# Patient Record
Sex: Female | Born: 1975 | Race: Black or African American | Hispanic: No | Marital: Married | Smoking: Former smoker
Health system: Southern US, Community
[De-identification: ages and names within clinical notes are randomized; demographics above are authoritative.]

## PROBLEM LIST (undated history)

## (undated) DIAGNOSIS — J386 Stenosis of larynx: Secondary | ICD-10-CM

## (undated) DIAGNOSIS — Z930 Tracheostomy status: Secondary | ICD-10-CM

## (undated) DIAGNOSIS — E079 Disorder of thyroid, unspecified: Secondary | ICD-10-CM

## (undated) DIAGNOSIS — D509 Iron deficiency anemia, unspecified: Secondary | ICD-10-CM

## (undated) DIAGNOSIS — D649 Anemia, unspecified: Secondary | ICD-10-CM

## (undated) DIAGNOSIS — E039 Hypothyroidism, unspecified: Secondary | ICD-10-CM

## (undated) DIAGNOSIS — J398 Other specified diseases of upper respiratory tract: Secondary | ICD-10-CM

## (undated) DIAGNOSIS — J45909 Unspecified asthma, uncomplicated: Secondary | ICD-10-CM

## (undated) DIAGNOSIS — E669 Obesity, unspecified: Secondary | ICD-10-CM

## (undated) DIAGNOSIS — C539 Malignant neoplasm of cervix uteri, unspecified: Secondary | ICD-10-CM

## (undated) HISTORY — PX: CARDIAC CATHETERIZATION: SHX172

## (undated) HISTORY — PX: LEG SURGERY: SHX1003

## (undated) HISTORY — PX: CHOLECYSTECTOMY: SHX55

## (undated) HISTORY — PX: CERVICAL BIOPSY  W/ LOOP ELECTRODE EXCISION: SUR135

## (undated) HISTORY — DX: Malignant neoplasm of cervix uteri, unspecified: C53.9

## (undated) HISTORY — PX: TUBAL LIGATION: SHX77

## (undated) NOTE — *Deleted (*Deleted)
PMR Admission Coordinator Pre-Admission Assessment  Patient: Kathy Vega is an 60 y.o., female MRN: [id number] DOB: 21-Sep-1976 Height: 5' 9.13" (175.6 cm) Weight: 110.5 kg  Insurance Information HMO:   PPO: Yes     PCP:       IPA:       80/20:       OTHER:  Group # [id number] PRIMARY: [place] medicaid/ BCBS Healthy Blue      Policy#: [id number]      Subscriber: patient CM Name: Raynelle Fanning      Phone#:      Fax#: [phone] Pre-Cert#: [id number] on 09/17/20 for 7 days with update due in 7 days      Employer: [employer] Benefits:  Phone #:       Name: Availity portal Eff. Date: 05/28/2020     Deduct: $0      Out of Pocket Max: $0      Life Max: N/A CIR: $0 co-pay      SNF: $0 do-pay Outpatient:       Co-Pay: $3 Home Health: medical necessity      Co-Pay: $0 co-pay DME: medical necessity     Co-Pay: $0 co-pay Providers: in network  SECONDARY:        Policy#:       Phone#:    Artist:        Phone#:    The Data processing manager" for patients in Inpatient Rehabilitation Facilities with attached "Privacy Act Statement-Health Care Records" was provided and verbally reviewed with: N/A  Emergency Contact Information Contact Information    Name Relation Home Work Mobile   Salmon R Spouse   [phone]   akya, fiorello Daughter   [phone]   Carron, Mcmurry Mother   [phone]      Current Medical History  Patient Admitting Diagnosis: Debility post Covid 19 PNA  History of Present Illness: A 53 yo female with a PMH of Hypothyroidism and Anemia.  She presented to [facility] ER on 09/16 with c/o shortness of breath, headache, and confusion.  Upon arrival to the ER pts O2 sats were in the 20's she was placed on 5L of O2 via nasal canula, however her O2 sats only increased to 29%.  She was diagnosed with COVID-19 6 days prior to ER presentation, and she is unvaccinated.  She was placed on NRB and heated HFNC with increase in O2 sats to 93-95%.  Pt also noted to be  confused and uncooperative likely secondary to taking a significant amount of cough syrup with codeine at home.  CT Head negative.  Lab results revealed K+ 3.4, glucose 139, creatinine 1.77, AST 333, ALT 108, troponin 40, crp 12.9, lactic acid 2.0, hgb 7.3, lactic acid 2.0, pct 0.65, d-dimer 1,365.88, abg 7.43/pCO2 47/pO2 54/acid-base excess 6.2, bicarb 31.2.  COVID-19 results pending, however CXR revealed bilateral infiltrates.  PCCM team contacted for ICU admission.  Was diagnosed with COVID-19 pneumonia and ARDS requiring intubation and mechanical ventilation. She is currently requiring trach and is medically stable for acute rehab. Currently with trach collar and unable to phonate. Physical Medicine & Rehabilitation was consulted to assess candidacy for CIR.   Patient's medical record from [facility] has been reviewed by the rehabilitation admission coordinator and physician.  Past Medical History  Past Medical History:  Diagnosis Date  . Anemia   . Thyroid disease     Family History   family history is not on file.  Prior Rehab/Hospitalizations Has the patient  had prior rehab or hospitalizations prior to admission? No  Has the patient had major surgery during 100 days prior to admission? Yes   Current Medications  Current Facility-Administered Medications:  .  0.9 %  sodium chloride infusion, 250 mL, Intravenous, Continuous, Erin Fulling, MD, Stopped at 09/07/20 0725 .  enoxaparin (LOVENOX) injection 55 mg, 0.5 mg/kg, Subcutaneous, Q24H, Albina Billet, RPH, 55 mg at 09/16/20 1545 .  feeding supplement (BOOST / RESOURCE BREEZE) liquid 1 Container, 1 Container, Oral, TID BM, Lynn Ito, MD, 1 Container at 09/15/20 1502 .  fluconazole (DIFLUCAN) tablet 100 mg, 100 mg, Oral, Q24H, Shanlever, Charmayne Sheer, RPH, 100 mg at 09/16/20 1654 .  insulin aspart (novoLOG) injection 0-20 Units, 0-20 Units, Subcutaneous, TID AC & HS, Amery, Sahar, MD .  MEDLINE mouth rinse,  15 mL, Mouth Rinse, q12n4p, Karna Christmas, Fuad, MD, 15 mL at 09/16/20 1740 .  nystatin (MYCOSTATIN) 100000 UNIT/ML suspension 500,000 Units, 5 mL, Oral, QID, Karna Christmas, Fuad, MD, 500,000 Units at 09/17/20 0849 .  ondansetron (ZOFRAN) tablet 4 mg, 4 mg, Oral, Q6H PRN **OR** ondansetron (ZOFRAN) injection 4 mg, 4 mg, Intravenous, Q6H PRN, Albina Billet, RPH .  pantoprazole (PROTONIX) EC tablet 40 mg, 40 mg, Oral, BID AC, Albina Billet, RPH, 40 mg at 09/17/20 0849 .  simethicone (MYLICON) 40 MG/0.6ML suspension 40 mg, 40 mg, Oral, Q6H PRN, Shanlever, Charmayne Sheer, RPH .  traZODone (DESYREL) tablet 100 mg, 100 mg, Oral, QHS, Shanlever, Charmayne Sheer, RPH, 100 mg at 09/16/20 2254  Patients Current Diet:  Diet Order            DIET DYS 3 Room service appropriate? Yes with Assist; Fluid consistency: Thin  Diet effective now                 Precautions / Restrictions Precautions Precautions: Fall Precaution Comments: Trach (trach collar); HOB >30 degrees; Dobhoff Restrictions Weight Bearing Restrictions: No   Has the patient had 2 or more falls or a fall with injury in the past year? No  Prior Activity Level Community (5-7x/wk): Went out daily as needed, was independent  Prior Functional Level Self Care: Did the patient need help bathing, dressing, using the toilet or eating? Independent  Indoor Mobility: Did the patient need assistance with walking from room to room (with or without device)? Independent  Stairs: Did the patient need assistance with internal or external stairs (with or without device)? Independent  Functional Cognition: Did the patient need help planning regular tasks such as shopping or remembering to take medications? Independent  Home Assistive Devices / Equipment Home Equipment: None  Prior Device Use: Indicate devices/aids used by the patient prior to current illness, exacerbation or injury? None of the above  Current Functional Level Cognition  Overall  Cognitive Status: Within Functional Limits for tasks assessed Orientation Level: Oriented X4 General Comments: Pt is able to communicate via white board and mouthing words. gives thumbs up/down appropriately    Extremity Assessment (includes Sensation/Coordination)  Upper Extremity Assessment: Generalized weakness  Lower Extremity Assessment: Defer to PT evaluation RLE Deficits / Details: hip flexion and knee flexion/extension 4/5; DF 4+/5; at least 3/5 PF LLE Deficits / Details: hip flexion and knee flexion/extension 4+/5; DF 4+/5; at least 3/5 PF    ADLs  Overall ADL's : Needs assistance/impaired Eating/Feeding: Supervision/ safety, Sitting Grooming: Wash/dry hands, Wash/dry face, Sitting, Set up, Supervision/safety Upper Body Bathing: Minimal assistance, Standing Upper Body Dressing : Moderate assistance, Bed level Lower Body Dressing: Maximal assistance,  Total assistance, Bed level General ADL Comments: Pt with gross strengh of 3-/5 throughout but fatigues quickly with self are tasks    Mobility  Overal bed mobility: Independent Bed Mobility: Supine to Sit, Sit to Supine Rolling: Mod assist Supine to sit: Supervision, HOB elevated Sit to supine: Supervision, HOB elevated General bed mobility comments: pt has just returned to bed from ZI'ly getting on/off BSc without assistances. discussed importance of calling for assistance for safety    Transfers  Overall transfer level: Needs assistance Equipment used: None Transfers: Sit to/from Stand Sit to Stand: Min guard Stand pivot transfers: Min assist General transfer comment: CGA for safety. vcs for improved technique and fwd wt shift. stood from bed at lowest height    Ambulation / Gait / Stairs / Wheelchair Mobility  Ambulation/Gait Ambulation/Gait assistance: Land (Feet): 200 Feet Assistive device: None Gait Pattern/deviations: Narrow base of support, Shuffle, Drifts right/left General Gait Details: pt  did have one occasuion of LOB with intervention to prevent fall. CGA throughout mostly with slow cadenace  Gait velocity: decreased    Posture / Balance Dynamic Sitting Balance Sitting balance - Comments: no LOB with close supervision Balance Overall balance assessment: Needs assistance Sitting-balance support: No upper extremity supported, Feet supported Sitting balance-Leahy Scale: Good Sitting balance - Comments: no LOB with close supervision Postural control: Posterior lean Standing balance support: No upper extremity supported, During functional activity Standing balance-Leahy Scale: Fair Standing balance comment: pt does have unsteadiness with dynamic activity     Special needs/care consideration Continuous Drip IV  0.9% NS KVO, Oxygen Trach collar, Trach size #6 extra long cuffed shiley trach, Skin Breakdown on bottom with dressing in place and Designated visitor Husband/daughters   Previous Home Environment (from acute therapy documentation) Living Arrangements: Spouse/significant other, Children (23,17,16 year olds) Available Help at Discharge: Family Type of Home: House Home Layout: One level Home Access: Level entry Bathroom Shower/Tub: Health visitor: Standard  Discharge Living Setting Plans for Discharge Living Setting: Patient's home, House, Lives with (comment) (LIves with spouse and children) Type of Home at Discharge: House Discharge Home Layout: One level Discharge Home Access: Level entry Discharge Bathroom Shower/Tub: Walk-in shower Discharge Bathroom Toilet: Standard Discharge Bathroom Accessibility: Yes How Accessible: Accessible via walker Does the patient have any problems obtaining your medications?: No  Social/Family/Support Systems Patient Roles: Spouse, Parent (Has husband and children) Contact Information: Lewanda Rife - spouse Anticipated Caregiver: husband Anticipated Caregiver's Contact Information: Lewanda Rife - spouse  - [phone] Ability/Limitations of Caregiver: Husband works.  He can get his 18yo or 21 yo daughters to stay with patient if needed. Caregiver Availability: Other (Comment) (Can get daughters to stay with patient at discharge.  ) Discharge Plan Discussed with Primary Caregiver: Yes Is Caregiver In Agreement with Plan?: Yes Does Caregiver/Family have Issues with Lodging/Transportation while Pt is in Rehab?: No  Goals Patient/Family Goal for Rehab: PT/OT/SLP mod I and supervision goals Expected length of stay: 7-10 days Cultural Considerations: None Pt/Family Agrees to Admission and willing to participate: Yes Program Orientation Provided & Reviewed with Pt/Caregiver Including Roles  & Responsibilities: Yes  Decrease burden of Care through IP rehab admission: N/A  Possible need for SNF placement upon discharge: Not anticipated  Patient Condition: This patient's medical and functional status has changed since the consult dated: 09/14/20 in which the Rehabilitation Physician determined and documented that the patient's condition is appropriate for intensive rehabilitative care in an inpatient rehabilitation facility. See "History of Present Illness" (  above) for medical update. Functional changes are: Curently min to minguard assist ambulating 160'. Patient's medical and functional status update has been discussed with the Rehabilitation physician and patient remains appropriate for inpatient rehabilitation. Will admit to inpatient rehab today.  Preadmission Screen Completed By:  Trish Mage, 09/17/2020 2:36 PM ______________________________________________________________________   Discussed status with Dr. Marland Kitchen on *** at *** and received approval for admission today.  Admission Coordinator:  Trish Mage, RN, time Marland KitchenDorna Bloom ***   Assessment/Plan: Diagnosis: 1. Does the need for close, 24 hr/day Medical supervision in concert with the patient's rehab needs make it unreasonable for  this patient to be served in a less intensive setting? {yes_no_potentially:3041433} 2. Co-Morbidities requiring supervision/potential complications: *** 3. Due to {due UJ:8119147}, does the patient require 24 hr/day rehab nursing? {yes_no_potentially:3041433} 4. Does the patient require coordinated care of a physician, rehab nurse, PT, OT, and SLP to address physical and functional deficits in the context of the above medical diagnosis(es)? {yes_no_potentially:3041433} Addressing deficits in the following areas: {deficits:3041436} 5. Can the patient actively participate in an intensive therapy program of at least 3 hrs of therapy 5 days a week? {yes_no_potentially:3041433} 6. The potential for patient to make measurable gains while on inpatient rehab is {potential:3041437} 7. Anticipated functional outcomes upon discharge from inpatient rehab: {functional outcomes:304600100} PT, {functional outcomes:304600100} OT, {functional outcomes:304600100} SLP 8. Estimated rehab length of stay to reach the above functional goals is: *** 9. Anticipated discharge destination: {anticipated dc setting:21604} 10. Overall Rehab/Functional Prognosis: {potential:3041437}   MD Signature: ***

---

## 2013-12-11 ENCOUNTER — Ambulatory Visit: Payer: Self-pay | Admitting: Physician Assistant

## 2014-03-03 ENCOUNTER — Ambulatory Visit: Payer: Self-pay | Admitting: Physician Assistant

## 2014-03-03 LAB — COMPREHENSIVE METABOLIC PANEL
Albumin: 4.2 g/dL (ref 3.4–5.0)
Alkaline Phosphatase: 50 U/L
Anion Gap: 5 — ABNORMAL LOW (ref 7–16)
BUN: 11 mg/dL (ref 7–18)
Bilirubin,Total: 0.3 mg/dL (ref 0.2–1.0)
Calcium, Total: 9.3 mg/dL (ref 8.5–10.1)
Chloride: 103 mmol/L (ref 98–107)
Co2: 32 mmol/L (ref 21–32)
Creatinine: 0.96 mg/dL (ref 0.60–1.30)
EGFR (African American): >60
EGFR (Non-African Amer.): >60
Glucose: 91 mg/dL (ref 65–99)
Osmolality: 278 (ref 275–301)
Potassium: 3.9 mmol/L (ref 3.5–5.1)
SGOT(AST): 26 U/L (ref 15–37)
SGPT (ALT): 24 U/L (ref 12–78)
Sodium: 140 mmol/L (ref 136–145)
Total Protein: 8 g/dL (ref 6.4–8.2)

## 2015-05-05 ENCOUNTER — Emergency Department
Admission: EM | Admit: 2015-05-05 | Discharge: 2015-05-05 | Disposition: A | Discharge status: Home / Self Care | Payer: Medicaid Other | Attending: Emergency Medicine | Admitting: Emergency Medicine

## 2015-05-05 ENCOUNTER — Encounter: Payer: Self-pay | Admitting: *Deleted

## 2015-05-05 ENCOUNTER — Emergency Department: Payer: Medicaid Other

## 2015-05-05 DIAGNOSIS — Y9389 Activity, other specified: Secondary | ICD-10-CM | POA: Insufficient documentation

## 2015-05-05 DIAGNOSIS — M7022 Olecranon bursitis, left elbow: Secondary | ICD-10-CM

## 2015-05-05 DIAGNOSIS — M25522 Pain in left elbow: Secondary | ICD-10-CM | POA: Diagnosis present

## 2015-05-05 MED ORDER — MELOXICAM 15 MG PO TABS: 15.0000 mg | ORAL_TABLET | Freq: Every day | ORAL | Status: DC | PRN

## 2015-05-05 MED ORDER — TRAMADOL HCL 50 MG PO TABS: 50.0000 mg | ORAL_TABLET | Freq: Three times a day (TID) | ORAL | Status: DC | PRN

## 2015-05-05 NOTE — ED Notes (Signed)
Pt reports left elbow pain for 2 months

## 2015-05-05 NOTE — ED Provider Notes (Signed)
[facility] Emergency Department Provider Note ____________________________________________  Time seen: Approximately 11:19 AM  I have reviewed the triage vital signs and the nursing notes.   HISTORY  Chief Complaint Elbow Pain   HPI Kathy Vega is a 39 y.o. female presents to the ER for complaints of left elbow pain. Patient states that she has had intermittent left, pain for approximately 2 months. Denies known fall or injury. However reports repetitive motions with upper arms throughout the day at work. 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  States intermittent swelling just at the elbow. Denies pain radiation. Denies numbness or tingling. States pain currently 5 out of 6 in aching. States pain is worse with palpation or movement.States feels better with rest and massage.   History reviewed. No pertinent past medical history.  There are no active problems to display for this patient.   No past surgical history on file.  No current outpatient prescriptions on file.  Allergies Review of patient's allergies indicates no known allergies.  No family history on file.  Social History History  Substance Use Topics  . Smoking status: Never Smoker   . Smokeless tobacco: Not on file  . Alcohol Use: No    Review of Systems Constitutional: No fever/chills Eyes: No visual changes. ENT: No sore throat. Cardiovascular: Denies chest pain. Respiratory: Denies shortness of breath. Gastrointestinal: No abdominal pain.  No nausea, no vomiting.  No diarrhea.  No constipation. Genitourinary: Negative for dysuria. Musculoskeletal: Negative for back pain. Positive for left elbow pain. Skin: Negative for rash. Neurological: Negative for headaches, focal weakness or numbness.  10-point ROS otherwise negative.  ____________________________________________   PHYSICAL EXAM:  VITAL SIGNS: ED Triage Vitals  Enc Vitals  Group     BP 05/05/15 1027 129/76 mmHg     Pulse Rate 05/05/15 1027 79     Resp 05/05/15 1027 18     Temp 05/05/15 1027 97.6 F (36.4 C)     Temp Source 05/05/15 1027 Oral     SpO2 05/05/15 1027 98 %     Weight 05/05/15 1027 220 lb (99.791 kg)     Height 05/05/15 1027 5\' 9"  (1.753 m)     Head Cir --      Peak Flow --      Pain Score 05/05/15 1038 8     Pain Loc --      Pain Edu? --      Excl. in [place]? --     Constitutional: Alert and oriented. Well appearing and in no acute distress. Eyes: Conjunctivae are normal. PERRL. EOMI. Head: Atraumatic. Nose: No congestion/rhinnorhea. Mouth/Throat: Mucous membranes are moist.  Oropharynx non-erythematous. Neck: No stridor.  No cervical spine tenderness to palpation. Hematological/Lymphatic/Immunilogical: No cervical lymphadenopathy. Cardiovascular: Normal rate, regular rhythm. Grossly normal heart sounds.  Good peripheral circulation. Respiratory: Normal respiratory effort.  No retractions. Lungs CTAB. Gastrointestinal: Soft and nontender. No distention.  Musculoskeletal: No lower extremity tenderness nor edema.  No joint effusions.  Left elbow mild to mod TTP at left olecranon, with mild swelling. NO erythema or fluctuance. No induration. Skin intact. Full ROM, pain increases with full flexion. Grips equal bilaterally.   Neurologic:  Normal speech and language. No gross focal neurologic deficits are appreciated. Speech is normal. No gait instability. Skin:  Skin is warm, dry and intact. No rash noted. Psychiatric: Mood and affect are normal. Speech and behavior are normal.   RADIOLOGY LEFT ELBOW -  COMPLETE 3+ VIEW  COMPARISON: None.  FINDINGS: There is no evidence of fracture, dislocation, or joint effusion. There is no evidence of arthropathy or other focal bone abnormality. Soft tissues are unremarkable.  IMPRESSION: No acute abnormality noted.   Electronically Signed By: Inez Catalina M.D. On: 05/05/2015  12:02 ______________________________________________________________________________________   INITIAL IMPRESSION / ASSESSMENT AND PLAN / ED COURSE  Pertinent labs & imaging results that were available during my care of the patient were reviewed by me and considered in my medical decision making (see chart for details).  Well appearing. No acute distress. Left elbow pain x 2 months. No known injury. Performs repetitive motions with forearms at work. Suspect olecranon bursitis. Treat with oral mobic and prn tramadol. Follow up with ortho PRN. Pt agreed to plan.  ____________________________________________   FINAL CLINICAL IMPRESSION(S) / ED DIAGNOSES  Final diagnoses:  Olecranon bursitis, left     Marylene Land, NP 05/05/15 1219  Orbie Pyo, MD 05/05/15 [phone]

## 2015-05-05 NOTE — Discharge Instructions (Signed)
Take medication as prescribed. Apply ice. Avoid strenuous activity and repetitive movements as able. Wear sling as needed for comfort. Stretch multiple times per day.   Follow up with orthopedic as needed for continued pain.   Return to ER for new or worsening concerns.   Olecranon Bursitis  with Rehab A bursa is a fluid-filled sac that is located between soft tissues (ligaments, tendons, skin) and bones. The purpose of a bursa is to allow the soft tissue to function smoothly, without friction. The olecranon bursa is located between the back of the elbow (olecranon) and the skin. Olecranon bursitis involves inflammation of this bursa, resulting in pain. SYMPTOMS   Pain, tenderness, swelling, warmth, or redness over the back of the elbow.  Reduced range of motion of the affected elbow.  Sometimes, severe pain with movement of the affected elbow.  Crackling sound (crepitation) when the bursa is moved or touched.  Often, painless swelling of the bursa.  Fever (when infected). CAUSES  Olecranon bursitis is often caused by direct hit (trauma) to the elbow. Less commonly, it is due to overuse and/or strenuous exercise that the elbow is not used to. RISK INCREASES WITH:  Sports that require bending or landing on the elbow (football, volleyball).  Vigorous or repetitive athletic training, or sudden increase or change in activity level (weekend warriors).  Failure to warm up properly before activity.  Poor exercise technique.  Playing on artificial turf. PREVENTION  Avoid injuries and the overuse of muscles whenever possible.  Warm up and stretch properly before activity.  Allow for adequate recovery between workouts.  Maintain physical fitness:  Strength, flexibility, and endurance.  Cardiovascular fitness.  Learn and use proper technique.  Wear properly fitted and padded protective equipment. PROGNOSIS  If treated properly, olecranon bursitis is usually curable within 2  weeks.  RELATED COMPLICATIONS   Longer healing time, if not properly treated or if not given enough time to heal.  Recurring symptoms that result in a chronic problem.  Joint stiffness with permanent limitation of the affected joint's movement.  Infection of the bursa.  Chronic inflammation or scarring of the bursa. TREATMENT Treatment first involves the use of ice and medicine to reduce pain and inflammation. The use of strengthening and stretching exercises may help reduce pain with activity. These exercises may be performed at home or with a therapist. Elbow pads may be advised to protect the bursa. If symptoms persist despite nonsurgical treatment, a procedure to withdraw fluid from the bursa may be advised. This procedure may be accompanied with an injection of corticosteroids to reduce inflammation. Sometimes, surgery is needed to remove the bursa. MEDICATION  If pain medicine is needed, nonsteroidal anti-inflammatory medicines (aspirin and ibuprofen), or other minor pain relievers (acetaminophen), are often advised.  Do not take pain medicine for 7 days before surgery.  Prescription pain relievers may be given, if your caregiver thinks they are needed. Use only as directed and only as much as you need.  Corticosteroid injections may be given by your caregiver. These injections should be reserved for the most serious cases, because they may only be given a certain number of times. HEAT AND COLD  Cold treatment (icing) should be applied for 10 to 15 minutes every 2 to 3 hours for inflammation and pain, and immediately after activity that aggravates your symptoms. Use ice packs or an ice massage.  Heat treatment may be used before performing stretching and strengthening activities prescribed by your caregiver, physical therapist, or athletic trainer. Use  a heat pack or a warm water soak. SEEK IMMEDIATE MEDICAL CARE IF:   Symptoms get worse or do not improve in 2 weeks, despite  treatment.  Signs of infection develop, including fever of 102 F (38.9 C), increased pain, redness, warmth, or pus draining from the bursa.  New, unexplained symptoms develop. (Drugs used in treatment may produce side effects.) EXERCISES  RANGE OF MOTION (ROM) AND STRETCHING EXERCISES - Olecranon Bursitis These exercises may help you when beginning to rehabilitate your injury. Your symptoms may resolve with or without further involvement from your physician, physical therapist or athletic trainer. While completing these exercises, remember:   Restoring tissue flexibility helps normal motion to return to the joints. This allows healthier, less painful movement and activity.  An effective stretch should be held for at least 30 seconds.  A stretch should never be painful. You should only feel a gentle lengthening or release in the stretched tissue. RANGE OF MOTION - Elbow Flexion, Supine  Lie on your back. Extend your right / left arm into the air, bracing it with your opposite hand. Allow your right / left arm to relax.  Let your elbow bend, allowing your hand to fall slowly toward your chest.  You should feel a gentle stretch along the back of your upper arm and elbow. Your physician, physical therapist or athletic trainer may ask you to hold a __________ hand weight to increase the intensity of this stretch.  Hold for __________ seconds. Slowly return your right / left arm to the upright position. Repeat __________ times. Complete this exercise __________ times per day. STRETCH - Elbow Flexors  Lie on a firm bed or countertop on your back. Be sure that you are in a comfortable position which will allow you to relax your arm muscles.  Place a folded towel under your right / left upper arm, so that your elbow and shoulder are at the same height. Extend your arm; your elbow should not rest on the bed or towel  Allow the weight of your hand to straighten your elbow. Keep your arm and  chest muscles relaxed. Your caregiver may ask you to increase the intensity of your stretch by adding a small wrist or hand weight.  Hold for __________ seconds. You should feel a stretch on the inside of your elbow. Slowly return to the starting position. Repeat __________ times. Complete this exercise __________ times per day. STRENGTHENING EXERCISES - Olecranon Bursitis These exercises will help you regain your strength. They may resolve your symptoms with or without further involvement from your physician, physical therapist or athletic trainer. While completing these exercises, remember:   Muscles can gain both the endurance and the strength needed for everyday activities through controlled exercises.  Complete these exercises as instructed by your physician, physical therapist or athletic trainer. Increase the resistance and repetitions only as guided by your caregiver.  You may experience muscle soreness or fatigue, but the pain or discomfort you are trying to eliminate should never worsen during these exercises. If this pain does worsen, stop and make certain you are following the directions exactly. If the pain is still present after adjustments, discontinue the exercise until you can discuss the trouble with your caregiver. STRENGTH - Elbow Extensors, Isometric  Stand or sit upright on a firm surface. Place your right / left arm so that your palm faces your stomach, and it is at the height of your waist.  Place your opposite hand on the underside of your forearm.  Gently push up as your right / left arm resists. Push as hard as you can with both arms without causing any pain or movement at your right / left elbow. Hold this stationary position for __________ seconds.  Gradually release the tension in both arms. Allow your muscles to relax completely before repeating. Repeat __________ times. Complete this exercise __________ times per day. STRENGTH - Elbow Flexors, Isometric  Stand or  sit upright on a firm surface. Place your right / left arm so that your hand is palm-up and at the height of your waist.  Place your opposite hand on top of your forearm. Gently push down as your right / left arm resists. Push as hard as you can with both arms without causing any pain or movement at your right / left elbow. Hold this stationary position for __________ seconds.  Gradually release the tension in both arms. Allow your muscles to relax completely before repeating. Repeat __________ times. Complete this exercise __________ times per day. STRENGTH - Elbow Flexors, Supinated  With good posture, stand or sit on a firm chair without armrests. Allow your right / left arm to rest at your side with your palm facing forward.  Holding a __________ weight, or gripping a rubber exercise band or tubing,  bring your hand toward your shoulder.  Allow your muscles to control the resistance as your hand returns to your side. Repeat __________ times. Complete this exercise __________ times per day.  STRENGTH - Elbow Flexors, Neutral  With good posture, stand or sit on a firm chair without armrests. Allow your right / left arm to rest at your side with your thumb facing forward.  Holding a __________weight, or gripping a rubber exercise band or tubing,  bring your hand toward your shoulder.  Allow your muscles to control the resistance as your hand returns to your side. Repeat __________ times. Complete this exercise __________ times per day.  STRENGTH - Elbow Extensors  Lie on your back. Extend your right / left elbow into the air, pointing it toward the ceiling. Brace your arm with your opposite hand.*  Holding a __________ weight in your hand, slowly straighten your right / left elbow.  Allow your muscles to control the weight as your hand returns to its starting position. Repeat __________ times. Complete this exercise __________ times per day. *You may also stand with your elbow  overhead and pointed toward the ceiling, supported by your opposite hand. STRENGTH - Elbow Extensors, Dynamic  With good posture, stand, or sit on a firm chair without armrests. Keeping your upper arms at your side, bring both hands up to your right / left shoulder while gripping a rubber exercise band or tubing. Your right / left hand should be just below the other hand.  Straighten your right / left elbow. Hold for __________ seconds.  Allow your muscles to control the rubber exercise band, as your hand returns to your shoulder. Repeat __________ times. Complete this exercise __________ times per day. Document Released: 11/14/2005 Document Revised: 03/31/2014 Document Reviewed: 02/26/2009 [facility] Patient Information 2015 [place], [place]. This information is not intended to replace advice given to you by your health care provider. Make sure you discuss any questions you have with your health care provider.

## 2015-11-10 ENCOUNTER — Encounter: Payer: Self-pay | Admitting: Emergency Medicine

## 2015-11-10 ENCOUNTER — Emergency Department
Admission: EM | Admit: 2015-11-10 | Discharge: 2015-11-10 | Disposition: A | Discharge status: Home / Self Care | Payer: Medicaid Other | Attending: Emergency Medicine | Admitting: Emergency Medicine

## 2015-11-10 ENCOUNTER — Emergency Department: Payer: Medicaid Other

## 2015-11-10 DIAGNOSIS — M1710 Unilateral primary osteoarthritis, unspecified knee: Secondary | ICD-10-CM

## 2015-11-10 DIAGNOSIS — E669 Obesity, unspecified: Secondary | ICD-10-CM | POA: Insufficient documentation

## 2015-11-10 DIAGNOSIS — M1711 Unilateral primary osteoarthritis, right knee: Secondary | ICD-10-CM | POA: Insufficient documentation

## 2015-11-10 MED ORDER — MELOXICAM 7.5 MG PO TABS: 7.5000 mg | ORAL_TABLET | Freq: Every day | ORAL | Status: DC

## 2015-11-10 NOTE — Discharge Instructions (Signed)

## 2015-11-10 NOTE — ED Notes (Signed)
Having right knee pain for about 2 weeks w/o injury ..states she has had surgery to left knee .ambulates well to treatment area

## 2015-11-10 NOTE — ED Provider Notes (Signed)
CSN: [id number]     Arrival date & time 11/10/15  [id number] History   First MD Initiated Contact with Patient 11/10/15 [phone]     Chief Complaint  Patient presents with  . Knee Pain     HPI Comments: 39 year old female presents today complaining of right knee pain for the past 2 weeks. She does not recall an acute injury. She has not been taking anything for the pain. She has never had a surgery on the knee or an old injury. Patient reports that in her job she is on her feet all day and it hurts the longer she is standing on it. At times it locks and it is difficult to extend the leg. It does not give way when she is walking. Hasn't taken anything for the pain.  The history is provided by the patient.    History reviewed. No pertinent past medical history. Past Surgical History  Procedure Laterality Date  . Leg surgery     No family history on file. Social History  Substance Use Topics  . Smoking status: Never Smoker   . Smokeless tobacco: None  . Alcohol Use: No   OB History    No data available     Review of Systems  Musculoskeletal: Positive for arthralgias.  All other systems reviewed and are negative.     Allergies  Review of patient's allergies indicates no known allergies.  Home Medications   Prior to Admission medications   Medication Sig Start Date End Date Taking? Authorizing Provider  meloxicam (MOBIC) 7.5 MG tablet Take 1 tablet (7.5 mg total) by mouth daily. 11/10/15 11/09/16  Shayne Alken V, PA-C   BP 127/77 mmHg  Pulse 97  Temp(Src) 97.6 F (36.4 C) (Oral)  Resp 18  Ht 5\' 9"  (1.753 m)  Wt 108.863 kg  BMI 35.43 kg/m2  SpO2 100%  LMP 11/10/2015 Physical Exam  Constitutional: She is oriented to person, place, and time. She appears well-developed and well-nourished.  Obese  HENT:  Head: Normocephalic and atraumatic.  Cardiovascular: Intact distal pulses.   Musculoskeletal: Normal range of motion. She exhibits tenderness.       Right hip: Normal.    Left hip: Normal.       Right knee: She exhibits bony tenderness. She exhibits normal range of motion, no swelling, no effusion, normal alignment, no LCL laxity, normal patellar mobility and normal meniscus. Tenderness found. Medial joint line tenderness noted. No patellar tendon tenderness noted.       Left knee: Normal.  Negative Lachman's, negative anterior and posterior drawer. Negative McMurray's test.  Neurological: She is alert and oriented to person, place, and time.  Skin: Skin is warm and dry.  Psychiatric: She has a normal mood and affect. Her behavior is normal. Judgment and thought content normal.  Nursing note and vitals reviewed.   ED Course  Procedures (including critical care time) Labs Review Labs Reviewed - No data to display  Imaging Review Dg Knee Complete 4 Views Right  11/10/2015  CLINICAL DATA:  Right knee pain for 2 weeks. EXAM: RIGHT KNEE - COMPLETE 4+ VIEW COMPARISON:  None. FINDINGS: The patella is high riding. No evidence for a patellar fracture. Mild spurring and degenerative changes at the patellofemoral compartment. No evidence for acute fracture, dislocation or large joint effusion. IMPRESSION: No acute bone abnormality. Patella [place]ta. Electronically Signed   By: Markus Daft M.D.   On: 11/10/2015 09:28   I have personally reviewed and evaluated these images and lab  results as part of my medical decision-making.   EKG Interpretation None      MDM  X-ray to evaluate pain Arthritis in patellofemoral compartment RX for mobic daily Follow up with PCP for further care Final diagnoses:  Arthritis of knee        Corliss Parish, PA-C 11/10/15 [place], MD 11/10/15 [phone]

## 2015-11-10 NOTE — ED Notes (Signed)
Pt here with right knee pain that began 2 weeks ago, denies injury to area.

## 2016-04-18 ENCOUNTER — Emergency Department
Admission: EM | Admit: 2016-04-18 | Discharge: 2016-04-18 | Disposition: A | Discharge status: Home / Self Care | Payer: Medicaid Other | Attending: Emergency Medicine | Admitting: Emergency Medicine

## 2016-04-18 DIAGNOSIS — Y939 Activity, unspecified: Secondary | ICD-10-CM | POA: Insufficient documentation

## 2016-04-18 DIAGNOSIS — X58XXXA Exposure to other specified factors, initial encounter: Secondary | ICD-10-CM | POA: Insufficient documentation

## 2016-04-18 DIAGNOSIS — Y929 Unspecified place or not applicable: Secondary | ICD-10-CM | POA: Insufficient documentation

## 2016-04-18 DIAGNOSIS — S39012A Strain of muscle, fascia and tendon of lower back, initial encounter: Secondary | ICD-10-CM | POA: Insufficient documentation

## 2016-04-18 DIAGNOSIS — Y999 Unspecified external cause status: Secondary | ICD-10-CM | POA: Insufficient documentation

## 2016-04-18 LAB — URINALYSIS COMPLETE WITH MICROSCOPIC (ARMC ONLY)
BACTERIA UA: NONE SEEN
Bilirubin Urine: NEGATIVE
GLUCOSE, UA: NEGATIVE mg/dL
Hgb urine dipstick: NEGATIVE
Ketones, ur: NEGATIVE mg/dL
Leukocytes, UA: NEGATIVE
Nitrite: NEGATIVE
PROTEIN: NEGATIVE mg/dL
RBC / HPF: NONE SEEN RBC/hpf (ref 0–5)
SPECIFIC GRAVITY, URINE: 1.017 (ref 1.005–1.030)
pH: 5 (ref 5.0–8.0)

## 2016-04-18 LAB — POCT PREGNANCY, URINE: PREG TEST UR: NEGATIVE

## 2016-04-18 MED ORDER — IBUPROFEN 600 MG PO TABS: 600.0000 mg | ORAL_TABLET | Freq: Four times a day (QID) | ORAL | Status: DC | PRN

## 2016-04-18 MED ORDER — CYCLOBENZAPRINE HCL 10 MG PO TABS: 10.0000 mg | ORAL_TABLET | Freq: Three times a day (TID) | ORAL | Status: DC | PRN

## 2016-04-18 NOTE — ED Provider Notes (Signed)
[facility] Emergency Department Provider Note ____________________________________________  Time seen: Approximately 9:00 AM  I have reviewed the triage vital signs and the nursing notes.   HISTORY  Chief Complaint No chief complaint on file.    HPI Kathy Vega is a 40 y.o. female who presents to the ER for left side back pain x 3 weeks. She has not taken anything at home for this pain. She denies urinary discomfort or history of back pain. She denies recent injury.  No past medical history on file.  There are no active problems to display for this patient.   Past Surgical History  Procedure Laterality Date  . Leg surgery      Current Outpatient Rx  Name  Route  Sig  Dispense  Refill  . cyclobenzaprine (FLEXERIL) 10 MG tablet   Oral   Take 1 tablet (10 mg total) by mouth 3 (three) times daily as needed for muscle spasms.   30 tablet   0   . ibuprofen (ADVIL,MOTRIN) 600 MG tablet   Oral   Take 1 tablet (600 mg total) by mouth every 6 (six) hours as needed.   30 tablet   0   . meloxicam (MOBIC) 7.5 MG tablet   Oral   Take 1 tablet (7.5 mg total) by mouth daily.   15 tablet   0     Allergies Review of patient's allergies indicates no known allergies.  No family history on file.  Social History Social History  Substance Use Topics  . Smoking status: Never Smoker   . Smokeless tobacco: Not on file  . Alcohol Use: No    Review of Systems Constitutional: No recent illness. Cardiovascular: Denies chest pain or palpitations. Respiratory: Denies shortness of breath. Musculoskeletal: Pain in left lower back. Skin: Negative for rash, wound, lesion. Neurological: Negative for focal weakness or numbness.  ____________________________________________   PHYSICAL EXAM:  VITAL SIGNS: ED Triage Vitals  Enc Vitals Group     BP 04/18/16 0857 137/78 mmHg     Pulse Rate 04/18/16 0857 76     Resp 04/18/16 0857 20     Temp 04/18/16  0857 98.8 F (37.1 C)     Temp Source 04/18/16 0857 Oral     SpO2 04/18/16 0857 100 %     Weight 04/18/16 0857 230 lb (104.327 kg)     Height 04/18/16 0857 5\' 9"  (1.753 m)     Head Cir --      Peak Flow --      Pain Score 04/18/16 1120 3     Pain Loc --      Pain Edu? --      Excl. in [place]? --     Constitutional: Alert and oriented. Well appearing and in no acute distress. Eyes: Conjunctivae are normal. EOMI. Head: Atraumatic. Neck: No stridor.  Respiratory: Normal respiratory effort.   Musculoskeletal: No motion restrictions of the back or extremities.  Neurologic:  Normal speech and language. No gross focal neurologic deficits are appreciated. Speech is normal. No gait instability. Skin:  Skin is warm, dry and intact. Atraumatic. Psychiatric: Mood and affect are normal. Speech and behavior are normal.  ____________________________________________   LABS (all labs ordered are listed, but only abnormal results are displayed)  Labs Reviewed  URINALYSIS COMPLETEWITH MICROSCOPIC ([facility] ONLY) - Abnormal; Notable for the following:    Color, Urine STRAW (*)    APPearance CLEAR (*)    Squamous Epithelial / LPF 0-5 (*)  All other components within normal limits  POCT PREGNANCY, URINE   ____________________________________________  RADIOLOGY  Not indicated. ____________________________________________   PROCEDURES  Procedure(s) performed: None   ____________________________________________   INITIAL IMPRESSION / ASSESSMENT AND PLAN / ED COURSE  Pertinent labs & imaging results that were available during my care of the patient were reviewed by me and considered in my medical decision making (see chart for details).  Patient advised to follow up with the PCP for symptoms that are not relieved by Flexeril and ibuprofen. She was advised to return to the ER for symptoms that change or worsen if unable to schedule an  appointment. ____________________________________________   FINAL CLINICAL IMPRESSION(S) / ED DIAGNOSES  Final diagnoses:  Lumbosacral strain, initial encounter       Victorino Dike, FNP 04/18/16 1550  Eula Listen, MD 04/18/16 1557

## 2016-08-30 ENCOUNTER — Emergency Department
Admission: EM | Admit: 2016-08-30 | Discharge: 2016-08-30 | Disposition: A | Discharge status: Home / Self Care | Payer: Medicaid Other | Attending: Emergency Medicine | Admitting: Emergency Medicine

## 2016-08-30 ENCOUNTER — Encounter: Payer: Self-pay | Admitting: Emergency Medicine

## 2016-08-30 DIAGNOSIS — R110 Nausea: Secondary | ICD-10-CM | POA: Insufficient documentation

## 2016-08-30 DIAGNOSIS — R1013 Epigastric pain: Secondary | ICD-10-CM | POA: Insufficient documentation

## 2016-08-30 DIAGNOSIS — R51 Headache: Secondary | ICD-10-CM | POA: Insufficient documentation

## 2016-08-30 LAB — COMPREHENSIVE METABOLIC PANEL
ALT: 9 U/L — ABNORMAL LOW (ref 14–54)
ANION GAP: 4 — AB (ref 5–15)
AST: 19 U/L (ref 15–41)
Albumin: 4.1 g/dL (ref 3.5–5.0)
Alkaline Phosphatase: 33 U/L — ABNORMAL LOW (ref 38–126)
BILIRUBIN TOTAL: 0.6 mg/dL (ref 0.3–1.2)
BUN: 6 mg/dL (ref 6–20)
CALCIUM: 8.8 mg/dL — AB (ref 8.9–10.3)
CO2: 28 mmol/L (ref 22–32)
Chloride: 107 mmol/L (ref 101–111)
Creatinine, Ser: 0.79 mg/dL (ref 0.44–1.00)
GFR calc Af Amer: >60 mL/min (ref >60)
Glucose, Bld: 92 mg/dL (ref 65–99)
POTASSIUM: 3.7 mmol/L (ref 3.5–5.1)
Sodium: 139 mmol/L (ref 135–145)
TOTAL PROTEIN: 7.3 g/dL (ref 6.5–8.1)

## 2016-08-30 LAB — POCT PREGNANCY, URINE: PREG TEST UR: NEGATIVE

## 2016-08-30 LAB — URINALYSIS COMPLETE WITH MICROSCOPIC (ARMC ONLY)
BACTERIA UA: NONE SEEN
Bilirubin Urine: NEGATIVE
Glucose, UA: NEGATIVE mg/dL
HGB URINE DIPSTICK: NEGATIVE
Ketones, ur: NEGATIVE mg/dL
LEUKOCYTES UA: NEGATIVE
NITRITE: NEGATIVE
PROTEIN: NEGATIVE mg/dL
SPECIFIC GRAVITY, URINE: 1.024 (ref 1.005–1.030)
pH: 5 (ref 5.0–8.0)

## 2016-08-30 LAB — CBC
HEMATOCRIT: 20.8 % — AB (ref 35.0–47.0)
HEMOGLOBIN: 6.5 g/dL — AB (ref 12.0–16.0)
MCH: 18.2 pg — ABNORMAL LOW (ref 26.0–34.0)
MCHC: 31.2 g/dL — ABNORMAL LOW (ref 32.0–36.0)
MCV: 58.3 fL — ABNORMAL LOW (ref 80.0–100.0)
Platelets: 395 10*3/uL (ref 150–440)
RBC: 3.57 MIL/uL — ABNORMAL LOW (ref 3.80–5.20)
RDW: 20.7 % — AB (ref 11.5–14.5)
WBC: 4.4 10*3/uL (ref 3.6–11.0)

## 2016-08-30 LAB — LIPASE, BLOOD: Lipase: 22 U/L (ref 11–51)

## 2016-08-30 MED ORDER — GI COCKTAIL ~~LOC~~
30.0000 mL | Freq: Once | ORAL | Status: AC
  Administered 2016-08-30: 30 mL via ORAL
  Filled 2016-08-30: qty 30

## 2016-08-30 NOTE — Discharge Instructions (Signed)
Take the Zantac one a day. Return for worse pain fever vomiting. Follow-up with your regular doctor in the next few weeks.

## 2016-08-30 NOTE — ED Triage Notes (Signed)
Mid epigastric pain.  Also headache

## 2016-08-30 NOTE — ED Notes (Signed)
Pt given apple juice to drink

## 2016-08-30 NOTE — ED Notes (Signed)
Discharge instructions reviewed with patient. Questions fielded by this RN. Patient verbalizes understanding of instructions. Patient discharged home in stable condition per [facility] MD . No acute distress noted at time of discharge.

## 2016-08-30 NOTE — ED Notes (Signed)
MD made aware.

## 2016-08-30 NOTE — ED Provider Notes (Signed)
[facility] Emergency Department Provider Note   ____________________________________________   First MD Initiated Contact with Patient 08/30/16 1134     (approximate)  I have reviewed the triage vital signs and the nursing notes.   HISTORY  Chief Complaint Abdominal Pain    HPI Kathy Vega is a 40 y.o. female reports 2-3 days of epigastric pain sharp comes last for a while radiates to the back and goes away.  It is moderate in intensity. Nothing she seems to do seems to make it any better or worse including eating. He can be associated with nausea. Never had this before. He has had anemia before.   History reviewed. No pertinent past medical history.  There are no active problems to display for this patient.   Past Surgical History:  Procedure Laterality Date  . LEG SURGERY      Prior to Admission medications   Not on File    Allergies Review of patient's allergies indicates no known allergies.  No family history on file.  Social History Social History  Substance Use Topics  . Smoking status: Never Smoker  . Smokeless tobacco: Never Used  . Alcohol use No    Review of Systems Constitutional: No fever/chills Eyes: No visual changes. ENT: No sore throat. Cardiovascular: Denies chest pain. Respiratory: Denies shortness of breath. Gastrointestinal:  abdominal pain. nausea, no vomiting.  No diarrhea.  No constipation. Genitourinary: Negative for dysuria. Musculoskeletal: Negative for back pain. Skin: Negative for rash. Neurological: Negative for headaches, focal weakness or numbness.  10-point ROS otherwise negative.  ____________________________________________   PHYSICAL EXAM:  VITAL SIGNS: ED Triage Vitals  Enc Vitals Group     BP 08/30/16 1026 115/76     Pulse Rate 08/30/16 1026 76     Resp 08/30/16 1026 16     Temp 08/30/16 1026 97.8 F (36.6 C)     Temp Source 08/30/16 1026 Oral     SpO2 08/30/16 1026 100 %   Weight 08/30/16 1027 210 lb (95.3 kg)     Height 08/30/16 1027 5\' 9"  (1.753 m)     Head Circumference --      Peak Flow --      Pain Score 08/30/16 1027 8     Pain Loc --      Pain Edu? --      Excl. in [place]? --     Constitutional: Alert and oriented. Well appearing and in no acute distress. Eyes: Conjunctivae are normal. PERRL. EOMI. Head: Atraumatic. Nose: No congestion/rhinnorhea. Mouth/Throat: Mucous membranes are moist.  Oropharynx non-erythematous. Neck: No stridor.  Cardiovascular: Normal rate, regular rhythm. Grossly normal heart sounds.  Good peripheral circulation. Respiratory: Normal respiratory effort.  No retractions. Lungs CTAB. Gastrointestinal: Soft and nontender Except for in the epigastrium where there is some tenderness to palpation. No distention. No abdominal bruits. No CVA tenderness. Musculoskeletal: No lower extremity tenderness nor edema.  No joint effusions. Neurologic:  Normal speech and language. No gross focal neurologic deficits are appreciated. No gait instability. Skin:  Skin is warm, dry and intact. No rash noted. Psychiatric: Mood and affect are normal. Speech and behavior are normal.  ____________________________________________   LABS (all labs ordered are listed, but only abnormal results are displayed)  Labs Reviewed  COMPREHENSIVE METABOLIC PANEL - Abnormal; Notable for the following:       Result Value   Calcium 8.8 (*)    ALT 9 (*)    Alkaline Phosphatase 33 (*)  Anion gap 4 (*)    All other components within normal limits  CBC - Abnormal; Notable for the following:    RBC 3.57 (*)    Hemoglobin 6.5 (*)    HCT 20.8 (*)    MCV 58.3 (*)    MCH 18.2 (*)    MCHC 31.2 (*)    RDW 20.7 (*)    All other components within normal limits  URINALYSIS COMPLETEWITH MICROSCOPIC ([facility] ONLY) - Abnormal; Notable for the following:    Color, Urine YELLOW (*)    APPearance CLEAR (*)    Squamous Epithelial / LPF 0-5 (*)    All other components  within normal limits  LIPASE, BLOOD  POC URINE PREG, ED  POCT PREGNANCY, URINE   ____________________________________________  EKG  KG read and interpreted by me shows normal sinus rhythm rate of 79 normal axis decreased amplitude nonspecific ST-T wave changes ____________________________________________  RADIOLOGY   ____________________________________________   PROCEDURES  Procedure(s) performed: Pain resolved with GI cocktail.  Procedures  Critical Care performed: ____________________________________________   INITIAL IMPRESSION / ASSESSMENT AND PLAN / ED COURSE  Pertinent labs & imaging results that were available during my care of the patient were reviewed by me and considered in my medical decision making (see chart for details).    Clinical Course     ____________________________________________   FINAL CLINICAL IMPRESSION(S) / ED DIAGNOSES  Final diagnoses:  Epigastric pain      NEW MEDICATIONS STARTED DURING THIS VISIT:  New Prescriptions   No medications on file     Note:  This document was prepared using Dragon voice recognition software and may include unintentional dictation errors.    [name], MD 08/30/16 [phone]

## 2017-04-03 ENCOUNTER — Emergency Department
Admission: EM | Admit: 2017-04-03 | Discharge: 2017-04-03 | Disposition: A | Discharge status: Home / Self Care | Payer: Medicaid Other | Attending: Emergency Medicine | Admitting: Emergency Medicine

## 2017-04-03 ENCOUNTER — Encounter: Payer: Self-pay | Admitting: Emergency Medicine

## 2017-04-03 ENCOUNTER — Emergency Department: Payer: Medicaid Other

## 2017-04-03 DIAGNOSIS — M7989 Other specified soft tissue disorders: Secondary | ICD-10-CM | POA: Insufficient documentation

## 2017-04-03 DIAGNOSIS — D649 Anemia, unspecified: Secondary | ICD-10-CM

## 2017-04-03 LAB — BASIC METABOLIC PANEL
Anion gap: 6 (ref 5–15)
BUN: 11 mg/dL (ref 6–20)
CALCIUM: 9.1 mg/dL (ref 8.9–10.3)
CO2: 26 mmol/L (ref 22–32)
CREATININE: 0.77 mg/dL (ref 0.44–1.00)
Chloride: 103 mmol/L (ref 101–111)
GFR calc non Af Amer: >60 mL/min (ref >60)
Glucose, Bld: 87 mg/dL (ref 65–99)
Potassium: 3.9 mmol/L (ref 3.5–5.1)
Sodium: 135 mmol/L (ref 135–145)

## 2017-04-03 LAB — URINALYSIS, COMPLETE (UACMP) WITH MICROSCOPIC
BILIRUBIN URINE: NEGATIVE
Bacteria, UA: NONE SEEN
Glucose, UA: NEGATIVE mg/dL
Hgb urine dipstick: NEGATIVE
Ketones, ur: NEGATIVE mg/dL
LEUKOCYTES UA: NEGATIVE
Nitrite: NEGATIVE
PH: 7 (ref 5.0–8.0)
Protein, ur: NEGATIVE mg/dL
RBC / HPF: NONE SEEN RBC/hpf (ref 0–5)
SPECIFIC GRAVITY, URINE: 1.011 (ref 1.005–1.030)
WBC, UA: NONE SEEN WBC/hpf (ref 0–5)

## 2017-04-03 LAB — CBC
HCT: 23.7 % — ABNORMAL LOW (ref 35.0–47.0)
Hemoglobin: 7.3 g/dL — ABNORMAL LOW (ref 12.0–16.0)
MCH: 19.2 pg — AB (ref 26.0–34.0)
MCHC: 30.9 g/dL — AB (ref 32.0–36.0)
MCV: 62.1 fL — AB (ref 80.0–100.0)
PLATELETS: 286 10*3/uL (ref 150–440)
RBC: 3.82 MIL/uL (ref 3.80–5.20)
RDW: 23.9 % — ABNORMAL HIGH (ref 11.5–14.5)
WBC: 4.7 10*3/uL (ref 3.6–11.0)

## 2017-04-03 LAB — TROPONIN I: Troponin I: <0.03 ng/mL (ref <0.03)

## 2017-04-03 LAB — POC URINE PREG, ED: PREG TEST UR: NEGATIVE

## 2017-04-03 LAB — BRAIN NATRIURETIC PEPTIDE: B NATRIURETIC PEPTIDE 5: 28 pg/mL (ref 0.0–100.0)

## 2017-04-03 MED ORDER — FUROSEMIDE 40 MG PO TABS
20.0000 mg | ORAL_TABLET | Freq: Once | ORAL | Status: AC
  Administered 2017-04-03: 20 mg via ORAL
  Filled 2017-04-03: qty 1

## 2017-04-03 MED ORDER — FERROUS SULFATE DRIED ER 160 (50 FE) MG PO TBCR: 1.0000 | EXTENDED_RELEASE_TABLET | Freq: Every day | ORAL | 1 refills | Status: DC

## 2017-04-03 MED ORDER — POTASSIUM CHLORIDE ER 10 MEQ PO TBCR: 10.0000 meq | EXTENDED_RELEASE_TABLET | Freq: Every day | ORAL | 0 refills | Status: DC

## 2017-04-03 MED ORDER — FUROSEMIDE 20 MG PO TABS: 20.0000 mg | ORAL_TABLET | Freq: Every day | ORAL | 0 refills | Status: DC

## 2017-04-03 NOTE — Discharge Instructions (Addendum)
We ask you follow closely with cardiology for your leg swelling, if you have any increased swelling, chest, shortness of breath, or you feel worse in any way return to the emergency department. We also feel that you  should follow closely with OB/GYN because of your anemia, which you have had for several years and appears. This is likely because you have significant menstrual periods and you should follow up with OB. However, as discussed, without doing a rectal exam, we cannot rule out bleeding from your bottom as you know. If you have any black or bloody stool or you feel worse in any way including lightheadedness etc. as discussed please return to the emergency department.

## 2017-04-03 NOTE — ED Triage Notes (Signed)
Pt with bilateral non-pitting pedal edema x3 days. Pt reports intermittent calf pain.

## 2017-04-03 NOTE — ED Notes (Signed)

## 2017-04-03 NOTE — ED Notes (Signed)
Pt refuse preg d/t tubal ligation. Pt to follow up with  OB. Verbalizes understanding.

## 2017-04-03 NOTE — ED Provider Notes (Signed)
[facility] Emergency Department Provider Note  ____________________________________________   I have reviewed the triage vital signs and the nursing notes.   HISTORY  Chief Complaint Foot Swelling    HPI Kathy Vega is a 41 y.o. female states that she has chronic lower extremity swelling which is worse when she stands up for long periods at her job, as she usually does. She also has chronic anemia. She states that she has not been feeling lightheaded she denies chest pain she denies shortness of breath. She does have edema and is worse over the last 3 days as she was on her feet more than normal. She denies any fever or chills. No pleuritic or any other kind of chest pain. She denies dyspnea. Patient states that she does have very heavy. Sometimes sometimes using 2 pads at a time, she is not currently on her menstrual. She denies pregnancy. She states thatshe has been on iron pills is no longer taking them. Last hemoglobin was just over 6, less than a year ago. Has not had it followed up. Does go to a clinic. Denies any rectal bleeding or bright red blood per rectum or melena.    History reviewed. No pertinent past medical history.  There are no active problems to display for this patient.   Past Surgical History:  Procedure Laterality Date  . LEG SURGERY      Prior to Admission medications   Not on File    Allergies Patient has no known allergies.  No family history on file.  Social History Social History  Substance Use Topics  . Smoking status: Never Smoker  . Smokeless tobacco: Never Used  . Alcohol use No    Review of Systems Constitutional: No fever/chills Eyes: No visual changes. ENT: No sore throat. No stiff neck no neck pain Cardiovascular: Denies chest pain. Respiratory: Denies shortness of breath. Gastrointestinal:   no vomiting.  No diarrhea.  No constipation. Genitourinary: Negative for dysuria. Musculoskeletal: Negative  lower extremity swelling Skin: Negative for rash. Neurological: Negative for severe headaches, focal weakness or numbness. 10-point ROS otherwise negative.  ____________________________________________   PHYSICAL EXAM:  VITAL SIGNS: ED Triage Vitals [04/03/17 1011]  Enc Vitals Group     BP 129/80     Pulse Rate 74     Resp 16     Temp 98.4 F (36.9 C)     Temp Source Oral     SpO2 100 %     Weight 190 lb (86.2 kg)     Height 5\' 9"  (1.753 m)     Head Circumference      Peak Flow      Pain Score 8     Pain Loc      Pain Edu?      Excl. in [place]?     Constitutional: Alert and oriented. Well appearing and in no acute distress. Eyes: Conjunctivae are normal. PERRL. EOMI. Head: Atraumatic. Nose: No congestion/rhinnorhea. Mouth/Throat: Mucous membranes are moist.  Oropharynx non-erythematous. Neck: No stridor.   Nontender with no meningismus Cardiovascular: Normal rate, regular rhythm. Grossly normal heart sounds.  Good peripheral circulation. Respiratory: Normal respiratory effort.  No retractions. Lungs CTAB. Abdominal: Soft and nontender. No distention. No guarding no rebound Back:  There is no focal tenderness or step off.  there is no midline tenderness there are no lesions noted. there is no CVA tenderness Musculoskeletal: No lower extremity tenderness, no upper extremity tenderness. No joint effusions, no DVT signs strong distal  pulses positive symmetric bilateral 1+ pitting edema noted Neurologic:  Normal speech and language. No gross focal neurologic deficits are appreciated.  Skin:  Skin is warm, dry and intact. No rash noted. Psychiatric: Mood and affect are normal. Speech and behavior are normal.  ____________________________________________   LABS (all labs ordered are listed, but only abnormal results are displayed)  Labs Reviewed  CBC - Abnormal; Notable for the following:       Result Value   Hemoglobin 7.3 (*)    HCT 23.7 (*)    MCV 62.1 (*)    MCH 19.2  (*)    MCHC 30.9 (*)    RDW 23.9 (*)    All other components within normal limits  URINALYSIS, COMPLETE (UACMP) WITH MICROSCOPIC - Abnormal; Notable for the following:    Color, Urine STRAW (*)    APPearance CLEAR (*)    Squamous Epithelial / LPF 0-5 (*)    All other components within normal limits  BASIC METABOLIC PANEL  BRAIN NATRIURETIC PEPTIDE  TROPONIN I   ____________________________________________  EKG  I personally interpreted any EKGs ordered by me or triage Sinus rhythm rate 60 bpm no acute ST elevation or acute ST depression normal axis, QTC is prolonged prolonged at 515,  no acute ischemic changes ____________________________________________  RADIOLOGY  I reviewed any imaging ordered by me or triage that were performed during my shift and, if possible, patient and/or family made aware of any abnormal findings. ____________________________________________   PROCEDURES  Procedure(s) performed: None  Procedures  Critical Care performed: None  ____________________________________________   INITIAL IMPRESSION / ASSESSMENT AND PLAN / ED COURSE  Pertinent labs & imaging results that were available during my care of the patient were reviewed by me and considered in my medical decision making (see chart for details).  I did offer the patient a rectal exam advised her without rectal exam I cannot rule out imperceptible GI losses a cause of her anemia. She declines to have that done. She states she notices her menstrual periods. She does have chronic lower extremity swelling which she was be worse over the last few days while standing. No evidence of decompensated CHF. BNP is normal EKG doesn't show any ischemic changes no evidence of dysrhythmia lungs are clear, vital signs are reassuring. We will give her Lasix to see if we can take off some of the fluid, QTC is long, we have advised her she must follow closely with cardiology for further evaluation of her leg swelling.  We will also have her follow up with OB/GYN for her anemia. I'll start her on iron pills. Return precautions and follow-up given and understood.    ____________________________________________   FINAL CLINICAL IMPRESSION(S) / ED DIAGNOSES  Final diagnoses:  None      This chart was dictated using voice recognition software.  Despite best efforts to proofread,  errors can occur which can change meaning.      Schuyler Amor, MD 04/03/17 1440

## 2017-04-03 NOTE — ED Notes (Signed)
Pt changed mind at d/c and provided urine for pregnancy.

## 2017-08-17 ENCOUNTER — Emergency Department
Admission: EM | Admit: 2017-08-17 | Discharge: 2017-08-17 | Disposition: A | Discharge status: Home / Self Care | Payer: Self-pay | Attending: Emergency Medicine | Admitting: Emergency Medicine

## 2017-08-17 ENCOUNTER — Encounter: Payer: Self-pay | Admitting: Emergency Medicine

## 2017-08-17 ENCOUNTER — Emergency Department: Payer: Self-pay

## 2017-08-17 DIAGNOSIS — R600 Localized edema: Secondary | ICD-10-CM

## 2017-08-17 DIAGNOSIS — R2243 Localized swelling, mass and lump, lower limb, bilateral: Secondary | ICD-10-CM | POA: Insufficient documentation

## 2017-08-17 DIAGNOSIS — Z79899 Other long term (current) drug therapy: Secondary | ICD-10-CM | POA: Insufficient documentation

## 2017-08-17 DIAGNOSIS — R0602 Shortness of breath: Secondary | ICD-10-CM | POA: Insufficient documentation

## 2017-08-17 HISTORY — DX: Anemia, unspecified: D64.9

## 2017-08-17 LAB — URINALYSIS, COMPLETE (UACMP) WITH MICROSCOPIC
BILIRUBIN URINE: NEGATIVE
Bacteria, UA: NONE SEEN
Glucose, UA: NEGATIVE mg/dL
HGB URINE DIPSTICK: NEGATIVE
Ketones, ur: NEGATIVE mg/dL
LEUKOCYTES UA: NEGATIVE
Nitrite: NEGATIVE
Protein, ur: NEGATIVE mg/dL
SPECIFIC GRAVITY, URINE: 1.024 (ref 1.005–1.030)
pH: 5 (ref 5.0–8.0)

## 2017-08-17 LAB — BASIC METABOLIC PANEL
Anion gap: 8 (ref 5–15)
BUN: 13 mg/dL (ref 6–20)
CALCIUM: 9.3 mg/dL (ref 8.9–10.3)
CO2: 25 mmol/L (ref 22–32)
Chloride: 105 mmol/L (ref 101–111)
Creatinine, Ser: 1.07 mg/dL — ABNORMAL HIGH (ref 0.44–1.00)
GFR calc Af Amer: >60 mL/min (ref >60)
GLUCOSE: 87 mg/dL (ref 65–99)
Potassium: 3.7 mmol/L (ref 3.5–5.1)
Sodium: 138 mmol/L (ref 135–145)

## 2017-08-17 LAB — CBC WITH DIFFERENTIAL/PLATELET
BASOS ABS: 0.1 10*3/uL (ref 0–0.1)
BASOS PCT: 1 %
EOS PCT: 0 %
Eosinophils Absolute: 0 10*3/uL (ref 0–0.7)
HCT: 22.4 % — ABNORMAL LOW (ref 35.0–47.0)
Hemoglobin: 6.9 g/dL — ABNORMAL LOW (ref 12.0–16.0)
Lymphocytes Relative: 15 %
Lymphs Abs: 0.9 10*3/uL — ABNORMAL LOW (ref 1.0–3.6)
MCH: 19.9 pg — ABNORMAL LOW (ref 26.0–34.0)
MCHC: 30.9 g/dL — ABNORMAL LOW (ref 32.0–36.0)
MCV: 64.3 fL — ABNORMAL LOW (ref 80.0–100.0)
MONO ABS: 0.2 10*3/uL (ref 0.2–0.9)
Monocytes Relative: 4 %
Neutro Abs: 4.6 10*3/uL (ref 1.4–6.5)
Neutrophils Relative %: 80 %
Platelets: 271 10*3/uL (ref 150–440)
RBC: 3.48 MIL/uL — ABNORMAL LOW (ref 3.80–5.20)
RDW: 25.9 % — AB (ref 11.5–14.5)
WBC: 5.8 10*3/uL (ref 3.6–11.0)

## 2017-08-17 LAB — BRAIN NATRIURETIC PEPTIDE: B NATRIURETIC PEPTIDE 5: 6 pg/mL (ref 0.0–100.0)

## 2017-08-17 MED ORDER — FUROSEMIDE 20 MG PO TABS: 20.0000 mg | ORAL_TABLET | Freq: Every day | ORAL | 0 refills | Status: DC

## 2017-08-17 MED ORDER — POTASSIUM CHLORIDE ER 10 MEQ PO TBCR: 10.0000 meq | EXTENDED_RELEASE_TABLET | Freq: Every day | ORAL | 0 refills | Status: DC

## 2017-08-17 NOTE — ED Provider Notes (Signed)
[facility] Emergency Department Provider Note   ____________________________________________   None    (approximate)  I have reviewed the triage vital signs and the nursing notes.   HISTORY  Chief Complaint Leg Swelling    HPI Kathy Vega is a 41 y.o. female  patient complaining of bilateral lower edema and pain and swelling left greater than right. Patient seen 4 months ago for same complaint and was given a prescription for Lasix and advised follow-up cardiologist. Patient does not follow up as directed. Patient states the pills are not helping. I did a pill count showed the patient has 10 pills left out of a prescription for 30. The patient had taken the pills every day as prescribed for months ago. Patient states has mild shortness of breath. Patient denies chest pain.Patient rates her lower extremity pain as a 9/10. Patient described a pain as "achy".   Past Medical History:  Diagnosis Date  . Anemia     There are no active problems to display for this patient.   Past Surgical History:  Procedure Laterality Date  . LEG SURGERY      Prior to Admission medications   Medication Sig Start Date End Date Taking? Authorizing Provider  ferrous sulfate (SLOW FE) 160 (50 Fe) MG TBCR SR tablet Take 1 tablet (160 mg total) by mouth daily. 04/03/17   Schuyler Amor, MD  furosemide (LASIX) 20 MG tablet Take 1 tablet (20 mg total) by mouth daily. 08/17/17 08/17/18  Sable Feil, PA-C  potassium chloride (K-DUR) 10 MEQ tablet Take 1 tablet (10 mEq total) by mouth daily. 08/17/17   Sable Feil, PA-C    Allergies Patient has no known allergies.  No family history on file.  Social History Social History  Substance Use Topics  . Smoking status: Never Smoker  . Smokeless tobacco: Never Used  . Alcohol use No    Review of Systems  Constitutional: No fever/chills Eyes: No visual changes. ENT: No sore throat. Cardiovascular: Denies chest  pain. Respiratory:  Mild intermitting shortness of breath. Gastrointestinal: No abdominal pain.  No nausea, no vomiting.  No diarrhea.  No constipation. Genitourinary: Negative for dysuria. Musculoskeletal:  Bilateral lower extremity pain and swelling Skin: Negative for rash. Neurological: Negative for headaches, focal weakness or numbness.   ____________________________________________   PHYSICAL EXAM:  VITAL SIGNS:   Enc Vitals Group     BP 122/77     Pulse Rate 62     Resp 16     Temp 99.1 F (37.3 C)     Temp Source Oral     SpO2 100 %     Weight 220 lb (99.8 kg)     Height 5\' 9"  (1.753 m)     Head Circumference      Peak Flow      Pain Score 9     Pain Loc      Pain Edu?      Excl. in [place]?     Constitutional: Alert and oriented. Well appearing and in no acute distress. Cardiovascular: Normal rate, regular rhythm. Grossly normal heart sounds.  Good peripheral circulation. Respiratory: Normal respiratory effort.  No retractions. Lungs CTAB. Musculoskeletal: Bilateral lower extremity edema.  No pitting edema. No calf pain. Neurologic:  Normal speech and language. No gross focal neurologic deficits are appreciated. No gait instability. Skin:  Skin is warm, dry and intact. No rash noted. Psychiatric: Mood and affect are normal. Speech and behavior are normal.  ____________________________________________   LABS (all labs ordered are listed, but only abnormal results are displayed)  Labs Reviewed  BASIC METABOLIC PANEL - Abnormal; Notable for the following:       Result Value   Creatinine, Ser 1.07 (*)    All other components within normal limits  CBC WITH DIFFERENTIAL/PLATELET - Abnormal; Notable for the following:    RBC 3.48 (*)    Hemoglobin 6.9 (*)    HCT 22.4 (*)    MCV 64.3 (*)    MCH 19.9 (*)    MCHC 30.9 (*)    RDW 25.9 (*)    Lymphs Abs 0.9 (*)    All other components within normal limits  URINALYSIS, COMPLETE (UACMP) WITH MICROSCOPIC - Abnormal;  Notable for the following:    Color, Urine YELLOW (*)    APPearance CLEAR (*)    Squamous Epithelial / LPF 6-30 (*)    All other components within normal limits  BRAIN NATRIURETIC PEPTIDE   ____________________________________________  EKG   ____________________________________________  RADIOLOGY  US Venous Img Lower Unilateral Left  Result Date: 08/17/2017 CLINICAL DATA:  Left leg pain, edema and redness. EXAM: Left LOWER EXTREMITY VENOUS DOPPLER ULTRASOUND TECHNIQUE: Gray-scale sonography with graded compression, as well as color Doppler and duplex ultrasound were performed to evaluate the lower extremity deep venous systems from the level of the common femoral vein and including the common femoral, femoral, profunda femoral, popliteal and calf veins including the posterior tibial, peroneal and gastrocnemius veins when visible. The superficial great saphenous vein was also interrogated. Spectral Doppler was utilized to evaluate flow at rest and with distal augmentation maneuvers in the common femoral, femoral and popliteal veins. COMPARISON:  04/03/2017 FINDINGS: Contralateral Common Femoral Vein: Respiratory phasicity is normal and symmetric with the symptomatic side. No evidence of thrombus. Normal compressibility. Common Femoral Vein: No evidence of thrombus. Normal compressibility, respiratory phasicity and response to augmentation. Saphenofemoral Junction: No evidence of thrombus. Normal compressibility and flow on color Doppler imaging. Profunda Femoral Vein: No evidence of thrombus. Normal compressibility and flow on color Doppler imaging. Femoral Vein: No evidence of thrombus. Normal compressibility, respiratory phasicity and response to augmentation. Popliteal Vein: No evidence of thrombus. Normal compressibility, respiratory phasicity and response to augmentation. Calf Veins: No evidence of thrombus. Normal compressibility and flow on color Doppler imaging. Superficial Great Saphenous  Vein: No evidence of thrombus. Normal compressibility and flow on color Doppler imaging. Venous Reflux:  None. Other Findings:  None. IMPRESSION: No evidence of DVT within the left lower extremity. Electronically Signed   By: Marin Olp M.D.   On: 08/17/2017 13:46    ____________________________________________   PROCEDURES  Procedure(s) performed: None  Procedures  Critical Care performed: No  ____________________________________________   INITIAL IMPRESSION / ASSESSMENT AND PLAN / ED COURSE  Pertinent labs & imaging results that were available during my care of the patient were reviewed by me and considered in my medical decision making (see chart for details).  Bilateral lower extremity edema. Noncompliance with treatment plan from last visit. We'll repeat labs for comparison from last visit.  Discussed negative ultrasound the left lower extremity with patient. Discussed lab results with patient. Advised to restart Lasix and potassium and encouraged her to follow-up with her PCP with reevaluation and continue care.      ____________________________________________   FINAL CLINICAL IMPRESSION(S) / ED DIAGNOSES  Final diagnoses:  Bilateral lower extremity edema      NEW MEDICATIONS STARTED DURING THIS VISIT:  Current Discharge Medication List  Note:  This document was prepared using Dragon voice recognition software and may include unintentional dictation errors.    Sable Feil, PA-C 08/17/17 1400    Lavonia Drafts, MD 08/17/17 [phone]

## 2017-08-17 NOTE — ED Notes (Signed)
See triage note   Presents with swelling to both feet  sxs' started several months ago  Currently taking lasix for same  But states this is different  Left lower leg is more swollen with slight redness noted   Ambulates well to treatment room

## 2017-08-17 NOTE — ED Triage Notes (Signed)
Patient presents to the ED with bilateral feet swelling and pain to feet.  Patient also reports tingling in her hands for several months.  Patient states she started taking lasix approx. 4 months ago due to bilateral feet swelling.  Patient states, "I've been taking my fluid pills so I don't think that's what this is."  Patient states she feels like the pigment in her feet is changing.  Patient is in no obvious distress at this time.  Patient states she has not recently contacted her PCP.  Feet are slightly swollen, no pitting noted.  Patient ambulatory to triage without difficulty.

## 2018-03-15 ENCOUNTER — Emergency Department
Admission: EM | Admit: 2018-03-15 | Discharge: 2018-03-16 | Disposition: A | Discharge status: Home / Self Care | Payer: Medicaid Other | Attending: Emergency Medicine | Admitting: Emergency Medicine

## 2018-03-15 ENCOUNTER — Encounter: Payer: Self-pay | Admitting: Emergency Medicine

## 2018-03-15 ENCOUNTER — Other Ambulatory Visit: Payer: Self-pay

## 2018-03-15 DIAGNOSIS — Z79899 Other long term (current) drug therapy: Secondary | ICD-10-CM | POA: Insufficient documentation

## 2018-03-15 DIAGNOSIS — D649 Anemia, unspecified: Secondary | ICD-10-CM

## 2018-03-15 DIAGNOSIS — N920 Excessive and frequent menstruation with regular cycle: Secondary | ICD-10-CM | POA: Insufficient documentation

## 2018-03-15 HISTORY — DX: Disorder of thyroid, unspecified: E07.9

## 2018-03-15 LAB — BASIC METABOLIC PANEL
Anion gap: 7 (ref 5–15)
BUN: 13 mg/dL (ref 6–20)
CHLORIDE: 104 mmol/L (ref 101–111)
CO2: 26 mmol/L (ref 22–32)
Calcium: 9.9 mg/dL (ref 8.9–10.3)
Creatinine, Ser: 1.17 mg/dL — ABNORMAL HIGH (ref 0.44–1.00)
GFR calc Af Amer: >60 mL/min (ref >60)
GFR calc non Af Amer: 57 mL/min — ABNORMAL LOW (ref >60)
GLUCOSE: 96 mg/dL (ref 65–99)
POTASSIUM: 3.5 mmol/L (ref 3.5–5.1)
Sodium: 137 mmol/L (ref 135–145)

## 2018-03-15 LAB — CBC
HEMATOCRIT: 22.7 % — AB (ref 35.0–47.0)
Hemoglobin: 7.1 g/dL — ABNORMAL LOW (ref 12.0–16.0)
MCH: 20.3 pg — AB (ref 26.0–34.0)
MCHC: 31.2 g/dL — AB (ref 32.0–36.0)
MCV: 65.1 fL — AB (ref 80.0–100.0)
Platelets: 354 10*3/uL (ref 150–440)
RBC: 3.48 MIL/uL — ABNORMAL LOW (ref 3.80–5.20)
RDW: 19.6 % — AB (ref 11.5–14.5)
WBC: 5 10*3/uL (ref 3.6–11.0)

## 2018-03-15 LAB — IRON AND TIBC
IRON: 28 ug/dL (ref 28–170)
Saturation Ratios: 5 % — ABNORMAL LOW (ref 10.4–31.8)
TIBC: 523 ug/dL — ABNORMAL HIGH (ref 250–450)
UIBC: 495 ug/dL

## 2018-03-15 LAB — ABO/RH: ABO/RH(D): O POS

## 2018-03-15 LAB — VITAMIN B12: Vitamin B-12: 223 pg/mL (ref 180–914)

## 2018-03-15 LAB — PREPARE RBC (CROSSMATCH)

## 2018-03-15 LAB — POCT PREGNANCY, URINE: Preg Test, Ur: NEGATIVE

## 2018-03-15 MED ORDER — FERROUS GLUCONATE 324 (38 FE) MG PO TABS: 324.0000 mg | ORAL_TABLET | Freq: Two times a day (BID) | ORAL | 3 refills | Status: DC

## 2018-03-15 MED ORDER — SODIUM CHLORIDE 0.9 % IV SOLN
10.0000 mL/h | Freq: Once | INTRAVENOUS | Status: DC
Start: 2018-03-15 — End: 2018-03-16

## 2018-03-15 MED ORDER — FERROUS GLUCONATE 324 (38 FE) MG PO TABS: 324.0000 mg | ORAL_TABLET | Freq: Two times a day (BID) | ORAL | Status: DC

## 2018-03-15 NOTE — ED Notes (Signed)
Blood product started 1928

## 2018-03-15 NOTE — ED Triage Notes (Signed)
Went to pcp yesterday and they did blood work.  Says was called back due to anemia, hgb 6

## 2018-03-15 NOTE — Discharge Instructions (Addendum)
Please follow-up with OB/GYN.  Call Monday morning to set up an appointment.  They can help decrease the amount of blood loss you are having from your menstrual periods.  Please return if you have any rectal bleeding or get very weak or tired again.  If you ran out of the iron pills you had then take the iron pills and giving you twice a day.  Make sure you are taking something to keep you from getting constipated.

## 2018-03-15 NOTE — ED Provider Notes (Signed)
[facility] Emergency Department Provider Note   ____________________________________________   First MD Initiated Contact with Patient 03/15/18 1528     (approximate)  I have reviewed the triage vital signs and the nursing notes.   HISTORY  Chief Complaint Abnormal Lab    HPI Kathy Vega is a 42 y.o. female patient sent here for symptomatic anemia hemoglobin was apparently 6.7 at the office yesterday.  Patient is taking ferrous sulfate 3 times a day.  She had this similar problem about a year ago and was treated and improved but then stopped the iron and now is worse again.  She says she had a rectal exam recently that was Hemoccult negative and she still having heavy periods which seem to be the etiology of her blood loss.  I discussed with her taking iron replacement and following up versus giving her a unit of blood.  Discussed the risks and benefits of infection reactions etc. she wishes to get the unit of blood.  We will do that and plan on discharging her home.  She also complains of tingling in the fingers I will get B12 and folate just to make sure.  Patient is not lightheaded or passing out he is not having chest pain she just feels very tired all the time.  She felt when she had anemia last time.  Past Medical History:  Diagnosis Date  . Anemia   . Thyroid disease     There are no active problems to display for this patient.   Past Surgical History:  Procedure Laterality Date  . CHOLECYSTECTOMY    . LEG SURGERY      Prior to Admission medications   Medication Sig Start Date End Date Taking? Authorizing Provider  ferrous sulfate 325 (65 FE) MG EC tablet Take 325 mg by mouth daily.   Yes [provider]  levothyroxine (SYNTHROID, LEVOTHROID) 125 MCG tablet Take 125 mcg by mouth daily before breakfast.   Yes [provider]  ferrous gluconate (FERGON) 324 MG tablet Take 1 tablet (324 mg total) by mouth 2 (two) times daily  with a meal. 03/15/18   [name], MD  ferrous sulfate (SLOW FE) 160 (50 Fe) MG TBCR SR tablet Take 1 tablet (160 mg total) by mouth daily. Patient not taking: Reported on 03/15/2018 04/03/17   Schuyler Amor, MD  furosemide (LASIX) 20 MG tablet Take 1 tablet (20 mg total) by mouth daily. Patient not taking: Reported on 03/15/2018 08/17/17 08/17/18  Sable Feil, PA-C  potassium chloride (K-DUR) 10 MEQ tablet Take 1 tablet (10 mEq total) by mouth daily. Patient not taking: Reported on 03/15/2018 08/17/17   Sable Feil, PA-C    Allergies Patient has no known allergies.  No family history on file.  Social History Social History   Tobacco Use  . Smoking status: Never Smoker  . Smokeless tobacco: Never Used  Substance Use Topics  . Alcohol use: No  . Drug use: Not on file    Review of Systems  Constitutional: No fever/chills Eyes: No visual changes. ENT: No sore throat. Cardiovascular: Denies chest pain. Respiratory: Denies shortness of breath. Gastrointestinal: No abdominal pain.  No nausea, no vomiting.  No diarrhea.  No constipation. Genitourinary: Negative for dysuria. Musculoskeletal: Negative for back pain. Skin: Negative for rash. Neurological: Negative for headaches, focal weakness   ____________________________________________   PHYSICAL EXAM:  VITAL SIGNS: ED Triage Vitals  Enc Vitals Group     BP 03/15/18  1353 123/75     Pulse Rate 03/15/18 1353 93     Resp 03/15/18 1353 16     Temp 03/15/18 1353 99 F (37.2 C)     Temp Source 03/15/18 1353 Oral     SpO2 03/15/18 1353 100 %     Weight 03/15/18 1354 237 lb (107.5 kg)     Height 03/15/18 1354 5\' 9"  (1.753 m)     Head Circumference --      Peak Flow --      Pain Score --      Pain Loc --      Pain Edu? --      Excl. in [place]? --     Constitutional: Alert and oriented. Well appearing and in no acute distress. Eyes: Conjunctivae are normal.  Head: Atraumatic. Nose: No  congestion/rhinnorhea. Mouth/Throat: Mucous membranes are moist.  Oropharynx non-erythematous. Neck: No stridor.   Cardiovascular: Normal rate, regular rhythm. Grossly normal heart sounds.  Good peripheral circulation. Respiratory: Normal respiratory effort.  No retractions. Lungs CTAB. Gastrointestinal: Soft and nontender. No distention. No abdominal bruits. No CVA tenderness. Musculoskeletal: No lower extremity tenderness nor edema.  No joint effusions. Neurologic:  Normal speech and language. No gross focal neurologic deficits are appreciated. No gait instability. Skin:  Skin is warm, dry and intact. No rash noted. Psychiatric: Mood and affect are normal. Speech and behavior are normal.  ____________________________________________   LABS (all labs ordered are listed, but only abnormal results are displayed)  Labs Reviewed  BASIC METABOLIC PANEL - Abnormal; Notable for the following components:      Result Value   Creatinine, Ser 1.17 (*)    GFR calc non Af Amer 57 (*)    All other components within normal limits  CBC - Abnormal; Notable for the following components:   RBC 3.48 (*)    Hemoglobin 7.1 (*)    HCT 22.7 (*)    MCV 65.1 (*)    MCH 20.3 (*)    MCHC 31.2 (*)    RDW 19.6 (*)    All other components within normal limits  IRON AND TIBC - Abnormal; Notable for the following components:   TIBC 523 (*)    Saturation Ratios 5 (*)    All other components within normal limits  FOLATE RBC  VITAMIN B12  POCT PREGNANCY, URINE  POC URINE PREG, ED  PREPARE RBC (CROSSMATCH)  TYPE AND SCREEN  ABO/RH   ____________________________________________  EKG EKG read interpreted by me shows normal sinus rhythm rate of 83 normal axis no acute ST-T wave changes there is low voltage throughout. ____________________________________________  RADIOLOGY  ED MD interpretation:    Official radiology report(s): No results  found.  ____________________________________________   PROCEDURES  Procedure(s) performed:   Procedures  Critical Care performed:   ____________________________________________   INITIAL IMPRESSION / ASSESSMENT AND PLAN / ED COURSE Discussed with Dr. Bobbye Charleston and Dr. Nechama Guard OB/GYN Dr. Bobbye Charleston is asking if we can transfuse her here in the ER since it is only 1 unit we will do that Dr. Nechama Guard will follow-up in the office for her heavy bleeding recommends no further treatment at this point.          ____________________________________________   FINAL CLINICAL IMPRESSION(S) / ED DIAGNOSES  Final diagnoses:  Symptomatic anemia  Menorrhagia with regular cycle     ED Discharge Orders        Ordered    ferrous gluconate (FERGON) 324 MG tablet  2 times daily with  meals     03/15/18 2048       Note:  This document was prepared using Dragon voice recognition software and may include unintentional dictation errors.    [name], MD 03/15/18 2126

## 2018-03-15 NOTE — ED Notes (Signed)
Rate increased to 125 ml/hr for blood administration

## 2018-03-15 NOTE — ED Notes (Signed)
Pt reports no pain but states she has numbness and tingling in both arms down into the hands for over a month. Pt states she has just been feeling tired lately.

## 2018-03-16 LAB — TYPE AND SCREEN
ABO/RH(D): O POS
Antibody Screen: NEGATIVE
Unit division: 0

## 2018-03-16 LAB — FOLATE RBC
Folate, Hemolysate: 274.1 ng/mL
Folate, RBC: 1246 ng/mL (ref >498)
Hematocrit: 22 % — ABNORMAL LOW (ref 34.0–46.6)

## 2018-03-16 LAB — BPAM RBC
Blood Product Expiration Date: 201905152359
ISSUE DATE / TIME: 201904181905
UNIT TYPE AND RH: 5100

## 2018-11-12 ENCOUNTER — Emergency Department
Admission: EM | Admit: 2018-11-12 | Discharge: 2018-11-12 | Disposition: A | Discharge status: Home / Self Care | Payer: Medicaid Other | Attending: Emergency Medicine | Admitting: Emergency Medicine

## 2018-11-12 ENCOUNTER — Other Ambulatory Visit: Payer: Self-pay

## 2018-11-12 ENCOUNTER — Encounter: Payer: Self-pay | Admitting: Emergency Medicine

## 2018-11-12 DIAGNOSIS — L0291 Cutaneous abscess, unspecified: Secondary | ICD-10-CM

## 2018-11-12 DIAGNOSIS — E079 Disorder of thyroid, unspecified: Secondary | ICD-10-CM | POA: Insufficient documentation

## 2018-11-12 DIAGNOSIS — N611 Abscess of the breast and nipple: Secondary | ICD-10-CM | POA: Insufficient documentation

## 2018-11-12 DIAGNOSIS — Z79899 Other long term (current) drug therapy: Secondary | ICD-10-CM | POA: Insufficient documentation

## 2018-11-12 MED ORDER — HYDROCODONE-ACETAMINOPHEN 5-325 MG PO TABS: 1.0000 | ORAL_TABLET | Freq: Four times a day (QID) | ORAL | 0 refills | Status: DC | PRN

## 2018-11-12 MED ORDER — SULFAMETHOXAZOLE-TRIMETHOPRIM 800-160 MG PO TABS: 1.0000 | ORAL_TABLET | Freq: Two times a day (BID) | ORAL | 0 refills | Status: DC

## 2018-11-12 NOTE — ED Triage Notes (Signed)
Pt presents to ED via POV with her son who is also a patient. Pt presents with abscesses that are draining to L breast, states yellow drainage at this time. Pt states has been ongoing x 3 days.

## 2018-11-12 NOTE — ED Provider Notes (Signed)
[facility] Emergency Department Provider Note  ____________________________________________   First MD Initiated Contact with Patient 11/12/18 1235     (approximate)  I have reviewed the triage vital signs and the nursing notes.   HISTORY  Chief Complaint Abscess    HPI Kathy Vega is a 42 y.o. female presents emergency department complaining of a rash and drainage on the left breast.  States symptoms started 3 days ago.  She denies fever chills.  States area is tender.    Past Medical History:  Diagnosis Date  . Anemia   . Thyroid disease     There are no active problems to display for this patient.   Past Surgical History:  Procedure Laterality Date  . CHOLECYSTECTOMY    . LEG SURGERY      Prior to Admission medications   Medication Sig Start Date End Date Taking? Authorizing Provider  ferrous gluconate (FERGON) 324 MG tablet Take 1 tablet (324 mg total) by mouth 2 (two) times daily with a meal. 03/15/18   [name], MD  ferrous sulfate 325 (65 FE) MG EC tablet Take 325 mg by mouth daily.    [provider]  HYDROcodone-acetaminophen (NORCO/VICODIN) 5-325 MG tablet Take 1 tablet by mouth every 6 (six) hours as needed for moderate pain. 11/12/18   Maximilien Hayashi, Linden Dolin, PA-C  levothyroxine (SYNTHROID, LEVOTHROID) 125 MCG tablet Take 125 mcg by mouth daily before breakfast.    [provider]  sulfamethoxazole-trimethoprim (BACTRIM DS,SEPTRA DS) 800-160 MG tablet Take 1 tablet by mouth 2 (two) times daily. 11/12/18   Versie Starks, PA-C    Allergies Patient has no known allergies.  No family history on file.  Social History Social History   Tobacco Use  . Smoking status: Never Smoker  . Smokeless tobacco: Never Used  Substance Use Topics  . Alcohol use: No  . Drug use: Not on file    Review of Systems  Constitutional: No fever/chills Eyes: No visual changes. ENT: No sore throat. Respiratory: Denies  cough Genitourinary: Negative for dysuria. Musculoskeletal: Negative for back pain. Skin: Negative for rash.  Positive for abscess and drainage to the left breast    ____________________________________________   PHYSICAL EXAM:  VITAL SIGNS: ED Triage Vitals  Enc Vitals Group     BP 11/12/18 1210 120/85     Pulse Rate 11/12/18 1210 67     Resp 11/12/18 1210 18     Temp 11/12/18 1209 98.2 F (36.8 C)     Temp Source 11/12/18 1209 Oral     SpO2 11/12/18 1210 99 %     Weight 11/12/18 1210 245 lb (111.1 kg)     Height 11/12/18 1210 5\' 9"  (1.753 m)     Head Circumference --      Peak Flow --      Pain Score 11/12/18 1210 8     Pain Loc --      Pain Edu? --      Excl. in [place]? --     Constitutional: Alert and oriented. Well appearing and in no acute distress. Eyes: Conjunctivae are normal.  Head: Atraumatic. Nose: No congestion/rhinnorhea. Mouth/Throat: Mucous membranes are moist.   Neck:  supple no lymphadenopathy noted Cardiovascular: Normal rate, regular rhythm.  Respiratory: Normal respiratory effort.  No retractions, GU: deferred Musculoskeletal: FROM all extremities, warm and well perfused Neurologic:  Normal speech and language.  Skin:  Skin is warm, dry and intact. No rash noted.  Left breast  has any abscess noted.  Slight drainage is noted.  Area is tender to palpation. Psychiatric: Mood and affect are normal. Speech and behavior are normal.  ____________________________________________   LABS (all labs ordered are listed, but only abnormal results are displayed)  Labs Reviewed - No data to display ____________________________________________   ____________________________________________  RADIOLOGY    ____________________________________________   PROCEDURES  Procedure(s) performed: No  Procedures    ____________________________________________   INITIAL IMPRESSION / ASSESSMENT AND PLAN / ED COURSE  Pertinent labs & imaging results that  were available during my care of the patient were reviewed by me and considered in my medical decision making (see chart for details).   Patient is a 42 year old female presents emergency department with concerns of a left breast abscess.  The area on the breast is red swollen and drainage  Explained findings to the patient.  She is given a prescription for Bactrim and hydrocodone.  She is to return emergency department worsening.  See her regular doctor if not better in 3 days.  She states she understands will comply.  She is discharged in stable condition.     As part of my medical decision making, I reviewed the following data within the [place] notes reviewed and incorporated, Old chart reviewed, Notes from prior ED visits and [place] Controlled Substance Database  ____________________________________________   FINAL CLINICAL IMPRESSION(S) / ED DIAGNOSES  Final diagnoses:  Abscess      NEW MEDICATIONS STARTED DURING THIS VISIT:  Discharge Medication List as of 11/12/2018  1:29 PM    START taking these medications   Details  HYDROcodone-acetaminophen (NORCO/VICODIN) 5-325 MG tablet Take 1 tablet by mouth every 6 (six) hours as needed for moderate pain., Starting Mon 11/12/2018, Normal    sulfamethoxazole-trimethoprim (BACTRIM DS,SEPTRA DS) 800-160 MG tablet Take 1 tablet by mouth 2 (two) times daily., Starting Mon 11/12/2018, Normal         Note:  This document was prepared using Dragon voice recognition software and may include unintentional dictation errors.    Versie Starks, PA-C 11/12/18 1551    Carrie Mew, MD 11/13/18 [phone]

## 2018-11-12 NOTE — ED Notes (Signed)
See triage note  Presents with some drainage to left breast  States she noticed the areas about 3 days ago

## 2020-03-26 ENCOUNTER — Emergency Department: Payer: Medicaid Other

## 2020-03-26 ENCOUNTER — Encounter: Payer: Self-pay | Admitting: Emergency Medicine

## 2020-03-26 ENCOUNTER — Other Ambulatory Visit: Payer: Self-pay

## 2020-03-26 ENCOUNTER — Emergency Department
Admission: EM | Admit: 2020-03-26 | Discharge: 2020-03-26 | Disposition: A | Discharge status: Home / Self Care | Payer: Medicaid Other | Attending: Student | Admitting: Student

## 2020-03-26 DIAGNOSIS — M545 Low back pain, unspecified: Secondary | ICD-10-CM

## 2020-03-26 DIAGNOSIS — D649 Anemia, unspecified: Secondary | ICD-10-CM | POA: Insufficient documentation

## 2020-03-26 DIAGNOSIS — Z79899 Other long term (current) drug therapy: Secondary | ICD-10-CM | POA: Diagnosis not present

## 2020-03-26 LAB — URINALYSIS, COMPLETE (UACMP) WITH MICROSCOPIC
Bacteria, UA: NONE SEEN
Bilirubin Urine: NEGATIVE
Glucose, UA: NEGATIVE mg/dL
Hgb urine dipstick: NEGATIVE
Ketones, ur: NEGATIVE mg/dL
Leukocytes,Ua: NEGATIVE
Nitrite: NEGATIVE
Protein, ur: NEGATIVE mg/dL
Specific Gravity, Urine: 1.021 (ref 1.005–1.030)
pH: 5 (ref 5.0–8.0)

## 2020-03-26 LAB — COMPREHENSIVE METABOLIC PANEL
ALT: 25 U/L (ref 0–44)
AST: 38 U/L (ref 15–41)
Albumin: 4.2 g/dL (ref 3.5–5.0)
Alkaline Phosphatase: 55 U/L (ref 38–126)
Anion gap: 9 (ref 5–15)
BUN: 10 mg/dL (ref 6–20)
CO2: 26 mmol/L (ref 22–32)
Calcium: 9.6 mg/dL (ref 8.9–10.3)
Chloride: 104 mmol/L (ref 98–111)
Creatinine, Ser: 0.92 mg/dL (ref 0.44–1.00)
GFR calc Af Amer: >60 mL/min (ref >60)
GFR calc non Af Amer: >60 mL/min (ref >60)
Glucose, Bld: 98 mg/dL (ref 70–99)
Potassium: 4 mmol/L (ref 3.5–5.1)
Sodium: 139 mmol/L (ref 135–145)
Total Bilirubin: 0.8 mg/dL (ref 0.3–1.2)
Total Protein: 7.5 g/dL (ref 6.5–8.1)

## 2020-03-26 LAB — CBC WITH DIFFERENTIAL/PLATELET
Abs Immature Granulocytes: 0.02 10*3/uL (ref 0.00–0.07)
Basophils Absolute: 0 10*3/uL (ref 0.0–0.1)
Basophils Relative: 1 %
Eosinophils Absolute: 0.1 10*3/uL (ref 0.0–0.5)
Eosinophils Relative: 1 %
HCT: 26.3 % — ABNORMAL LOW (ref 36.0–46.0)
Hemoglobin: 7.9 g/dL — ABNORMAL LOW (ref 12.0–15.0)
Immature Granulocytes: 0 %
Lymphocytes Relative: 23 %
Lymphs Abs: 1.4 10*3/uL (ref 0.7–4.0)
MCH: 21.8 pg — ABNORMAL LOW (ref 26.0–34.0)
MCHC: 30 g/dL (ref 30.0–36.0)
MCV: 72.5 fL — ABNORMAL LOW (ref 80.0–100.0)
Monocytes Absolute: 0.3 10*3/uL (ref 0.1–1.0)
Monocytes Relative: 5 %
Neutro Abs: 4.3 10*3/uL (ref 1.7–7.7)
Neutrophils Relative %: 70 %
Platelets: 420 10*3/uL — ABNORMAL HIGH (ref 150–400)
RBC: 3.63 MIL/uL — ABNORMAL LOW (ref 3.87–5.11)
RDW: 20.4 % — ABNORMAL HIGH (ref 11.5–15.5)
WBC: 6.2 10*3/uL (ref 4.0–10.5)
nRBC: 0.3 % — ABNORMAL HIGH (ref 0.0–0.2)

## 2020-03-26 LAB — RETICULOCYTES
Immature Retic Fract: 29 % — ABNORMAL HIGH (ref 2.3–15.9)
RBC.: 3.59 MIL/uL — ABNORMAL LOW (ref 3.87–5.11)
Retic Count, Absolute: 56.7 10*3/uL (ref 19.0–186.0)
Retic Ct Pct: 1.6 % (ref 0.4–3.1)

## 2020-03-26 LAB — CBC
HCT: 30.1 % — ABNORMAL LOW (ref 36.0–46.0)
Hemoglobin: 9.1 g/dL — ABNORMAL LOW (ref 12.0–15.0)
MCH: 22.9 pg — ABNORMAL LOW (ref 26.0–34.0)
MCHC: 30.2 g/dL (ref 30.0–36.0)
MCV: 75.8 fL — ABNORMAL LOW (ref 80.0–100.0)
Platelets: 422 10*3/uL — ABNORMAL HIGH (ref 150–400)
RBC: 3.97 MIL/uL (ref 3.87–5.11)
RDW: 22 % — ABNORMAL HIGH (ref 11.5–15.5)
WBC: 7 10*3/uL (ref 4.0–10.5)
nRBC: 0.3 % — ABNORMAL HIGH (ref 0.0–0.2)

## 2020-03-26 LAB — IRON AND TIBC
Iron: 44 ug/dL (ref 28–170)
Saturation Ratios: 9 % — ABNORMAL LOW (ref 10.4–31.8)
TIBC: 519 ug/dL — ABNORMAL HIGH (ref 250–450)
UIBC: 475 ug/dL

## 2020-03-26 LAB — POCT PREGNANCY, URINE: Preg Test, Ur: NEGATIVE

## 2020-03-26 LAB — PREPARE RBC (CROSSMATCH)

## 2020-03-26 LAB — VITAMIN B12: Vitamin B-12: 199 pg/mL (ref 180–914)

## 2020-03-26 LAB — FOLATE: Folate: 7.4 ng/mL (ref >5.9)

## 2020-03-26 MED ORDER — CYCLOBENZAPRINE HCL 10 MG PO TABS
10.0000 mg | ORAL_TABLET | Freq: Three times a day (TID) | ORAL | 0 refills | Status: DC | PRN
Start: 2020-03-26 — End: 2020-08-13

## 2020-03-26 MED ORDER — LIDOCAINE 5 % EX PTCH
1.00 | MEDICATED_PATCH | CUTANEOUS | Status: DC
Start: 2020-03-26 — End: 2020-03-26

## 2020-03-26 MED ORDER — SODIUM CHLORIDE 0.9% IV SOLUTION
Freq: Once | INTRAVENOUS | Status: DC
  Filled 2020-03-26: qty 250

## 2020-03-26 MED ORDER — ACETAMINOPHEN 500 MG PO TABS
1000.0000 mg | ORAL_TABLET | Freq: Once | ORAL | Status: AC
  Administered 2020-03-26: 19:00:00 1000 mg via ORAL
  Filled 2020-03-26: qty 2

## 2020-03-26 MED ORDER — ONDANSETRON HCL 4 MG/2ML IJ SOLN
4.0000 mg | Freq: Once | INTRAMUSCULAR | Status: AC
  Administered 2020-03-26: 4 mg via INTRAVENOUS
  Filled 2020-03-26: qty 2

## 2020-03-26 MED ORDER — TRAMADOL HCL 50 MG PO TABS: 50.0000 mg | ORAL_TABLET | Freq: Four times a day (QID) | ORAL | 0 refills | Status: DC | PRN

## 2020-03-26 MED ORDER — MORPHINE SULFATE (PF) 4 MG/ML IV SOLN
4.0000 mg | Freq: Once | INTRAVENOUS | Status: AC
  Administered 2020-03-26: 4 mg via INTRAVENOUS
  Filled 2020-03-26: qty 1

## 2020-03-26 NOTE — ED Notes (Signed)
Pt moved to room 11 for blood transfusion.  Report off to [employer].  Dr Joan Mayans aware of pt.

## 2020-03-26 NOTE — ED Triage Notes (Signed)
Presents with lower back pain which started which 4 days ago w/o injury ambulates well to treatment room  Denies any urinary sx's   Also having some pain to right knee  States she went to [facility] yesterday but was not seen

## 2020-03-26 NOTE — ED Notes (Signed)
Pt given pain meds per EDP order. Pt has No other needs at this time.

## 2020-03-26 NOTE — ED Notes (Signed)
Report received Curator. Patient care assumed. Patient/RN introduction complete. Will continue to monitor. Transfusion complete, pt tolerated well.

## 2020-03-26 NOTE — Discharge Instructions (Addendum)
Thank you for letting us take care of you in the emergency department today.   Please continue to take any regular, prescribed medications.   New medications we have prescribed:  Flexeril - muscle relaxer to help w/ your pain Tramadol - pain medication for symptoms you cannot first control with ibuprofen/Tylenol or the Flexeril. Do not take this with alcohol.   Please follow up with: Your primary care doctor to review your ER visit and follow up on your symptoms.  Hematology doctor (blood doctor) - information below   Please return to the ER for any new or worsening symptoms.

## 2020-03-26 NOTE — ED Notes (Addendum)
Pt c/o back pain (chronic) 8/10. NAD noted, pt comfortable other than back pain. EDP aware.

## 2020-03-26 NOTE — ED Notes (Signed)
Patient discharged to home per MD order. Patient in stable condition, and deemed medically cleared by ED provider for discharge. Discharge instructions reviewed with patient/family using "Teach Back"; verbalized understanding of medication education and administration, and information about follow-up care. Denies further concerns. ° °

## 2020-03-26 NOTE — ED Provider Notes (Signed)
[facility] Emergency Department Provider Note  ____________________________________________   First MD Initiated Contact with Patient 03/26/20 1318     (approximate)  I have reviewed the triage vital signs and the nursing notes.   HISTORY  Chief Complaint Back Pain    HPI Kathy Vega is a 44 y.o. female presents emergency department complaining of low back pain which started 4 days ago.  No known injury.  Also states she is having some anemia type symptoms for her fingers have been tingling her arms and felt numb.  History of anemia where she had a blood transfusion.  No fever or chills.    Past Medical History:  Diagnosis Date  . Anemia   . Thyroid disease     There are no problems to display for this patient.   Past Surgical History:  Procedure Laterality Date  . CHOLECYSTECTOMY    . LEG SURGERY      Prior to Admission medications   Medication Sig Start Date End Date Taking? Authorizing Provider  cyclobenzaprine (FLEXERIL) 10 MG tablet Take 1 tablet (10 mg total) by mouth 3 (three) times daily as needed. 03/26/20   Aries Kasa, Linden Dolin, PA-C  ferrous gluconate (FERGON) 324 MG tablet Take 1 tablet (324 mg total) by mouth 2 (two) times daily with a meal. 03/15/18   [name], MD  ferrous sulfate 325 (65 FE) MG EC tablet Take 325 mg by mouth daily.    [provider]  levothyroxine (SYNTHROID, LEVOTHROID) 125 MCG tablet Take 125 mcg by mouth daily before breakfast.    [provider]  traMADol (ULTRAM) 50 MG tablet Take 1 tablet (50 mg total) by mouth every 6 (six) hours as needed. 03/26/20   Versie Starks, PA-C    Allergies Patient has no known allergies.  No family history on file.  Social History Social History   Tobacco Use  . Smoking status: Never Smoker  . Smokeless tobacco: Never Used  Substance Use Topics  . Alcohol use: No  . Drug use: Not on file    Review of Systems  Constitutional: No  fever/chills Eyes: No visual changes. ENT: No sore throat. Respiratory: Denies cough Cardiovascular: Denies chest pain Gastrointestinal: Denies abdominal pain Genitourinary: Negative for dysuria. Musculoskeletal: Positive for back pain. Skin: Negative for rash. Psychiatric: no mood changes,     ____________________________________________   PHYSICAL EXAM:  VITAL SIGNS: ED Triage Vitals  Enc Vitals Group     BP 03/26/20 1301 130/80     Pulse Rate 03/26/20 1301 76     Resp 03/26/20 1301 18     Temp 03/26/20 1301 98 F (36.7 C)     Temp Source 03/26/20 1301 Oral     SpO2 03/26/20 1301 98 %     Weight 03/26/20 1256 250 lb (113.4 kg)     Height 03/26/20 1256 5\' 9"  (1.753 m)     Head Circumference --      Peak Flow --      Pain Score 03/26/20 1256 10     Pain Loc --      Pain Edu? --      Excl. in [place]? --     Constitutional: Alert and oriented. Well appearing and in no acute distress. Eyes: Conjunctivae are normal.  Head: Atraumatic. Nose: No congestion/rhinnorhea. Mouth/Throat: Mucous membranes are moist.   Neck:  supple no lymphadenopathy noted Cardiovascular: Normal rate, regular rhythm. Heart sounds are normal Respiratory: Normal respiratory effort.  No  retractions, lungs c t a  Abd: soft nontender bs normal all 4 quad GU: deferred Musculoskeletal: FROM all extremities, warm and well perfused, lumbar spines mildly tender to palpation Neurologic:  Normal speech and language.  Skin:  Skin is warm, dry and intact. No rash noted. Psychiatric: Mood and affect are normal. Speech and behavior are normal.  ____________________________________________   LABS (all labs ordered are listed, but only abnormal results are displayed)  Labs Reviewed  CBC WITH DIFFERENTIAL/PLATELET - Abnormal; Notable for the following components:      Result Value   RBC 3.63 (*)    Hemoglobin 7.9 (*)    HCT 26.3 (*)    MCV 72.5 (*)    MCH 21.8 (*)    RDW 20.4 (*)    Platelets 420 (*)     nRBC 0.3 (*)    All other components within normal limits  RETICULOCYTES - Abnormal; Notable for the following components:   RBC. 3.59 (*)    Immature Retic Fract 29.0 (*)    All other components within normal limits  IRON AND TIBC - Abnormal; Notable for the following components:   TIBC 519 (*)    Saturation Ratios 9 (*)    All other components within normal limits  COMPREHENSIVE METABOLIC PANEL  FOLATE  URINALYSIS, COMPLETE (UACMP) WITH MICROSCOPIC  VITAMIN B12  POC URINE PREG, ED  TYPE AND SCREEN  PREPARE RBC (CROSSMATCH)   ____________________________________________   ____________________________________________  RADIOLOGY  X-ray lumbar spine does not show any acute findings  ____________________________________________   PROCEDURES  Procedure(s) performed: No  Procedures    ____________________________________________   INITIAL IMPRESSION / ASSESSMENT AND PLAN / ED COURSE  Pertinent labs & imaging results that were available during my care of the patient were reviewed by me and considered in my medical decision making (see chart for details).   Patient's 44 year old female presents emergency department with concerns of anemia and low back pain.  See HPI patient was seen at [facility] yesterday but left due to the weight.  On review of these labs for hemoglobin was a 7.8   Physical exam patient appears well.  Lumbar spine swelling tender to palpation.  Remainder of the exam is unremarkable  DDx: Iron deficiency anemia, muscle strain, lumbar ago  CBC has hemoglobin 7.9, comprehensive metabolic panel is normal, iron TIBC is 519, saturation is 9, iron is 44, U IBC is 475, reticulocyte count is RBCs are decreased at 3.59, type and screen was performed, folate is normal  I feel that with the hemoglobin being at a 7.9 the patient is symptomatic it would be beneficial for her to have a transfusion of 1 unit of blood.  I did explain everything to the patient.  Also  explained to her that her x-ray is normal and does not show any acute abnormality.  She was given morphine 4 mg IV and Zofran while here in the ED.  We also decided to give her 1 unit of blood due to the low hemoglobin.  I did discuss this with Dr. Joan Mayans.  Patient will be transferred to the major side for the transfusion.  After that she will be discharged in stable condition.  She is to follow-up with hematology at the [place] for evaluation of the chronic anemia.    Kathy Vega was evaluated in Emergency Department on 03/26/2020 for the symptoms described in the history of present illness. She was evaluated in the context of the global COVID-19 pandemic,  which necessitated consideration that the patient might be at risk for infection with the SARS-CoV-2 virus that causes COVID-19. Institutional protocols and algorithms that pertain to the evaluation of patients at risk for COVID-19 are in a state of rapid change based on information released by regulatory bodies including the CDC and federal and state organizations. These policies and algorithms were followed during the patient's care in the ED.   As part of my medical decision making, I reviewed the following data within the [place] notes reviewed and incorporated, Labs reviewed , Radiograph reviewed , Notes from prior ED visits and [place] Controlled Substance Database  ____________________________________________   FINAL CLINICAL IMPRESSION(S) / ED DIAGNOSES  Final diagnoses:  Symptomatic anemia  Acute midline low back pain without sciatica      NEW MEDICATIONS STARTED DURING THIS VISIT:  New Prescriptions   CYCLOBENZAPRINE (FLEXERIL) 10 MG TABLET    Take 1 tablet (10 mg total) by mouth 3 (three) times daily as needed.   TRAMADOL (ULTRAM) 50 MG TABLET    Take 1 tablet (50 mg total) by mouth every 6 (six) hours as needed.     Note:  This document was prepared using Dragon voice  recognition software and may include unintentional dictation errors.    Versie Starks, PA-C 03/26/20 1728    Lilia Pro., MD 03/27/20 1201

## 2020-03-27 LAB — TYPE AND SCREEN
ABO/RH(D): O POS
Antibody Screen: NEGATIVE
Unit division: 0

## 2020-03-27 LAB — BPAM RBC
Blood Product Expiration Date: 202106022359
ISSUE DATE / TIME: 202104291731
Unit Type and Rh: 5100

## 2020-08-13 ENCOUNTER — Other Ambulatory Visit: Payer: Self-pay

## 2020-08-13 ENCOUNTER — Inpatient Hospital Stay
Admission: EM | Admit: 2020-08-13 | Discharge: 2020-10-02 | DRG: 004 | Disposition: A | Discharge status: Home Health | Payer: Medicaid Other | Attending: Internal Medicine | Admitting: Internal Medicine

## 2020-08-13 ENCOUNTER — Emergency Department: Payer: Medicaid Other

## 2020-08-13 DIAGNOSIS — N170 Acute kidney failure with tubular necrosis: Secondary | ICD-10-CM | POA: Diagnosis not present

## 2020-08-13 DIAGNOSIS — R748 Abnormal levels of other serum enzymes: Secondary | ICD-10-CM | POA: Diagnosis not present

## 2020-08-13 DIAGNOSIS — G928 Other toxic encephalopathy: Secondary | ICD-10-CM | POA: Diagnosis present

## 2020-08-13 DIAGNOSIS — J1282 Pneumonia due to coronavirus disease 2019: Secondary | ICD-10-CM

## 2020-08-13 DIAGNOSIS — Z79890 Hormone replacement therapy (postmenopausal): Secondary | ICD-10-CM

## 2020-08-13 DIAGNOSIS — J189 Pneumonia, unspecified organism: Secondary | ICD-10-CM | POA: Diagnosis present

## 2020-08-13 DIAGNOSIS — N171 Acute kidney failure with acute cortical necrosis: Secondary | ICD-10-CM | POA: Diagnosis not present

## 2020-08-13 DIAGNOSIS — T40605A Adverse effect of unspecified narcotics, initial encounter: Secondary | ICD-10-CM | POA: Diagnosis present

## 2020-08-13 DIAGNOSIS — E039 Hypothyroidism, unspecified: Secondary | ICD-10-CM | POA: Diagnosis present

## 2020-08-13 DIAGNOSIS — E870 Hyperosmolality and hypernatremia: Secondary | ICD-10-CM | POA: Diagnosis not present

## 2020-08-13 DIAGNOSIS — J9690 Respiratory failure, unspecified, unspecified whether with hypoxia or hypercapnia: Secondary | ICD-10-CM

## 2020-08-13 DIAGNOSIS — G9340 Encephalopathy, unspecified: Secondary | ICD-10-CM | POA: Diagnosis not present

## 2020-08-13 DIAGNOSIS — Z6841 Body Mass Index (BMI) 40.0 and over, adult: Secondary | ICD-10-CM | POA: Diagnosis not present

## 2020-08-13 DIAGNOSIS — U071 COVID-19: Secondary | ICD-10-CM | POA: Diagnosis present

## 2020-08-13 DIAGNOSIS — E876 Hypokalemia: Secondary | ICD-10-CM | POA: Diagnosis not present

## 2020-08-13 DIAGNOSIS — J384 Edema of larynx: Secondary | ICD-10-CM | POA: Diagnosis not present

## 2020-08-13 DIAGNOSIS — I248 Other forms of acute ischemic heart disease: Secondary | ICD-10-CM | POA: Diagnosis present

## 2020-08-13 DIAGNOSIS — N179 Acute kidney failure, unspecified: Secondary | ICD-10-CM

## 2020-08-13 DIAGNOSIS — B965 Pseudomonas (aeruginosa) (mallei) (pseudomallei) as the cause of diseases classified elsewhere: Secondary | ICD-10-CM | POA: Diagnosis not present

## 2020-08-13 DIAGNOSIS — R0602 Shortness of breath: Secondary | ICD-10-CM

## 2020-08-13 DIAGNOSIS — D638 Anemia in other chronic diseases classified elsewhere: Secondary | ICD-10-CM | POA: Diagnosis present

## 2020-08-13 DIAGNOSIS — J9601 Acute respiratory failure with hypoxia: Secondary | ICD-10-CM | POA: Diagnosis not present

## 2020-08-13 DIAGNOSIS — I422 Other hypertrophic cardiomyopathy: Secondary | ICD-10-CM | POA: Diagnosis present

## 2020-08-13 DIAGNOSIS — R6521 Severe sepsis with septic shock: Secondary | ICD-10-CM | POA: Diagnosis present

## 2020-08-13 DIAGNOSIS — Z9049 Acquired absence of other specified parts of digestive tract: Secondary | ICD-10-CM | POA: Diagnosis not present

## 2020-08-13 DIAGNOSIS — E079 Disorder of thyroid, unspecified: Secondary | ICD-10-CM | POA: Diagnosis present

## 2020-08-13 DIAGNOSIS — G9349 Other encephalopathy: Secondary | ICD-10-CM | POA: Diagnosis present

## 2020-08-13 DIAGNOSIS — J80 Acute respiratory distress syndrome: Secondary | ICD-10-CM | POA: Diagnosis present

## 2020-08-13 DIAGNOSIS — J9600 Acute respiratory failure, unspecified whether with hypoxia or hypercapnia: Secondary | ICD-10-CM | POA: Diagnosis present

## 2020-08-13 DIAGNOSIS — Z4659 Encounter for fitting and adjustment of other gastrointestinal appliance and device: Secondary | ICD-10-CM

## 2020-08-13 DIAGNOSIS — Z789 Other specified health status: Secondary | ICD-10-CM

## 2020-08-13 DIAGNOSIS — Z79899 Other long term (current) drug therapy: Secondary | ICD-10-CM | POA: Diagnosis not present

## 2020-08-13 DIAGNOSIS — A4189 Other specified sepsis: Secondary | ICD-10-CM | POA: Diagnosis present

## 2020-08-13 DIAGNOSIS — Z930 Tracheostomy status: Secondary | ICD-10-CM

## 2020-08-13 DIAGNOSIS — I5031 Acute diastolic (congestive) heart failure: Secondary | ICD-10-CM | POA: Diagnosis not present

## 2020-08-13 DIAGNOSIS — R52 Pain, unspecified: Secondary | ICD-10-CM

## 2020-08-13 DIAGNOSIS — R0902 Hypoxemia: Secondary | ICD-10-CM

## 2020-08-13 DIAGNOSIS — R5381 Other malaise: Secondary | ICD-10-CM | POA: Diagnosis present

## 2020-08-13 HISTORY — DX: Acute kidney failure, unspecified: N17.9

## 2020-08-13 HISTORY — DX: COVID-19: U07.1

## 2020-08-13 HISTORY — DX: Encephalopathy, unspecified: G93.40

## 2020-08-13 HISTORY — DX: Pneumonia due to coronavirus disease 2019: J12.82

## 2020-08-13 LAB — C-REACTIVE PROTEIN: CRP: 12.9 mg/dL — ABNORMAL HIGH (ref <1.0)

## 2020-08-13 LAB — COMPREHENSIVE METABOLIC PANEL
ALT: 108 U/L — ABNORMAL HIGH (ref 0–44)
AST: 333 U/L — ABNORMAL HIGH (ref 15–41)
Albumin: 4.4 g/dL (ref 3.5–5.0)
Alkaline Phosphatase: 77 U/L (ref 38–126)
Anion gap: 12 (ref 5–15)
BUN: 18 mg/dL (ref 6–20)
CO2: 27 mmol/L (ref 22–32)
Calcium: 8.7 mg/dL — ABNORMAL LOW (ref 8.9–10.3)
Chloride: 98 mmol/L (ref 98–111)
Creatinine, Ser: 1.77 mg/dL — ABNORMAL HIGH (ref 0.44–1.00)
GFR calc Af Amer: 40 mL/min — ABNORMAL LOW (ref >60)
GFR calc non Af Amer: 34 mL/min — ABNORMAL LOW (ref >60)
Glucose, Bld: 139 mg/dL — ABNORMAL HIGH (ref 70–99)
Potassium: 3.4 mmol/L — ABNORMAL LOW (ref 3.5–5.1)
Sodium: 137 mmol/L (ref 135–145)
Total Bilirubin: 0.6 mg/dL (ref 0.3–1.2)
Total Protein: 8.7 g/dL — ABNORMAL HIGH (ref 6.5–8.1)

## 2020-08-13 LAB — BLOOD GAS, ARTERIAL
Acid-Base Excess: 6.2 mmol/L — ABNORMAL HIGH (ref 0.0–2.0)
Bicarbonate: 31.2 mmol/L — ABNORMAL HIGH (ref 20.0–28.0)
FIO2: 100
O2 Saturation: 88.6 %
Patient temperature: 37
pCO2 arterial: 47 mmHg (ref 32.0–48.0)
pH, Arterial: 7.43 (ref 7.350–7.450)
pO2, Arterial: 54 mmHg — ABNORMAL LOW (ref 83.0–108.0)

## 2020-08-13 LAB — CBC WITH DIFFERENTIAL/PLATELET
Abs Immature Granulocytes: 0.09 10*3/uL — ABNORMAL HIGH (ref 0.00–0.07)
Basophils Absolute: 0 10*3/uL (ref 0.0–0.1)
Basophils Relative: 0 %
Eosinophils Absolute: 0 10*3/uL (ref 0.0–0.5)
Eosinophils Relative: 0 %
HCT: 25.1 % — ABNORMAL LOW (ref 36.0–46.0)
Hemoglobin: 7.3 g/dL — ABNORMAL LOW (ref 12.0–15.0)
Immature Granulocytes: 1 %
Lymphocytes Relative: 9 %
Lymphs Abs: 0.7 10*3/uL (ref 0.7–4.0)
MCH: 20.2 pg — ABNORMAL LOW (ref 26.0–34.0)
MCHC: 29.1 g/dL — ABNORMAL LOW (ref 30.0–36.0)
MCV: 69.3 fL — ABNORMAL LOW (ref 80.0–100.0)
Monocytes Absolute: 0.4 10*3/uL (ref 0.1–1.0)
Monocytes Relative: 6 %
Neutro Abs: 6.4 10*3/uL (ref 1.7–7.7)
Neutrophils Relative %: 84 %
Platelets: 255 10*3/uL (ref 150–400)
RBC: 3.62 MIL/uL — ABNORMAL LOW (ref 3.87–5.11)
RDW: 21 % — ABNORMAL HIGH (ref 11.5–15.5)
WBC: 7.6 10*3/uL (ref 4.0–10.5)
nRBC: 0.9 % — ABNORMAL HIGH (ref 0.0–0.2)

## 2020-08-13 LAB — GLUCOSE, CAPILLARY
Glucose-Capillary: 133 mg/dL — ABNORMAL HIGH (ref 70–99)
Glucose-Capillary: 168 mg/dL — ABNORMAL HIGH (ref 70–99)

## 2020-08-13 LAB — ACETAMINOPHEN LEVEL: Acetaminophen (Tylenol), Serum: 11 ug/mL (ref 10–30)

## 2020-08-13 LAB — FERRITIN: Ferritin: 84 ng/mL (ref 11–307)

## 2020-08-13 LAB — LACTIC ACID, PLASMA
Lactic Acid, Venous: 2 mmol/L (ref 0.5–1.9)
Lactic Acid, Venous: 1.1 mmol/L (ref 0.5–1.9)

## 2020-08-13 LAB — TROPONIN I (HIGH SENSITIVITY)
Troponin I (High Sensitivity): 40 ng/L — ABNORMAL HIGH (ref <18)
Troponin I (High Sensitivity): 40 ng/L — ABNORMAL HIGH (ref <18)

## 2020-08-13 LAB — BRAIN NATRIURETIC PEPTIDE: B Natriuretic Peptide: 79.6 pg/mL (ref 0.0–100.0)

## 2020-08-13 LAB — FIBRIN DERIVATIVES D-DIMER (ARMC ONLY): Fibrin derivatives D-dimer (ARMC): 1365.88 ng/mL (FEU) — ABNORMAL HIGH (ref 0.00–499.00)

## 2020-08-13 LAB — PROCALCITONIN: Procalcitonin: 0.65 ng/mL

## 2020-08-13 MED ORDER — HEPARIN SODIUM (PORCINE) 5000 UNIT/ML IJ SOLN
5000.0000 [IU] | Freq: Three times a day (TID) | INTRAMUSCULAR | Status: DC
  Administered 2020-08-13 – 2020-09-03 (×62): 5000 [IU] via SUBCUTANEOUS
  Filled 2020-08-13 (×61): qty 1

## 2020-08-13 MED ORDER — SODIUM CHLORIDE 0.9 % IV SOLN
500.0000 mg | INTRAVENOUS | Status: DC
  Administered 2020-08-14 – 2020-08-16 (×4): 500 mg via INTRAVENOUS
  Filled 2020-08-13 (×6): qty 500

## 2020-08-13 MED ORDER — CHLORHEXIDINE GLUCONATE CLOTH 2 % EX PADS
6.0000 | MEDICATED_PAD | Freq: Every day | CUTANEOUS | Status: DC
  Administered 2020-08-15 – 2020-09-04 (×16): 6 via TOPICAL
  Filled 2020-08-13: qty 6

## 2020-08-13 MED ORDER — POTASSIUM CHLORIDE 10 MEQ/100ML IV SOLN
10.0000 meq | INTRAVENOUS | Status: AC
  Administered 2020-08-13 – 2020-08-14 (×3): 10 meq via INTRAVENOUS
  Filled 2020-08-13 (×3): qty 100

## 2020-08-13 MED ORDER — SODIUM CHLORIDE 0.9 % IV SOLN
200.0000 mg | Freq: Once | INTRAVENOUS | Status: AC
  Administered 2020-08-14: 200 mg via INTRAVENOUS
  Filled 2020-08-13: qty 200

## 2020-08-13 MED ORDER — ZINC SULFATE 220 (50 ZN) MG PO CAPS
220.0000 mg | ORAL_CAPSULE | Freq: Every day | ORAL | Status: DC
  Administered 2020-08-13: 220 mg via ORAL
  Filled 2020-08-13: qty 1

## 2020-08-13 MED ORDER — GUAIFENESIN-DM 100-10 MG/5ML PO SYRP
5.0000 mL | ORAL_SOLUTION | ORAL | Status: DC | PRN
  Administered 2020-08-14: 5 mL via ORAL
  Filled 2020-08-13: qty 5

## 2020-08-13 MED ORDER — INSULIN ASPART 100 UNIT/ML ~~LOC~~ SOLN
0.0000 [IU] | SUBCUTANEOUS | Status: DC
  Administered 2020-08-13: 2 [IU] via SUBCUTANEOUS
  Administered 2020-08-14: 1 [IU] via SUBCUTANEOUS
  Administered 2020-08-14: 2 [IU] via SUBCUTANEOUS
  Administered 2020-08-15: 3 [IU] via SUBCUTANEOUS
  Administered 2020-08-15 (×2): 1 [IU] via SUBCUTANEOUS
  Administered 2020-08-15 – 2020-08-16 (×7): 2 [IU] via SUBCUTANEOUS
  Administered 2020-08-16: 1 [IU] via SUBCUTANEOUS
  Administered 2020-08-16: 2 [IU] via SUBCUTANEOUS
  Administered 2020-08-17 (×2): 1 [IU] via SUBCUTANEOUS
  Administered 2020-08-17: 2 [IU] via SUBCUTANEOUS
  Administered 2020-08-17 – 2020-08-18 (×2): 1 [IU] via SUBCUTANEOUS
  Administered 2020-08-18: 2 [IU] via SUBCUTANEOUS
  Administered 2020-08-18 (×2): 1 [IU] via SUBCUTANEOUS
  Administered 2020-08-19: 2 [IU] via SUBCUTANEOUS
  Administered 2020-08-19: 3 [IU] via SUBCUTANEOUS
  Filled 2020-08-13 (×25): qty 1

## 2020-08-13 MED ORDER — ACETAMINOPHEN 500 MG PO TABS
1000.0000 mg | ORAL_TABLET | Freq: Once | ORAL | Status: AC
  Administered 2020-08-13: 1000 mg via ORAL

## 2020-08-13 MED ORDER — ADULT MULTIVITAMIN W/MINERALS CH: 1.0000 | ORAL_TABLET | Freq: Every day | ORAL | Status: DC

## 2020-08-13 MED ORDER — ACETAMINOPHEN 325 MG PO TABS: 650.0000 mg | ORAL_TABLET | Freq: Four times a day (QID) | ORAL | Status: DC | PRN

## 2020-08-13 MED ORDER — ACETAMINOPHEN 650 MG RE SUPP
650.0000 mg | Freq: Once | RECTAL | Status: DC
  Filled 2020-08-13: qty 1

## 2020-08-13 MED ORDER — SODIUM CHLORIDE 0.9 % IV SOLN
100.0000 mg | Freq: Every day | INTRAVENOUS | Status: AC
  Administered 2020-08-14 – 2020-08-17 (×4): 100 mg via INTRAVENOUS
  Filled 2020-08-13: qty 100
  Filled 2020-08-13: qty 20
  Filled 2020-08-13 (×3): qty 100

## 2020-08-13 MED ORDER — ONDANSETRON HCL 4 MG/2ML IJ SOLN: 4.0000 mg | Freq: Four times a day (QID) | INTRAMUSCULAR | Status: DC | PRN

## 2020-08-13 MED ORDER — ASCORBIC ACID 500 MG PO TABS: 500.0000 mg | ORAL_TABLET | Freq: Every day | ORAL | Status: DC

## 2020-08-13 MED ORDER — PROPOFOL 1000 MG/100ML IV EMUL
INTRAVENOUS | Status: AC
  Filled 2020-08-13: qty 100

## 2020-08-13 MED ORDER — THIAMINE HCL 100 MG/ML IJ SOLN
100.0000 mg | Freq: Every day | INTRAMUSCULAR | Status: DC
  Administered 2020-08-13 – 2020-09-01 (×20): 100 mg via INTRAVENOUS
  Filled 2020-08-13 (×21): qty 2

## 2020-08-13 MED ORDER — ZINC SULFATE 220 (50 ZN) MG PO CAPS: 220.0000 mg | ORAL_CAPSULE | Freq: Every day | ORAL | Status: DC

## 2020-08-13 MED ORDER — ALBUTEROL SULFATE HFA 108 (90 BASE) MCG/ACT IN AERS
2.0000 | INHALATION_SPRAY | Freq: Four times a day (QID) | RESPIRATORY_TRACT | Status: DC | PRN
  Filled 2020-08-13: qty 6.7

## 2020-08-13 MED ORDER — FOLIC ACID 5 MG/ML IJ SOLN
1.0000 mg | Freq: Every day | INTRAMUSCULAR | Status: DC
  Administered 2020-08-14 – 2020-08-31 (×18): 1 mg via INTRAVENOUS
  Filled 2020-08-13 (×21): qty 0.2

## 2020-08-13 MED ORDER — BARICITINIB 2 MG PO TABS: 2.0000 mg | ORAL_TABLET | Freq: Every day | ORAL | Status: DC

## 2020-08-13 MED ORDER — ADULT MULTIVITAMIN W/MINERALS CH
1.0000 | ORAL_TABLET | Freq: Every day | ORAL | Status: DC
  Administered 2020-08-13: 1 via ORAL
  Filled 2020-08-13: qty 1

## 2020-08-13 MED ORDER — ASCORBIC ACID 500 MG PO TABS
500.0000 mg | ORAL_TABLET | Freq: Every day | ORAL | Status: DC
  Administered 2020-08-13: 500 mg via ORAL
  Filled 2020-08-13: qty 1

## 2020-08-13 MED ORDER — ACETAMINOPHEN 500 MG PO TABS: 1000.0000 mg | ORAL_TABLET | Freq: Once | ORAL | Status: DC

## 2020-08-13 MED ORDER — ONDANSETRON HCL 4 MG PO TABS: 4.0000 mg | ORAL_TABLET | Freq: Four times a day (QID) | ORAL | Status: DC | PRN

## 2020-08-13 MED ORDER — DEXAMETHASONE SODIUM PHOSPHATE 10 MG/ML IJ SOLN
6.0000 mg | INTRAMUSCULAR | Status: AC
  Administered 2020-08-14 – 2020-08-23 (×10): 6 mg via INTRAVENOUS
  Filled 2020-08-13 (×10): qty 0.6

## 2020-08-13 MED ORDER — FUROSEMIDE 10 MG/ML IJ SOLN
40.0000 mg | Freq: Once | INTRAMUSCULAR | Status: AC
  Administered 2020-08-13: 40 mg via INTRAVENOUS
  Filled 2020-08-13: qty 4

## 2020-08-13 MED ORDER — DEXAMETHASONE SODIUM PHOSPHATE 10 MG/ML IJ SOLN
10.0000 mg | Freq: Once | INTRAMUSCULAR | Status: AC
  Administered 2020-08-13: 10 mg via INTRAVENOUS
  Filled 2020-08-13: qty 1

## 2020-08-13 MED ORDER — SODIUM CHLORIDE 0.9 % IV SOLN
2.0000 g | INTRAVENOUS | Status: DC
  Administered 2020-08-14 – 2020-08-16 (×4): 2 g via INTRAVENOUS
  Filled 2020-08-13: qty 20
  Filled 2020-08-13: qty 2
  Filled 2020-08-13: qty 20
  Filled 2020-08-13 (×3): qty 2

## 2020-08-13 NOTE — ED Provider Notes (Signed)
[facility] Emergency Department Provider Note   ____________________________________________   First MD Initiated Contact with Patient 08/13/20 1748     (approximate)  I have reviewed the triage vital signs and the nursing notes.   HISTORY  Chief Complaint Shortness of Breath    HPI Kathy Vega is a 44 y.o. female with past medical history of anemia and hypothyroidism who presents to the ED complaining of shortness of breath.  History is limited due to patient's difficulty breathing and confusion.  Patient was brought to the ED by her significant other and had initially complained of some difficulty breathing with reported initial O2 sat in the 20s.  She was immediately brought back to a room and placed on a nonrebreather with improvement.  Patient stating only "help me, I cannot breathe."  She also complains of a headache, but denies any chest pain.  She reportedly tested positive for COVID-19 6 days ago and has not yet been vaccinated for Covid.        Past Medical History:  Diagnosis Date  . Anemia   . Thyroid disease     There are no problems to display for this patient.   Past Surgical History:  Procedure Laterality Date  . CHOLECYSTECTOMY    . LEG SURGERY      Prior to Admission medications   Medication Sig Start Date End Date Taking? Authorizing Provider  cyclobenzaprine (FLEXERIL) 10 MG tablet Take 1 tablet (10 mg total) by mouth 3 (three) times daily as needed. 03/26/20   Fisher, Linden Dolin, PA-C  ferrous gluconate (FERGON) 324 MG tablet Take 1 tablet (324 mg total) by mouth 2 (two) times daily with a meal. 03/15/18   [name], MD  ferrous sulfate 325 (65 FE) MG EC tablet Take 325 mg by mouth daily.    [provider]  levothyroxine (SYNTHROID, LEVOTHROID) 125 MCG tablet Take 125 mcg by mouth daily before breakfast.    [provider]  traMADol (ULTRAM) 50 MG tablet Take 1 tablet (50 mg total) by mouth every 6 (six)  hours as needed. 03/26/20   Versie Starks, PA-C    Allergies Patient has no known allergies.  No family history on file.  Social History Social History   Tobacco Use  . Smoking status: Never Smoker  . Smokeless tobacco: Never Used  Substance Use Topics  . Alcohol use: No  . Drug use: Not on file    Review of Systems  Constitutional: No fever/chills Eyes: No visual changes. ENT: No sore throat. Cardiovascular: Denies chest pain. Respiratory: Positive for shortness of breath. Gastrointestinal: No abdominal pain.  No nausea, no vomiting.  No diarrhea.  No constipation. Genitourinary: Negative for dysuria. Musculoskeletal: Negative for back pain. Skin: Negative for rash. Neurological: Positive for headaches, negative for focal weakness or numbness.  ____________________________________________   PHYSICAL EXAM:  VITAL SIGNS: ED Triage Vitals [08/13/20 1748]  Enc Vitals Group     BP (!) 142/81     Pulse Rate (!) 118     Resp (!) 26     Temp (!) 102.5 F (39.2 C)     Temp Source Oral     SpO2 92 %     Weight      Height      Head Circumference      Peak Flow      Pain Score      Pain Loc      Pain Edu?  Excl. in [place]?     Constitutional: Somnolent but arousable to voice. Eyes: Conjunctivae are normal. Head: Atraumatic. Nose: No congestion/rhinnorhea. Mouth/Throat: Mucous membranes are moist. Neck: Normal ROM Cardiovascular: Normal rate, regular rhythm. Grossly normal heart sounds. Respiratory: Tachypneic with increased respiratory effort.  No retractions. Lungs with diffuse crackles. Gastrointestinal: Soft and nontender. No distention. Genitourinary: deferred Musculoskeletal: No lower extremity tenderness nor edema. Neurologic:  Normal speech and language. No gross focal neurologic deficits are appreciated. Skin:  Skin is warm, dry and intact. No rash noted. Psychiatric: Mood and affect are normal. Speech and behavior are  normal.  ____________________________________________   LABS (all labs ordered are listed, but only abnormal results are displayed)  Labs Reviewed  CBC WITH DIFFERENTIAL/PLATELET - Abnormal; Notable for the following components:      Result Value   RBC 3.62 (*)    Hemoglobin 7.3 (*)    HCT 25.1 (*)    MCV 69.3 (*)    MCH 20.2 (*)    MCHC 29.1 (*)    RDW 21.0 (*)    nRBC 0.9 (*)    Abs Immature Granulocytes 0.09 (*)    All other components within normal limits  COMPREHENSIVE METABOLIC PANEL - Abnormal; Notable for the following components:   Potassium 3.4 (*)    Glucose, Bld 139 (*)    Creatinine, Ser 1.77 (*)    Calcium 8.7 (*)    Total Protein 8.7 (*)    AST 333 (*)    ALT 108 (*)    GFR calc non Af Amer 34 (*)    GFR calc Af Amer 40 (*)    All other components within normal limits  BLOOD GAS, VENOUS - Abnormal; Notable for the following components:   Bicarbonate 32.8 (*)    Acid-Base Excess 6.5 (*)    All other components within normal limits  FIBRIN DERIVATIVES D-DIMER ([facility] ONLY) - Abnormal; Notable for the following components:   Fibrin derivatives D-dimer ([facility]) 1,696.78 (*)    All other components within normal limits  LACTIC ACID, PLASMA - Abnormal; Notable for the following components:   Lactic Acid, Venous 2.0 (*)    All other components within normal limits  TROPONIN I (HIGH SENSITIVITY) - Abnormal; Notable for the following components:   Troponin I (High Sensitivity) 40 (*)    All other components within normal limits  TROPONIN I (HIGH SENSITIVITY) - Abnormal; Notable for the following components:   Troponin I (High Sensitivity) 40 (*)    All other components within normal limits  CULTURE, BLOOD (ROUTINE X 2)  CULTURE, BLOOD (ROUTINE X 2)  BRAIN NATRIURETIC PEPTIDE  FERRITIN  LACTIC ACID, PLASMA  URINALYSIS, COMPLETE (UACMP) WITH MICROSCOPIC  URINE DRUG SCREEN, QUALITATIVE ([facility] ONLY)  C-REACTIVE PROTEIN  PROCALCITONIN  BLOOD GAS, ARTERIAL    ____________________________________________  EKG  ED ECG REPORT I, Blake Divine, the attending physician, personally viewed and interpreted this ECG.   Date: 08/13/2020  EKG Time: 17:53  Rate: 117  Rhythm: sinus tachycardia  Axis: Normal  Intervals:none  ST&T Change: None   PROCEDURES  Procedure(s) performed (including Critical Care):  .Critical Care Performed by: Blake Divine, MD Authorized by: Blake Divine, MD   Critical care provider statement:    Critical care time (minutes):  45   Critical care time was exclusive of:  Separately billable procedures and treating other patients and teaching time   Critical care was necessary to treat or prevent imminent or life-threatening deterioration of the following conditions:  Respiratory  failure   Critical care was time spent personally by me on the following activities:  Discussions with consultants, evaluation of patient's response to treatment, examination of patient, ordering and performing treatments and interventions, ordering and review of laboratory studies, ordering and review of radiographic studies, pulse oximetry, re-evaluation of patient's condition, obtaining history from patient or surrogate and review of old charts   I assumed direction of critical care for this patient from another provider in my specialty: no       ____________________________________________   INITIAL IMPRESSION / ASSESSMENT AND PLAN / ED COURSE       44 year old female with past medical history of anemia and hypothyroidism who presents to the ED for increasing difficulty breathing as well as confusion.  History is limited due to her difficulty breathing and confusion, however patient noted to have significant drop in O2 sats on room air.  She was placed on high flow nasal cannula at 50 L/min but remained borderline hypoxic, nonrebreather subsequently added and O2 sats improved.  Her tachypnea has also improved with this.  She tested  positive for COVID-19 6 days ago and her chest x-ray today appears consistent with severe viral pneumonia.  VBG is reassuring and shows no respiratory acidosis or CO2 retention.  Patient remains confused but with no focal neurologic deficits despite improvement in oxygenation.  She also continues to complain of a headache and given this we will perform CT head to ensure no intracranial process.  CT head is negative for acute process and patient has now become gradually more alert.  Speaking with the patient, it seems she has been taking a significant amount of cough syrup containing codeine and this could potentially be contributing to her somnolence.  There was concerned that her O2 sats remained around 80% despite high flow nasal cannula and nonrebreather, however this improved with adjustment of her pulse oximeter.  Patient evaluated by the ICU provider at bedside, who excepts patient for admission.      ____________________________________________   FINAL CLINICAL IMPRESSION(S) / ED DIAGNOSES  Final diagnoses:  Acute respiratory failure with hypoxia ([place])  Pneumonia due to COVID-19 virus     ED Discharge Orders    None       Note:  This document was prepared using Dragon voice recognition software and may include unintentional dictation errors.   Blake Divine, MD 08/13/20 2026

## 2020-08-13 NOTE — ED Triage Notes (Signed)
Pt husband reports she was dx'd with COVID 5 days ago and became confused yesterday.

## 2020-08-13 NOTE — Progress Notes (Signed)
Remdesivir - Pharmacy Brief Note   O:  ALT: 108 CXR:  SpO2: 93 % on 50 ml/min    A/P:  Remdesivir 200 mg IVPB once followed by 100 mg IVPB daily x 4 days.   Narvel Kozub D 08/13/2020 8:55 PM

## 2020-08-13 NOTE — ED Triage Notes (Signed)
Pt via pov from home with covid symptoms. Pt is confused in triage, states, "help me. I can't breathe." Pt had O2 sat in 20's  And down to single digits. [place] at 5 liters raised it to 29%. Pt moved to room 17, placed on NRB at 15L, will be placed on high flow per Dr. Charna Archer. Pt still confused, sometimes uncooperative, but easily settled.

## 2020-08-13 NOTE — H&P (Signed)
Name: Kathy Vega MRN: [id number] DOB: [birth year]    ADMISSION DATE:  08/13/2020  REFERRING MD : Dr. Charna Archer   CHIEF COMPLAINT: Shortness of Breath   BRIEF PATIENT DESCRIPTION: 44 yo unvaccinated female admitted with COVID-19 pneumonia   SIGNIFICANT EVENTS/STUDIES:  09/16: Pt admitted to ICU requiring Heated HFNC and NRB, however remained in the ER pending ICU bed availability   HISTORY OF PRESENT ILLNESS:   This is a 44 yo female with a PMH of Hypothyroidism and Anemia.  She presented to [facility] ER on 09/16 with c/o shortness of breath, headache, and confusion.  Upon arrival to the ER pts O2 sats were in the 20's she was placed on 5L of O2 via nasal canula, however her O2 sats only increased to 29%.  She was diagnosed with COVID-19 6 days prior to ER presentation, and she is unvaccinated.  She was placed on NRB and heated HFNC with increase in O2 sats to 93-95%.  Pt also noted to be confused and uncooperative likely secondary to taking a significant amount of cough syrup with codeine at home.  CT Head negative.  Lab results revealed K+ 3.4, glucose 139, creatinine 1.77, AST 333, ALT 108, troponin 40, crp 12.9, lactic acid 2.0, hgb 7.3, lactic acid 2.0, pct 0.65, d-dimer 1,365.88, abg 7.43/pCO2 47/pO2 54/acid-base excess 6.2, bicarb 31.2.  COVID-19 results pending, however CXR revealed bilateral infiltrates.  PCCM team contacted for ICU admission.  PAST MEDICAL HISTORY :   has a past medical history of Anemia and Thyroid disease.  has a past surgical history that includes Leg Surgery and Cholecystectomy. Prior to Admission medications   Medication Sig Start Date End Date Taking? Authorizing Provider  cyclobenzaprine (FLEXERIL) 10 MG tablet Take 1 tablet (10 mg total) by mouth 3 (three) times daily as needed. 03/26/20   Fisher, Linden Dolin, PA-C  ferrous gluconate (FERGON) 324 MG tablet Take 1 tablet (324 mg total) by mouth 2 (two) times daily with a meal. 03/15/18   [name], MD  ferrous  sulfate 325 (65 FE) MG EC tablet Take 325 mg by mouth daily.    [provider]  levothyroxine (SYNTHROID, LEVOTHROID) 125 MCG tablet Take 125 mcg by mouth daily before breakfast.    [provider]  traMADol (ULTRAM) 50 MG tablet Take 1 tablet (50 mg total) by mouth every 6 (six) hours as needed. 03/26/20   Caryn Section Linden Dolin, PA-C   No Known Allergies  FAMILY HISTORY:  family history is not on file. SOCIAL HISTORY:  reports that she has never smoked. She has never used smokeless tobacco. She reports that she does not drink alcohol.  REVIEW OF SYSTEMS: Positives in BOLD  Constitutional: Negative for fever, chills, weight loss, malaise/fatigue and diaphoresis.  HENT: headache, hearing loss, ear pain, nosebleeds, congestion, sore throat, neck pain, tinnitus and ear discharge.   Eyes: Negative for blurred vision, double vision, photophobia, pain, discharge and redness.  Respiratory: cough, hemoptysis, sputum production, shortness of breath, wheezing and stridor.   Cardiovascular: Negative for chest pain, palpitations, orthopnea, claudication, leg swelling and PND.  Gastrointestinal: Negative for heartburn, nausea, vomiting, abdominal pain, diarrhea, constipation, blood in stool and melena.  Genitourinary: Negative for dysuria, urgency, frequency, hematuria and flank pain.  Musculoskeletal: Negative for myalgias, back pain, joint pain and falls.  Skin: Negative for itching and rash.  Neurological: Negative for dizziness, tingling, tremors, sensory change, speech change, focal weakness, seizures, loss of consciousness, weakness and headaches.  Endo/Heme/Allergies: Negative for environmental  allergies and polydipsia. Does not bruise/bleed easily.  SUBJECTIVE:  No complaints at this time.  VITAL SIGNS: Temp:  [102.5 F (39.2 C)] 102.5 F (39.2 C) (09/16 1748) Pulse Rate:  [107-125] 107 (09/16 2024) Resp:  [16-26] 23 (09/16 2024) BP: (121-144)/(81-86) 125/84 (09/16  2024) SpO2:  [84 %-95 %] 93 % (09/16 2024) FiO2 (%):  [100 %] 100 % (09/16 1758) Weight:  [124.2 kg] 124.2 kg (09/16 1749)  PHYSICAL EXAMINATION: General: acutely ill appearing female, NAD resting in bed  Neuro: intermittently confused to situation and time, alert, follows commands, PERRLA   HEENT: supple, no JVD  Cardiovascular: nsr, rrr, no R/G  Lungs: faint rhonchi throughout, even, non labored Abdomen: +BS x4, obese, soft, non tender, non distended  Musculoskeletal: 1+ bilateral lower extremity edema  Skin: intact no rashes or lesions present   Recent Labs  Lab 08/13/20 1806  NA 137  K 3.4*  CL 98  CO2 27  BUN 18  CREATININE 1.77*  GLUCOSE 139*   Recent Labs  Lab 08/13/20 1806  HGB 7.3*  HCT 25.1*  WBC 7.6  PLT 255   CT Head Wo Contrast  Result Date: 08/13/2020 CLINICAL DATA:  Mental status change EXAM: CT HEAD WITHOUT CONTRAST TECHNIQUE: Contiguous axial images were obtained from the base of the skull through the vertex without intravenous contrast. COMPARISON:  None. FINDINGS: Brain: No evidence of acute territorial infarction, hemorrhage, hydrocephalus,extra-axial collection or mass lesion/mass effect. Normal gray-white differentiation. Ventricles are normal in size and contour. Vascular: No hyperdense vessel or unexpected calcification. Skull: The skull is intact. No fracture or focal lesion identified. Sinuses/Orbits: The visualized paranasal sinuses and mastoid air cells are clear. The orbits and globes intact. Other: None IMPRESSION: No acute intracranial abnormality. Electronically Signed   By: Prudencio Pair M.D.   On: 08/13/2020 19:41   DG Chest Portable 1 View  Result Date: 08/13/2020 CLINICAL DATA:  Dyspnea, COVID pneumonia EXAM: PORTABLE CHEST 1 VIEW COMPARISON:  None. FINDINGS: Lung volumes are small. There are extensive bilateral asymmetric airspace infiltrates noted diffusely most in keeping with acute infection or inflammation. No pneumothorax or pleural  effusion. Cardiac size within normal limits. No acute bone abnormality. IMPRESSION: Extensive bilateral asymmetric airspace infiltrates most in keeping with acute infection or inflammation. Electronically Signed   By: Fidela Salisbury MD   On: 08/13/2020 18:40    ASSESSMENT / PLAN:  Acute hypoxic respiratory failure secondary to COVID-19 pneumonia  Continue High flow [place] maintain saturations between 88 to 92%  Will keep net negative  Continue IV steroids Continue remdisivir + BARIC Continue pulmonary hygiene Increase mobilization, PT/OT as tolerated  Maintain airborne and contact precautions  As needed bronchodilators (MDI) Vitamin C and zinc Antitussives Trend inflammatory markers Trend WBC and monitor fever curve Trend PCT  Follow cultures  Will start azithromycin and ceftriaxone for CAP coverage    Mildly elevated troponin likely demand ischemia in the setting of acute respiratory failure Continuous telemetry monitoring  Repeat troponin in the am   Mild acute renal failure  Trend BMP  Replace electrolytes as indicated  Monitor UOP Avoid nephrotoxic medications   Elevated liver enzymes  Trend hepatic panel if liver enzymes trend up will discontinue remdesivir  Acute hepatic panel, acetaminophen level, and alcohol level pending  [place] RUQ Limited pending   Anemia without obvious acute blood loss Trend CBC Monitor for s/sx of bleeding  Transfuse for hgb <7  Acute encephalopathy likely secondary to narcotic use  Frequent reorientation  Avoid  sedating medications   Best Practice: VTE px: subq heparin and SCD's; if pts hgb continues to decrease will discontinue subq heparin  Diet: keep NPO for now  Code status: Full code  Disposition: ICU Family: Attempted to update pts significant other [name] via telephone, however he did not answer therefore left voicemail message instructing Mr. Dub Mikes to return my phone call  Marda Stalker, [place] Pager [phone] (please enter 7 digits) PCCM Consult Pager [phone] (please enter 7 digits)

## 2020-08-13 NOTE — ED Notes (Signed)
Report given to icu rn

## 2020-08-13 NOTE — ED Notes (Signed)
MD Joni Fears informed of lactic result of 2.0

## 2020-08-14 ENCOUNTER — Inpatient Hospital Stay: Payer: Self-pay

## 2020-08-14 ENCOUNTER — Inpatient Hospital Stay: Payer: Medicaid Other

## 2020-08-14 DIAGNOSIS — J80 Acute respiratory distress syndrome: Secondary | ICD-10-CM

## 2020-08-14 LAB — BLOOD GAS, ARTERIAL
Acid-Base Excess: 4.5 mmol/L — ABNORMAL HIGH (ref 0.0–2.0)
Acid-Base Excess: 7.4 mmol/L — ABNORMAL HIGH (ref 0.0–2.0)
Bicarbonate: 30.2 mmol/L — ABNORMAL HIGH (ref 20.0–28.0)
Bicarbonate: 32 mmol/L — ABNORMAL HIGH (ref 20.0–28.0)
FIO2: 1
FIO2: 100
MECHVT: 460 mL
O2 Saturation: 83.4 %
O2 Saturation: 85.8 %
PEEP: 12 cmH2O
Patient temperature: 37
Patient temperature: 37
RATE: 24 resp/min
pCO2 arterial: 51 mmHg — ABNORMAL HIGH (ref 32.0–48.0)
pCO2 arterial: 45 mmHg (ref 32.0–48.0)
pH, Arterial: 7.38 (ref 7.350–7.450)
pH, Arterial: 7.46 — ABNORMAL HIGH (ref 7.350–7.450)
pO2, Arterial: 49 mmHg — ABNORMAL LOW (ref 83.0–108.0)
pO2, Arterial: 48 mmHg — ABNORMAL LOW (ref 83.0–108.0)

## 2020-08-14 LAB — COMPREHENSIVE METABOLIC PANEL
ALT: 91 U/L — ABNORMAL HIGH (ref 0–44)
AST: 271 U/L — ABNORMAL HIGH (ref 15–41)
Albumin: 4.1 g/dL (ref 3.5–5.0)
Alkaline Phosphatase: 72 U/L (ref 38–126)
Anion gap: 11 (ref 5–15)
BUN: 15 mg/dL (ref 6–20)
CO2: 30 mmol/L (ref 22–32)
Calcium: 8.5 mg/dL — ABNORMAL LOW (ref 8.9–10.3)
Chloride: 99 mmol/L (ref 98–111)
Creatinine, Ser: 1.45 mg/dL — ABNORMAL HIGH (ref 0.44–1.00)
GFR calc Af Amer: 51 mL/min — ABNORMAL LOW (ref >60)
GFR calc non Af Amer: 44 mL/min — ABNORMAL LOW (ref >60)
Glucose, Bld: 154 mg/dL — ABNORMAL HIGH (ref 70–99)
Potassium: 3.7 mmol/L (ref 3.5–5.1)
Sodium: 140 mmol/L (ref 135–145)
Total Bilirubin: 0.7 mg/dL (ref 0.3–1.2)
Total Protein: 8.5 g/dL — ABNORMAL HIGH (ref 6.5–8.1)

## 2020-08-14 LAB — CBC WITH DIFFERENTIAL/PLATELET
Abs Immature Granulocytes: 0.15 10*3/uL — ABNORMAL HIGH (ref 0.00–0.07)
Basophils Absolute: 0 10*3/uL (ref 0.0–0.1)
Basophils Relative: 0 %
Eosinophils Absolute: 0 10*3/uL (ref 0.0–0.5)
Eosinophils Relative: 0 %
HCT: 25.5 % — ABNORMAL LOW (ref 36.0–46.0)
Hemoglobin: 7.6 g/dL — ABNORMAL LOW (ref 12.0–15.0)
Immature Granulocytes: 1 %
Lymphocytes Relative: 6 %
Lymphs Abs: 0.7 10*3/uL (ref 0.7–4.0)
MCH: 20.3 pg — ABNORMAL LOW (ref 26.0–34.0)
MCHC: 29.8 g/dL — ABNORMAL LOW (ref 30.0–36.0)
MCV: 68 fL — ABNORMAL LOW (ref 80.0–100.0)
Monocytes Absolute: 0.3 10*3/uL (ref 0.1–1.0)
Monocytes Relative: 3 %
Neutro Abs: 10 10*3/uL — ABNORMAL HIGH (ref 1.7–7.7)
Neutrophils Relative %: 90 %
Platelets: 248 10*3/uL (ref 150–400)
RBC: 3.75 MIL/uL — ABNORMAL LOW (ref 3.87–5.11)
RDW: 21.2 % — ABNORMAL HIGH (ref 11.5–15.5)
WBC: 11.2 10*3/uL — ABNORMAL HIGH (ref 4.0–10.5)
nRBC: 0.7 % — ABNORMAL HIGH (ref 0.0–0.2)

## 2020-08-14 LAB — HIV ANTIBODY (ROUTINE TESTING W REFLEX): HIV Screen 4th Generation wRfx: NONREACTIVE

## 2020-08-14 LAB — GLUCOSE, CAPILLARY
Glucose-Capillary: 131 mg/dL — ABNORMAL HIGH (ref 70–99)
Glucose-Capillary: 145 mg/dL — ABNORMAL HIGH (ref 70–99)
Glucose-Capillary: 120 mg/dL — ABNORMAL HIGH (ref 70–99)
Glucose-Capillary: 104 mg/dL — ABNORMAL HIGH (ref 70–99)
Glucose-Capillary: 172 mg/dL — ABNORMAL HIGH (ref 70–99)

## 2020-08-14 LAB — PHOSPHORUS: Phosphorus: 3 mg/dL (ref 2.5–4.6)

## 2020-08-14 LAB — MRSA PCR SCREENING: MRSA by PCR: NEGATIVE

## 2020-08-14 LAB — HEPATITIS PANEL, ACUTE
HCV Ab: NONREACTIVE
Hep A IgM: NONREACTIVE
Hep B C IgM: NONREACTIVE
Hepatitis B Surface Ag: NONREACTIVE

## 2020-08-14 LAB — MAGNESIUM: Magnesium: 2.2 mg/dL (ref 1.7–2.4)

## 2020-08-14 LAB — C-REACTIVE PROTEIN: CRP: 14.4 mg/dL — ABNORMAL HIGH (ref <1.0)

## 2020-08-14 LAB — ETHANOL: Alcohol, Ethyl (B): <10 mg/dL (ref <10)

## 2020-08-14 LAB — FERRITIN: Ferritin: 71 ng/mL (ref 11–307)

## 2020-08-14 LAB — TRIGLYCERIDES: Triglycerides: 257 mg/dL — ABNORMAL HIGH (ref <150)

## 2020-08-14 LAB — FIBRIN DERIVATIVES D-DIMER (ARMC ONLY): Fibrin derivatives D-dimer (ARMC): 1590.35 ng/mL (FEU) — ABNORMAL HIGH (ref 0.00–499.00)

## 2020-08-14 MED ORDER — VITAL HIGH PROTEIN PO LIQD
1000.0000 mL | ORAL | Status: DC
  Administered 2020-08-14 – 2020-08-20 (×7): 1000 mL

## 2020-08-14 MED ORDER — GUAIFENESIN-DM 100-10 MG/5ML PO SYRP: 5.0000 mL | ORAL_SOLUTION | ORAL | Status: DC | PRN

## 2020-08-14 MED ORDER — PROPOFOL 1000 MG/100ML IV EMUL
INTRAVENOUS | Status: AC
  Filled 2020-08-14: qty 100

## 2020-08-14 MED ORDER — FENTANYL 2500MCG IN NS 250ML (10MCG/ML) PREMIX INFUSION
INTRAVENOUS | Status: AC
  Filled 2020-08-14: qty 250

## 2020-08-14 MED ORDER — ONDANSETRON HCL 4 MG PO TABS: 4.0000 mg | ORAL_TABLET | Freq: Four times a day (QID) | ORAL | Status: DC | PRN

## 2020-08-14 MED ORDER — FENTANYL CITRATE (PF) 100 MCG/2ML IJ SOLN
200.0000 ug | Freq: Once | INTRAMUSCULAR | Status: AC
  Administered 2020-08-14: 200 ug via INTRAVENOUS

## 2020-08-14 MED ORDER — MIDAZOLAM HCL 2 MG/2ML IJ SOLN
4.0000 mg | Freq: Once | INTRAMUSCULAR | Status: AC
  Administered 2020-08-14: 4 mg via INTRAVENOUS

## 2020-08-14 MED ORDER — ADULT MULTIVITAMIN W/MINERALS CH
1.0000 | ORAL_TABLET | Freq: Every day | ORAL | Status: DC
  Administered 2020-08-15 – 2020-09-03 (×20): 1
  Filled 2020-08-14 (×20): qty 1

## 2020-08-14 MED ORDER — FENTANYL CITRATE (PF) 100 MCG/2ML IJ SOLN
INTRAMUSCULAR | Status: AC
Start: 2020-08-14 — End: 2020-08-14
  Filled 2020-08-14: qty 4

## 2020-08-14 MED ORDER — VECURONIUM BROMIDE 10 MG IV SOLR
20.0000 mg | Freq: Once | INTRAVENOUS | Status: AC
  Administered 2020-08-14: 20 mg via INTRAVENOUS

## 2020-08-14 MED ORDER — VECURONIUM BROMIDE 10 MG IV SOLR
INTRAVENOUS | Status: AC
  Filled 2020-08-14: qty 20

## 2020-08-14 MED ORDER — BARICITINIB 2 MG PO TABS
2.0000 mg | ORAL_TABLET | Freq: Every day | ORAL | Status: DC
  Administered 2020-08-14 – 2020-08-15 (×2): 2 mg
  Filled 2020-08-14 (×2): qty 1

## 2020-08-14 MED ORDER — ORAL CARE MOUTH RINSE
15.0000 mL | OROMUCOSAL | Status: DC
  Administered 2020-08-14 – 2020-09-12 (×261): 15 mL via OROMUCOSAL

## 2020-08-14 MED ORDER — DOCUSATE SODIUM 50 MG/5ML PO LIQD
50.0000 mg | Freq: Two times a day (BID) | ORAL | Status: DC
  Administered 2020-08-15 – 2020-08-24 (×20): 50 mg
  Filled 2020-08-14 (×20): qty 10

## 2020-08-14 MED ORDER — ZINC SULFATE 220 (50 ZN) MG PO CAPS
220.0000 mg | ORAL_CAPSULE | Freq: Every day | ORAL | Status: DC
  Administered 2020-08-15 – 2020-09-03 (×20): 220 mg
  Filled 2020-08-14 (×20): qty 1

## 2020-08-14 MED ORDER — SODIUM CHLORIDE 0.9 % IV SOLN
250.0000 mL | INTRAVENOUS | Status: DC
  Administered 2020-08-14 – 2020-09-07 (×7): 250 mL via INTRAVENOUS

## 2020-08-14 MED ORDER — ONDANSETRON HCL 4 MG/2ML IJ SOLN
4.0000 mg | Freq: Four times a day (QID) | INTRAMUSCULAR | Status: DC | PRN
  Administered 2020-08-31 – 2020-09-07 (×3): 4 mg via INTRAVENOUS
  Filled 2020-08-14 (×3): qty 2

## 2020-08-14 MED ORDER — POLYETHYLENE GLYCOL 3350 17 G PO PACK
17.0000 g | PACK | Freq: Every day | ORAL | Status: DC | PRN
  Administered 2020-08-19 – 2020-08-24 (×3): 17 g
  Filled 2020-08-14 (×4): qty 1

## 2020-08-14 MED ORDER — MIDAZOLAM HCL 2 MG/2ML IJ SOLN
INTRAMUSCULAR | Status: AC
  Filled 2020-08-14: qty 4

## 2020-08-14 MED ORDER — PROPOFOL 1000 MG/100ML IV EMUL
5.0000 ug/kg/min | INTRAVENOUS | Status: DC
  Administered 2020-08-14: 50 ug/kg/min via INTRAVENOUS
  Administered 2020-08-14: 10 ug/kg/min via INTRAVENOUS
  Administered 2020-08-15: 60 ug/kg/min via INTRAVENOUS
  Administered 2020-08-15: 40 ug/kg/min via INTRAVENOUS
  Administered 2020-08-15: 60 ug/kg/min via INTRAVENOUS
  Administered 2020-08-15: 70 ug/kg/min via INTRAVENOUS
  Administered 2020-08-15: 20 ug/kg/min via INTRAVENOUS
  Administered 2020-08-16 (×5): 40 ug/kg/min via INTRAVENOUS
  Administered 2020-08-16: 45 ug/kg/min via INTRAVENOUS
  Administered 2020-08-16 (×2): 40 ug/kg/min via INTRAVENOUS
  Administered 2020-08-17: 55 ug/kg/min via INTRAVENOUS
  Filled 2020-08-14 (×23): qty 100

## 2020-08-14 MED ORDER — FENTANYL 2500MCG IN NS 250ML (10MCG/ML) PREMIX INFUSION
0.0000 ug/h | INTRAVENOUS | Status: DC
  Administered 2020-08-14: 300 ug/h via INTRAVENOUS
  Administered 2020-08-14: 100 ug/h via INTRAVENOUS
  Administered 2020-08-15 (×2): 400 ug/h via INTRAVENOUS
  Administered 2020-08-15: 300 ug/h via INTRAVENOUS
  Administered 2020-08-16 – 2020-08-24 (×28): 400 ug/h via INTRAVENOUS
  Filled 2020-08-14 (×39): qty 250

## 2020-08-14 MED ORDER — CHLORHEXIDINE GLUCONATE 0.12% ORAL RINSE (MEDLINE KIT)
15.0000 mL | Freq: Two times a day (BID) | OROMUCOSAL | Status: DC
  Administered 2020-08-14 – 2020-09-11 (×54): 15 mL via OROMUCOSAL

## 2020-08-14 MED ORDER — ASCORBIC ACID 500 MG PO TABS
500.0000 mg | ORAL_TABLET | Freq: Every day | ORAL | Status: DC
  Administered 2020-08-15 – 2020-09-03 (×20): 500 mg
  Filled 2020-08-14 (×20): qty 1

## 2020-08-14 MED ORDER — SODIUM CHLORIDE 0.9% FLUSH
10.0000 mL | Freq: Two times a day (BID) | INTRAVENOUS | Status: DC
  Administered 2020-08-14 – 2020-08-15 (×2): 10 mL
  Administered 2020-08-15: 20 mL
  Administered 2020-08-16: 10 mL
  Administered 2020-08-16: 20 mL
  Administered 2020-08-17: 30 mL
  Administered 2020-08-17 – 2020-08-23 (×13): 10 mL
  Administered 2020-08-24: 40 mL
  Administered 2020-08-24 – 2020-08-25 (×2): 10 mL
  Administered 2020-08-25 – 2020-08-26 (×2): 20 mL
  Administered 2020-08-26 – 2020-08-27 (×2): 10 mL
  Administered 2020-08-27: 30 mL
  Administered 2020-08-28 – 2020-09-04 (×15): 10 mL
  Administered 2020-09-05: 30 mL
  Administered 2020-09-05 – 2020-09-06 (×3): 10 mL

## 2020-08-14 MED ORDER — NOREPINEPHRINE 4 MG/250ML-% IV SOLN
2.0000 ug/min | INTRAVENOUS | Status: DC
  Administered 2020-08-14: 5 ug/min via INTRAVENOUS
  Administered 2020-08-14: 7 ug/min via INTRAVENOUS
  Administered 2020-08-17: 3 ug/min via INTRAVENOUS
  Administered 2020-08-18 (×2): 6 ug/min via INTRAVENOUS
  Filled 2020-08-14 (×6): qty 250

## 2020-08-14 MED ORDER — SODIUM CHLORIDE 0.9% FLUSH: 10.0000 mL | INTRAVENOUS | Status: DC | PRN

## 2020-08-14 NOTE — Progress Notes (Signed)
Assisted tele visit to patient with daughter.  Arrie Borrelli M Deny Chevez, RN   

## 2020-08-14 NOTE — Progress Notes (Signed)
Pt intubated at this time. 4 of versed, 100 of fentanyl, 20 of vec given for intubation.

## 2020-08-14 NOTE — Procedures (Signed)
Endotracheal Intubation: Patient required placement of an artificial airway secondary to Respiratory Failure  Consent: Emergent.   Hand washing performed prior to starting the procedure.   Medications administered for sedation prior to procedure:  Midazolam 4 mg IV,  Vecuronium 10 mg IV, Fentanyl 100 mcg IV.    A time out procedure was called and correct patient, name, & ID confirmed. Needed supplies and equipment were assembled and checked to include ETT, 10 ml syringe, Glidescope, Mac and Miller blades, suction, oxygen and bag mask valve, end tidal CO2 monitor.   Patient was positioned to align the mouth and pharynx to facilitate visualization of the glottis.   Heart rate, SpO2 and blood pressure was continuously monitored during the procedure. Pre-oxygenation was conducted prior to intubation and endotracheal tube was placed through the vocal cords into the trachea.     The artificial airway was placed under direct visualization via glidescope route using a 8.5 ETT on the first attempt.  ETT was secured at 25 cm mark.  Placement was confirmed by auscuitation of lungs with good breath sounds bilaterally and no stomach sounds.  Condensation was noted on endotracheal tube.   Pulse ox 78%.  CO2 detector in place with appropriate color change.   Complications: None .   Operator: Yaziel Brandon.   Chest radiograph ordered and pending.   Comments: OGT placed via glidescope.  Corrin Parker, M.D.  [facility]  Medical Director [place] Director [facility] Cardio-Pulmonary Department

## 2020-08-14 NOTE — Progress Notes (Signed)
Pt proned at this time 

## 2020-08-14 NOTE — Progress Notes (Signed)
Assisted tele visit to patient with daughter.  Margaret Pyle, RN

## 2020-08-14 NOTE — Progress Notes (Signed)
Assisted tele visit to patient with daughter.  Raedyn Wenke M Violetta Lavalle, RN   

## 2020-08-14 NOTE — Consult Note (Signed)
[place] for Electrolyte Monitoring and Replacement   Recent Labs: Potassium (mmol/L)  Date Value  08/14/2020 3.7  03/03/2014 3.9   Magnesium (mg/dL)  Date Value  08/14/2020 2.2   Calcium (mg/dL)  Date Value  08/14/2020 8.5 (L)   Calcium, Total (mg/dL)  Date Value  03/03/2014 9.3   Albumin (g/dL)  Date Value  08/14/2020 4.1  03/03/2014 4.2   Phosphorus (mg/dL)  Date Value  08/14/2020 3.0   Sodium (mmol/L)  Date Value  08/14/2020 140  03/03/2014 140     Assessment: Patient is a 44 year old morbidly obese unvaccinated female admitted for acute hypoxemic respiratory failure secondary to COVID-19 pneumonia.Scr elevated to 1.77 on admission, no chronic kidney impairment noted. Pharmacy has been consulted for electrolyte monitoring.   9/16 K 3.4, replaced with KCl 10 mEq x3 runs.  9/17 K 3.7, other electrolytes WNL. Pt intubated today not currently on tube feeds or maintenance fluids. Scr 1.77 >> 1.45 and 1200 mL UOP yesterday.   Goal of Therapy:  Electrolytes within normal limits  Plan:  -No further replacement indicated at this time -Will recheck electrolytes with AM labs  Jacobo Forest PharmD Candidate 2022 08/14/2020 10:29 AM

## 2020-08-14 NOTE — Progress Notes (Signed)
Attempted to contact pts daughter Mayli Covington via telephone to inform her of pts prognosis and to discuss code status/goals of treatment.  However, pts daughter did not answer the telephone therefore I left a voicemail instructing her to return my phone call.  Pt HIGH RISK for mechanical intubation and sudden death will continue to monitor and assess pt.  Marda Stalker, [place] Pager [phone] (please enter 7 digits) PCCM Consult Pager [phone] (please enter 7 digits)

## 2020-08-14 NOTE — Progress Notes (Signed)
Peripherally Inserted Central Catheter Placement  The IV Nurse has discussed with the patient and/or persons authorized to consent for the patient, the purpose of this procedure and the potential benefits and risks involved with this procedure.  The benefits include less needle sticks, lab draws from the catheter, and the patient may be discharged home with the catheter. Risks include, but not limited to, infection, bleeding, blood clot (thrombus formation), and puncture of an artery; nerve damage and irregular heartbeat and possibility to perform a PICC exchange if needed/ordered by physician.  Alternatives to this procedure were also discussed.  Bard Power PICC patient education guide, fact sheet on infection prevention and patient information card has been provided to patient /or left at bedside.    PICC Placement Documentation  PICC Double Lumen 08/14/20 PICC Left Brachial 46 cm 0 cm (Active)  Indication for Insertion or Continuance of Line Prolonged intravenous therapies 08/14/20 1351  Exposed Catheter (cm) 0 cm 08/14/20 1351  Site Assessment Clean;Dry;Intact 08/14/20 1351  Lumen #1 Status Flushed;Blood return noted;Saline locked 08/14/20 1351  Lumen #2 Status Flushed;Blood return noted;Saline locked 08/14/20 1351  Dressing Type Transparent 08/14/20 1351  Dressing Status Clean;Dry;Intact 08/14/20 1351       Valentina Shaggy Ramos 08/14/2020, 1:54 PM

## 2020-08-14 NOTE — Progress Notes (Signed)
eLink Physician-Brief Progress Note Patient Name: Kathy Vega DOB: 1976-11-14 MRN: [id number]   Date of Service  08/14/2020  HPI/Events of Note  Patient is a 44 year old morbidly obese, unvaccinated female who is admitted with acute hypoxemic respiratory failure secondary to Covid 19 pneumonia, she is self proning in bed on assessment via camera.  eICU Interventions  New Patient Evaluation completed.        Kathy Vega 08/14/2020, 12:02 AM

## 2020-08-14 NOTE — Progress Notes (Signed)
Initial Nutrition Assessment  DOCUMENTATION CODES:   Obesity unspecified  INTERVENTION:  Initiate Vital High Protein at 65 mL/hr (1560 mL goal daily volume) per tube. Provides 1560 kcal, 137 grams of protein, 1310 mL H2O daily. With current propofol rate provides 1747 kcal daily.  NUTRITION DIAGNOSIS:   Inadequate oral intake related to inability to eat as evidenced by NPO status.  GOAL:   Patient will meet greater than or equal to 90% of their needs  MONITOR:   Vent status, Labs, Weight trends, TF tolerance, I & O's  REASON FOR ASSESSMENT:   Ventilator, Consult Enteral/tube feeding initiation and management  ASSESSMENT:   44 year old female admitted with COVID-19 PNA.   9/17 intubated  Patient is currently intubated on ventilator support MV: 11 L/min Temp (24hrs), Avg:99.6 F (37.6 C), Min:98.3 F (36.8 C), Max:102.5 F (39.2 C)  Propofol: 7.1 ml/hr (187 kcal daily)  Medications reviewed and include: vitamin C 500 mg daily, Decadron 6 mg Q24hrs IV, Colace 50 mg BID per tube, Novolog 0-9 units Q4hrs, MVI daily, thiamine 100 mg daily, zinc sulfate 220 mg daily, azithromycin, ceftriaxone, fentanyl gtt, propofol gtt, remdesivir.  Labs reviewed: CBG 120, Creatinine 1.45.  I/O: 1200 mL UOP yesterday (0.8 mL/kg/hr)  Enteral Access: 18 Fr. OGT placed 9/17; terminates in stomach per abdominal x-ray 9/17  Discussed with RN and on rounds. Plan is to prone today. Starting tube feeds today.  NUTRITION - FOCUSED PHYSICAL EXAM:  Deferred.  Diet Order:   Diet Order            Diet NPO time specified  Diet effective now                EDUCATION NEEDS:   No education needs have been identified at this time  Skin:  Skin Assessment: Reviewed RN Assessment  Last BM:  Unknown/PTA  Height:   Ht Readings from Last 1 Encounters:  08/13/20 5\' 9"  (1.753 m)   Weight:   Wt Readings from Last 1 Encounters:  08/13/20 119.2 kg   Ideal Body Weight:  65.9 kg  BMI:   Body mass index is 38.81 kg/m.  Estimated Nutritional Needs:   Kcal:  0539  Protein:  135-145 grams  Fluid:  >/= 2 L/day  Jacklynn Barnacle, MS, RD, LDN Pager number available on Amion

## 2020-08-15 DIAGNOSIS — N171 Acute kidney failure with acute cortical necrosis: Secondary | ICD-10-CM

## 2020-08-15 LAB — CBC WITH DIFFERENTIAL/PLATELET
Abs Immature Granulocytes: 0.17 10*3/uL — ABNORMAL HIGH (ref 0.00–0.07)
Basophils Absolute: 0 10*3/uL (ref 0.0–0.1)
Basophils Relative: 0 %
Eosinophils Absolute: 0 10*3/uL (ref 0.0–0.5)
Eosinophils Relative: 0 %
HCT: 22.7 % — ABNORMAL LOW (ref 36.0–46.0)
Hemoglobin: 7 g/dL — ABNORMAL LOW (ref 12.0–15.0)
Immature Granulocytes: 2 %
Lymphocytes Relative: 4 %
Lymphs Abs: 0.4 10*3/uL — ABNORMAL LOW (ref 0.7–4.0)
MCH: 21.1 pg — ABNORMAL LOW (ref 26.0–34.0)
MCHC: 30.8 g/dL (ref 30.0–36.0)
MCV: 68.6 fL — ABNORMAL LOW (ref 80.0–100.0)
Monocytes Absolute: 0.2 10*3/uL (ref 0.1–1.0)
Monocytes Relative: 3 %
Neutro Abs: 8.7 10*3/uL — ABNORMAL HIGH (ref 1.7–7.7)
Neutrophils Relative %: 91 %
Platelets: 253 10*3/uL (ref 150–400)
RBC: 3.31 MIL/uL — ABNORMAL LOW (ref 3.87–5.11)
RDW: 21.1 % — ABNORMAL HIGH (ref 11.5–15.5)
WBC: 9.5 10*3/uL (ref 4.0–10.5)
nRBC: 1.5 % — ABNORMAL HIGH (ref 0.0–0.2)

## 2020-08-15 LAB — BLOOD GAS, ARTERIAL
Acid-Base Excess: 5.6 mmol/L — ABNORMAL HIGH (ref 0.0–2.0)
Bicarbonate: 30.6 mmol/L — ABNORMAL HIGH (ref 20.0–28.0)
FIO2: 1
MECHVT: 460 mL
O2 Saturation: 91.9 %
PEEP: 12 cmH2O
Patient temperature: 37
RATE: 24 resp/min
pCO2 arterial: 45 mmHg (ref 32.0–48.0)
pH, Arterial: 7.44 (ref 7.350–7.450)
pO2, Arterial: 61 mmHg — ABNORMAL LOW (ref 83.0–108.0)

## 2020-08-15 LAB — GLUCOSE, CAPILLARY
Glucose-Capillary: 137 mg/dL — ABNORMAL HIGH (ref 70–99)
Glucose-Capillary: 140 mg/dL — ABNORMAL HIGH (ref 70–99)
Glucose-Capillary: 155 mg/dL — ABNORMAL HIGH (ref 70–99)
Glucose-Capillary: 160 mg/dL — ABNORMAL HIGH (ref 70–99)
Glucose-Capillary: 179 mg/dL — ABNORMAL HIGH (ref 70–99)
Glucose-Capillary: 190 mg/dL — ABNORMAL HIGH (ref 70–99)
Glucose-Capillary: 215 mg/dL — ABNORMAL HIGH (ref 70–99)

## 2020-08-15 LAB — BLOOD GAS, VENOUS
Acid-Base Excess: 5.2 mmol/L — ABNORMAL HIGH (ref 0.0–2.0)
Bicarbonate: 29.9 mmol/L — ABNORMAL HIGH (ref 20.0–28.0)
FIO2: 1
MECHVT: 460 mL
Mechanical Rate: 24
O2 Saturation: 58.2 %
PEEP: 12 cmH2O
Patient temperature: 37
RATE: 24 resp/min
pCO2, Ven: 44 mmHg (ref 44.0–60.0)
pH, Ven: 7.44 — ABNORMAL HIGH (ref 7.250–7.430)
pO2, Ven: <31 mmHg — CL (ref 32.0–45.0)

## 2020-08-15 LAB — FERRITIN: Ferritin: 62 ng/mL (ref 11–307)

## 2020-08-15 LAB — COMPREHENSIVE METABOLIC PANEL
ALT: 69 U/L — ABNORMAL HIGH (ref 0–44)
AST: 141 U/L — ABNORMAL HIGH (ref 15–41)
Albumin: 3.3 g/dL — ABNORMAL LOW (ref 3.5–5.0)
Alkaline Phosphatase: 66 U/L (ref 38–126)
Anion gap: 12 (ref 5–15)
BUN: 22 mg/dL — ABNORMAL HIGH (ref 6–20)
CO2: 28 mmol/L (ref 22–32)
Calcium: 8.1 mg/dL — ABNORMAL LOW (ref 8.9–10.3)
Chloride: 104 mmol/L (ref 98–111)
Creatinine, Ser: 1.22 mg/dL — ABNORMAL HIGH (ref 0.44–1.00)
GFR calc Af Amer: >60 mL/min (ref >60)
GFR calc non Af Amer: 54 mL/min — ABNORMAL LOW (ref >60)
Glucose, Bld: 210 mg/dL — ABNORMAL HIGH (ref 70–99)
Potassium: 3.6 mmol/L (ref 3.5–5.1)
Sodium: 144 mmol/L (ref 135–145)
Total Bilirubin: 0.6 mg/dL (ref 0.3–1.2)
Total Protein: 7.1 g/dL (ref 6.5–8.1)

## 2020-08-15 LAB — FIBRIN DERIVATIVES D-DIMER (ARMC ONLY): Fibrin derivatives D-dimer (ARMC): 3358.48 ng/mL (FEU) — ABNORMAL HIGH (ref 0.00–499.00)

## 2020-08-15 LAB — PHOSPHORUS: Phosphorus: 2.3 mg/dL — ABNORMAL LOW (ref 2.5–4.6)

## 2020-08-15 LAB — LACTIC ACID, PLASMA
Lactic Acid, Venous: 2 mmol/L (ref 0.5–1.9)
Lactic Acid, Venous: 1.8 mmol/L (ref 0.5–1.9)

## 2020-08-15 LAB — MAGNESIUM: Magnesium: 2.3 mg/dL (ref 1.7–2.4)

## 2020-08-15 LAB — C-REACTIVE PROTEIN: CRP: 19.7 mg/dL — ABNORMAL HIGH (ref <1.0)

## 2020-08-15 MED ORDER — K PHOS MONO-SOD PHOS DI & MONO 155-852-130 MG PO TABS
500.0000 mg | ORAL_TABLET | ORAL | Status: AC
  Administered 2020-08-15: 500 mg
  Filled 2020-08-15: qty 2

## 2020-08-15 MED ORDER — MIDAZOLAM HCL 2 MG/2ML IJ SOLN
INTRAMUSCULAR | Status: AC
  Administered 2020-08-15: 2 mg via INTRAVENOUS
  Filled 2020-08-15: qty 2

## 2020-08-15 MED ORDER — MIDAZOLAM HCL 2 MG/2ML IJ SOLN: 2.0000 mg | Freq: Once | INTRAMUSCULAR | Status: AC

## 2020-08-15 MED ORDER — VECURONIUM BROMIDE 10 MG IV SOLR
INTRAVENOUS | Status: AC
  Administered 2020-08-15: 10 mg via INTRAVENOUS
  Filled 2020-08-15: qty 10

## 2020-08-15 MED ORDER — VECURONIUM BROMIDE 10 MG IV SOLR: 10.0000 mg | Freq: Once | INTRAVENOUS | Status: AC

## 2020-08-15 MED ORDER — K PHOS MONO-SOD PHOS DI & MONO 155-852-130 MG PO TABS
500.0000 mg | ORAL_TABLET | ORAL | Status: DC
  Administered 2020-08-15: 500 mg via ORAL
  Filled 2020-08-15 (×2): qty 2

## 2020-08-15 MED ORDER — BARICITINIB 2 MG PO TABS
4.0000 mg | ORAL_TABLET | Freq: Every day | ORAL | Status: AC
  Administered 2020-08-16 – 2020-08-25 (×10): 4 mg
  Filled 2020-08-15 (×10): qty 2

## 2020-08-15 NOTE — Progress Notes (Signed)
Assisted video call with daughter.

## 2020-08-15 NOTE — Progress Notes (Signed)
Pharmacy Note  Baricitinib adjusted from 2 mg to 4 mg for eGFR >60 ml/min Improvement in renal function

## 2020-08-15 NOTE — Progress Notes (Signed)
Assisted tele visit to patient with family member.  Fabricio Endsley Ann, RN  

## 2020-08-15 NOTE — Progress Notes (Signed)
Pharmacy Electrolyte Monitoring Consult:  Pharmacy consulted to assist in monitoring and replacing electrolytes in this 44 y.o. female admitted on 08/13/2020 with Shortness of Breath   Labs:  Sodium (mmol/L)  Date Value  08/15/2020 144  03/03/2014 140   Potassium (mmol/L)  Date Value  08/15/2020 3.6  03/03/2014 3.9   Magnesium (mg/dL)  Date Value  08/15/2020 2.3   Phosphorus (mg/dL)  Date Value  08/15/2020 2.3 (L)   Calcium (mg/dL)  Date Value  08/15/2020 8.1 (L)   Calcium, Total (mg/dL)  Date Value  03/03/2014 9.3   Albumin (g/dL)  Date Value  08/15/2020 3.3 (L)  03/03/2014 4.2    Assessment: 44 yo female here with COVID PNA  Goal: Electrolyte WNL  Plan: Phos 2.3 - will order NeutraPhos 2 tab q4h x2 doses  F/u labs in AM   Rayna Sexton L 08/15/2020 11:23 AM

## 2020-08-15 NOTE — Progress Notes (Signed)
Assisted tele visit to patient with family member.  Eleonor Ocon Ann, RN  

## 2020-08-15 NOTE — Progress Notes (Signed)
CRITICAL CARE NOTE    CC  follow up respiratory failure  SUBJECTIVE Patient remains critically ill Prognosis is guarded   BP 117/87   Pulse 88   Temp (!) 96 F (35.6 C) (Axillary)   Resp (!) 24   Ht 5\' 9"  (1.753 m)   Wt 119.2 kg   LMP  (LMP Unknown)   SpO2 96%   BMI 38.81 kg/m    I/O last 3 completed shifts: In: 1479.1 [I.V.:494.4; IV Piggyback:984.7] Out: 2550 [Urine:2550] Total I/O In: -  Out: 400 [Urine:400]  SpO2: 96 % O2 Flow Rate (L/min): 50 L/min FiO2 (%): 100 %  Estimated body mass index is 38.81 kg/m as calculated from the following:   Height as of this encounter: 5\' 9"  (1.753 m).   Weight as of this encounter: 119.2 kg.  SIGNIFICANT EVENTS   REVIEW OF SYSTEMS  PATIENT IS UNABLE TO PROVIDE COMPLETE REVIEW OF SYSTEMS DUE TO SEVERE CRITICAL ILLNESS      COVID-19 DISASTER DECLARATION:   FULL CONTACT PHYSICAL EXAMINATION WAS NOT POSSIBLE DUE TO TREATMENT OF COVID-19  AND CONSERVATION OF PERSONAL PROTECTIVE EQUIPMENT, LIMITED EXAM FINDINGS INCLUDE-   PHYSICAL EXAMINATION:  GENERAL:critically ill appearing, +resp distress NEUROLOGIC: obtunded, GCS<8   Patient assessed or the symptoms described in the history of present illness.  In the context of the Global COVID-19 pandemic, which necessitated consideration that the patient might be at risk for infection with the SARS-CoV-2 virus that causes COVID-19, Institutional protocols and algorithms that pertain to the evaluation of patients at risk for COVID-19 are in a state of rapid change based on information released by regulatory bodies including the CDC and federal and state organizations. These policies and algorithms were followed during the patient's care while in hospital.    MEDICATIONS: I have reviewed all medications and confirmed regimen as documented   CULTURE RESULTS   Recent Results (from the past 240 hour(s))  Culture, blood (routine x 2)     Status: None (Preliminary result)    Collection Time: 08/13/20  6:06 PM   Specimen: BLOOD  Result Value Ref Range Status   Specimen Description BLOOD RIGHT ANTECUBITAL  Final   Special Requests   Final    BOTTLES DRAWN AEROBIC AND ANAEROBIC Blood Culture adequate volume   Culture   Final    NO GROWTH 2 DAYS Performed at [facility], [address] [place], [place] [address]    Report Status PENDING  Incomplete  Culture, blood (routine x 2)     Status: None (Preliminary result)   Collection Time: 08/13/20  6:06 PM   Specimen: BLOOD  Result Value Ref Range Status   Specimen Description BLOOD LEFT ANTECUBITAL  Final   Special Requests   Final    BOTTLES DRAWN AEROBIC AND ANAEROBIC Blood Culture adequate volume   Culture   Final    NO GROWTH 2 DAYS Performed at [facility], [address], [place], [place] [address]    Report Status PENDING  Incomplete  MRSA PCR Screening     Status: None   Collection Time: 08/13/20 11:34 PM   Specimen: Nasal Mucosa; Nasopharyngeal  Result Value Ref Range Status   MRSA by PCR NEGATIVE NEGATIVE Final    Comment:        The GeneXpert MRSA Assay (FDA approved for NASAL specimens only), is one component of a comprehensive MRSA colonization surveillance program. It is not intended to diagnose MRSA infection nor to guide or monitor treatment for MRSA  infections. Performed at [facility], [address] [place], [place] [address]           IMAGING    [place] EKG SITE RITE  Result Date: 08/14/2020 If [facility] image not attached, placement could not be confirmed due to current cardiac rhythm.  US Abdomen Limited RUQ  Result Date: 08/14/2020 CLINICAL DATA:  Elevated liver enzymes EXAM: ULTRASOUND ABDOMEN LIMITED RIGHT UPPER QUADRANT COMPARISON:  Radiograph 08/14/2020 FINDINGS: Gallbladder: Surgically absent Common bile duct: Diameter: 3.8 mm, nondilated Liver: Mild hepatomegaly. Hepatic echogenicity within normal limits. No focal concerning liver  lesion. Portal vein is patent on color Doppler imaging with normal direction of blood flow towards the liver. Other: None. IMPRESSION: 1. Mild hepatomegaly.  No focal lesions. 2. Post cholecystectomy. No biliary ductal dilatation. Electronically Signed   By: Lovena Le M.D.   On: 08/14/2020 19:18     Nutrition Status: Nutrition Problem: Inadequate oral intake Etiology: inability to eat Signs/Symptoms: NPO status Interventions: Tube feeding     Indwelling Urinary Catheter continued, requirement due to   Reason to continue Indwelling Urinary Catheter strict Intake/Output monitoring for hemodynamic instability   Central Line/ continued, requirement due to  Reason to continue [place] of central venous pressure or other hemodynamic parameters and poor IV access   Ventilator continued, requirement due to severe respiratory failure   Ventilator Sedation RASS 0 to -2      ASSESSMENT AND PLAN SYNOPSIS  Acute hypoxemic respiratory failure due to COVID-19 pneumonia / ARDS Mechanical ventilation via ARDS protocol, target PRVC 6 cc/kg Wean PEEP and FiO2 as able Goal plateau pressure less than 30, driving pressure less than 15 Paralytics if necessary for vent synchrony, gas exchange Cycle prone positioning if necessary for oxygenation Deep sedation per PAD protocol, goal RASS -4, currently fentanyl, midazolam Diuresis as blood pressure and renal function can tolerate, goal CVP 5-8.   diuresis as tolerated based on Kidney function VAP prevention order set Remdesivir + BARIC IV STEROIDS  Follow inflammatory markers: Ferritin, D-dimer, CRP, IL-6, LDH Vitamin C, zinc Plan to repeat and check resp cultures    Severe ACUTE Hypoxic and Hypercapnic Respiratory Failure -continue Full MV support -continue Bronchodilator Therapy -Wean Fio2 and PEEP as tolerated -VAP/VENT bundle implementation  Morbid obesity, possible OSA.   Will certainly impact respiratory mechanics,  ventilator weaning Suspect will need to consider additional PEEP    NEUROLOGY Acute toxic metabolic encephalopathy, need for sedation Goal RASS -2 to -3  SHOCK-SEPSIS DUE TO COVID 19 pneumonia present upon admission -use vasopressors to keep MAP>65  CARDIAC ICU monitoring  ID -continue IV abx as prescibed -follow up cultures  GI GI PROPHYLAXIS as indicated   DIET-->TF's as tolerated Constipation protocol as indicated  ENDO - will use ICU hypoglycemic\Hyperglycemia protocol if indicated     ELECTROLYTES -follow labs as needed -replace as needed -pharmacy consultation and following   DVT/GI PRX ordered and assessed TRANSFUSIONS AS NEEDED MONITOR FSBS I Assessed the need for Labs I Assessed the need for Foley I Assessed the need for Central Venous Line Family Discussion when available I Assessed the need for Mobilization I made an Assessment of medications to be adjusted accordingly Safety Risk assessment completed   CASE DISCUSSED IN MULTIDISCIPLINARY ROUNDS WITH ICU TEAM  Critical Care Time devoted to patient care services described in this note is 45 minutes.   Overall, patient is critically ill, prognosis is guarded.  Patient with Multiorgan failure and at high risk for cardiac arrest  and death.   [facility] REFUSED TO ACCEPT TOR ECMO, BUT PATIENT MAY BE A CANDIDATE AFTER RE_ASSESSMENT  Corrin Parker, M.D.  [facility]  Medical Director [place] Director [facility] Cardio-Pulmonary Department

## 2020-08-16 ENCOUNTER — Inpatient Hospital Stay: Payer: Medicaid Other

## 2020-08-16 LAB — BLOOD GAS, ARTERIAL
Acid-Base Excess: 2.3 mmol/L — ABNORMAL HIGH (ref 0.0–2.0)
Bicarbonate: 27.3 mmol/L (ref 20.0–28.0)
FIO2: 100
MECHVT: 460 mL
O2 Saturation: 98.8 %
PEEP: 12 cmH2O
Patient temperature: 37
RATE: 24 resp/min
pCO2 arterial: 44 mmHg (ref 32.0–48.0)
pH, Arterial: 7.4 (ref 7.350–7.450)
pO2, Arterial: 125 mmHg — ABNORMAL HIGH (ref 83.0–108.0)

## 2020-08-16 LAB — CBC WITH DIFFERENTIAL/PLATELET
Abs Immature Granulocytes: 0.27 10*3/uL — ABNORMAL HIGH (ref 0.00–0.07)
Basophils Absolute: 0 10*3/uL (ref 0.0–0.1)
Basophils Relative: 0 %
Eosinophils Absolute: 0 10*3/uL (ref 0.0–0.5)
Eosinophils Relative: 0 %
HCT: 20.1 % — ABNORMAL LOW (ref 36.0–46.0)
Hemoglobin: 6.8 g/dL — ABNORMAL LOW (ref 12.0–15.0)
Immature Granulocytes: 2 %
Lymphocytes Relative: 4 %
Lymphs Abs: 0.4 10*3/uL — ABNORMAL LOW (ref 0.7–4.0)
MCH: 23.7 pg — ABNORMAL LOW (ref 26.0–34.0)
MCHC: 33.8 g/dL (ref 30.0–36.0)
MCV: 70 fL — ABNORMAL LOW (ref 80.0–100.0)
Monocytes Absolute: 0.2 10*3/uL (ref 0.1–1.0)
Monocytes Relative: 2 %
Neutro Abs: 10.7 10*3/uL — ABNORMAL HIGH (ref 1.7–7.7)
Neutrophils Relative %: 92 %
Platelets: 200 10*3/uL (ref 150–400)
RBC: 2.87 MIL/uL — ABNORMAL LOW (ref 3.87–5.11)
RDW: 21.5 % — ABNORMAL HIGH (ref 11.5–15.5)
WBC: 11.6 10*3/uL — ABNORMAL HIGH (ref 4.0–10.5)
nRBC: 1.6 % — ABNORMAL HIGH (ref 0.0–0.2)

## 2020-08-16 LAB — COMPREHENSIVE METABOLIC PANEL
ALT: 55 U/L — ABNORMAL HIGH (ref 0–44)
AST: 81 U/L — ABNORMAL HIGH (ref 15–41)
Albumin: 2.8 g/dL — ABNORMAL LOW (ref 3.5–5.0)
Alkaline Phosphatase: 57 U/L (ref 38–126)
Anion gap: 16 — ABNORMAL HIGH (ref 5–15)
BUN: 21 mg/dL — ABNORMAL HIGH (ref 6–20)
CO2: 22 mmol/L (ref 22–32)
Calcium: 7 mg/dL — ABNORMAL LOW (ref 8.9–10.3)
Chloride: 104 mmol/L (ref 98–111)
Creatinine, Ser: 0.95 mg/dL (ref 0.44–1.00)
GFR calc Af Amer: >60 mL/min (ref >60)
GFR calc non Af Amer: >60 mL/min (ref >60)
Glucose, Bld: 180 mg/dL — ABNORMAL HIGH (ref 70–99)
Potassium: 3.2 mmol/L — ABNORMAL LOW (ref 3.5–5.1)
Sodium: 142 mmol/L (ref 135–145)
Total Bilirubin: 1.6 mg/dL — ABNORMAL HIGH (ref 0.3–1.2)
Total Protein: 5.9 g/dL — ABNORMAL LOW (ref 6.5–8.1)

## 2020-08-16 LAB — GLUCOSE, CAPILLARY
Glucose-Capillary: 118 mg/dL — ABNORMAL HIGH (ref 70–99)
Glucose-Capillary: 130 mg/dL — ABNORMAL HIGH (ref 70–99)
Glucose-Capillary: 155 mg/dL — ABNORMAL HIGH (ref 70–99)
Glucose-Capillary: 166 mg/dL — ABNORMAL HIGH (ref 70–99)
Glucose-Capillary: 179 mg/dL — ABNORMAL HIGH (ref 70–99)
Glucose-Capillary: 181 mg/dL — ABNORMAL HIGH (ref 70–99)

## 2020-08-16 LAB — MAGNESIUM: Magnesium: 2.3 mg/dL (ref 1.7–2.4)

## 2020-08-16 LAB — FIBRIN DERIVATIVES D-DIMER (ARMC ONLY): Fibrin derivatives D-dimer (ARMC): >7500 ng/mL (FEU) — ABNORMAL HIGH (ref 0.00–499.00)

## 2020-08-16 LAB — FERRITIN: Ferritin: 38 ng/mL (ref 11–307)

## 2020-08-16 LAB — HEMOGLOBIN AND HEMATOCRIT, BLOOD
HCT: 22 % — ABNORMAL LOW (ref 36.0–46.0)
Hemoglobin: 7 g/dL — ABNORMAL LOW (ref 12.0–15.0)

## 2020-08-16 LAB — C-REACTIVE PROTEIN: CRP: 13.9 mg/dL — ABNORMAL HIGH (ref <1.0)

## 2020-08-16 LAB — PHOSPHORUS: Phosphorus: 2.2 mg/dL — ABNORMAL LOW (ref 2.5–4.6)

## 2020-08-16 LAB — PREPARE RBC (CROSSMATCH)

## 2020-08-16 MED ORDER — POTASSIUM & SODIUM PHOSPHATES 280-160-250 MG PO PACK
2.0000 | PACK | Freq: Three times a day (TID) | ORAL | Status: AC
  Administered 2020-08-16 (×3): 2
  Filled 2020-08-16 (×3): qty 2

## 2020-08-16 MED ORDER — VECURONIUM BROMIDE 10 MG IV SOLR
10.0000 mg | INTRAVENOUS | Status: DC | PRN
  Administered 2020-08-17 (×3): 10 mg via INTRAVENOUS
  Filled 2020-08-16 (×3): qty 10

## 2020-08-16 MED ORDER — SODIUM CHLORIDE 0.9% IV SOLUTION: Freq: Once | INTRAVENOUS | Status: DC

## 2020-08-16 MED ORDER — VECURONIUM BROMIDE 10 MG IV SOLR
INTRAVENOUS | Status: AC
  Administered 2020-08-16: 10 mg via INTRAVENOUS
  Filled 2020-08-16: qty 10

## 2020-08-16 NOTE — Progress Notes (Signed)
Multiple attempts made to get husband connected for video chat were unsuccessful. Link sent to daughter and she was able to connect.

## 2020-08-16 NOTE — Progress Notes (Signed)
CRITICAL CARE NOTE  9/16 COVID 19 pnuemonia 9/17 PRONED, ARDS 9/18 severe hypoxia, ARDS 9/19 ARDS, severe hypoxia  CC  follow up respiratory failure  SUBJECTIVE Patient remains critically ill Prognosis is guarded  Vent Mode: PRVC FiO2 (%):  [100 %] 100 % Set Rate:  [24 bmp] 24 bmp Vt Set:  [460 mL] 460 mL PEEP:  [12 cmH20] 12 cmH20 Plateau Pressure:  [26 cmH20-36 cmH20] 26 cmH20   BP (!) 104/59   Pulse 74   Temp 98.7 F (37.1 C) (Axillary)   Resp (!) 24   Ht 5' 9.13" (1.756 m)   Wt 119.2 kg   LMP  (LMP Unknown)   SpO2 100%   BMI 38.66 kg/m    I/O last 3 completed shifts: In: 5165.6 [I.V.:2439.3; NG/GT:1601.2; IV Piggyback:1125.1] Out: 2450 [Urine:2450] No intake/output data recorded.  SpO2: 100 % O2 Flow Rate (L/min): 50 L/min FiO2 (%): 100 %  Estimated body mass index is 38.66 kg/m as calculated from the following:   Height as of this encounter: 5' 9.13" (1.756 m).   Weight as of this encounter: 119.2 kg.  SIGNIFICANT EVENTS   REVIEW OF SYSTEMS  PATIENT IS UNABLE TO PROVIDE COMPLETE REVIEW OF SYSTEMS DUE TO SEVERE CRITICAL ILLNESS      COVID-19 DISASTER DECLARATION:   FULL CONTACT PHYSICAL EXAMINATION WAS NOT POSSIBLE DUE TO TREATMENT OF COVID-19  AND CONSERVATION OF PERSONAL PROTECTIVE EQUIPMENT, LIMITED EXAM FINDINGS INCLUDE-   PHYSICAL EXAMINATION:  GENERAL:critically ill appearing, +resp distress NEUROLOGIC: obtunded, GCS<8   Patient assessed or the symptoms described in the history of present illness.  In the context of the Global COVID-19 pandemic, which necessitated consideration that the patient might be at risk for infection with the SARS-CoV-2 virus that causes COVID-19, Institutional protocols and algorithms that pertain to the evaluation of patients at risk for COVID-19 are in a state of rapid change based on information released by regulatory bodies including the CDC and federal and state organizations. These policies and algorithms  were followed during the patient's care while in hospital.    MEDICATIONS: I have reviewed all medications and confirmed regimen as documented   CULTURE RESULTS   Recent Results (from the past 240 hour(s))  Culture, blood (routine x 2)     Status: None (Preliminary result)   Collection Time: 08/13/20  6:06 PM   Specimen: BLOOD  Result Value Ref Range Status   Specimen Description BLOOD RIGHT ANTECUBITAL  Final   Special Requests   Final    BOTTLES DRAWN AEROBIC AND ANAEROBIC Blood Culture adequate volume   Culture   Final    NO GROWTH 3 DAYS Performed at [facility], [address], [place], [place] [address]    Report Status PENDING  Incomplete  Culture, blood (routine x 2)     Status: None (Preliminary result)   Collection Time: 08/13/20  6:06 PM   Specimen: BLOOD  Result Value Ref Range Status   Specimen Description BLOOD LEFT ANTECUBITAL  Final   Special Requests   Final    BOTTLES DRAWN AEROBIC AND ANAEROBIC Blood Culture adequate volume   Culture   Final    NO GROWTH 3 DAYS Performed at [facility], [address], [place], [place] [address]    Report Status PENDING  Incomplete  MRSA PCR Screening     Status: None   Collection Time: 08/13/20 11:34 PM   Specimen: Nasal Mucosa; Nasopharyngeal  Result Value Ref Range Status   MRSA by PCR  NEGATIVE NEGATIVE Final    Comment:        The GeneXpert MRSA Assay (FDA approved for NASAL specimens only), is one component of a comprehensive MRSA colonization surveillance program. It is not intended to diagnose MRSA infection nor to guide or monitor treatment for MRSA infections. Performed at [facility], [address], [place], [place] [address]           IMAGING    No results found.   Nutrition Status: Nutrition Problem: Inadequate oral intake Etiology: inability to eat Signs/Symptoms: NPO status Interventions: Tube feeding  BMP Latest Ref Rng & Units 08/15/2020 08/14/2020  08/13/2020  Glucose 70 - 99 mg/dL 210(H) 154(H) 139(H)  BUN 6 - 20 mg/dL 22(H) 15 18  Creatinine 0.44 - 1.00 mg/dL 1.22(H) 1.45(H) 1.77(H)  Sodium 135 - 145 mmol/L 144 140 137  Potassium 3.5 - 5.1 mmol/L 3.6 3.7 3.4(L)  Chloride 98 - 111 mmol/L 104 99 98  CO2 22 - 32 mmol/L 28 30 27   Calcium 8.9 - 10.3 mg/dL 8.1(L) 8.5(L) 8.7(L)      Indwelling Urinary Catheter continued, requirement due to   Reason to continue Indwelling Urinary Catheter strict Intake/Output monitoring for hemodynamic instability   Central Line/ continued, requirement due to  Reason to continue [place] of central venous pressure or other hemodynamic parameters and poor IV access   Ventilator continued, requirement due to severe respiratory failure   Ventilator Sedation RASS 0 to -2      ASSESSMENT AND PLAN SYNOPSIS  Acute hypoxemic respiratory failure due to COVID-19 pneumonia / ARDS Mechanical ventilation via ARDS protocol, target PRVC 6 cc/kg Wean PEEP and FiO2 as able Goal plateau pressure less than 30, driving pressure less than 15 Paralytics if necessary for vent synchrony, gas exchange Cycle prone positioning if necessary for oxygenation Deep sedation per PAD protocol, goal RASS -4, currently fentanyl, midazolam Diuresis as blood pressure and renal function can tolerate, goal CVP 5-8.   diuresis as tolerated based on Kidney function VAP prevention order set Remdesivir + BARIC IV STEROIDS  Follow inflammatory markers: Ferritin, D-dimer, CRP, IL-6, LDH Vitamin C, zinc Plan to repeat and check resp cultures    Severe ACUTE Hypoxic and Hypercapnic Respiratory Failure -continue Full MV support -continue Bronchodilator Therapy -Wean Fio2 and PEEP as tolerated -VAP/VENT bundle implementation Plan for PRONING  Morbid obesity, possible OSA.   Will certainly impact respiratory mechanics, ventilator weaning Suspect will need to consider additional PEEP  ACUTE KIDNEY INJURY/Renal  Failure -continue Foley Catheter-assess need -Avoid nephrotoxic agents -Follow urine output, BMP -Ensure adequate renal perfusion, optimize oxygenation -Renal dose medications     NEUROLOGY Acute toxic metabolic encephalopathy, need for sedation Goal RASS -2 to -3  SHOCK-SEPSIS -use vasopressors to keep MAP>65 if needed  CARDIAC ICU monitoring  ID -continue IV abx as prescibed -follow up cultures  GI GI PROPHYLAXIS as indicated   DIET-->TF's as tolerated Constipation protocol as indicated  ENDO - will use ICU hypoglycemic\Hyperglycemia protocol if indicated     ELECTROLYTES -follow labs as needed -replace as needed -pharmacy consultation and following   DVT/GI PRX ordered and assessed TRANSFUSIONS AS NEEDED MONITOR FSBS I Assessed the need for Labs I Assessed the need for Foley I Assessed the need for Central Venous Line Family Discussion when available I Assessed the need for Mobilization I made an Assessment of medications to be adjusted accordingly Safety Risk assessment completed   CASE DISCUSSED IN [place]  Time devoted to patient care services described in this note is 35 minutes.   Overall, patient is critically ill, prognosis is guarded.  Patient with Multiorgan failure and at high risk for cardiac arrest and death.    Corrin Parker, M.D.  [facility]  Medical Director [place] Director [facility] Cardio-Pulmonary Department

## 2020-08-16 NOTE — Progress Notes (Signed)
Attempted tele visit to patient with family member twice and unable to connect.  Kathy Vega, Janace Hoard, RN

## 2020-08-16 NOTE — Progress Notes (Signed)
Connected video chat with daughter

## 2020-08-16 NOTE — Progress Notes (Signed)
Pharmacy Electrolyte Monitoring Consult:  Pharmacy consulted to assist in monitoring and replacing electrolytes in this 44 y.o. female admitted on 08/13/2020 with Shortness of Breath   Labs:  Sodium (mmol/L)  Date Value  08/16/2020 142  03/03/2014 140   Potassium (mmol/L)  Date Value  08/16/2020 3.2 (L)  03/03/2014 3.9   Magnesium (mg/dL)  Date Value  08/16/2020 2.3   Phosphorus (mg/dL)  Date Value  08/16/2020 2.2 (L)   Calcium (mg/dL)  Date Value  08/16/2020 7.0 (L)   Calcium, Total (mg/dL)  Date Value  03/03/2014 9.3   Albumin (g/dL)  Date Value  08/16/2020 2.8 (L)  03/03/2014 4.2    Assessment: 44 yo female here with COVID PNA  Goal: Electrolyte WNL  Plan: K 3.2, Phos 2.2 - will order NaKPhos 2 packets x3 doses (~42 mEq of K) F/u labs in AM   Rayna Sexton L 08/16/2020 10:27 AM

## 2020-08-17 ENCOUNTER — Inpatient Hospital Stay: Payer: Medicaid Other

## 2020-08-17 DIAGNOSIS — N170 Acute kidney failure with tubular necrosis: Secondary | ICD-10-CM

## 2020-08-17 LAB — CBC WITH DIFFERENTIAL/PLATELET
Abs Immature Granulocytes: 0.8 10*3/uL — ABNORMAL HIGH (ref 0.00–0.07)
Basophils Absolute: 0 10*3/uL (ref 0.0–0.1)
Basophils Relative: 0 %
Eosinophils Absolute: 0 10*3/uL (ref 0.0–0.5)
Eosinophils Relative: 0 %
HCT: 25 % — ABNORMAL LOW (ref 36.0–46.0)
Hemoglobin: 7.6 g/dL — ABNORMAL LOW (ref 12.0–15.0)
Immature Granulocytes: 6 %
Lymphocytes Relative: 6 %
Lymphs Abs: 0.8 10*3/uL (ref 0.7–4.0)
MCH: 21.3 pg — ABNORMAL LOW (ref 26.0–34.0)
MCHC: 30.4 g/dL (ref 30.0–36.0)
MCV: 70.2 fL — ABNORMAL LOW (ref 80.0–100.0)
Monocytes Absolute: 0.2 10*3/uL (ref 0.1–1.0)
Monocytes Relative: 1 %
Neutro Abs: 11.9 10*3/uL — ABNORMAL HIGH (ref 1.7–7.7)
Neutrophils Relative %: 87 %
Platelets: 237 10*3/uL (ref 150–400)
RBC: 3.56 MIL/uL — ABNORMAL LOW (ref 3.87–5.11)
RDW: 22.2 % — ABNORMAL HIGH (ref 11.5–15.5)
WBC Morphology: INCREASED
WBC: 13.7 10*3/uL — ABNORMAL HIGH (ref 4.0–10.5)
nRBC: 2.1 % — ABNORMAL HIGH (ref 0.0–0.2)

## 2020-08-17 LAB — COMPREHENSIVE METABOLIC PANEL
ALT: 67 U/L — ABNORMAL HIGH (ref 0–44)
AST: 93 U/L — ABNORMAL HIGH (ref 15–41)
Albumin: 2.9 g/dL — ABNORMAL LOW (ref 3.5–5.0)
Alkaline Phosphatase: 85 U/L (ref 38–126)
Anion gap: 11 (ref 5–15)
BUN: 19 mg/dL (ref 6–20)
CO2: 29 mmol/L (ref 22–32)
Calcium: 7.7 mg/dL — ABNORMAL LOW (ref 8.9–10.3)
Chloride: 108 mmol/L (ref 98–111)
Creatinine, Ser: 0.96 mg/dL (ref 0.44–1.00)
GFR calc Af Amer: >60 mL/min (ref >60)
GFR calc non Af Amer: >60 mL/min (ref >60)
Glucose, Bld: 151 mg/dL — ABNORMAL HIGH (ref 70–99)
Potassium: 3.4 mmol/L — ABNORMAL LOW (ref 3.5–5.1)
Sodium: 148 mmol/L — ABNORMAL HIGH (ref 135–145)
Total Bilirubin: 0.5 mg/dL (ref 0.3–1.2)
Total Protein: 6.3 g/dL — ABNORMAL LOW (ref 6.5–8.1)

## 2020-08-17 LAB — GLUCOSE, CAPILLARY
Glucose-Capillary: 120 mg/dL — ABNORMAL HIGH (ref 70–99)
Glucose-Capillary: 122 mg/dL — ABNORMAL HIGH (ref 70–99)
Glucose-Capillary: 131 mg/dL — ABNORMAL HIGH (ref 70–99)
Glucose-Capillary: 139 mg/dL — ABNORMAL HIGH (ref 70–99)
Glucose-Capillary: 145 mg/dL — ABNORMAL HIGH (ref 70–99)
Glucose-Capillary: 159 mg/dL — ABNORMAL HIGH (ref 70–99)
Glucose-Capillary: 174 mg/dL — ABNORMAL HIGH (ref 70–99)

## 2020-08-17 LAB — TRIGLYCERIDES: Triglycerides: 1906 mg/dL — ABNORMAL HIGH (ref <150)

## 2020-08-17 LAB — PHOSPHORUS: Phosphorus: 3.4 mg/dL (ref 2.5–4.6)

## 2020-08-17 LAB — FIBRIN DERIVATIVES D-DIMER (ARMC ONLY): Fibrin derivatives D-dimer (ARMC): >7500 ng/mL (FEU) — ABNORMAL HIGH (ref 0.00–499.00)

## 2020-08-17 LAB — MAGNESIUM: Magnesium: 2.2 mg/dL (ref 1.7–2.4)

## 2020-08-17 LAB — FERRITIN: Ferritin: 30 ng/mL (ref 11–307)

## 2020-08-17 MED ORDER — VECURONIUM BROMIDE 10 MG IV SOLR
10.0000 mg | INTRAVENOUS | Status: DC | PRN
  Administered 2020-08-17 – 2020-08-18 (×2): 10 mg via INTRAVENOUS
  Administered 2020-08-18: 20 mg via INTRAVENOUS
  Administered 2020-08-18 (×2): 10 mg via INTRAVENOUS
  Administered 2020-08-19 (×3): 20 mg via INTRAVENOUS
  Administered 2020-08-19: 10 mg via INTRAVENOUS
  Administered 2020-08-20: 20 mg via INTRAVENOUS
  Administered 2020-08-20 – 2020-08-21 (×5): 10 mg via INTRAVENOUS
  Administered 2020-08-22: 20 mg via INTRAVENOUS
  Administered 2020-08-22 (×2): 10 mg via INTRAVENOUS
  Administered 2020-08-25: 20 mg via INTRAVENOUS
  Administered 2020-08-25 – 2020-08-27 (×4): 10 mg via INTRAVENOUS
  Administered 2020-08-27: 20 mg via INTRAVENOUS
  Administered 2020-08-28: 10 mg via INTRAVENOUS
  Filled 2020-08-17 (×4): qty 10
  Filled 2020-08-17: qty 20
  Filled 2020-08-17: qty 10
  Filled 2020-08-17: qty 20
  Filled 2020-08-17 (×4): qty 10
  Filled 2020-08-17 (×2): qty 20
  Filled 2020-08-17 (×7): qty 10
  Filled 2020-08-17: qty 20
  Filled 2020-08-17: qty 10
  Filled 2020-08-17: qty 20
  Filled 2020-08-17 (×2): qty 10
  Filled 2020-08-17: qty 20
  Filled 2020-08-17: qty 10

## 2020-08-17 MED ORDER — POTASSIUM CHLORIDE 10 MEQ/50ML IV SOLN
10.0000 meq | INTRAVENOUS | Status: AC
  Administered 2020-08-17 (×3): 10 meq via INTRAVENOUS
  Filled 2020-08-17 (×4): qty 50

## 2020-08-17 MED ORDER — MIDAZOLAM HCL 2 MG/2ML IJ SOLN
INTRAMUSCULAR | Status: AC
  Administered 2020-08-17: 4 mg via INTRAVENOUS
  Filled 2020-08-17: qty 4

## 2020-08-17 MED ORDER — MIDAZOLAM 50MG/50ML (1MG/ML) PREMIX INFUSION
0.5000 mg/h | INTRAVENOUS | Status: DC
  Administered 2020-08-17: 8 mg/h via INTRAVENOUS
  Administered 2020-08-17: 10 mg/h via INTRAVENOUS
  Administered 2020-08-17: 2 mg/h via INTRAVENOUS
  Administered 2020-08-18 (×4): 10 mg/h via INTRAVENOUS
  Filled 2020-08-17 (×7): qty 50

## 2020-08-17 MED ORDER — MIDAZOLAM HCL 2 MG/2ML IJ SOLN: 4.0000 mg | Freq: Once | INTRAMUSCULAR | Status: AC

## 2020-08-17 NOTE — Progress Notes (Signed)
CRITICAL CARE NOTE 9/16 COVID 19 pnuemonia 9/17 PRONED, ARDS 9/18 severe hypoxia, ARDS 9/19 ARDS, severe hypoxia, proned last night   CC  follow up respiratory failure  SUBJECTIVE Patient remains critically ill Prognosis is guarded Severe ARDS  Vent Mode: PRVC FiO2 (%):  [100 %] 100 % Set Rate:  [24 bmp] 24 bmp Vt Set:  [460 mL] 460 mL PEEP:  [12 cmH20] 12 cmH20 Plateau Pressure:  [32 cmH20] 32 cmH20    BP 112/76   Pulse (!) 109   Temp 98 F (36.7 C) (Axillary)   Resp (!) 24   Ht 5' 9.13" (1.756 m)   Wt 120.1 kg   LMP  (LMP Unknown)   SpO2 94%   BMI 38.95 kg/m    I/O last 3 completed shifts: In: 5597.5 [I.V.:2647; Blood:492; NG/GT:1758.3; IV Piggyback:700.1] Out: 2160 [Urine:2160] No intake/output data recorded.  SpO2: 94 % O2 Flow Rate (L/min): 50 L/min FiO2 (%): 100 %  Estimated body mass index is 38.95 kg/m as calculated from the following:   Height as of this encounter: 5' 9.13" (1.756 m).   Weight as of this encounter: 120.1 kg.  SIGNIFICANT EVENTS   REVIEW OF SYSTEMS  PATIENT IS UNABLE TO PROVIDE COMPLETE REVIEW OF SYSTEMS DUE TO SEVERE CRITICAL ILLNESS      COVID-19 DISASTER DECLARATION:   FULL CONTACT PHYSICAL EXAMINATION WAS NOT POSSIBLE DUE TO TREATMENT OF COVID-19  AND CONSERVATION OF PERSONAL PROTECTIVE EQUIPMENT, LIMITED EXAM FINDINGS INCLUDE-   PHYSICAL EXAMINATION:  GENERAL:critically ill appearing, +resp distress NEUROLOGIC: obtunded, GCS<8   Patient assessed or the symptoms described in the history of present illness.  In the context of the Global COVID-19 pandemic, which necessitated consideration that the patient might be at risk for infection with the SARS-CoV-2 virus that causes COVID-19, Institutional protocols and algorithms that pertain to the evaluation of patients at risk for COVID-19 are in a state of rapid change based on information released by regulatory bodies including the CDC and federal and state organizations.  These policies and algorithms were followed during the patient's care while in hospital.    MEDICATIONS: I have reviewed all medications and confirmed regimen as documented   CULTURE RESULTS   Recent Results (from the past 240 hour(s))  Culture, blood (routine x 2)     Status: None (Preliminary result)   Collection Time: 08/13/20  6:06 PM   Specimen: BLOOD  Result Value Ref Range Status   Specimen Description BLOOD RIGHT ANTECUBITAL  Final   Special Requests   Final    BOTTLES DRAWN AEROBIC AND ANAEROBIC Blood Culture adequate volume   Culture   Final    NO GROWTH 3 DAYS Performed at [facility], [address], [place], [place] [address]    Report Status PENDING  Incomplete  Culture, blood (routine x 2)     Status: None (Preliminary result)   Collection Time: 08/13/20  6:06 PM   Specimen: BLOOD  Result Value Ref Range Status   Specimen Description BLOOD LEFT ANTECUBITAL  Final   Special Requests   Final    BOTTLES DRAWN AEROBIC AND ANAEROBIC Blood Culture adequate volume   Culture   Final    NO GROWTH 3 DAYS Performed at [facility], [address], [place], [place] [address]    Report Status PENDING  Incomplete  MRSA PCR Screening     Status: None   Collection Time: 08/13/20 11:34 PM   Specimen: Nasal Mucosa; Nasopharyngeal  Result Value Ref Range  Status   MRSA by PCR NEGATIVE NEGATIVE Final    Comment:        The GeneXpert MRSA Assay (FDA approved for NASAL specimens only), is one component of a comprehensive MRSA colonization surveillance program. It is not intended to diagnose MRSA infection nor to guide or monitor treatment for MRSA infections. Performed at [facility], [place], [place] [address]         BMP Latest Ref Rng & Units 08/17/2020 08/16/2020 08/15/2020  Glucose 70 - 99 mg/dL 151(H) 180(H) 210(H)  BUN 6 - 20 mg/dL 19 21(H) 22(H)  Creatinine 0.44 - 1.00 mg/dL 0.96 0.95 1.22(H)  Sodium 135 - 145  mmol/L 148(H) 142 144  Potassium 3.5 - 5.1 mmol/L 3.4(L) 3.2(L) 3.6  Chloride 98 - 111 mmol/L 108 104 104  CO2 22 - 32 mmol/L 29 22 28   Calcium 8.9 - 10.3 mg/dL 7.7(L) 7.0(L) 8.1(L)       Indwelling Urinary Catheter continued, requirement due to   Reason to continue Indwelling Urinary Catheter strict Intake/Output monitoring for hemodynamic instability   Central Line/ continued, requirement due to  Reason to continue [place] of central venous pressure or other hemodynamic parameters and poor IV access   Ventilator continued, requirement due to severe respiratory failure   Ventilator Sedation RASS 0 to -2      ASSESSMENT AND PLAN SYNOPSIS  Acute hypoxemic respiratory failure due to COVID-19 pneumonia / ARDS Mechanical ventilation via ARDS protocol, target PRVC 6 cc/kg Wean PEEP and FiO2 as able Goal plateau pressure less than 30, driving pressure less than 15 Paralytics if necessary for vent synchrony, gas exchange Cycle prone positioning if necessary for oxygenation Deep sedation per PAD protocol, goal RASS -4, currently fentanyl, midazolam Diuresis as blood pressure and renal function can tolerate, goal CVP 5-8.   diuresis as tolerated based on Kidney function VAP prevention order set Remdesivir + BARIC IV STEROIDS   Severe ACUTE Hypoxic and Hypercapnic Respiratory Failure -continue Full MV support -continue Bronchodilator Therapy -Wean Fio2 and PEEP as tolerated -VAP/VENT bundle implementation PRONING as tolerated  Morbid obesity, possible OSA.   Will certainly impact respiratory mechanics, ventilator weaning Suspect will need to consider additional PEEP  ACUTE KIDNEY INJURY/Renal Failure -continue Foley Catheter-assess need -Avoid nephrotoxic agents -Follow urine output, BMP -Ensure adequate renal perfusion, optimize oxygenation -Renal dose medications     NEUROLOGY Acute toxic metabolic encephalopathy, need for sedation Goal RASS -2 to  -3   CARDIAC ICU monitoring  ID -continue IV abx as prescibed -follow up cultures  GI GI PROPHYLAXIS as indicated   DIET-->TF's as tolerated Constipation protocol as indicated  ENDO - will use ICU hypoglycemic\Hyperglycemia protocol if indicated    ELECTROLYTES -follow labs as needed -replace as needed -pharmacy consultation and following   DVT/GI PRX ordered and assessed TRANSFUSIONS AS NEEDED MONITOR FSBS I Assessed the need for Labs I Assessed the need for Foley I Assessed the need for Central Venous Line Family Discussion when available I Assessed the need for Mobilization I made an Assessment of medications to be adjusted accordingly Safety Risk assessment completed   CASE DISCUSSED IN MULTIDISCIPLINARY ROUNDS WITH ICU TEAM  Critical Care Time devoted to patient care services described in this note is 35 minutes.   Overall, patient is critically ill, prognosis is guarded.  Patient with Multiorgan failure and at high risk for cardiac arrest and death.     Corrin Parker, M.D.  [facility]  Medical Director [place] Director [facility] Cardio-Pulmonary Department

## 2020-08-17 NOTE — Progress Notes (Signed)
Assisted tele visit to patient with husband and sister-in-law.  Elizabet Schweppe, Janace Hoard, RN

## 2020-08-17 NOTE — Progress Notes (Signed)
Pharmacy Electrolyte Monitoring Consult:  Pharmacy consulted to assist in monitoring and replacing electrolytes in this 44 y.o. female admitted on 08/13/2020 with Shortness of Breath   Labs:  Sodium (mmol/L)  Date Value  08/17/2020 148 (H)  03/03/2014 140   Potassium (mmol/L)  Date Value  08/17/2020 3.4 (L)  03/03/2014 3.9   Magnesium (mg/dL)  Date Value  08/17/2020 2.2   Phosphorus (mg/dL)  Date Value  08/17/2020 3.4   Calcium (mg/dL)  Date Value  08/17/2020 7.7 (L)   Calcium, Total (mg/dL)  Date Value  03/03/2014 9.3   Albumin (g/dL)  Date Value  08/17/2020 2.9 (L)  03/03/2014 4.2    Assessment: 44 yo female here with COVID PNA  Goal: Electrolyte WNL  Plan: Potassium 10 mEq IV x 4. Sodium slightly elevated, will continue to monitor and adjust free water flushes if necessary.   Tawnya Crook, PharmD 08/17/2020 2:08 PM

## 2020-08-18 ENCOUNTER — Inpatient Hospital Stay: Payer: Medicaid Other

## 2020-08-18 DIAGNOSIS — J9601 Acute respiratory failure with hypoxia: Secondary | ICD-10-CM

## 2020-08-18 LAB — CBC WITH DIFFERENTIAL/PLATELET
Abs Immature Granulocytes: 0.79 10*3/uL — ABNORMAL HIGH (ref 0.00–0.07)
Basophils Absolute: 0 10*3/uL (ref 0.0–0.1)
Basophils Relative: 0 %
Eosinophils Absolute: 0 10*3/uL (ref 0.0–0.5)
Eosinophils Relative: 0 %
HCT: 25 % — ABNORMAL LOW (ref 36.0–46.0)
Hemoglobin: 7.7 g/dL — ABNORMAL LOW (ref 12.0–15.0)
Immature Granulocytes: 6 %
Lymphocytes Relative: 5 %
Lymphs Abs: 0.7 10*3/uL (ref 0.7–4.0)
MCH: 21.5 pg — ABNORMAL LOW (ref 26.0–34.0)
MCHC: 30.8 g/dL (ref 30.0–36.0)
MCV: 69.8 fL — ABNORMAL LOW (ref 80.0–100.0)
Monocytes Absolute: 0.2 10*3/uL (ref 0.1–1.0)
Monocytes Relative: 1 %
Neutro Abs: 11.8 10*3/uL — ABNORMAL HIGH (ref 1.7–7.7)
Neutrophils Relative %: 88 %
Platelets: 227 10*3/uL (ref 150–400)
RBC: 3.58 MIL/uL — ABNORMAL LOW (ref 3.87–5.11)
RDW: 22.9 % — ABNORMAL HIGH (ref 11.5–15.5)
Smear Review: NORMAL
WBC: 13.6 10*3/uL — ABNORMAL HIGH (ref 4.0–10.5)
nRBC: 0.8 % — ABNORMAL HIGH (ref 0.0–0.2)

## 2020-08-18 LAB — COMPREHENSIVE METABOLIC PANEL
ALT: 63 U/L — ABNORMAL HIGH (ref 0–44)
AST: 68 U/L — ABNORMAL HIGH (ref 15–41)
Albumin: 2.8 g/dL — ABNORMAL LOW (ref 3.5–5.0)
Alkaline Phosphatase: 87 U/L (ref 38–126)
Anion gap: 9 (ref 5–15)
BUN: 17 mg/dL (ref 6–20)
CO2: 30 mmol/L (ref 22–32)
Calcium: 7.6 mg/dL — ABNORMAL LOW (ref 8.9–10.3)
Chloride: 109 mmol/L (ref 98–111)
Creatinine, Ser: 0.89 mg/dL (ref 0.44–1.00)
GFR calc Af Amer: >60 mL/min (ref >60)
GFR calc non Af Amer: >60 mL/min (ref >60)
Glucose, Bld: 117 mg/dL — ABNORMAL HIGH (ref 70–99)
Potassium: 3.3 mmol/L — ABNORMAL LOW (ref 3.5–5.1)
Sodium: 148 mmol/L — ABNORMAL HIGH (ref 135–145)
Total Bilirubin: 0.6 mg/dL (ref 0.3–1.2)
Total Protein: 6.3 g/dL — ABNORMAL LOW (ref 6.5–8.1)

## 2020-08-18 LAB — BLOOD GAS, ARTERIAL
Acid-Base Excess: 5.8 mmol/L — ABNORMAL HIGH (ref 0.0–2.0)
Bicarbonate: 31.1 mmol/L — ABNORMAL HIGH (ref 20.0–28.0)
FIO2: 100
MECHVT: 460 mL
O2 Saturation: 92.3 %
PEEP: 12 cmH2O
Patient temperature: 37
RATE: 24 resp/min
pCO2 arterial: 49 mmHg — ABNORMAL HIGH (ref 32.0–48.0)
pH, Arterial: 7.41 (ref 7.350–7.450)
pO2, Arterial: 64 mmHg — ABNORMAL LOW (ref 83.0–108.0)

## 2020-08-18 LAB — CULTURE, BLOOD (ROUTINE X 2)
Culture: NO GROWTH
Culture: NO GROWTH
Special Requests: ADEQUATE
Special Requests: ADEQUATE

## 2020-08-18 LAB — MAGNESIUM: Magnesium: 2.1 mg/dL (ref 1.7–2.4)

## 2020-08-18 LAB — FIBRIN DERIVATIVES D-DIMER (ARMC ONLY): Fibrin derivatives D-dimer (ARMC): >7500 ng/mL (FEU) — ABNORMAL HIGH (ref 0.00–499.00)

## 2020-08-18 LAB — C-REACTIVE PROTEIN
CRP: 13.8 mg/dL — ABNORMAL HIGH (ref <1.0)
CRP: 22.3 mg/dL — ABNORMAL HIGH (ref <1.0)

## 2020-08-18 LAB — FERRITIN: Ferritin: 40 ng/mL (ref 11–307)

## 2020-08-18 LAB — GLUCOSE, CAPILLARY
Glucose-Capillary: 100 mg/dL — ABNORMAL HIGH (ref 70–99)
Glucose-Capillary: 118 mg/dL — ABNORMAL HIGH (ref 70–99)
Glucose-Capillary: 133 mg/dL — ABNORMAL HIGH (ref 70–99)
Glucose-Capillary: 140 mg/dL — ABNORMAL HIGH (ref 70–99)
Glucose-Capillary: 158 mg/dL — ABNORMAL HIGH (ref 70–99)

## 2020-08-18 LAB — PHOSPHORUS: Phosphorus: 2.5 mg/dL (ref 2.5–4.6)

## 2020-08-18 MED ORDER — MIDAZOLAM HCL 50 MG/10ML IJ SOLN
0.5000 mg/h | INTRAVENOUS | Status: DC
  Administered 2020-08-18 – 2020-08-23 (×21): 10 mg/h via INTRAVENOUS
  Administered 2020-08-23: 8 mg/h via INTRAVENOUS
  Administered 2020-08-23: 7 mg/h via INTRAVENOUS
  Administered 2020-08-23: 10 mg/h via INTRAVENOUS
  Administered 2020-08-24 – 2020-08-25 (×5): 8 mg/h via INTRAVENOUS
  Administered 2020-08-25 – 2020-08-29 (×16): 10 mg/h via INTRAVENOUS
  Administered 2020-08-29: 5 mg/h via INTRAVENOUS
  Administered 2020-08-29: 10 mg/h via INTRAVENOUS
  Administered 2020-08-30: 5 mg/h via INTRAVENOUS
  Filled 2020-08-18 (×52): qty 10

## 2020-08-18 MED ORDER — MIDAZOLAM HCL 50 MG/10ML IJ SOLN
0.5000 mg/h | INTRAVENOUS | Status: DC
  Filled 2020-08-18: qty 10

## 2020-08-18 MED ORDER — POTASSIUM CHLORIDE 20 MEQ PO PACK
40.0000 meq | PACK | Freq: Once | ORAL | Status: AC
  Administered 2020-08-18: 40 meq
  Filled 2020-08-18: qty 2

## 2020-08-18 NOTE — Progress Notes (Addendum)
I have called [facility] for possible assessment and transfer for ECMO, both [facility] and [facility] state  That they have no bed available and are at capacity and she may or may NOT be a candidate at this time.

## 2020-08-18 NOTE — Progress Notes (Signed)
CRITICAL CARE NOTE CRITICAL CARE NOTE 9/16 COVID 19 pnuemonia 9/17 PRONED, ARDS 9/18 severe hypoxia, ARDS 9/19 ARDS, severe hypoxia, proned last night 9/20 severe ARDS, pneumonia, resp failure   CC  follow up respiratory failure  SUBJECTIVE Patient remains critically ill Prognosis is guarded Severe Hypoxia  BP 99/70   Pulse (!) 112   Temp 98 F (36.7 C) (Oral)   Resp 15   Ht 5' 9.13" (1.756 m)   Wt 120.7 kg   LMP  (LMP Unknown)   SpO2 90%   BMI 39.14 kg/m    I/O last 3 completed shifts: In: 4400.7 [I.V.:3419.8; NG/GT:282; IV Piggyback:698.9] Out: 2020 [Urine:2020] No intake/output data recorded.  SpO2: 90 % O2 Flow Rate (L/min): 50 L/min FiO2 (%): 100 %  Estimated body mass index is 39.14 kg/m as calculated from the following:   Height as of this encounter: 5' 9.13" (1.756 m).   Weight as of this encounter: 120.7 kg.  SIGNIFICANT EVENTS   REVIEW OF SYSTEMS  PATIENT IS UNABLE TO PROVIDE COMPLETE REVIEW OF SYSTEMS DUE TO SEVERE CRITICAL ILLNESS      COVID-19 DISASTER DECLARATION:   FULL CONTACT PHYSICAL EXAMINATION WAS NOT POSSIBLE DUE TO TREATMENT OF COVID-19  AND CONSERVATION OF PERSONAL PROTECTIVE EQUIPMENT, LIMITED EXAM FINDINGS INCLUDE-   PHYSICAL EXAMINATION:  GENERAL:critically ill appearing, +resp distress NEUROLOGIC: obtunded, GCS<8   Patient assessed or the symptoms described in the history of present illness.  In the context of the Global COVID-19 pandemic, which necessitated consideration that the patient might be at risk for infection with the SARS-CoV-2 virus that causes COVID-19, Institutional protocols and algorithms that pertain to the evaluation of patients at risk for COVID-19 are in a state of rapid change based on information released by regulatory bodies including the CDC and federal and state organizations. These policies and algorithms were followed during the patient's care while in hospital.    MEDICATIONS: I have reviewed  all medications and confirmed regimen as documented   CULTURE RESULTS   Recent Results (from the past 240 hour(s))  Culture, blood (routine x 2)     Status: None (Preliminary result)   Collection Time: 08/13/20  6:06 PM   Specimen: BLOOD  Result Value Ref Range Status   Specimen Description BLOOD RIGHT ANTECUBITAL  Final   Special Requests   Final    BOTTLES DRAWN AEROBIC AND ANAEROBIC Blood Culture adequate volume   Culture   Final    NO GROWTH 4 DAYS Performed at [facility], [address] [place], [place] [address]    Report Status PENDING  Incomplete  Culture, blood (routine x 2)     Status: None (Preliminary result)   Collection Time: 08/13/20  6:06 PM   Specimen: BLOOD  Result Value Ref Range Status   Specimen Description BLOOD LEFT ANTECUBITAL  Final   Special Requests   Final    BOTTLES DRAWN AEROBIC AND ANAEROBIC Blood Culture adequate volume   Culture   Final    NO GROWTH 4 DAYS Performed at [facility], [address] [place], [place] [address]    Report Status PENDING  Incomplete  MRSA PCR Screening     Status: None   Collection Time: 08/13/20 11:34 PM   Specimen: Nasal Mucosa; Nasopharyngeal  Result Value Ref Range Status   MRSA by PCR NEGATIVE NEGATIVE Final    Comment:        The GeneXpert MRSA Assay (FDA approved for NASAL specimens only), is one component  of a comprehensive MRSA colonization surveillance program. It is not intended to diagnose MRSA infection nor to guide or monitor treatment for MRSA infections. Performed at [facility], [address] [place], [place] [address]           IMAGING    DG Chest Port 1 View  Result Date: 08/17/2020 CLINICAL DATA:  Shortness of breath. EXAM: PORTABLE CHEST 1 VIEW COMPARISON:  August 16, 2020. FINDINGS: Stable cardiomediastinal silhouette. Endotracheal and nasogastric tubes are unchanged in position. No pneumothorax or pleural effusion is noted. Bilateral lung  opacities are noted consistent with multifocal pneumonia. Bony thorax is unremarkable. IMPRESSION: Stable bilateral lung opacities consistent with multifocal pneumonia. Electronically Signed   By: Marijo Conception M.D.   On: 08/17/2020 15:53     Nutrition Status: Nutrition Problem: Inadequate oral intake Etiology: inability to eat Signs/Symptoms: NPO status Interventions: Tube feeding  CBC    Component Value Date/Time   WBC 13.6 (H) 08/18/2020 0509   RBC 3.58 (L) 08/18/2020 0509   HGB 7.7 (L) 08/18/2020 0509   HCT 25.0 (L) 08/18/2020 0509   HCT 22.0 (L) 03/15/2018 1622   PLT 227 08/18/2020 0509   MCV 69.8 (L) 08/18/2020 0509   MCH 21.5 (L) 08/18/2020 0509   MCHC 30.8 08/18/2020 0509   RDW 22.9 (H) 08/18/2020 0509   LYMPHSABS 0.7 08/18/2020 0509   MONOABS 0.2 08/18/2020 0509   EOSABS 0.0 08/18/2020 0509   BASOSABS 0.0 08/18/2020 0509   BMP Latest Ref Rng & Units 08/18/2020 08/17/2020 08/16/2020  Glucose 70 - 99 mg/dL 117(H) 151(H) 180(H)  BUN 6 - 20 mg/dL 17 19 21(H)  Creatinine 0.44 - 1.00 mg/dL 0.89 0.96 0.95  Sodium 135 - 145 mmol/L 148(H) 148(H) 142  Potassium 3.5 - 5.1 mmol/L 3.3(L) 3.4(L) 3.2(L)  Chloride 98 - 111 mmol/L 109 108 104  CO2 22 - 32 mmol/L 30 29 22   Calcium 8.9 - 10.3 mg/dL 7.6(L) 7.7(L) 7.0(L)      Indwelling Urinary Catheter continued, requirement due to   Reason to continue Indwelling Urinary Catheter strict Intake/Output monitoring for hemodynamic instability   Central Line/ continued, requirement due to  Reason to continue [place] of central venous pressure or other hemodynamic parameters and poor IV access   Ventilator continued, requirement due to severe respiratory failure   Ventilator Sedation RASS 0 to -2      ASSESSMENT AND PLAN SYNOPSIS  Acute hypoxemic respiratory failure due to COVID-19 pneumonia / ARDS Mechanical ventilation via ARDS protocol, target PRVC 6 cc/kg Wean PEEP and FiO2 as able Goal plateau pressure  less than 30, driving pressure less than 15 Paralytics if necessary for vent synchrony, gas exchange Cycle prone positioning if necessary for oxygenation Deep sedation per PAD protocol, goal RASS -4, currently fentanyl, midazolam Diuresis as blood pressure and renal function can tolerate, goal CVP 5-8.   diuresis as tolerated based on Kidney function VAP prevention order set Remdesivir + BARIC IV STEROIDS    Severe ACUTE Hypoxic and Hypercapnic Respiratory Failure -continue Full MV support -continue Bronchodilator Therapy -Wean Fio2 and PEEP as tolerated -VAP/VENT bundle implementation  ACUTE DIASTOLIC CARDIAC FAILURE-  -oxygen as needed -Lasix as tolerated  Morbid obesity, possible OSA.   Will certainly impact respiratory mechanics, ventilator weaning Suspect will need to consider additional PEEP   NEUROLOGY Acute toxic metabolic encephalopathy, need for sedation Goal RASS -2 to -3   CARDIAC ICU monitoring  ID -continue IV abx as prescibed -follow up cultures  GI GI PROPHYLAXIS as indicated   DIET-->TF's as tolerated Constipation protocol as indicated  ENDO - will use ICU hypoglycemic\Hyperglycemia protocol if indicated     ELECTROLYTES -follow labs as needed -replace as needed -pharmacy consultation and following   DVT/GI PRX ordered and assessed TRANSFUSIONS AS NEEDED MONITOR FSBS I Assessed the need for Labs I Assessed the need for Foley I Assessed the need for Central Venous Line Family Discussion when available I Assessed the need for Mobilization I made an Assessment of medications to be adjusted accordingly Safety Risk assessment completed   CASE DISCUSSED IN MULTIDISCIPLINARY ROUNDS WITH ICU TEAM  Critical Care Time devoted to patient care services described in this note is 45 minutes.   Overall, patient is critically ill, prognosis is guarded.  Patient with Multiorgan failure and at high risk for cardiac arrest and death.    Corrin Parker, M.D.  [facility]  Medical Director [place] Director [facility] Cardio-Pulmonary Department

## 2020-08-18 NOTE — Progress Notes (Signed)
Pt responds to pain on initial assessment. PRN paralytic for ventilator dyssynchrony. Pt de-sating into 80s when supine. Proned at approximately 1200. Unresponsive at this time. WUA deferred when prone. Echocardiogram pending, prioritizing prone positioning for oxygen needs. Tolerating tube feeds at this time. Foley intact. ETT and OG intact. Will continue to monitor.

## 2020-08-18 NOTE — Progress Notes (Signed)
Pharmacy Electrolyte Monitoring Consult:  Pharmacy consulted to assist in monitoring and replacing electrolytes in this 44 y.o. female admitted on 08/13/2020 with Shortness of Breath   Labs:  Sodium (mmol/L)  Date Value  08/18/2020 148 (H)  03/03/2014 140   Potassium (mmol/L)  Date Value  08/18/2020 3.3 (L)  03/03/2014 3.9   Magnesium (mg/dL)  Date Value  08/18/2020 2.1   Phosphorus (mg/dL)  Date Value  08/18/2020 2.5   Calcium (mg/dL)  Date Value  08/18/2020 7.6 (L)   Calcium, Total (mg/dL)  Date Value  03/03/2014 9.3   Albumin (g/dL)  Date Value  08/18/2020 2.8 (L)  03/03/2014 4.2    Assessment: 44 yo female here with COVID PNA  Goal: Electrolyte WNL  Plan: Potassium 40 mEq per tube once. Sodium slightly elevated but stable, will continue to monitor and adjust free water flushes if necessary.   Tawnya Crook, PharmD 08/18/2020 3:07 PM

## 2020-08-18 NOTE — Progress Notes (Signed)
Patient placed back in supine at 0330 with no issue.

## 2020-08-19 ENCOUNTER — Inpatient Hospital Stay: Admit: 2020-08-19 | Payer: Medicaid Other

## 2020-08-19 LAB — BASIC METABOLIC PANEL
Anion gap: 8 (ref 5–15)
BUN: 16 mg/dL (ref 6–20)
CO2: 31 mmol/L (ref 22–32)
Calcium: 7.8 mg/dL — ABNORMAL LOW (ref 8.9–10.3)
Chloride: 112 mmol/L — ABNORMAL HIGH (ref 98–111)
Creatinine, Ser: 0.83 mg/dL (ref 0.44–1.00)
GFR calc Af Amer: >60 mL/min (ref >60)
GFR calc non Af Amer: >60 mL/min (ref >60)
Glucose, Bld: 196 mg/dL — ABNORMAL HIGH (ref 70–99)
Potassium: 4.1 mmol/L (ref 3.5–5.1)
Sodium: 151 mmol/L — ABNORMAL HIGH (ref 135–145)

## 2020-08-19 LAB — CBC WITH DIFFERENTIAL/PLATELET
Abs Immature Granulocytes: 0.65 10*3/uL — ABNORMAL HIGH (ref 0.00–0.07)
Basophils Absolute: 0 10*3/uL (ref 0.0–0.1)
Basophils Relative: 0 %
Eosinophils Absolute: 0 10*3/uL (ref 0.0–0.5)
Eosinophils Relative: 0 %
HCT: 20.9 % — ABNORMAL LOW (ref 36.0–46.0)
Hemoglobin: 6.4 g/dL — ABNORMAL LOW (ref 12.0–15.0)
Immature Granulocytes: 6 %
Lymphocytes Relative: 2 %
Lymphs Abs: 0.3 10*3/uL — ABNORMAL LOW (ref 0.7–4.0)
MCH: 21.5 pg — ABNORMAL LOW (ref 26.0–34.0)
MCHC: 30.6 g/dL (ref 30.0–36.0)
MCV: 70.4 fL — ABNORMAL LOW (ref 80.0–100.0)
Monocytes Absolute: 0.1 10*3/uL (ref 0.1–1.0)
Monocytes Relative: 1 %
Neutro Abs: 10.4 10*3/uL — ABNORMAL HIGH (ref 1.7–7.7)
Neutrophils Relative %: 91 %
Platelets: 204 10*3/uL (ref 150–400)
RBC: 2.97 MIL/uL — ABNORMAL LOW (ref 3.87–5.11)
RDW: 23.4 % — ABNORMAL HIGH (ref 11.5–15.5)
Smear Review: NORMAL
WBC: 11.4 10*3/uL — ABNORMAL HIGH (ref 4.0–10.5)
nRBC: 0.3 % — ABNORMAL HIGH (ref 0.0–0.2)

## 2020-08-19 LAB — HEMOGLOBIN AND HEMATOCRIT, BLOOD
HCT: 23.6 % — ABNORMAL LOW (ref 36.0–46.0)
HCT: 24.5 % — ABNORMAL LOW (ref 36.0–46.0)
Hemoglobin: 7 g/dL — ABNORMAL LOW (ref 12.0–15.0)
Hemoglobin: 7.3 g/dL — ABNORMAL LOW (ref 12.0–15.0)

## 2020-08-19 LAB — GLUCOSE, CAPILLARY
Glucose-Capillary: 150 mg/dL — ABNORMAL HIGH (ref 70–99)
Glucose-Capillary: 154 mg/dL — ABNORMAL HIGH (ref 70–99)
Glucose-Capillary: 175 mg/dL — ABNORMAL HIGH (ref 70–99)
Glucose-Capillary: 189 mg/dL — ABNORMAL HIGH (ref 70–99)
Glucose-Capillary: 196 mg/dL — ABNORMAL HIGH (ref 70–99)
Glucose-Capillary: 227 mg/dL — ABNORMAL HIGH (ref 70–99)

## 2020-08-19 LAB — PREPARE RBC (CROSSMATCH)

## 2020-08-19 LAB — MAGNESIUM: Magnesium: 2.2 mg/dL (ref 1.7–2.4)

## 2020-08-19 LAB — PROCALCITONIN: Procalcitonin: 0.17 ng/mL

## 2020-08-19 LAB — PHOSPHORUS: Phosphorus: 2.7 mg/dL (ref 2.5–4.6)

## 2020-08-19 MED ORDER — INSULIN ASPART 100 UNIT/ML ~~LOC~~ SOLN
0.0000 [IU] | SUBCUTANEOUS | Status: DC
  Administered 2020-08-19: 3 [IU] via SUBCUTANEOUS
  Administered 2020-08-19: 4 [IU] via SUBCUTANEOUS
  Administered 2020-08-19: 7 [IU] via SUBCUTANEOUS
  Administered 2020-08-19 (×2): 4 [IU] via SUBCUTANEOUS
  Administered 2020-08-20: 7 [IU] via SUBCUTANEOUS
  Administered 2020-08-20 (×3): 4 [IU] via SUBCUTANEOUS
  Administered 2020-08-20: 7 [IU] via SUBCUTANEOUS
  Administered 2020-08-20: 11 [IU] via SUBCUTANEOUS
  Administered 2020-08-21 (×2): 4 [IU] via SUBCUTANEOUS
  Administered 2020-08-21: 7 [IU] via SUBCUTANEOUS
  Administered 2020-08-21: 11 [IU] via SUBCUTANEOUS
  Administered 2020-08-21: 4 [IU] via SUBCUTANEOUS
  Administered 2020-08-22: 3 [IU] via SUBCUTANEOUS
  Administered 2020-08-22: 4 [IU] via SUBCUTANEOUS
  Administered 2020-08-22: 7 [IU] via SUBCUTANEOUS
  Administered 2020-08-22: 4 [IU] via SUBCUTANEOUS
  Administered 2020-08-22: 7 [IU] via SUBCUTANEOUS
  Administered 2020-08-22: 4 [IU] via SUBCUTANEOUS
  Administered 2020-08-22: 7 [IU] via SUBCUTANEOUS
  Administered 2020-08-23 (×3): 4 [IU] via SUBCUTANEOUS
  Administered 2020-08-23: 7 [IU] via SUBCUTANEOUS
  Administered 2020-08-23: 4 [IU] via SUBCUTANEOUS
  Administered 2020-08-24: 3 [IU] via SUBCUTANEOUS
  Administered 2020-08-24: 7 [IU] via SUBCUTANEOUS
  Administered 2020-08-24: 3 [IU] via SUBCUTANEOUS
  Administered 2020-08-24 – 2020-08-25 (×4): 4 [IU] via SUBCUTANEOUS
  Administered 2020-08-25 – 2020-08-27 (×6): 3 [IU] via SUBCUTANEOUS
  Administered 2020-08-27: 2 [IU] via SUBCUTANEOUS
  Administered 2020-08-27 – 2020-08-28 (×2): 3 [IU] via SUBCUTANEOUS
  Administered 2020-08-28 (×2): 4 [IU] via SUBCUTANEOUS
  Administered 2020-08-28 (×2): 3 [IU] via SUBCUTANEOUS
  Administered 2020-08-28: 4 [IU] via SUBCUTANEOUS
  Administered 2020-08-29 (×3): 3 [IU] via SUBCUTANEOUS
  Administered 2020-08-30: 4 [IU] via SUBCUTANEOUS
  Administered 2020-08-30 – 2020-08-31 (×5): 3 [IU] via SUBCUTANEOUS
  Administered 2020-08-31 (×2): 4 [IU] via SUBCUTANEOUS
  Administered 2020-08-31 – 2020-09-01 (×3): 3 [IU] via SUBCUTANEOUS
  Administered 2020-09-01: 4 [IU] via SUBCUTANEOUS
  Administered 2020-09-01: 3 [IU] via SUBCUTANEOUS
  Administered 2020-09-01: 7 [IU] via SUBCUTANEOUS
  Administered 2020-09-01: 4 [IU] via SUBCUTANEOUS
  Administered 2020-09-02 (×3): 3 [IU] via SUBCUTANEOUS
  Administered 2020-09-02: 4 [IU] via SUBCUTANEOUS
  Administered 2020-09-02: 3 [IU] via SUBCUTANEOUS
  Administered 2020-09-02: 4 [IU] via SUBCUTANEOUS
  Administered 2020-09-02 – 2020-09-09 (×16): 3 [IU] via SUBCUTANEOUS
  Administered 2020-09-10: 4 [IU] via SUBCUTANEOUS
  Administered 2020-09-10 – 2020-09-15 (×16): 3 [IU] via SUBCUTANEOUS
  Filled 2020-08-19 (×104): qty 1

## 2020-08-19 MED ORDER — SODIUM CHLORIDE 0.9% IV SOLUTION: Freq: Once | INTRAVENOUS | Status: AC

## 2020-08-19 MED ORDER — FUROSEMIDE 10 MG/ML IJ SOLN
80.0000 mg | Freq: Once | INTRAMUSCULAR | Status: AC
  Administered 2020-08-19: 80 mg via INTRAVENOUS
  Filled 2020-08-19: qty 8

## 2020-08-19 MED ORDER — FREE WATER
200.0000 mL | Freq: Four times a day (QID) | Status: DC
  Administered 2020-08-19 – 2020-09-04 (×63): 200 mL

## 2020-08-19 NOTE — Progress Notes (Signed)
Pharmacy Electrolyte Monitoring Consult:  Pharmacy consulted to assist in monitoring and replacing electrolytes in this 44 y.o. female admitted on 08/13/2020 with Shortness of Breath   Labs:  Sodium (mmol/L)  Date Value  08/19/2020 151 (H)  03/03/2014 140   Potassium (mmol/L)  Date Value  08/19/2020 4.1  03/03/2014 3.9   Magnesium (mg/dL)  Date Value  08/19/2020 2.2   Phosphorus (mg/dL)  Date Value  08/19/2020 2.7   Calcium (mg/dL)  Date Value  08/19/2020 7.8 (L)   Calcium, Total (mg/dL)  Date Value  03/03/2014 9.3   Albumin (g/dL)  Date Value  08/18/2020 2.8 (L)  03/03/2014 4.2    Assessment: 44 yo female here with COVID PNA  Goal: Electrolyte WNL  Plan: Sodium increasing, now at 151. Free water flushes started this morning. Continue to follow along.   Tawnya Crook, PharmD 08/19/2020 1:55 PM

## 2020-08-19 NOTE — Progress Notes (Signed)
Assisted tele visit to patient with daughter.  Estefano Victory Anderson, RN   

## 2020-08-19 NOTE — Progress Notes (Signed)
Pt unresponsive most of shift. Coughing when suctioned on evening assessment. Pt remains proned. Too unstable to reposition to supine this shift. Head and arms repositioned q2h. Husband allowed to see pt outside of room to discuss pt and POC this shift. Tolerating tube feeds. ETT and OG intact. Bear hugger applied for hypothermia.

## 2020-08-19 NOTE — Progress Notes (Signed)
CRITICAL CARE NOTE  CRITICAL CARE NOTE 9/16 COVID 19 pnuemonia 9/17 PRONED, ARDS 9/18 severe hypoxia, ARDS 9/19 ARDS, severe hypoxia, proned last night 9/20 severe ARDS, pneumonia, resp failure 9/21 severe ARDS, severe hypoxia 9/22 remains proned  CC  follow up respiratory failure  SUBJECTIVE Patient remains critically ill Prognosis is guarded   BP 105/72   Pulse 95   Temp (!) 96.8 F (36 C)   Resp (!) 24   Ht 5' 9.13" (1.756 m)   Wt 120.4 kg   LMP  (LMP Unknown)   SpO2 98%   BMI 39.05 kg/m    I/O last 3 completed shifts: In: 2047.3 [I.V.:1717.3; Blood:330] Out: 1696 [Urine:1025] No intake/output data recorded.  SpO2: 98 % O2 Flow Rate (L/min): 50 L/min FiO2 (%): 100 %  Estimated body mass index is 39.05 kg/m as calculated from the following:   Height as of this encounter: 5' 9.13" (1.756 m).   Weight as of this encounter: 120.4 kg.  SIGNIFICANT EVENTS   REVIEW OF SYSTEMS  PATIENT IS UNABLE TO PROVIDE COMPLETE REVIEW OF SYSTEMS DUE TO SEVERE CRITICAL ILLNESS      COVID-19 DISASTER DECLARATION:   FULL CONTACT PHYSICAL EXAMINATION WAS NOT POSSIBLE DUE TO TREATMENT OF COVID-19  AND CONSERVATION OF PERSONAL PROTECTIVE EQUIPMENT, LIMITED EXAM FINDINGS INCLUDE-   PHYSICAL EXAMINATION:  GENERAL:critically ill appearing, +resp distress NEUROLOGIC: obtunded, GCS<8   Patient assessed or the symptoms described in the history of present illness.  In the context of the Global COVID-19 pandemic, which necessitated consideration that the patient might be at risk for infection with the SARS-CoV-2 virus that causes COVID-19, Institutional protocols and algorithms that pertain to the evaluation of patients at risk for COVID-19 are in a state of rapid change based on information released by regulatory bodies including the CDC and federal and state organizations. These policies and algorithms were followed during the patient's care while in  hospital.    MEDICATIONS: I have reviewed all medications and confirmed regimen as documented   CULTURE RESULTS   Recent Results (from the past 240 hour(s))  Culture, blood (routine x 2)     Status: None   Collection Time: 08/13/20  6:06 PM   Specimen: BLOOD  Result Value Ref Range Status   Specimen Description BLOOD RIGHT ANTECUBITAL  Final   Special Requests   Final    BOTTLES DRAWN AEROBIC AND ANAEROBIC Blood Culture adequate volume   Culture   Final    NO GROWTH 5 DAYS Performed at [facility], [address], [place], [place] [address]    Report Status 08/18/2020 FINAL  Final  Culture, blood (routine x 2)     Status: None   Collection Time: 08/13/20  6:06 PM   Specimen: BLOOD  Result Value Ref Range Status   Specimen Description BLOOD LEFT ANTECUBITAL  Final   Special Requests   Final    BOTTLES DRAWN AEROBIC AND ANAEROBIC Blood Culture adequate volume   Culture   Final    NO GROWTH 5 DAYS Performed at [facility], [address], [place], [place] [address]    Report Status 08/18/2020 FINAL  Final  MRSA PCR Screening     Status: None   Collection Time: 08/13/20 11:34 PM   Specimen: Nasal Mucosa; Nasopharyngeal  Result Value Ref Range Status   MRSA by PCR NEGATIVE NEGATIVE Final    Comment:        The GeneXpert MRSA Assay (FDA approved for NASAL specimens only),  is one component of a comprehensive MRSA colonization surveillance program. It is not intended to diagnose MRSA infection nor to guide or monitor treatment for MRSA infections. Performed at [facility], [address] [place], [place] [address]           IMAGING    DG Chest Port 1 View  Result Date: 08/18/2020 CLINICAL DATA:  Hypoxia. EXAM: PORTABLE CHEST 1 VIEW COMPARISON:  August 18, 2020 (10:48 a.m.) FINDINGS: There is stable endotracheal tube, nasogastric tube and left-sided PICC line positioning. Marked severity diffuse bilateral infiltrates are seen  right slightly greater than left. This is increased in severity when compared to the prior study. A small right pleural effusion is suspected. No pneumothorax is identified. The heart size and mediastinal contours are limited in visualization. The visualized skeletal structures are unremarkable. IMPRESSION: Marked severity diffuse bilateral infiltrates, right slightly greater than left, increased in severity when compared to the prior study. Electronically Signed   By: Virgina Norfolk M.D.   On: 08/18/2020 20:43   DG Chest Port 1 View  Result Date: 08/18/2020 CLINICAL DATA:  COVID, shortness of breath EXAM: PORTABLE CHEST 1 VIEW COMPARISON:  08/17/2020 FINDINGS: Endotracheal tube and NG tube remain in place, unchanged. Patchy bilateral airspace disease, worsening since prior study, most pronounced in the right lung base. Suspect small bilateral effusions. No pneumothorax. No acute bony abnormality. IMPRESSION: Worsening bilateral airspace disease.  Suspect small effusions. Electronically Signed   By: Rolm Baptise M.D.   On: 08/18/2020 11:09     Nutrition Status: Nutrition Problem: Inadequate oral intake Etiology: inability to eat Signs/Symptoms: NPO status Interventions: Tube feeding     Indwelling Urinary Catheter continued, requirement due to   Reason to continue Indwelling Urinary Catheter strict Intake/Output monitoring for hemodynamic instability   Central Line/ continued, requirement due to  Reason to continue [place] of central venous pressure or other hemodynamic parameters and poor IV access   Ventilator continued, requirement due to severe respiratory failure   Ventilator Sedation RASS 0 to -2      ASSESSMENT AND PLAN SYNOPSIS  Acute hypoxemic respiratory failure due to COVID-19 pneumonia / ARDS Mechanical ventilation via ARDS protocol, target PRVC 6 cc/kg Wean PEEP and FiO2 as able Goal plateau pressure less than 30, driving pressure less than  15 Paralytics if necessary for vent synchrony, gas exchange Cycle prone positioning if necessary for oxygenation Deep sedation per PAD protocol, goal RASS -4, currently fentanyl, midazolam Diuresis as blood pressure and renal function can tolerate, goal CVP 5-8.   diuresis as tolerated based on Kidney function VAP prevention order set Remdesivir + BARIC IV STEROIDS  Check PROCALCITONIN   Severe ACUTE Hypoxic and Hypercapnic Respiratory Failure -continue Full MV support -continue Bronchodilator Therapy -Wean Fio2 and PEEP as tolerated -VAP/VENT bundle implementation  Morbid obesity, possible OSA.   Will certainly impact respiratory mechanics, ventilator weaning Suspect will need to consider additional PEEP    NEUROLOGY Acute toxic metabolic encephalopathy, need for sedation Goal RASS -2 to -3  SHOCK-SEPSIS due to COVID present on admission -use vasopressors to keep MAP>65  CARDIAC ICU monitoring  ID -continue IV abx as prescibed -follow up cultures  GI GI PROPHYLAXIS as indicated   DIET-->TF's as tolerated Constipation protocol as indicated  ENDO - will use ICU hypoglycemic\Hyperglycemia protocol if indicated     ELECTROLYTES -follow labs as needed -replace as needed -pharmacy consultation and following   DVT/GI PRX ordered and assessed TRANSFUSIONS AS NEEDED MONITOR FSBS I  Assessed the need for Labs I Assessed the need for Foley I Assessed the need for Central Venous Line Family Discussion when available I Assessed the need for Mobilization I made an Assessment of medications to be adjusted accordingly Safety Risk assessment completed   CASE DISCUSSED IN MULTIDISCIPLINARY ROUNDS WITH ICU TEAM  Critical Care Time devoted to patient care services described in this note is 45 minutes.   Overall, patient is critically ill, prognosis is guarded.  Patient with Multiorgan failure and at high risk for cardiac arrest and death.    Corrin Parker,  M.D.  [facility]  Medical Director [place] Director [facility] Cardio-Pulmonary Department

## 2020-08-19 NOTE — Progress Notes (Addendum)
2030 Patients head reposition while proned. Patient oxygen saturations dropped down as low as 65. Provider aware instructed to give PRN paralytic.  Took patient over an hour to regain  Oxygen sats 88-90.  Provider is aware.

## 2020-08-20 ENCOUNTER — Inpatient Hospital Stay: Payer: Medicaid Other

## 2020-08-20 ENCOUNTER — Inpatient Hospital Stay
Admit: 2020-08-20 | Discharge: 2020-08-20 | Disposition: A | Discharge status: Home / Self Care | Payer: Medicaid Other | Attending: Internal Medicine | Admitting: Internal Medicine

## 2020-08-20 LAB — BASIC METABOLIC PANEL
Anion gap: 11 (ref 5–15)
BUN: 25 mg/dL — ABNORMAL HIGH (ref 6–20)
CO2: 27 mmol/L (ref 22–32)
Calcium: 7.8 mg/dL — ABNORMAL LOW (ref 8.9–10.3)
Chloride: 108 mmol/L (ref 98–111)
Creatinine, Ser: 0.93 mg/dL (ref 0.44–1.00)
GFR calc Af Amer: >60 mL/min (ref >60)
GFR calc non Af Amer: >60 mL/min (ref >60)
Glucose, Bld: 251 mg/dL — ABNORMAL HIGH (ref 70–99)
Potassium: 3.9 mmol/L (ref 3.5–5.1)
Sodium: 146 mmol/L — ABNORMAL HIGH (ref 135–145)

## 2020-08-20 LAB — BPAM RBC
Blood Product Expiration Date: 202110152359
Blood Product Expiration Date: 202110232359
ISSUE DATE / TIME: 202109191140
ISSUE DATE / TIME: 202109220610
Unit Type and Rh: 5100
Unit Type and Rh: 5100

## 2020-08-20 LAB — CBC WITH DIFFERENTIAL/PLATELET
Abs Immature Granulocytes: 0.69 10*3/uL — ABNORMAL HIGH (ref 0.00–0.07)
Basophils Absolute: 0 10*3/uL (ref 0.0–0.1)
Basophils Relative: 0 %
Eosinophils Absolute: 0 10*3/uL (ref 0.0–0.5)
Eosinophils Relative: 0 %
HCT: 21.8 % — ABNORMAL LOW (ref 36.0–46.0)
Hemoglobin: 6.6 g/dL — ABNORMAL LOW (ref 12.0–15.0)
Immature Granulocytes: 7 %
Lymphocytes Relative: 3 %
Lymphs Abs: 0.3 10*3/uL — ABNORMAL LOW (ref 0.7–4.0)
MCH: 22.2 pg — ABNORMAL LOW (ref 26.0–34.0)
MCHC: 30.3 g/dL (ref 30.0–36.0)
MCV: 73.4 fL — ABNORMAL LOW (ref 80.0–100.0)
Monocytes Absolute: 0.2 10*3/uL (ref 0.1–1.0)
Monocytes Relative: 2 %
Neutro Abs: 8.3 10*3/uL — ABNORMAL HIGH (ref 1.7–7.7)
Neutrophils Relative %: 88 %
Platelets: 241 10*3/uL (ref 150–400)
RBC: 2.97 MIL/uL — ABNORMAL LOW (ref 3.87–5.11)
RDW: 24 % — ABNORMAL HIGH (ref 11.5–15.5)
Smear Review: NORMAL
WBC: 9.4 10*3/uL (ref 4.0–10.5)
nRBC: 0.5 % — ABNORMAL HIGH (ref 0.0–0.2)

## 2020-08-20 LAB — VITAMIN B12: Vitamin B-12: 407 pg/mL (ref 180–914)

## 2020-08-20 LAB — GLUCOSE, CAPILLARY
Glucose-Capillary: 184 mg/dL — ABNORMAL HIGH (ref 70–99)
Glucose-Capillary: 189 mg/dL — ABNORMAL HIGH (ref 70–99)
Glucose-Capillary: 199 mg/dL — ABNORMAL HIGH (ref 70–99)
Glucose-Capillary: 201 mg/dL — ABNORMAL HIGH (ref 70–99)
Glucose-Capillary: 202 mg/dL — ABNORMAL HIGH (ref 70–99)
Glucose-Capillary: 203 mg/dL — ABNORMAL HIGH (ref 70–99)
Glucose-Capillary: 219 mg/dL — ABNORMAL HIGH (ref 70–99)

## 2020-08-20 LAB — TYPE AND SCREEN
ABO/RH(D): O POS
Antibody Screen: NEGATIVE
Unit division: 0
Unit division: 0

## 2020-08-20 LAB — PHOSPHORUS: Phosphorus: 2.3 mg/dL — ABNORMAL LOW (ref 2.5–4.6)

## 2020-08-20 LAB — BLOOD GAS, ARTERIAL
Acid-Base Excess: 6 mmol/L — ABNORMAL HIGH (ref 0.0–2.0)
Acid-Base Excess: 6.9 mmol/L — ABNORMAL HIGH (ref 0.0–2.0)
Bicarbonate: 31.1 mmol/L — ABNORMAL HIGH (ref 20.0–28.0)
Bicarbonate: 33.1 mmol/L — ABNORMAL HIGH (ref 20.0–28.0)
FIO2: 1
FIO2: 1
MECHVT: 460 mL
MECHVT: 400 mL
Mechanical Rate: 24
O2 Saturation: 93.1 %
O2 Saturation: 94.6 %
PEEP: 15 cmH2O
PEEP: 15 cmH2O
Patient temperature: 37
Patient temperature: 37
RATE: 24 resp/min
RATE: 24 resp/min
pCO2 arterial: 48 mmHg (ref 32.0–48.0)
pCO2 arterial: 56 mmHg — ABNORMAL HIGH (ref 32.0–48.0)
pH, Arterial: 7.42 (ref 7.350–7.450)
pH, Arterial: 7.38 (ref 7.350–7.450)
pO2, Arterial: 66 mmHg — ABNORMAL LOW (ref 83.0–108.0)
pO2, Arterial: 75 mmHg — ABNORMAL LOW (ref 83.0–108.0)

## 2020-08-20 LAB — FERRITIN: Ferritin: 44 ng/mL (ref 11–307)

## 2020-08-20 LAB — PREPARE RBC (CROSSMATCH)

## 2020-08-20 LAB — ECHOCARDIOGRAM COMPLETE
Height: 69.134 in
S' Lateral: 3.23 cm
Weight: 4141.12 oz

## 2020-08-20 LAB — RETICULOCYTES
Immature Retic Fract: 33.6 % — ABNORMAL HIGH (ref 2.3–15.9)
RBC.: 3.26 MIL/uL — ABNORMAL LOW (ref 3.87–5.11)
Retic Count, Absolute: 36.8 10*3/uL (ref 19.0–186.0)
Retic Ct Pct: 1.1 % (ref 0.4–3.1)

## 2020-08-20 LAB — MAGNESIUM: Magnesium: 2.2 mg/dL (ref 1.7–2.4)

## 2020-08-20 LAB — IRON AND TIBC
Iron: 32 ug/dL (ref 28–170)
Saturation Ratios: 10 % — ABNORMAL LOW (ref 10.4–31.8)
TIBC: 311 ug/dL (ref 250–450)
UIBC: 279 ug/dL

## 2020-08-20 LAB — PROCALCITONIN: Procalcitonin: 0.12 ng/mL

## 2020-08-20 LAB — FOLATE: Folate: 33 ng/mL (ref >5.9)

## 2020-08-20 MED ORDER — PROSOURCE TF PO LIQD
90.0000 mL | Freq: Two times a day (BID) | ORAL | Status: DC
  Administered 2020-08-20 – 2020-08-27 (×14): 90 mL
  Filled 2020-08-20 (×15): qty 90

## 2020-08-20 MED ORDER — VITAL 1.5 CAL PO LIQD
1000.0000 mL | ORAL | Status: DC
  Administered 2020-08-20 – 2020-08-27 (×8): 1000 mL

## 2020-08-20 MED ORDER — SODIUM CHLORIDE 0.9% IV SOLUTION: Freq: Once | INTRAVENOUS | Status: AC

## 2020-08-20 NOTE — Progress Notes (Signed)
Pt placed in supine position. Pt with desaturations, placed pt back on 100%. ETT secured 26cm at the lip. Edema noted in pt face.

## 2020-08-20 NOTE — Progress Notes (Signed)
Patient proned at 13:30.  VSS.  Head turned to right.  Right arm up and left arm down.

## 2020-08-20 NOTE — Progress Notes (Signed)
*  PRELIMINARY RESULTS* Echocardiogram 2D Echocardiogram has been performed.  Sherrie Sport 08/20/2020, 8:43 AM

## 2020-08-20 NOTE — Progress Notes (Signed)
Assisted tele visit to patient with family member.  Sho Salguero M Malayah Demuro, RN  

## 2020-08-20 NOTE — Progress Notes (Signed)
Nutrition Follow-up  DOCUMENTATION CODES:   Obesity unspecified  INTERVENTION:  Initiate new goal TF regimen of Vital 1.5 Cal at 60 mL/hr (1440 mL goal daily volume) per tube + PROSource 90 mL BID per tube. Provides 2320 kcal, 141 grams of protein, 1094 mL H2O daily.  NUTRITION DIAGNOSIS:   Inadequate oral intake related to inability to eat as evidenced by NPO status.  Ongoing.  GOAL:   Patient will meet greater than or equal to 90% of their needs  Met with TF regimen.  MONITOR:   Vent status, Labs, Weight trends, TF tolerance, I & O's  REASON FOR ASSESSMENT:   Ventilator, Consult Enteral/tube feeding initiation and management  ASSESSMENT:   44 year old female admitted with COVID-19 PNA.  9/17 intubated 9/17 proning protocol initiated  Patient is currently intubated on ventilator support MV: 11.2 L/min Temp (24hrs), Avg:98.2 F (36.8 C), Min:96.6 F (35.9 C), Max:100 F (37.8 C)  Medications reviewed and include: vitamin C 500 mg daily per tube, Decadron 6 mg Q24hrs IV, Colace 50 mg BID per tube, folic acid 1 mg daily IV, free water flush 200 mL Q6hrs, Novolog 0-20 units Q4hrs, MVI daily per tube, thiamine 100 mg daily IV, zinc sulfate 220 mg daily per tube, fentanyl gtt, Versed gtt.  Labs reviewed: CBG 199-202, Sodium 146, BUN 25, Phosphorus 2.3.  I/O: 2675 mL UOP Yesterday (0.9 mL/kg/hr)  Weight trend: 117.4 kg on 9/23; -1.8 kg from 9/16  Enteral Access: 18 Fr. OGT placed 9/17; terminates in stomach per abdominal x-ray 9/17  TF regimen: Vital High Protein at 65 mL/hr  Discussed with RN and on rounds. Patient tolerating tube feeds. Patient now off propofol gtt.  Diet Order:   Diet Order            Diet NPO time specified  Diet effective now                EDUCATION NEEDS:   No education needs have been identified at this time  Skin:  Skin Assessment: Reviewed RN Assessment  Last BM:  Unknown/PTA  Height:   Ht Readings from Last 1  Encounters:  08/19/20 5' 9.13" (1.756 m)   Weight:   Wt Readings from Last 1 Encounters:  08/20/20 117.4 kg   Ideal Body Weight:  65.9 kg  BMI:  Body mass index is 38.07 kg/m.  Estimated Nutritional Needs:   Kcal:  2275  Protein:  135-145 grams  Fluid:  >/= 2 L/day  Jacklynn Barnacle, MS, RD, LDN Pager number available on Amion

## 2020-08-20 NOTE — Progress Notes (Signed)
Patient continues in prone position.  Head positioned to left, left arm up and right arm down. VSS.

## 2020-08-20 NOTE — Progress Notes (Signed)
Patient proned on 85% at beginning of shift. At 0415, patient turned to back, ventilator increased to 100% after turn. Continuous fentanyl and versed running, paralytic given when needed for repositioning.

## 2020-08-20 NOTE — Progress Notes (Signed)
Pharmacy Electrolyte Monitoring Consult:  Pharmacy consulted to assist in monitoring and replacing electrolytes in this 44 y.o. female admitted on 08/13/2020 with Shortness of Breath   Labs:  Sodium (mmol/L)  Date Value  08/20/2020 146 (H)  03/03/2014 140   Potassium (mmol/L)  Date Value  08/20/2020 3.9  03/03/2014 3.9   Magnesium (mg/dL)  Date Value  08/20/2020 2.2   Phosphorus (mg/dL)  Date Value  08/20/2020 2.3 (L)   Calcium (mg/dL)  Date Value  08/20/2020 7.8 (L)   Calcium, Total (mg/dL)  Date Value  03/03/2014 9.3   Albumin (g/dL)  Date Value  08/18/2020 2.8 (L)  03/03/2014 4.2    Assessment: 44 yo female here with COVID PNA  Goal: Electrolyte WNL  Plan: Sodium improving 151>146 on free water flushes started 9/22. Phos slightly low but will follow on tube feeds. Continue to follow along.   Tawnya Crook, PharmD 08/20/2020 12:01 PM

## 2020-08-20 NOTE — Progress Notes (Signed)
NP gave permission to do arterial stick on right arm.

## 2020-08-20 NOTE — Progress Notes (Signed)
Name: Kathy Vega MRN: [id number] DOB: 04-21-1976    ADMISSION DATE:  08/13/2020  BRIEF PATIENT DESCRIPTION: 44 yo unvaccinated female admitted with COVID-19 pneumonia and ARDS   SIGNIFICANT EVENTS/STUDIES:  09/16: Pt admitted to ICU requiring Heated HFNC and NRB, however remained in the ER pending ICU bed availability  09/16: CT Head revealed no acute intracranial abnormality 09/17: Pt with worsening hypoxia requiring mechanical intubation 09/18: [facility] refused to accept pt for ECMO 09/21: Attempted to transfer pt to [facility] and [facility] for possible assessment for ECMO, however both hospitals DO NOT have bed availability and are at capacity and pt may or may NOT be an ECMO candidate at this time  09/23: Pt unproned    PAST MEDICAL HISTORY :   has a past medical history of Anemia and Thyroid disease.  has a past surgical history that includes Leg Surgery and Cholecystectomy. Prior to Admission medications   Medication Sig Start Date End Date Taking? Authorizing Provider  cyclobenzaprine (FLEXERIL) 10 MG tablet Take 1 tablet (10 mg total) by mouth 3 (three) times daily as needed. 03/26/20   Fisher, Linden Dolin, PA-C  ferrous gluconate (FERGON) 324 MG tablet Take 1 tablet (324 mg total) by mouth 2 (two) times daily with a meal. 03/15/18   [name], MD  ferrous sulfate 325 (65 FE) MG EC tablet Take 325 mg by mouth daily.    [provider]  levothyroxine (SYNTHROID, LEVOTHROID) 125 MCG tablet Take 125 mcg by mouth daily before breakfast.    [provider]  traMADol (ULTRAM) 50 MG tablet Take 1 tablet (50 mg total) by mouth every 6 (six) hours as needed. 03/26/20   Caryn Section Linden Dolin, PA-C   No Known Allergies  FAMILY HISTORY:  family history is not on file. SOCIAL HISTORY:  reports that she has never smoked. She has never used smokeless tobacco. She reports that she does not drink alcohol.  REVIEW OF SYSTEMS:  Unable to assess pt mechanically  intubated  SUBJECTIVE:  Unable to assess pt mechanically intubated   VITAL SIGNS: Temp:  [96.4 F (35.8 C)-100 F (37.8 C)] 97.2 F (36.2 C) (09/23 0400) Pulse Rate:  [80-110] 81 (09/23 0400) Resp:  [21-24] 24 (09/23 0400) BP: (99-120)/(62-85) 117/80 (09/23 0400) SpO2:  [90 %-100 %] 100 % (09/23 0400) FiO2 (%):  [75 %-90 %] 75 % (09/23 0400) Weight:  [117.4 kg] 117.4 kg (09/23 0410)  PHYSICAL EXAMINATION: General: acutely ill appearing female, NAD mechanically intubated  Neuro: sedated, not following commands, PERRL HEENT: supple, unable to assess for JVD due to large neck  Cardiovascular: nsr, rrr, no R/G  Lungs: faint rhonchi throughout, even, non labored Abdomen: hypoactive BS x4, obese, soft, non distended  Musculoskeletal: 1+ bilateral lower extremity edema  Skin: intact no rashes or lesions present   Recent Labs  Lab 08/18/20 0509 08/19/20 0349 08/20/20 0400  NA 148* 151* 146*  K 3.3* 4.1 3.9  CL 109 112* 108  CO2 30 31 27   BUN 17 16 25*  CREATININE 0.89 0.83 0.93  GLUCOSE 117* 196* 251*   Recent Labs  Lab 08/17/20 0423 08/17/20 0423 08/18/20 0509 08/18/20 0509 08/19/20 0349 08/19/20 1206 08/19/20 1715  HGB 7.6*   < > 7.7*   < > 6.4* 7.0* 7.3*  HCT 25.0*   < > 25.0*   < > 20.9* 23.6* 24.5*  WBC 13.7*  --  13.6*  --  11.4*  --   --   PLT 237  --  227  --  204  --   --    < > = values in this interval not displayed.   DG Chest Port 1 View  Result Date: 08/18/2020 CLINICAL DATA:  Hypoxia. EXAM: PORTABLE CHEST 1 VIEW COMPARISON:  August 18, 2020 (10:48 a.m.) FINDINGS: There is stable endotracheal tube, nasogastric tube and left-sided PICC line positioning. Marked severity diffuse bilateral infiltrates are seen right slightly greater than left. This is increased in severity when compared to the prior study. A small right pleural effusion is suspected. No pneumothorax is identified. The heart size and mediastinal contours are limited in visualization. The  visualized skeletal structures are unremarkable. IMPRESSION: Marked severity diffuse bilateral infiltrates, right slightly greater than left, increased in severity when compared to the prior study. Electronically Signed   By: Virgina Norfolk M.D.   On: 08/18/2020 20:43   DG Chest Port 1 View  Result Date: 08/18/2020 CLINICAL DATA:  COVID, shortness of breath EXAM: PORTABLE CHEST 1 VIEW COMPARISON:  08/17/2020 FINDINGS: Endotracheal tube and NG tube remain in place, unchanged. Patchy bilateral airspace disease, worsening since prior study, most pronounced in the right lung base. Suspect small bilateral effusions. No pneumothorax. No acute bony abnormality. IMPRESSION: Worsening bilateral airspace disease.  Suspect small effusions. Electronically Signed   By: Rolm Baptise M.D.   On: 08/18/2020 11:09    ASSESSMENT / PLAN:  Acute hypoxic respiratory failure secondary to COVID-19 pneumonia and ARDS Morbid Obesity, possible OSA  Full vent support for now-vent settings reviewed and established VAP bundle implemented SBT once all parameters met  Will keep net negative  Continue IV steroids Continue BARIC Completed course of remdesivir, azithromycin, and ceftriaxone  Maintain airborne and contact precautions  As needed bronchodilators (MDI) Vitamin C and zinc Antitussives Trend inflammatory markers Trend WBC and monitor fever curve Trend PCT   Mildly elevated troponin likely demand ischemia in the setting of acute respiratory failure Hypotension secondary to sedating medications  Continuous telemetry monitoring  Echo pending  Prn levophed to maintain map >65  Mild acute renal failure  Hypokalemia  Hypernatremia  Trend BMP  Replace electrolytes as indicated  Monitor UOP Avoid nephrotoxic medications   Elevated liver enzymes  Trend hepatic panel   Anemia without obvious acute blood loss Trend CBC Monitor for s/sx of bleeding  Transfuse for hgb <7  Hyperglycemia  CBG's q4hrs   SSI   Acute encephalopathy secondary to COVID-19  Mechanical ventilation pain/discomfort Maintain RASS goal -1 to -2 Continue versed and fentanyl gtts to maintain RASS goal Prn vecuronium for vent dyssynchrony   WUA daily   Best Practice: VTE px: subq heparin and SCD's; if pts hgb continues to decrease will discontinue subq heparin  Diet: continue TF's  Code status: Full code  Disposition: ICU Family: No family at bedside to update at this time   Marda Stalker, [place] Pager [phone] (please enter 7 digits) PCCM Consult Pager [phone] (please enter 7 digits)

## 2020-08-20 NOTE — Progress Notes (Signed)
Head turned to the right, left arm down and right arm up

## 2020-08-21 LAB — COMPREHENSIVE METABOLIC PANEL
ALT: 39 U/L (ref 0–44)
AST: 32 U/L (ref 15–41)
Albumin: 2.6 g/dL — ABNORMAL LOW (ref 3.5–5.0)
Alkaline Phosphatase: 70 U/L (ref 38–126)
Anion gap: 11 (ref 5–15)
BUN: 24 mg/dL — ABNORMAL HIGH (ref 6–20)
CO2: 30 mmol/L (ref 22–32)
Calcium: 8.5 mg/dL — ABNORMAL LOW (ref 8.9–10.3)
Chloride: 106 mmol/L (ref 98–111)
Creatinine, Ser: 0.96 mg/dL (ref 0.44–1.00)
GFR calc Af Amer: >60 mL/min (ref >60)
GFR calc non Af Amer: >60 mL/min (ref >60)
Glucose, Bld: 317 mg/dL — ABNORMAL HIGH (ref 70–99)
Potassium: 4.4 mmol/L (ref 3.5–5.1)
Sodium: 147 mmol/L — ABNORMAL HIGH (ref 135–145)
Total Bilirubin: 0.4 mg/dL (ref 0.3–1.2)
Total Protein: 6 g/dL — ABNORMAL LOW (ref 6.5–8.1)

## 2020-08-21 LAB — GLUCOSE, CAPILLARY
Glucose-Capillary: 267 mg/dL — ABNORMAL HIGH (ref 70–99)
Glucose-Capillary: 229 mg/dL — ABNORMAL HIGH (ref 70–99)
Glucose-Capillary: 173 mg/dL — ABNORMAL HIGH (ref 70–99)
Glucose-Capillary: 157 mg/dL — ABNORMAL HIGH (ref 70–99)
Glucose-Capillary: 193 mg/dL — ABNORMAL HIGH (ref 70–99)

## 2020-08-21 LAB — CBC WITH DIFFERENTIAL/PLATELET
Abs Immature Granulocytes: 0.63 10*3/uL — ABNORMAL HIGH (ref 0.00–0.07)
Basophils Absolute: 0 10*3/uL (ref 0.0–0.1)
Basophils Relative: 0 %
Eosinophils Absolute: 0 10*3/uL (ref 0.0–0.5)
Eosinophils Relative: 0 %
HCT: 26 % — ABNORMAL LOW (ref 36.0–46.0)
Hemoglobin: 7.6 g/dL — ABNORMAL LOW (ref 12.0–15.0)
Immature Granulocytes: 7 %
Lymphocytes Relative: 4 %
Lymphs Abs: 0.3 10*3/uL — ABNORMAL LOW (ref 0.7–4.0)
MCH: 22.6 pg — ABNORMAL LOW (ref 26.0–34.0)
MCHC: 29.2 g/dL — ABNORMAL LOW (ref 30.0–36.0)
MCV: 77.2 fL — ABNORMAL LOW (ref 80.0–100.0)
Monocytes Absolute: 0.3 10*3/uL (ref 0.1–1.0)
Monocytes Relative: 3 %
Neutro Abs: 7.5 10*3/uL (ref 1.7–7.7)
Neutrophils Relative %: 86 %
Platelets: 296 10*3/uL (ref 150–400)
RBC: 3.37 MIL/uL — ABNORMAL LOW (ref 3.87–5.11)
RDW: 24.8 % — ABNORMAL HIGH (ref 11.5–15.5)
Smear Review: NORMAL
WBC: 8.7 10*3/uL (ref 4.0–10.5)
nRBC: 0.5 % — ABNORMAL HIGH (ref 0.0–0.2)

## 2020-08-21 LAB — PROCALCITONIN: Procalcitonin: <0.1 ng/mL

## 2020-08-21 LAB — BPAM RBC
Blood Product Expiration Date: 202110252359
ISSUE DATE / TIME: 202109231117
Unit Type and Rh: 5100

## 2020-08-21 LAB — TYPE AND SCREEN
ABO/RH(D): O POS
Antibody Screen: NEGATIVE
Unit division: 0

## 2020-08-21 LAB — MAGNESIUM: Magnesium: 2.5 mg/dL — ABNORMAL HIGH (ref 1.7–2.4)

## 2020-08-21 LAB — PHOSPHORUS: Phosphorus: 2.6 mg/dL (ref 2.5–4.6)

## 2020-08-21 MED ORDER — METRONIDAZOLE IN NACL 5-0.79 MG/ML-% IV SOLN
500.0000 mg | Freq: Three times a day (TID) | INTRAVENOUS | Status: AC
  Administered 2020-08-21 – 2020-08-26 (×15): 500 mg via INTRAVENOUS
  Filled 2020-08-21 (×16): qty 100

## 2020-08-21 MED ORDER — INSULIN ASPART 100 UNIT/ML ~~LOC~~ SOLN
5.0000 [IU] | SUBCUTANEOUS | Status: DC
  Administered 2020-08-21 – 2020-09-04 (×80): 5 [IU] via SUBCUTANEOUS
  Filled 2020-08-21 (×71): qty 1

## 2020-08-21 NOTE — Progress Notes (Signed)
Name: Kathy Vega MRN: [id number] DOB: 19-Sep-1976    ADMISSION DATE:  08/13/2020  BRIEF PATIENT DESCRIPTION: 44 yo unvaccinated female admitted with COVID-19 pneumonia and ARDS   SIGNIFICANT EVENTS/STUDIES:  09/16: Pt admitted to ICU requiring Heated HFNC and NRB, however remained in the ER pending ICU bed availability  09/16: CT Head revealed no acute intracranial abnormality 09/17: Pt with worsening hypoxia requiring mechanical intubation 09/18: [facility] refused to accept pt for ECMO 09/21: Attempted to transfer pt to [facility] for possible assessment for ECMO, however both hospitals DO NOT have bed availability and are at capacity and pt may or may NOT be an ECMO candidate at this time  09/23: Pt unproned  09/24:  Resumed proning protocol    PAST MEDICAL HISTORY :   has a past medical history of Anemia and Thyroid disease.  has a past surgical history that includes Leg Surgery and Cholecystectomy. Prior to Admission medications   Medication Sig Start Date End Date Taking? Authorizing Provider  cyclobenzaprine (FLEXERIL) 10 MG tablet Take 1 tablet (10 mg total) by mouth 3 (three) times daily as needed. 03/26/20   Fisher, Linden Dolin, PA-C  ferrous gluconate (FERGON) 324 MG tablet Take 1 tablet (324 mg total) by mouth 2 (two) times daily with a meal. 03/15/18   [name], MD  ferrous sulfate 325 (65 FE) MG EC tablet Take 325 mg by mouth daily.    [provider]  levothyroxine (SYNTHROID, LEVOTHROID) 125 MCG tablet Take 125 mcg by mouth daily before breakfast.    [provider]  traMADol (ULTRAM) 50 MG tablet Take 1 tablet (50 mg total) by mouth every 6 (six) hours as needed. 03/26/20   Caryn Section Linden Dolin, PA-C   No Known Allergies  FAMILY HISTORY:  family history is not on file. SOCIAL HISTORY:  reports that she has never smoked. She has never used smokeless tobacco. She reports that she does not drink alcohol.  REVIEW OF SYSTEMS:  Unable to assess pt  mechanically intubated  SUBJECTIVE:  Unable to assess pt mechanically intubated   VITAL SIGNS: Temp:  [97.7 F (36.5 C)-99.7 F (37.6 C)] 98.8 F (37.1 C) (09/24 1800) Pulse Rate:  [96-122] 122 (09/24 1945) Resp:  [17-27] 24 (09/24 1945) BP: (112-140)/(66-89) 127/75 (09/24 1800) SpO2:  [82 %-100 %] 94 % (09/24 1945) FiO2 (%):  [80 %-100 %] 80 % (09/24 1945) Weight:  [117.6 kg] 117.6 kg (09/24 0415)  PHYSICAL EXAMINATION: Physical examination is limited due to need for PPE/CAPR General: acutely ill appearing female, NAD mechanically ventilated, patient prone Neuro: sedated, PERRL HEENT: supple  Cardiovascular: Monitor shows sinus rhythm  Lungs: Coarse breath sounds throughout.  No wheezes.  Abdomen: Patient prone unable to assess Musculoskeletal: 1+ bilateral lower extremity edema  Skin: intact no rashes or lesions present   Recent Labs  Lab 08/19/20 0349 08/20/20 0400 08/21/20 0400  NA 151* 146* 147*  K 4.1 3.9 4.4  CL 112* 108 106  CO2 _0 BUN 16 25* 24*  CREATININE 0.83 0.93 0.96  GLUCOSE 196* 251* 317*   Recent Labs  Lab 08/19/20 0349 08/19/20 1206 08/19/20 1715 08/20/20 0400 08/21/20 0400  HGB 6.4*   < > 7.3* 6.6* 7.6*  HCT 20.9*   < > 24.5* 21.8* 26.0*  WBC 11.4*  --   --  9.4 8.7  PLT 204  --   --  241 296   < > = values in this interval  not displayed.   DG Chest Port 1 View  Result Date: 08/20/2020 CLINICAL DATA:  Hypoxia.  COVID-19 positive EXAM: PORTABLE CHEST 1 VIEW COMPARISON:  August 18, 2020 FINDINGS: Endotracheal tube tip is 2.3 cm above the carina. Central catheter tip is in the superior vena cava. Nasogastric tube tip and side port are below the diaphragm. No pneumothorax. Extensive airspace opacity throughout the right lung persists. There is slightly less opacity in the left upper lobe compared to recent prior study. No new opacity evident. Heart is mildly enlarged with pulmonary vascularity normal. No adenopathy evident. No bone  lesions. IMPRESSION: Tube and catheter positions as described without pneumothorax. Widespread airspace opacity persists throughout the right lung. Slightly less opacity left upper lobe. No new opacity evident. Stable cardiac prominence. Electronically Signed   By: Lowella Grip III M.D.   On: 08/20/2020 08:16   ECHOCARDIOGRAM COMPLETE  Result Date: 08/20/2020    ECHOCARDIOGRAM REPORT   Patient Name:   Kathy Vega Date of Exam: 08/20/2020 Medical Rec #:  [id number]    Height:       69.1 in Accession #:    [id number]   Weight:       258.8 lb Date of Birth:  March 26, 1976   BSA:          2.308 m Patient Age:    81 years     BP:           105/72 mmHg Patient Gender: F            HR:           93 bpm. Exam Location:  [facility] Procedure: 2D Echo, Color Doppler and Cardiac Doppler Indications:     Acute respiratory insufficiency 518.82  History:         Patient has no prior history of Echocardiogram examinations. No                  medical history on file.  Sonographer:     Sherrie Sport RDCS (AE) Referring Phys:  [id number] Flora Lipps Diagnosing Phys: Neoma Laming MD  Sonographer Comments: Echo performed with patient supine and on artificial respirator. IMPRESSIONS  1. Left ventricular ejection fraction, by estimation, is 45 to 50%. The left ventricle has mildly decreased function. The left ventricle demonstrates global hypokinesis. There is severe concentric left ventricular hypertrophy. Left ventricular diastolic  parameters are consistent with Grade I diastolic dysfunction (impaired relaxation).  2. Right ventricular systolic function is normal. The right ventricular size is normal.  3. Left atrial size was moderately dilated.  4. Right atrial size was mild to moderately dilated.  5. A small pericardial effusion is present. The pericardial effusion is circumferential.  6. The mitral valve is normal in structure. Trivial mitral valve regurgitation. No evidence of mitral stenosis.  7. The aortic valve is normal in  structure. Aortic valve regurgitation is not visualized. Mild aortic valve sclerosis is present, with no evidence of aortic valve stenosis.  8. The inferior vena cava is normal in size with greater than 50% respiratory variability, suggesting right atrial pressure of 3 mmHg. FINDINGS  Left Ventricle: Left ventricular ejection fraction, by estimation, is 45 to 50%. The left ventricle has mildly decreased function. The left ventricle demonstrates global hypokinesis. The left ventricular internal cavity size was normal in size. There is  severe concentric left ventricular hypertrophy. Left ventricular diastolic parameters are consistent with Grade I diastolic dysfunction (impaired relaxation). Right Ventricle: The right ventricular size is normal.  No increase in right ventricular wall thickness. Right ventricular systolic function is normal. Left Atrium: Left atrial size was moderately dilated. Right Atrium: Right atrial size was mild to moderately dilated. Pericardium: A small pericardial effusion is present. The pericardial effusion is circumferential. Mitral Valve: The mitral valve is normal in structure. Trivial mitral valve regurgitation. No evidence of mitral valve stenosis. Tricuspid Valve: The tricuspid valve is normal in structure. Tricuspid valve regurgitation is mild . No evidence of tricuspid stenosis. Aortic Valve: The aortic valve is normal in structure. Aortic valve regurgitation is not visualized. Mild aortic valve sclerosis is present, with no evidence of aortic valve stenosis. Pulmonic Valve: The pulmonic valve was normal in structure. Pulmonic valve regurgitation is trivial. No evidence of pulmonic stenosis. Aorta: The aortic root is normal in size and structure. Venous: The inferior vena cava is normal in size with greater than 50% respiratory variability, suggesting right atrial pressure of 3 mmHg. IAS/Shunts: No atrial level shunt detected by color flow Doppler. Additional Comments: There is a  small pleural effusion.  LEFT VENTRICLE PLAX 2D LVIDd:         3.74 cm LVIDs:         3.23 cm LV PW:         1.88 cm LV IVS:        1.38 cm LVOT diam:     2.00 cm LVOT Area:     3.14 cm  LEFT ATRIUM         Index LA diam:    3.00 cm 1.30 cm/m                        PULMONIC VALVE AORTA                 PV Vmax:        0.67 m/s Ao Root diam: 2.30 cm PV Peak grad:   1.8 mmHg                       RVOT Peak grad: 1 mmHg   SHUNTS Systemic Diam: 2.00 cm Neoma Laming MD Electronically signed by Neoma Laming MD Signature Date/Time: 08/20/2020/12:55:55 PM    Final     ASSESSMENT / PLAN:  Acute hypoxic respiratory failure secondary to COVID-19 pneumonia and ARDS Morbid Obesity, possible OSA  Full vent support for now-vent settings reviewed and established VAP bundle implemented SBT once all parameters met  Will keep net negative  Continue IV steroids Continue BARIC Completed course of remdesivir, azithromycin, and ceftriaxone  Maintain airborne and contact precautions  As needed bronchodilators (MDI) Vitamin C and zinc Antitussives Trend inflammatory markers Trend WBC and monitor fever curve Trend PCT   Troponin elevation due to demand ischemia Hypertrophic cardiomyopathy Continuous telemetry monitoring  Echo LVEF 45 to 50% Severe concentric LVH Diastolic dysfunction No pressor requirement Initiate low-dose calcium channel blocker Diuresis as needed  Mild acute renal failure  Hypokalemia  Hypernatremia  Trend BMP  Replace electrolytes as indicated  Monitor UOP Avoid nephrotoxic medications   Elevated liver enzymes  Trend hepatic panel   Anemia without obvious acute blood loss Trend CBC Monitor for s/sx of bleeding  Transfuse for hgb <7 Received transfusions 9/23  Hyperglycemia  CBG's q4hrs  SSI   Acute encephalopathy secondary to COVID-19  Mechanical ventilation pain/discomfort Maintain RASS goal -1 to -2 Continue versed and fentanyl gtts to maintain RASS goal PRN  vecuronium for vent dyssynchrony   WUA  daily   Best Practice: VTE px: subq heparin and SCD's Diet: continue TF's  Code status: Full code  Disposition: ICU Family: No family at bedside to update at this time  Multidisciplinary rounds were performed with the ICU team.  Total critical care time 40 minutes  C. Derrill Kay, MD [facility]   *This note was dictated using voice recognition software/Dragon.  Despite best efforts to proofread, errors can occur which can change the meaning.  Any change was purely unintentional.

## 2020-08-21 NOTE — Progress Notes (Signed)
Pt was placed in the supine position without incident. Pt ETT remains secure at 26 at the lip. SPO2 94%. Pt has edema throughout her facial area.

## 2020-08-21 NOTE — Progress Notes (Addendum)
Pharmacy Electrolyte Monitoring Consult:  Pharmacy consulted to assist in monitoring and replacing electrolytes in this 44 y.o. female admitted on 08/13/2020 with Shortness of Breath   Labs:  Sodium (mmol/L)  Date Value  08/21/2020 147 (H)  03/03/2014 140   Potassium (mmol/L)  Date Value  08/21/2020 4.4  03/03/2014 3.9   Magnesium (mg/dL)  Date Value  08/21/2020 2.5 (H)   Phosphorus (mg/dL)  Date Value  08/21/2020 2.6   Calcium (mg/dL)  Date Value  08/21/2020 8.5 (L)   Calcium, Total (mg/dL)  Date Value  03/03/2014 9.3   Albumin (g/dL)  Date Value  08/21/2020 2.6 (L)  03/03/2014 4.2    Assessment: 44 yo female here with COVID PNA  Goal: Electrolyte WNL  Plan: Sodium improving 151>146>147 on free water flushes started 9/22. If Na consistently trending back up, may need to increase free water flushes. All other electrolytes stable. Continue to follow along.   Tawnya Crook, PharmD 08/21/2020 1:17 PM

## 2020-08-21 NOTE — Progress Notes (Signed)
Inpatient Diabetes Program Recommendations  AACE/ADA: New Consensus Statement on Inpatient Glycemic Control (2015)  Target Ranges:  Prepandial:   less than 140 mg/dL      Peak postprandial:   less than 180 mg/dL (1-2 hours)      Critically ill patients:  140 - 180 mg/dL   Results for CEASIA, ELWELL (MRN [id number]) as of 08/21/2020 10:51  Ref. Range 08/20/2020 23:26 08/21/2020 04:11 08/21/2020 07:46  Glucose-Capillary Latest Ref Range: 70 - 99 mg/dL 219 (H)  7 units NOVOLOG  267 (H)  11 units NOVOLOG  229 (H)  4 units NOVOLOG     Admit with: 44 yo unvaccinated female admitted with COVID-19 pneumonia   NO History of Diabetes  Current Insulin Orders: Novolog Resistant Correction Scale/ SSI (0-20 units) Q4 hours     MD- Note patient getting Decadron 6 mg Daily.  Vital tube feedings started yesterday around 1:30pm at 60cc/hr.  CBGs have risen >200 since initiation of the tube feeds.  Please consider the following:  Start Novolog 5 units Q4 hours (HOLD if tube feeds HELD for any reason)     --Will follow patient during hospitalization--  Wyn Quaker RN, MSN, CDE Diabetes Coordinator Inpatient Glycemic Control Team Team Pager: [phone] (8a-5p)

## 2020-08-21 NOTE — Progress Notes (Signed)
Patient proned at 1400.

## 2020-08-22 ENCOUNTER — Inpatient Hospital Stay: Payer: Medicaid Other

## 2020-08-22 LAB — COMPREHENSIVE METABOLIC PANEL
ALT: 38 U/L (ref 0–44)
AST: 31 U/L (ref 15–41)
Albumin: 2.9 g/dL — ABNORMAL LOW (ref 3.5–5.0)
Alkaline Phosphatase: 84 U/L (ref 38–126)
Anion gap: 8 (ref 5–15)
BUN: 18 mg/dL (ref 6–20)
CO2: 31 mmol/L (ref 22–32)
Calcium: 9 mg/dL (ref 8.9–10.3)
Chloride: 104 mmol/L (ref 98–111)
Creatinine, Ser: 0.67 mg/dL (ref 0.44–1.00)
GFR calc Af Amer: >60 mL/min (ref >60)
GFR calc non Af Amer: >60 mL/min (ref >60)
Glucose, Bld: 224 mg/dL — ABNORMAL HIGH (ref 70–99)
Potassium: 4.5 mmol/L (ref 3.5–5.1)
Sodium: 143 mmol/L (ref 135–145)
Total Bilirubin: 0.4 mg/dL (ref 0.3–1.2)
Total Protein: 6.6 g/dL (ref 6.5–8.1)

## 2020-08-22 LAB — MAGNESIUM: Magnesium: 2.3 mg/dL (ref 1.7–2.4)

## 2020-08-22 LAB — PHOSPHORUS: Phosphorus: 2.8 mg/dL (ref 2.5–4.6)

## 2020-08-22 LAB — GLUCOSE, CAPILLARY
Glucose-Capillary: 129 mg/dL — ABNORMAL HIGH (ref 70–99)
Glucose-Capillary: 162 mg/dL — ABNORMAL HIGH (ref 70–99)
Glucose-Capillary: 167 mg/dL — ABNORMAL HIGH (ref 70–99)
Glucose-Capillary: 198 mg/dL — ABNORMAL HIGH (ref 70–99)
Glucose-Capillary: 213 mg/dL — ABNORMAL HIGH (ref 70–99)
Glucose-Capillary: 241 mg/dL — ABNORMAL HIGH (ref 70–99)
Glucose-Capillary: 249 mg/dL — ABNORMAL HIGH (ref 70–99)

## 2020-08-22 LAB — CBC
HCT: 27.6 % — ABNORMAL LOW (ref 36.0–46.0)
Hemoglobin: 8.5 g/dL — ABNORMAL LOW (ref 12.0–15.0)
MCH: 23.2 pg — ABNORMAL LOW (ref 26.0–34.0)
MCHC: 30.8 g/dL (ref 30.0–36.0)
MCV: 75.2 fL — ABNORMAL LOW (ref 80.0–100.0)
Platelets: 285 10*3/uL (ref 150–400)
RBC: 3.67 MIL/uL — ABNORMAL LOW (ref 3.87–5.11)
RDW: 24.8 % — ABNORMAL HIGH (ref 11.5–15.5)
WBC: 9.1 10*3/uL (ref 4.0–10.5)
nRBC: 0 % (ref 0.0–0.2)

## 2020-08-22 MED ORDER — DEXMEDETOMIDINE HCL IN NACL 400 MCG/100ML IV SOLN
0.4000 ug/kg/h | INTRAVENOUS | Status: DC
  Administered 2020-08-22 (×2): 1.2 ug/kg/h via INTRAVENOUS
  Administered 2020-08-23: 0.8 ug/kg/h via INTRAVENOUS
  Administered 2020-08-23 (×7): 1.2 ug/kg/h via INTRAVENOUS
  Administered 2020-08-23: 1 ug/kg/h via INTRAVENOUS
  Administered 2020-08-24 (×6): 1.2 ug/kg/h via INTRAVENOUS
  Administered 2020-08-24: 0.8 ug/kg/h via INTRAVENOUS
  Administered 2020-08-25 (×9): 1.2 ug/kg/h via INTRAVENOUS
  Administered 2020-08-25: 1 ug/kg/h via INTRAVENOUS
  Administered 2020-08-26 – 2020-08-28 (×22): 1.2 ug/kg/h via INTRAVENOUS
  Administered 2020-08-29: 0.629 ug/kg/h via INTRAVENOUS
  Administered 2020-08-29 (×6): 1.2 ug/kg/h via INTRAVENOUS
  Administered 2020-08-30: 0.629 ug/kg/h via INTRAVENOUS
  Administered 2020-08-30: 0.8 ug/kg/h via INTRAVENOUS
  Administered 2020-08-30 (×3): 1.2 ug/kg/h via INTRAVENOUS
  Administered 2020-08-31 (×5): 0.8 ug/kg/h via INTRAVENOUS
  Administered 2020-09-01: 0.7 ug/kg/h via INTRAVENOUS
  Administered 2020-09-01 (×4): 0.8 ug/kg/h via INTRAVENOUS
  Administered 2020-09-01: 0.7 ug/kg/h via INTRAVENOUS
  Administered 2020-09-02 – 2020-09-03 (×11): 0.8 ug/kg/h via INTRAVENOUS
  Administered 2020-09-04: 1 ug/kg/h via INTRAVENOUS
  Administered 2020-09-04: 0.7 ug/kg/h via INTRAVENOUS
  Administered 2020-09-04: 0.8 ug/kg/h via INTRAVENOUS
  Administered 2020-09-04 (×3): 1 ug/kg/h via INTRAVENOUS
  Administered 2020-09-05: 0.4 ug/kg/h via INTRAVENOUS
  Administered 2020-09-05 (×2): 0.7 ug/kg/h via INTRAVENOUS
  Administered 2020-09-06 – 2020-09-07 (×4): 0.4 ug/kg/h via INTRAVENOUS
  Filled 2020-08-22 (×70): qty 100
  Filled 2020-08-22: qty 200
  Filled 2020-08-22 (×34): qty 100

## 2020-08-22 MED ORDER — STERILE WATER FOR INJECTION IJ SOLN
INTRAMUSCULAR | Status: AC
  Administered 2020-08-22: 10 mL
  Filled 2020-08-22: qty 20

## 2020-08-22 MED ORDER — FUROSEMIDE 10 MG/ML IJ SOLN
40.0000 mg | Freq: Once | INTRAMUSCULAR | Status: AC
  Administered 2020-08-22: 40 mg via INTRAVENOUS
  Filled 2020-08-22: qty 4

## 2020-08-22 NOTE — Progress Notes (Addendum)
Precedex started due to patient staking breaths on vent and patient not sedated adequately on max doses of fentanyl and versed drips. Tried to wean patient down to 65% Fio2 twice today- but patient would drop her saturation to mid 80's.

## 2020-08-22 NOTE — Progress Notes (Signed)
Patient proned at approximately 1445.

## 2020-08-22 NOTE — Progress Notes (Addendum)
Pt was placed in the supine position without incident. Pt ETT remains secure at 26 at the lip. SPO2 97%.  Pt has edema throughout her facial area.

## 2020-08-22 NOTE — Progress Notes (Signed)
Pharmacy Electrolyte Monitoring Consult:  Pharmacy consulted to assist in monitoring and replacing electrolytes in this 44 y.o. female admitted on 08/13/2020 with Shortness of Breath   Labs:  Sodium (mmol/L)  Date Value  08/22/2020 143  03/03/2014 140   Potassium (mmol/L)  Date Value  08/22/2020 4.5  03/03/2014 3.9   Magnesium (mg/dL)  Date Value  08/22/2020 2.3   Phosphorus (mg/dL)  Date Value  08/22/2020 2.8   Calcium (mg/dL)  Date Value  08/22/2020 9.0   Calcium, Total (mg/dL)  Date Value  03/03/2014 9.3   Albumin (g/dL)  Date Value  08/22/2020 2.9 (L)  03/03/2014 4.2    Assessment: 44 yo female here with COVID PNA  Goal: Electrolyte WNL  Plan: Sodium improving 151>146>147> 143 on free water flushes started 9/22. If Na consistently trending back up, may need to increase free water flushes. All other electrolytes stable. Continue to follow along.   Oswald Hillock, PharmD, BCPS 08/22/2020 8:44 AM

## 2020-08-22 NOTE — Progress Notes (Signed)
CRITICAL CARE NOTE 44 yo unvaccinated female admitted with COVID-19 pneumonia and ARDS   SIGNIFICANT EVENTS/STUDIES:  09/16: Pt admitted to ICU requiring Heated HFNC and NRB, however remained in the ER pending ICU bed availability  09/16: CT Head revealed no acute intracranial abnormality 09/17: Pt with worsening hypoxia requiring mechanical intubation 09/18: [facility] refused to accept pt for ECMO 09/21: Attempted to transfer pt to [facility] for possible assessment for ECMO, however both hospitals DO NOT have bed availability and are at capacity and pt may or may NOT be an ECMO candidate at this time  09/23: Pt unproned  9/24 patient proned 9/25 patient unproned   CC  follow up respiratory failure  SUBJECTIVE Patient remains critically ill Prognosis is guarded Severe ARDS  Vent Mode: PRVC FiO2 (%):  [70 %-100 %] 70 % Set Rate:  [24 bmp] 24 bmp Vt Set:  [400 mL] 400 mL PEEP:  [15 cmH20] 15 cmH20 Plateau Pressure:  [33 cmH20] 33 cmH20  CBC    Component Value Date/Time   WBC 8.7 08/21/2020 0400   RBC 3.37 (L) 08/21/2020 0400   HGB 7.6 (L) 08/21/2020 0400   HCT 26.0 (L) 08/21/2020 0400   HCT 22.0 (L) 03/15/2018 1622   PLT 296 08/21/2020 0400   MCV 77.2 (L) 08/21/2020 0400   MCH 22.6 (L) 08/21/2020 0400   MCHC 29.2 (L) 08/21/2020 0400   RDW 24.8 (H) 08/21/2020 0400   LYMPHSABS 0.3 (L) 08/21/2020 0400   MONOABS 0.3 08/21/2020 0400   EOSABS 0.0 08/21/2020 0400   BASOSABS 0.0 08/21/2020 0400   BMP Latest Ref Rng & Units 08/21/2020 08/20/2020 08/19/2020  Glucose 70 - 99 mg/dL 317(H) 251(H) 196(H)  BUN 6 - 20 mg/dL 24(H) 25(H) 16  Creatinine 0.44 - 1.00 mg/dL 0.96 0.93 0.83  Sodium 135 - 145 mmol/L 147(H) 146(H) 151(H)  Potassium 3.5 - 5.1 mmol/L 4.4 3.9 4.1  Chloride 98 - 111 mmol/L 106 108 112(H)  CO2 22 - 32 mmol/L 30 27 31   Calcium 8.9 - 10.3 mg/dL 8.5(L) 7.8(L) 7.8(L)      BP (!) 161/98   Pulse 96   Temp 98.4 F (36.9 C) (Rectal)   Resp 10   Ht 5'  9.13" (1.756 m)   Wt 127.2 kg   LMP  (LMP Unknown)   SpO2 100%   BMI 41.25 kg/m    I/O last 3 completed shifts: In: 6527.1 [I.V.:2013.8; Blood:330; NG/GT:4183.3] Out: 2150 [Urine:2150] Total I/O In: 492.3 [I.V.:397.4; IV Piggyback:94.9] Out: 350 [Urine:350]  SpO2: 100 % O2 Flow Rate (L/min): 100 L/min FiO2 (%): 70 %  Estimated body mass index is 41.25 kg/m as calculated from the following:   Height as of this encounter: 5' 9.13" (1.756 m).   Weight as of this encounter: 127.2 kg.  SIGNIFICANT EVENTS   REVIEW OF SYSTEMS  PATIENT IS UNABLE TO PROVIDE COMPLETE REVIEW OF SYSTEMS DUE TO SEVERE CRITICAL ILLNESS      COVID-19 DISASTER DECLARATION:   FULL CONTACT PHYSICAL EXAMINATION WAS NOT POSSIBLE DUE TO TREATMENT OF COVID-19  AND CONSERVATION OF PERSONAL PROTECTIVE EQUIPMENT, LIMITED EXAM FINDINGS INCLUDE-   PHYSICAL EXAMINATION:  GENERAL:critically ill appearing, +resp distress NEUROLOGIC: obtunded, GCS<8   Patient assessed or the symptoms described in the history of present illness.  In the context of the Global COVID-19 pandemic, which necessitated consideration that the patient might be at risk for infection with the SARS-CoV-2 virus that causes COVID-19, Institutional protocols and algorithms that pertain to the evaluation  of patients at risk for COVID-19 are in a state of rapid change based on information released by regulatory bodies including the CDC and federal and state organizations. These policies and algorithms were followed during the patient's care while in hospital.    MEDICATIONS: I have reviewed all medications and confirmed regimen as documented   CULTURE RESULTS   Recent Results (from the past 240 hour(s))  Culture, blood (routine x 2)     Status: None   Collection Time: 08/13/20  6:06 PM   Specimen: BLOOD  Result Value Ref Range Status   Specimen Description BLOOD RIGHT ANTECUBITAL  Final   Special Requests   Final    BOTTLES DRAWN  AEROBIC AND ANAEROBIC Blood Culture adequate volume   Culture   Final    NO GROWTH 5 DAYS Performed at [facility], [address], [place], [place] [address]    Report Status 08/18/2020 FINAL  Final  Culture, blood (routine x 2)     Status: None   Collection Time: 08/13/20  6:06 PM   Specimen: BLOOD  Result Value Ref Range Status   Specimen Description BLOOD LEFT ANTECUBITAL  Final   Special Requests   Final    BOTTLES DRAWN AEROBIC AND ANAEROBIC Blood Culture adequate volume   Culture   Final    NO GROWTH 5 DAYS Performed at [facility], [address] [place], [place] [address]    Report Status 08/18/2020 FINAL  Final  MRSA PCR Screening     Status: None   Collection Time: 08/13/20 11:34 PM   Specimen: Nasal Mucosa; Nasopharyngeal  Result Value Ref Range Status   MRSA by PCR NEGATIVE NEGATIVE Final    Comment:        The GeneXpert MRSA Assay (FDA approved for NASAL specimens only), is one component of a comprehensive MRSA colonization surveillance program. It is not intended to diagnose MRSA infection nor to guide or monitor treatment for MRSA infections. Performed at [facility], [address] [place], [place] [address]         Anti-infectives (From admission, onward)   Start     Dose/Rate Route Frequency Ordered Stop   08/21/20 2200  metroNIDAZOLE (FLAGYL) IVPB 500 mg        500 mg 100 mL/hr over 60 Minutes Intravenous Every 8 hours 08/21/20 2102 08/26/20 2159   08/14/20 1000  remdesivir 100 mg in sodium chloride 0.9 % 100 mL IVPB       "Followed by" Linked Group Details   100 mg 200 mL/hr over 30 Minutes Intravenous Daily 08/13/20 2040 08/17/20 1034   08/13/20 2230  azithromycin (ZITHROMAX) 500 mg in sodium chloride 0.9 % 250 mL IVPB  Status:  Discontinued        500 mg 250 mL/hr over 60 Minutes Intravenous Every 24 hours 08/13/20 2102 08/17/20 1021   08/13/20 2200  cefTRIAXone (ROCEPHIN) 2 g in sodium chloride 0.9 % 100 mL  IVPB  Status:  Discontinued        2 g 200 mL/hr over 30 Minutes Intravenous Every 24 hours 08/13/20 2102 08/17/20 1021   08/13/20 2045  remdesivir 200 mg in sodium chloride 0.9% 250 mL IVPB       "Followed by" Linked Group Details   200 mg 580 mL/hr over 30 Minutes Intravenous Once 08/13/20 2040 08/14/20 0216        IMAGING    No results found.   Nutrition Status: Nutrition Problem: Inadequate oral intake Etiology:  inability to eat Signs/Symptoms: NPO status Interventions: Tube feeding     Indwelling Urinary Catheter continued, requirement due to   Reason to continue Indwelling Urinary Catheter strict Intake/Output monitoring for hemodynamic instability   Central Line/ continued, requirement due to  Reason to continue Piru of central venous pressure or other hemodynamic parameters and poor IV access   Ventilator continued, requirement due to severe respiratory failure   Ventilator Sedation RASS 0 to -2      ASSESSMENT AND PLAN SYNOPSIS  Acute hypoxemic respiratory failure due to COVID-19 pneumonia / ARDS Mechanical ventilation via ARDS protocol, target PRVC 6 cc/kg Wean PEEP and FiO2 as able Goal plateau pressure less than 30, driving pressure less than 15 Paralytics if necessary for vent synchrony, gas exchange Cycle prone positioning if necessary for oxygenation Deep sedation per PAD protocol, goal RASS -4, currently fentanyl, midazolam Diuresis as blood pressure and renal function can tolerate, goal CVP 5-8.   diuresis as tolerated based on Kidney function VAP prevention order set IV STEROIDS    Severe ACUTE Hypoxic and Hypercapnic Respiratory Failure -continue Full MV support -continue Bronchodilator Therapy -Wean Fio2 and PEEP as tolerated -will perform SAT/SBT when respiratory parameters are met -VAP/VENT bundle implementation   Morbid obesity, possible OSA.   Will certainly impact respiratory mechanics, ventilator  weaning Suspect will need to consider additional PEEP    NEUROLOGY Acute toxic metabolic encephalopathy, need for sedation Goal RASS -2 to -3  CARDIAC ICU monitoring  ID see ABX list above -continue IV abx as prescibed -follow up cultures  GI GI PROPHYLAXIS as indicated   DIET-->TF's as tolerated Constipation protocol as indicated  ENDO - will use ICU hypoglycemic\Hyperglycemia protocol if indicated     ELECTROLYTES -follow labs as needed -replace as needed -pharmacy consultation and following   DVT/GI PRX ordered and assessed TRANSFUSIONS AS NEEDED MONITOR FSBS I Assessed the need for Labs I Assessed the need for Foley I Assessed the need for Central Venous Line Family Discussion when available I Assessed the need for Mobilization I made an Assessment of medications to be adjusted accordingly Safety Risk assessment completed   Critical Care Time devoted to patient care services described in this note is 69mnutes.   Overall, patient is critically ill, prognosis is guarded.  P high risk for cardiac arrest and death.    KCorrin Parker M.D.  LVelora HecklerPulmonary & Critical Care Medicine  Medical Director IAnchorageDirector AClearwater Ambulatory Surgical Centers IncCardio-Pulmonary Department

## 2020-08-23 LAB — CBC WITH DIFFERENTIAL/PLATELET
Abs Immature Granulocytes: 0.25 10*3/uL — ABNORMAL HIGH (ref 0.00–0.07)
Basophils Absolute: 0 10*3/uL (ref 0.0–0.1)
Basophils Relative: 0 %
Eosinophils Absolute: 0 10*3/uL (ref 0.0–0.5)
Eosinophils Relative: 0 %
HCT: 26.6 % — ABNORMAL LOW (ref 36.0–46.0)
Hemoglobin: 7.9 g/dL — ABNORMAL LOW (ref 12.0–15.0)
Immature Granulocytes: 2 %
Lymphocytes Relative: 3 %
Lymphs Abs: 0.4 10*3/uL — ABNORMAL LOW (ref 0.7–4.0)
MCH: 22.7 pg — ABNORMAL LOW (ref 26.0–34.0)
MCHC: 29.7 g/dL — ABNORMAL LOW (ref 30.0–36.0)
MCV: 76.4 fL — ABNORMAL LOW (ref 80.0–100.0)
Monocytes Absolute: 0.1 10*3/uL (ref 0.1–1.0)
Monocytes Relative: 1 %
Neutro Abs: 10.3 10*3/uL — ABNORMAL HIGH (ref 1.7–7.7)
Neutrophils Relative %: 94 %
Platelets: 290 10*3/uL (ref 150–400)
RBC: 3.48 MIL/uL — ABNORMAL LOW (ref 3.87–5.11)
RDW: 25.1 % — ABNORMAL HIGH (ref 11.5–15.5)
WBC: 11.1 10*3/uL — ABNORMAL HIGH (ref 4.0–10.5)
nRBC: 0.2 % (ref 0.0–0.2)

## 2020-08-23 LAB — PHOSPHORUS: Phosphorus: 3.9 mg/dL (ref 2.5–4.6)

## 2020-08-23 LAB — GLUCOSE, CAPILLARY
Glucose-Capillary: 213 mg/dL — ABNORMAL HIGH (ref 70–99)
Glucose-Capillary: 172 mg/dL — ABNORMAL HIGH (ref 70–99)
Glucose-Capillary: 153 mg/dL — ABNORMAL HIGH (ref 70–99)
Glucose-Capillary: 174 mg/dL — ABNORMAL HIGH (ref 70–99)
Glucose-Capillary: 195 mg/dL — ABNORMAL HIGH (ref 70–99)

## 2020-08-23 LAB — COMPREHENSIVE METABOLIC PANEL
ALT: 33 U/L (ref 0–44)
AST: 28 U/L (ref 15–41)
Albumin: 2.8 g/dL — ABNORMAL LOW (ref 3.5–5.0)
Alkaline Phosphatase: 78 U/L (ref 38–126)
Anion gap: 11 (ref 5–15)
BUN: 22 mg/dL — ABNORMAL HIGH (ref 6–20)
CO2: 30 mmol/L (ref 22–32)
Calcium: 9.1 mg/dL (ref 8.9–10.3)
Chloride: 100 mmol/L (ref 98–111)
Creatinine, Ser: 0.83 mg/dL (ref 0.44–1.00)
GFR calc Af Amer: >60 mL/min (ref >60)
GFR calc non Af Amer: >60 mL/min (ref >60)
Glucose, Bld: 214 mg/dL — ABNORMAL HIGH (ref 70–99)
Potassium: 4.2 mmol/L (ref 3.5–5.1)
Sodium: 141 mmol/L (ref 135–145)
Total Bilirubin: 0.5 mg/dL (ref 0.3–1.2)
Total Protein: 6.6 g/dL (ref 6.5–8.1)

## 2020-08-23 LAB — MAGNESIUM: Magnesium: 2.1 mg/dL (ref 1.7–2.4)

## 2020-08-23 MED ORDER — FUROSEMIDE 10 MG/ML IJ SOLN
20.0000 mg | Freq: Once | INTRAMUSCULAR | Status: AC
  Administered 2020-08-23: 20 mg via INTRAVENOUS
  Filled 2020-08-23: qty 2

## 2020-08-23 MED ORDER — FENTANYL BOLUS VIA INFUSION
50.0000 ug | Freq: Once | INTRAVENOUS | Status: AC
  Administered 2020-08-23: 50 ug via INTRAVENOUS
  Filled 2020-08-23: qty 50

## 2020-08-23 NOTE — Progress Notes (Signed)
Patient tolerating 55% Fi02 on vent- no proning per Dr. Patsey Berthold.

## 2020-08-23 NOTE — Progress Notes (Signed)
Name: Kathy Vega MRN: [id number] DOB: 06-Mar-1976    ADMISSION DATE:  08/13/2020  BRIEF PATIENT DESCRIPTION: 44 yo unvaccinated female admitted with COVID-19 pneumonia and ARDS   SIGNIFICANT EVENTS/STUDIES:  09/16: Pt admitted to ICU requiring Heated HFNC and NRB, however remained in the ER pending ICU bed availability  09/16: CT Head revealed no acute intracranial abnormality 09/17: Pt with worsening hypoxia requiring mechanical intubation 09/18: [facility] refused to accept pt for ECMO 09/21: Attempted to transfer pt to [facility] and [place] [facility] for possible assessment for ECMO, however both hospitals DO NOT have bed availability and are at capacity and pt may or may NOT be an ECMO candidate at this time  09/23: Pt unproned  09/24:  Resumed proning protocol 09/25: Resumed proning protocol  09/26: Pt unproned   PAST MEDICAL HISTORY :   has a past medical history of Anemia and Thyroid disease.  has a past surgical history that includes Leg Surgery and Cholecystectomy. Prior to Admission medications   Medication Sig Start Date End Date Taking? Authorizing Provider  cyclobenzaprine (FLEXERIL) 10 MG tablet Take 1 tablet (10 mg total) by mouth 3 (three) times daily as needed. 03/26/20   Fisher, Linden Dolin, PA-C  ferrous gluconate (FERGON) 324 MG tablet Take 1 tablet (324 mg total) by mouth 2 (two) times daily with a meal. 03/15/18   [name], MD  ferrous sulfate 325 (65 FE) MG EC tablet Take 325 mg by mouth daily.    [provider]  levothyroxine (SYNTHROID, LEVOTHROID) 125 MCG tablet Take 125 mcg by mouth daily before breakfast.    [provider]  traMADol (ULTRAM) 50 MG tablet Take 1 tablet (50 mg total) by mouth every 6 (six) hours as needed. 03/26/20   Caryn Section Linden Dolin, PA-C   No Known Allergies  FAMILY HISTORY:  family history is not on file. SOCIAL HISTORY:  reports that she has never smoked. She has never used smokeless tobacco. She reports that she does not drink  alcohol.  REVIEW OF SYSTEMS:  Unable to assess pt mechanically intubated  SUBJECTIVE:  Unable to assess pt mechanically intubated   VITAL SIGNS: Temp:  [95.5 F (35.3 C)-99.1 F (37.3 C)] 95.5 F (35.3 C) (09/26 0000) Pulse Rate:  [69-109] 71 (09/26 0000) Resp:  [0-30] 22 (09/26 0000) BP: (110-165)/(62-99) 138/94 (09/26 0000) SpO2:  [87 %-100 %] 94 % (09/26 0205) FiO2 (%):  [65 %-70 %] 70 % (09/26 0205) Weight:  [124.1 kg] 124.1 kg (09/26 0418)  PHYSICAL EXAMINATION: Physical examination is limited due to need for PPE/CAPR General: acutely ill appearing female, NAD mechanically ventilated, patient supine Neuro: sedated, PERRL HEENT: supple  Cardiovascular: Monitor shows sinus rhythm  Lungs: diminished breath sounds throughout.  No wheezes.  Abdomen: hypoactive BS x4, obese, soft, non distended  Musculoskeletal: 1+ generalized edema  Skin: intact no rashes or lesions present   Recent Labs  Lab 08/20/20 0400 08/21/20 0400 08/22/20 0617  NA 146* 147* 143  K 3.9 4.4 4.5  CL 108 106 104  CO2 _0 BUN 25* 24* 18  CREATININE 0.93 0.96 0.67  GLUCOSE 251* 317* 224*   Recent Labs  Lab 08/20/20 0400 08/21/20 0400 08/22/20 0617  HGB 6.6* 7.6* 8.5*  HCT 21.8* 26.0* 27.6*  WBC 9.4 8.7 9.1  PLT 241 296 285   DG Chest Port 1 View  Result Date: 08/22/2020 CLINICAL DATA:  Acute respiratory failure EXAM: PORTABLE CHEST 1 VIEW COMPARISON:  08/20/2020 prior chest  radiographs FINDINGS: An endotracheal tube and NG tube are again noted. Bilateral airspace opacities are again noted with increasing airspace disease/consolidation within the RIGHT UPPER lung. There is no evidence of pneumothorax. No other significant changes are present. IMPRESSION: Increasing RIGHT UPPER lung airspace disease/consolidation. No other significant change. Electronically Signed   By: Margarette Canada M.D.   On: 08/22/2020 08:01    ASSESSMENT / PLAN:  Acute hypoxic respiratory failure secondary to  COVID-19 pneumonia and ARDS Morbid Obesity, possible OSA  Full vent support for now-vent settings reviewed and established VAP bundle implemented SBT once all parameters met  Will keep net negative  Continue IV steroids Continue BARIC Completed course of remdesivir, azithromycin, and ceftriaxone  Maintain airborne and contact precautions  As needed bronchodilators (MDI) Vitamin C and zinc Antitussives Trend inflammatory markers Trend WBC and monitor fever curve Trend PCT   Troponin elevation due to demand ischemia Hypertrophic cardiomyopathy Continuous telemetry monitoring  Echo LVEF 45 to 50% Severe concentric LVH Diastolic dysfunction No pressor requirement Diuresis as needed  Mild acute renal failure  Hypokalemia  Hypernatremia  Trend BMP  Replace electrolytes as indicated  Monitor UOP Avoid nephrotoxic medications   Elevated liver enzymes  Trend hepatic panel   Anemia without obvious acute blood loss Trend CBC Monitor for s/sx of bleeding  Transfuse for hgb <7 Received transfusions 9/23  Hyperglycemia  CBG's q4hrs  SSI   Acute encephalopathy secondary to COVID-19  Mechanical ventilation pain/discomfort Maintain RASS goal -1 to -2 Continue versed, precedex, and fentanyl gtts to maintain RASS goal PRN vecuronium for vent dyssynchrony   WUA daily as tolerated   Best Practice: VTE px: subq heparin and SCD's Diet: continue TF's  Code status: Full code  Disposition: ICU Family: No family at bedside to update at this time  Marda Stalker, [place] Pager [phone] (please enter 7 digits) PCCM Consult Pager [phone] (please enter 7 digits)

## 2020-08-23 NOTE — Progress Notes (Signed)
Pharmacy Electrolyte Monitoring Consult:  Pharmacy consulted to assist in monitoring and replacing electrolytes in this 44 y.o. female admitted on 08/13/2020 with Shortness of Breath   Labs:  Sodium (mmol/L)  Date Value  08/23/2020 141  03/03/2014 140   Potassium (mmol/L)  Date Value  08/23/2020 4.2  03/03/2014 3.9   Magnesium (mg/dL)  Date Value  08/23/2020 2.1   Phosphorus (mg/dL)  Date Value  08/23/2020 3.9   Calcium (mg/dL)  Date Value  08/23/2020 9.1   Calcium, Total (mg/dL)  Date Value  03/03/2014 9.3   Albumin (g/dL)  Date Value  08/23/2020 2.8 (L)  03/03/2014 4.2    Assessment: 44 yo female here with COVID PNA  Goal: Electrolyte WNL  Plan: Sodium improving 147> 143 > 141 on free water flushes started 9/22. If Na consistently trending back up, may need to increase free water flushes. All other electrolytes stable. Continue to follow along.   Oswald Hillock, PharmD, BCPS 08/23/2020 8:50 AM

## 2020-08-24 LAB — CBC WITH DIFFERENTIAL/PLATELET
Abs Immature Granulocytes: 0.27 10*3/uL — ABNORMAL HIGH (ref 0.00–0.07)
Basophils Absolute: 0 10*3/uL (ref 0.0–0.1)
Basophils Relative: 0 %
Eosinophils Absolute: 0 10*3/uL (ref 0.0–0.5)
Eosinophils Relative: 0 %
HCT: 19.9 % — ABNORMAL LOW (ref 36.0–46.0)
Hemoglobin: 6 g/dL — ABNORMAL LOW (ref 12.0–15.0)
Immature Granulocytes: 4 %
Lymphocytes Relative: 11 %
Lymphs Abs: 0.8 10*3/uL (ref 0.7–4.0)
MCH: 22.8 pg — ABNORMAL LOW (ref 26.0–34.0)
MCHC: 30.2 g/dL (ref 30.0–36.0)
MCV: 75.7 fL — ABNORMAL LOW (ref 80.0–100.0)
Monocytes Absolute: 0.2 10*3/uL (ref 0.1–1.0)
Monocytes Relative: 2 %
Neutro Abs: 6.1 10*3/uL (ref 1.7–7.7)
Neutrophils Relative %: 83 %
Platelets: 225 10*3/uL (ref 150–400)
RBC: 2.63 MIL/uL — ABNORMAL LOW (ref 3.87–5.11)
RDW: 25.1 % — ABNORMAL HIGH (ref 11.5–15.5)
Smear Review: NORMAL
WBC: 7.4 10*3/uL (ref 4.0–10.5)
nRBC: 0.3 % — ABNORMAL HIGH (ref 0.0–0.2)

## 2020-08-24 LAB — COMPREHENSIVE METABOLIC PANEL
ALT: 26 U/L (ref 0–44)
AST: 22 U/L (ref 15–41)
Albumin: 2.3 g/dL — ABNORMAL LOW (ref 3.5–5.0)
Alkaline Phosphatase: 65 U/L (ref 38–126)
Anion gap: 9 (ref 5–15)
BUN: 25 mg/dL — ABNORMAL HIGH (ref 6–20)
CO2: 25 mmol/L (ref 22–32)
Calcium: 7.9 mg/dL — ABNORMAL LOW (ref 8.9–10.3)
Chloride: 104 mmol/L (ref 98–111)
Creatinine, Ser: 0.7 mg/dL (ref 0.44–1.00)
GFR calc Af Amer: >60 mL/min (ref >60)
GFR calc non Af Amer: >60 mL/min (ref >60)
Glucose, Bld: 162 mg/dL — ABNORMAL HIGH (ref 70–99)
Potassium: 3.3 mmol/L — ABNORMAL LOW (ref 3.5–5.1)
Sodium: 138 mmol/L (ref 135–145)
Total Bilirubin: 0.4 mg/dL (ref 0.3–1.2)
Total Protein: 5.5 g/dL — ABNORMAL LOW (ref 6.5–8.1)

## 2020-08-24 LAB — GLUCOSE, CAPILLARY
Glucose-Capillary: 116 mg/dL — ABNORMAL HIGH (ref 70–99)
Glucose-Capillary: 133 mg/dL — ABNORMAL HIGH (ref 70–99)
Glucose-Capillary: 136 mg/dL — ABNORMAL HIGH (ref 70–99)
Glucose-Capillary: 151 mg/dL — ABNORMAL HIGH (ref 70–99)
Glucose-Capillary: 159 mg/dL — ABNORMAL HIGH (ref 70–99)
Glucose-Capillary: 189 mg/dL — ABNORMAL HIGH (ref 70–99)
Glucose-Capillary: 207 mg/dL — ABNORMAL HIGH (ref 70–99)

## 2020-08-24 LAB — PHOSPHORUS: Phosphorus: 3.7 mg/dL (ref 2.5–4.6)

## 2020-08-24 LAB — CBC
HCT: 22.6 % — ABNORMAL LOW (ref 36.0–46.0)
Hemoglobin: 6.8 g/dL — ABNORMAL LOW (ref 12.0–15.0)
MCH: 22.7 pg — ABNORMAL LOW (ref 26.0–34.0)
MCHC: 30.1 g/dL (ref 30.0–36.0)
MCV: 75.3 fL — ABNORMAL LOW (ref 80.0–100.0)
Platelets: 262 10*3/uL (ref 150–400)
RBC: 3 MIL/uL — ABNORMAL LOW (ref 3.87–5.11)
RDW: 24.9 % — ABNORMAL HIGH (ref 11.5–15.5)
WBC: 7.7 10*3/uL (ref 4.0–10.5)
nRBC: 0.5 % — ABNORMAL HIGH (ref 0.0–0.2)

## 2020-08-24 LAB — MAGNESIUM: Magnesium: 1.8 mg/dL (ref 1.7–2.4)

## 2020-08-24 MED ORDER — SENNOSIDES-DOCUSATE SODIUM 8.6-50 MG PO TABS
1.0000 | ORAL_TABLET | Freq: Two times a day (BID) | ORAL | Status: DC
  Administered 2020-08-24 – 2020-09-03 (×16): 1 via ORAL
  Filled 2020-08-24 (×17): qty 1

## 2020-08-24 MED ORDER — SODIUM CHLORIDE 0.9 % IV SOLN
0.0000 ug/h | INTRAVENOUS | Status: DC
  Administered 2020-08-24: 400 ug/h via INTRAVENOUS
  Filled 2020-08-24: qty 50

## 2020-08-24 MED ORDER — FENTANYL 2500MCG IN NS 250ML (10MCG/ML) PREMIX INFUSION
0.0000 ug/h | INTRAVENOUS | Status: DC
  Administered 2020-08-24 – 2020-08-28 (×17): 400 ug/h via INTRAVENOUS
  Administered 2020-08-29: 200 ug/h via INTRAVENOUS
  Administered 2020-08-29 – 2020-08-30 (×4): 400 ug/h via INTRAVENOUS
  Administered 2020-08-30: 350 ug/h via INTRAVENOUS
  Administered 2020-08-30 – 2020-08-31 (×2): 400 ug/h via INTRAVENOUS
  Filled 2020-08-24 (×24): qty 250

## 2020-08-24 NOTE — Progress Notes (Signed)
Name: Kathy Vega MRN: [id number] DOB: [birth year]    ADMISSION DATE:  08/13/2020  BRIEF PATIENT DESCRIPTION: 44 yo unvaccinated female admitted with COVID-19 pneumonia and ARDS   SIGNIFICANT EVENTS/STUDIES:  09/16: Pt admitted to ICU requiring Heated HFNC and NRB, however remained in the ER pending ICU bed availability  09/16: CT Head revealed no acute intracranial abnormality 09/17: Pt with worsening hypoxia requiring mechanical intubation 09/18: [facility] refused to accept pt for ECMO 09/21: Attempted to transfer pt to [facility] and [place] hospital for possible assessment for ECMO, however both hospitals DO NOT have bed availability and are at capacity and pt may or may NOT be an ECMO candidate at this time  09/23: Pt unproned  09/24:  Resumed proning protocol 09/25: Resumed proning protocol  09/26: Pt unproned 9/27-  No onvernight events, patient remains in ARDS on elevated settings.   PAST MEDICAL HISTORY :   has a past medical history of Anemia and Thyroid disease.  has a past surgical history that includes Leg Surgery and Cholecystectomy. Prior to Admission medications   Medication Sig Start Date End Date Taking? Authorizing Provider  cyclobenzaprine (FLEXERIL) 10 MG tablet Take 1 tablet (10 mg total) by mouth 3 (three) times daily as needed. 03/26/20   Fisher, Linden Dolin, PA-C  ferrous gluconate (FERGON) 324 MG tablet Take 1 tablet (324 mg total) by mouth 2 (two) times daily with a meal. 03/15/18   [name], MD  ferrous sulfate 325 (65 FE) MG EC tablet Take 325 mg by mouth daily.    [provider]  levothyroxine (SYNTHROID, LEVOTHROID) 125 MCG tablet Take 125 mcg by mouth daily before breakfast.    [provider]  traMADol (ULTRAM) 50 MG tablet Take 1 tablet (50 mg total) by mouth every 6 (six) hours as needed. 03/26/20   Caryn Section Linden Dolin, PA-C   No Known Allergies  FAMILY HISTORY:  family history is not on file. SOCIAL HISTORY:  reports that she has never  smoked. She has never used smokeless tobacco. She reports that she does not drink alcohol.  REVIEW OF SYSTEMS:  Unable to assess pt mechanically intubated  SUBJECTIVE:  Unable to assess pt mechanically intubated   VITAL SIGNS: Temp:  [95.9 F (35.5 C)-99.9 F (37.7 C)] 95.9 F (35.5 C) (09/27 1100) Pulse Rate:  [63-84] 63 (09/27 1100) Resp:  [27-30] 30 (09/27 1100) BP: (113-154)/(69-90) 124/79 (09/27 1100) SpO2:  [90 %-96 %] 91 % (09/27 1100) FiO2 (%):  [40 %-55 %] 40 % (09/27 0900) Weight:  [127.3 kg] 127.3 kg (09/27 0500)  PHYSICAL EXAMINATION: Physical examination is limited due to need for PPE/CAPR General: acutely ill appearing female, NAD mechanically ventilated, patient supine Neuro: sedated, PERRL HEENT: supple  Cardiovascular: Monitor shows sinus rhythm  Lungs: diminished breath sounds throughout.  No wheezes.  Abdomen: hypoactive BS x4, obese, soft, non distended  Musculoskeletal: 1+ generalized edema  Skin: intact no rashes or lesions present   Recent Labs  Lab 08/21/20 0400 08/22/20 0617 08/23/20 0440  NA 147* 143 141  K 4.4 4.5 4.2  CL 106 104 100  CO2 30 31 30   BUN 24* 18 22*  CREATININE 0.96 0.67 0.83  GLUCOSE 317* 224* 214*   Recent Labs  Lab 08/21/20 0400 08/22/20 0617 08/23/20 0440  HGB 7.6* 8.5* 7.9*  HCT 26.0* 27.6* 26.6*  WBC 8.7 9.1 11.1*  PLT 296 285 290   No results found.  ASSESSMENT / PLAN:  Acute hypoxic respiratory failure secondary  to COVID-19 pneumonia and ARDS Morbid Obesity, possible OSA  Full vent support for now-vent settings reviewed and established VAP bundle implemented SBT once all parameters met  Will keep net negative  Continue IV steroids Continue BARIC Completed course of remdesivir, azithromycin, and ceftriaxone  Maintain airborne and contact precautions  As needed bronchodilators (MDI) Vitamin C and zinc Antitussives Trend inflammatory markers Trend WBC and monitor fever curve Trend PCT   Troponin  elevation due to demand ischemia Hypertrophic cardiomyopathy Continuous telemetry monitoring  Echo LVEF 45 to 50% Severe concentric LVH Diastolic dysfunction No pressor requirement Diuresis as needed  Mild acute renal failure  Hypokalemia  Hypernatremia  Trend BMP  Replace electrolytes as indicated  Monitor UOP Avoid nephrotoxic medications   Elevated liver enzymes  Trend hepatic panel   Anemia without obvious acute blood loss Trend CBC Monitor for s/sx of bleeding  Transfuse for hgb <7 Received transfusions 9/23  Hyperglycemia  CBG's q4hrs  SSI   Acute encephalopathy secondary to COVID-19  Mechanical ventilation pain/discomfort Maintain RASS goal -1 to -2 Continue versed, precedex, and fentanyl gtts to maintain RASS goal PRN vecuronium for vent dyssynchrony   WUA daily as tolerated   Best Practice: VTE px: subq heparin and SCD's Diet: continue TF's  Code status: Full code  Disposition: ICU Family: No family at bedside to update at this time    Critical care provider statement:    Critical care time (minutes):  33   Critical care time was exclusive of:  Separately billable procedures and  treating other patients   Critical care was necessary to treat or prevent imminent or  life-threatening deterioration of the following conditions:  acute hypoxemic respiratory failure, multiple comorbid conditions   Critical care was time spent personally by me on the following  activities:  Development of treatment plan with patient or surrogate,  discussions with consultants, evaluation of patient's response to  treatment, examination of patient, obtaining history from patient or  surrogate, ordering and performing treatments and interventions, ordering  and review of laboratory studies and re-evaluation of patient's condition   I assumed direction of critical care for this patient from another  provider in my specialty: no      Ottie Glazier, M.D.  Pulmonary &  [place]

## 2020-08-24 NOTE — Progress Notes (Signed)
Pharmacy Electrolyte Monitoring Consult:  Labs:  Sodium (mmol/L)  Date Value  08/23/2020 141  03/03/2014 140   Potassium (mmol/L)  Date Value  08/23/2020 4.2  03/03/2014 3.9   Magnesium (mg/dL)  Date Value  08/23/2020 2.1   Phosphorus (mg/dL)  Date Value  08/23/2020 3.9   Calcium (mg/dL)  Date Value  08/23/2020 9.1   Calcium, Total (mg/dL)  Date Value  03/03/2014 9.3   Albumin (g/dL)  Date Value  08/23/2020 2.8 (L)  03/03/2014 4.2    Assessment: Pharmacy has been consulted to assist in monitoring and replacement of electrolytes in this 44 yo female presenting with SOB on on 08/13/2020 found to have COVID PNA. Pt intubated on 9/17 and tube feeds started on 9/23.  9/26 Sodium improving 147> 143 > 141 on free water flushes started 9/22. If Na consistently trending back up, may need to increase free water flushes  9/27 Sodium continues to improve on free water flushes 200 mL q6h and diuresis as needed (last lasix dose 20 mg x1 on 9/26). Scr stable <1 with good UOP and unknown last bowel movement.   Goal: Electrolytes WNL  Plan: -No additional replacement needed at this time -Recheck electrolytes with AM labs.   Jacobo Forest PharmD Candidate 2022 08/24/2020 1:10 PM

## 2020-08-25 ENCOUNTER — Inpatient Hospital Stay: Payer: Medicaid Other

## 2020-08-25 LAB — CBC WITH DIFFERENTIAL/PLATELET
Abs Immature Granulocytes: 0.28 10*3/uL — ABNORMAL HIGH (ref 0.00–0.07)
Basophils Absolute: 0 10*3/uL (ref 0.0–0.1)
Basophils Relative: 0 %
Eosinophils Absolute: 0 10*3/uL (ref 0.0–0.5)
Eosinophils Relative: 0 %
HCT: 24.7 % — ABNORMAL LOW (ref 36.0–46.0)
Hemoglobin: 7.7 g/dL — ABNORMAL LOW (ref 12.0–15.0)
Immature Granulocytes: 3 %
Lymphocytes Relative: 9 %
Lymphs Abs: 1 10*3/uL (ref 0.7–4.0)
MCH: 23 pg — ABNORMAL LOW (ref 26.0–34.0)
MCHC: 31.2 g/dL (ref 30.0–36.0)
MCV: 73.7 fL — ABNORMAL LOW (ref 80.0–100.0)
Monocytes Absolute: 0.3 10*3/uL (ref 0.1–1.0)
Monocytes Relative: 2 %
Neutro Abs: 9.7 10*3/uL — ABNORMAL HIGH (ref 1.7–7.7)
Neutrophils Relative %: 86 %
Platelets: 302 10*3/uL (ref 150–400)
RBC: 3.35 MIL/uL — ABNORMAL LOW (ref 3.87–5.11)
RDW: 25.2 % — ABNORMAL HIGH (ref 11.5–15.5)
Smear Review: NORMAL
WBC: 11.2 10*3/uL — ABNORMAL HIGH (ref 4.0–10.5)
nRBC: 0.4 % — ABNORMAL HIGH (ref 0.0–0.2)

## 2020-08-25 LAB — GLUCOSE, CAPILLARY
Glucose-Capillary: 118 mg/dL — ABNORMAL HIGH (ref 70–99)
Glucose-Capillary: 121 mg/dL — ABNORMAL HIGH (ref 70–99)
Glucose-Capillary: 126 mg/dL — ABNORMAL HIGH (ref 70–99)
Glucose-Capillary: 140 mg/dL — ABNORMAL HIGH (ref 70–99)
Glucose-Capillary: 148 mg/dL — ABNORMAL HIGH (ref 70–99)
Glucose-Capillary: 151 mg/dL — ABNORMAL HIGH (ref 70–99)
Glucose-Capillary: 153 mg/dL — ABNORMAL HIGH (ref 70–99)

## 2020-08-25 LAB — COMPREHENSIVE METABOLIC PANEL
ALT: 23 U/L (ref 0–44)
AST: 20 U/L (ref 15–41)
Albumin: 2.3 g/dL — ABNORMAL LOW (ref 3.5–5.0)
Alkaline Phosphatase: 65 U/L (ref 38–126)
Anion gap: 7 (ref 5–15)
BUN: 26 mg/dL — ABNORMAL HIGH (ref 6–20)
CO2: 25 mmol/L (ref 22–32)
Calcium: 7.6 mg/dL — ABNORMAL LOW (ref 8.9–10.3)
Chloride: 104 mmol/L (ref 98–111)
Creatinine, Ser: 0.81 mg/dL (ref 0.44–1.00)
GFR calc Af Amer: >60 mL/min (ref >60)
GFR calc non Af Amer: >60 mL/min (ref >60)
Glucose, Bld: 140 mg/dL — ABNORMAL HIGH (ref 70–99)
Potassium: 3.3 mmol/L — ABNORMAL LOW (ref 3.5–5.1)
Sodium: 136 mmol/L (ref 135–145)
Total Bilirubin: 0.5 mg/dL (ref 0.3–1.2)
Total Protein: 5.2 g/dL — ABNORMAL LOW (ref 6.5–8.1)

## 2020-08-25 LAB — BLOOD GAS, VENOUS
Acid-Base Excess: 6.5 mmol/L — ABNORMAL HIGH (ref 0.0–2.0)
Bicarbonate: 32.8 mmol/L — ABNORMAL HIGH (ref 20.0–28.0)
FIO2: 1
O2 Saturation: 37.1 %
Patient temperature: 39
pCO2, Ven: 58 mmHg (ref 44.0–60.0)
pH, Ven: 7.33 (ref 7.250–7.430)

## 2020-08-25 LAB — MAGNESIUM: Magnesium: 2 mg/dL (ref 1.7–2.4)

## 2020-08-25 LAB — PHOSPHORUS: Phosphorus: 3.7 mg/dL (ref 2.5–4.6)

## 2020-08-25 MED ORDER — LORAZEPAM 2 MG/ML IJ SOLN
INTRAMUSCULAR | Status: AC
  Filled 2020-08-25: qty 2

## 2020-08-25 MED ORDER — LORAZEPAM 2 MG/ML IJ SOLN
4.0000 mg | Freq: Once | INTRAMUSCULAR | Status: AC
  Administered 2020-08-25: 4 mg via INTRAVENOUS

## 2020-08-25 MED ORDER — POTASSIUM CHLORIDE 20 MEQ PO PACK
40.0000 meq | PACK | Freq: Once | ORAL | Status: AC
  Administered 2020-08-25: 40 meq
  Filled 2020-08-25: qty 2

## 2020-08-25 MED ORDER — LORAZEPAM 2 MG/ML IJ SOLN
4.0000 mg | INTRAMUSCULAR | Status: DC | PRN
  Administered 2020-08-27 – 2020-08-28 (×2): 4 mg via INTRAVENOUS
  Filled 2020-08-25 (×2): qty 2

## 2020-08-25 MED ORDER — FUROSEMIDE 10 MG/ML IJ SOLN
20.0000 mg | Freq: Two times a day (BID) | INTRAMUSCULAR | Status: DC
  Administered 2020-08-25 – 2020-09-10 (×30): 20 mg via INTRAVENOUS
  Filled 2020-08-25 (×31): qty 2

## 2020-08-25 NOTE — Progress Notes (Signed)
Name: Kathy Vega MRN: [id number] DOB: 1976-02-01    ADMISSION DATE:  08/13/2020  BRIEF PATIENT DESCRIPTION: 44 yo unvaccinated female admitted with COVID-19 pneumonia and ARDS   SIGNIFICANT EVENTS/STUDIES:  09/16: Pt admitted to ICU requiring Heated HFNC and NRB, however remained in the ER pending ICU bed availability  09/16: CT Head revealed no acute intracranial abnormality 09/17: Pt with worsening hypoxia requiring mechanical intubation 09/18: [facility] refused to accept pt for ECMO 09/21: Attempted to transfer pt to [facility] and [place] [facility] for possible assessment for ECMO, however both hospitals DO NOT have bed availability and are at capacity and pt may or may NOT be an ECMO candidate at this time  09/23: Pt unproned  09/24:  Resumed proning protocol 09/25: Resumed proning protocol  09/26: Pt unproned 9/27-  No onvernight events, patient remains in ARDS on elevated settings. 08/25/20- patient is on maximal settings with sedation. She is on day 11 of her illness with little improvement.  Ive attempted to reach husband via cell no answer, ive successfully found her daughter and spoke with her regarding care plan and she is agreeable but requested for me to speak with [name] and has provided me with secondary phone number [phone] but again only voice mail was reachable.  Have left voicemail and will discuss trache with family when they are available.    PAST MEDICAL HISTORY :   has a past medical history of Anemia and Thyroid disease.  has a past surgical history that includes Leg Surgery and Cholecystectomy. Prior to Admission medications   Medication Sig Start Date End Date Taking? Authorizing Provider  cyclobenzaprine (FLEXERIL) 10 MG tablet Take 1 tablet (10 mg total) by mouth 3 (three) times daily as needed. 03/26/20   Fisher, Linden Dolin, PA-C  ferrous gluconate (FERGON) 324 MG tablet Take 1 tablet (324 mg total) by mouth 2 (two) times daily with a meal. 03/15/18    [name], MD  ferrous sulfate 325 (65 FE) MG EC tablet Take 325 mg by mouth daily.    [provider]  levothyroxine (SYNTHROID, LEVOTHROID) 125 MCG tablet Take 125 mcg by mouth daily before breakfast.    [provider]  traMADol (ULTRAM) 50 MG tablet Take 1 tablet (50 mg total) by mouth every 6 (six) hours as needed. 03/26/20   Caryn Section Linden Dolin, PA-C   No Known Allergies  FAMILY HISTORY:  family history is not on file. SOCIAL HISTORY:  reports that she has never smoked. She has never used smokeless tobacco. She reports that she does not drink alcohol.  REVIEW OF SYSTEMS:  Unable to assess pt mechanically intubated  SUBJECTIVE:  Unable to assess pt mechanically intubated   VITAL SIGNS: Temp:  [95.2 F (35.1 C)-99.5 F (37.5 C)] 99.5 F (37.5 C) (09/28 0600) Pulse Rate:  [65-93] 79 (09/28 0900) Resp:  [24-30] 30 (09/28 0900) BP: (91-128)/(60-80) 91/60 (09/28 0900) SpO2:  [88 %-94 %] 92 % (09/28 0900) FiO2 (%):  [40 %-45 %] 45 % (09/28 0820) Weight:  [131.6 kg] 131.6 kg (09/28 0500)  PHYSICAL EXAMINATION: Physical examination is limited due to need for PPE/CAPR General: acutely ill appearing female, NAD mechanically ventilated, patient supine Neuro: sedated, PERRL HEENT: supple  Cardiovascular: Monitor shows sinus rhythm  Lungs: diminished breath sounds throughout.  No wheezes.  Abdomen: hypoactive BS x4, obese, soft, non distended  Musculoskeletal: 1+ generalized edema  Skin: intact no rashes or lesions present   Recent Labs  Lab 08/22/20  0617 08/23/20 0440 08/24/20 1511  NA 143 141 138  K 4.5 4.2 3.3*  CL 104 100 104  CO2 _0 BUN 18 22* 25*  CREATININE 0.67 0.83 0.70  GLUCOSE 224* 214* 162*   Recent Labs  Lab 08/24/20 1343 08/24/20 1706 08/25/20 0924  HGB 6.0* 6.8* 7.7*  HCT 19.9* 22.6* 24.7*  WBC 7.4 7.7 11.2*  PLT 225 262 302   DG Chest Port 1 View  Result Date: 08/25/2020 CLINICAL DATA:  Acute respiratory failure with  hypoxia EXAM: PORTABLE CHEST 1 VIEW COMPARISON:  08/22/2020 FINDINGS: Enteric and endotracheal tubes are unchanged in position. Shallow inspiration. Mild cardiac enlargement. Bilateral pulmonary infiltrates. Mild improvement since previous study. No pleural effusions. No pneumothorax. Mediastinal contours appear intact. IMPRESSION: Bilateral pulmonary infiltrates with improvement since previous study. Electronically Signed   By: Lucienne Capers M.D.   On: 08/25/2020 04:54    ASSESSMENT / PLAN:  Acute hypoxic respiratory failure secondary to COVID-19 pneumonia and ARDS Morbid Obesity, possible OSA  Full vent support for now-vent settings reviewed and established VAP bundle implemented SBT once all parameters met  Will keep net negative  Continue IV steroids Completed course of remdesivir, azithromycin, and ceftriaxone  Maintain airborne and contact precautions  As needed bronchodilators (MDI) Vitamin C and zinc Antitussives Trend inflammatory markers Trend WBC and monitor fever curve Trend PCT   Troponin elevation due to demand ischemia Hypertrophic cardiomyopathy Continuous telemetry monitoring  Echo LVEF 45 to 50% Severe concentric LVH Diastolic dysfunction No pressor requirement Diuresis as needed  Mild acute renal failure  Hypokalemia  Hypernatremia  Trend BMP  Replace electrolytes as indicated  Monitor UOP Avoid nephrotoxic medications   Elevated liver enzymes  Trend hepatic panel   Anemia without obvious acute blood loss Trend CBC Monitor for s/sx of bleeding  Transfuse for hgb <7 Received transfusions 9/23  Hyperglycemia  CBG's q4hrs  SSI   Acute encephalopathy secondary to COVID-19  Mechanical ventilation pain/discomfort Maintain RASS goal -1 to -2 Continue versed, precedex, and fentanyl gtts to maintain RASS goal PRN vecuronium for vent dyssynchrony   WUA daily as tolerated   Best Practice: VTE px: subq heparin and SCD's Diet: continue TF's  Code  status: Full code  Disposition: ICU Family: No family at bedside to update at this time    Critical care provider statement:    Critical care time (minutes):  33   Critical care time was exclusive of:  Separately billable procedures and  treating other patients   Critical care was necessary to treat or prevent imminent or  life-threatening deterioration of the following conditions:  acute hypoxemic respiratory failure, multiple comorbid conditions   Critical care was time spent personally by me on the following  activities:  Development of treatment plan with patient or surrogate,  discussions with consultants, evaluation of patient's response to  treatment, examination of patient, obtaining history from patient or  surrogate, ordering and performing treatments and interventions, ordering  and review of laboratory studies and re-evaluation of patient's condition   I assumed direction of critical care for this patient from another  provider in my specialty: no      Ottie Glazier, M.D.  Pulmonary & [place]

## 2020-08-25 NOTE — Progress Notes (Signed)
Pt dyssynchronous with the ventilator and RR in the 40's. Pt given one time dose of ativan and vec. Pt now comfortable and tolerating 45% fio2 on the vent.

## 2020-08-25 NOTE — Progress Notes (Signed)
Pharmacy Electrolyte Monitoring Consult:  Labs:  Sodium (mmol/L)  Date Value  08/25/2020 136  03/03/2014 140   Potassium (mmol/L)  Date Value  08/25/2020 3.3 (L)  03/03/2014 3.9   Magnesium (mg/dL)  Date Value  08/25/2020 2.0   Phosphorus (mg/dL)  Date Value  08/25/2020 3.7   Calcium (mg/dL)  Date Value  08/25/2020 7.6 (L)   Calcium, Total (mg/dL)  Date Value  03/03/2014 9.3   Albumin (g/dL)  Date Value  08/25/2020 2.3 (L)  03/03/2014 4.2    Assessment: Pharmacy has been consulted to assist in monitoring and replacement of electrolytes in this 44 yo female presenting with SOB on on 08/13/2020 found to have COVID PNA. Pt intubated on 9/17 and tube feeds started on 9/23.  9/26 Sodium improving 147> 143 > 141 on free water flushes started 9/22. If Na consistently trending back up, may need to increase free water flushes  9/27 Sodium continues to improve on free water flushes 200 mL q6h and diuresis as needed (last lasix dose 20 mg x1 on 9/26). Scr stable <1 with good UOP and unknown last bowel movement.   9/28 K 3.3, other electrolytes WNL. Renal function stable. Pt on tube feeds and free water flushes.   Goal: Electrolytes WNL  Plan: -Will order Klor-con packet 40 mEq per tube x1 dose. -No additional replacement needed at this time. -Recheck electrolytes with AM labs.   Jacobo Forest PharmD Candidate 2022 08/25/2020 1:44 PM

## 2020-08-26 LAB — CBC WITH DIFFERENTIAL/PLATELET
Abs Immature Granulocytes: 0.17 10*3/uL — ABNORMAL HIGH (ref 0.00–0.07)
Basophils Absolute: 0 10*3/uL (ref 0.0–0.1)
Basophils Relative: 0 %
Eosinophils Absolute: 0 10*3/uL (ref 0.0–0.5)
Eosinophils Relative: 0 %
HCT: 24.2 % — ABNORMAL LOW (ref 36.0–46.0)
Hemoglobin: 7.3 g/dL — ABNORMAL LOW (ref 12.0–15.0)
Immature Granulocytes: 2 %
Lymphocytes Relative: 8 %
Lymphs Abs: 0.7 10*3/uL (ref 0.7–4.0)
MCH: 23 pg — ABNORMAL LOW (ref 26.0–34.0)
MCHC: 30.2 g/dL (ref 30.0–36.0)
MCV: 76.3 fL — ABNORMAL LOW (ref 80.0–100.0)
Monocytes Absolute: 0.2 10*3/uL (ref 0.1–1.0)
Monocytes Relative: 3 %
Neutro Abs: 7.3 10*3/uL (ref 1.7–7.7)
Neutrophils Relative %: 87 %
Platelets: 274 10*3/uL (ref 150–400)
RBC: 3.17 MIL/uL — ABNORMAL LOW (ref 3.87–5.11)
RDW: 24.5 % — ABNORMAL HIGH (ref 11.5–15.5)
Smear Review: NORMAL
WBC: 8.5 10*3/uL (ref 4.0–10.5)
nRBC: 0.4 % — ABNORMAL HIGH (ref 0.0–0.2)

## 2020-08-26 LAB — BASIC METABOLIC PANEL
Anion gap: 7 (ref 5–15)
BUN: 27 mg/dL — ABNORMAL HIGH (ref 6–20)
CO2: 28 mmol/L (ref 22–32)
Calcium: 8.7 mg/dL — ABNORMAL LOW (ref 8.9–10.3)
Chloride: 101 mmol/L (ref 98–111)
Creatinine, Ser: 0.98 mg/dL (ref 0.44–1.00)
GFR calc Af Amer: >60 mL/min (ref >60)
GFR calc non Af Amer: >60 mL/min (ref >60)
Glucose, Bld: 150 mg/dL — ABNORMAL HIGH (ref 70–99)
Potassium: 4 mmol/L (ref 3.5–5.1)
Sodium: 136 mmol/L (ref 135–145)

## 2020-08-26 LAB — GLUCOSE, CAPILLARY
Glucose-Capillary: 105 mg/dL — ABNORMAL HIGH (ref 70–99)
Glucose-Capillary: 106 mg/dL — ABNORMAL HIGH (ref 70–99)
Glucose-Capillary: 106 mg/dL — ABNORMAL HIGH (ref 70–99)
Glucose-Capillary: 107 mg/dL — ABNORMAL HIGH (ref 70–99)
Glucose-Capillary: 119 mg/dL — ABNORMAL HIGH (ref 70–99)
Glucose-Capillary: 131 mg/dL — ABNORMAL HIGH (ref 70–99)

## 2020-08-26 LAB — MAGNESIUM: Magnesium: 2.2 mg/dL (ref 1.7–2.4)

## 2020-08-26 LAB — PHOSPHORUS: Phosphorus: 3.8 mg/dL (ref 2.5–4.6)

## 2020-08-26 NOTE — Progress Notes (Signed)
Pharmacy Electrolyte Monitoring Consult:  Labs:  Sodium (mmol/L)  Date Value  08/26/2020 136  03/03/2014 140   Potassium (mmol/L)  Date Value  08/26/2020 4.0  03/03/2014 3.9   Magnesium (mg/dL)  Date Value  08/26/2020 2.2   Phosphorus (mg/dL)  Date Value  08/26/2020 3.8   Calcium (mg/dL)  Date Value  08/26/2020 8.7 (L)   Calcium, Total (mg/dL)  Date Value  03/03/2014 9.3   Albumin (g/dL)  Date Value  08/25/2020 2.3 (L)  03/03/2014 4.2    Assessment: Pharmacy has been consulted to assist in monitoring and replacement of electrolytes in this 44 yo female presenting with SOB on on 08/13/2020 found to have COVID PNA. Pt intubated on 9/17 and tube feeds started on 9/23.  9/26 Sodium improving 147> 143 > 141 on free water flushes started 9/22. If Na consistently trending back up, may need to increase free water flushes  9/27 Sodium continues to improve on free water flushes 200 mL q6h and diuresis as needed (last lasix dose 20 mg x1 on 9/26). Scr stable <1 with good UOP and unknown last bowel movement.   9/28 K 3.3, other electrolytes WNL. Renal function stable. Pt on tube feeds and free water flushes.   Goal: Electrolytes WNL  Plan: No replacement indicated. Continue to follow along.  Tawnya Crook, PharmD Clinical Pharmacist 08/26/2020 1:47 PM

## 2020-08-26 NOTE — Progress Notes (Signed)
Name: CRISTELLA STIVER MRN: [id number] DOB: [birth year]    ADMISSION DATE:  08/13/2020  BRIEF PATIENT DESCRIPTION: 44 yo unvaccinated female admitted with COVID-19 pneumonia and ARDS   SIGNIFICANT EVENTS/STUDIES:  09/16: Pt admitted to ICU requiring Heated HFNC and NRB, however remained in the ER pending ICU bed availability  09/16: CT Head revealed no acute intracranial abnormality 09/17: Pt with worsening hypoxia requiring mechanical intubation 09/18: [facility] refused to accept pt for ECMO 09/21: Attempted to transfer pt to [facility] and [facility] for possible assessment for ECMO, however both hospitals DO NOT have bed availability and are at capacity and pt may or may NOT be an ECMO candidate at this time  09/23: Pt unproned  09/24:  Resumed proning protocol 09/25: Resumed proning protocol  09/26: Pt unproned 9/27-  No onvernight events, patient remains in ARDS on elevated settings. 08/25/20- patient is on maximal settings with sedation. She is on day 11 of her illness with little improvement.  Ive attempted to reach husband via cell no answer, ive successfully found her daughter and spoke with her regarding care plan and she is agreeable but requested for me to speak with [name] and has provided me with secondary phone number [phone] but again only voice mail was reachable.  Have left voicemail and will discuss trache with family when they are available.  08/26/20- patient without overnight events, ive called POA husband again today but was unable to reach him and left voice mail.    PAST MEDICAL HISTORY :   has a past medical history of Anemia and Thyroid disease.  has a past surgical history that includes Leg Surgery and Cholecystectomy. Prior to Admission medications   Medication Sig Start Date End Date Taking? Authorizing Provider  cyclobenzaprine (FLEXERIL) 10 MG tablet Take 1 tablet (10 mg total) by mouth 3 (three) times daily as needed. 03/26/20   Fisher, Linden Dolin,  PA-C  ferrous gluconate (FERGON) 324 MG tablet Take 1 tablet (324 mg total) by mouth 2 (two) times daily with a meal. 03/15/18   [name], MD  ferrous sulfate 325 (65 FE) MG EC tablet Take 325 mg by mouth daily.    [provider]  levothyroxine (SYNTHROID, LEVOTHROID) 125 MCG tablet Take 125 mcg by mouth daily before breakfast.    [provider]  traMADol (ULTRAM) 50 MG tablet Take 1 tablet (50 mg total) by mouth every 6 (six) hours as needed. 03/26/20   Caryn Section Linden Dolin, PA-C   No Known Allergies  FAMILY HISTORY:  family history is not on file. SOCIAL HISTORY:  reports that she has never smoked. She has never used smokeless tobacco. She reports that she does not drink alcohol.  REVIEW OF SYSTEMS:  Unable to assess pt mechanically intubated  SUBJECTIVE:  Unable to assess pt mechanically intubated   VITAL SIGNS: Temp:  [97.5 F (36.4 C)-99 F (37.2 C)] 97.5 F (36.4 C) (09/29 1500) Pulse Rate:  [70-79] 71 (09/29 1500) Resp:  [27-30] 30 (09/29 1500) BP: (88-109)/(55-67) 109/64 (09/29 1500) SpO2:  [89 %-96 %] 93 % (09/29 1500) FiO2 (%):  [45 %] 45 % (09/29 1500) Weight:  [133.2 kg] 133.2 kg (09/29 0500)  PHYSICAL EXAMINATION: Physical examination is limited due to need for PPE/CAPR General: acutely ill appearing female, NAD mechanically ventilated, patient supine Neuro: sedated, PERRL HEENT: supple  Cardiovascular: Monitor shows sinus rhythm  Lungs: diminished breath sounds throughout.  No wheezes.  Abdomen: hypoactive BS x4, obese, soft, non  distended  Musculoskeletal: 1+ generalized edema  Skin: intact no rashes or lesions present   Recent Labs  Lab 08/24/20 1511 08/25/20 0924 08/26/20 0428  NA 138 136 136  K 3.3* 3.3* 4.0  CL 104 104 101  CO2 25 25 28   BUN 25* 26* 27*  CREATININE 0.70 0.81 0.98  GLUCOSE 162* 140* 150*   Recent Labs  Lab 08/24/20 1706 08/25/20 0924 08/26/20 0428  HGB 6.8* 7.7* 7.3*  HCT 22.6* 24.7* 24.2*  WBC 7.7  11.2* 8.5  PLT 262 302 274   DG Chest Port 1 View  Result Date: 08/25/2020 CLINICAL DATA:  Acute respiratory failure with hypoxia EXAM: PORTABLE CHEST 1 VIEW COMPARISON:  08/22/2020 FINDINGS: Enteric and endotracheal tubes are unchanged in position. Shallow inspiration. Mild cardiac enlargement. Bilateral pulmonary infiltrates. Mild improvement since previous study. No pleural effusions. No pneumothorax. Mediastinal contours appear intact. IMPRESSION: Bilateral pulmonary infiltrates with improvement since previous study. Electronically Signed   By: Lucienne Capers M.D.   On: 08/25/2020 04:54    ASSESSMENT / PLAN:  Acute hypoxic respiratory failure secondary to COVID-19 pneumonia and ARDS Morbid Obesity, possible OSA  Full vent support for now-vent settings reviewed and established VAP bundle implemented SBT once all parameters met  Will keep net negative  Continue IV steroids Completed course of remdesivir, azithromycin, and ceftriaxone  Maintain airborne and contact precautions  As needed bronchodilators (MDI) Vitamin C and zinc Antitussives Trend inflammatory markers Trend WBC and monitor fever curve Trend PCT   Troponin elevation due to demand ischemia Hypertrophic cardiomyopathy Continuous telemetry monitoring  Echo LVEF 45 to 50% Severe concentric LVH Diastolic dysfunction No pressor requirement Diuresis as needed  Mild acute renal failure  Hypokalemia  Hypernatremia  Trend BMP  Replace electrolytes as indicated  Monitor UOP Avoid nephrotoxic medications   Elevated liver enzymes  Trend hepatic panel   Anemia without obvious acute blood loss Trend CBC Monitor for s/sx of bleeding  Transfuse for hgb <7 Received transfusions 9/23  Hyperglycemia  CBG's q4hrs  SSI   Acute encephalopathy secondary to COVID-19  Mechanical ventilation pain/discomfort Maintain RASS goal -1 to -2 Continue versed, precedex, and fentanyl gtts to maintain RASS goal PRN vecuronium  for vent dyssynchrony   WUA daily as tolerated   Best Practice: VTE px: subq heparin and SCD's Diet: continue TF's  Code status: Full code  Disposition: ICU Family: No family at bedside to update at this time    Critical care provider statement:    Critical care time (minutes):  33   Critical care time was exclusive of:  Separately billable procedures and  treating other patients   Critical care was necessary to treat or prevent imminent or  life-threatening deterioration of the following conditions:  acute hypoxemic respiratory failure, multiple comorbid conditions   Critical care was time spent personally by me on the following  activities:  Development of treatment plan with patient or surrogate,  discussions with consultants, evaluation of patient's response to  treatment, examination of patient, obtaining history from patient or  surrogate, ordering and performing treatments and interventions, ordering  and review of laboratory studies and re-evaluation of patient's condition   I assumed direction of critical care for this patient from another  provider in my specialty: no      Ottie Glazier, M.D.  Pulmonary & [place]

## 2020-08-27 LAB — CBC WITH DIFFERENTIAL/PLATELET
Abs Immature Granulocytes: 0.16 10*3/uL — ABNORMAL HIGH (ref 0.00–0.07)
Basophils Absolute: 0 10*3/uL (ref 0.0–0.1)
Basophils Relative: 0 %
Eosinophils Absolute: 0 10*3/uL (ref 0.0–0.5)
Eosinophils Relative: 0 %
HCT: 25.1 % — ABNORMAL LOW (ref 36.0–46.0)
Hemoglobin: 7.5 g/dL — ABNORMAL LOW (ref 12.0–15.0)
Immature Granulocytes: 2 %
Lymphocytes Relative: 5 %
Lymphs Abs: 0.5 10*3/uL — ABNORMAL LOW (ref 0.7–4.0)
MCH: 22.8 pg — ABNORMAL LOW (ref 26.0–34.0)
MCHC: 29.9 g/dL — ABNORMAL LOW (ref 30.0–36.0)
MCV: 76.3 fL — ABNORMAL LOW (ref 80.0–100.0)
Monocytes Absolute: 0.4 10*3/uL (ref 0.1–1.0)
Monocytes Relative: 4 %
Neutro Abs: 8.3 10*3/uL — ABNORMAL HIGH (ref 1.7–7.7)
Neutrophils Relative %: 89 %
Platelets: 278 10*3/uL (ref 150–400)
RBC: 3.29 MIL/uL — ABNORMAL LOW (ref 3.87–5.11)
RDW: 24.3 % — ABNORMAL HIGH (ref 11.5–15.5)
Smear Review: NORMAL
WBC: 9.3 10*3/uL (ref 4.0–10.5)
nRBC: 0 % (ref 0.0–0.2)

## 2020-08-27 LAB — MAGNESIUM: Magnesium: 2.2 mg/dL (ref 1.7–2.4)

## 2020-08-27 LAB — GLUCOSE, CAPILLARY
Glucose-Capillary: 111 mg/dL — ABNORMAL HIGH (ref 70–99)
Glucose-Capillary: 112 mg/dL — ABNORMAL HIGH (ref 70–99)
Glucose-Capillary: 124 mg/dL — ABNORMAL HIGH (ref 70–99)
Glucose-Capillary: 136 mg/dL — ABNORMAL HIGH (ref 70–99)
Glucose-Capillary: 137 mg/dL — ABNORMAL HIGH (ref 70–99)
Glucose-Capillary: 146 mg/dL — ABNORMAL HIGH (ref 70–99)

## 2020-08-27 LAB — PHOSPHORUS: Phosphorus: 4 mg/dL (ref 2.5–4.6)

## 2020-08-27 MED ORDER — VITAL 1.5 CAL PO LIQD
1000.0000 mL | ORAL | Status: DC
  Administered 2020-08-27 – 2020-09-03 (×9): 1000 mL

## 2020-08-27 MED ORDER — PROSOURCE TF PO LIQD
45.0000 mL | Freq: Three times a day (TID) | ORAL | Status: DC
  Administered 2020-08-27 – 2020-09-03 (×22): 45 mL
  Filled 2020-08-27 (×3): qty 45

## 2020-08-27 NOTE — Progress Notes (Signed)
Nutrition Follow-up  DOCUMENTATION CODES:   Obesity unspecified  INTERVENTION:  Initiate new goal TF regimen of Vital 1.5 Cal at 70 mL/hr (1680 mL goal daily volume) + PROSource TF 45 mL TID per tube. Provides 2640 kcal, 146 grams of protein, 1277 mL H2O daily.  NUTRITION DIAGNOSIS:   Inadequate oral intake related to inability to eat as evidenced by NPO status.  Ongoing.  GOAL:   Patient will meet greater than or equal to 90% of their needs  Met with TF regimen.  MONITOR:   Vent status, Labs, Weight trends, TF tolerance, I & O's  REASON FOR ASSESSMENT:   Ventilator, Consult Enteral/tube feeding initiation and management  ASSESSMENT:   44 year old female admitted with COVID-19 PNA.  9/17 intubated 9/17 proning protocol initiated  Patient is currently intubated on ventilator support MV: 14.3 L/min Temp (24hrs), Avg:97.9 F (36.6 C), Min:97.5 F (36.4 C), Max:98.6 F (37 C)  Medications reviewed and include: vitamin C 500 mg daily per tube, folic acid 1 mg daily IV, free water 200 mL Q6hrs, Lasix 20 mg BID, Novolog 0-20 units Q4hrs, Novolog 5 units Q4hrs, MVI daily, senna-docusate 1 tablet BID, thiamine 100 mg daily, zinc sulfate 220 mg daily per tube, Precedex gtt, fentanyl gtt, Versed gtt.  Labs reviewed: CBG 107-146.  I/O: 5250 mL UOP yesterday (1.6 mL/kg/hr)  Weight trend: 133 kg on 9/30; +13.8 kg from 9/16  Enteral Access: 18 Fr. OGT placed 9/17; terminates in stomach per abdominal x-ray 9/17; per chest x-ray 9/28 tube is in unchanged position  TF regimen: Vital 1.5 Cal at 60 mL/hr + PROSource 90 mL Bid per tube  Discussed with RN and on rounds.  Diet Order:   Diet Order            Diet NPO time specified  Diet effective now                EDUCATION NEEDS:   No education needs have been identified at this time  Skin:  Skin Assessment: Reviewed RN Assessment  Last BM:  Unknown  Height:   Ht Readings from Last 1 Encounters:  08/25/20 5'  9.13" (1.756 m)   Weight:   Wt Readings from Last 1 Encounters:  08/27/20 133 kg   Ideal Body Weight:  65.9 kg  BMI:  Body mass index is 43.13 kg/m.  Estimated Nutritional Needs:   Kcal:  2600-2800  Protein:  145-155 grams  Fluid:  >/= 2 L/day  Jacklynn Barnacle, MS, RD, LDN Pager number available on Amion

## 2020-08-27 NOTE — Progress Notes (Signed)
Pharmacy Electrolyte Monitoring Consult:  Labs:  Sodium (mmol/L)  Date Value  08/26/2020 136  03/03/2014 140   Potassium (mmol/L)  Date Value  08/26/2020 4.0  03/03/2014 3.9   Magnesium (mg/dL)  Date Value  08/27/2020 2.2   Phosphorus (mg/dL)  Date Value  08/27/2020 4.0   Calcium (mg/dL)  Date Value  08/26/2020 8.7 (L)   Calcium, Total (mg/dL)  Date Value  03/03/2014 9.3   Albumin (g/dL)  Date Value  08/25/2020 2.3 (L)  03/03/2014 4.2    Assessment: Pharmacy has been consulted to assist in monitoring and replacement of electrolytes in this 44 yo female presenting with SOB on on 08/13/2020 found to have COVID PNA. Pt intubated on 9/17 and tube feeds started on 9/23.  9/26 Sodium improving 147> 143 > 141 on free water flushes started 9/22. If Na consistently trending back up, may need to increase free water flushes  9/27 Sodium continues to improve on free water flushes 200 mL q6h and diuresis as needed (last lasix dose 20 mg x1 on 9/26). Scr stable <1 with good UOP and unknown last bowel movement.   9/28 K 3.3, other electrolytes WNL. Renal function stable. Pt on tube feeds and free water flushes.   9/30 Electrolyes WNL. Renal function stable with good UOP. Pt on tube feeds and free water flushes 200 mL every 6 hours.   Goal: Electrolytes WNL  Plan: No replacement indicated. Continue to follow along.  Jacobo Forest PharmD Candidate 2022 08/27/2020 9:17 AM

## 2020-08-27 NOTE — Progress Notes (Signed)
Name: Kathy Vega MRN: [id number] DOB: 12/14/1975    ADMISSION DATE:  08/13/2020  BRIEF PATIENT DESCRIPTION: 44 yo unvaccinated female admitted with COVID-19 pneumonia and ARDS   SIGNIFICANT EVENTS/STUDIES:  09/16: Pt admitted to ICU requiring Heated HFNC and NRB, however remained in the ER pending ICU bed availability  09/16: CT Head revealed no acute intracranial abnormality 09/17: Pt with worsening hypoxia requiring mechanical intubation 09/18: [facility] refused to accept pt for ECMO 09/21: [facility]ed to [facility] [place] [facility] for possible assessment for ECMO, however both hospitals DO NOT have bed availability and are at capacity and pt may or may NOT be an ECMO candidate at this time  09/23: Pt unproned  09/24:  Resumed proning protocol 09/25: Resumed proning protocol  09/26: Pt unproned 9/27-  No onvernight events, patient remains in ARDS on elevated settings. 08/25/20- patient is on maximal settings with sedation. She is on day 11 of her illness with little improvement.  Ive attempted to reach husband via cell no answer, ive successfully found her daughter and spoke with her regarding care plan and she is agreeable but requested for me to speak with [name] and has provided me with secondary phone number [phone] but again only voice mail was reachable.  Have left voicemail and will discuss trache with family when they are available.  08/26/20- patient without overnight events, ive called POA husband again today but was unable to reach him and left voice mail.  08/27/20- patient critically ill with overall poor prognosis, she had desaturation event today and was difficult to improve requiring multiple changes on ventilator by RT.    PAST MEDICAL HISTORY :   has a past medical history of Anemia and Thyroid disease.  has a past surgical history that includes Leg Surgery and Cholecystectomy. Prior to Admission medications   Medication Sig Start Date End  Date Taking? Authorizing Provider  cyclobenzaprine (FLEXERIL) 10 MG tablet Take 1 tablet (10 mg total) by mouth 3 (three) times daily as needed. 03/26/20   Fisher, Linden Dolin, PA-C  ferrous gluconate (FERGON) 324 MG tablet Take 1 tablet (324 mg total) by mouth 2 (two) times daily with a meal. 03/15/18   [name], MD  ferrous sulfate 325 (65 FE) MG EC tablet Take 325 mg by mouth daily.    [provider]  levothyroxine (SYNTHROID, LEVOTHROID) 125 MCG tablet Take 125 mcg by mouth daily before breakfast.    [provider]  traMADol (ULTRAM) 50 MG tablet Take 1 tablet (50 mg total) by mouth every 6 (six) hours as needed. 03/26/20   Caryn Section Linden Dolin, PA-C   No Known Allergies  FAMILY HISTORY:  family history is not on file. SOCIAL HISTORY:  reports that she has never smoked. She has never used smokeless tobacco. She reports that she does not drink alcohol.  REVIEW OF SYSTEMS:  Unable to assess pt mechanically intubated  SUBJECTIVE:  Unable to assess pt mechanically intubated   VITAL SIGNS: Temp:  [97.5 F (36.4 C)-98.8 F (37.1 C)] 98.2 F (36.8 C) (09/30 1800) Pulse Rate:  [72-99] 99 (09/30 1800) Resp:  [15-31] 30 (09/30 1800) BP: (93-167)/(59-91) 125/77 (09/30 1800) SpO2:  [84 %-100 %] 100 % (09/30 1800) FiO2 (%):  [45 %-100 %] 75 % (09/30 1800) Weight:  [650 kg] 133 kg (09/30 0449)  PHYSICAL EXAMINATION: Physical examination is limited due to need for PPE/CAPR General: acutely ill appearing female, NAD mechanically ventilated, patient supine Neuro:  sedated, PERRL HEENT: supple  Cardiovascular: Monitor shows sinus rhythm  Lungs: diminished breath sounds throughout.  No wheezes.  Abdomen: hypoactive BS x4, obese, soft, non distended  Musculoskeletal: 1+ generalized edema  Skin: intact no rashes or lesions present   Recent Labs  Lab 08/24/20 1511 08/25/20 0924 08/26/20 0428  NA 138 136 136  K 3.3* 3.3* 4.0  CL 104 104 101  CO2 _0 BUN 25* 26* 27*   CREATININE 0.70 0.81 0.98  GLUCOSE 162* 140* 150*   Recent Labs  Lab 08/25/20 0924 08/26/20 0428 08/27/20 0446  HGB 7.7* 7.3* 7.5*  HCT 24.7* 24.2* 25.1*  WBC 11.2* 8.5 9.3  PLT 302 274 278   No results found.  ASSESSMENT / PLAN:  Acute hypoxic respiratory failure secondary to COVID-19 pneumonia and ARDS Morbid Obesity, possible OSA  Full vent support for now-vent settings reviewed and established VAP bundle implemented SBT once all parameters met  Will keep net negative  Continue IV steroids Completed course of remdesivir, azithromycin, and ceftriaxone  Maintain airborne and contact precautions  As needed bronchodilators (MDI) Vitamin C and zinc Antitussives Trend inflammatory markers Trend WBC and monitor fever curve Trend PCT   Troponin elevation due to demand ischemia Hypertrophic cardiomyopathy Continuous telemetry monitoring  Echo LVEF 45 to 50% Severe concentric LVH Diastolic dysfunction No pressor requirement Diuresis as needed  Mild acute renal failure  Hypokalemia  Hypernatremia  Trend BMP  Replace electrolytes as indicated  Monitor UOP Avoid nephrotoxic medications   Elevated liver enzymes  Trend hepatic panel   Anemia without obvious acute blood loss Trend CBC Monitor for s/sx of bleeding  Transfuse for hgb <7 Received transfusions 9/23  Hyperglycemia  CBG's q4hrs  SSI   Acute encephalopathy secondary to COVID-19  Mechanical ventilation pain/discomfort Maintain RASS goal -1 to -2 Continue versed, precedex, and fentanyl gtts to maintain RASS goal PRN vecuronium for vent dyssynchrony   WUA daily as tolerated   Best Practice: VTE px: subq heparin and SCD's Diet: continue TF's  Code status: Full code  Disposition: ICU Family: No family at bedside to update at this time    Critical care provider statement:    Critical care time (minutes):  33   Critical care time was exclusive of:  Separately billable procedures and  treating  other patients   Critical care was necessary to treat or prevent imminent or  life-threatening deterioration of the following conditions:  acute hypoxemic respiratory failure, multiple comorbid conditions   Critical care was time spent personally by me on the following  activities:  Development of treatment plan with patient or surrogate,  discussions with consultants, evaluation of patient's response to  treatment, examination of patient, obtaining history from patient or  surrogate, ordering and performing treatments and interventions, ordering  and review of laboratory studies and re-evaluation of patient's condition   I assumed direction of critical care for this patient from another  provider in my specialty: no      Ottie Glazier, M.D.  Pulmonary & [place]

## 2020-08-28 LAB — CBC WITH DIFFERENTIAL/PLATELET
Abs Immature Granulocytes: 0.16 10*3/uL — ABNORMAL HIGH (ref 0.00–0.07)
Basophils Absolute: 0 10*3/uL (ref 0.0–0.1)
Basophils Relative: 0 %
Eosinophils Absolute: 0 10*3/uL (ref 0.0–0.5)
Eosinophils Relative: 0 %
HCT: 23.1 % — ABNORMAL LOW (ref 36.0–46.0)
Hemoglobin: 7.4 g/dL — ABNORMAL LOW (ref 12.0–15.0)
Immature Granulocytes: 2 %
Lymphocytes Relative: 5 %
Lymphs Abs: 0.4 10*3/uL — ABNORMAL LOW (ref 0.7–4.0)
MCH: 23.5 pg — ABNORMAL LOW (ref 26.0–34.0)
MCHC: 32 g/dL (ref 30.0–36.0)
MCV: 73.3 fL — ABNORMAL LOW (ref 80.0–100.0)
Monocytes Absolute: 0.4 10*3/uL (ref 0.1–1.0)
Monocytes Relative: 4 %
Neutro Abs: 7.6 10*3/uL (ref 1.7–7.7)
Neutrophils Relative %: 89 %
Platelets: 300 10*3/uL (ref 150–400)
RBC: 3.15 MIL/uL — ABNORMAL LOW (ref 3.87–5.11)
RDW: 24.3 % — ABNORMAL HIGH (ref 11.5–15.5)
Smear Review: NORMAL
WBC: 8.6 10*3/uL (ref 4.0–10.5)
nRBC: 0 % (ref 0.0–0.2)

## 2020-08-28 LAB — GLUCOSE, CAPILLARY
Glucose-Capillary: 157 mg/dL — ABNORMAL HIGH (ref 70–99)
Glucose-Capillary: 133 mg/dL — ABNORMAL HIGH (ref 70–99)
Glucose-Capillary: 174 mg/dL — ABNORMAL HIGH (ref 70–99)
Glucose-Capillary: 152 mg/dL — ABNORMAL HIGH (ref 70–99)
Glucose-Capillary: 132 mg/dL — ABNORMAL HIGH (ref 70–99)

## 2020-08-28 LAB — PHOSPHORUS: Phosphorus: 4.1 mg/dL (ref 2.5–4.6)

## 2020-08-28 LAB — MAGNESIUM: Magnesium: 2.1 mg/dL (ref 1.7–2.4)

## 2020-08-28 MED ORDER — LACTULOSE 10 GM/15ML PO SOLN
30.0000 g | Freq: Once | ORAL | Status: AC
  Administered 2020-08-28: 30 g
  Filled 2020-08-28: qty 60

## 2020-08-28 NOTE — Progress Notes (Signed)
Pharmacy Electrolyte Monitoring Consult:  Labs:  Sodium (mmol/L)  Date Value  08/26/2020 136  03/03/2014 140   Potassium (mmol/L)  Date Value  08/26/2020 4.0  03/03/2014 3.9   Magnesium (mg/dL)  Date Value  08/28/2020 2.1   Phosphorus (mg/dL)  Date Value  08/28/2020 4.1   Calcium (mg/dL)  Date Value  08/26/2020 8.7 (L)   Calcium, Total (mg/dL)  Date Value  03/03/2014 9.3   Albumin (g/dL)  Date Value  08/25/2020 2.3 (L)  03/03/2014 4.2    Assessment: Pharmacy has been consulted to assist in monitoring and replacement of electrolytes in this 44 yo female presenting with SOB on on 08/13/2020 found to have COVID PNA. Pt intubated on 9/17 and tube feeds started on 9/23.  9/26 Sodium improving 147> 143 > 141 on free water flushes started 9/22. If Na consistently trending back up, may need to increase free water flushes  9/27 Sodium continues to improve on free water flushes 200 mL q6h and diuresis as needed (last lasix dose 20 mg x1 on 9/26). Scr stable <1 with good UOP and unknown last bowel movement.   9/28 K 3.3, other electrolytes WNL. Renal function stable. Pt on tube feeds and free water flushes.   9/30 Electrolyes WNL. Renal function stable with good UOP. Pt on tube feeds and free water flushes 200 mL every 6 hours.   Goal: Electrolytes WNL  Plan: Electrolytes stable. Will defer lab monitoring to MD. Consult discontinued for now.  Tawnya Crook, PharmD 08/28/2020 12:02 PM

## 2020-08-28 NOTE — Progress Notes (Signed)
Assisted tele visit to patient with husband, Mliss Fritz.  Maryelizabeth Rowan, RN

## 2020-08-28 NOTE — Progress Notes (Signed)
Name: Kathy Vega MRN: [id number] DOB: 1976-08-25    ADMISSION DATE:  08/13/2020  BRIEF PATIENT DESCRIPTION: 44 yo unvaccinated female admitted with COVID-19 pneumonia and ARDS   SIGNIFICANT EVENTS/STUDIES:  09/16: Pt admitted to ICU requiring Heated HFNC and NRB, however remained in the ER pending ICU bed availability  09/16: CT Head revealed no acute intracranial abnormality 09/17: Pt with worsening hypoxia requiring mechanical intubation 09/18: [facility] refused to accept pt for ECMO 09/21: Attempted to transfer pt to [facility] and [facility] for possible assessment for ECMO, however both hospitals DO NOT have bed availability and are at capacity and pt may or may NOT be an ECMO candidate at this time  09/23: Pt unproned  09/24:  Resumed proning protocol 09/25: Resumed proning protocol  09/26: Pt unproned 9/27-  No onvernight events, patient remains in ARDS on elevated settings. 08/25/20- patient is on maximal settings with sedation. She is on day 11 of her illness with little improvement.  Ive attempted to reach husband via cell no answer, ive successfully found her daughter and spoke with her regarding care plan and she is agreeable but requested for me to speak with [name] and has provided me with secondary phone number [phone] but again only voice mail was reachable.  Have left voicemail and will discuss trache with family when they are available.  08/26/20- patient without overnight events, ive called POA husband again today but was unable to reach him and left voice mail.  08/27/20- patient critically ill with overall poor prognosis, she had desaturation event today and was difficult to improve requiring multiple changes on ventilator by RT.  08/28/20- Patient is weaned to 60% FiO2 on MV.  Have called Garlan Fillers today and updated him. We discussed trache and he is agreeable.    PAST MEDICAL HISTORY :   has a past medical history of Anemia and Thyroid  disease.  has a past surgical history that includes Leg Surgery and Cholecystectomy. Prior to Admission medications   Medication Sig Start Date End Date Taking? Authorizing Provider  cyclobenzaprine (FLEXERIL) 10 MG tablet Take 1 tablet (10 mg total) by mouth 3 (three) times daily as needed. 03/26/20   Fisher, Linden Dolin, PA-C  ferrous gluconate (FERGON) 324 MG tablet Take 1 tablet (324 mg total) by mouth 2 (two) times daily with a meal. 03/15/18   [name], MD  ferrous sulfate 325 (65 FE) MG EC tablet Take 325 mg by mouth daily.    [provider]  levothyroxine (SYNTHROID, LEVOTHROID) 125 MCG tablet Take 125 mcg by mouth daily before breakfast.    [provider]  traMADol (ULTRAM) 50 MG tablet Take 1 tablet (50 mg total) by mouth every 6 (six) hours as needed. 03/26/20   Caryn Section Linden Dolin, PA-C   No Known Allergies  FAMILY HISTORY:  family history is not on file. SOCIAL HISTORY:  reports that she has never smoked. She has never used smokeless tobacco. She reports that she does not drink alcohol.  REVIEW OF SYSTEMS:  Unable to assess pt mechanically intubated  SUBJECTIVE:  Unable to assess pt mechanically intubated   VITAL SIGNS: Temp:  [97.5 F (36.4 C)-98.4 F (36.9 C)] 97.5 F (36.4 C) (10/01 1100) Pulse Rate:  [75-99] 75 (10/01 1100) Resp:  [6-32] 30 (10/01 1100) BP: (101-129)/(63-77) 115/69 (10/01 1100) SpO2:  [94 %-100 %] 95 % (10/01 1315) FiO2 (%):  [60 %-75 %] 60 % (10/01 1315) Weight:  [131.5 kg] 131.5  kg (10/01 0453)  PHYSICAL EXAMINATION: Physical examination is limited due to need for PPE/CAPR General: acutely ill appearing female, NAD mechanically ventilated, patient supine Neuro: sedated, PERRL GCS3T HEENT: supple  Cardiovascular: Monitor shows sinus rhythm  Lungs: diminished breath sounds throughout.  No wheezes.  Abdomen: hypoactive BS x4, obese, soft, non distended  Musculoskeletal: 1+ generalized edema  Skin: intact no rashes or lesions  present   Recent Labs  Lab 08/24/20 1511 08/25/20 0924 08/26/20 0428  NA 138 136 136  K 3.3* 3.3* 4.0  CL 104 104 101  CO2 _0 BUN 25* 26* 27*  CREATININE 0.70 0.81 0.98  GLUCOSE 162* 140* 150*   Recent Labs  Lab 08/26/20 0428 08/27/20 0446 08/28/20 0644  HGB 7.3* 7.5* 7.4*  HCT 24.2* 25.1* 23.1*  WBC 8.5 9.3 8.6  PLT 274 278 300   No results found.  ASSESSMENT / PLAN:  Acute hypoxic respiratory failure secondary to COVID-19 pneumonia and ARDS Morbid Obesity, possible OSA  Full vent support for now-vent settings reviewed and established VAP bundle implemented SBT once all parameters met  Will keep net negative  Continue IV steroids Completed course of remdesivir, azithromycin, and ceftriaxone  Maintain airborne and contact precautions  As needed bronchodilators (MDI) Vitamin C and zinc Antitussives Trend inflammatory markers Trend WBC and monitor fever curve Trend PCT   Troponin elevation due to demand ischemia Hypertrophic cardiomyopathy Continuous telemetry monitoring  Echo LVEF 45 to 50% Severe concentric LVH Diastolic dysfunction No pressor requirement Diuresis as needed  Mild acute renal failure  Hypokalemia  Hypernatremia  Trend BMP  Replace electrolytes as indicated  Monitor UOP Avoid nephrotoxic medications   Elevated liver enzymes  Trend hepatic panel   Anemia without obvious acute blood loss Trend CBC Monitor for s/sx of bleeding  Transfuse for hgb <7 Received transfusions 9/23  Hyperglycemia  CBG's q4hrs  SSI   Acute encephalopathy secondary to COVID-19  Mechanical ventilation pain/discomfort Maintain RASS goal -1 to -2 Continue versed, precedex, and fentanyl gtts to maintain RASS goal PRN vecuronium for vent dyssynchrony   WUA daily as tolerated   Best Practice: VTE px: subq heparin and SCD's Diet: continue TF's  Code status: Full code  Disposition: ICU Family: No family at bedside to update at this  time    Critical care provider statement:    Critical care time (minutes):  33   Critical care time was exclusive of:  Separately billable procedures and  treating other patients   Critical care was necessary to treat or prevent imminent or  life-threatening deterioration of the following conditions:  acute hypoxemic respiratory failure, multiple comorbid conditions   Critical care was time spent personally by me on the following  activities:  Development of treatment plan with patient or surrogate,  discussions with consultants, evaluation of patient's response to  treatment, examination of patient, obtaining history from patient or  surrogate, ordering and performing treatments and interventions, ordering  and review of laboratory studies and re-evaluation of patient's condition   I assumed direction of critical care for this patient from another  provider in my specialty: no      Ottie Glazier, M.D.  Pulmonary & [place]

## 2020-08-28 NOTE — Progress Notes (Signed)
Per Dr A, orders to discontinued isolation for covid. Positive date 08/07/20.

## 2020-08-29 LAB — CBC WITH DIFFERENTIAL/PLATELET
Abs Immature Granulocytes: 0.11 10*3/uL — ABNORMAL HIGH (ref 0.00–0.07)
Basophils Absolute: 0 10*3/uL (ref 0.0–0.1)
Basophils Relative: 0 %
Eosinophils Absolute: 0 10*3/uL (ref 0.0–0.5)
Eosinophils Relative: 0 %
HCT: 22.1 % — ABNORMAL LOW (ref 36.0–46.0)
Hemoglobin: 6.6 g/dL — ABNORMAL LOW (ref 12.0–15.0)
Immature Granulocytes: 2 %
Lymphocytes Relative: 7 %
Lymphs Abs: 0.5 10*3/uL — ABNORMAL LOW (ref 0.7–4.0)
MCH: 23 pg — ABNORMAL LOW (ref 26.0–34.0)
MCHC: 29.9 g/dL — ABNORMAL LOW (ref 30.0–36.0)
MCV: 77 fL — ABNORMAL LOW (ref 80.0–100.0)
Monocytes Absolute: 0.3 10*3/uL (ref 0.1–1.0)
Monocytes Relative: 4 %
Neutro Abs: 6.6 10*3/uL (ref 1.7–7.7)
Neutrophils Relative %: 87 %
Platelets: 285 10*3/uL (ref 150–400)
RBC: 2.87 MIL/uL — ABNORMAL LOW (ref 3.87–5.11)
RDW: 24.4 % — ABNORMAL HIGH (ref 11.5–15.5)
WBC: 7.5 10*3/uL (ref 4.0–10.5)
nRBC: 0 % (ref 0.0–0.2)

## 2020-08-29 LAB — GLUCOSE, CAPILLARY
Glucose-Capillary: 110 mg/dL — ABNORMAL HIGH (ref 70–99)
Glucose-Capillary: 115 mg/dL — ABNORMAL HIGH (ref 70–99)
Glucose-Capillary: 117 mg/dL — ABNORMAL HIGH (ref 70–99)
Glucose-Capillary: 118 mg/dL — ABNORMAL HIGH (ref 70–99)
Glucose-Capillary: 128 mg/dL — ABNORMAL HIGH (ref 70–99)
Glucose-Capillary: 146 mg/dL — ABNORMAL HIGH (ref 70–99)
Glucose-Capillary: 148 mg/dL — ABNORMAL HIGH (ref 70–99)
Glucose-Capillary: 96 mg/dL (ref 70–99)

## 2020-08-29 LAB — PHOSPHORUS: Phosphorus: 3 mg/dL (ref 2.5–4.6)

## 2020-08-29 LAB — HEMOGLOBIN AND HEMATOCRIT, BLOOD
HCT: 27.9 % — ABNORMAL LOW (ref 36.0–46.0)
Hemoglobin: 8.8 g/dL — ABNORMAL LOW (ref 12.0–15.0)

## 2020-08-29 LAB — PREPARE RBC (CROSSMATCH)

## 2020-08-29 LAB — MAGNESIUM: Magnesium: 1.7 mg/dL (ref 1.7–2.4)

## 2020-08-29 MED ORDER — SODIUM CHLORIDE 0.9% IV SOLUTION: Freq: Once | INTRAVENOUS | Status: DC

## 2020-08-29 NOTE — Progress Notes (Signed)
Name: Kathy Vega MRN: [id number] DOB: Dec 11, 1975    ADMISSION DATE:  08/13/2020  BRIEF PATIENT DESCRIPTION: 44 yo unvaccinated female admitted with COVID-19 pneumonia and ARDS   SIGNIFICANT EVENTS/STUDIES:  09/16: Pt admitted to ICU requiring Heated HFNC and NRB, however remained in the ER pending ICU bed availability  09/16: CT Head revealed no acute intracranial abnormality 09/17: Pt with worsening hypoxia requiring mechanical intubation 09/18: [facility] refused to accept pt for ECMO 09/21: Attempted to transfer pt to [facility] and [place] [facility] for possible assessment for ECMO, however both hospitals DO NOT have bed availability and are at capacity and pt may or may NOT be an ECMO candidate at this time  09/23: Pt unproned  09/24:  Resumed proning protocol 09/25: Resumed proning protocol  09/26: Pt unproned 9/27-  No onvernight events, patient remains in ARDS on elevated settings. 08/25/20- patient is on maximal settings with sedation. She is on day 11 of her illness with little improvement.  Ive attempted to reach husband via cell no answer, ive successfully found her daughter and spoke with her regarding care plan and she is agreeable but requested for me to speak with [name] and has provided me with secondary phone number [phone] but again only voice mail was reachable.  Have left voicemail and will discuss trache with family when they are available.  08/26/20- patient without overnight events, ive called POA husband again today but was unable to reach him and left voice mail.  08/27/20- patient critically ill with overall poor prognosis, she had desaturation event today and was difficult to improve requiring multiple changes on ventilator by RT.  08/28/20- Patient is weaned to 60% FiO2 on MV.  Have called Garlan Fillers today and updated him. We discussed trache and he is agreeable.  08/29/20- Spoke with husband today, answered questions ,updated on care plan,  he is  ready to speak with ENT regarding trache palcement   PAST MEDICAL HISTORY :   has a past medical history of Anemia and Thyroid disease.  has a past surgical history that includes Leg Surgery and Cholecystectomy. Prior to Admission medications   Medication Sig Start Date End Date Taking? Authorizing Provider  cyclobenzaprine (FLEXERIL) 10 MG tablet Take 1 tablet (10 mg total) by mouth 3 (three) times daily as needed. 03/26/20   Fisher, Linden Dolin, PA-C  ferrous gluconate (FERGON) 324 MG tablet Take 1 tablet (324 mg total) by mouth 2 (two) times daily with a meal. 03/15/18   [name], MD  ferrous sulfate 325 (65 FE) MG EC tablet Take 325 mg by mouth daily.    [provider]  levothyroxine (SYNTHROID, LEVOTHROID) 125 MCG tablet Take 125 mcg by mouth daily before breakfast.    [provider]  traMADol (ULTRAM) 50 MG tablet Take 1 tablet (50 mg total) by mouth every 6 (six) hours as needed. 03/26/20   Caryn Section Linden Dolin, PA-C   No Known Allergies  FAMILY HISTORY:  family history is not on file. SOCIAL HISTORY:  reports that she has never smoked. She has never used smokeless tobacco. She reports that she does not drink alcohol.  REVIEW OF SYSTEMS:  Unable to assess pt mechanically intubated  SUBJECTIVE:  Unable to assess pt mechanically intubated   VITAL SIGNS: Temp:  [96.3 F (35.7 C)-99.9 F (37.7 C)] 97.9 F (36.6 C) (10/02 1718) Pulse Rate:  [70-104] 83 (10/02 1718) Resp:  [10-32] 31 (10/02 1718) BP: (99-128)/(55-75) 108/62 (10/02 1718) SpO2:  [  91 %-99 %] 95 % (10/02 1718) FiO2 (%):  [40 %-60 %] 45 % (10/02 1718) Weight:  [130.1 kg] 130.1 kg (10/02 0435)  PHYSICAL EXAMINATION: Physical examination is limited due to need for PPE/CAPR General: acutely ill appearing female, NAD mechanically ventilated, patient supine Neuro: sedated, PERRL GCS3T HEENT: supple  Cardiovascular: Monitor shows sinus rhythm  Lungs: diminished breath sounds throughout.  No wheezes.   Abdomen: hypoactive BS x4, obese, soft, non distended  Musculoskeletal: 1+ generalized edema  Skin: intact no rashes or lesions present   Recent Labs  Lab 08/24/20 1511 08/25/20 0924 08/26/20 0428  NA 138 136 136  K 3.3* 3.3* 4.0  CL 104 104 101  CO2 _0 BUN 25* 26* 27*  CREATININE 0.70 0.81 0.98  GLUCOSE 162* 140* 150*   Recent Labs  Lab 08/27/20 0446 08/28/20 0644 08/29/20 0414  HGB 7.5* 7.4* 6.6*  HCT 25.1* 23.1* 22.1*  WBC 9.3 8.6 7.5  PLT 278 300 285   No results found.  ASSESSMENT / PLAN:  Acute hypoxic respiratory failure secondary to COVID-19 pneumonia and ARDS Morbid Obesity, possible OSA  Full vent support for now-vent settings reviewed and established VAP bundle implemented SBT once all parameters met  Will keep net negative  Continue IV steroids Completed course of remdesivir, azithromycin, and ceftriaxone  Maintain airborne and contact precautions  As needed bronchodilators (MDI) Vitamin C and zinc Antitussives Trend inflammatory markers Trend WBC and monitor fever curve Trend PCT   Troponin elevation due to demand ischemia Hypertrophic cardiomyopathy Continuous telemetry monitoring  Echo LVEF 45 to 50% Severe concentric LVH Diastolic dysfunction No pressor requirement Diuresis as needed  Mild acute renal failure  Hypokalemia  Hypernatremia  Trend BMP  Replace electrolytes as indicated  Monitor UOP Avoid nephrotoxic medications   Elevated liver enzymes  Trend hepatic panel   Anemia without obvious acute blood loss Trend CBC Monitor for s/sx of bleeding  Transfuse for hgb <7 Received transfusions 9/23  Hyperglycemia  CBG's q4hrs  SSI   Acute encephalopathy secondary to COVID-19  Mechanical ventilation pain/discomfort Maintain RASS goal -1 to -2 Continue versed, precedex, and fentanyl gtts to maintain RASS goal PRN vecuronium for vent dyssynchrony   WUA daily as tolerated   Best Practice: VTE px: subq heparin and  SCD's Diet: continue TF's  Code status: Full code  Disposition: ICU Family: No family at bedside to update at this time    Critical care provider statement:    Critical care time (minutes):  33   Critical care time was exclusive of:  Separately billable procedures and  treating other patients   Critical care was necessary to treat or prevent imminent or  life-threatening deterioration of the following conditions:  acute hypoxemic respiratory failure, multiple comorbid conditions   Critical care was time spent personally by me on the following  activities:  Development of treatment plan with patient or surrogate,  discussions with consultants, evaluation of patient's response to  treatment, examination of patient, obtaining history from patient or  surrogate, ordering and performing treatments and interventions, ordering  and review of laboratory studies and re-evaluation of patient's condition   I assumed direction of critical care for this patient from another  provider in my specialty: no      Ottie Glazier, M.D.  Pulmonary & [place]

## 2020-08-30 LAB — BPAM RBC
Blood Product Expiration Date: 202110052359
Blood Product Expiration Date: 202110292359
ISSUE DATE / TIME: 202110021454
ISSUE DATE / TIME: 202110021650
Unit Type and Rh: 5100
Unit Type and Rh: 9500

## 2020-08-30 LAB — TYPE AND SCREEN
ABO/RH(D): O POS
Antibody Screen: NEGATIVE
Unit division: 0
Unit division: 0

## 2020-08-30 LAB — CBC WITH DIFFERENTIAL/PLATELET
Abs Immature Granulocytes: 0.26 10*3/uL — ABNORMAL HIGH (ref 0.00–0.07)
Basophils Absolute: 0 10*3/uL (ref 0.0–0.1)
Basophils Relative: 0 %
Eosinophils Absolute: 0 10*3/uL (ref 0.0–0.5)
Eosinophils Relative: 0 %
HCT: 29.9 % — ABNORMAL LOW (ref 36.0–46.0)
Hemoglobin: 9.5 g/dL — ABNORMAL LOW (ref 12.0–15.0)
Immature Granulocytes: 3 %
Lymphocytes Relative: 12 %
Lymphs Abs: 0.9 10*3/uL (ref 0.7–4.0)
MCH: 24.5 pg — ABNORMAL LOW (ref 26.0–34.0)
MCHC: 31.8 g/dL (ref 30.0–36.0)
MCV: 77.1 fL — ABNORMAL LOW (ref 80.0–100.0)
Monocytes Absolute: 0.5 10*3/uL (ref 0.1–1.0)
Monocytes Relative: 7 %
Neutro Abs: 5.9 10*3/uL (ref 1.7–7.7)
Neutrophils Relative %: 78 %
Platelets: 311 10*3/uL (ref 150–400)
RBC: 3.88 MIL/uL (ref 3.87–5.11)
RDW: 23.5 % — ABNORMAL HIGH (ref 11.5–15.5)
WBC: 7.6 10*3/uL (ref 4.0–10.5)
nRBC: 0.4 % — ABNORMAL HIGH (ref 0.0–0.2)

## 2020-08-30 LAB — GLUCOSE, CAPILLARY
Glucose-Capillary: 125 mg/dL — ABNORMAL HIGH (ref 70–99)
Glucose-Capillary: 137 mg/dL — ABNORMAL HIGH (ref 70–99)
Glucose-Capillary: 114 mg/dL — ABNORMAL HIGH (ref 70–99)
Glucose-Capillary: 100 mg/dL — ABNORMAL HIGH (ref 70–99)
Glucose-Capillary: 168 mg/dL — ABNORMAL HIGH (ref 70–99)

## 2020-08-30 LAB — MAGNESIUM: Magnesium: 2.1 mg/dL (ref 1.7–2.4)

## 2020-08-30 LAB — PHOSPHORUS: Phosphorus: 4.1 mg/dL (ref 2.5–4.6)

## 2020-08-30 MED ORDER — SCOPOLAMINE 1 MG/3DAYS TD PT72
1.0000 | MEDICATED_PATCH | TRANSDERMAL | Status: DC
  Administered 2020-08-30 – 2020-09-05 (×3): 1.5 mg via TRANSDERMAL
  Filled 2020-08-30 (×5): qty 1

## 2020-08-30 NOTE — Progress Notes (Signed)
Name: KEAISHA SUBLETTE MRN: [id number] DOB: February 09, 1976    ADMISSION DATE:  08/13/2020  BRIEF PATIENT DESCRIPTION: 44 yo unvaccinated female admitted with COVID-19 pneumonia and ARDS   SIGNIFICANT EVENTS/STUDIES:  09/16: Pt admitted to ICU requiring Heated HFNC and NRB, however remained in the ER pending ICU bed availability  09/16: CT Head revealed no acute intracranial abnormality 09/17: Pt with worsening hypoxia requiring mechanical intubation 09/18: [facility] refused to accept pt for ECMO 09/21: Attempted to transfer pt to [facility] and [place] [facility] for possible assessment for ECMO, however both hospitals DO NOT have bed availability and are at capacity and pt may or may NOT be an ECMO candidate at this time  09/23: Pt unproned  09/24:  Resumed proning protocol 09/25: Resumed proning protocol  09/26: Pt unproned 9/27-  No onvernight events, patient remains in ARDS on elevated settings. 08/25/20- patient is on maximal settings with sedation. She is on day 11 of her illness with little improvement.  Ive attempted to reach husband via cell no answer, ive successfully found her daughter and spoke with her regarding care plan and she is agreeable but requested for me to speak with [name] and has provided me with secondary phone number [phone] but again only voice mail was reachable.  Have left voicemail and will discuss trache with family when they are available.  08/26/20- patient without overnight events, ive called POA husband again today but was unable to reach him and left voice mail.  08/27/20- patient critically ill with overall poor prognosis, she had desaturation event today and was difficult to improve requiring multiple changes on ventilator by RT.  08/28/20- Patient is weaned to 60% FiO2 on MV.  Have called Garlan Fillers today and updated him. We discussed trache and he is agreeable.  08/29/20- Spoke with husband today, answered questions ,updated on care plan,  he is  ready to speak with ENT regarding trache palcement 08/30/20- patient had SBT today she was awake and followed verbal communication but after 2 hours had severly labored breathing and thick high volume secretions.  I have met with husband Garlan Fillers and he was able to see patient at bedside we discussed trache. I have updated Dr Tami Ribas regarding trache plan.    PAST MEDICAL HISTORY :   has a past medical history of Anemia and Thyroid disease.  has a past surgical history that includes Leg Surgery and Cholecystectomy. Prior to Admission medications   Medication Sig Start Date End Date Taking? Authorizing Provider  cyclobenzaprine (FLEXERIL) 10 MG tablet Take 1 tablet (10 mg total) by mouth 3 (three) times daily as needed. 03/26/20   Fisher, Linden Dolin, PA-C  ferrous gluconate (FERGON) 324 MG tablet Take 1 tablet (324 mg total) by mouth 2 (two) times daily with a meal. 03/15/18   [name], MD  ferrous sulfate 325 (65 FE) MG EC tablet Take 325 mg by mouth daily.    [provider]  levothyroxine (SYNTHROID, LEVOTHROID) 125 MCG tablet Take 125 mcg by mouth daily before breakfast.    [provider]  traMADol (ULTRAM) 50 MG tablet Take 1 tablet (50 mg total) by mouth every 6 (six) hours as needed. 03/26/20   Caryn Section Linden Dolin, PA-C   No Known Allergies  FAMILY HISTORY:  family history is not on file. SOCIAL HISTORY:  reports that she has never smoked. She has never used smokeless tobacco. She reports that she does not drink alcohol.  REVIEW OF SYSTEMS:  Unable to assess pt mechanically intubated  SUBJECTIVE:  Unable to assess pt mechanically intubated   VITAL SIGNS: Temp:  [97.5 F (36.4 C)-99.7 F (37.6 C)] 98.8 F (37.1 C) (10/03 1600) Pulse Rate:  [76-120] 118 (10/03 1700) Resp:  [15-36] 28 (10/03 1700) BP: (99-157)/(64-100) 121/79 (10/03 1700) SpO2:  [84 %-100 %] 91 % (10/03 1700) FiO2 (%):  [40 %-45 %] 40 % (10/03 1615)  PHYSICAL EXAMINATION: Physical  examination is limited due to need for PPE/CAPR General: acutely ill appearing female, NAD mechanically ventilated, patient supine Neuro: sedated, PERRL GCS3T HEENT: supple  Cardiovascular: Monitor shows sinus rhythm  Lungs: diminished breath sounds throughout.  No wheezes.  Abdomen: hypoactive BS x4, obese, soft, non distended  Musculoskeletal: 1+ generalized edema  Skin: intact no rashes or lesions present   Recent Labs  Lab 08/24/20 1511 08/25/20 0924 08/26/20 0428  NA 138 136 136  K 3.3* 3.3* 4.0  CL 104 104 101  CO2 25 25 28   BUN 25* 26* 27*  CREATININE 0.70 0.81 0.98  GLUCOSE 162* 140* 150*   Recent Labs  Lab 08/28/20 0644 08/28/20 0644 08/29/20 0414 08/29/20 2052 08/30/20 0429  HGB 7.4*   < > 6.6* 8.8* 9.5*  HCT 23.1*   < > 22.1* 27.9* 29.9*  WBC 8.6  --  7.5  --  7.6  PLT 300  --  285  --  311   < > = values in this interval not displayed.   No results found.  ASSESSMENT / PLAN:  Acute hypoxic respiratory failure secondary to COVID-19 pneumonia and ARDS Morbid Obesity, possible OSA  Full vent support for now-vent settings reviewed and established VAP bundle implemented SBT once all parameters met  Will keep net negative  Continue IV steroids Completed course of remdesivir, azithromycin, and ceftriaxone  Maintain airborne and contact precautions  As needed bronchodilators (MDI) Vitamin C and zinc Antitussives Trend inflammatory markers Trend WBC and monitor fever curve Trend PCT  -trache planned - ENT consulted  Troponin elevation due to demand ischemia Hypertrophic cardiomyopathy Continuous telemetry monitoring  Echo LVEF 45 to 50% Severe concentric LVH Diastolic dysfunction No pressor requirement Diuresis as needed  Mild acute renal failure  Hypokalemia  Hypernatremia  Trend BMP  Replace electrolytes as indicated  Monitor UOP Avoid nephrotoxic medications   Elevated liver enzymes  Trend hepatic panel   Anemia without obvious acute  blood loss Trend CBC Monitor for s/sx of bleeding  Transfuse for hgb <7 Received transfusions 9/23  Hyperglycemia  CBG's q4hrs  SSI   Acute encephalopathy secondary to COVID-19  Mechanical ventilation pain/discomfort Maintain RASS goal -1 to -2 Continue versed, precedex, and fentanyl gtts to maintain RASS goal PRN vecuronium for vent dyssynchrony   WUA daily as tolerated   Best Practice: VTE px: subq heparin and SCD's Diet: continue TF's  Code status: Full code  Disposition: ICU Family: No family at bedside to update at this time    Critical care provider statement:    Critical care time (minutes):  33   Critical care time was exclusive of:  Separately billable procedures and  treating other patients   Critical care was necessary to treat or prevent imminent or  life-threatening deterioration of the following conditions:  acute hypoxemic respiratory failure, multiple comorbid conditions   Critical care was time spent personally by me on the following  activities:  Development of treatment plan with patient or surrogate,  discussions with consultants, evaluation of patient's response to  treatment,  examination of patient, obtaining history from patient or  surrogate, ordering and performing treatments and interventions, ordering  and review of laboratory studies and re-evaluation of patient's condition   I assumed direction of critical care for this patient from another  provider in my specialty: no      Ottie Glazier, M.D.  Pulmonary & [place]

## 2020-08-30 NOTE — Consult Note (Signed)
Kathy Vega, Kathy Vega [id number] 12-10-1975 Ottie Glazier, MD  Reason for Consult: Failure to extubate  HPI: Chart reviewed.  Patient with covid pneumonia and ARDS, asked to evaluate for possible trach.    Allergies: No Known Allergies  ROS: Review of systems normal other than 12 systems except per HPI.  PMH:  Past Medical History:  Diagnosis Date  . Anemia   . Thyroid disease     FH: No family history on file.  SH:  Social History   Socioeconomic History  . Marital status: Married    Spouse name: Not on file  . Number of children: Not on file  . Years of education: Not on file  . Highest education level: Not on file  Occupational History  . Not on file  Tobacco Use  . Smoking status: Never Smoker  . Smokeless tobacco: Never Used  Substance and Sexual Activity  . Alcohol use: No  . Drug use: Not on file  . Sexual activity: Not on file  Other Topics Concern  . Not on file  Social History Narrative  . Not on file   Social Determinants of Health   Financial Resource Strain:   . Difficulty of Paying Living Expenses: Not on file  Food Insecurity:   . Worried About Charity fundraiser in the Last Year: Not on file  . Ran Out of Food in the Last Year: Not on file  Transportation Needs:   . Lack of Transportation (Medical): Not on file  . Lack of Transportation (Non-Medical): Not on file  Physical Activity:   . Days of Exercise per Week: Not on file  . Minutes of Exercise per Session: Not on file  Stress:   . Feeling of Stress : Not on file  Social Connections:   . Frequency of Communication with Friends and Family: Not on file  . Frequency of Social Gatherings with Friends and Family: Not on file  . Attends Religious Services: Not on file  . Active Member of Clubs or Organizations: Not on file  . Attends Archivist Meetings: Not on file  . Marital Status: Not on file  Intimate Partner Violence:   . Fear of Current or Ex-Partner: Not on file  .  Emotionally Abused: Not on file  . Physically Abused: Not on file  . Sexually Abused: Not on file    PSH:  Past Surgical History:  Procedure Laterality Date  . CHOLECYSTECTOMY    . LEG SURGERY      Physical  Exam: Intubated but awake, unable to answer questions.  External ears ok.  Morbidly obese, no anterior neck scars noted.    A/P: Spoke with Dr. Loni Muse of ICU team.  She has improved since yesterday, he thinks they may try to extubate her today or tomorrow.  I will wait to hear back from him before posting for the surgery.    ICU time approx 45 mins   Roena Malady 08/30/2020 12:53 PM

## 2020-08-30 NOTE — Progress Notes (Signed)
Assisted tele visit to patient with daughter.  Sigmund Hazel, RN

## 2020-08-30 NOTE — Progress Notes (Signed)
No acute events overnight, vital signs stable.

## 2020-08-31 ENCOUNTER — Inpatient Hospital Stay: Payer: Medicaid Other

## 2020-08-31 LAB — CBC WITH DIFFERENTIAL/PLATELET
Abs Immature Granulocytes: 0.12 10*3/uL — ABNORMAL HIGH (ref 0.00–0.07)
Basophils Absolute: 0 10*3/uL (ref 0.0–0.1)
Basophils Relative: 0 %
Eosinophils Absolute: 0 10*3/uL (ref 0.0–0.5)
Eosinophils Relative: 0 %
HCT: 30.6 % — ABNORMAL LOW (ref 36.0–46.0)
Hemoglobin: 9.7 g/dL — ABNORMAL LOW (ref 12.0–15.0)
Immature Granulocytes: 1 %
Lymphocytes Relative: 7 %
Lymphs Abs: 0.8 10*3/uL (ref 0.7–4.0)
MCH: 24.1 pg — ABNORMAL LOW (ref 26.0–34.0)
MCHC: 31.7 g/dL (ref 30.0–36.0)
MCV: 75.9 fL — ABNORMAL LOW (ref 80.0–100.0)
Monocytes Absolute: 0.5 10*3/uL (ref 0.1–1.0)
Monocytes Relative: 5 %
Neutro Abs: 9.1 10*3/uL — ABNORMAL HIGH (ref 1.7–7.7)
Neutrophils Relative %: 87 %
Platelets: 338 10*3/uL (ref 150–400)
RBC: 4.03 MIL/uL (ref 3.87–5.11)
RDW: 24.9 % — ABNORMAL HIGH (ref 11.5–15.5)
WBC: 10.5 10*3/uL (ref 4.0–10.5)
nRBC: 0.3 % — ABNORMAL HIGH (ref 0.0–0.2)

## 2020-08-31 LAB — BLOOD GAS, ARTERIAL
Acid-Base Excess: 13.1 mmol/L — ABNORMAL HIGH (ref 0.0–2.0)
Bicarbonate: 38.6 mmol/L — ABNORMAL HIGH (ref 20.0–28.0)
FIO2: 0.45
O2 Saturation: 92.5 %
PEEP: 5 cmH2O
Patient temperature: 37
Pressure support: 5 cmH2O
pCO2 arterial: 53 mmHg — ABNORMAL HIGH (ref 32.0–48.0)
pH, Arterial: 7.47 — ABNORMAL HIGH (ref 7.350–7.450)
pO2, Arterial: 61 mmHg — ABNORMAL LOW (ref 83.0–108.0)

## 2020-08-31 LAB — BASIC METABOLIC PANEL
Anion gap: 10 (ref 5–15)
BUN: 18 mg/dL (ref 6–20)
CO2: 34 mmol/L — ABNORMAL HIGH (ref 22–32)
Calcium: 9.2 mg/dL (ref 8.9–10.3)
Chloride: 93 mmol/L — ABNORMAL LOW (ref 98–111)
Creatinine, Ser: 0.81 mg/dL (ref 0.44–1.00)
GFR calc Af Amer: >60 mL/min (ref >60)
GFR calc non Af Amer: >60 mL/min (ref >60)
Glucose, Bld: 159 mg/dL — ABNORMAL HIGH (ref 70–99)
Potassium: 4.5 mmol/L (ref 3.5–5.1)
Sodium: 137 mmol/L (ref 135–145)

## 2020-08-31 LAB — GLUCOSE, CAPILLARY
Glucose-Capillary: 123 mg/dL — ABNORMAL HIGH (ref 70–99)
Glucose-Capillary: 135 mg/dL — ABNORMAL HIGH (ref 70–99)
Glucose-Capillary: 136 mg/dL — ABNORMAL HIGH (ref 70–99)
Glucose-Capillary: 144 mg/dL — ABNORMAL HIGH (ref 70–99)
Glucose-Capillary: 144 mg/dL — ABNORMAL HIGH (ref 70–99)
Glucose-Capillary: 160 mg/dL — ABNORMAL HIGH (ref 70–99)
Glucose-Capillary: 164 mg/dL — ABNORMAL HIGH (ref 70–99)

## 2020-08-31 LAB — PHOSPHORUS: Phosphorus: 4 mg/dL (ref 2.5–4.6)

## 2020-08-31 LAB — MAGNESIUM: Magnesium: 2.2 mg/dL (ref 1.7–2.4)

## 2020-08-31 MED ORDER — LEVOTHYROXINE SODIUM 50 MCG PO TABS
125.0000 ug | ORAL_TABLET | Freq: Every day | ORAL | Status: DC
  Administered 2020-09-01 – 2020-09-03 (×3): 125 ug
  Filled 2020-08-31 (×4): qty 1

## 2020-08-31 NOTE — Progress Notes (Signed)
Name: Kathy Vega MRN: [id number] DOB: 1976-03-26    ADMISSION DATE:  08/13/2020  BRIEF PATIENT DESCRIPTION: 44 yo unvaccinated female admitted with COVID-19 pneumonia and ARDS   SIGNIFICANT EVENTS/STUDIES:  09/16: Pt admitted to ICU requiring Heated HFNC and NRB, however remained in the ER pending ICU bed availability  09/16: CT Head revealed no acute intracranial abnormality 09/17: Pt with worsening hypoxia requiring mechanical intubation 09/18: [facility] refused to accept pt for ECMO 09/21: Attempted to transfer pt to [facility] and [place] [facility] for possible assessment for ECMO, however both hospitals DO NOT have bed availability and are at capacity and pt may or may NOT be an ECMO candidate at this time  09/23: Pt unproned  09/24:  Resumed proning protocol 09/25: Resumed proning protocol  09/26: Pt unproned 9/27-  No onvernight events, patient remains in ARDS on elevated settings. 08/25/20- patient is on maximal settings with sedation. She is on day 11 of her illness with little improvement.  Ive attempted to reach husband via cell no answer, ive successfully found her daughter and spoke with her regarding care plan and she is agreeable but requested for me to speak with [name] and has provided me with secondary phone number [phone] but again only voice mail was reachable.  Have left voicemail and will discuss trache with family when they are available.  08/26/20- patient without overnight events, ive called POA husband again today but was unable to reach him and left voice mail.  08/27/20- patient critically ill with overall poor prognosis, she had desaturation event today and was difficult to improve requiring multiple changes on ventilator by RT.  08/28/20- Patient is weaned to 60% FiO2 on MV.  Have called Garlan Fillers today and updated him. We discussed trache and he is agreeable.  08/29/20- Spoke with husband today, answered questions ,updated on care plan,  he is  ready to speak with ENT regarding trache palcement 08/30/20- patient had SBT today she was awake and followed verbal communication but after 2 hours had severly labored breathing and thick high volume secretions.  I have met with husband Garlan Fillers and he was able to see patient at bedside we discussed trache. I have updated Dr Tami Ribas regarding trache plan.  08/31/20 - no airleak will need trach, discussed with Dr.Vaught.   No Known Allergies   Scheduled Meds: . sodium chloride   Intravenous Once  . vitamin C  500 mg Per Tube Daily  . chlorhexidine gluconate (MEDLINE KIT)  15 mL Mouth Rinse BID  . Chlorhexidine Gluconate Cloth  6 each Topical Daily  . feeding supplement (PROSource TF)  45 mL Per Tube TID  . folic acid  1 mg Intravenous Daily  . free water  200 mL Per Tube Q6H  . furosemide  20 mg Intravenous BID  . heparin  5,000 Units Subcutaneous Q8H  . insulin aspart  0-20 Units Subcutaneous Q4H  . insulin aspart  5 Units Subcutaneous Q4H  . levothyroxine  125 mcg Per Tube Q0600  . mouth rinse  15 mL Mouth Rinse 10 times per day  . multivitamin with minerals  1 tablet Per Tube Daily  . scopolamine  1 patch Transdermal Q72H  . senna-docusate  1 tablet Oral BID  . sodium chloride flush  10-40 mL Intracatheter Q12H  . thiamine injection  100 mg Intravenous Daily  . zinc sulfate  220 mg Per Tube Daily   Continuous Infusions: . sodium chloride Stopped (08/20/20 1018)  .  dexmedetomidine (PRECEDEX) IV infusion 0.8 mcg/kg/hr (08/31/20 2300)  . feeding supplement (VITAL 1.5 CAL) 70 mL/hr at 08/31/20 2300  . fentaNYL infusion INTRAVENOUS 50 mcg/hr (08/31/20 2300)   PRN Meds:.guaiFENesin-dextromethorphan, LORazepam, ondansetron **OR** ondansetron (ZOFRAN) IV, polyethylene glycol, sodium chloride flush, vecuronium   REVIEW OF SYSTEMS:  Unable to assess pt intubated and mechanically ventilated  SUBJECTIVE:  Unable to assess intubated and mechanically ventilated  VITAL  SIGNS: Temp:  [98.5 F (36.9 C)-98.8 F (37.1 C)] 98.5 F (36.9 C) (10/04 1200) Pulse Rate:  [85-114] 106 (10/04 1300) Resp:  [22-29] 22 (10/04 1300) BP: (88-145)/(53-115) 109/64 (10/04 1300) SpO2:  [91 %-95 %] 91 % (10/04 1300) FiO2 (%):  [45 %-50 %] 50 % (10/04 1300)  PHYSICAL EXAMINATION:  General: Obese female, intubated, tolerating SBT, mechanically ventilated, patient supine Neuro: sedated, PERRL, opens eyes to name, tracks, follows simple commands HEENT: Orotracheally intubated, OG in place, no air leak when ETT tube cuff deflated Cardiovascular: Monitor shows sinus rhythm, RRR, no rubs, murmurs or gallops heard. Lungs: diminished breath sounds throughout.  No wheezes.  Abdomen: hypoactive BS x4, obese, soft, non distended  Musculoskeletal: 1+ generalized edema  Skin: intact no rashes or lesions present   Recent Labs  Lab 08/25/20 0924 08/26/20 0428 08/31/20 0455  NA 136 136 137  K 3.3* 4.0 4.5  CL 104 101 93*  CO2 25 28 34*  BUN 26* 27* 18  CREATININE 0.81 0.98 0.81  GLUCOSE 140* 150* 159*   Recent Labs  Lab 08/29/20 0414 08/29/20 0414 08/29/20 2052 08/30/20 0429 08/31/20 0455  HGB 6.6*   < > 8.8* 9.5* 9.7*  HCT 22.1*   < > 27.9* 29.9* 30.6*  WBC 7.5  --   --  7.6 10.5  PLT 285  --   --  311 338   < > = values in this interval not displayed.   DG Chest Port 1 View  Result Date: 08/31/2020 CLINICAL DATA:  Acute respiratory failure. EXAM: PORTABLE CHEST 1 VIEW COMPARISON:  08/25/2020 FINDINGS: 0513 hours. Endotracheal tube tip is 1.6 cm above the base of the carina. NG tube tip is in the mid stomach. Left PICC line tip overlies the mid to distal SVC. Lung volumes are low with patchy bilateral airspace disease, right greater than left and similar to prior. The cardio pericardial silhouette is enlarged. Telemetry leads overlie the chest. IMPRESSION: Low volume film with cardiomegaly and persistent diffuse bilateral airspace disease, right greater than left. No  substantial interval change. Electronically Signed   By: Misty Stanley M.D.   On: 08/31/2020 07:49    ASSESSMENT / PLAN:  Acute hypoxic respiratory failure secondary to COVID-19 pneumonia and ARDS Morbid Obesity, possible OSA  Full vent support for now-vent settings reviewed and established VAP bundle, continue  SBT, tolerating Will keep net negative  Continue IV steroids Completed course of remdesivir, azithromycin, and ceftriaxone  Off of airborne and contact precautions As needed bronchodilators (MDI) Vitamin C and zinc Will need trach at least short-term due to body habitus, no air leak Reassess for air leak daily Discussed with ENT Discussed with RT  Troponin elevation due to demand ischemia Hypertrophic cardiomyopathy Continuous telemetry monitoring  Echo LVEF 45 to 50% Severe concentric LVH Diastolic dysfunction No pressor requirement Diuresis as needed and tolerated  Mild acute renal failure -resolved Hypokalemia -resolved Hypernatremia -resolved Trend BMP  Replace electrolytes as indicated  Monitor UOP Avoid nephrotoxic medications   Elevated liver enzymes  Trend hepatic panel   Anemia  without obvious acute blood loss Trend CBC Monitor for s/sx of bleeding  Transfuse for hgb <7 Received transfusions 9/23  Hyperglycemia  CBG's q4hrs  SSI   Acute encephalopathy secondary to COVID-19  Mechanical ventilation pain/discomfort Maintain RASS goal 0 to -1 Continue precedex, and fentanyl gtts to maintain RASS goal WUA daily as tolerated   Best Practice: VTE px: subq heparin and SCD's Diet: continue TF's  Code status: Full code  Disposition: ICU    Total critical care time 35 minutes    C. Derrill Kay, MD [facility]   *This note was dictated using voice recognition software/Dragon.  Despite best efforts to proofread, errors can occur which can change the meaning.  Any change was purely unintentional.

## 2020-08-31 NOTE — Plan of Care (Signed)
Pt progressing toward goal, tolerated weaning today for 5hrs but has no cuff leak when checked by RT, MD was notified , stated to restart low sedation, Pt was waned off fentanyl but continued on precedex  at 0.24mcg. tolerating it well. Pt awake alert and nodding appropriately  to questions and command. Family were updated today BY DR G. Daughter visited pt .

## 2020-09-01 DIAGNOSIS — U071 COVID-19: Secondary | ICD-10-CM | POA: Diagnosis not present

## 2020-09-01 DIAGNOSIS — J80 Acute respiratory distress syndrome: Secondary | ICD-10-CM | POA: Diagnosis not present

## 2020-09-01 LAB — CBC WITH DIFFERENTIAL/PLATELET
Abs Immature Granulocytes: 0.08 10*3/uL — ABNORMAL HIGH (ref 0.00–0.07)
Basophils Absolute: 0 10*3/uL (ref 0.0–0.1)
Basophils Relative: 0 %
Eosinophils Absolute: 0 10*3/uL (ref 0.0–0.5)
Eosinophils Relative: 0 %
HCT: 31.8 % — ABNORMAL LOW (ref 36.0–46.0)
Hemoglobin: 9.7 g/dL — ABNORMAL LOW (ref 12.0–15.0)
Immature Granulocytes: 1 %
Lymphocytes Relative: 7 %
Lymphs Abs: 0.8 10*3/uL (ref 0.7–4.0)
MCH: 23.7 pg — ABNORMAL LOW (ref 26.0–34.0)
MCHC: 30.5 g/dL (ref 30.0–36.0)
MCV: 77.6 fL — ABNORMAL LOW (ref 80.0–100.0)
Monocytes Absolute: 0.6 10*3/uL (ref 0.1–1.0)
Monocytes Relative: 6 %
Neutro Abs: 8.7 10*3/uL — ABNORMAL HIGH (ref 1.7–7.7)
Neutrophils Relative %: 86 %
Platelets: 358 10*3/uL (ref 150–400)
RBC: 4.1 MIL/uL (ref 3.87–5.11)
RDW: 25.2 % — ABNORMAL HIGH (ref 11.5–15.5)
Smear Review: NORMAL
WBC: 10.2 10*3/uL (ref 4.0–10.5)
nRBC: 0 % (ref 0.0–0.2)

## 2020-09-01 LAB — GLUCOSE, CAPILLARY
Glucose-Capillary: 136 mg/dL — ABNORMAL HIGH (ref 70–99)
Glucose-Capillary: 166 mg/dL — ABNORMAL HIGH (ref 70–99)
Glucose-Capillary: 135 mg/dL — ABNORMAL HIGH (ref 70–99)
Glucose-Capillary: 172 mg/dL — ABNORMAL HIGH (ref 70–99)
Glucose-Capillary: 216 mg/dL — ABNORMAL HIGH (ref 70–99)

## 2020-09-01 LAB — BASIC METABOLIC PANEL
Anion gap: 12 (ref 5–15)
BUN: 19 mg/dL (ref 6–20)
CO2: 33 mmol/L — ABNORMAL HIGH (ref 22–32)
Calcium: 9.5 mg/dL (ref 8.9–10.3)
Chloride: 91 mmol/L — ABNORMAL LOW (ref 98–111)
Creatinine, Ser: 0.84 mg/dL (ref 0.44–1.00)
GFR calc Af Amer: >60 mL/min (ref >60)
GFR calc non Af Amer: >60 mL/min (ref >60)
Glucose, Bld: 131 mg/dL — ABNORMAL HIGH (ref 70–99)
Potassium: 4.3 mmol/L (ref 3.5–5.1)
Sodium: 136 mmol/L (ref 135–145)

## 2020-09-01 LAB — PHOSPHORUS: Phosphorus: 4.4 mg/dL (ref 2.5–4.6)

## 2020-09-01 LAB — MAGNESIUM: Magnesium: 2.2 mg/dL (ref 1.7–2.4)

## 2020-09-01 MED ORDER — THIAMINE HCL 100 MG PO TABS
100.0000 mg | ORAL_TABLET | Freq: Every day | ORAL | Status: DC
  Administered 2020-09-02 – 2020-09-03 (×2): 100 mg via ORAL
  Filled 2020-09-01 (×2): qty 1

## 2020-09-01 MED ORDER — MIDAZOLAM HCL 2 MG/2ML IJ SOLN
2.0000 mg | INTRAMUSCULAR | Status: DC | PRN
  Administered 2020-09-01 – 2020-09-02 (×4): 2 mg via INTRAVENOUS
  Filled 2020-09-01 (×3): qty 2

## 2020-09-01 MED ORDER — FAMOTIDINE 20 MG PO TABS
20.0000 mg | ORAL_TABLET | Freq: Two times a day (BID) | ORAL | Status: DC
  Administered 2020-09-01 – 2020-09-12 (×16): 20 mg via ORAL
  Filled 2020-09-01 (×18): qty 1

## 2020-09-01 MED ORDER — FOLIC ACID 1 MG PO TABS
1.0000 mg | ORAL_TABLET | Freq: Every day | ORAL | Status: DC
  Administered 2020-09-02 – 2020-09-03 (×2): 1 mg via ORAL
  Filled 2020-09-01 (×2): qty 1

## 2020-09-01 MED ORDER — FENTANYL CITRATE (PF) 100 MCG/2ML IJ SOLN
50.0000 ug | INTRAMUSCULAR | Status: DC | PRN
  Administered 2020-09-01: 50 ug via INTRAVENOUS

## 2020-09-01 MED ORDER — MORPHINE SULFATE (PF) 2 MG/ML IV SOLN
2.0000 mg | INTRAVENOUS | Status: DC | PRN
  Administered 2020-09-01 – 2020-09-04 (×8): 2 mg via INTRAVENOUS
  Filled 2020-09-01 (×8): qty 1

## 2020-09-01 MED ORDER — FENTANYL CITRATE (PF) 100 MCG/2ML IJ SOLN
50.0000 ug | INTRAMUSCULAR | Status: DC | PRN
  Administered 2020-09-01: 100 ug via INTRAVENOUS
  Filled 2020-09-01 (×2): qty 2

## 2020-09-01 MED ORDER — DEXAMETHASONE SODIUM PHOSPHATE 10 MG/ML IJ SOLN
6.0000 mg | Freq: Three times a day (TID) | INTRAMUSCULAR | Status: DC
  Administered 2020-09-01: 6 mg via INTRAVENOUS
  Filled 2020-09-01 (×3): qty 0.6

## 2020-09-01 MED ORDER — MIDAZOLAM HCL 2 MG/2ML IJ SOLN
2.0000 mg | INTRAMUSCULAR | Status: DC | PRN
  Filled 2020-09-01: qty 2

## 2020-09-01 NOTE — Progress Notes (Signed)
Name: Kathy Vega MRN: [id number] DOB: 1976-10-23    ADMISSION DATE:  08/13/2020  BRIEF PATIENT DESCRIPTION: 44 yo unvaccinated female admitted with COVID-19 pneumonia and ARDS   SIGNIFICANT EVENTS/STUDIES:  09/16: Pt admitted to ICU requiring Heated HFNC and NRB, however remained in the ER pending ICU bed availability  09/16: CT Head revealed no acute intracranial abnormality 09/17: Pt with worsening hypoxia requiring mechanical intubation 09/18: [facility] refused to accept pt for ECMO 09/21: Attempted to transfer pt to [facility] [place] [facility] for possible assessment for ECMO, however both hospitals DO NOT have bed availability and are at capacity and pt may or may NOT be an ECMO candidate at this time  09/23: Pt unproned  09/24:  Resumed proning protocol 09/25: Resumed proning protocol  09/26: Pt unproned 9/27-  No onvernight events, patient remains in ARDS on elevated settings. 08/25/20- patient is on maximal settings with sedation. She is on day 11 of her illness with little improvement.  Ive attempted to reach husband via cell no answer, ive successfully found her daughter and spoke with her regarding care plan and she is agreeable but requested for me to speak with [name] and has provided me with secondary phone number [phone] but again only voice mail was reachable.  Have left voicemail and will discuss trache with family when they are available.  08/26/20- patient without overnight events, ive called POA husband again today but was unable to reach him and left voice mail.  08/27/20- patient critically ill with overall poor prognosis, she had desaturation event today and was difficult to improve requiring multiple changes on ventilator by RT.  08/28/20- Patient is weaned to 60% FiO2 on MV.  Have called Garlan Fillers today and updated him. We discussed trache and he is agreeable.  08/29/20- Spoke with husband today, answered questions ,updated on care plan,  he is  ready to speak with ENT regarding trache palcement 08/30/20- patient had SBT today she was awake and followed verbal communication but after 2 hours had severly labored breathing and thick high volume secretions.  I have met with husband Garlan Fillers and he was able to see patient at bedside we discussed trache. I have updated Dr Tami Ribas regarding trache plan.  08/31/20 - no airleak will need trach, discussed with Dr.Vaught. 09/01/20-Still no air leak. Patient has trach scheduled for Friday of this week and thus no need for steroids at this time   No Known Allergies   Scheduled Meds: . sodium chloride   Intravenous Once  . vitamin C  500 mg Per Tube Daily  . chlorhexidine gluconate (MEDLINE KIT)  15 mL Mouth Rinse BID  . Chlorhexidine Gluconate Cloth  6 each Topical Daily  . dexamethasone (DECADRON) injection  6 mg Intravenous Q8H  . famotidine  20 mg Oral BID  . feeding supplement (PROSource TF)  45 mL Per Tube TID  . [START ON 16/01/8465] folic acid  1 mg Oral Daily  . free water  200 mL Per Tube Q6H  . furosemide  20 mg Intravenous BID  . heparin  5,000 Units Subcutaneous Q8H  . insulin aspart  0-20 Units Subcutaneous Q4H  . insulin aspart  5 Units Subcutaneous Q4H  . levothyroxine  125 mcg Per Tube Q0600  . mouth rinse  15 mL Mouth Rinse 10 times per day  . multivitamin with minerals  1 tablet Per Tube Daily  . scopolamine  1 patch Transdermal Q72H  . senna-docusate  1 tablet  Oral BID  . sodium chloride flush  10-40 mL Intracatheter Q12H  . [START ON 09/02/2020] thiamine  100 mg Oral Daily  . zinc sulfate  220 mg Per Tube Daily   Continuous Infusions: . sodium chloride 250 mL (09/01/20 1001)  . dexmedetomidine (PRECEDEX) IV infusion 0.7 mcg/kg/hr (09/01/20 1003)  . feeding supplement (VITAL 1.5 CAL) 70 mL/hr at 09/01/20 0600  . fentaNYL infusion INTRAVENOUS Stopped (09/01/20 0800)   PRN Meds:.fentaNYL (SUBLIMAZE) injection, fentaNYL (SUBLIMAZE) injection,  guaiFENesin-dextromethorphan, midazolam, midazolam, ondansetron **OR** ondansetron (ZOFRAN) IV, polyethylene glycol, sodium chloride flush, vecuronium   REVIEW OF SYSTEMS:  Unable to assess pt intubated and mechanically ventilated  SUBJECTIVE:  Unable to assess intubated and mechanically ventilated  VITAL SIGNS: Temp:  [98.5 F (36.9 C)-99.6 F (37.6 C)] 99.2 F (37.3 C) (10/05 0800) Pulse Rate:  [86-118] 93 (10/05 1000) Resp:  [21-30] 23 (10/05 1000) BP: (94-145)/(54-115) 99/56 (10/05 1000) SpO2:  [90 %-100 %] 94 % (10/05 1000) FiO2 (%):  [45 %-50 %] 45 % (10/05 0838) Weight:  [123.1 kg] 123.1 kg (10/05 0500)  PHYSICAL EXAMINATION:  General: Obese female, intubated, tolerating SBT, mechanically ventilated, patient supine Neuro: sedated, PERRL, opens eyes to name, tracks, follows simple commands HEENT: Orotracheally intubated, OG in place, no air leak when ETT tube cuff deflated at bedisde today Cardiovascular: Monitor shows sinus rhythm, RRR, no rubs, murmurs or gallops heard. Lungs: diminished breath sounds throughout.  No wheezes.  Abdomen: hypoactive BS x4, obese, soft, non distended  Musculoskeletal: 1+ generalized edema  Skin: intact no rashes or lesions present   Recent Labs  Lab 08/26/20 0428 08/31/20 0455 09/01/20 0448  NA 136 137 136  K 4.0 4.5 4.3  CL 101 93* 91*  CO2 28 34* 33*  BUN 27* 18 19  CREATININE 0.98 0.81 0.84  GLUCOSE 150* 159* 131*   Recent Labs  Lab 08/30/20 0429 08/31/20 0455 09/01/20 0448  HGB 9.5* 9.7* 9.7*  HCT 29.9* 30.6* 31.8*  WBC 7.6 10.5 10.2  PLT 311 338 358   DG Chest Port 1 View  Result Date: 08/31/2020 CLINICAL DATA:  Acute respiratory failure. EXAM: PORTABLE CHEST 1 VIEW COMPARISON:  08/25/2020 FINDINGS: 0513 hours. Endotracheal tube tip is 1.6 cm above the base of the carina. NG tube tip is in the mid stomach. Left PICC line tip overlies the mid to distal SVC. Lung volumes are low with patchy bilateral airspace disease,  right greater than left and similar to prior. The cardio pericardial silhouette is enlarged. Telemetry leads overlie the chest. IMPRESSION: Low volume film with cardiomegaly and persistent diffuse bilateral airspace disease, right greater than left. No substantial interval change. Electronically Signed   By: Misty Stanley M.D.   On: 08/31/2020 07:49    ASSESSMENT / PLAN:  Acute hypoxic respiratory failure secondary to COVID-19 pneumonia and ARDS Morbid Obesity, possible OSA  Full vent support for now-vent settings reviewed and established VAP bundle, continue  SBT, tolerating Will keep net negative  Continue IV steroids Completed course of remdesivir, azithromycin, and ceftriaxone  Off of airborne and contact precautions As needed bronchodilators (MDI) Vitamin C and zinc Trach schedule for this Friday with ENT Reassess for air leak daily Discussed with ENT Discussed with RT  Troponin elevation due to demand ischemia Hypertrophic cardiomyopathy Continuous telemetry monitoring  Echo LVEF 45 to 50% Severe concentric LVH Diastolic dysfunction No pressor requirement Diuresis as needed and tolerated  Mild acute renal failure -resolved Hypokalemia -resolved Hypernatremia -resolved Trend BMP  Replace electrolytes  as indicated  Monitor UOP Avoid nephrotoxic medications   Elevated liver enzymes  Trend hepatic panel   Anemia without obvious acute blood loss Trend CBC Monitor for s/sx of bleeding  Transfuse for hgb <7 Received transfusions 9/23  Hyperglycemia  CBG's q4hrs  SSI   Acute encephalopathy secondary to COVID-19  Mechanical ventilation pain/discomfort Maintain RASS goal 0 to -1 Continue precedex. Wean as tolerated WUA daily as tolerated   Best Practice: VTE px: subq heparin and SCD's Diet: continue TF's  Code status: Full code  Disposition: ICU    Total critical care time 35 minutes

## 2020-09-01 NOTE — Progress Notes (Signed)
Assisted tele visit to patient with family member.  Markisha Meding D Elissia Spiewak, RN   

## 2020-09-02 LAB — BASIC METABOLIC PANEL
Anion gap: 11 (ref 5–15)
BUN: 21 mg/dL — ABNORMAL HIGH (ref 6–20)
CO2: 33 mmol/L — ABNORMAL HIGH (ref 22–32)
Calcium: 9.5 mg/dL (ref 8.9–10.3)
Chloride: 94 mmol/L — ABNORMAL LOW (ref 98–111)
Creatinine, Ser: 0.82 mg/dL (ref 0.44–1.00)
GFR calc non Af Amer: >60 mL/min (ref >60)
Glucose, Bld: 156 mg/dL — ABNORMAL HIGH (ref 70–99)
Potassium: 4.1 mmol/L (ref 3.5–5.1)
Sodium: 138 mmol/L (ref 135–145)

## 2020-09-02 LAB — CBC WITH DIFFERENTIAL/PLATELET
Abs Immature Granulocytes: 0.08 10*3/uL — ABNORMAL HIGH (ref 0.00–0.07)
Basophils Absolute: 0 10*3/uL (ref 0.0–0.1)
Basophils Relative: 0 %
Eosinophils Absolute: 0 10*3/uL (ref 0.0–0.5)
Eosinophils Relative: 0 %
HCT: 30.8 % — ABNORMAL LOW (ref 36.0–46.0)
Hemoglobin: 9.7 g/dL — ABNORMAL LOW (ref 12.0–15.0)
Immature Granulocytes: 1 %
Lymphocytes Relative: 12 %
Lymphs Abs: 1.2 10*3/uL (ref 0.7–4.0)
MCH: 24.3 pg — ABNORMAL LOW (ref 26.0–34.0)
MCHC: 31.5 g/dL (ref 30.0–36.0)
MCV: 77 fL — ABNORMAL LOW (ref 80.0–100.0)
Monocytes Absolute: 0.7 10*3/uL (ref 0.1–1.0)
Monocytes Relative: 7 %
Neutro Abs: 8 10*3/uL — ABNORMAL HIGH (ref 1.7–7.7)
Neutrophils Relative %: 80 %
Platelets: 329 10*3/uL (ref 150–400)
RBC: 4 MIL/uL (ref 3.87–5.11)
RDW: 25.8 % — ABNORMAL HIGH (ref 11.5–15.5)
Smear Review: NORMAL
WBC: 9.9 10*3/uL (ref 4.0–10.5)
nRBC: 0 % (ref 0.0–0.2)

## 2020-09-02 LAB — PHOSPHORUS: Phosphorus: 3.9 mg/dL (ref 2.5–4.6)

## 2020-09-02 LAB — GLUCOSE, CAPILLARY
Glucose-Capillary: 135 mg/dL — ABNORMAL HIGH (ref 70–99)
Glucose-Capillary: 137 mg/dL — ABNORMAL HIGH (ref 70–99)
Glucose-Capillary: 139 mg/dL — ABNORMAL HIGH (ref 70–99)
Glucose-Capillary: 141 mg/dL — ABNORMAL HIGH (ref 70–99)
Glucose-Capillary: 144 mg/dL — ABNORMAL HIGH (ref 70–99)
Glucose-Capillary: 151 mg/dL — ABNORMAL HIGH (ref 70–99)
Glucose-Capillary: 170 mg/dL — ABNORMAL HIGH (ref 70–99)

## 2020-09-02 LAB — MAGNESIUM: Magnesium: 2.3 mg/dL (ref 1.7–2.4)

## 2020-09-02 NOTE — Progress Notes (Signed)
NAME:  Kathy Vega, MRN:  [id number], DOB:  08/24/1976, LOS: 45 ADMISSION DATE:  08/13/2020, CONSULTATION DATE:  08/13/2020  Brief History   44 yo unvaccinated female admitted with COVID-19 pneumonia & ARDS requiring intubation and mechanical ventilation.  Past Medical History  Thyroid disease Anemia  Significant Hospital Events   09/16: Pt admitted to ICU requiring Heated HFNC and NRB, however remained in the ER pending ICU bed availability  09/16: CT Head revealed no acute intracranial abnormality 09/17: Pt with worsening hypoxia requiring mechanical intubation 09/18: [facility] refused to accept pt for ECMO 09/21: Attempted to transfer pt to [facility] [place] [facility] for possible assessment for ECMO, however both hospitals DO NOT have bed availability and are at capacity and pt may or may NOT be an ECMO candidate at this time  09/23: Pt unproned  09/24:  Resumed proning protocol 09/25: Resumed proning protocol  09/26: Pt unproned 9/27-  No onvernight events, patient remains in ARDS on elevated settings. 08/25/20- patient is on maximal settings with sedation. She is on day 11 of her illness with little improvement.  Ive attempted to reach husband via cell no answer, ive successfully found her daughter and spoke with her regarding care plan and she is agreeable but requested for me to speak with [name] and has provided me with secondary phone number [phone] but again only voice mail was reachable.  Have left voicemail and will discuss trache with family when they are available.  08/26/20- patient without overnight events, ive called POA husband again today but was unable to reach him and left voice mail.  08/27/20- patient critically ill with overall poor prognosis, she had desaturation event today and was difficult to improve requiring multiple changes on ventilator by RT.  08/28/20- Patient is weaned to 60% FiO2 on MV.  Have called Garlan Fillers today and updated him. We  discussed trache and he is agreeable.  08/29/20- Spoke with husband today, answered questions ,updated on care plan,  he is ready to speak with ENT regarding trache palcement 08/30/20- patient had SBT today she was awake and followed verbal communication but after 2 hours had severly labored breathing and thick high volume secretions.  I have met with husband Garlan Fillers and he was able to see patient at bedside we discussed trache. I have updated Dr Tami Ribas regarding trache plan.  08/31/20 - no airleak will need trach, discussed with Dr.Vaught. 09/01/20-Still no air leak. Patient has trach scheduled for Friday of this week and thus no need for steroids at this time 09/02/20- no air leak, continuing plan of care, no other concerns Consults:  Surgery  Procedures:  ETT 9/17 >> PICC 9/17 >>  Significant Diagnostic Tests:    Micro Data:  MRSA PCR 9/16 >> negative BC 9/16 >> negative  Antimicrobials:  Metronidazole 9/24 >> 9/29   Interim history/subjective:  Patient resting, appearing comfortable on mechanical ventilation with precedex drip.  Overnight- no concerns per care RN Labs/Imaging personally reviewed  Net - 1.6 L (+ 4.7 L since admit) TMAX/ WBC: 38.1/ 9.9 Serum Co2: 33 BUN/ Cr.: 21/ 0.82 Hgb: 9.7  Objective   Blood pressure 122/73, pulse 79, temperature 99.9 F (37.7 C), temperature source Axillary, resp. rate (!) 22, height 5' 9.13" (1.756 m), weight 123 kg, SpO2 94 %.    Vent Mode: PRVC;SIMV FiO2 (%):  [45 %-50 %] 45 % Set Rate:  [12 bmp] 12 bmp Vt Set:  [420 mL] 420 mL PEEP:  [5  cmH20] 5 cmH20 Pressure Support:  [10 cmH20] 10 cmH20   Intake/Output Summary (Last 24 hours) at 09/02/2020 0916 Last data filed at 09/02/2020 9242 Gross per 24 hour  Intake 2811.97 ml  Output 4550 ml  Net -1738.03 ml   Filed Weights   08/29/20 0435 09/01/20 0500 09/02/20 0419  Weight: 130.1 kg 123.1 kg 123 kg    Examination: General: Adult female, lying in bed, mechanically  ventilated with precedex drip HEENT: MM pink/moist, anicteric Neuro: Alert, responsive/ able to follow commands, MAE- generalized weakness, PERRL CV: s1s2, RRR, NSR on monitor, pulses + 2 R/P, no murmurs/rubs/gallops Pulm: regular, non labored on SIMV (PRVC + PS), coarse bilateral breath sounds GI: soft, rounded, bs x 4 Skin: known blisters on buttocks, no rashes/lesions noted Extremities: warm/dry, +1 trace generalized edema noted  Resolved Hospital Problem list   - Off of airborne/ contact precautions - Elevated Liver Enzymes  Assessment & Plan:   Acute hypoxic respiratory failure secondary to COVID-19 pneumonia and ARDS Morbid Obesity, possible OSA  S/p course of remdesevir, azithromycin & ceftriaxone. Off airborne precautions.  - Ventilator settings established: SIMV, 23m/kg - SBT: pt has tolerated 5/5 but due to no air leak plan for Trach 10/8 - RASS goal: 0, -1 - wean precedex as tolerated - continue vitamin C & zinc - assess cuff leak daily  Troponin elevation due to demand ischemia Hypertrophic cardiomyopathy Echo: LVEF 45-50% with severe concentric LVH, diastolic dysfunction  - no pressor requirement  - continuous cardiac monitoring - continue Lasix BID as tolerated  Mild acute renal failure -resolved Hypokalemia -resolved Hypernatremia -resolved  - Daily BMET, replace electrolytes PRN - strict I/O - avoid nephrotoxic agents, ensure adequate renal perfusion  Anemia without obvious acute blood loss  - daily CBC - monitor for s/s of bleeding - transfuse for Hgb < 7 - last transfusions 9/23  Hyperglycemia   - Q 4 CBG - SSI   Best practice:   Diet: NPO with TF Pain/Anxiety/Delirium protocol (if indicated): Precedex & Fentanyl VAP protocol (if indicated): established DVT prophylaxis: Heparin SQ & SCDs GI prophylaxis: Pepcid Glucose control: Q 4 with SSI, target range 140-180 Mobility: Bedrest Code Status: FULL Family Communication: Husband  updated bedside- 10/6 Disposition: ICU  Labs   CBC: Recent Labs  Lab 08/29/20 0414 08/29/20 0414 08/29/20 2052 08/30/20 0429 08/31/20 0455 09/01/20 0448 09/02/20 0528  WBC 7.5  --   --  7.6 10.5 10.2 9.9  NEUTROABS 6.6  --   --  5.9 9.1* 8.7* 8.0*  HGB 6.6*   < > 8.8* 9.5* 9.7* 9.7* 9.7*  HCT 22.1*   < > 27.9* 29.9* 30.6* 31.8* 30.8*  MCV 77.0*  --   --  77.1* 75.9* 77.6* 77.0*  PLT 285  --   --  311 338 358 329   < > = values in this interval not displayed.    Basic Metabolic Panel: Recent Labs  Lab 08/29/20 0414 08/30/20 0429 08/31/20 0455 09/01/20 0448 09/02/20 0528  NA  --   --  137 136 138  K  --   --  4.5 4.3 4.1  CL  --   --  93* 91* 94*  CO2  --   --  34* 33* 33*  GLUCOSE  --   --  159* 131* 156*  BUN  --   --  18 19 21*  CREATININE  --   --  0.81 0.84 0.82  CALCIUM  --   --  9.2  9.5 9.5  MG 1.7 2.1 2.2 2.2 2.3  PHOS 3.0 4.1 4.0 4.4 3.9   GFR: Estimated Creatinine Clearance: 123.1 mL/min (by C-G formula based on SCr of 0.82 mg/dL). Recent Labs  Lab 08/30/20 0429 08/31/20 0455 09/01/20 0448 09/02/20 0528  WBC 7.6 10.5 10.2 9.9    Liver Function Tests: No results for input(s): AST, ALT, ALKPHOS, BILITOT, PROT, ALBUMIN in the last 168 hours. No results for input(s): LIPASE, AMYLASE in the last 168 hours. No results for input(s): AMMONIA in the last 168 hours.  ABG    Component Value Date/Time   PHART 7.47 (H) 08/31/2020 0800   PCO2ART 53 (H) 08/31/2020 0800   PO2ART 61 (L) 08/31/2020 0800   HCO3 38.6 (H) 08/31/2020 0800   O2SAT 92.5 08/31/2020 0800     Coagulation Profile: No results for input(s): INR, PROTIME in the last 168 hours.  Cardiac Enzymes: No results for input(s): CKTOTAL, CKMB, CKMBINDEX, TROPONINI in the last 168 hours.  HbA1C: No results found for: HGBA1C  CBG: Recent Labs  Lab 09/01/20 1639 09/01/20 1952 09/02/20 0000 09/02/20 0413 09/02/20 0809  GLUCAP 172* 216* 144* 170* 139*     Critical care time: 45  minutes     Domingo Pulse [place], AGACNP-BC [facility]    Please see Amion for pager details.

## 2020-09-02 NOTE — Progress Notes (Signed)
Pt alert but drowsy. Follows commands and is able to point out some of her physical needs.

## 2020-09-02 NOTE — Progress Notes (Signed)
Nutrition Follow-up  DOCUMENTATION CODES:   Obesity unspecified  INTERVENTION:  Continue Vital 1.5 Cal at 70 mL/hr (1680 mL goal daily volume) + PROSource TF 45 mL TID. Provides 2640 kcal, 146 grams of protein, 1277 mL H2O daily.  NUTRITION DIAGNOSIS:   Inadequate oral intake related to inability to eat as evidenced by NPO status.  Ongoing.  GOAL:   Patient will meet greater than or equal to 90% of their needs  Met with TF regimen.  MONITOR:   Vent status, Labs, Weight trends, TF tolerance, I & O's  REASON FOR ASSESSMENT:   Ventilator, Consult Enteral/tube feeding initiation and management  ASSESSMENT:   44 year old female admitted with COVID-19 PNA.  9/17 intubated 9/17 proning protocol initiated  Patient is currently intubated on ventilator support MV: 9.9 L/min Temp (24hrs), Avg:99.7 F (37.6 C), Min:98.7 F (37.1 C), Max:100.5 F (38.1 C)  Medications reviewed and include: vitamin C 500 mg daily per tube, famotidine, folic acid 1 mg daily, free water 200 mL Q6hrs, Lasix 20 mg BID IV, Novolog 0-20 units Q4hrs, Novolog 5 units Q4hrs, levothyroxine, MVI, senna-docusate 1 tablet BID, thiamine 100 mg daily, zinc sulfate 220 mg daily, Precedex gtt.  Labs reviewed: CBG 139-216, Chloride 94, CO2 33, BUN 21.  I/O: 4550 mL UOP yesterday (1.5 mL/kg/hr)  Weight trend: 123 kg on 10/6; +3.8 kg from 9/16; wt got up to 133.2 kg on 9/29 but has now decreased with diuresis and is fairly stable from admission wt  Enteral Access: 18 Fr. OGT placed 9/17; terminates in mid stomach per chest x-ray 10/4  TF regimen: Vital 1.5 Cal at 70 mL/hr + PROSource TF 45 mL TID  Discussed with RN. Plan is for tracheostomy tube placement on 10/8. Patient is now off isolation.  NUTRITION - FOCUSED PHYSICAL EXAM:    Most Recent Value  Orbital Region No depletion  Upper Arm Region No depletion  Thoracic and Lumbar Region No depletion  Buccal Region Unable to assess  Temple Region No  depletion  Clavicle Bone Region No depletion  Clavicle and Acromion Bone Region No depletion  Scapular Bone Region Unable to assess  Dorsal Hand No depletion  Patellar Region No depletion  Anterior Thigh Region No depletion  Posterior Calf Region No depletion  Edema (RD Assessment) Mild  Hair Reviewed  Eyes Unable to assess  Mouth Unable to assess  Skin Reviewed  Nails Reviewed     Diet Order:   Diet Order            Diet NPO time specified  Diet effective now                EDUCATION NEEDS:   No education needs have been identified at this time  Skin:  Skin Assessment: Skin Integrity Issues: Skin Integrity Issues:: DTI, Other (Comment) DTI: buttocks Other: blister to buttocks  Last BM:  09/02/2020 per chart  Height:   Ht Readings from Last 1 Encounters:  08/25/20 5' 9.13" (1.756 m)   Weight:   Wt Readings from Last 1 Encounters:  09/02/20 123 kg   Ideal Body Weight:  65.9 kg  BMI:  Body mass index is 39.89 kg/m.  Estimated Nutritional Needs:   Kcal:  2600-2800  Protein:  145-155 grams  Fluid:  >/= 2 L/day  Jacklynn Barnacle, MS, RD, LDN Pager number available on Amion

## 2020-09-02 NOTE — Progress Notes (Signed)
Cuff leak assessed per new daily order. Cuff deflated, no audible leak was noted and exhaled tidal volumes remained 400-515 ml. Cuff re-inflated to 30 cm h20.

## 2020-09-03 ENCOUNTER — Inpatient Hospital Stay: Payer: Medicaid Other

## 2020-09-03 DIAGNOSIS — U071 COVID-19: Secondary | ICD-10-CM | POA: Diagnosis not present

## 2020-09-03 DIAGNOSIS — J9601 Acute respiratory failure with hypoxia: Secondary | ICD-10-CM | POA: Diagnosis not present

## 2020-09-03 DIAGNOSIS — J9600 Acute respiratory failure, unspecified whether with hypoxia or hypercapnia: Secondary | ICD-10-CM | POA: Diagnosis present

## 2020-09-03 LAB — BASIC METABOLIC PANEL
Anion gap: 12 (ref 5–15)
BUN: 23 mg/dL — ABNORMAL HIGH (ref 6–20)
CO2: 32 mmol/L (ref 22–32)
Calcium: 9.6 mg/dL (ref 8.9–10.3)
Chloride: 97 mmol/L — ABNORMAL LOW (ref 98–111)
Creatinine, Ser: 0.91 mg/dL (ref 0.44–1.00)
GFR calc non Af Amer: >60 mL/min (ref >60)
Glucose, Bld: 143 mg/dL — ABNORMAL HIGH (ref 70–99)
Potassium: 3.9 mmol/L (ref 3.5–5.1)
Sodium: 141 mmol/L (ref 135–145)

## 2020-09-03 LAB — GLUCOSE, CAPILLARY
Glucose-Capillary: 134 mg/dL — ABNORMAL HIGH (ref 70–99)
Glucose-Capillary: 136 mg/dL — ABNORMAL HIGH (ref 70–99)
Glucose-Capillary: 138 mg/dL — ABNORMAL HIGH (ref 70–99)
Glucose-Capillary: 116 mg/dL — ABNORMAL HIGH (ref 70–99)
Glucose-Capillary: 137 mg/dL — ABNORMAL HIGH (ref 70–99)

## 2020-09-03 LAB — CBC WITH DIFFERENTIAL/PLATELET
Abs Immature Granulocytes: 0.09 10*3/uL — ABNORMAL HIGH (ref 0.00–0.07)
Basophils Absolute: 0 10*3/uL (ref 0.0–0.1)
Basophils Relative: 0 %
Eosinophils Absolute: 0.2 10*3/uL (ref 0.0–0.5)
Eosinophils Relative: 2 %
HCT: 32.4 % — ABNORMAL LOW (ref 36.0–46.0)
Hemoglobin: 9.9 g/dL — ABNORMAL LOW (ref 12.0–15.0)
Immature Granulocytes: 1 %
Lymphocytes Relative: 11 %
Lymphs Abs: 1.1 10*3/uL (ref 0.7–4.0)
MCH: 24.2 pg — ABNORMAL LOW (ref 26.0–34.0)
MCHC: 30.6 g/dL (ref 30.0–36.0)
MCV: 79.2 fL — ABNORMAL LOW (ref 80.0–100.0)
Monocytes Absolute: 0.7 10*3/uL (ref 0.1–1.0)
Monocytes Relative: 7 %
Neutro Abs: 8 10*3/uL — ABNORMAL HIGH (ref 1.7–7.7)
Neutrophils Relative %: 79 %
Platelets: 322 10*3/uL (ref 150–400)
RBC: 4.09 MIL/uL (ref 3.87–5.11)
RDW: 25.9 % — ABNORMAL HIGH (ref 11.5–15.5)
Smear Review: NORMAL
WBC: 10 10*3/uL (ref 4.0–10.5)
nRBC: 0 % (ref 0.0–0.2)

## 2020-09-03 LAB — PHOSPHORUS: Phosphorus: 5.4 mg/dL — ABNORMAL HIGH (ref 2.5–4.6)

## 2020-09-03 LAB — MAGNESIUM: Magnesium: 2.4 mg/dL (ref 1.7–2.4)

## 2020-09-03 NOTE — Progress Notes (Signed)
NAME:  Kathy Vega, MRN:  [id number], DOB:  [birth year], LOS: 21 ADMISSION DATE:  08/13/2020, CONSULTATION DATE:  08/13/2020  Brief History   44 yo unvaccinated female admitted with COVID-19 pneumonia & ARDS requiring intubation and mechanical ventilation.  Past Medical History  Thyroid disease Anemia  Significant Hospital Events   09/16: Pt admitted to ICU requiring Heated HFNC and NRB, however remained in the ER pending ICU bed availability  09/16: CT Head revealed no acute intracranial abnormality 09/17: Pt with worsening hypoxia requiring mechanical intubation 09/18: [facility] refused to accept pt for ECMO 09/21: Attempted to transfer pt to [facility] for possible assessment for ECMO, however both hospitals DO NOT have bed availability and are at capacity and pt may or may NOT be an ECMO candidate at this time  09/23: Pt unproned  09/24:  Resumed proning protocol 09/25: Resumed proning protocol  09/26: Pt unproned 9/27-  No onvernight events, patient remains in ARDS on elevated settings. 08/25/20- patient is on maximal settings with sedation. She is on day 11 of her illness with little improvement.  Ive attempted to reach husband via cell no answer, ive successfully found her daughter and spoke with her regarding care plan and she is agreeable but requested for me to speak with [name] and has provided me with secondary phone number [phone] but again only voice mail was reachable.  Have left voicemail and will discuss trache with family when they are available.  08/26/20- patient without overnight events, ive called POA husband again today but was unable to reach him and left voice mail.  08/27/20- patient critically ill with overall poor prognosis, she had desaturation event today and was difficult to improve requiring multiple changes on ventilator by RT.  08/28/20- Patient is weaned to 60% FiO2 on MV.  Have called Garlan Fillers today and updated him. We  discussed trache and he is agreeable.  08/29/20- Spoke with husband today, answered questions ,updated on care plan,  he is ready to speak with ENT regarding trache palcement 08/30/20- patient had SBT today she was awake and followed verbal communication but after 2 hours had severly labored breathing and thick high volume secretions.  I have met with husband Garlan Fillers and he was able to see patient at bedside we discussed trache. I have updated Dr Tami Ribas regarding trache plan.  08/31/20 - no airleak will need trach, discussed with Dr.Vaught. 09/01/20-Still no air leak. Patient has trach scheduled for Friday of this week and thus no need for steroids at this time 09/02/20- no air leak, continuing plan of care, no other concerns Consults:  Surgery  Procedures:  ETT 9/17 >> PICC 9/17 >>  Significant Diagnostic Tests:    Micro Data:  MRSA PCR 9/16 >> negative BC 9/16 >> negative  Antimicrobials:  Metronidazole 9/24 >> 9/29  Interim history/subjective:  Patient alert/drowsy responsive to commands, denies pain.  Overnight- no care concerns per RN staff Labs/Imaging personally reviewed  BUN/ Cr.: 23/ 0.91 Hgb: 9.9  CXR 10/7: Stable bilateral infiltrates Objective   Blood pressure 97/64, pulse 80, temperature (!) 100.7 F (38.2 C), temperature source Oral, resp. rate 16, height 5' 9.13" (1.756 m), weight 122.2 kg, SpO2 97 %.    Vent Mode: SIMV/PC/PS FiO2 (%):  [45 %] 45 % Set Rate:  [12 bmp] 12 bmp Vt Set:  [420 mL] 420 mL PEEP:  [5 cmH20] 5 cmH20 Pressure Support:  [10 cmH20] 10 cmH20   Intake/Output Summary (Last  24 hours) at 09/03/2020 1058 Last data filed at 09/03/2020 0700 Gross per 24 hour  Intake 2486.86 ml  Output 4375 ml  Net -1888.14 ml   Filed Weights   09/01/20 0500 09/02/20 0419 09/03/20 0418  Weight: 123.1 kg 123 kg 122.2 kg    Examination: General: Adult female, lying in bed intubated & sedated on mechanical ventilation HEENT: MM pink/moist,  anicteric Neuro: Alert/drowsy, follows commands, MAE- generalized weakness, PERRL +3 CV: s1s2, RRR, NSR on monitor, no m/r/g, pulses +2 R/P Pulm: regular, non labored on SIMV at 45% - very minimal cuff leak, breath sounds coarse throughout GI: soft, rounded, bs x 4 Skin: known blisters on buttocks, no rashes/ lesions noted Extremities: warm/dry, +1 generalized edema  Resolved Hospital Problem list   - Off of airborne/ contact precautions - Elevated Liver Enzymes  Assessment & Plan:   Acute hypoxic respiratory failure secondary to COVID-19 pneumonia and ARDS Morbid Obesity, possible OSA  S/p course of remdesevir, azithromycin & ceftriaxone. Off airborne precautions.  - Ventilator settings established: SIMV 7 mL/kg - VERY minimal cuff leak assessed - plan for Trach 10/8 - RASS goal 0, -1 - continue precedex, wean as tolerated - continue vitamin C & zinc  Troponin elevation due to demand ischemia Hypertrophic cardiomyopathy Echo: LVEF 45-50% with severe concentric LVH, diastolic dysfunction  - no pressor requirement - continuous cardiac monitoring - continue Lasix BD as tolerated  Mild acute renal failure -resolved Hypokalemia -resolved Hypernatremia -resolved  - daily BMET, replace electrolytes PRN - strict I/O - avoid nephrotoxic agents, ensure adequate renal perfusion  Anemia without obvious acute blood loss Last transfusion 9/23  - daily CBC - monitor for s/s of bleeding - transfuse for Hgb < 7  Hyperglycemia   - Q 4 CBG, SSI    Best practice:   Diet: NPO with TF Pain/Anxiety/Delirium protocol (if indicated): Precedex & Fentanyl VAP protocol (if indicated): established DVT prophylaxis: Heparin SQ & SCDs GI prophylaxis: Pepcid Glucose control: Q 4 with SSI, target range 140-180 Mobility: Bedrest Code Status: FULL Family Communication: Updated husband bedside 10/6, all questions answered Disposition: ICU  Labs   CBC: Recent Labs  Lab  08/30/20 0429 08/31/20 0455 09/01/20 0448 09/02/20 0528 09/03/20 0426  WBC 7.6 10.5 10.2 9.9 10.0  NEUTROABS 5.9 9.1* 8.7* 8.0* 8.0*  HGB 9.5* 9.7* 9.7* 9.7* 9.9*  HCT 29.9* 30.6* 31.8* 30.8* 32.4*  MCV 77.1* 75.9* 77.6* 77.0* 79.2*  PLT 311 338 358 329 943    Basic Metabolic Panel: Recent Labs  Lab 08/30/20 0429 08/31/20 0455 09/01/20 0448 09/02/20 0528 09/03/20 0426  NA  --  137 136 138 141  K  --  4.5 4.3 4.1 3.9  CL  --  93* 91* 94* 97*  CO2  --  34* 33* 33* 32  GLUCOSE  --  159* 131* 156* 143*  BUN  --  18 19 21* 23*  CREATININE  --  0.81 0.84 0.82 0.91  CALCIUM  --  9.2 9.5 9.5 9.6  MG 2.1 2.2 2.2 2.3 2.4  PHOS 4.1 4.0 4.4 3.9 5.4*   GFR: Estimated Creatinine Clearance: 110.6 mL/min (by C-G formula based on SCr of 0.91 mg/dL). Recent Labs  Lab 08/31/20 0455 09/01/20 0448 09/02/20 0528 09/03/20 0426  WBC 10.5 10.2 9.9 10.0    Liver Function Tests: No results for input(s): AST, ALT, ALKPHOS, BILITOT, PROT, ALBUMIN in the last 168 hours. No results for input(s): LIPASE, AMYLASE in the last 168 hours. No results for  input(s): AMMONIA in the last 168 hours.  ABG    Component Value Date/Time   PHART 7.47 (H) 08/31/2020 0800   PCO2ART 53 (H) 08/31/2020 0800   PO2ART 61 (L) 08/31/2020 0800   HCO3 38.6 (H) 08/31/2020 0800   O2SAT 92.5 08/31/2020 0800     Coagulation Profile: No results for input(s): INR, PROTIME in the last 168 hours.  Cardiac Enzymes: No results for input(s): CKTOTAL, CKMB, CKMBINDEX, TROPONINI in the last 168 hours.  HbA1C: No results found for: HGBA1C  CBG: Recent Labs  Lab 09/02/20 1611 09/02/20 2021 09/02/20 2332 09/03/20 0338 09/03/20 0858  GLUCAP 151* 137* 135* 134* 136*     Critical care time: 35 minutes     Domingo Pulse [place], AGACNP-BC [facility]    Please see Amion for pager details.

## 2020-09-03 NOTE — Progress Notes (Signed)
09/03/2020 10:28 AM  Kathy Vega [id number]  Preop for tracheostomy tomorrow    Temp:  [99.1 F (37.3 C)-100.7 F (38.2 C)] 100.7 F (38.2 C) (10/07 0327) Pulse Rate:  [73-110] 80 (10/07 0700) Resp:  [0-25] 16 (10/07 0700) BP: (97-135)/(64-86) 97/64 (10/07 0700) SpO2:  [91 %-99 %] 97 % (10/07 0827) FiO2 (%):  [45 %] 45 % (10/07 0827) Weight:  [122.2 kg] 122.2 kg (10/07 0418),     Intake/Output Summary (Last 24 hours) at 09/03/2020 1028 Last data filed at 09/03/2020 0700 Gross per 24 hour  Intake 2486.86 ml  Output 4375 ml  Net -1888.14 ml    Results for orders placed or performed during the hospital encounter of 08/13/20 (from the past 24 hour(s))  Glucose, capillary     Status: Abnormal   Collection Time: 09/02/20 11:42 AM  Result Value Ref Range   Glucose-Capillary 141 (H) 70 - 99 mg/dL   Comment 1 Notify RN   Glucose, capillary     Status: Abnormal   Collection Time: 09/02/20  4:11 PM  Result Value Ref Range   Glucose-Capillary 151 (H) 70 - 99 mg/dL   Comment 1 Notify RN   Glucose, capillary     Status: Abnormal   Collection Time: 09/02/20  8:21 PM  Result Value Ref Range   Glucose-Capillary 137 (H) 70 - 99 mg/dL  Glucose, capillary     Status: Abnormal   Collection Time: 09/02/20 11:32 PM  Result Value Ref Range   Glucose-Capillary 135 (H) 70 - 99 mg/dL  Glucose, capillary     Status: Abnormal   Collection Time: 09/03/20  3:38 AM  Result Value Ref Range   Glucose-Capillary 134 (H) 70 - 99 mg/dL  Magnesium     Status: None   Collection Time: 09/03/20  4:26 AM  Result Value Ref Range   Magnesium 2.4 1.7 - 2.4 mg/dL  CBC with Differential/Platelet     Status: Abnormal   Collection Time: 09/03/20  4:26 AM  Result Value Ref Range   WBC 10.0 4.0 - 10.5 K/uL   RBC 4.09 3.87 - 5.11 MIL/uL   Hemoglobin 9.9 (L) 12.0 - 15.0 g/dL   HCT 32.4 (L) 36 - 46 %   MCV 79.2 (L) 80.0 - 100.0 fL   MCH 24.2 (L) 26.0 - 34.0 pg   MCHC 30.6 30.0 - 36.0 g/dL   RDW 25.9 (H) 11.5  - 15.5 %   Platelets 322 150 - 400 K/uL   nRBC 0.0 0.0 - 0.2 %   Neutrophils Relative % 79 %   Neutro Abs 8.0 (H) 1.7 - 7.7 K/uL   Lymphocytes Relative 11 %   Lymphs Abs 1.1 0.7 - 4.0 K/uL   Monocytes Relative 7 %   Monocytes Absolute 0.7 0.1 - 1.0 K/uL   Eosinophils Relative 2 %   Eosinophils Absolute 0.2 0 - 0 K/uL   Basophils Relative 0 %   Basophils Absolute 0.0 0 - 0 K/uL   WBC Morphology MORPHOLOGY UNREMARKABLE    Smear Review Normal platelet morphology    Immature Granulocytes 1 %   Abs Immature Granulocytes 0.09 (H) 0.00 - 0.07 K/uL   Polychromasia PRESENT    Spherocytes PRESENT   Phosphorus     Status: Abnormal   Collection Time: 09/03/20  4:26 AM  Result Value Ref Range   Phosphorus 5.4 (H) 2.5 - 4.6 mg/dL  Basic metabolic panel     Status: Abnormal   Collection Time: 09/03/20  4:26 AM  Result Value Ref Range   Sodium 141 135 - 145 mmol/L   Potassium 3.9 3.5 - 5.1 mmol/L   Chloride 97 (L) 98 - 111 mmol/L   CO2 32 22 - 32 mmol/L   Glucose, Bld 143 (H) 70 - 99 mg/dL   BUN 23 (H) 6 - 20 mg/dL   Creatinine, Ser 0.91 0.44 - 1.00 mg/dL   Calcium 9.6 8.9 - 10.3 mg/dL   GFR calc non Af Amer >60 >60 mL/min   Anion gap 12 5 - 15  Glucose, capillary     Status: Abnormal   Collection Time: 09/03/20  8:58 AM  Result Value Ref Range   Glucose-Capillary 136 (H) 70 - 99 mg/dL    SUBJECTIVE:  Failure to extubate  OBJECTIVE:  No changes  IMPRESSION:  Failure to extubate  PLAN:  Scheduled for trache tomorrow at noon.  Spoke with Mr. Kathy Vega via phone, discussed risks and benefits including bleeding, infection and death.  He wishes to proceed.    Roena Malady 09/03/2020, 10:28 AM

## 2020-09-03 NOTE — Progress Notes (Signed)
Assisted tele visit to patient with family member.  Mervyn Pflaum D Ashonte Angelucci, RN   

## 2020-09-04 ENCOUNTER — Inpatient Hospital Stay: Payer: Medicaid Other | Admitting: Anesthesiology

## 2020-09-04 ENCOUNTER — Encounter: Payer: Self-pay | Admitting: Internal Medicine

## 2020-09-04 ENCOUNTER — Encounter
Admission: EM | Disposition: A | Discharge status: Home Health | Payer: Self-pay | Source: Home / Self Care | Attending: Internal Medicine

## 2020-09-04 DIAGNOSIS — U071 COVID-19: Secondary | ICD-10-CM | POA: Diagnosis not present

## 2020-09-04 DIAGNOSIS — J9601 Acute respiratory failure with hypoxia: Secondary | ICD-10-CM | POA: Diagnosis not present

## 2020-09-04 DIAGNOSIS — G9340 Encephalopathy, unspecified: Secondary | ICD-10-CM | POA: Diagnosis not present

## 2020-09-04 HISTORY — PX: TRACHEOSTOMY TUBE PLACEMENT: SHX814

## 2020-09-04 LAB — CBC WITH DIFFERENTIAL/PLATELET
Abs Immature Granulocytes: 0.06 10*3/uL (ref 0.00–0.07)
Basophils Absolute: 0 10*3/uL (ref 0.0–0.1)
Basophils Relative: 0 %
Eosinophils Absolute: 0.2 10*3/uL (ref 0.0–0.5)
Eosinophils Relative: 3 %
HCT: 33.2 % — ABNORMAL LOW (ref 36.0–46.0)
Hemoglobin: 10.1 g/dL — ABNORMAL LOW (ref 12.0–15.0)
Immature Granulocytes: 1 %
Lymphocytes Relative: 11 %
Lymphs Abs: 1 10*3/uL (ref 0.7–4.0)
MCH: 24.3 pg — ABNORMAL LOW (ref 26.0–34.0)
MCHC: 30.4 g/dL (ref 30.0–36.0)
MCV: 80 fL (ref 80.0–100.0)
Monocytes Absolute: 0.7 10*3/uL (ref 0.1–1.0)
Monocytes Relative: 8 %
Neutro Abs: 6.7 10*3/uL (ref 1.7–7.7)
Neutrophils Relative %: 77 %
Platelets: 288 10*3/uL (ref 150–400)
RBC: 4.15 MIL/uL (ref 3.87–5.11)
RDW: 26.2 % — ABNORMAL HIGH (ref 11.5–15.5)
Smear Review: NORMAL
WBC: 8.7 10*3/uL (ref 4.0–10.5)
nRBC: 0 % (ref 0.0–0.2)

## 2020-09-04 LAB — BASIC METABOLIC PANEL
Anion gap: 11 (ref 5–15)
BUN: 24 mg/dL — ABNORMAL HIGH (ref 6–20)
CO2: 30 mmol/L (ref 22–32)
Calcium: 9 mg/dL (ref 8.9–10.3)
Chloride: 100 mmol/L (ref 98–111)
Creatinine, Ser: 0.9 mg/dL (ref 0.44–1.00)
GFR calc non Af Amer: >60 mL/min (ref >60)
Glucose, Bld: 127 mg/dL — ABNORMAL HIGH (ref 70–99)
Potassium: 4 mmol/L (ref 3.5–5.1)
Sodium: 141 mmol/L (ref 135–145)

## 2020-09-04 LAB — GLUCOSE, CAPILLARY
Glucose-Capillary: 100 mg/dL — ABNORMAL HIGH (ref 70–99)
Glucose-Capillary: 106 mg/dL — ABNORMAL HIGH (ref 70–99)
Glucose-Capillary: 110 mg/dL — ABNORMAL HIGH (ref 70–99)
Glucose-Capillary: 111 mg/dL — ABNORMAL HIGH (ref 70–99)
Glucose-Capillary: 116 mg/dL — ABNORMAL HIGH (ref 70–99)
Glucose-Capillary: 128 mg/dL — ABNORMAL HIGH (ref 70–99)
Glucose-Capillary: 128 mg/dL — ABNORMAL HIGH (ref 70–99)
Glucose-Capillary: 138 mg/dL — ABNORMAL HIGH (ref 70–99)

## 2020-09-04 LAB — MAGNESIUM: Magnesium: 2.2 mg/dL (ref 1.7–2.4)

## 2020-09-04 LAB — PHOSPHORUS: Phosphorus: 5.2 mg/dL — ABNORMAL HIGH (ref 2.5–4.6)

## 2020-09-04 SURGERY — CREATION, TRACHEOSTOMY
Anesthesia: General

## 2020-09-04 MED ORDER — ROCURONIUM BROMIDE 100 MG/10ML IV SOLN
INTRAVENOUS | Status: DC | PRN
  Administered 2020-09-04: 20 mg via INTRAVENOUS

## 2020-09-04 MED ORDER — PROPOFOL 10 MG/ML IV BOLUS
INTRAVENOUS | Status: AC
  Filled 2020-09-04: qty 20

## 2020-09-04 MED ORDER — PROPOFOL 10 MG/ML IV BOLUS
INTRAVENOUS | Status: DC | PRN
  Administered 2020-09-04: 50 mg via INTRAVENOUS

## 2020-09-04 MED ORDER — PHENYLEPHRINE HCL (PRESSORS) 10 MG/ML IV SOLN
INTRAVENOUS | Status: DC | PRN
  Administered 2020-09-04 (×4): 200 ug via INTRAVENOUS

## 2020-09-04 MED ORDER — LIDOCAINE-EPINEPHRINE 1 %-1:100000 IJ SOLN
INTRAMUSCULAR | Status: DC | PRN
  Administered 2020-09-04: 5 mL

## 2020-09-04 MED ORDER — FENTANYL CITRATE (PF) 100 MCG/2ML IJ SOLN
INTRAMUSCULAR | Status: DC | PRN
  Administered 2020-09-04: 100 ug via INTRAVENOUS

## 2020-09-04 MED ORDER — FENTANYL CITRATE (PF) 100 MCG/2ML IJ SOLN
INTRAMUSCULAR | Status: AC
  Filled 2020-09-04: qty 2

## 2020-09-04 MED ORDER — SUGAMMADEX SODIUM 200 MG/2ML IV SOLN
INTRAVENOUS | Status: DC | PRN
  Administered 2020-09-04: 200 mg via INTRAVENOUS

## 2020-09-04 MED ORDER — CHLORHEXIDINE GLUCONATE CLOTH 2 % EX PADS
6.0000 | MEDICATED_PAD | Freq: Every day | CUTANEOUS | Status: DC
  Administered 2020-09-04 – 2020-09-13 (×9): 6 via TOPICAL

## 2020-09-04 MED ORDER — MIDAZOLAM HCL 2 MG/2ML IJ SOLN
INTRAMUSCULAR | Status: AC
  Filled 2020-09-04: qty 2

## 2020-09-04 MED ORDER — MORPHINE SULFATE (PF) 2 MG/ML IV SOLN
2.0000 mg | INTRAVENOUS | Status: DC | PRN
  Administered 2020-09-04 – 2020-09-06 (×5): 2 mg via INTRAVENOUS
  Filled 2020-09-04 (×5): qty 1

## 2020-09-04 MED ORDER — LACTATED RINGERS IV SOLN: INTRAVENOUS | Status: DC | PRN

## 2020-09-04 SURGICAL SUPPLY — 35 items
BLADE SURG 15 STRL LF DISP TIS (BLADE) ×1 IMPLANT
BLADE SURG 15 STRL SS (BLADE) ×3
BLADE SURG SZ11 CARB STEEL (BLADE) ×3 IMPLANT
CANISTER SUCT 1200ML W/VALVE (MISCELLANEOUS) ×3 IMPLANT
COVER WAND RF STERILE (DRAPES) ×3 IMPLANT
ELECT CAUTERY BLADE TIP 2.5 (TIP) ×3
ELECT REM PT RETURN 9FT ADLT (ELECTROSURGICAL) ×3
ELECTRODE CAUTERY BLDE TIP 2.5 (TIP) ×1 IMPLANT
ELECTRODE REM PT RTRN 9FT ADLT (ELECTROSURGICAL) ×1 IMPLANT
GAUZE PACKING IODOFORM 1/2 (PACKING) IMPLANT
GLOVE BIO SURGEON STRL SZ7.5 (GLOVE) ×3 IMPLANT
GOWN STRL REUS W/ TWL LRG LVL3 (GOWN DISPOSABLE) ×3 IMPLANT
GOWN STRL REUS W/TWL LRG LVL3 (GOWN DISPOSABLE) ×9
HLDR TRACH TUBE NECKBAND 18 (MISCELLANEOUS) ×1 IMPLANT
HOLDER TRACH TUBE NECKBAND 18 (MISCELLANEOUS) ×2
LABEL OR SOLS (LABEL) ×3 IMPLANT
NS IRRIG 500ML POUR BTL (IV SOLUTION) ×3 IMPLANT
PACK HEAD/NECK (MISCELLANEOUS) ×3 IMPLANT
SHEARS HARMONIC 9CM CVD (BLADE) ×3 IMPLANT
SPONGE DRAIN TRACH 4X4 STRL 2S (GAUZE/BANDAGES/DRESSINGS) ×3 IMPLANT
SPONGE KITTNER 5P (MISCELLANEOUS) ×3 IMPLANT
SPONGE VERSALON 4X4 4PLY (MISCELLANEOUS) IMPLANT
SUCTION FRAZIER HANDLE 10FR (MISCELLANEOUS) ×2
SUCTION TUBE FRAZIER 10FR DISP (MISCELLANEOUS) ×1 IMPLANT
SUT ETHILON 2 0 FS 18 (SUTURE) ×3 IMPLANT
SUT SILK 2 0 (SUTURE)
SUT SILK 2 0 SH (SUTURE) ×3 IMPLANT
SUT SILK 2-0 18XBRD TIE 12 (SUTURE) IMPLANT
SUT VIC AB 3-0 PS2 18 (SUTURE) ×3 IMPLANT
SUT VIC AB 4-0 RB1 18 (SUTURE) ×3 IMPLANT
SYR 3ML LL SCALE MARK (SYRINGE) ×3 IMPLANT
TUBE TRACH 6 EXL DIST CUF (TUBING) IMPLANT
TUBE TRACH 8.0 EXL DIST CUF (TUBING) ×3 IMPLANT
TUBE TRACH SHILEY  6 DIST  CUF (TUBING) IMPLANT
TUBE TRACH SHILEY 8 DIST CUF (TUBING) IMPLANT

## 2020-09-04 NOTE — Anesthesia Preprocedure Evaluation (Addendum)
Anesthesia Evaluation  Patient identified by MRN, date of birth, ID band Patient awake    Reviewed: Allergy & Precautions, H&P , NPO status , Patient's Chart, lab work & pertinent test results  History of Anesthesia Complications Negative for: history of anesthetic complications  Airway Mallampati: Intubated  TM Distance: >3 FB Neck ROM: full    Dental  (+) Chipped   Pulmonary pneumonia, unresolved,  ARDS 2/2 COVID, unvaccinated   Pulmonary exam normal        Cardiovascular (-) angina(-) Past MI and (-) DOE Normal cardiovascular exam  TTE 07/2020 1. Left ventricular ejection fraction, by estimation, is 45 to 50%. The  left ventricle has mildly decreased function. The left ventricle  demonstrates global hypokinesis. There is severe concentric left  ventricular hypertrophy. Left ventricular diastolic  parameters are consistent with Grade I diastolic dysfunction (impaired  relaxation).  2. Right ventricular systolic function is normal. The right ventricular  size is normal.  3. Left atrial size was moderately dilated.  4. Right atrial size was mild to moderately dilated.  5. A small pericardial effusion is present. The pericardial effusion is  circumferential.  6. The mitral valve is normal in structure. Trivial mitral valve  regurgitation. No evidence of mitral stenosis.  7. The aortic valve is normal in structure. Aortic valve regurgitation is  not visualized. Mild aortic valve sclerosis is present, with no evidence  of aortic valve stenosis.  8. The inferior vena cava is normal in size with greater than 50%  respiratory variability, suggesting right atrial pressure of 3 mmHg.    Neuro/Psych negative neurological ROS  negative psych ROS   GI/Hepatic negative GI ROS, Neg liver ROS, neg GERD  ,  Endo/Other  negative endocrine ROS  Renal/GU Renal disease     Musculoskeletal   Abdominal   Peds   Hematology negative hematology ROS (+)   Anesthesia Other Findings COVID 19  Past Medical History: No date: Anemia No date: Thyroid disease  Past Surgical History: No date: CHOLECYSTECTOMY No date: LEG SURGERY  BMI    Body Mass Index: 39.63 kg/m      Reproductive/Obstetrics negative OB ROS                            Anesthesia Physical Anesthesia Plan  ASA: III  Anesthesia Plan: General ETT   Post-op Pain Management:    Induction: Intravenous  PONV Risk Score and Plan: Ondansetron, Dexamethasone, Midazolam and Treatment may vary due to age or medical condition  Airway Management Planned: Oral ETT and Tracheostomy  Additional Equipment:   Intra-op Plan:   Post-operative Plan: Post-operative intubation/ventilation  Informed Consent: I have reviewed the patients History and Physical, chart, labs and discussed the procedure including the risks, benefits and alternatives for the proposed anesthesia with the patient or authorized representative who has indicated his/her understanding and acceptance.     Dental Advisory Given  Plan Discussed with: Anesthesiologist, CRNA and Surgeon  Anesthesia Plan Comments: (History and phone consent from the patients husband [name] at [phone]   He was consented for risks of anesthesia including but not limited to:  - adverse reactions to medications - damage to eyes, teeth, lips or other oral mucosa - nerve damage due to positioning  - sore throat or hoarseness - Damage to heart, brain, nerves, lungs, other parts of body or loss of life  He voiced understanding.)        Anesthesia Quick Evaluation

## 2020-09-04 NOTE — Op Note (Signed)
09/04/2020 12:48 PM  Kathy Vega [id number]  Pre-Op Dx: Prolonged intubation Post-Op Dx:  Same  Proc:  Tracheostomy  Surg:  Roena Malady   Asst: Bennett  Anes:  GOT  EBL: Less than 20 cc  Comp: None  Findings: Significant tracheal swelling  Procedure:  The patient was brought from the intensive care unit to the operating room and transferred to an operating table.  Anesthesia was administered per indwelling orotracheal tube.   Neck extension was achieved as possible anda shoulder rolke was placed.  The lower neck was palpated with the findings as described above.  1% Xylocaine with 1:100,000 epinephrine, 6 cc's, was infiltrated into the surgical field for intraoperative hemostasis.  Several minutes were allowed for this to take effect. The patient was prepped in a sterile fashion with a surgical prep from the chin down to the upper chest.  Sterile draping was accomplished in the standard fashion.  A 4 cm horizontal incision was made sharply a finger's breadth above the sternal notch, and extended through skin and subcutaneous fat.  Using cautery, the superficial layer of the deep cervical fascia was lysed.  Additional dissection revealed the strap muscles.  The midline raphe was divided in two layers and the muscles retracted laterally.  The pretracheal plane was visualized.  This was entered bluntly.  The thyroid isthmus was isolated and divided with the Harmonic scalpel.  The thyroid gland was retracted to either side.  The anterior face of the trachea was cleared.  In the third interspace, a transverse incision was made between cartilage rings into the tracheal lumen.  A 6 mm wide inferiorly based flap was generated and secured to the lower wound with a 4-0 Vicryl suture.   A previously tested  #8 XLT distal Shiley cuffed tracheostomy tube was brought into the field.  With the endotracheal tube under direct visualization through the tracheostomy, it was gently backed up.  The  tracheostomy tube was inserted into the tracheal lumen.  Hemostasis was observed. The cuff was inflated and observed to be intact and containing pressure. The inner cannula was placed and ventilation assumed per tracheostomy tube.  Good chest wall motion was observed, and CO2 was documented per anesthesia.  The trach tube was secured in the standard fashion with trach ties. A 2-0 silk suture was used to secure the trach tube to the skin on both sides.  Hemostasis was observed again.  When satisfactory ventilation was assured, the orotracheal tube was removed.  At this point the procedure was completed.  The patient was returned to anesthesia, awakened as possible, and transferred back to the intensive care unit in stable condition.  Comment: 44 y.o. female with prolonged ventilation was the indication for today's procedure.  Anticipate a routine postoperative recovery including standard tracheal hygiene.  The sutures should be removed in 5 days.  When the patient no longer requires ventilator or pressure support, the cuff should be deflated.  Changing to an uncuffed tube and downsizing will be according to the clinical condition of the patient.   Roena Malady  12:48 PM 09/04/2020

## 2020-09-04 NOTE — Transfer of Care (Signed)
Immediate Anesthesia Transfer of Care Note  Patient: Kathy Vega  Procedure(s) Performed: TRACHEOSTOMY (N/A )  Patient Location: ICU  Anesthesia Type:General  Level of Consciousness: unresponsive  Airway & Oxygen Therapy: Patient Spontanous Breathing  Post-op Assessment: Report given to RN and Post -op Vital signs reviewed and stable  Post vital signs: Reviewed and stable  Last Vitals:  Vitals Value Taken Time  BP    Temp    Pulse    Resp    SpO2      Last Pain:  Vitals:   09/04/20 0838  TempSrc: Axillary  PainSc:       Patients Stated Pain Goal: 4 (44/51/46 0479)  Complications: No complications documented.

## 2020-09-04 NOTE — Progress Notes (Signed)
Patient resting comfortably. Precedex turned down to 0.7, BP 82/61

## 2020-09-04 NOTE — Progress Notes (Signed)
Daughter updated, states that she will visit tomorrow.

## 2020-09-04 NOTE — Anesthesia Postprocedure Evaluation (Signed)
Anesthesia Post Note  Patient: Kathy Vega  Procedure(s) Performed: TRACHEOSTOMY (N/A )  Patient location during evaluation: ICU Anesthesia Type: General Level of consciousness: sedated Pain management: pain level controlled Vital Signs Assessment: post-procedure vital signs reviewed and stable Respiratory status: spontaneous breathing, nonlabored ventilation, respiratory function stable and patient on ventilator - see flowsheet for VS (via trach) Cardiovascular status: blood pressure returned to baseline and stable Postop Assessment: no apparent nausea or vomiting Anesthetic complications: no   No complications documented.   Last Vitals:  Vitals:   09/04/20 1100 09/04/20 1215  BP: 105/60 137/67  Pulse: 83 87  Resp: (!) 23 17  Temp:  37.2 C  SpO2: 95% 97%    Last Pain:  Vitals:   09/04/20 0838  TempSrc: Axillary  PainSc:                  Arita Miss

## 2020-09-04 NOTE — Progress Notes (Signed)
To the OR via bed with CRNA bagging her with 100% oxygen.

## 2020-09-04 NOTE — Progress Notes (Signed)
CRITICAL CARE NOTE 44 yo unvaccinated female admitted with COVID-19 pneumonia & ARDS requiring intubation and mechanical ventilation.  Past Medical History  Thyroid disease Anemia  Significant Hospital Events   09/16: Pt admitted to ICU requiring Heated HFNC and NRB, however remained in the ER pending ICU bed availability  09/16: CT Head revealed no acute intracranial abnormality 09/17: Pt with worsening hypoxia requiring mechanical intubation 09/18: [facility] refused to accept pt for ECMO 09/21: Attempted to transfer pt to [facility] and [place] for possible assessment for ECMO, however both hospitals DO NOT have bed availability and are at capacity and pt may or may NOT be an ECMO candidate at this time  09/23: Pt unproned  09/24: Resumed proning protocol 09/25: Resumed proning protocol  09/26: Pt unproned 9/27-No onvernight events, patient remains in ARDS on elevated settings. 08/25/20- patient is on maximal settings with sedation. She is on day 11 of her illness with little improvement. Ive attempted to reach husband via cell no answer, ive successfully found her daughter and spoke with her regarding care plan and she is agreeable but requested for me to speak with [name] and has provided me with secondary phone number [phone] but again only voice mail was reachable. Have left voicemail and will discuss trache with family when they are available.  08/26/20-patient without overnight events, ive called POA husband again today but was unable to reach him and left voice mail.  08/27/20-patient critically ill with overall poor prognosis, she had desaturation event today and was difficult to improve requiring multiple changes on ventilator by RT.  08/28/20- Patient is weaned to 60% FiO2 on MV. Have called Garlan Fillers today and updated him. We discussed trache and he is agreeable.  08/29/20- Spoke with husband today, answered questions ,updated on care plan, he is  ready to speak with ENT regarding trache palcement 08/30/20-patient had SBT today she was awake and followed verbal communication but after 2 hours had severly labored breathing and thick high volume secretions. I have met with husband Garlan Fillers and he was able to see patient at bedside we discussed trache. I have updated Dr Tami Ribas regarding trache plan.  08/31/20- no airleak will need trach, discussed with Dr.Vaught. 09/01/20-Still no air leak. Patient has trach scheduled for Friday of this week and thus no need for steroids at this time 09/02/20- no air leak, continuing plan of care, no other concerns 10/8 plan for Trach today Consults:  Surgery  Procedures:  ETT 9/17 >> PICC 9/17 >>  Significant Diagnostic Tests:    Micro Data:  MRSA PCR 9/16 >> negative BC 9/16 >> negative  Antimicrobials:  Metronidazole 9/24 >> 9/29  CC  follow up respiratory failure  HPI Patient remains critically ill Prognosis is guarded   BP 106/75   Pulse 93   Temp 98.2 F (36.8 C) (Oral)   Resp (!) 23   Ht 5' 9.13" (1.756 m)   Wt 122.2 kg   LMP  (LMP Unknown)   SpO2 96%   BMI 39.63 kg/m    I/O last 3 completed shifts: In: 6283.1 [P.O.:350; I.V.:1221.5; NG/GT:4711.7] Out: 5825 [Urine:5825] No intake/output data recorded.  SpO2: 96 % O2 Flow Rate (L/min): 40 L/min FiO2 (%): 45 %  Estimated body mass index is 39.63 kg/m as calculated from the following:   Height as of this encounter: 5' 9.13" (1.756 m).   Weight as of this encounter: 122.2 kg.  SIGNIFICANT EVENTS   REVIEW OF SYSTEMS  PATIENT IS  UNABLE TO PROVIDE COMPLETE REVIEW OF SYSTEMS DUE TO SEVERE CRITICAL ILLNESS   Pressure Injury 09/01/20 Buttocks Right;Left Deep Tissue Pressure Injury - Purple or maroon localized area of discolored intact skin or blood-filled blister due to damage of underlying soft tissue from pressure and/or shear. Lower part of the bottocks. I (Active)  09/01/20 0800  Location: Buttocks   Location Orientation: Right;Left  Staging: Deep Tissue Pressure Injury - Purple or maroon localized area of discolored intact skin or blood-filled blister due to damage of underlying soft tissue from pressure and/or shear.  Wound Description (Comments): Lower part of the bottocks. It seems like from medical device. Maroon/purple streaks  Present on Admission:       PHYSICAL EXAMINATION:  GENERAL:critically ill appearing, +resp distress HEAD: Normocephalic, atraumatic.  EYES: Pupils equal, round, reactive to light.  No scleral icterus.  MOUTH: Moist mucosal membrane. NECK: Supple.  PULMONARY: +rhonchi, +wheezing CARDIOVASCULAR: S1 and S2. Regular rate and rhythm. No murmurs, rubs, or gallops.  GASTROINTESTINAL: Soft, nontender, -distended.  Positive bowel sounds.   MUSCULOSKELETAL: No swelling, clubbing, or edema.  NEUROLOGIC: obtunded, GCS<8 SKIN:intact,warm,dry  MEDICATIONS: I have reviewed all medications and confirmed regimen as documented     Nutrition Status: Nutrition Problem: Inadequate oral intake Etiology: inability to eat Signs/Symptoms: NPO status Interventions: Tube feeding     Indwelling Urinary Catheter continued, requirement due to   Reason to continue Indwelling Urinary Catheter strict Intake/Output monitoring for hemodynamic instability   Central Line/ continued, requirement due to  Reason to continue [place] of central venous pressure or other hemodynamic parameters and poor IV access   Ventilator continued, requirement due to severe respiratory failure   Ventilator Sedation RASS 0 to -2      ASSESSMENT AND PLAN SYNOPSIS Acute hypoxic respiratory failure secondary to COVID-19 pneumonia and ARDS Morbid Obesity, possible OSA with upper airway swelling  Severe ACUTE Hypoxic and Hypercapnic Respiratory Failure -continue Full MV support -continue Bronchodilator Therapy -Wean Fio2 and PEEP as tolerated -VAP/VENT bundle  implementation Plan for trach today   Morbid obesity, possible OSA.   Will certainly impact respiratory mechanics, ventilator weaning Will need trach    NEUROLOGY - intubated and sedated - minimal sedation to achieve a RASS goal: -1  CARDIAC ICU monitoring  GI GI PROPHYLAXIS as indicated  NUTRITIONAL STATUS Nutrition Status: Nutrition Problem: Inadequate oral intake Etiology: inability to eat Signs/Symptoms: NPO status Interventions: Tube feeding    ENDO - will use ICU hypoglycemic\Hyperglycemia protocol if indicated     ELECTROLYTES -follow labs as needed -replace as needed -pharmacy consultation and following   DVT/GI PRX ordered and assessed TRANSFUSIONS AS NEEDED MONITOR FSBS I Assessed the need for Labs I Assessed the need for Foley I Assessed the need for Central Venous Line Family Discussion when available I Assessed the need for Mobilization I made an Assessment of medications to be adjusted accordingly Safety Risk assessment completed   CASE DISCUSSED IN MULTIDISCIPLINARY ROUNDS WITH ICU TEAM  Critical Care Time devoted to patient care services described in this note is 32 minutes.   Overall, patient is critically ill, prognosis is guarded.    Corrin Parker, M.D.  [facility]  Medical Director [place] Director [facility] Cardio-Pulmonary Department

## 2020-09-05 DIAGNOSIS — J9601 Acute respiratory failure with hypoxia: Secondary | ICD-10-CM | POA: Diagnosis not present

## 2020-09-05 DIAGNOSIS — U071 COVID-19: Secondary | ICD-10-CM | POA: Diagnosis not present

## 2020-09-05 LAB — BASIC METABOLIC PANEL
Anion gap: 9 (ref 5–15)
BUN: 31 mg/dL — ABNORMAL HIGH (ref 6–20)
CO2: 30 mmol/L (ref 22–32)
Calcium: 9.5 mg/dL (ref 8.9–10.3)
Chloride: 98 mmol/L (ref 98–111)
Creatinine, Ser: 1.03 mg/dL — ABNORMAL HIGH (ref 0.44–1.00)
GFR, Estimated: >60 mL/min (ref >60)
Glucose, Bld: 109 mg/dL — ABNORMAL HIGH (ref 70–99)
Potassium: 4.1 mmol/L (ref 3.5–5.1)
Sodium: 137 mmol/L (ref 135–145)

## 2020-09-05 LAB — CBC WITH DIFFERENTIAL/PLATELET
Abs Immature Granulocytes: 0.06 10*3/uL (ref 0.00–0.07)
Basophils Absolute: 0 10*3/uL (ref 0.0–0.1)
Basophils Relative: 0 %
Eosinophils Absolute: 0.2 10*3/uL (ref 0.0–0.5)
Eosinophils Relative: 2 %
HCT: 32.8 % — ABNORMAL LOW (ref 36.0–46.0)
Hemoglobin: 10.2 g/dL — ABNORMAL LOW (ref 12.0–15.0)
Immature Granulocytes: 1 %
Lymphocytes Relative: 8 %
Lymphs Abs: 0.9 10*3/uL (ref 0.7–4.0)
MCH: 24.7 pg — ABNORMAL LOW (ref 26.0–34.0)
MCHC: 31.1 g/dL (ref 30.0–36.0)
MCV: 79.4 fL — ABNORMAL LOW (ref 80.0–100.0)
Monocytes Absolute: 0.7 10*3/uL (ref 0.1–1.0)
Monocytes Relative: 7 %
Neutro Abs: 8.9 10*3/uL — ABNORMAL HIGH (ref 1.7–7.7)
Neutrophils Relative %: 82 %
Platelets: 302 10*3/uL (ref 150–400)
RBC: 4.13 MIL/uL (ref 3.87–5.11)
RDW: 26.1 % — ABNORMAL HIGH (ref 11.5–15.5)
Smear Review: NORMAL
WBC: 10.8 10*3/uL — ABNORMAL HIGH (ref 4.0–10.5)
nRBC: 0 % (ref 0.0–0.2)

## 2020-09-05 LAB — PHOSPHORUS: Phosphorus: 5.9 mg/dL — ABNORMAL HIGH (ref 2.5–4.6)

## 2020-09-05 LAB — GLUCOSE, CAPILLARY
Glucose-Capillary: 124 mg/dL — ABNORMAL HIGH (ref 70–99)
Glucose-Capillary: 103 mg/dL — ABNORMAL HIGH (ref 70–99)
Glucose-Capillary: 106 mg/dL — ABNORMAL HIGH (ref 70–99)
Glucose-Capillary: 115 mg/dL — ABNORMAL HIGH (ref 70–99)
Glucose-Capillary: 122 mg/dL — ABNORMAL HIGH (ref 70–99)

## 2020-09-05 LAB — MAGNESIUM: Magnesium: 2.5 mg/dL — ABNORMAL HIGH (ref 1.7–2.4)

## 2020-09-05 NOTE — Progress Notes (Signed)
Assisted tele visit to patient with family member.  Achaia Garlock R, RN  

## 2020-09-05 NOTE — Progress Notes (Signed)
CRITICAL CARE NOTE 44 yo unvaccinated female admitted with COVID-19 pneumonia & ARDS requiring intubation and mechanical ventilation.  Past Medical History  Thyroid disease Anemia  Significant Hospital Events   09/16: Pt admitted to ICU requiring Heated HFNC and NRB, however remained in the ER pending ICU bed availability  09/16: CT Head revealed no acute intracranial abnormality 09/17: Pt with worsening hypoxia requiring mechanical intubation 09/18: [facility] refused to accept pt for ECMO 09/21: Attempted to transfer pt to [facility] and [place] [facility] for possible assessment for ECMO, however both hospitals DO NOT have bed availability and are at capacity and pt may or may NOT be an ECMO candidate at this time  09/23: Pt unproned  09/24: Resumed proning protocol 09/25: Resumed proning protocol  09/26: Pt unproned 9/27-No onvernight events, patient remains in ARDS on elevated settings. 08/25/20- patient is on maximal settings with sedation. She is on day 11 of her illness with little improvement. Ive attempted to reach husband via cell no answer, ive successfully found her daughter and spoke with her regarding care plan and she is agreeable but requested for me to speak with [name] and has provided me with secondary phone number [phone] but again only voice mail was reachable. Have left voicemail and will discuss trache with family when they are available.  08/26/20-patient without overnight events, ive called POA husband again today but was unable to reach him and left voice mail.  08/27/20-patient critically ill with overall poor prognosis, she had desaturation event today and was difficult to improve requiring multiple changes on ventilator by RT.  08/28/20- Patient is weaned to 60% FiO2 on MV. Have called Garlan Fillers today and updated him. We discussed trache and he is agreeable.  08/29/20- Spoke with husband today, answered questions ,updated on care plan, he is  ready to speak with ENT regarding trache palcement 08/30/20-patient had SBT today she was awake and followed verbal communication but after 2 hours had severly labored breathing and thick high volume secretions. I have met with husband Garlan Fillers and he was able to see patient at bedside we discussed trache. I have updated Dr Tami Ribas regarding trache plan.  08/31/20- no airleak will need trach, discussed with Dr.Vaught. 09/01/20-Still no air leak. Patient has trach scheduled for Friday of this week and thus no need for steroids at this time 09/02/20- no air leak, continuing plan of care, no other concerns 10/8 plan for Trach today, s/p trach Consults:  Surgery  Procedures:  ETT 9/17 >> PICC 9/17 >>  Significant Diagnostic Tests:    Micro Data:  MRSA PCR 9/16 >> negative [facility] 9/16 >> negative  Antimicrobials:  Metronidazole 9/24 >> 9/29   CC  follow up respiratory failure  SUBJECTIVE Patient remains critically ill S/p trach On sedation   BP 97/74   Pulse 81   Temp 99.8 F (37.7 C) (Oral)   Resp 20   Ht 5' 9.13" (1.756 m)   Wt 113.2 kg   LMP  (LMP Unknown)   SpO2 99%   BMI 36.71 kg/m    I/O last 3 completed shifts: In: 2148.4 [I.V.:1486.7; NG/GT:661.7] Out: 3350 [Urine:3350] No intake/output data recorded.  SpO2: 99 % O2 Flow Rate (L/min): 40 L/min FiO2 (%): 45 %  Estimated body mass index is 36.71 kg/m as calculated from the following:   Height as of this encounter: 5' 9.13" (1.756 m).   Weight as of this encounter: 113.2 kg.  SIGNIFICANT EVENTS   REVIEW OF SYSTEMS  PATIENT IS UNABLE TO PROVIDE COMPLETE REVIEW OF SYSTEMS DUE TO SEVERE CRITICAL ILLNESS   Pressure Injury 09/01/20 Buttocks Right;Left Deep Tissue Pressure Injury - Purple or maroon localized area of discolored intact skin or blood-filled blister due to damage of underlying soft tissue from pressure and/or shear. Lower part of the bottocks. I (Active)  09/01/20 0800  Location:  Buttocks  Location Orientation: Right;Left  Staging: Deep Tissue Pressure Injury - Purple or maroon localized area of discolored intact skin or blood-filled blister due to damage of underlying soft tissue from pressure and/or shear.  Wound Description (Comments): Lower part of the bottocks. It seems like from medical device. Maroon/purple streaks  Present on Admission:       PHYSICAL EXAMINATION:  GENERAL:critically ill appearing, +resp distress HEAD: Normocephalic, atraumatic.  EYES: Pupils equal, round, reactive to light.  No scleral icterus.  NECK: Supple. S/p trach PULMONARY: +rhonchi, +wheezing CARDIOVASCULAR: S1 and S2. Regular rate and rhythm. No murmurs, rubs, or gallops.  GASTROINTESTINAL: Soft, nontender, -distended.  Positive bowel sounds.   MUSCULOSKELETAL: No swelling, clubbing, or edema.  NEUROLOGIC: obtunded, GCS<8 SKIN:intact,warm,dry  MEDICATIONS: I have reviewed all medications and confirmed regimen as documented     Nutrition Status: Nutrition Problem: Inadequate oral intake Etiology: inability to eat Signs/Symptoms: NPO status Interventions: Tube feeding     Indwelling Urinary Catheter continued, requirement due to   Reason to continue Indwelling Urinary Catheter strict Intake/Output monitoring for hemodynamic instability   Central Line/ continued, requirement due to  Reason to continue [place] of central venous pressure or other hemodynamic parameters and poor IV access   Ventilator continued, requirement due to severe respiratory failure   Ventilator Sedation RASS 0 to -2      ASSESSMENT AND PLAN SYNOPSIS Acute hypoxic respiratory failure secondary to COVID-19 pneumonia and ARDS Morbid Obesity, possible OSA with upper airway swelling s/p trach   Severe ACUTE Hypoxic and Hypercapnic Respiratory Failure S/p trach -continue Full MV support -continue Bronchodilator Therapy -Wean Fio2 and PEEP as tolerated -will perform  SAT/SBT when respiratory parameters are met -VAP/VENT bundle implementation   Morbid obesity, possible OSA.   Will certainly impact respiratory mechanics, ventilator weaning    NEUROLOGY - intubated and sedated - minimal sedation to achieve a RASS goal: -1 Wake up assessment pending   CARDIAC ICU monitoring  ID -continue IV abx as prescibed -follow up cultures  GI GI PROPHYLAXIS as indicated  DIET-->TF's as tolerated Constipation protocol as indicated  ENDO - will use ICU hypoglycemic\Hyperglycemia protocol if indicated     ELECTROLYTES -follow labs as needed -replace as needed -pharmacy consultation and following   DVT/GI PRX ordered and assessed TRANSFUSIONS AS NEEDED MONITOR FSBS I Assessed the need for Labs I Assessed the need for Foley I Assessed the need for Central Venous Line Family Discussion when available I Assessed the need for Mobilization I made an Assessment of medications to be adjusted accordingly Safety Risk assessment completed    Critical Care Time devoted to patient care services described in this note is 34 minutes.   Overall, patient is critically ill, prognosis is guarded.    Corrin Parker, M.D.  [facility]  Medical Director [place] Director [facility] Cardio-Pulmonary Department

## 2020-09-06 DIAGNOSIS — J9601 Acute respiratory failure with hypoxia: Secondary | ICD-10-CM | POA: Diagnosis not present

## 2020-09-06 DIAGNOSIS — U071 COVID-19: Secondary | ICD-10-CM | POA: Diagnosis not present

## 2020-09-06 DIAGNOSIS — G9340 Encephalopathy, unspecified: Secondary | ICD-10-CM | POA: Diagnosis not present

## 2020-09-06 LAB — BASIC METABOLIC PANEL
Anion gap: 11 (ref 5–15)
BUN: 31 mg/dL — ABNORMAL HIGH (ref 6–20)
CO2: 29 mmol/L (ref 22–32)
Calcium: 9.3 mg/dL (ref 8.9–10.3)
Chloride: 98 mmol/L (ref 98–111)
Creatinine, Ser: 1.06 mg/dL — ABNORMAL HIGH (ref 0.44–1.00)
GFR, Estimated: >60 mL/min (ref >60)
Glucose, Bld: 125 mg/dL — ABNORMAL HIGH (ref 70–99)
Potassium: 3.8 mmol/L (ref 3.5–5.1)
Sodium: 138 mmol/L (ref 135–145)

## 2020-09-06 LAB — CBC WITH DIFFERENTIAL/PLATELET
Abs Immature Granulocytes: 0.08 10*3/uL — ABNORMAL HIGH (ref 0.00–0.07)
Basophils Absolute: 0 10*3/uL (ref 0.0–0.1)
Basophils Relative: 0 %
Eosinophils Absolute: 0.1 10*3/uL (ref 0.0–0.5)
Eosinophils Relative: 1 %
HCT: 32.5 % — ABNORMAL LOW (ref 36.0–46.0)
Hemoglobin: 10 g/dL — ABNORMAL LOW (ref 12.0–15.0)
Immature Granulocytes: 1 %
Lymphocytes Relative: 6 %
Lymphs Abs: 0.9 10*3/uL (ref 0.7–4.0)
MCH: 24.6 pg — ABNORMAL LOW (ref 26.0–34.0)
MCHC: 30.8 g/dL (ref 30.0–36.0)
MCV: 79.9 fL — ABNORMAL LOW (ref 80.0–100.0)
Monocytes Absolute: 1.1 10*3/uL — ABNORMAL HIGH (ref 0.1–1.0)
Monocytes Relative: 7 %
Neutro Abs: 13.1 10*3/uL — ABNORMAL HIGH (ref 1.7–7.7)
Neutrophils Relative %: 85 %
Platelets: 280 10*3/uL (ref 150–400)
RBC: 4.07 MIL/uL (ref 3.87–5.11)
RDW: 25.6 % — ABNORMAL HIGH (ref 11.5–15.5)
WBC: 15.2 10*3/uL — ABNORMAL HIGH (ref 4.0–10.5)
nRBC: 0 % (ref 0.0–0.2)

## 2020-09-06 LAB — PHOSPHORUS: Phosphorus: 4.7 mg/dL — ABNORMAL HIGH (ref 2.5–4.6)

## 2020-09-06 LAB — GLUCOSE, CAPILLARY
Glucose-Capillary: 102 mg/dL — ABNORMAL HIGH (ref 70–99)
Glucose-Capillary: 109 mg/dL — ABNORMAL HIGH (ref 70–99)
Glucose-Capillary: 112 mg/dL — ABNORMAL HIGH (ref 70–99)
Glucose-Capillary: 113 mg/dL — ABNORMAL HIGH (ref 70–99)
Glucose-Capillary: 113 mg/dL — ABNORMAL HIGH (ref 70–99)
Glucose-Capillary: 119 mg/dL — ABNORMAL HIGH (ref 70–99)

## 2020-09-06 LAB — MAGNESIUM: Magnesium: 2.4 mg/dL (ref 1.7–2.4)

## 2020-09-06 NOTE — Progress Notes (Signed)
Patient alert and oriented. Tolerating trach/vent.  Mouthing words and needs.  PRN pain med given x 1 for surgical pain.  No acute distress noted.  Adequate urine output.  Will continue to monitor.

## 2020-09-06 NOTE — Progress Notes (Signed)
CRITICAL CARE NOTE 44 yo unvaccinated female admitted with COVID-19 pneumonia & ARDS requiring intubation and mechanical ventilation.  Past Medical History  Thyroid disease Anemia  Significant Hospital Events   09/16: Pt admitted to ICU requiring Heated HFNC and NRB, however remained in the ER pending ICU bed availability  09/16: CT Head revealed no acute intracranial abnormality 09/17: Pt with worsening hypoxia requiring mechanical intubation 09/18: [facility] refused to accept pt for ECMO 09/21: Attempted to transfer pt to [facility] [place] [facility] for possible assessment for ECMO, however both hospitals DO NOT have bed availability and are at capacity and pt may or may NOT be an ECMO candidate at this time  09/23: Pt unproned  09/24: Resumed proning protocol 09/25: Resumed proning protocol  09/26: Pt unproned 9/27-No onvernight events, patient remains in ARDS on elevated settings. 08/25/20- patient is on maximal settings with sedation. She is on day 11 of her illness with little improvement. Ive attempted to reach husband via cell no answer, ive successfully found her daughter and spoke with her regarding care plan and she is agreeable but requested for me to speak with [name] and has provided me with secondary phone number [phone] but again only voice mail was reachable. Have left voicemail and will discuss trache with family when they are available.  08/26/20-patient without overnight events, ive called POA husband again today but was unable to reach him and left voice mail.  08/27/20-patient critically ill with overall poor prognosis, she had desaturation event today and was difficult to improve requiring multiple changes on ventilator by RT.  08/28/20- Patient is weaned to 60% FiO2 on MV. Have called Garlan Fillers today and updated him. We discussed trache and he is agreeable.  08/29/20- Spoke with husband today, answered questions ,updated on care plan, he is  ready to speak with ENT regarding trache palcement 08/30/20-patient had SBT today she was awake and followed verbal communication but after 2 hours had severly labored breathing and thick high volume secretions. I have met with husband Garlan Fillers and he was able to see patient at bedside we discussed trache. I have updated Dr Tami Ribas regarding trache plan.  08/31/20- no airleak will need trach, discussed with Dr.Vaught. 09/01/20-Still no air leak. Patient has trach scheduled for Friday of this week and thus no need for steroids at this time 09/02/20- no air leak, continuing plan of care, no other concerns 10/8 plan for Trach today, s/p trach Consults:  Surgery  Procedures:  ETT 9/17 >> PICC 9/17 >>  Significant Diagnostic Tests:    Micro Data:  MRSA PCR 9/16 >> negative BC 9/16 >> negative  Antimicrobials:  Metronidazole 9/24 >> 9/29   CC  Follow up resp failure  SUBJECTIVE S/p trach on vent Try PS mode today Wean off sedation    BP 105/70 (BP Location: Right Wrist)    Pulse 100    Temp (!) 100.9 F (38.3 C) (Oral)    Resp (!) 23    Ht 5' 9.13" (1.756 m)    Wt 113.2 kg    LMP  (LMP Unknown)    SpO2 100%    BMI 36.71 kg/m    I/O last 3 completed shifts: In: 758.7 [I.V.:758.7] Out: 3700 [Urine:3700] No intake/output data recorded.  SpO2: 100 % O2 Flow Rate (L/min): 40 L/min FiO2 (%): 45 %  Estimated body mass index is 36.71 kg/m as calculated from the following:   Height as of this encounter: 5' 9.13" (1.756 m).  Weight as of this encounter: 113.2 kg.   REVIEW OF SYSTEMS  PATIENT IS UNABLE TO PROVIDE COMPLETE REVIEW OF SYSTEM S DUE TO SEVERE CRITICAL ILLNESS AND ENCEPHALOPATHY    Pressure Injury 09/01/20 Buttocks Right;Left Deep Tissue Pressure Injury - Purple or maroon localized area of discolored intact skin or blood-filled blister due to damage of underlying soft tissue from pressure and/or shear. Lower part of the bottocks. I (Active)   09/01/20 0800  Location: Buttocks  Location Orientation: Right;Left  Staging: Deep Tissue Pressure Injury - Purple or maroon localized area of discolored intact skin or blood-filled blister due to damage of underlying soft tissue from pressure and/or shear.  Wound Description (Comments): Lower part of the bottocks. It seems like from medical device. Maroon/purple streaks  Present on Admission:     PHYSICAL EXAMINATION:  GENERAL:critically ill appearing, +resp distress NECK: Supple. S/p trach PULMONARY: +rhonchi, +wheezing CARDIOVASCULAR: S1 and S2. Regular rate and rhythm. No murmurs, rubs, or gallops.  GASTROINTESTINAL: Soft, nontender, -distended. Positive bowel sounds.  MUSCULOSKELETAL: No swelling, clubbing, or edema.  NEUROLOGIC: sedated SKIN:intact,warm,dry     Nutrition Status: Nutrition Problem: Inadequate oral intake Etiology: inability to eat Signs/Symptoms: NPO status Interventions: Tube feeding    Ventilator continued, requirement due to severe respiratory failure   Ventilator Sedation RASS 0 to -2      ASSESSMENT AND PLAN SYNOPSIS Acute hypoxic respiratory failure secondary to COVID-19 pneumonia and ARDS Morbid Obesity, possible OSA with upper airway swelling s/p trach   Severe ACUTE Hypoxic and Hypercapnic Respiratory Failure S/p trach Wean vent as tolerated   Morbid obesity, possible OSA.   Will certainly impact respiratory mechanics, ventilator weaning    ELECTROLYTES -follow labs as needed -replace as needed -pharmacy consultation and following   DVT/GI PRX ordered and assessed TRANSFUSIONS AS NEEDED MONITOR FSBS I Assessed the need for Labs I Assessed the need for Foley I Assessed the need for Central Venous Line Family Discussion when available I Assessed the need for Mobilization I made an Assessment of medications to be adjusted accordingly Safety Risk assessment completed  CASE DISCUSSED IN MULTIDISCIPLINARY ROUNDS WITH  ICU TEAM     Critical Care Time devoted to patient care services described in this note is 32 minutes.   Overall, patient is critically ill, prognosis is guarded.    Corrin Parker, M.D.  [facility]  Medical Director [place] Director [facility] Cardio-Pulmonary Department

## 2020-09-06 NOTE — Progress Notes (Signed)
Assisted tele visit to patient with family member.  Princesa Willig Harold, RN  

## 2020-09-07 ENCOUNTER — Inpatient Hospital Stay: Payer: Medicaid Other

## 2020-09-07 ENCOUNTER — Encounter: Payer: Self-pay | Admitting: Unknown Physician Specialty

## 2020-09-07 LAB — BASIC METABOLIC PANEL
Anion gap: 10 (ref 5–15)
BUN: 26 mg/dL — ABNORMAL HIGH (ref 6–20)
CO2: 29 mmol/L (ref 22–32)
Calcium: 9.3 mg/dL (ref 8.9–10.3)
Chloride: 102 mmol/L (ref 98–111)
Creatinine, Ser: 0.94 mg/dL (ref 0.44–1.00)
GFR, Estimated: >60 mL/min (ref >60)
Glucose, Bld: 115 mg/dL — ABNORMAL HIGH (ref 70–99)
Potassium: 3.7 mmol/L (ref 3.5–5.1)
Sodium: 141 mmol/L (ref 135–145)

## 2020-09-07 LAB — CBC WITH DIFFERENTIAL/PLATELET
Abs Immature Granulocytes: 0.08 10*3/uL — ABNORMAL HIGH (ref 0.00–0.07)
Basophils Absolute: 0 10*3/uL (ref 0.0–0.1)
Basophils Relative: 0 %
Eosinophils Absolute: 0.1 10*3/uL (ref 0.0–0.5)
Eosinophils Relative: 1 %
HCT: 30.7 % — ABNORMAL LOW (ref 36.0–46.0)
Hemoglobin: 9.3 g/dL — ABNORMAL LOW (ref 12.0–15.0)
Immature Granulocytes: 1 %
Lymphocytes Relative: 6 %
Lymphs Abs: 0.9 10*3/uL (ref 0.7–4.0)
MCH: 24.6 pg — ABNORMAL LOW (ref 26.0–34.0)
MCHC: 30.3 g/dL (ref 30.0–36.0)
MCV: 81.2 fL (ref 80.0–100.0)
Monocytes Absolute: 1.1 10*3/uL — ABNORMAL HIGH (ref 0.1–1.0)
Monocytes Relative: 8 %
Neutro Abs: 11.6 10*3/uL — ABNORMAL HIGH (ref 1.7–7.7)
Neutrophils Relative %: 84 %
Platelets: 248 10*3/uL (ref 150–400)
RBC: 3.78 MIL/uL — ABNORMAL LOW (ref 3.87–5.11)
RDW: 25.5 % — ABNORMAL HIGH (ref 11.5–15.5)
WBC: 13.7 10*3/uL — ABNORMAL HIGH (ref 4.0–10.5)
nRBC: 0 % (ref 0.0–0.2)

## 2020-09-07 LAB — GLUCOSE, CAPILLARY
Glucose-Capillary: 102 mg/dL — ABNORMAL HIGH (ref 70–99)
Glucose-Capillary: 103 mg/dL — ABNORMAL HIGH (ref 70–99)
Glucose-Capillary: 111 mg/dL — ABNORMAL HIGH (ref 70–99)
Glucose-Capillary: 115 mg/dL — ABNORMAL HIGH (ref 70–99)
Glucose-Capillary: 121 mg/dL — ABNORMAL HIGH (ref 70–99)
Glucose-Capillary: 125 mg/dL — ABNORMAL HIGH (ref 70–99)
Glucose-Capillary: 128 mg/dL — ABNORMAL HIGH (ref 70–99)

## 2020-09-07 LAB — PROCALCITONIN: Procalcitonin: 0.46 ng/mL

## 2020-09-07 LAB — MAGNESIUM: Magnesium: 2.5 mg/dL — ABNORMAL HIGH (ref 1.7–2.4)

## 2020-09-07 LAB — PHOSPHORUS: Phosphorus: 3.9 mg/dL (ref 2.5–4.6)

## 2020-09-07 MED ORDER — FREE WATER
200.0000 mL | Freq: Four times a day (QID) | Status: DC
  Administered 2020-09-08 – 2020-09-11 (×9): 200 mL

## 2020-09-07 MED ORDER — SIMETHICONE 40 MG/0.6ML PO SUSP
40.0000 mg | Freq: Four times a day (QID) | ORAL | Status: DC | PRN
  Filled 2020-09-07: qty 0.6

## 2020-09-07 MED ORDER — VITAL 1.5 CAL PO LIQD
1000.0000 mL | ORAL | Status: DC
  Administered 2020-09-07 – 2020-09-09 (×3): 1000 mL

## 2020-09-07 MED ORDER — PROSOURCE TF PO LIQD
45.0000 mL | Freq: Three times a day (TID) | ORAL | Status: DC
  Administered 2020-09-07 – 2020-09-10 (×10): 45 mL
  Filled 2020-09-07 (×14): qty 45

## 2020-09-07 MED ORDER — MORPHINE SULFATE (PF) 2 MG/ML IV SOLN
2.0000 mg | INTRAVENOUS | Status: DC | PRN
  Administered 2020-09-11: 2 mg via INTRAVENOUS
  Filled 2020-09-07: qty 1

## 2020-09-07 MED ORDER — ALPRAZOLAM 0.25 MG PO TABS
0.2500 mg | ORAL_TABLET | Freq: Two times a day (BID) | ORAL | Status: DC | PRN
  Administered 2020-09-07 – 2020-09-10 (×4): 0.25 mg via ORAL
  Filled 2020-09-07 (×4): qty 1

## 2020-09-07 MED ORDER — SODIUM CHLORIDE 0.9 % IV SOLN
2.0000 g | Freq: Three times a day (TID) | INTRAVENOUS | Status: AC
  Administered 2020-09-07 – 2020-09-14 (×21): 2 g via INTRAVENOUS
  Filled 2020-09-07 (×23): qty 2

## 2020-09-07 MED ORDER — ENOXAPARIN SODIUM 40 MG/0.4ML ~~LOC~~ SOLN
40.0000 mg | SUBCUTANEOUS | Status: DC
  Administered 2020-09-07 – 2020-09-14 (×8): 40 mg via SUBCUTANEOUS
  Filled 2020-09-07 (×8): qty 0.4

## 2020-09-07 NOTE — Progress Notes (Addendum)
Nutrition Follow-up  DOCUMENTATION CODES:   Obesity unspecified  INTERVENTION:   Vital 1.5 Cal at 70 mL/hr- Initiate at 28m/hr and increase by 142mhr q 8 hours until goal rate is reached.   Pro-Source 4545mID via tube, provides 40kcal and 11g of protein per serving   Free water flushes 200m27m hours   Provides 2640 kcal, 146 grams of protein, 2077 mL H2O daily.  NUTRITION DIAGNOSIS:   Inadequate oral intake related to inability to eat as evidenced by NPO status. Ongoing.  GOAL:   Patient will meet greater than or equal to 90% of their needs Met with TF regimen.  MONITOR:   Vent status, Labs, Weight trends, TF tolerance, I & O's  ASSESSMENT:   43 y15r old female admitted with COVID-19 PNA.   Pt s/p trach 10/8. NGT placed 10/11. Will plan to continue tube feed regimen at goal rate. Refeed labs stable. Per chart, it is difficult to determine if pt has had any weight changes as her documented weights have varied.   Addendum 1400- RN called and reports pt developed nausea after receiving ~70ml82mtube feeds; zofran given and tube feeds decreased to 20ml/58mWill plan to advance tube feeds slowly as tolerated.   Medications reviewed and include: lovenox, pepcid, lasix, insulin  Labs reviewed: K 3.7 wnl, BUN 26(H), P 3.9 wnl, Mg 2.5(H) Wbc- 13.7(H), Hgb 9.3(L), Hct 30.7(L) cbgs- 121, 128, 11, 102 x 24hrs  Patient is currently intubated on ventilator support MV: 11.5 L/min Temp (24hrs), Avg:100 F (37.8 C), Min:99 F (37.2 C), Max:100.9 F (38.3 C)  Propofol: none  MAP- >65mmHg57mP- 1350ml  D73mOrder:   Diet Order    None     EDUCATION NEEDS:   No education needs have been identified at this time  Skin:  Skin Assessment: Skin Integrity Issues: Skin Integrity Issues:: DTI, Other (Comment) DTI: buttocks Other: blister to buttocks  Last BM:  10/11- type 7  Height:   Ht Readings from Last 1 Encounters:  09/04/20 5' 9.13" (1.756 m)   Weight:    Wt Readings from Last 1 Encounters:  09/05/20 113.2 kg   Ideal Body Weight:  65.9 kg  BMI:  Body mass index is 36.71 kg/m.  Estimated Nutritional Needs:   Kcal:  2600-2800  Protein:  145-155 grams  Fluid:  >/= 2 L/day  [place] Ca[place]oleen Distance LDN Please refer to [facility]/or RD on-call/weekend/after hours pager

## 2020-09-07 NOTE — Progress Notes (Signed)
Patient complained of anxiety.  Dr. Mortimer Fries gave order for xanax.

## 2020-09-07 NOTE — Progress Notes (Signed)
Pharmacy Antibiotic Note  Kathy Vega is a 44 y.o. female admitted on 08/13/2020 with COVID-19 PNA.  Pharmacy has been consulted for cefepime dosing. Pt s/p trach 10/8. NGT placed 10/11. CXR shows worsened left lower lobe aeration with an elevated procalcitonin and WBC along with low-grade fevers  Plan:   Start cefepime 2 grams IV every 8 hours  Height: 5' 9.13" (175.6 cm) Weight: 113.2 kg (249 lb 9 oz) IBW/kg (Calculated) : 66.51  Temp (24hrs), Avg:100 F (37.8 C), Min:99 F (37.2 C), Max:100.9 F (38.3 C)  Recent Labs  Lab 09/03/20 0426 09/04/20 0448 09/05/20 0500 09/06/20 0425 09/07/20 0526  WBC 10.0 8.7 10.8* 15.2* 13.7*  CREATININE 0.91 0.90 1.03* 1.06* 0.94    Estimated Creatinine Clearance: 102.7 mL/min (by C-G formula based on SCr of 0.94 mg/dL).    No Known Allergies  Antimicrobials this admission: cefepime 10/11 >>   Microbiology results: 9/16 BCx: NG final 10/11 Sputum: pending  9/16 MRSA PCR: negative  Thank you for allowing pharmacy to be a part of this patient's care.  Dallie Piles 09/07/2020 2:55 PM

## 2020-09-07 NOTE — Evaluation (Signed)
Occupational Therapy Evaluation Patient Details Name: Kathy Vega MRN: [id number] DOB: 23-May-1976 Today's Date: 09/07/2020    History of Present Illness Pt is a 44 y.o. female presenting to hospital 9/16 with SOB and confusion; initial O2 sats in 20's; pt COVID (+) 6 days prior.  Mechanically intubated 9/17.  S/p trach 10/8.  Pt admitted with acute hypoxic respiratory failure secondary COVID-19 PNA and ARDS, possible OSA with upper airway swelling s/p trach, and severe acute hypoxic and hypercapnic respiratory failure.  PMH includes anemia, thyroid disease, h/o leg surgery.   Clinical Impression   Patient presenting with decreased I in self care, balance, functional mobility/transfers, endurance, safety awareness, and strength.  Patient completely independent PTA and living with family. Patient greets therapist with a smile on her face and is very motivated for therapeutic intervention.Patient was able to follow 1 step commands for supine <>sit with max A for trunk support and to get B LEs fully off the bed. Pt initially needing mod A for static sitting balance and then able to hold self up for 8 minutes with min guard and maintaining midline orientation. Pt does fatigue quickly but is very Scientist, research (physical sciences). HR increased to 140's with activity and 120's at rest.  Patient will benefit from acute OT to increase overall independence in the areas of ADLs, functional mobility, and safety awareness in order to safely discharge to next venue of care. OT recommending CIR based on pt's motivation and effort to recover and expect with time fatigue will improve.    Follow Up Recommendations  CIR;Supervision/Assistance - 24 hour    Equipment Recommendations  Other (comment) (defer to next venue of care)    Recommendations for Other Services Rehab consult     Precautions / Restrictions Precautions Precautions: Fall Precaution Comments: Trach (mech vent); HOB >30 degrees; Dobhoff Restrictions Weight  Bearing Restrictions: No      Mobility Bed Mobility Overal bed mobility: Needs Assistance Bed Mobility: Rolling;Supine to Sit;Sit to Supine Rolling: Mod assist   Supine to sit: Max assist Sit to supine: Max assist   General bed mobility comments: Pt following command for 1 step directions and putting forth good effort this session  Transfers      General transfer comment: deferred for safety    Balance Overall balance assessment: Needs assistance Sitting-balance support: Bilateral upper extremity supported;Feet supported Sitting balance-Leahy Scale: Fair Sitting balance - Comments: pt initially requiring B hands holding onto recliner armrests to maintain sitting balance and then able to progress B forearms resting on armrests and then B hands in lap (for static sitting); then able to progress pt with focusing on more upright sitting posture (pt requiring initial cueing to shift weight forward d/t posterior lean) and then dynamic sitting balance (shifting weight foward/back and then side to side) Postural control: Posterior lean            ADL either performed or assessed with clinical judgement   ADL Overall ADL's : Needs assistance/impaired     Grooming: Wash/dry hands;Wash/dry face;Oral care;Minimal assistance;Sitting   Upper Body Bathing: Minimal assistance;Standing       Upper Body Dressing : Moderate assistance;Bed level   Lower Body Dressing: Maximal assistance;Total assistance;Bed level      General ADL Comments: Pt with gross strengh of 3-/5 throughout but fatigues quickly with self are tasks     Vision Patient Visual Report: No change from baseline              Pertinent Vitals/Pain Pain Assessment:  Faces Faces Pain Scale: Hurts a little bit Pain Location: trach site and NG tube (back of throat) discomfort Pain Descriptors / Indicators: Discomfort Pain Intervention(s): Limited activity within patient's tolerance;Monitored during  session;Repositioned     Hand Dominance Right   Extremity/Trunk Assessment Upper Extremity Assessment Upper Extremity Assessment: Generalized weakness   Lower Extremity Assessment Lower Extremity Assessment: Defer to PT evaluation RLE Deficits / Details: hip flexion and knee flexion/extension 4/5; DF 4+/5; at least 3/5 PF LLE Deficits / Details: hip flexion and knee flexion/extension 4+/5; DF 4+/5; at least 3/5 PF   Cervical / Trunk Assessment Cervical / Trunk Assessment: Normal   Communication Communication Communication: Tracheostomy   Cognition Arousal/Alertness: Awake/alert Behavior During Therapy: WFL for tasks assessed/performed Overall Cognitive Status: Within Functional Limits for tasks assessed                         Home Living Family/patient expects to be discharged to:: Private residence Living Arrangements: Spouse/significant other;Children (87,69,81 year olds) Available Help at Discharge: Family Type of Home: House Home Access: Level entry     Home Layout: One level     Bathroom Shower/Tub: Occupational psychologist: Standard     Home Equipment: None          Prior Functioning/Environment Level of Independence: Independent        Comments: No falls in past 6 months.        OT Problem List: Decreased strength;Decreased coordination;Cardiopulmonary status limiting activity;Decreased safety awareness;Decreased activity tolerance;Decreased knowledge of use of DME or AE;Impaired balance (sitting and/or standing);Decreased knowledge of precautions      OT Treatment/Interventions: Self-care/ADL training;Therapeutic exercise;Therapeutic activities;Energy conservation;Patient/family education;DME and/or AE instruction;Balance training;Manual therapy    OT Goals(Current goals can be found in the care plan section) Acute Rehab OT Goals Patient Stated Goal: to get better OT Goal Formulation: With patient Time For Goal Achievement:  09/21/20 Potential to Achieve Goals: Good ADL Goals Pt Will Perform Grooming: with set-up Pt Will Perform Upper Body Bathing: with min assist;sitting Pt Will Perform Lower Body Dressing: with mod assist;sit to/from stand Pt Will Transfer to Toilet: with mod assist Pt Will Perform Toileting - Clothing Manipulation and hygiene: with mod assist  OT Frequency: Min 2X/week           AM-PAC OT "6 Clicks" Daily Activity     Outcome Measure Help from another person eating meals?: None Help from another person taking care of personal grooming?: A Little Help from another person toileting, which includes using toliet, bedpan, or urinal?: Total Help from another person bathing (including washing, rinsing, drying)?: A Lot Help from another person to put on and taking off regular upper body clothing?: A Lot Help from another person to put on and taking off regular lower body clothing?: Total 6 Click Score: 13   End of Session Nurse Communication: Mobility status;Precautions  Activity Tolerance: Patient limited by fatigue Patient left: in bed;with call bell/phone within reach;with bed alarm set  OT Visit Diagnosis: Muscle weakness (generalized) (M62.81);Unsteadiness on feet (R26.81)                Time: 1610-9604 OT Time Calculation (min): 30 min Charges:  OT General Charges $OT Visit: 1 Visit OT Evaluation $OT Eval High Complexity: 1 High OT Treatments $Therapeutic Activity: 8-22 mins   Darleen Crocker, MS, OTR/L , CBIS ascom [phone]  09/07/20, 3:25 PM   09/07/2020, 3:25 PM

## 2020-09-07 NOTE — Progress Notes (Signed)
Patient out of bed to chair with lift.  Assisted by Carron Brazen, CNA and [place].  Patient alert and oriented and very excited to get out of bed.

## 2020-09-07 NOTE — Evaluation (Signed)
Physical Therapy Evaluation Patient Details Name: Kathy Vega MRN: [id number] DOB: [birth year] Today's Date: 09/07/2020   History of Present Illness  Pt is a 44 y.o. female presenting to hospital 9/16 with SOB and confusion; initial O2 sats in 20's; pt COVID (+) 6 days prior.  Mechanically intubated 9/17.  S/p trach 10/8.  Pt admitted with acute hypoxic respiratory failure secondary COVID-19 PNA and ARDS, possible OSA with upper airway swelling s/p trach, and severe acute hypoxic and hypercapnic respiratory failure.  PMH includes anemia, thyroid disease, h/o leg surgery.  Clinical Impression  Prior to hospital admission, pt was independent with functional mobility; lives with her husband and 3 children (80, 60, and 28 y.o.) in 1 level home with level entry.  Pt reporting discomfort in back of throat (d/t dobhoff tube) and trach site during session.  R LE mildly weaker than L LE.  Pt had already been hoyered to recliner upon PT arrival (pt's first time out of bed) and therapy focused on upright sitting balance and posture (good improvement noted with cueing and practice).  Pt's HR 122 bpm at rest and increased to 134 bpm with activity.  Will plan to progress pt with functional mobility next session. Pt appearing motivated to participate in therapy and pt was smiling when she was able to do various tasks during session.  Pt would benefit from skilled PT to address noted impairments and functional limitations (see below for any additional details).  Upon hospital discharge, pt would benefit from CIR (anticipate pt will be able to progress to being able to tolerate 3 hours of therapy a day).    Follow Up Recommendations CIR    Equipment Recommendations   (TBD at next facility)    Recommendations for Other Services OT consult     Precautions / Restrictions Precautions Precautions: Fall Precaution Comments: Trach (mech vent); HOB >30 degrees; Dobhoff Restrictions Weight Bearing Restrictions: No       Mobility  Bed Mobility               General bed mobility comments: Deferred (pt sitting in recliner beginning/end of session; nursing had hoyered pt to recliner)  Transfers                 General transfer comment: Deferred  Ambulation/Gait             General Gait Details: Deferred  Stairs            Wheelchair Mobility    Modified Rankin (Stroke Patients Only)       Balance Overall balance assessment: Needs assistance Sitting-balance support: Bilateral upper extremity supported;Feet supported Sitting balance-Leahy Scale: Fair Sitting balance - Comments: pt initially requiring B hands holding onto recliner armrests to maintain sitting balance and then able to progress B forearms resting on armrests and then B hands in lap (for static sitting); then able to progress pt with focusing on more upright sitting posture (pt requiring initial cueing to shift weight forward d/t posterior lean) and then dynamic sitting balance (shifting weight foward/back and then side to side) Postural control: Posterior lean                                   Pertinent Vitals/Pain Pain Assessment: Faces Faces Pain Scale: Hurts a little bit Pain Location: trach site and NG tube (back of throat) discomfort Pain Descriptors / Indicators: Discomfort Pain Intervention(s): Limited activity within patient's  tolerance;Monitored during session;Repositioned  BP and O2 sats stable and WFL throughout treatment session.    Home Living Family/patient expects to be discharged to:: Private residence Living Arrangements: Spouse/significant other;Children (63 y.o., 13 y.o., and 72 y.o. children (2 sons and 1 daughter)) Available Help at Discharge: Family Type of Home: House Home Access: Level entry     Home Layout: One level [place]: None      Prior Function Level of Independence: Independent         Comments: No falls in past 6 months.     Hand  Dominance        Extremity/Trunk Assessment   Upper Extremity Assessment Upper Extremity Assessment: Defer to OT evaluation (Good B hand grip strength; at least 3/5 AROM B elbow flexion/extension; AAROM to at least 90 degrees B shoulder flexion)    Lower Extremity Assessment Lower Extremity Assessment: RLE deficits/detail;LLE deficits/detail RLE Deficits / Details: hip flexion and knee flexion/extension 4/5; DF 4+/5; at least 3/5 PF LLE Deficits / Details: hip flexion and knee flexion/extension 4+/5; DF 4+/5; at least 3/5 PF    Cervical / Trunk Assessment Cervical / Trunk Assessment: Normal  Communication   Communication: Tracheostomy  Cognition Arousal/Alertness: Awake/alert Behavior During Therapy: WFL for tasks assessed/performed Overall Cognitive Status: Within Functional Limits for tasks assessed                                        General Comments   Nursing cleared pt for participation in physical therapy.  Pt agreeable to PT session.    Exercises  Sitting balance   Assessment/Plan    PT Assessment Patient needs continued PT services  PT Problem List Decreased strength;Decreased activity tolerance;Decreased balance;Decreased mobility;Decreased knowledge of use of DME;Decreased knowledge of precautions;Cardiopulmonary status limiting activity;Pain;Decreased skin integrity       PT Treatment Interventions DME instruction;Gait training;Stair training;Functional mobility training;Therapeutic activities;Therapeutic exercise;Balance training;Patient/family education;Neuromuscular re-education    PT Goals (Current goals can be found in the Care Plan section)  Acute Rehab PT Goals Patient Stated Goal: to improve mobility PT Goal Formulation: With patient Time For Goal Achievement: 09/21/20 Potential to Achieve Goals: Good    Frequency 7X/week   Barriers to discharge        Co-evaluation               AM-PAC PT "6 Clicks" Mobility   Outcome Measure Help needed turning from your back to your side while in a flat bed without using bedrails?: A Little Help needed moving from lying on your back to sitting on the side of a flat bed without using bedrails?: A Lot Help needed moving to and from a bed to a chair (including a wheelchair)?: Total Help needed standing up from a chair using your arms (e.g., wheelchair or bedside chair)?: Total Help needed to walk in hospital room?: Total Help needed climbing 3-5 steps with a railing? : Total 6 Click Score: 9    End of Session Equipment Utilized During Treatment: Oxygen (Trach (mech vent)) Activity Tolerance: Patient tolerated treatment well Patient left: in chair (nursing present preparing to hoyer pt back to bed) Nurse Communication: Mobility status;Precautions PT Visit Diagnosis: Other abnormalities of gait and mobility (R26.89);Muscle weakness (generalized) (M62.81);Difficulty in walking, not elsewhere classified (R26.2)    Time: 7062-3762 PT Time Calculation (min) (ACUTE ONLY): 30 min   Charges:   PT Evaluation $PT Eval Low  Complexity: 1 Low PT Treatments $Therapeutic Activity: 8-22 mins       Leitha Bleak, PT 09/07/20, 12:35 PM

## 2020-09-07 NOTE — Progress Notes (Signed)
Inpatient Rehab Admissions Coordinator Note:   Per therapy recommendations, pt was screened for CIR candidacy by Clemens Catholic, [place] CCC-SLP. At this time, Pt. Is tolerating only bed level activities with therapies. Pt. Does not appear able to tolerate CIR level therapies at this time, but I will follow to monitor for progress and place consult if pt. Appears to be appropriate. Please contact me with questions.   Clemens Catholic, [place], [place] Admissions Coordinator  [phone] ([place]) [phone] (office)

## 2020-09-07 NOTE — Progress Notes (Signed)
NAME:  Kathy Vega, MRN:  [id number], DOB:  1976/05/28, LOS: 31 ADMISSION DATE:  08/13/2020, CONSULTATION DATE:  08/13/2020  Brief History   44 yo unvaccinated female admitted with COVID-19 pneumonia & ARDS requiring intubation and mechanical ventilation.  Past Medical History  Thyroid disease Anemia  Significant Hospital Events   09/16: Pt admitted to ICU requiring Heated HFNC and NRB, however remained in the ER pending ICU bed availability  09/16: CT Head revealed no acute intracranial abnormality 09/17: Pt with worsening hypoxia requiring mechanical intubation 09/18: [facility] refused to accept pt for ECMO 09/21: Attempted to transfer pt to [facility] for possible assessment for ECMO, however both hospitals DO NOT have bed availability and are at capacity and pt may or may NOT be an ECMO candidate at this time  09/23: Pt unproned  09/24:  Resumed proning protocol 09/25: Resumed proning protocol  09/26: Pt unproned 9/27-  No onvernight events, patient remains in ARDS on elevated settings. 08/25/20- patient is on maximal settings with sedation. She is on day 11 of her illness with little improvement.  Ive attempted to reach husband via cell no answer, ive successfully found her daughter and spoke with her regarding care plan and she is agreeable but requested for me to speak with Kathy Vega (husband ) and has provided me with secondary phone number [phone] but again only voice mail was reachable.  Have left voicemail and will discuss trache with family when they are available.  08/26/20- patient without overnight events, ive called POA husband again today but was unable to reach him and left voice mail.  08/27/20- patient critically ill with overall poor prognosis, she had desaturation event today and was difficult to improve requiring multiple changes on ventilator by RT.  08/28/20- Patient is weaned to 60% FiO2 on MV.  Have called Garlan Fillers today and updated him. We  discussed trache and he is agreeable.  08/29/20- Spoke with husband today, answered questions ,updated on care plan,  he is ready to speak with ENT regarding trache palcement 08/30/20- patient had SBT today she was awake and followed verbal communication but after 2 hours had severly labored breathing and thick high volume secretions.  I have met with husband Garlan Fillers and he was able to see patient at bedside we discussed trache. I have updated Dr Tami Ribas regarding trache plan.  08/31/20 - no airleak will need trach, discussed with Dr.Vaught. 09/01/20-Still no air leak. Patient has trach scheduled for Friday of this week and thus no need for steroids at this time 09/02/20- no air leak, continuing plan of care, no other concerns 09/03/20- very minimal air leak, continue with trach 10/8 09/04/20- Trach placed  Consults:  Surgery  Procedures:  ETT 9/17 >> 10/8 PICC 9/17 >> 10/11 Tracheostomy 10/8 >>  Significant Diagnostic Tests:    Micro Data:  MRSA PCR 9/16 >> negative BC 9/16 >> negative  Antimicrobials:  Metronidazole 9/24 >> 9/29  Interim history/subjective:  Patient, lying in bed- alert mouthing that she wants juice. Explained that we need to wean her oxygen more today.  Care RN this AM requested dubhoff to begin TF post trach, PIV placed to d/c PICC, PT/OT to begin working with patient 10/11  Labs/Imaging personally reviewed TMAX/ WBC: 38.1/ 13.7  PCT: 0.46 Net - 550 mL  BUN/ Cr.: 26/ 0.94 Hgb: 9.3  Objective   Blood pressure 111/78, pulse (!) 101, temperature 99.9 F (37.7 C), temperature source Oral, resp. rate (!) 24, height  5' 9.13" (1.756 m), weight 113.2 kg, SpO2 92 %.    Vent Mode: SIMV;PRVC FiO2 (%):  [35 %-40 %] 35 % Set Rate:  [12 bmp] 12 bmp Vt Set:  [420 mL] 420 mL PEEP:  [5 cmH20] 5 cmH20 Pressure Support:  [10 cmH20] 10 cmH20 Plateau Pressure:  [17 cmH20-18 cmH20] 17 cmH20   Intake/Output Summary (Last 24 hours) at 09/07/2020 1504 Last data filed  at 09/07/2020 0600 Gross per 24 hour  Intake 749.2 ml  Output 1350 ml  Net -600.8 ml   Filed Weights   09/03/20 0418 09/04/20 0441 09/05/20 0500  Weight: 122.2 kg 122.2 kg 113.2 kg    Examination: General: Adult female, lying in bed, NAD trach to vent HEENT: MM pink/moist, anicteric Neuro: Alert, nods/mouths appropriately, follows commands, PERRL CV: s1s2, RRR, NSR on monitor, pulses +2 R/P, no m/r/g Pulm: regular, non labored on SIMV (trach to vent), breath sounds rhonchi- BUL/ diminished- BLL GI: soft, rounded, bs x 4 Skin: no rashes/lesions noted, 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 Off airborne precautions. Tracheostomy placed 10/8  - Ventilator settings: SIMV 35%, 7 ml/kg - daily SBT, wean FiO2 & PEEP as tolerated - Ventilator settings established: SIMV 7 mL/kg - RASS goal: 0, precedex discontinued - continue morphine Q 4 for analgesia PRN - continue vitamin C & zinc - PT/OT consulted, mobilize as able  Leukocytosis WBC 15.2 - 10/10, low grade fevers, PCT 0.46.   - trend PCT next 2 days - pCXR ordered - monitor WBC/ fever curve - daily CBC - PICC & foley removed  Troponin elevation due to demand ischemia Hypertrophic cardiomyopathy Echo: LVEF 45-50% with severe concentric LVH, diastolic dysfunction  - no pressor requirement  - continuous cardiac monitoring - continue lasix BID as tolerated  Mild acute renal failure -resolved Hypokalemia -resolved Hypernatremia -resolved  - daily BMP, replace electrolytes PRN - strict I/O - avoid nephrotoxic agents, ensure adequate renal perfusion  Anemia without obvious acute blood loss Last transfusion  9/23, Hgb stable: 9.3  - daily CBC - monitor for s/s of bleeding - transfuse for Hgb < 7  Hyperglycemia   - Q 4 CBG, SSI - restarted TF 10/11, dub hoff placed   Best practice:   Diet: NPO with TF Pain/Anxiety/Delirium protocol (if indicated): morphine Q 4 PRN VAP protocol (if indicated): established DVT prophylaxis: Heparin SQ & SCDs GI prophylaxis: Pepcid Glucose control: Q 4 with SSI, target range 140-180 Mobility: Bedrest Code Status: FULL Family Communication: Patient updated bedside, will update family PRN- 10/11 Disposition: ICU  Labs   CBC: Recent Labs  Lab 09/03/20 0426 09/04/20 0448 09/05/20 0500 09/06/20 0425 09/07/20 0526  WBC 10.0 8.7 10.8* 15.2* 13.7*  NEUTROABS 8.0* 6.7 8.9* 13.1* 11.6*  HGB 9.9* 10.1* 10.2* 10.0* 9.3*  HCT 32.4* 33.2* 32.8* 32.5* 30.7*  MCV 79.2* 80.0 79.4* 79.9* 81.2  PLT 322 288 302 280 136    Basic Metabolic Panel: Recent Labs  Lab 09/03/20 0426 09/04/20 0448 09/05/20 0500 09/06/20 0425 09/07/20 0526  NA 141 141 137 138 141  K 3.9 4.0 4.1 3.8 3.7  CL 97* 100 98 98 102  CO2 32 _0 GLUCOSE 143* 127* 109* 125*  115*  BUN 23* 24* 31* 31* 26*  CREATININE 0.91 0.90 1.03* 1.06* 0.94  CALCIUM 9.6 9.0 9.5 9.3 9.3  MG 2.4 2.2 2.5* 2.4 2.5*  PHOS 5.4* 5.2* 5.9* 4.7* 3.9   GFR: Estimated Creatinine Clearance: 102.7 mL/min (by C-G formula based on SCr of 0.94 mg/dL). Recent Labs  Lab 09/04/20 0448 09/05/20 0500 09/06/20 0425 09/07/20 0526  WBC 8.7 10.8* 15.2* 13.7*    Liver Function Tests: No results for input(s): AST, ALT, ALKPHOS, BILITOT, PROT, ALBUMIN in the last 168 hours. No results for input(s): LIPASE, AMYLASE in the last 168 hours. No results for input(s): AMMONIA in the last 168 hours.  ABG    Component Value Date/Time   PHART 7.47 (H) 08/31/2020 0800   PCO2ART 53 (H) 08/31/2020 0800   PO2ART 61 (L) 08/31/2020 0800   HCO3 38.6 (H) 08/31/2020 0800   O2SAT 92.5 08/31/2020 0800     Coagulation  Profile: No results for input(s): INR, PROTIME in the last 168 hours.  Cardiac Enzymes: No results for input(s): CKTOTAL, CKMB, CKMBINDEX, TROPONINI in the last 168 hours.  HbA1C: No results found for: HGBA1C  CBG: Recent Labs  Lab 09/06/20 1142 09/06/20 1547 09/06/20 1926 09/07/20 0014 09/07/20 0427  GLUCAP 102* 109* 119* 121* 128*     Critical care time: 35 minutes     Domingo Pulse Rust-[place], AGACNP-BC [facility]    Please see Amion for pager details.

## 2020-09-07 NOTE — Progress Notes (Signed)
Patient complained of nausea.  Tube feeds decreased to 8ml/hr and zofran given.  Tube feeds to be advanced slowly per Olga Millers, NP.

## 2020-09-07 NOTE — Progress Notes (Signed)
Patient placed back to bed with lift by nurse, Jinny Blossom, CNA, and [facility].  Patient tolerated being out of bed without complication.  VSS.  Patient denies pain.

## 2020-09-07 NOTE — Progress Notes (Signed)
Patient continues to complain of nausea.  Issue discussed with Dr. Mortimer Fries and Toribio Harbour rust-chester. Tube feeds to be stopped at this time.

## 2020-09-08 LAB — CBC WITH DIFFERENTIAL/PLATELET
Abs Immature Granulocytes: 0.1 10*3/uL — ABNORMAL HIGH (ref 0.00–0.07)
Basophils Absolute: 0 10*3/uL (ref 0.0–0.1)
Basophils Relative: 0 %
Eosinophils Absolute: 0.2 10*3/uL (ref 0.0–0.5)
Eosinophils Relative: 2 %
HCT: 31.4 % — ABNORMAL LOW (ref 36.0–46.0)
Hemoglobin: 9.5 g/dL — ABNORMAL LOW (ref 12.0–15.0)
Immature Granulocytes: 1 %
Lymphocytes Relative: 8 %
Lymphs Abs: 0.8 10*3/uL (ref 0.7–4.0)
MCH: 24.7 pg — ABNORMAL LOW (ref 26.0–34.0)
MCHC: 30.3 g/dL (ref 30.0–36.0)
MCV: 81.6 fL (ref 80.0–100.0)
Monocytes Absolute: 0.9 10*3/uL (ref 0.1–1.0)
Monocytes Relative: 8 %
Neutro Abs: 8.3 10*3/uL — ABNORMAL HIGH (ref 1.7–7.7)
Neutrophils Relative %: 81 %
Platelets: 269 10*3/uL (ref 150–400)
RBC: 3.85 MIL/uL — ABNORMAL LOW (ref 3.87–5.11)
RDW: 25.5 % — ABNORMAL HIGH (ref 11.5–15.5)
WBC: 10.3 10*3/uL (ref 4.0–10.5)
nRBC: 0.2 % (ref 0.0–0.2)

## 2020-09-08 LAB — GLUCOSE, CAPILLARY
Glucose-Capillary: 112 mg/dL — ABNORMAL HIGH (ref 70–99)
Glucose-Capillary: 105 mg/dL — ABNORMAL HIGH (ref 70–99)
Glucose-Capillary: 112 mg/dL — ABNORMAL HIGH (ref 70–99)
Glucose-Capillary: 106 mg/dL — ABNORMAL HIGH (ref 70–99)
Glucose-Capillary: 123 mg/dL — ABNORMAL HIGH (ref 70–99)

## 2020-09-08 LAB — BASIC METABOLIC PANEL
Anion gap: 14 (ref 5–15)
BUN: 26 mg/dL — ABNORMAL HIGH (ref 6–20)
CO2: 26 mmol/L (ref 22–32)
Calcium: 9.5 mg/dL (ref 8.9–10.3)
Chloride: 103 mmol/L (ref 98–111)
Creatinine, Ser: 0.94 mg/dL (ref 0.44–1.00)
GFR, Estimated: >60 mL/min (ref >60)
Glucose, Bld: 121 mg/dL — ABNORMAL HIGH (ref 70–99)
Potassium: 3.1 mmol/L — ABNORMAL LOW (ref 3.5–5.1)
Sodium: 143 mmol/L (ref 135–145)

## 2020-09-08 LAB — PHOSPHORUS: Phosphorus: 4.1 mg/dL (ref 2.5–4.6)

## 2020-09-08 LAB — PROCALCITONIN: Procalcitonin: 0.42 ng/mL

## 2020-09-08 LAB — MAGNESIUM: Magnesium: 2.4 mg/dL (ref 1.7–2.4)

## 2020-09-08 MED ORDER — POTASSIUM CHLORIDE 20 MEQ/15ML (10%) PO SOLN
40.0000 meq | Freq: Once | ORAL | Status: AC
  Administered 2020-09-08: 40 meq
  Filled 2020-09-08 (×4): qty 30

## 2020-09-08 NOTE — Progress Notes (Signed)
NAME:  Kathy Vega, MRN:  [id number], DOB:  1976/11/17, LOS: 45 ADMISSION DATE:  08/13/2020, CONSULTATION DATE:  08/13/2020  Brief History   44 yo unvaccinated female admitted with COVID-19 pneumonia & ARDS requiring intubation and mechanical ventilation.  Past Medical History  Thyroid disease Anemia  Significant Hospital Events   09/16: Pt admitted to ICU requiring Heated HFNC and NRB, however remained in the ER pending ICU bed availability  09/16: CT Head revealed no acute intracranial abnormality 09/17: Pt with worsening hypoxia requiring mechanical intubation 09/18: [facility] refused to accept pt for ECMO 09/21: Attempted to transfer pt to [facility] for possible assessment for ECMO, however both hospitals DO NOT have bed availability and are at capacity and pt may or may NOT be an ECMO candidate at this time  09/23: Pt unproned  09/24:  Resumed proning protocol 09/25: Resumed proning protocol  09/26: Pt unproned 9/27-  No onvernight events, patient remains in ARDS on elevated settings. 08/25/20- patient is on maximal settings with sedation. She is on day 11 of her illness with little improvement.  Ive attempted to reach husband via cell no answer, ive successfully found her daughter and spoke with her regarding care plan and she is agreeable but requested for me to speak with [name] and has provided me with secondary phone number [phone] but again only voice mail was reachable.  Have left voicemail and will discuss trache with family when they are available.  08/26/20- patient without overnight events, ive called POA husband again today but was unable to reach him and left voice mail.  08/27/20- patient critically ill with overall poor prognosis, she had desaturation event today and was difficult to improve requiring multiple changes on ventilator by RT.  08/28/20- Patient is weaned to 60% FiO2 on MV.  Have called Garlan Fillers today and updated him. We  discussed trache and he is agreeable.  08/29/20- Spoke with husband today, answered questions ,updated on care plan,  he is ready to speak with ENT regarding trache palcement 08/30/20- patient had SBT today she was awake and followed verbal communication but after 2 hours had severly labored breathing and thick high volume secretions.  I have met with husband Garlan Fillers and he was able to see patient at bedside we discussed trache. I have updated Dr Tami Ribas regarding trache plan.  08/31/20 - no airleak will need trach, discussed with Dr.Vaught. 09/01/20-Still no air leak. Patient has trach scheduled for Friday of this week and thus no need for steroids at this time 09/02/20- no air leak, continuing plan of care, no other concerns 09/03/20- very minimal air leak, continue with trach 10/8 09/04/20- Trach placed 09/07/20- Patient OOB with lift, weaned to 5/5, PICC & foley removed Consults:  Surgery  Procedures:  ETT 9/17 >> 10/8 PICC 9/17 >> 10/11 Tracheostomy 10/8 >>  Significant Diagnostic Tests:    Micro Data:  MRSA PCR 9/16 >> negative BC 9/16 >> negative Tracheal aspirate 10/11 >>  Antimicrobials:  Metronidazole 9/24 >> 9/29 Cefepime 10/11 >>  Interim history/subjective:  Patient transferred OOB to chair without lift, pivoted on her own strength with 2 person assist.  Patient denies pain, nausea, gas.  Labs/Imaging personally reviewed Net - 1 L  K+: 3.1- replacement scheduled BUN/ Cr.: 26/ 0.94 Hgb: 9.5 TMAX/ WBC: 37.5/10.3  Objective   Blood pressure 115/85, pulse (!) 104, temperature 99.8 F (37.7 C), temperature source Oral, resp. rate (!) 23, height 5' 9.13" (1.756 m),  weight 109.4 kg, SpO2 94 %.    Vent Mode: SIMV;PRVC FiO2 (%):  [35 %] 35 % Set Rate:  [12 bmp] 12 bmp Vt Set:  [420 mL] 420 mL PEEP:  [5 cmH20] 5 cmH20 Pressure Support:  [10 cmH20] 10 cmH20 Plateau Pressure:  [18 cmH20-20 cmH20] 20 cmH20   Intake/Output Summary (Last 24 hours) at 09/08/2020  0850 Last data filed at 09/08/2020 0430 Gross per 24 hour  Intake 242.43 ml  Output 850 ml  Net -607.57 ml   Filed Weights   09/04/20 0441 09/05/20 0500 09/08/20 0447  Weight: 122.2 kg 113.2 kg 109.4 kg    Examination: General: Adult female, sitting up in chair, NAD with tracheostomy requiring mechanical ventilation HEENT: MM pink/moist, anicteric Neuro: Alert & oriented, nodding/mouthing words appropriately, able to follow commands, PERRL CV: s1s2, ST on monitor, no m/r/g, pulses +2 R/P Pulm: regular, non labored on PSV 5/5 at 35% GI: soft, rounded, bs x4 GU: purewick in place, clear yellow urine Skin: known DTI on buttocks, no rashes/lesions noted Extremities: warm/dry, +1 edema in hands  Resolved Hospital Problem list   - Off of airborne/ contact precautions - Elevated Liver Enzymes  Assessment & Plan:   Acute hypoxic respiratory failure secondary to COVID-19 pneumonia and ARDS Morbid Obesity, possible OSA  S/p course of remdesevir, azithromycin & ceftriaxone. Off airborne precautions. Tracheostomy placed 10/8. Cefepime started 10/11 for suspected HCAP  - Ventilator settings: SIMV 35 %, 7 mL/kg with daily PSV trials 5/5 at 35% - daily SBT - RASS goal 0 - continue morphine Q 4 PRN for analgesia - continue vitamin C & zinc - PT/OT consulted, OOB to chair daily as tolerated - intermittent CXR/ ABG PRN  Leukocytosis- due to possible LLL pneumonia WBC 15.2 - 10/10, low grade fevers, PCT 0.46~ 0.42 CXR 10/11 shows worsening aeration in LLL. PICC & foley removed- 10/11  - trend PCT - monitor WBC/ fever curve - daily CBC  Troponin elevation due to demand ischemia Hypertrophic cardiomyopathy Echo: LVEF 45-50% with severe concentric LVH, diastolic dysfunction  - no pressor requirement - continuous cardiac monitoring - continue lasix BID as tolerated  Mild acute renal failure -resolved Hypokalemia -resolved Hypernatremia -resolved  - daily BMP, replace  electrolytes PRN - strict I/O - avoid nephrotoxic agents, ensure adequate renal perfusion  Anemia without obvious acute blood loss Last transfusion 9/23, Hgb stable: 9.3  - daily CBC - monitor for s/s of bleeding - transfuse for Hgb < 7  Hyperglycemia   - Q 4 CBG, SSI - Dub hoff in place, will attempt to advance and restart TF 10/12    Best practice:   Diet: NPO with TF Pain/Anxiety/Delirium protocol (if indicated): morphine Q 4 PRN VAP protocol (if indicated): established DVT prophylaxis: Heparin SQ & SCDs GI prophylaxis: Pepcid Glucose control: Q 4 with SSI, target range 140-180 Mobility: Bedrest Code Status: FULL Family Communication: patient updated bedside- 10/12, will update family PRN Disposition: ICU  Labs   CBC: Recent Labs  Lab 09/04/20 0448 09/05/20 0500 09/06/20 0425 09/07/20 0526 09/08/20 0429  WBC 8.7 10.8* 15.2* 13.7* 10.3  NEUTROABS 6.7 8.9* 13.1* 11.6* 8.3*  HGB 10.1* 10.2* 10.0* 9.3* 9.5*  HCT 33.2* 32.8* 32.5* 30.7* 31.4*  MCV 80.0 79.4* 79.9* 81.2 81.6  PLT 288 302 280 248 384    Basic Metabolic Panel: Recent Labs  Lab 09/04/20 0448 09/05/20 0500 09/06/20 0425 09/07/20 0526 09/08/20 0429  NA 141 137 138 141 143  K 4.0 4.1 3.8 3.7  3.1*  CL 100 98 98 102 103  CO2 30 30 29 29 26   GLUCOSE 127* 109* 125* 115* 121*  BUN 24* 31* 31* 26* 26*  CREATININE 0.90 1.03* 1.06* 0.94 0.94  CALCIUM 9.0 9.5 9.3 9.3 9.5  MG 2.2 2.5* 2.4 2.5* 2.4  PHOS 5.2* 5.9* 4.7* 3.9 4.1   GFR: Estimated Creatinine Clearance: 100.9 mL/min (by C-G formula based on SCr of 0.94 mg/dL). Recent Labs  Lab 09/05/20 0500 09/06/20 0425 09/07/20 0526 09/08/20 0429  PROCALCITON  --   --  0.46 0.42  WBC 10.8* 15.2* 13.7* 10.3    Liver Function Tests: No results for input(s): AST, ALT, ALKPHOS, BILITOT, PROT, ALBUMIN in the last 168 hours. No results for input(s): LIPASE, AMYLASE in the last 168 hours. No results for input(s): AMMONIA in the last 168  hours.  ABG    Component Value Date/Time   PHART 7.47 (H) 08/31/2020 0800   PCO2ART 53 (H) 08/31/2020 0800   PO2ART 61 (L) 08/31/2020 0800   HCO3 38.6 (H) 08/31/2020 0800   O2SAT 92.5 08/31/2020 0800     Coagulation Profile: No results for input(s): INR, PROTIME in the last 168 hours.  Cardiac Enzymes: No results for input(s): CKTOTAL, CKMB, CKMBINDEX, TROPONINI in the last 168 hours.  HbA1C: No results found for: HGBA1C  CBG: Recent Labs  Lab 09/07/20 1608 09/07/20 1955 09/07/20 2332 09/08/20 0345 09/08/20 0721  GLUCAP 125* 103* 115* 112* 105*     Critical care time: 35 minutes     Domingo Pulse [place], AGACNP-BC [facility]    Please see Amion for pager details.

## 2020-09-08 NOTE — Progress Notes (Signed)
Physical Therapy Treatment Patient Details Name: Kathy Vega MRN: [id number] DOB: 1976-10-16 Today's Date: 09/08/2020    History of Present Illness Pt is a 44 y.o. female presenting to hospital 9/16 with SOB and confusion; initial O2 sats in 20's; pt COVID (+) 6 days prior.  Mechanically intubated 9/17.  S/p trach 10/8.  Pt admitted with acute hypoxic respiratory failure secondary COVID-19 PNA and ARDS, possible OSA with upper airway swelling s/p trach, and severe acute hypoxic and hypercapnic respiratory failure.  PMH includes anemia, thyroid disease, h/o leg surgery.    PT Comments    Pt resting in recliner upon PT arrival (pt able to get from bed to recliner with 2 assist earlier today).  Able to stand up to RW (with 2 attempts and cueing for positioning/technique) with min to mod assist x2.  Pt's HR increased from 116 bpm at rest to 150-151 bpm in standing; pt's HR staying up at 149 bpm in standing so pt assisted back to sitting in recliner.  Pt's HR gradually decreased to 127-134 bpm at rest but d/t HR staying elevated deferred further activity (nurse notified and pt's HR eventually decreased to 120 bpm after session was completed).  Will continue to focus on strengthening and progressive functional mobility and progress towards tolerance to inpatient rehab.    Follow Up Recommendations  CIR     Equipment Recommendations  Rolling walker with 5" wheels    Recommendations for Other Services OT consult     Precautions / Restrictions Precautions Precautions: Fall Precaution Comments: Trach (mech vent); HOB >30 degrees; Dobhoff Restrictions Weight Bearing Restrictions: No    Mobility  Bed Mobility               General bed mobility comments: Deferred (pt in recliner upon PT arrival)  Transfers Overall transfer level: Needs assistance Equipment used: Rolling walker (2 wheeled) Transfers: Sit to/from Stand Sit to Stand: Min assist;Mod assist;+2 physical assistance          General transfer comment: 2 attempts to stand up to RW with 2 assist; vc's for UE placement and scooting to edge of sitting surface prior to standing; assist to initiate and come to full stand  Ambulation/Gait             General Gait Details: Deferred d/t elevated HR with standing   Stairs             Wheelchair Mobility    Modified Rankin (Stroke Patients Only)       Balance Overall balance assessment: Needs assistance Sitting-balance support: No upper extremity supported;Feet supported Sitting balance-Leahy Scale: Good Sitting balance - Comments: steady sitting reaching within BOS   Standing balance support: Bilateral upper extremity supported Standing balance-Leahy Scale: Fair Standing balance comment: steady static standing with B UE support on RW                            Cognition Arousal/Alertness: Awake/alert Behavior During Therapy: WFL for tasks assessed/performed Overall Cognitive Status: Within Functional Limits for tasks assessed                                        Exercises      General Comments   Nursing cleared pt for participation in physical therapy.  Pt agreeable to PT session.      Pertinent Vitals/Pain Pain Assessment:  No/denies pain Pain Intervention(s): Limited activity within patient's tolerance;Monitored during session;Repositioned  O2 sats 95% or greater via trach mechanical ventilation during session.    Home Living                      Prior Function            PT Goals (current goals can now be found in the care plan section) Acute Rehab PT Goals Patient Stated Goal: to get better PT Goal Formulation: With patient Time For Goal Achievement: 09/21/20 Potential to Achieve Goals: Good Progress towards PT goals: Progressing toward goals    Frequency    7X/week      PT Plan Current plan remains appropriate    Co-evaluation              AM-PAC PT "6 Clicks"  Mobility   Outcome Measure  Help needed turning from your back to your side while in a flat bed without using bedrails?: A Little Help needed moving from lying on your back to sitting on the side of a flat bed without using bedrails?: A Lot Help needed moving to and from a bed to a chair (including a wheelchair)?: Total Help needed standing up from a chair using your arms (e.g., wheelchair or bedside chair)?: Total Help needed to walk in hospital room?: Total Help needed climbing 3-5 steps with a railing? : Total 6 Click Score: 9    End of Session Equipment Utilized During Treatment: Oxygen (trach (mech vent)) Activity Tolerance: Other (comment) (Limited d/t elevated HR with activity) Patient left: in chair;with call bell/phone within reach Nurse Communication: Mobility status;Precautions;Other (comment) (pt's HR with activity) PT Visit Diagnosis: Other abnormalities of gait and mobility (R26.89);Muscle weakness (generalized) (M62.81);Difficulty in walking, not elsewhere classified (R26.2)     Time: 8527-7824 PT Time Calculation (min) (ACUTE ONLY): 25 min  Charges:  $Therapeutic Activity: 23-37 mins                    Leitha Bleak, PT 09/08/20, 12:23 PM

## 2020-09-08 NOTE — Progress Notes (Signed)
Great day. Sat up in chair for 3 hours. Patient stood with help and walked to the  chair and walked back to bed using a walker. Tube feedings were restarted at 2 pm at rate of 20 ml / hour with no complaints of nausea. Upset with significant other because she could not call or face time him today. Worked with PT.

## 2020-09-09 ENCOUNTER — Inpatient Hospital Stay: Payer: Medicaid Other

## 2020-09-09 LAB — BASIC METABOLIC PANEL
Anion gap: 11 (ref 5–15)
BUN: 27 mg/dL — ABNORMAL HIGH (ref 6–20)
CO2: 28 mmol/L (ref 22–32)
Calcium: 9.4 mg/dL (ref 8.9–10.3)
Chloride: 104 mmol/L (ref 98–111)
Creatinine, Ser: 0.83 mg/dL (ref 0.44–1.00)
GFR, Estimated: >60 mL/min (ref >60)
Glucose, Bld: 138 mg/dL — ABNORMAL HIGH (ref 70–99)
Potassium: 3 mmol/L — ABNORMAL LOW (ref 3.5–5.1)
Sodium: 143 mmol/L (ref 135–145)

## 2020-09-09 LAB — GLUCOSE, CAPILLARY
Glucose-Capillary: 103 mg/dL — ABNORMAL HIGH (ref 70–99)
Glucose-Capillary: 116 mg/dL — ABNORMAL HIGH (ref 70–99)
Glucose-Capillary: 117 mg/dL — ABNORMAL HIGH (ref 70–99)
Glucose-Capillary: 118 mg/dL — ABNORMAL HIGH (ref 70–99)
Glucose-Capillary: 122 mg/dL — ABNORMAL HIGH (ref 70–99)
Glucose-Capillary: 141 mg/dL — ABNORMAL HIGH (ref 70–99)
Glucose-Capillary: 144 mg/dL — ABNORMAL HIGH (ref 70–99)

## 2020-09-09 LAB — CBC WITH DIFFERENTIAL/PLATELET
Abs Immature Granulocytes: 0.07 10*3/uL (ref 0.00–0.07)
Basophils Absolute: 0 10*3/uL (ref 0.0–0.1)
Basophils Relative: 1 %
Eosinophils Absolute: 0.2 10*3/uL (ref 0.0–0.5)
Eosinophils Relative: 3 %
HCT: 32.4 % — ABNORMAL LOW (ref 36.0–46.0)
Hemoglobin: 9.6 g/dL — ABNORMAL LOW (ref 12.0–15.0)
Immature Granulocytes: 1 %
Lymphocytes Relative: 9 %
Lymphs Abs: 0.8 10*3/uL (ref 0.7–4.0)
MCH: 24.6 pg — ABNORMAL LOW (ref 26.0–34.0)
MCHC: 29.6 g/dL — ABNORMAL LOW (ref 30.0–36.0)
MCV: 82.9 fL (ref 80.0–100.0)
Monocytes Absolute: 0.7 10*3/uL (ref 0.1–1.0)
Monocytes Relative: 8 %
Neutro Abs: 6.4 10*3/uL (ref 1.7–7.7)
Neutrophils Relative %: 78 %
Platelets: 303 10*3/uL (ref 150–400)
RBC: 3.91 MIL/uL (ref 3.87–5.11)
RDW: 25.5 % — ABNORMAL HIGH (ref 11.5–15.5)
Smear Review: NORMAL
WBC: 8.2 10*3/uL (ref 4.0–10.5)
nRBC: 0 % (ref 0.0–0.2)

## 2020-09-09 LAB — PHOSPHORUS: Phosphorus: 3.3 mg/dL (ref 2.5–4.6)

## 2020-09-09 LAB — PROCALCITONIN: Procalcitonin: 0.2 ng/mL

## 2020-09-09 LAB — CULTURE, RESPIRATORY W GRAM STAIN

## 2020-09-09 LAB — MAGNESIUM: Magnesium: 2.4 mg/dL (ref 1.7–2.4)

## 2020-09-09 MED ORDER — QUETIAPINE FUMARATE 25 MG PO TABS
25.0000 mg | ORAL_TABLET | Freq: Every evening | ORAL | Status: DC | PRN
  Administered 2020-09-09: 25 mg via ORAL
  Filled 2020-09-09: qty 1

## 2020-09-09 MED ORDER — POTASSIUM CHLORIDE 20 MEQ PO PACK
40.0000 meq | PACK | Freq: Once | ORAL | Status: AC
  Administered 2020-09-09: 40 meq
  Filled 2020-09-09: qty 2

## 2020-09-09 MED ORDER — POTASSIUM CHLORIDE 20 MEQ PO PACK
40.0000 meq | PACK | ORAL | Status: AC
  Administered 2020-09-09 (×2): 40 meq
  Filled 2020-09-09 (×2): qty 2

## 2020-09-09 NOTE — Progress Notes (Signed)
PHARMACY CONSULT NOTE - FOLLOW UP  Pharmacy Consult for Electrolyte Monitoring and Replacement   Recent Labs: Potassium (mmol/L)  Date Value  09/09/2020 3.0 (L)  03/03/2014 3.9   Magnesium (mg/dL)  Date Value  09/09/2020 2.4   Calcium (mg/dL)  Date Value  09/09/2020 9.4   Calcium, Total (mg/dL)  Date Value  03/03/2014 9.3   Albumin (g/dL)  Date Value  08/25/2020 2.3 (L)  03/03/2014 4.2   Phosphorus (mg/dL)  Date Value  09/09/2020 3.3   Sodium (mmol/L)  Date Value  09/09/2020 143  03/03/2014 140   Assessment: K+ 3.0, other lytes WNL  Goal of Therapy:  K+ >/= 4 while in ICU  Plan:  KCL 63meq per tube q4h x 2 doses and recheck K+ after 2nd dose  Ena Dawley ,PharmD Clinical Pharmacist 09/09/2020 6:50 AM

## 2020-09-09 NOTE — Progress Notes (Signed)
NAME:  Kathy Vega, MRN:  [id number], DOB:  [birth year], LOS: 109 ADMISSION DATE:  08/13/2020, CONSULTATION DATE:  08/13/2020  Brief History   44 yo unvaccinated female admitted with COVID-19 pneumonia & ARDS requiring intubation and mechanical ventilation.  Past Medical History  Thyroid disease Anemia  Significant Hospital Events   09/16: Pt admitted to ICU requiring Heated HFNC and NRB, however remained in the ER pending ICU bed availability  09/16: CT Head revealed no acute intracranial abnormality 09/17: Pt with worsening hypoxia requiring mechanical intubation 09/18: [facility] refused to accept pt for ECMO 09/21: Attempted to transfer pt to [facility] and [facility] for possible assessment for ECMO, however both hospitals DO NOT have bed availability and are at capacity and pt may or may NOT be an ECMO candidate at this time  09/23: Pt unproned  09/24:  Resumed proning protocol 09/25: Resumed proning protocol  09/26: Pt unproned 9/27-  No onvernight events, patient remains in ARDS on elevated settings. 08/25/20- patient is on maximal settings with sedation. She is on day 11 of her illness with little improvement.  Ive attempted to reach husband via cell no answer, ive successfully found her daughter and spoke with her regarding care plan and she is agreeable but requested for me to speak with [name] and has provided me with secondary phone number [phone] but again only voice mail was reachable.  Have left voicemail and will discuss trache with family when they are available.  08/26/20- patient without overnight events, ive called POA husband again today but was unable to reach him and left voice mail.  08/27/20- patient critically ill with overall poor prognosis, she had desaturation event today and was difficult to improve requiring multiple changes on ventilator by RT.  08/28/20- Patient is weaned to 60% FiO2 on MV.  Have called Garlan Fillers today and updated him. We  discussed trache and he is agreeable.  08/29/20- Spoke with husband today, answered questions ,updated on care plan,  he is ready to speak with ENT regarding trache palcement 08/30/20- patient had SBT today she was awake and followed verbal communication but after 2 hours had severly labored breathing and thick high volume secretions.  I have met with husband Garlan Fillers and he was able to see patient at bedside we discussed trache. I have updated Dr Tami Ribas regarding trache plan.  08/31/20 - no airleak will need trach, discussed with Dr.Vaught. 09/01/20-Still no air leak. Patient has trach scheduled for Friday of this week and thus no need for steroids at this time 09/02/20- no air leak, continuing plan of care, no other concerns 09/03/20- very minimal air leak, continue with trach 10/8 09/04/20- Trach placed 09/07/20- Patient OOB with lift, weaned to 5/5, PICC & foley removed 09/08/20- tolerated 1 hr TC, OOB to chair without lift, pivoted on her own strength Consults:  Surgery  Procedures:  ETT 9/17 >> 10/8 PICC 9/17 >> 10/11 Tracheostomy 10/8 >>  Significant Diagnostic Tests:    Micro Data:  MRSA PCR 9/16 >> negative [facility] 9/16 >> negative Tracheal aspirate 10/11 >> Pseudomonas aeruginosa  Antimicrobials:  Metronidazole 9/24 >> 9/29 Cefepime 10/11 >>  Interim history/subjective:  Patient alert, sitting in bed, writing that she didn't sleep well overnight.  She complained of bad dreams that kept her awake.  She denied taking anything at home to help her sleep.  We discussed giving her something tonight to help.  Labs/ Imaging personally reviewed Net: + 1.4 L (however, patient has  had large urine occurrences that were not captured by the pure wick) Na+/ K+: 143/ 3.0 (replaced) BUN/Cr.: 27/ 0.83 Hgb: 9.6 WBC/ TMAX: 8.2/ 37.2 PCT: 0.20  CXR 10/13: persistent diffuse patchy bilateral airspace disease, LLL hazy opacity/ pleural effusion can not be excluded. Objective   Blood  pressure (!) 124/92, pulse 100, temperature 98.7 F (37.1 C), temperature source Axillary, resp. rate (!) 23, height 5' 9.13" (1.756 m), weight 109.1 kg, SpO2 99 %.    Vent Mode: PSV FiO2 (%):  [35 %] 35 % Set Rate:  [12 bmp] 12 bmp Vt Set:  [420 mL] 420 mL PEEP:  [5 cmH20] 5 cmH20 Pressure Support:  [5 cmH20-10 cmH20] 5 cmH20   Intake/Output Summary (Last 24 hours) at 09/09/2020 1244 Last data filed at 09/09/2020 1242 Gross per 24 hour  Intake 1431 ml  Output 500 ml  Net 931 ml   Filed Weights   09/05/20 0500 09/08/20 0447 09/09/20 0500  Weight: 113.2 kg 109.4 kg 109.1 kg    Examination: General: Adult female, lying in bed with tracheostomy requiring mechanical ventilation, NAD HEENT: MM pink/moist, anicteric  Neuro: alert/oriented, following commands, able to write legible responses, PERRL +3 , MAE CV: s1s2 RRR, ST on monitor, no r/m/g Pulm: Regular, non labored on SIMV 35% , breath sounds rhonchorous-BUL & diminished-BLL GI: soft, rounded, non tender, bs x 4 GU: pure wick in place with clear yellow urine Skin: known DTI on buttocks, no rashes/lesions noted Extremities: warm/dry, pulses + 2 R/P, no edema noted  Resolved Hospital Problem list   - Off of airborne/ contact precautions - Elevated Liver Enzymes  Assessment & Plan:   Acute hypoxic respiratory failure secondary to COVID-19 pneumonia and ARDS Morbid Obesity, possible OSA  S/p course of remdesevir, azithromycin & ceftriaxone. Off airborne precautions. Tracheostomy placed 10/8. Cefepime started 10/11 for suspected HCAP  - Ventilator settings: SIMV 35%, 7 mL/kg with daily TC trials & PSV 5/5, plan for 2 hour TC trial 10/13 - RASS goal 0 - continue morphine Q 4 PRN for analgesia - seroquel 25 mg QHS started due to difficulty sleeping overnight - continue vitamin C & zinc - PT/OT following, mobilize as tolerated- OOB to chair daily as tolerated - Intermittent CXR, ABG PRN  Leukocytosis- due to possible LLL  pneumonia WBC 15.2 - 10/10, low grade fevers, PCT 0.46~ 0.42 CXR 10/11 shows worsening aeration in LLL. PICC & foley removed- 10/11  - Continue Cefepime - trend PCT - monitor WBC/ fever curve - daily CBC  Troponin elevation due to demand ischemia Hypertrophic cardiomyopathy Echo: LVEF 45-50% with severe concentric LVH, diastolic dysfunction  - no pressor requirement - continuous cardiac monitoring - continue lasix BID as tolerated  Mild acute renal failure -resolved Hypokalemia -resolved Hypernatremia -resolved  - Strict I/O's: alert provider if UOP < 0.5 mL/kg/hr - Daily BMP, replace electrolytes PRN - Avoid nephrotoxic agents as able, ensure adequate renal perfusion  Anemia without obvious acute blood loss Last transfusion 9/23, Hgb stable  - Monitor for s/s of bleeding - Daily CBC - Transfuse for Hgb <7  Hyperglycemia   - Q 4 CBG, SSI  Best practice:   Diet: NPO with TF Pain/Anxiety/Delirium protocol (if indicated): morphine Q 4 PRN, seroquel QHS VAP protocol (if indicated): established DVT prophylaxis: Heparin SQ & SCDs GI prophylaxis: Pepcid Glucose control: Q 4 with SSI, target range 140-180 Mobility: Bedrest Code Status: FULL Family Communication: patient updated bedside-10/13 will update family PRN Disposition: ICU  Labs   CBC:  Recent Labs  Lab 09/05/20 0500 09/06/20 0425 09/07/20 0526 09/08/20 0429 09/09/20 0347  WBC 10.8* 15.2* 13.7* 10.3 8.2  NEUTROABS 8.9* 13.1* 11.6* 8.3* 6.4  HGB 10.2* 10.0* 9.3* 9.5* 9.6*  HCT 32.8* 32.5* 30.7* 31.4* 32.4*  MCV 79.4* 79.9* 81.2 81.6 82.9  PLT 302 280 248 269 935    Basic Metabolic Panel: Recent Labs  Lab 09/05/20 0500 09/06/20 0425 09/07/20 0526 09/08/20 0429 09/09/20 0347  NA 137 138 141 143 143  K 4.1 3.8 3.7 3.1* 3.0*  CL 98 98 102 103 104  CO2 _0 GLUCOSE 109* 125* 115* 121* 138*  BUN 31* 31* 26* 26* 27*  CREATININE 1.03* 1.06* 0.94 0.94 0.83  CALCIUM 9.5 9.3 9.3 9.5 9.4   MG 2.5* 2.4 2.5* 2.4 2.4  PHOS 5.9* 4.7* 3.9 4.1 3.3   GFR: Estimated Creatinine Clearance: 114 mL/min (by C-G formula based on SCr of 0.83 mg/dL). Recent Labs  Lab 09/06/20 0425 09/07/20 0526 09/08/20 0429 09/09/20 0347  PROCALCITON  --  0.46 0.42 0.20  WBC 15.2* 13.7* 10.3 8.2    Liver Function Tests: No results for input(s): AST, ALT, ALKPHOS, BILITOT, PROT, ALBUMIN in the last 168 hours. No results for input(s): LIPASE, AMYLASE in the last 168 hours. No results for input(s): AMMONIA in the last 168 hours.  ABG    Component Value Date/Time   PHART 7.47 (H) 08/31/2020 0800   PCO2ART 53 (H) 08/31/2020 0800   PO2ART 61 (L) 08/31/2020 0800   HCO3 38.6 (H) 08/31/2020 0800   O2SAT 92.5 08/31/2020 0800     Coagulation Profile: No results for input(s): INR, PROTIME in the last 168 hours.  Cardiac Enzymes: No results for input(s): CKTOTAL, CKMB, CKMBINDEX, TROPONINI in the last 168 hours.  HbA1C: No results found for: HGBA1C  CBG: Recent Labs  Lab 09/08/20 2008 09/09/20 0029 09/09/20 0353 09/09/20 0727 09/09/20 1136  GLUCAP 123* 116* 141* 118* 117*     Critical care time: 35 minutes     Domingo Pulse Rust[place], AGACNP-BC [facility]    Please see Amion for pager details.

## 2020-09-09 NOTE — Progress Notes (Addendum)
SLP Cancellation Note  Patient Details Name: Kathy Vega MRN: [id number] DOB: [birth year]   Cancelled treatment:       Reason Eval/Treat Not Completed: Patient not medically ready;Medical issues which prohibited therapy (chart reviewed; discussed w/ NP/MD). Reviewed chart notes. Pt is s/p trach 10/8; NGT placed 10/11. She is in the beginning of her vent wean protocol on trach collar for ~2 hours at a time. She has been eating ice chips w/ trach cuff inflated on vent during the day for her pleasure prior to this note per NSG. NP/MD stated concern about lung status changes and possible pneumonia. Discussed pt's situation and concern for micro, silent aspiration when swallowing on vent w/ cuff inflated as well as w/ having and NG in place. Recommend holding off on ice chips IF increased concern for pneumonia until pt can move further through her wean to be able to be on trach collar w/ a PMV placed for safer swallowing.  ST services can be reconsulted for PMV assessment then BSE when pt has achieved ~4-6 hours on trach collar w/ stable Pulmonary status, O2 needs. Recommend frequent oral care for hygiene and stimulation of swallowing vs ice chips. NP/MD agreed.     Orinda Kenner, MS, CCC-SLP Speech Language Pathologist Rehab Services [phone] [facility] 09/09/2020, 10:35 AM

## 2020-09-09 NOTE — Progress Notes (Signed)
PHARMACY CONSULT NOTE - FOLLOW UP  Pharmacy Consult for Electrolyte Monitoring and Replacement   Recent Labs: Potassium (mmol/L)  Date Value  09/09/2020 3.0 (L)  03/03/2014 3.9   Magnesium (mg/dL)  Date Value  09/09/2020 2.4   Calcium (mg/dL)  Date Value  09/09/2020 9.4   Calcium, Total (mg/dL)  Date Value  03/03/2014 9.3   Albumin (g/dL)  Date Value  08/25/2020 2.3 (L)  03/03/2014 4.2   Phosphorus (mg/dL)  Date Value  09/09/2020 3.3   Sodium (mmol/L)  Date Value  09/09/2020 143  03/03/2014 140     Assessment: 44 year old female admitted with COVID, intubated and sedated in the ICU. Patient is now s/p trach placement. Remains on tube feeds at this time. Pharmacy consult for electrolyte replacement.  Lasix 20 mg IV q12h  Goal of Therapy:  Electrolytes WNL  Plan:  Expect hypokalemia s/t diuresis. Order for potassium 40 mEq per tube for 2 doses. Will add an additional 40 mEq per tube for one dose this evening with Lasix. Follow up electrolytes with morning labs.  Tawnya Crook ,PharmD Clinical Pharmacist 09/09/2020 11:32 AM

## 2020-09-09 NOTE — Progress Notes (Signed)
Occupational Therapy Treatment Patient Details Name: Kathy Vega MRN: [id number] DOB: 03-May-1976 Today's Date: 09/09/2020    History of present illness Pt is a 44 y.o. female presenting to hospital 9/16 with SOB and confusion; initial O2 sats in 20's; pt COVID (+) 6 days prior.  Mechanically intubated 9/17.  S/p trach 10/8.  Pt admitted with acute hypoxic respiratory failure secondary COVID-19 PNA and ARDS, possible OSA with upper airway swelling s/p trach, and severe acute hypoxic and hypercapnic respiratory failure.  PMH includes anemia, thyroid disease, h/o leg surgery.   OT comments  Pt in bed upon entering the room and agreeable to OT intervention. Pt with no c/o pain this session. Supine >sit with mod A to EOB. Pt standing with mod lifting assistance from EOB. BP sitting EOB was 125/95 and with HR of 97-101bp,m.  Pt ambulating ~10 feet with RW and min A of 2 to manage equipment. Pt taking two steps backwards towards chair and reaching back for controlled descent into recliner chair. Pt seated with BP of 119/95 and HR increased to 125 bpm. Pt remained seated in recliner chair and OT demonstrated use of level 1 resistive theraband for B UE strengthening. Pt performing 2 sets of 10 chest pulls, alternating puches, bicep curls, and lateral pull downs with min cuing for technique. Rest break between sets as needed. Pt remains in recliner chair with all needs within reach and a smile on her face. Pt continues to be excellent candidate for intensive inpatient rehab program to address functional deficits.    Follow Up Recommendations  CIR;Supervision/Assistance - 24 hour    Equipment Recommendations  Other (comment) (defer to next venue of care)       Precautions / Restrictions Precautions Precautions: Fall Precaution Comments: Trach (mech vent); HOB >30 degrees; Dobhoff       Mobility Bed Mobility Overal bed mobility: Needs Assistance Bed Mobility: Rolling;Supine to Sit;Sit to  Supine Rolling: Mod assist   Supine to sit: Mod assist;HOB elevated     General bed mobility comments: Mod A for trunk support and to assist L LE fully off bed  Transfers Overall transfer level: Needs assistance Equipment used: Rolling walker (2 wheeled) Transfers: Sit to/from Stand Sit to Stand: Mod assist         General transfer comment: mod A of 1 person to stand from bed level    Balance Overall balance assessment: Needs assistance Sitting-balance support: No upper extremity supported;Feet supported Sitting balance-Leahy Scale: Good Sitting balance - Comments: steady sitting reaching within BOS   Standing balance support: Bilateral upper extremity supported Standing balance-Leahy Scale: Fair Standing balance comment: reliance on RW       ADL either performed or assessed with clinical judgement        Vision Patient Visual Report: No change from baseline            Cognition Arousal/Alertness: Awake/alert Behavior During Therapy: WFL for tasks assessed/performed Overall Cognitive Status: Within Functional Limits for tasks assessed                       Pertinent Vitals/ Pain       Pain Assessment: Faces Faces Pain Scale: No hurt         Frequency  Min 2X/week        Progress Toward Goals  OT Goals(current goals can now be found in the care plan section)  Progress towards OT goals: Progressing toward goals  Acute Rehab OT Goals  Patient Stated Goal: to get better OT Goal Formulation: With patient Time For Goal Achievement: 09/21/20 Potential to Achieve Goals: Good  Plan Discharge plan remains appropriate       AM-PAC OT "6 Clicks" Daily Activity     Outcome Measure   Help from another person eating meals?: None Help from another person taking care of personal grooming?: A Little Help from another person toileting, which includes using toliet, bedpan, or urinal?: A Lot Help from another person bathing (including washing, rinsing,  drying)?: A Lot Help from another person to put on and taking off regular upper body clothing?: A Lot Help from another person to put on and taking off regular lower body clothing?: A Lot 6 Click Score: 15    End of Session    OT Visit Diagnosis: Muscle weakness (generalized) (M62.81);Unsteadiness on feet (R26.81)   Activity Tolerance Patient limited by fatigue   Patient Left in chair;with call bell/phone within reach   Nurse Communication Mobility status;Precautions (purewick)        Time: 7106-2694 OT Time Calculation (min): 31 min  Charges: OT General Charges $OT Visit: 1 Visit OT Treatments $Therapeutic Activity: 8-22 mins $Therapeutic Exercise: 8-22 mins  Darleen Crocker, MS, OTR/L , CBIS ascom [phone]  09/09/20, 3:29 PM

## 2020-09-09 NOTE — Progress Notes (Signed)
Inpatient Rehab Admissions Coordinator Note:   Per therapy recommendations, pt was screened for CIR candidacy by Emigdio Wildeman, MS CCC-SLP. At this time, Pt. Appears to have functional decline and is a good candidate for CIR. Will request order for rehab consult per protocol.  Please contact me with questions.   Frannie Shedrick, MS, CCC-SLP Rehab Admissions Coordinator  [phone] (celll) [phone] (office)  

## 2020-09-09 NOTE — Progress Notes (Signed)
PT Cancellation Note  Patient Details Name: Kathy Vega MRN: [id number] DOB: December 21, 1975   Cancelled Treatment:    Reason Eval/Treat Not Completed: Other (comment).  Attempted to see pt this afternoon but pt was participating in OT session.  Therapist returned later but pt then had started on trach collar (per discussion with respiratory and nurse, will hold therapy at this time and re-attempt PT session tomorrow morning).  Leitha Bleak, PT 09/09/20, 4:30 PM

## 2020-09-09 NOTE — Progress Notes (Addendum)
Readmission Risk Score increased so CSW called patient's spouse to attempt to complete Readmission Screening and discuss PT recommending CIR when patient is able to transfer. Left a voicemail requesting a return call.  Oleh Genin, [place]

## 2020-09-09 NOTE — Progress Notes (Addendum)
Patient placed on ATC 35% and cuff deflated at this time.  Patient tolerating well.Patient on ATC 35% for 1.5 hours and then placed back on vent PSV 5/5 @ 35%.

## 2020-09-10 LAB — BASIC METABOLIC PANEL
Anion gap: 13 (ref 5–15)
BUN: 27 mg/dL — ABNORMAL HIGH (ref 6–20)
CO2: 25 mmol/L (ref 22–32)
Calcium: 9.3 mg/dL (ref 8.9–10.3)
Chloride: 107 mmol/L (ref 98–111)
Creatinine, Ser: 0.75 mg/dL (ref 0.44–1.00)
GFR, Estimated: >60 mL/min (ref >60)
Glucose, Bld: 144 mg/dL — ABNORMAL HIGH (ref 70–99)
Potassium: 3.5 mmol/L (ref 3.5–5.1)
Sodium: 145 mmol/L (ref 135–145)

## 2020-09-10 LAB — CBC WITH DIFFERENTIAL/PLATELET
Abs Immature Granulocytes: 0.11 10*3/uL — ABNORMAL HIGH (ref 0.00–0.07)
Basophils Absolute: 0 10*3/uL (ref 0.0–0.1)
Basophils Relative: 1 %
Eosinophils Absolute: 0.2 10*3/uL (ref 0.0–0.5)
Eosinophils Relative: 2 %
HCT: 33.1 % — ABNORMAL LOW (ref 36.0–46.0)
Hemoglobin: 10.1 g/dL — ABNORMAL LOW (ref 12.0–15.0)
Immature Granulocytes: 2 %
Lymphocytes Relative: 13 %
Lymphs Abs: 0.9 10*3/uL (ref 0.7–4.0)
MCH: 25.1 pg — ABNORMAL LOW (ref 26.0–34.0)
MCHC: 30.5 g/dL (ref 30.0–36.0)
MCV: 82.1 fL (ref 80.0–100.0)
Monocytes Absolute: 0.5 10*3/uL (ref 0.1–1.0)
Monocytes Relative: 7 %
Neutro Abs: 5.4 10*3/uL (ref 1.7–7.7)
Neutrophils Relative %: 75 %
Platelets: 325 10*3/uL (ref 150–400)
RBC: 4.03 MIL/uL (ref 3.87–5.11)
RDW: 25.5 % — ABNORMAL HIGH (ref 11.5–15.5)
Smear Review: NORMAL
WBC: 7.1 10*3/uL (ref 4.0–10.5)
nRBC: 0 % (ref 0.0–0.2)

## 2020-09-10 LAB — GLUCOSE, CAPILLARY
Glucose-Capillary: 155 mg/dL — ABNORMAL HIGH (ref 70–99)
Glucose-Capillary: 135 mg/dL — ABNORMAL HIGH (ref 70–99)
Glucose-Capillary: 115 mg/dL — ABNORMAL HIGH (ref 70–99)
Glucose-Capillary: 118 mg/dL — ABNORMAL HIGH (ref 70–99)
Glucose-Capillary: 115 mg/dL — ABNORMAL HIGH (ref 70–99)

## 2020-09-10 LAB — MAGNESIUM: Magnesium: 2.4 mg/dL (ref 1.7–2.4)

## 2020-09-10 LAB — PHOSPHORUS: Phosphorus: 2.8 mg/dL (ref 2.5–4.6)

## 2020-09-10 MED ORDER — FUROSEMIDE 10 MG/ML IJ SOLN
20.0000 mg | Freq: Every day | INTRAMUSCULAR | Status: DC
  Administered 2020-09-11: 20 mg via INTRAVENOUS
  Filled 2020-09-10: qty 2

## 2020-09-10 MED ORDER — TRAZODONE HCL 100 MG PO TABS
100.0000 mg | ORAL_TABLET | Freq: Every day | ORAL | Status: DC
  Administered 2020-09-10 – 2020-09-11 (×2): 100 mg via ORAL
  Filled 2020-09-10 (×2): qty 1

## 2020-09-10 MED ORDER — POTASSIUM CHLORIDE 20 MEQ PO PACK
40.0000 meq | PACK | Freq: Once | ORAL | Status: AC
  Administered 2020-09-10: 40 meq

## 2020-09-10 MED ORDER — SERTRALINE HCL 50 MG PO TABS
50.0000 mg | ORAL_TABLET | Freq: Every day | ORAL | Status: DC
  Filled 2020-09-10 (×3): qty 1

## 2020-09-10 NOTE — Progress Notes (Signed)
Physical Therapy Treatment Patient Details Name: Kathy Vega MRN: [id number] DOB: [birth year] Today's Date: 09/10/2020    History of Present Illness Pt is a 44 y.o. female presenting to hospital 9/16 with SOB and confusion; initial O2 sats in 20's; pt COVID (+) 6 days prior.  Mechanically intubated 9/17.  S/p trach 10/8.  Pt admitted with acute hypoxic respiratory failure secondary COVID-19 PNA and ARDS, possible OSA with upper airway swelling s/p trach, and severe acute hypoxic and hypercapnic respiratory failure.  PMH includes anemia, thyroid disease, h/o leg surgery.    PT Comments    Pt resting in bed upon PT arrival and requesting to get OOB.  Nursing called respiratory to come assist with pt's mobility and respiratory prepared portable ventilator for ambulation.  Pt able to ambulate 20 feet x2 with RW CGA to min assist x2 plus respiratory assist, chair follow, and nursing monitoring pt's vitals.  Pt's HR fluctuating between 124 bpm to up to 152 bpm (pt given standing rest breaks when HR elevated to around 150 bpm and then pt's HR would gradually decrease back down).  HR fluctuating 116-127 bpm at rest in recliner (facing window per pt request) end of session.  O2 sats WFL during sessions activities.  Pt did well tolerating sessions activities.  Will continue to focus on strengthening and progressive functional mobility per pt tolerance.   Follow Up Recommendations  CIR     Equipment Recommendations  Rolling walker with 5" wheels;3in1 (PT)    Recommendations for Other Services OT consult     Precautions / Restrictions Precautions Precautions: Fall Precaution Comments: Trach (mech vent); HOB >30 degrees; Dobhoff Restrictions Weight Bearing Restrictions: No    Mobility  Bed Mobility Overal bed mobility: Needs Assistance Bed Mobility: Rolling;Supine to Sit Rolling: Mod assist   Supine to sit: Mod assist;HOB elevated     General bed mobility comments: assist for trunk; vc's  for technique; increased effort to perform  Transfers Overall transfer level: Needs assistance Equipment used: Rolling walker (2 wheeled) Transfers: Sit to/from Stand Sit to Stand: Min assist;Mod assist;+2 safety/equipment         General transfer comment: x1 trial standing from bed and x1 trial standing from recliner; vc's for UE positioning and walker use  Ambulation/Gait Ambulation/Gait assistance: Min guard;Min assist;+2 safety/equipment (chair follow; respiratory assist) Gait Distance (Feet):  (20 feet x2) Assistive device: Rolling walker (2 wheeled)   Gait velocity: decreased   General Gait Details: decreased B LE step length/foot clearance/heelstrike; more narrow BOS; slow but steady walking   Stairs             Wheelchair Mobility    Modified Rankin (Stroke Patients Only)       Balance Overall balance assessment: Needs assistance Sitting-balance support: No upper extremity supported;Feet supported Sitting balance-Leahy Scale: Good Sitting balance - Comments: steady sitting reaching within BOS   Standing balance support: Single extremity supported Standing balance-Leahy Scale: Fair Standing balance comment: pt requiring at least single UE support for static standing balance                            Cognition Arousal/Alertness: Awake/alert Behavior During Therapy: WFL for tasks assessed/performed Overall Cognitive Status: Within Functional Limits for tasks assessed  Exercises Total Joint Exercises Ankle Circles/Pumps: AROM;Strengthening;Both;10 reps;Supine Short Arc Quad: AAROM;Right;AROM;Left;Strengthening;10 reps;Supine Heel Slides: AAROM;Right;AROM;Left;Strengthening;10 reps;Supine Hip ABduction/ADduction: AAROM;Right;AROM;Left;Strengthening;10 reps;Supine    General Comments   Nursing cleared pt for participation in physical therapy.  Pt agreeable to PT session.       Pertinent Vitals/Pain Pain Assessment: No/denies pain Pain Intervention(s): Limited activity within patient's tolerance;Monitored during session;Repositioned    Home Living                      Prior Function            PT Goals (current goals can now be found in the care plan section) Acute Rehab PT Goals Patient Stated Goal: to get better PT Goal Formulation: With patient Time For Goal Achievement: 09/21/20 Potential to Achieve Goals: Good Progress towards PT goals: Progressing toward goals    Frequency    7X/week      PT Plan Current plan remains appropriate    Co-evaluation              AM-PAC PT "6 Clicks" Mobility   Outcome Measure  Help needed turning from your back to your side while in a flat bed without using bedrails?: A Little Help needed moving from lying on your back to sitting on the side of a flat bed without using bedrails?: A Lot Help needed moving to and from a bed to a chair (including a wheelchair)?: A Lot Help needed standing up from a chair using your arms (e.g., wheelchair or bedside chair)?: A Lot Help needed to walk in hospital room?: Total Help needed climbing 3-5 steps with a railing? : Total 6 Click Score: 11    End of Session Equipment Utilized During Treatment: Oxygen (trach (mech vent)) Activity Tolerance: Patient tolerated treatment well Patient left: in chair;with call bell/phone within reach;with nursing/sitter in room;Other (comment) (respiratory in room) Nurse Communication: Mobility status;Precautions;Other (comment) (pt's vitals during session) PT Visit Diagnosis: Other abnormalities of gait and mobility (R26.89);Muscle weakness (generalized) (M62.81);Difficulty in walking, not elsewhere classified (R26.2)     Time: 3354-5625 PT Time Calculation (min) (ACUTE ONLY): 40 min  Charges:  $Gait Training: 8-22 mins $Therapeutic Exercise: 8-22 mins $Therapeutic Activity: 8-22 mins                    Leitha Bleak, PT 09/10/20, 10:44 AM

## 2020-09-10 NOTE — TOC Initial Note (Signed)
Transition of Care [facility]) - Initial/Assessment Note    Patient Details  Name: Kathy Vega MRN: [id number] Date of Birth: [birth year]  Transition of Care [facility]) CM/SW Contact:    Magnus Ivan, LCSW Phone Number: 09/10/2020, 11:49 AM  Clinical Narrative:           CSW attempted call to spouse again for Readmission Screen. No answer. Called patient's daughter and completed screening. Per daughter, patient lives with her spouse and 2 minor children. PCP and Pharmacy is [facility]. Patient also uses [facility] on [address] at times. Spouse provides transportation to appointments. Patient had [facility] in the past when she lived in [place], no SNF or DME history.   Informed daughter of PT recommending CIR when patient is medically ready. She reported she thinks patient would be agreeable to CIR. She also thinks patient would be agreeable to SNF if CIR does not have availability at that time. She reported they do not have a preference on SNF. Would need to try to find SNF that could take patient with new trach if CIR is not available at time of discharge.  CSW will continue to follow.     Expected Discharge Plan: IP Rehab Facility Barriers to Discharge: Continued Medical Work up   Patient Goals and CMS Choice   CMS Medicare.gov Compare Post Acute Care list provided to:: Patient Represenative (must comment) Choice offered to / list presented to : Adult Children  Expected Discharge Plan and Services Expected Discharge Plan: [place] arrangements for the past 2 months: Single Family Home                                      Prior Living Arrangements/Services Living arrangements for the past 2 months: Single Family Home Lives with:: Spouse, Minor Children Patient language and need for interpreter reviewed:: Yes        Need for Family Participation in Patient Care: Yes (Comment) Care giver support system in place?: Yes (comment)   Criminal  Activity/Legal Involvement Pertinent to Current Situation/Hospitalization: No - Comment as needed  Activities of Daily Living      Permission Sought/Granted Permission sought to share information with : Chartered certified accountant granted to share information with : Yes, Verbal Permission Granted (by daughter)     Permission granted to share info w AGENCY: CIR, SNFs        Emotional Assessment         Alcohol / Substance Use: Not Applicable Psych Involvement: No (comment)  Admission diagnosis:  Elevated liver enzymes [R74.8] Acute respiratory failure with hypoxia ([place]) [J96.01] Pneumonia due to COVID-19 virus [U07.1, J12.82] Patient Active Problem List   Diagnosis Date Noted  . Acute respiratory failure ([place])   . COVID-19 08/13/2020  . Acute renal failure (ARF) ([place]) 08/13/2020  . Elevated liver enzymes 08/13/2020  . CAP (community acquired pneumonia) 08/13/2020  . Acute encephalopathy 08/13/2020  . Hypokalemia 08/13/2020   PCP:  [facility] [place]:   [place], [place] - [place] AT [facility] [address] [place] [address] Phone: [phone] Fax: [phone]     Social Determinants of Health (SDOH) Interventions    Readmission Risk Interventions Readmission Risk Prevention Plan 09/10/2020  Transportation Screening Complete  PCP or Specialist Appt within 3-5 Days Complete  HRI or  Home Care Consult Complete  Social Work Consult for [place] Planning/Counseling Complete  Palliative Care Screening Complete  Medication Review Press photographer) Complete  Some recent data might be hidden

## 2020-09-10 NOTE — Progress Notes (Signed)
   09/10/20 1100  Clinical Encounter Type  Visited With Patient and family together  Visit Type Follow-up  Referral From Chaplain  Consult/Referral To Chaplain  while rounding the unit, chaplain stopped in to see Pt, but didn't stay long because she has visitors. Pt's husband and son are in with her. Pt told chaplain by writing it down, "Testimony to God." Chaplain agreed and told her to tell it wherever she goes. Chaplain complimented her on walking. Chaplain will check on her tomorrow.

## 2020-09-10 NOTE — Progress Notes (Signed)
PHARMACY CONSULT NOTE - FOLLOW UP  Pharmacy Consult for Electrolyte Monitoring and Replacement   Recent Labs: Potassium (mmol/L)  Date Value  09/10/2020 3.5  03/03/2014 3.9   Magnesium (mg/dL)  Date Value  09/10/2020 2.4   Calcium (mg/dL)  Date Value  09/10/2020 9.3   Calcium, Total (mg/dL)  Date Value  03/03/2014 9.3   Albumin (g/dL)  Date Value  08/25/2020 2.3 (L)  03/03/2014 4.2   Phosphorus (mg/dL)  Date Value  09/10/2020 2.8   Sodium (mmol/L)  Date Value  09/10/2020 145  03/03/2014 140     Assessment: 44 year old female admitted with COVID, intubated and sedated in the ICU. Patient is now s/p trach placement. Remains on tube feeds at this time. Pharmacy consult for electrolyte replacement.  Lasix 20 mg IV q12h > q24h 10/14  Goal of Therapy:  Electrolytes WNL  Plan:  Hypokalemia likely s/t diuresis. Has improved. Lasix decreased to once daily today. Will give potassium 40 mEq per tube for one dose today to maintain WNL. Follow up electrolytes with morning labs.  Tawnya Crook ,PharmD Clinical Pharmacist 09/10/2020 11:41 AM

## 2020-09-10 NOTE — Progress Notes (Addendum)
NAME:  Kathy Vega, MRN:  [id number], DOB:  02-Sep-1976, LOS: 11 ADMISSION DATE:  08/13/2020, CONSULTATION DATE:  08/13/2020  Brief History   44 yo unvaccinated female admitted with COVID-19 pneumonia & ARDS requiring intubation and mechanical ventilation.  Past Medical History  Thyroid disease Anemia  Significant Hospital Events   09/16: Pt admitted to ICU requiring Heated HFNC and NRB, however remained in the ER pending ICU bed availability  09/16: CT Head revealed no acute intracranial abnormality 09/17: Pt with worsening hypoxia requiring mechanical intubation 09/18: [facility] refused to accept pt for ECMO 09/21: Attempted to transfer pt to [facility] and [facility] hospital for possible assessment for ECMO, however both hospitals DO NOT have bed availability and are at capacity and pt may or may NOT be an ECMO candidate at this time  09/23: Pt unproned  09/24:  Resumed proning protocol 09/25: Resumed proning protocol  09/26: Pt unproned 9/27-  No onvernight events, patient remains in ARDS on elevated settings. 08/25/20- patient is on maximal settings with sedation. She is on day 11 of her illness with little improvement.  Ive attempted to reach husband via cell no answer, ive successfully found her daughter and spoke with her regarding care plan and she is agreeable but requested for me to speak with Kathy Vega (husband ) and has provided me with secondary phone number [phone] but again only voice mail was reachable.  Have left voicemail and will discuss trache with family when they are available.  08/26/20- patient without overnight events, ive called POA husband again today but was unable to reach him and left voice mail.  08/27/20- patient critically ill with overall poor prognosis, she had desaturation event today and was difficult to improve requiring multiple changes on ventilator by RT.  08/28/20- Patient is weaned to 60% FiO2 on MV.  Have called Kathy Vega today and updated him. We  discussed trache and he is agreeable.  08/29/20- Spoke with husband today, answered questions ,updated on care plan,  he is ready to speak with ENT regarding trache palcement 08/30/20- patient had SBT today she was awake and followed verbal communication but after 2 hours had severly labored breathing and thick high volume secretions.  I have met with husband Kathy Vega and he was able to see patient at bedside we discussed trache. I have updated Dr Tami Ribas regarding trache plan.  08/31/20 - no airleak will need trach, discussed with Dr.Vaught. 09/01/20-Still no air leak. Patient has trach scheduled for Friday of this week and thus no need for steroids at this time 09/02/20- no air leak, continuing plan of care, no other concerns 09/03/20- very minimal air leak, continue with trach 10/8 09/04/20- Trach placed 09/07/20- Patient OOB with lift, weaned to 5/5, PICC & foley removed 09/08/20- tolerated 1 hr TC, OOB to chair without lift, pivoted on her own strength 09/09/20- patient not sleeping well O/N, bad dreams and overall feeling "down", tolerated TC 2 hours  Consults:  Surgery  Procedures:  ETT 9/17 >> 10/8 PICC 9/17 >> 10/11 Tracheostomy 10/8 >>  Significant Diagnostic Tests:    Micro Data:  MRSA PCR 9/16 >> negative BC 9/16 >> negative Tracheal aspirate 10/11 >> Pseudomonas aeruginosa  Antimicrobials:  Metronidazole 9/24 >> 9/29 Cefepime 10/11 >>  Interim history/subjective:  Patient sitting up in bed, asking for water to drink and wanting to sit up so she can look out the window all day today.  Discussed with RN team. When asked if patient was feeling anxious/restless  or down/depressed, she gestured a thumbs down motion. Patient walked out of the room with walker and staff assist  Overnight- care RN stated only slept 3 hours Labs/ Imaging personally reviewed Net: + 795 mL (- 5 L since admit) Na+/ K+: 145/ 3.5 BUN/Cr.: 27/ 0.75 Hgb: 10.1 TMAX/ WBC: 37.3/ 7.1  Objective    Blood pressure 127/82, pulse (!) 115, temperature 99.1 F (37.3 C), temperature source Oral, resp. rate (!) 23, height 5' 9.13" (1.756 m), weight 109.7 kg, SpO2 99 %.    Vent Mode: SIMV/PC/PS FiO2 (%):  [35 %] 35 % Set Rate:  [12 bmp] 12 bmp Vt Set:  [420 mL] 420 mL PEEP:  [5 cmH20] 5 cmH20 Pressure Support:  [5 cmH20-10 cmH20] 10 cmH20   Intake/Output Summary (Last 24 hours) at 09/10/2020 0809 Last data filed at 09/10/2020 0600 Gross per 24 hour  Intake 2395.25 ml  Output 1600 ml  Net 795.25 ml   Filed Weights   09/08/20 0447 09/09/20 0500 09/10/20 0500  Weight: 109.4 kg 109.1 kg 109.7 kg    Examination: General: Adult female, lying in bed tracheostomy requiring mechanical ventilation, NAD HEENT: MM pink/moist, anicteric  Neuro: Alert & oriented, writing/ mouthing responses appropriately, follows commands, PERRL +3, MAE CV: s1s2 RRR, NSR on monitor, no r/m/g Pulm: Regular, non labored on SIMV at 35%, breath sounds rhonci-BUL & diminished-BLL GI: soft, rounded, bs x 4 GU: pure wick in place with clear yellow urine Skin: known DTI on buttocks, no rashes/lesions noted Extremities: warm/dry, pulses + 2 R/P, no edema noted  Resolved Hospital Problem list   - Off of airborne/ contact precautions - Elevated Liver Enzymes  Assessment & Plan:   Acute hypoxic respiratory failure secondary to COVID-19 pneumonia and ARDS Morbid Obesity, possible OSA  S/p course of remdesevir, azithromycin & ceftriaxone. Off airborne precautions. Tracheostomy placed 10/8. Cefepime started 10/11 for suspected HCAP  - Ventilator settings: SIMV 35%, 7 mL/kg with daily TC trials & PSV 5/5, plan 2-4 hour TC trial 10/14 - RASS goal 0 - ordered trazedone QHS as a sleep aide, discontinued seroquel - ordered sertraline 50 mg QD to assist with feelings of depression - continue vitamin C & zinc - PT/OT following, mobilize as tolerated- OOB to chair daily as tolerated - intermittent CXR, ABG  PRN  Leukocytosis- due to possible LLL pneumonia WBC 15.2 - 10/10, low grade fevers, PCT 0.46~ 0.42~ 0.20 CXR 10/11 shows worsening aeration in LLL. PICC & foley removed- 10/11  - Continue Cefepime, tracheal aspirate positive for pseudomonas - monitor WBC/ fever curve - daily CBC  Troponin elevation due to demand ischemia Hypertrophic cardiomyopathy Echo: LVEF 45-50% with severe concentric LVH, diastolic dysfunction  - no pressor requirement - continuous cardiac monitoring - reduced Lasix to QD, continue as tolerated  Mild acute renal failure -resolved Hypokalemia -resolved Hypernatremia -resolved  - Strict I/O's: alert provider if UOP < 0.5 mL/kg/hr - Daily BMP, replace electrolytes PRN - Avoid nephrotoxic agents as able, ensure adequate renal perfusion  Anemia without obvious acute blood loss Last transfusion 9/23, Hgb stable  - Monitor for s/s of bleeding - Daily CBC - Transfuse for Hgb <7  Hyperglycemia   - Q 4 CBG, SSI  Best practice:   Diet: NPO with TF Pain/Anxiety/Delirium protocol (if indicated): morphine Q 4 PRN, seroquel QHS VAP protocol (if indicated): established DVT prophylaxis: Heparin SQ & SCDs GI prophylaxis: Pepcid Glucose control: Q 4 with SSI, target range 140-180 Mobility: Bedrest Code Status: FULL Family Communication: Patient  updated bedside- 10/14 Disposition: ICU  Labs   CBC: Recent Labs  Lab 09/06/20 0425 09/07/20 0526 09/08/20 0429 09/09/20 0347 09/10/20 0418  WBC 15.2* 13.7* 10.3 8.2 7.1  NEUTROABS 13.1* 11.6* 8.3* 6.4 5.4  HGB 10.0* 9.3* 9.5* 9.6* 10.1*  HCT 32.5* 30.7* 31.4* 32.4* 33.1*  MCV 79.9* 81.2 81.6 82.9 82.1  PLT 280 248 269 303 539    Basic Metabolic Panel: Recent Labs  Lab 09/06/20 0425 09/07/20 0526 09/08/20 0429 09/09/20 0347 09/10/20 0418  NA 138 141 143 143 145  K 3.8 3.7 3.1* 3.0* 3.5  CL 98 102 103 104 107  CO2 29 29 26 28 25   GLUCOSE 125* 115* 121* 138* 144*  BUN 31* 26* 26* 27* 27*   CREATININE 1.06* 0.94 0.94 0.83 0.75  CALCIUM 9.3 9.3 9.5 9.4 9.3  MG 2.4 2.5* 2.4 2.4 2.4  PHOS 4.7* 3.9 4.1 3.3 2.8   GFR: Estimated Creatinine Clearance: 118.7 mL/min (by C-G formula based on SCr of 0.75 mg/dL). Recent Labs  Lab 09/07/20 0526 09/08/20 0429 09/09/20 0347 09/10/20 0418  PROCALCITON 0.46 0.42 0.20  --   WBC 13.7* 10.3 8.2 7.1    Liver Function Tests: No results for input(s): AST, ALT, ALKPHOS, BILITOT, PROT, ALBUMIN in the last 168 hours. No results for input(s): LIPASE, AMYLASE in the last 168 hours. No results for input(s): AMMONIA in the last 168 hours.  ABG    Component Value Date/Time   PHART 7.47 (H) 08/31/2020 0800   PCO2ART 53 (H) 08/31/2020 0800   PO2ART 61 (L) 08/31/2020 0800   HCO3 38.6 (H) 08/31/2020 0800   O2SAT 92.5 08/31/2020 0800     Coagulation Profile: No results for input(s): INR, PROTIME in the last 168 hours.  Cardiac Enzymes: No results for input(s): CKTOTAL, CKMB, CKMBINDEX, TROPONINI in the last 168 hours.  HbA1C: No results found for: HGBA1C  CBG: Recent Labs  Lab 09/09/20 1136 09/09/20 1550 09/09/20 1947 09/09/20 2322 09/10/20 0337  GLUCAP 117* 103* 144* 122* 155*     Critical care time: 35 minutes    Domingo Pulse [place], AGACNP-BC [facility]    Please see Amion for pager details.

## 2020-09-10 NOTE — Progress Notes (Signed)
Pt placed on trach collar 8L 35% after ambulation on ventilator

## 2020-09-11 LAB — CBC WITH DIFFERENTIAL/PLATELET
Abs Immature Granulocytes: 0.15 10*3/uL — ABNORMAL HIGH (ref 0.00–0.07)
Basophils Absolute: 0 10*3/uL (ref 0.0–0.1)
Basophils Relative: 1 %
Eosinophils Absolute: 0.1 10*3/uL (ref 0.0–0.5)
Eosinophils Relative: 2 %
HCT: 32.3 % — ABNORMAL LOW (ref 36.0–46.0)
Hemoglobin: 9.7 g/dL — ABNORMAL LOW (ref 12.0–15.0)
Immature Granulocytes: 2 %
Lymphocytes Relative: 11 %
Lymphs Abs: 0.9 10*3/uL (ref 0.7–4.0)
MCH: 24.7 pg — ABNORMAL LOW (ref 26.0–34.0)
MCHC: 30 g/dL (ref 30.0–36.0)
MCV: 82.4 fL (ref 80.0–100.0)
Monocytes Absolute: 0.5 10*3/uL (ref 0.1–1.0)
Monocytes Relative: 7 %
Neutro Abs: 6.2 10*3/uL (ref 1.7–7.7)
Neutrophils Relative %: 77 %
Platelets: 319 10*3/uL (ref 150–400)
RBC: 3.92 MIL/uL (ref 3.87–5.11)
RDW: 25.4 % — ABNORMAL HIGH (ref 11.5–15.5)
WBC: 7.9 10*3/uL (ref 4.0–10.5)
nRBC: 0 % (ref 0.0–0.2)

## 2020-09-11 LAB — GLUCOSE, CAPILLARY
Glucose-Capillary: 110 mg/dL — ABNORMAL HIGH (ref 70–99)
Glucose-Capillary: 125 mg/dL — ABNORMAL HIGH (ref 70–99)
Glucose-Capillary: 126 mg/dL — ABNORMAL HIGH (ref 70–99)
Glucose-Capillary: 126 mg/dL — ABNORMAL HIGH (ref 70–99)
Glucose-Capillary: 131 mg/dL — ABNORMAL HIGH (ref 70–99)
Glucose-Capillary: 141 mg/dL — ABNORMAL HIGH (ref 70–99)
Glucose-Capillary: 143 mg/dL — ABNORMAL HIGH (ref 70–99)

## 2020-09-11 LAB — BASIC METABOLIC PANEL
Anion gap: 10 (ref 5–15)
BUN: 26 mg/dL — ABNORMAL HIGH (ref 6–20)
CO2: 24 mmol/L (ref 22–32)
Calcium: 9.7 mg/dL (ref 8.9–10.3)
Chloride: 108 mmol/L (ref 98–111)
Creatinine, Ser: 0.79 mg/dL (ref 0.44–1.00)
GFR, Estimated: >60 mL/min (ref >60)
Glucose, Bld: 141 mg/dL — ABNORMAL HIGH (ref 70–99)
Potassium: 4 mmol/L (ref 3.5–5.1)
Sodium: 142 mmol/L (ref 135–145)

## 2020-09-11 LAB — MAGNESIUM: Magnesium: 2.4 mg/dL (ref 1.7–2.4)

## 2020-09-11 LAB — PHOSPHORUS: Phosphorus: 2.8 mg/dL (ref 2.5–4.6)

## 2020-09-11 MED ORDER — FLUCONAZOLE 100 MG PO TABS: 100.0000 mg | ORAL_TABLET | ORAL | Status: DC

## 2020-09-11 MED ORDER — NYSTATIN 100000 UNIT/ML MT SUSP
5.0000 mL | Freq: Four times a day (QID) | OROMUCOSAL | Status: DC
  Administered 2020-09-11 – 2020-10-01 (×63): 500000 [IU] via ORAL
  Filled 2020-09-11 (×71): qty 5

## 2020-09-11 MED ORDER — FLUCONAZOLE 100MG IVPB
100.0000 mg | INTRAVENOUS | Status: AC
  Administered 2020-09-11: 100 mg via INTRAVENOUS
  Filled 2020-09-11: qty 50

## 2020-09-11 NOTE — Progress Notes (Signed)
Occupational Therapy Treatment Patient Details Name: Kathy Vega MRN: [id number] DOB: 04-16-1976 Today's Date: 09/11/2020    History of present illness Pt is a 44 y.o. female presenting to hospital 9/16 with SOB and confusion; initial O2 sats in 20's; pt COVID (+) 6 days prior.  Mechanically intubated 9/17.  S/p trach 10/8.  Pt admitted with acute hypoxic respiratory failure secondary COVID-19 PNA and ARDS, possible OSA with upper airway swelling s/p trach, and severe acute hypoxic and hypercapnic respiratory failure.  PMH includes anemia, thyroid disease, h/o leg surgery.   OT comments  Upon entering the room, pt supine in bed receiving nursing care. Pt is agreeable to OT intervention but declined OOB this session. Pt is on 6-7th hour with trach collar on and trach recently downsized. Pt also having worked with several disciplines today. Pt utilized dry erase board for communication and reports her son is having a birthday on Sunday. She appears to have more of a flat affect during this session and reports she is "tired" many times throughout. Pt performing bed level exercises 2 sets of 10 bicep curls, alternating punches, "cherry pickers" in all directions, ankle pumps, B LE adduction/adduction, and knee raises. Pt taking rest breaks as needed. Vitals remained WNLs. OT assisted pt with repositioning for comfort. All needs within reach upon exiting the room.   Follow Up Recommendations  CIR;Supervision/Assistance - 24 hour    Equipment Recommendations  Other (comment) (defer to next venue of care)       Precautions / Restrictions Precautions Precautions: Fall Precaution Comments: Trach (mech vent vs trach collar); HOB >30 degrees; Dobhoff Restrictions Weight Bearing Restrictions: No       Mobility Bed Mobility Overal bed mobility: Needs Assistance Bed Mobility: Rolling Rolling: Mod assist   Supine to sit: Min assist;Mod assist     General bed mobility comments: assist for trunk;  vc's for technique; increased effort to perform  Transfers Overall transfer level: Needs assistance Equipment used: Rolling walker (2 wheeled) Transfers: Sit to/from Stand Sit to Stand: Min assist         General transfer comment: x1 trial standing from bed and x1 trial standing from recliner; vc's for UE placement    Balance Overall balance assessment: Needs assistance Sitting-balance support: No upper extremity supported;Feet supported Sitting balance-Leahy Scale: Good Sitting balance - Comments: steady sitting reaching within BOS   Standing balance support: Single extremity supported   Standing balance comment: pt requiring at least single UE support for dynamic standing balance          ADL either performed or assessed with clinical judgement        Vision Patient Visual Report: No change from baseline            Cognition Arousal/Alertness: Awake/alert Behavior During Therapy: WFL for tasks assessed/performed Overall Cognitive Status: Within Functional Limits for tasks assessed          General Comments: Pt using dry erase board for communication                   Pertinent Vitals/ Pain       Pain Assessment: No/denies pain         Frequency  Min 2X/week        Progress Toward Goals  OT Goals(current goals can now be found in the care plan section)  Progress towards OT goals: Progressing toward goals  Acute Rehab OT Goals Patient Stated Goal: to get better OT Goal Formulation: With patient  Time For Goal Achievement: 09/21/20 Potential to Achieve Goals: Good  Plan Discharge plan remains appropriate       AM-PAC OT "6 Clicks" Daily Activity     Outcome Measure   Help from another person eating meals?: A Little Help from another person taking care of personal grooming?: A Little Help from another person toileting, which includes using toliet, bedpan, or urinal?: A Lot Help from another person bathing (including washing, rinsing,  drying)?: A Lot Help from another person to put on and taking off regular upper body clothing?: A Lot Help from another person to put on and taking off regular lower body clothing?: A Lot 6 Click Score: 14    End of Session    OT Visit Diagnosis: Muscle weakness (generalized) (M62.81);Unsteadiness on feet (R26.81)   Activity Tolerance Patient limited by fatigue   Patient Left in bed;with bed alarm set   Nurse Communication Precautions        Time: 4665-9935 OT Time Calculation (min): 24 min  Charges: OT General Charges $OT Visit: 1 Visit OT Treatments $Therapeutic Exercise: 23-37 mins  Darleen Crocker, MS, OTR/L , CBIS ascom [phone]  09/11/20, 3:29 PM

## 2020-09-11 NOTE — Progress Notes (Signed)
Pt Trach sutures removed, site cleaned with sterile water.Trach care completed. Pt removed from vent and placed on trach collar 8L 35%.

## 2020-09-11 NOTE — Progress Notes (Signed)
SLP Cancellation Note  Patient Details Name: Kathy Vega MRN: [id number] DOB: 03/06/1976   Cancelled treatment:       Reason Eval/Treat Not Completed: Medical issues which prohibited therapy;Patient not medically ready (chart reviewed; see note). Due to pt being unable to tolerate wear/use of PMV, the BSE was put on Hold until further time given for potential edema to resolve. Pt to be reassessed on Monday for PMV toleration. Hopefully, if able to tolerate PMV wear, BSE can be performed. If not, MBSS to be considered. NSG/MD updated and agreed. Pt and husband educated on POC.      Orinda Kenner, MS, CCC-SLP Speech Language Pathologist Rehab Services [phone] [facility] 09/11/2020, 2:13 PM

## 2020-09-11 NOTE — Evaluation (Signed)
[facility] Speaking [facility]ve - Evaluation Patient Details  Name: Kathy Vega MRN: [id number] Date of Birth: 31-Aug-1976  Today's Date: 09/11/2020 Time: 8295-6213 SLP Time Calculation (min) (ACUTE ONLY): 70 min  Past Medical History:  Past Medical History:  Diagnosis Date  . Anemia   . Thyroid disease    Past Surgical History:  Past Surgical History:  Procedure Laterality Date  . CHOLECYSTECTOMY    . LEG SURGERY    . TRACHEOSTOMY TUBE PLACEMENT N/A 09/04/2020   Procedure: TRACHEOSTOMY;  Surgeon: Kathy Gust, MD;  Location: [facility] ORS;  Service: ENT;  Laterality: N/A;   HPI:  (08/13/2020) Pt is a 44 yo female with a PMH of Hypothyroidism, morbid Obesity, and Anemia.  She presented to [facility] ER on 09/16 with c/o shortness of breath, headache, and confusion.  Upon arrival to the ER pts O2 sats were in the 20's she was placed on 5L of O2 via nasal canula, however her O2 sats only increased to 29%.  She was diagnosed with COVID-19 6 days prior to ER presentation, and she is unvaccinated.  She was placed on NRB and heated HFNC with increase in O2 sats to 93-95%.  Pt also noted to be confused and uncooperative likely secondary to taking a significant amount of cough syrup with codeine at home.  CT Head negative.  CXR revealed bilateral infiltrates.  PCCM team contacted for ICU admission.  She was orally intubated 08/14/2020 w/ OGT; traceheostomy 09/04/2020 w/ Dobhoff present for TFs support.  Pt has weaned to [facility] trials during the day w/ vent support at night as needed.    Assessment / Plan / Recommendation Clinical Impression  Pt seen today for PMV evaluation; A/O x4 and using writing and/or mouthing and gestures for communication w/ others. Pt's Tracheostomy was placed on 09/04/2020. Pt(later Husband arriving) were explained the PMV, its use for communication, and its care. Discussed the change in air flow w/ a tracheostomy and purpose of the PMV for verbal communication as well as its role  in swallowing. Pt has a Shiley #8XL cuffed at baseline. Finger occlusion was attempted initially w/ no redirected, superior airflow noted despite direction/encouragement. Upon placing the PMV, pt was only able to tolerate ~15 seconds w/out discomfort and air stacking noted. Upon removing the PMV, a rush of airflow was noted at the level of the trach. This was attempted x2. Consulted MD/Pulmonary who agreed w/ option of downsizing the trach to a Shiley #6XL cuffed to allow for more airflow around the trach for PMV use/wear. RT changed at the trach, and finger occlusion and PMV placement was attempted again. Similar results noted unfortunately w/ pt being unable to redirect airflow superiorly through the glottis for phonation. Breath stacking and rush of air noted again as the PMV was removed. Pt endorsed discomfort in breathing; unable to exhale. O2 sats dipped only the mid 90s, RR/HR elevated minimally but returned to Baseline post removal of PMV. Discussion was had w/ pt and husband re: inability to tolerate wearing of the PMV and potential issues such as Edema; RT agreed. Post discussion w/ MD/Pulmonary, will reassess again on Monday. Pt is now sitting up in the chair and walking to [place] station; she is A/O x4 able to write/mouth for communication w/ others. This improved medical status could bode well for improvement for toleration of PMV placement/wear.   Of note, during oral exam(wnl), pt appeared to present w/ Thrush(full coating of tongue). MD/Pulmonary informed and ordered txs. Discussed frequent oral care(practiced w/  model) and Plan for Few Pleasure Ice Chips Post Oral Care for the weekend -- w/ limitation d/t concern for oral intake w/out wearing the PMV. MD/NSG agreed. Instructions posted for ice chips, oral care. Pt and Husband agreed. SLP Visit Diagnosis: Aphonia (R49.1)    SLP Assessment  Patient needs continued Speech Lanaguage Pathology Services    Follow Up Recommendations  Inpatient Rehab  (TBD)    Frequency and Duration min 3x week  2 weeks    PMSV Trial PMSV was placed for: ~20 seconds Able to redirect subglottic air through upper airway: No Able to Attain Phonation: No Able to Expectorate Secretions: No attempts Level of Secretion Expectoration with PMSV: Not observed Breath Support for Phonation: Severely decreased Intelligibility: Not tested Respirations During Trial: 26 SpO2 During Trial: 98 % Pulse During Trial: 118 Behavior: Alert;Controlled;Cooperative;Expresses self well;Good eye contact;Responsive to questions   Tracheostomy Tube  Additional Tracheostomy Tube Assessment Trach Collar Period: during the day Secretion Description: min+ Frequency of Tracheal Suctioning: PRN Level of Secretion Expectoration: Tracheal    Vent Dependency  Vent Dependent:  (Night use; weaned to TC during the day)    Cuff Deflation Trial  GO Tolerated Cuff Deflation: Yes Length of Time for Cuff Deflation Trial: baseline on TC wean Behavior: Alert;Controlled;Cooperative;Expresses self well;Good eye contact;Responsive to questions;Smiling Cuff Deflation Trial - Comments: at baseline on TC         Beldon Nowling 09/11/2020, 2:06 PM Orinda Kenner, [place], [place] Speech Language Pathologist Rehab Services [phone]

## 2020-09-11 NOTE — Progress Notes (Addendum)
Physical Therapy Treatment Patient Details Name: Kathy Vega MRN: [id number] DOB: [birth year] Today's Date: 09/11/2020    History of Present Illness Pt is a 44 y.o. female presenting to hospital 9/16 with SOB and confusion; initial O2 sats in 20's; pt COVID (+) 6 days prior.  Mechanically intubated 9/17.  S/p trach 10/8.  Pt admitted with acute hypoxic respiratory failure secondary COVID-19 PNA and ARDS, possible OSA with upper airway swelling s/p trach, and severe acute hypoxic and hypercapnic respiratory failure.  PMH includes anemia, thyroid disease, h/o leg surgery.    PT Comments    Pt resting in bed (on trach collar) upon PT arrival; agreeable to walking.  Pt's nurse called respiratory to assist with ambulation/portable O2 equipment/set-up.  Min to mod assist semi-supine to sitting edge of bed and min assist with transfers with RW use.  Able to ambulate 50 feet x2 with RW CGA to min assist x1 plus respiratory assist and chair follow (trach collar set up on 8 L O2, 35% FiO2).  Pt with mild loss of balance initially with ambulation requiring assist for balance but improved steadiness noted with continued ambulation.  Pt walking slowly with RW focusing on breathing and pacing self (therapist/respiratory monitoring O2 sats and HR).  HR fluctuating 103-128 bpm at rest; HR increased up to 141-143 bpm with ambulation (HR briefly increased to 149-152 bpm--1 time each ambulation trial--pt cued to stop (pt preferred to stay standing) and with cueing for relaxation HR would then decrease again).  O2 sats WFL during sessions activities.  HR fluctuating back at 103-128 bpm at rest end of session.  Will continue to focus on strengthening and progressive functional mobility during hospitalization.   Follow Up Recommendations  CIR     Equipment Recommendations  Rolling walker with 5" wheels;3in1 (PT)    Recommendations for Other Services OT consult     Precautions / Restrictions  Precautions Precautions: Fall Precaution Comments: Trach (mech vent vs trach collar); HOB >30 degrees; Dobhoff Restrictions Weight Bearing Restrictions: No    Mobility  Bed Mobility Overal bed mobility: Needs Assistance Bed Mobility: Supine to Sit     Supine to sit: Min assist;Mod assist     General bed mobility comments: assist for trunk; vc's for technique; increased effort to perform  Transfers Overall transfer level: Needs assistance Equipment used: Rolling walker (2 wheeled) Transfers: Sit to/from Stand Sit to Stand: Min assist         General transfer comment: x1 trial standing from bed and x1 trial standing from recliner; vc's for UE placement  Ambulation/Gait Ambulation/Gait assistance: Min guard;Min assist;+2 safety/equipment (respiratory assist) Gait Distance (Feet):  (50 feet x2) Assistive device: Rolling walker (2 wheeled)   Gait velocity: decreased   General Gait Details: decreased B LE step length/foot clearance/heelstrike; more narrow BOS; slow but steady walking (except 1 mild loss of balance at beginning of ambulation requiring min assist for balance)   Stairs             Wheelchair Mobility    Modified Rankin (Stroke Patients Only)       Balance Overall balance assessment: Needs assistance Sitting-balance support: No upper extremity supported;Feet supported Sitting balance-Leahy Scale: Good Sitting balance - Comments: steady sitting reaching within BOS   Standing balance support: Single extremity supported   Standing balance comment: pt requiring at least single UE support for dynamic standing balance  Cognition Arousal/Alertness: Awake/alert Behavior During Therapy: WFL for tasks assessed/performed Overall Cognitive Status: Within Functional Limits for tasks assessed                                 General Comments: Pt using hand gestures or writing on dry erase board to  communicate      Exercises      General Comments   Nursing cleared pt for participation in physical therapy.  Pt agreeable to PT session.      Pertinent Vitals/Pain Pain Assessment: No/denies pain Faces Pain Scale: No hurt Pain Intervention(s): Limited activity within patient's tolerance;Monitored during session;Repositioned    Home Living                      Prior Function            PT Goals (current goals can now be found in the care plan section) Acute Rehab PT Goals Patient Stated Goal: to get better PT Goal Formulation: With patient Time For Goal Achievement: 09/21/20 Potential to Achieve Goals: Good Progress towards PT goals: Progressing toward goals    Frequency    7X/week      PT Plan Current plan remains appropriate    Co-evaluation              AM-PAC PT "6 Clicks" Mobility   Outcome Measure  Help needed turning from your back to your side while in a flat bed without using bedrails?: A Little Help needed moving from lying on your back to sitting on the side of a flat bed without using bedrails?: A Lot Help needed moving to and from a bed to a chair (including a wheelchair)?: A Little Help needed standing up from a chair using your arms (e.g., wheelchair or bedside chair)?: A Little Help needed to walk in hospital room?: A Little Help needed climbing 3-5 steps with a railing? : A Lot 6 Click Score: 16    End of Session Equipment Utilized During Treatment: Oxygen (trach collar with venti mask) Activity Tolerance: Patient tolerated treatment well Patient left: in chair;with call bell/phone within reach;with nursing/sitter in room Nurse Communication: Mobility status;Precautions;Other (comment) (pt's vitals during session) PT Visit Diagnosis: Other abnormalities of gait and mobility (R26.89);Muscle weakness (generalized) (M62.81);Difficulty in walking, not elsewhere classified (R26.2)     Time: 2800-3491 PT Time Calculation (min)  (ACUTE ONLY): 47 min  Charges:  $Gait Training: 8-22 mins $Therapeutic Exercise: 8-22 mins $Therapeutic Activity: 8-22 mins                     Leitha Bleak, PT 09/11/20, 4:08 PM

## 2020-09-11 NOTE — Progress Notes (Signed)
   09/11/20 1040  Clinical Encounter Type  Visited With Patient;Health care provider  Visit Type Follow-up  Referral From Chaplain  Consult/Referral To Chaplain  While in rounds, a nurse told chaplain Pt was walking. Chaplain left rounds and went to watch Pt. Once chaplain arrived to where Pt was and chaplain smiled at Pt and she waved. Chaplain was there to encourage Pt. Chaplain is looking for pictures of palm trees to put up in Pt's room because staff has been talking with her and she is interested vacation, beach, and palm trees.

## 2020-09-11 NOTE — Progress Notes (Signed)
NAME:  Kathy Vega, MRN:  [id number], DOB:  October 03, 1976, LOS: 49 ADMISSION DATE:  08/13/2020, CONSULTATION DATE:  08/13/2020  Brief History   44 yo unvaccinated female admitted with COVID-19 pneumonia & ARDS requiring intubation and mechanical ventilation.  Past Medical History  Thyroid disease Anemia  Significant Hospital Events   09/16: Pt admitted to ICU requiring Heated HFNC and NRB, however remained in the ER pending ICU bed availability  09/16: CT Head revealed no acute intracranial abnormality 09/17: Pt with worsening hypoxia requiring mechanical intubation 09/18: [facility] refused to accept pt for ECMO 09/21: Attempted to transfer pt to [facility] for possible assessment for ECMO, however both hospitals DO NOT have bed availability and are at capacity and pt may or may NOT be an ECMO candidate at this time  09/23: Pt unproned  09/24:  Resumed proning protocol 09/25: Resumed proning protocol  09/26: Pt unproned 9/27-  No onvernight events, patient remains in ARDS on elevated settings. 08/25/20- patient is on maximal settings with sedation. She is on day 11 of her illness with little improvement.  Ive attempted to reach husband via cell no answer, ive successfully found her daughter and spoke with her regarding care plan and she is agreeable but requested for me to speak with [name] and has provided me with secondary phone number [phone] but again only voice mail was reachable.  Have left voicemail and will discuss trache with family when they are available.  08/26/20- patient without overnight events, ive called POA husband again today but was unable to reach him and left voice mail.  08/27/20- patient critically ill with overall poor prognosis, she had desaturation event today and was difficult to improve requiring multiple changes on ventilator by RT.  08/28/20- Patient is weaned to 60% FiO2 on MV.  Have called Garlan Fillers today and updated him. We  discussed trache and he is agreeable.  08/29/20- Spoke with husband today, answered questions ,updated on care plan,  he is ready to speak with ENT regarding trache palcement 08/30/20- patient had SBT today she was awake and followed verbal communication but after 2 hours had severly labored breathing and thick high volume secretions.  I have met with husband Garlan Fillers and he was able to see patient at bedside we discussed trache. I have updated Dr Tami Ribas regarding trache plan.  08/31/20 - no airleak will need trach, discussed with Dr.Vaught. 09/01/20-Still no air leak. Patient has trach scheduled for Friday of this week and thus no need for steroids at this time 09/02/20- no air leak, continuing plan of care, no other concerns 09/03/20- very minimal air leak, continue with trach 10/8 09/04/20- Trach placed 09/07/20- Patient OOB with lift, weaned to 5/5, PICC & foley removed 09/08/20- tolerated 1 hr TC, OOB to chair without lift, pivoted on her own strength 09/09/20- patient not sleeping well O/N, bad dreams and overall feeling "down", tolerated TC 2 hours 09/11/20- patient is participating with PT while on trache collar and did well. Sutures removed. Will optimize for Stepdown status today.  FiO2 - 35%.  Pseudomonas on tracheal aspirate - plan for 7 d cefepime.  Trache size 8 switch to cuffed 6 today to allow for speech.    Consults:  Surgery  Procedures:  ETT 9/17 >> 10/8 PICC 9/17 >> 10/11 Tracheostomy 10/8 >>  Significant Diagnostic Tests:    Micro Data:  MRSA PCR 9/16 >> negative BC 9/16 >> negative Tracheal aspirate 10/11 >> Pseudomonas aeruginosa  Antimicrobials:  Metronidazole 9/24 >> 9/29 Cefepime 10/11 >>  Interim history/subjective:  Patient sitting up in bed, asking for water to drink and wanting to sit up so she can look out the window all day today.  Discussed with RN team. When asked if patient was feeling anxious/restless or down/depressed, she gestured a thumbs  down motion. Patient walked out of the room with walker and staff assist  Overnight- care RN stated only slept 3 hours Labs/ Imaging personally reviewed Net: + 795 mL (- 5 L since admit) Na+/ K+: 145/ 3.5 BUN/Cr.: 27/ 0.75 Hgb: 10.1 TMAX/ WBC: 37.3/ 7.1  Objective   Blood pressure 118/80, pulse (!) 102, temperature 99.2 F (37.3 C), temperature source Oral, resp. rate 18, height 5' 9.13" (1.756 m), weight 109.7 kg, SpO2 98 %.    Vent Mode: SIMV/PC/PS FiO2 (%):  [35 %] 35 % Set Rate:  [12 bmp] 12 bmp Vt Set:  [420 mL] 420 mL PEEP:  [5 cmH20] 5 cmH20 Pressure Support:  [10 cmH20] 10 cmH20 Plateau Pressure:  [15 cmH20] 15 cmH20   Intake/Output Summary (Last 24 hours) at 09/11/2020 0815 Last data filed at 09/10/2020 1600 Gross per 24 hour  Intake 140 ml  Output 1150 ml  Net -1010 ml   Filed Weights   09/08/20 0447 09/09/20 0500 09/10/20 0500  Weight: 109.4 kg 109.1 kg 109.7 kg    Examination: General: Adult female, sitiing up in chair tracheostomy site clean.   NAD HEENT: MM pink/moist, anicteric  Neuro: Alert & oriented  CV: s1s2 RRR, NSR on monitor, no r/m/g Pulm: Regular, non labored on trache collar, decreased bs bilaterally  GI: soft, rounded, bs x 4 Skin: known DTI on buttocks, no rashes/lesions noted Extremities: warm/dry, pulses + 2 R/P, no edema noted  Resolved Hospital Problem list   - Off of airborne/ contact precautions - Elevated Liver Enzymes  Assessment & Plan:   Acute hypoxic respiratory failure secondary to COVID-19 pneumonia and ARDS Morbid Obesity, possible OSA  S/p course of remdesevir, azithromycin & ceftriaxone. Off airborne precautions. Tracheostomy placed 10/8. Cefepime started 10/11 for suspected HCAP-tracheal aspriate +pseudomonas - Ventilator settings: trache collar only  -  trazedone QHS as a sleep aide, discontinued seroquel-d/c xanax and morphine - 09/11/20 - was offered sertraline 50 mg QD- patietn refused - 10/15- will d/c   -  continue vitamin C & zinc - PT/OT following, mobilize as tolerated- OOB to chair daily as tolerated - intermittent CXR, ABG PRN  Leukocytosis- due to possible LLL pneumonia WBC 15.2 - 10/10, low grade fevers, PCT 0.46~ 0.42~ 0.20 CXR 10/11 shows worsening aeration in LLL. PICC & foley removed- 10/11  - Continue Cefepime, tracheal aspirate positive for pseudomonas - monitor WBC/ fever curve - daily CBC  Troponin elevation due to demand ischemia Hypertrophic cardiomyopathy Echo: LVEF 45-50% with severe concentric LVH, diastolic dysfunction  - no pressor requirement - continuous cardiac monitoring - reduced Lasix to QD, continue as tolerated  Mild acute renal failure -resolved Hypokalemia -resolved Hypernatremia -resolved  - Strict I/O's: alert provider if UOP < 0.5 mL/kg/hr - Daily BMP, replace electrolytes PRN - Avoid nephrotoxic agents as able, ensure adequate renal perfusion  Anemia without obvious acute blood loss Last transfusion 9/23, Hgb stable  - Monitor for s/s of bleeding - Daily CBC - Transfuse for Hgb <7  Hyperglycemia   - Q 4 CBG, SSI  Best practice:   Diet: NPO with TF Pain/Anxiety/Delirium protocol (if indicated): morphine Q 4 PRN, seroquel QHS VAP  protocol (if indicated): established DVT prophylaxis: Heparin SQ & SCDs GI prophylaxis: Pepcid Glucose control: Q 4 with SSI, target range 140-180 Mobility: Bedrest Code Status: FULL Family Communication: Patient updated bedside- 10/14 Disposition: ICU  Labs   CBC: Recent Labs  Lab 09/07/20 0526 09/08/20 0429 09/09/20 0347 09/10/20 0418 09/11/20 0606  WBC 13.7* 10.3 8.2 7.1 7.9  NEUTROABS 11.6* 8.3* 6.4 5.4 6.2  HGB 9.3* 9.5* 9.6* 10.1* 9.7*  HCT 30.7* 31.4* 32.4* 33.1* 32.3*  MCV 81.2 81.6 82.9 82.1 82.4  PLT 248 269 303 325 161    Basic Metabolic Panel: Recent Labs  Lab 09/07/20 0526 09/08/20 0429 09/09/20 0347 09/10/20 0418 09/11/20 0606  NA 141 143 143 145 142  K 3.7 3.1* 3.0*  3.5 4.0  CL 102 103 104 107 108  CO2 _0 GLUCOSE 115* 121* 138* 144* 141*  BUN 26* 26* 27* 27* 26*  CREATININE 0.94 0.94 0.83 0.75 0.79  CALCIUM 9.3 9.5 9.4 9.3 9.7  MG 2.5* 2.4 2.4 2.4 2.4  PHOS 3.9 4.1 3.3 2.8 2.8   GFR: Estimated Creatinine Clearance: 118.7 mL/min (by C-G formula based on SCr of 0.79 mg/dL). Recent Labs  Lab 09/07/20 0526 09/07/20 0526 09/08/20 0429 09/09/20 0347 09/10/20 0418 09/11/20 0606  PROCALCITON 0.46  --  0.42 0.20  --   --   WBC 13.7*   < > 10.3 8.2 7.1 7.9   < > = values in this interval not displayed.    Liver Function Tests: No results for input(s): AST, ALT, ALKPHOS, BILITOT, PROT, ALBUMIN in the last 168 hours. No results for input(s): LIPASE, AMYLASE in the last 168 hours. No results for input(s): AMMONIA in the last 168 hours.  ABG    Component Value Date/Time   PHART 7.47 (H) 08/31/2020 0800   PCO2ART 53 (H) 08/31/2020 0800   PO2ART 61 (L) 08/31/2020 0800   HCO3 38.6 (H) 08/31/2020 0800   O2SAT 92.5 08/31/2020 0800     Coagulation Profile: No results for input(s): INR, PROTIME in the last 168 hours.  Cardiac Enzymes: No results for input(s): CKTOTAL, CKMB, CKMBINDEX, TROPONINI in the last 168 hours.  HbA1C: No results found for: HGBA1C  CBG: Recent Labs  Lab 09/10/20 1542 09/10/20 2102 09/11/20 0031 09/11/20 0409 09/11/20 0731  GLUCAP 118* 115* 126* 141* 131*   Critical care provider statement:    Critical care time (minutes):  33   Critical care time was exclusive of:  Separately billable procedures and  treating other patients   Critical care was necessary to treat or prevent imminent or  life-threatening deterioration of the following conditions:  tracheostomy status, acute hypoxemic respiratory failure, severe ARDS, COVID19, severe morbid obesity, multiple comorbid conditions.    Critical care was time spent personally by me on the following  activities:  Development of treatment plan with patient  or surrogate,  discussions with consultants, evaluation of patient's response to  treatment, examination of patient, obtaining history from patient or  surrogate, ordering and performing treatments and interventions, ordering  and review of laboratory studies and re-evaluation of patient's condition   I assumed direction of critical care for this patient from another  provider in my specialty: no      Ottie Glazier, M.D.  Pulmonary & Commerce

## 2020-09-11 NOTE — Progress Notes (Signed)
Inpatient Rehab Admissions Coordinator:  Received consult.  Pt currently on trach collar with no PMV.  Attempted to call pt's spouse on the two numbers provided in chart.  One number is not allowing calls.  Left message on the other number.   Gayland Curry, [place], [place] Admissions Coordinator [phone]

## 2020-09-11 NOTE — Progress Notes (Signed)
Trach downsized per MD order to 6XLTP cuffed. Small amount of light blood in secretions. Pt tolerated procedure well.

## 2020-09-11 NOTE — Progress Notes (Signed)
PHARMACY CONSULT NOTE - FOLLOW UP  Pharmacy Consult for Electrolyte Monitoring and Replacement   Recent Labs: Potassium (mmol/L)  Date Value  09/11/2020 4.0  03/03/2014 3.9   Magnesium (mg/dL)  Date Value  09/11/2020 2.4   Calcium (mg/dL)  Date Value  09/11/2020 9.7   Calcium, Total (mg/dL)  Date Value  03/03/2014 9.3   Albumin (g/dL)  Date Value  08/25/2020 2.3 (L)  03/03/2014 4.2   Phosphorus (mg/dL)  Date Value  09/11/2020 2.8   Sodium (mmol/L)  Date Value  09/11/2020 142  03/03/2014 140     Assessment: 44 year old female admitted with COVID, intubated and sedated in the ICU. Patient is now s/p trach placement. Remains on tube feeds at this time. Pharmacy consult for electrolyte replacement.  Lasix, tube feeds and free water discontinued 10/15.  Goal of Therapy:  Electrolytes WNL  Plan:  No replacement indicated. Follow up electrolytes with morning labs.  Tawnya Crook ,PharmD Clinical Pharmacist 09/11/2020 1:39 PM

## 2020-09-12 LAB — CBC WITH DIFFERENTIAL/PLATELET
Abs Immature Granulocytes: 0.19 10*3/uL — ABNORMAL HIGH (ref 0.00–0.07)
Basophils Absolute: 0.1 10*3/uL (ref 0.0–0.1)
Basophils Relative: 1 %
Eosinophils Absolute: 0.1 10*3/uL (ref 0.0–0.5)
Eosinophils Relative: 1 %
HCT: 32.7 % — ABNORMAL LOW (ref 36.0–46.0)
Hemoglobin: 10 g/dL — ABNORMAL LOW (ref 12.0–15.0)
Immature Granulocytes: 2 %
Lymphocytes Relative: 11 %
Lymphs Abs: 0.9 10*3/uL (ref 0.7–4.0)
MCH: 24.9 pg — ABNORMAL LOW (ref 26.0–34.0)
MCHC: 30.6 g/dL (ref 30.0–36.0)
MCV: 81.3 fL (ref 80.0–100.0)
Monocytes Absolute: 0.5 10*3/uL (ref 0.1–1.0)
Monocytes Relative: 5 %
Neutro Abs: 6.8 10*3/uL (ref 1.7–7.7)
Neutrophils Relative %: 80 %
Platelets: 347 10*3/uL (ref 150–400)
RBC: 4.02 MIL/uL (ref 3.87–5.11)
RDW: 25.2 % — ABNORMAL HIGH (ref 11.5–15.5)
WBC: 8.5 10*3/uL (ref 4.0–10.5)
nRBC: 0 % (ref 0.0–0.2)

## 2020-09-12 LAB — BASIC METABOLIC PANEL
Anion gap: 11 (ref 5–15)
BUN: 21 mg/dL — ABNORMAL HIGH (ref 6–20)
CO2: 24 mmol/L (ref 22–32)
Calcium: 9.7 mg/dL (ref 8.9–10.3)
Chloride: 105 mmol/L (ref 98–111)
Creatinine, Ser: 0.68 mg/dL (ref 0.44–1.00)
GFR, Estimated: >60 mL/min (ref >60)
Glucose, Bld: 129 mg/dL — ABNORMAL HIGH (ref 70–99)
Potassium: 4 mmol/L (ref 3.5–5.1)
Sodium: 140 mmol/L (ref 135–145)

## 2020-09-12 LAB — MAGNESIUM: Magnesium: 2.4 mg/dL (ref 1.7–2.4)

## 2020-09-12 LAB — PHOSPHORUS: Phosphorus: 2.7 mg/dL (ref 2.5–4.6)

## 2020-09-12 LAB — GLUCOSE, CAPILLARY
Glucose-Capillary: 120 mg/dL — ABNORMAL HIGH (ref 70–99)
Glucose-Capillary: 122 mg/dL — ABNORMAL HIGH (ref 70–99)
Glucose-Capillary: 132 mg/dL — ABNORMAL HIGH (ref 70–99)
Glucose-Capillary: 133 mg/dL — ABNORMAL HIGH (ref 70–99)
Glucose-Capillary: 135 mg/dL — ABNORMAL HIGH (ref 70–99)
Glucose-Capillary: 139 mg/dL — ABNORMAL HIGH (ref 70–99)

## 2020-09-12 MED ORDER — PANTOPRAZOLE SODIUM 40 MG PO PACK
40.0000 mg | PACK | Freq: Two times a day (BID) | ORAL | Status: DC
  Administered 2020-09-12 – 2020-09-14 (×4): 40 mg
  Filled 2020-09-12 (×6): qty 20

## 2020-09-12 MED ORDER — PROSOURCE TF PO LIQD
45.0000 mL | Freq: Three times a day (TID) | ORAL | Status: DC
  Administered 2020-09-12 – 2020-09-14 (×7): 45 mL
  Filled 2020-09-12 (×11): qty 45

## 2020-09-12 MED ORDER — ORAL CARE MOUTH RINSE
15.0000 mL | Freq: Two times a day (BID) | OROMUCOSAL | Status: DC
  Administered 2020-09-12 – 2020-09-29 (×31): 15 mL via OROMUCOSAL

## 2020-09-12 MED ORDER — VITAL 1.5 CAL PO LIQD
1000.0000 mL | ORAL | Status: DC
  Administered 2020-09-13: 1000 mL

## 2020-09-12 MED ORDER — CHLORHEXIDINE GLUCONATE 0.12 % MT SOLN
15.0000 mL | Freq: Two times a day (BID) | OROMUCOSAL | Status: DC
  Administered 2020-09-12 – 2020-09-15 (×7): 15 mL via OROMUCOSAL
  Filled 2020-09-12 (×3): qty 15

## 2020-09-12 MED ORDER — FLUCONAZOLE 100 MG PO TABS
100.0000 mg | ORAL_TABLET | ORAL | Status: DC
  Administered 2020-09-12 – 2020-09-14 (×3): 100 mg
  Filled 2020-09-12 (×5): qty 1

## 2020-09-12 MED ORDER — TRAZODONE HCL 50 MG PO TABS
100.0000 mg | ORAL_TABLET | Freq: Every day | ORAL | Status: DC
  Administered 2020-09-12 – 2020-09-13 (×2): 100 mg
  Filled 2020-09-12 (×2): qty 1

## 2020-09-12 NOTE — Plan of Care (Signed)
Patient Kathy Vega with minimal assist on trach collar tolerating well.

## 2020-09-12 NOTE — Progress Notes (Addendum)
Brief Nutrition Note  RD received page on on-call pager from RN regarding pt complaining of unspecified abdominal discomfort related to tube feeds.  Upon review of MAR, noted that tube feeding orders had been discontinued on 10/15. Suspect orders were discontinued related to SLP evaluation. Spoke with MD who agreed with restarting tube feeds if pt unable to tolerate an oral diet. Spoke with SLP who states that pt will be reevaluated for ability to start an oral diet on Monday. Pt remains NPO at this time. Called RN back and will reorder tube feeding at a lower rate. RN to page RD if tolerance issues continue.  Will order: - Start Vital 1.5 @ 40 ml/hr via small-bore NG tube - Advance tube feeds by 10 ml/hr q 12 hours back to goal rate of 70 ml/hr - ProSource TF 45 ml TID per tube  At goal, this regimen will provide 2640 kcal and 146 grams of protein.   Kathy Face, MS, RD, LDN Inpatient Clinical Dietitian Please see AMiON for contact information.

## 2020-09-12 NOTE — Progress Notes (Signed)
Physical Therapy Treatment Patient Details Name: Kathy Vega MRN: [id number] DOB: 1976/07/25 Today's Date: 09/12/2020    History of Present Illness Pt is a 44 y.o. female presenting to hospital 9/16 with SOB and confusion; initial O2 sats in 20's; pt COVID (+) 6 days prior.  Mechanically intubated 9/17.  S/p trach 10/8.  Pt admitted with acute hypoxic respiratory failure secondary COVID-19 PNA and ARDS, possible OSA with upper airway swelling s/p trach, and severe acute hypoxic and hypercapnic respiratory failure.  PMH includes anemia, thyroid disease, h/o leg surgery.    PT Comments    Pt was seated in recliner upon arriving. She is alert and oriented. Able to answer questions via writing responses. Therapist answered all questions she had about care. She is most interesting in asking about feeding/eating. Discussed positive progress with her and what to expect moving forward. She writes understanding and is very cooperative throughout sessio. She was on 8 L o2 trach collar throughout session. Min assist to stand and ambulate with RW 200 ft. CGA during ambulation for safety. +1 assist only throughout session. Pt's HR elevates to 130s with ambulation however even with standing rest quickly resolves to 120s. She overall tolerated session well. Progressing well towards all PT goals. Recommend CIR at DC to assist pt to returning to PLOF. Pt was completely independent prior to admission.      Follow Up Recommendations  CIR     Equipment Recommendations  Rolling walker with 5" wheels;3in1 (PT)    Recommendations for Other Services       Precautions / Restrictions Precautions Precautions: Fall Precaution Comments: Trach (mech vent vs trach collar); HOB >30 degrees; Dobhoff Restrictions Weight Bearing Restrictions: No    Mobility  Bed Mobility      General bed mobility comments: pt was seated in recliner upon arriving. post session was seated on [facility] with RN in  room.  Transfers Overall transfer level: Needs assistance Equipment used: Rolling walker (2 wheeled) Transfers: Sit to/from Stand Sit to Stand: Min assist         General transfer comment: Min assist + vcs for proper technique and handplacement with STS. demonstrated goood eccentric controlled lowering to sit  Ambulation/Gait Ambulation/Gait assistance: Min guard (+1 only throughout session) Gait Distance (Feet): 200 Feet Assistive device: Rolling walker (2 wheeled) Gait Pattern/deviations: WFL(Within Functional Limits);Narrow base of support Gait velocity: decreased   General Gait Details: pt was easily able to ambulate 200 ft with CGA only. vcs for incraesed BOS only. ambulated on 8 L o2 trach collar with sao2 > 94% throughout. HR elevated to upper 130s buts mostly in 120s. 98bpm at rest.        Balance Overall balance assessment: Needs assistance Sitting-balance support: No upper extremity supported;Feet supported Sitting balance-Leahy Scale: Good Sitting balance - Comments: steady sitting reaching within BOS   Standing balance support: Bilateral upper extremity supported;During functional activity Standing balance-Leahy Scale: Fair Standing balance comment: narrow BOS with dynamic standing activities               Cognition Arousal/Alertness: Awake/alert Behavior During Therapy: WFL for tasks assessed/performed Overall Cognitive Status: Within Functional Limits for tasks assessed          General Comments: pt presents alert and oriented by answering questions by writing. did not even attempt to mouth words. Encouraged to do so              Pertinent Vitals/Pain Pain Assessment: No/denies pain Faces Pain Scale: No hurt Pain  Descriptors / Indicators: Sore (throat) Pain Intervention(s): Limited activity within patient's tolerance;Monitored during session;Repositioned           PT Goals (current goals can now be found in the care plan section) Acute  Rehab PT Goals Patient Stated Goal: go home Progress towards PT goals: Progressing toward goals    Frequency    7X/week      PT Plan Current plan remains appropriate       AM-PAC PT "6 Clicks" Mobility   Outcome Measure  Help needed turning from your back to your side while in a flat bed without using bedrails?: A Little Help needed moving from lying on your back to sitting on the side of a flat bed without using bedrails?: A Little Help needed moving to and from a bed to a chair (including a wheelchair)?: A Little Help needed standing up from a chair using your arms (e.g., wheelchair or bedside chair)?: A Little Help needed to walk in hospital room?: A Little Help needed climbing 3-5 steps with a railing? : A Lot 6 Click Score: 17    End of Session Equipment Utilized During Treatment: Oxygen;Gait belt (8L o2 trach collar throughout) Activity Tolerance: Patient tolerated treatment well Patient left: in chair;with call bell/phone within reach;with nursing/sitter in room Nurse Communication: Mobility status;Precautions;Other (comment) PT Visit Diagnosis: Other abnormalities of gait and mobility (R26.89);Muscle weakness (generalized) (M62.81);Difficulty in walking, not elsewhere classified (R26.2)     Time: 6384-5364 PT Time Calculation (min) (ACUTE ONLY): 45 min  Charges:  $Gait Training: 23-37 mins $Therapeutic Activity: 8-22 mins                     Julaine Fusi PTA 09/12/20, 11:07 AM

## 2020-09-12 NOTE — Progress Notes (Signed)
PHARMACY CONSULT NOTE - FOLLOW UP  Pharmacy Consult for Electrolyte Monitoring and Replacement   Recent Labs: Potassium (mmol/L)  Date Value  09/12/2020 4.0  03/03/2014 3.9   Magnesium (mg/dL)  Date Value  09/12/2020 2.4   Calcium (mg/dL)  Date Value  09/12/2020 9.7   Calcium, Total (mg/dL)  Date Value  03/03/2014 9.3   Albumin (g/dL)  Date Value  08/25/2020 2.3 (L)  03/03/2014 4.2   Phosphorus (mg/dL)  Date Value  09/12/2020 2.7   Sodium (mmol/L)  Date Value  09/12/2020 140  03/03/2014 140     Assessment: 44 year old female admitted with COVID, intubated and sedated in the ICU. Patient is now s/p trach placement. Remains on tube feeds at this time. Pharmacy consult for electrolyte replacement.  Lasix, tube feeds and free water discontinued 10/15.  Goal of Therapy:  Electrolytes WNL  Plan:  No replacement indicated at this time. Follow up electrolytes with morning labs.  Noralee Space ,PharmD Clinical Pharmacist 09/12/2020 8:16 AM

## 2020-09-12 NOTE — Progress Notes (Signed)
Pharmacy Antibiotic Note  Kathy Vega is a 44 y.o. female admitted on 08/13/2020 with COVID-19 PNA.  Pharmacy has been consulted for cefepime dosing. Pt s/p trach 10/8. NGT placed 10/11. CXR shows worsened left lower lobe aeration with an elevated procalcitonin and WBC along with low-grade fevers  Trach aspirate 10/11-RARE PSEUDOMONAS AERUGINOSA-pan sensitive  Plan:   Day 6- cefepime 2 grams IV every 8 hours   Height: 5' 9.13" (175.6 cm) Weight: 109.7 kg (241 lb 13.5 oz) IBW/kg (Calculated) : 66.51  Temp (24hrs), Avg:98.4 F (36.9 C), Min:97.9 F (36.6 C), Max:99.1 F (37.3 C)  Recent Labs  Lab 09/08/20 0429 09/09/20 0347 09/10/20 0418 09/11/20 0606 09/12/20 0446  WBC 10.3 8.2 7.1 7.9 8.5  CREATININE 0.94 0.83 0.75 0.79 0.68    Estimated Creatinine Clearance: 118.7 mL/min (by C-G formula based on SCr of 0.68 mg/dL).    No Known Allergies  Antimicrobials this admission: cefepime 10/11 >>   Microbiology results: 9/16 BCx: NG final 10/11 Sputum: pending  9/16 MRSA PCR: negative 10/11 Trach aspirate: rare pseudomonas aerug.  Pan sensitive  Thank you for allowing pharmacy to be a part of this patient's care.  Tyran Huser A 09/12/2020 8:18 AM

## 2020-09-12 NOTE — Progress Notes (Signed)
NAME:  Kathy Vega, MRN:  [id number], DOB:  Jul 05, 1976, LOS: 46 ADMISSION DATE:  08/13/2020, CONSULTATION DATE:  08/13/2020  Brief History   44 yo unvaccinated female admitted with COVID-19 pneumonia & ARDS requiring intubation and mechanical ventilation.  Past Medical History  Thyroid disease Anemia  Significant Hospital Events   09/16: Pt admitted to ICU requiring Heated HFNC and NRB, however remained in the ER pending ICU bed availability  09/16: CT Head revealed no acute intracranial abnormality 09/17: Pt with worsening hypoxia requiring mechanical intubation 09/18: [facility] refused to accept pt for ECMO 09/21: Attempted to transfer pt to [facility] and [facility] hospital for possible assessment for ECMO, however both hospitals DO NOT have bed availability and are at capacity and pt may or may NOT be an ECMO candidate at this time  09/23: Pt unproned  09/24:  Resumed proning protocol 09/25: Resumed proning protocol  09/26: Pt unproned 9/27-  No onvernight events, patient remains in ARDS on elevated settings. 08/25/20- patient is on maximal settings with sedation. She is on day 11 of her illness with little improvement.  Ive attempted to reach husband via cell no answer, ive successfully found her daughter and spoke with her regarding care plan and she is agreeable but requested for me to speak with [name] and has provided me with secondary phone number [phone] but again only voice mail was reachable.  Have left voicemail and will discuss trache with family when they are available.  08/26/20- patient without overnight events, ive called POA husband again today but was unable to reach him and left voice mail.  08/27/20- patient critically ill with overall poor prognosis, she had desaturation event today and was difficult to improve requiring multiple changes on ventilator by RT.  08/28/20- Patient is weaned to 60% FiO2 on MV.  Have called Garlan Fillers today and updated him. We  discussed trache and he is agreeable.  08/29/20- Spoke with husband today, answered questions ,updated on care plan,  he is ready to speak with ENT regarding trache palcement 08/30/20- patient had SBT today she was awake and followed verbal communication but after 2 hours had severly labored breathing and thick high volume secretions.  I have met with husband Garlan Fillers and he was able to see patient at bedside we discussed trache. I have updated Dr Tami Ribas regarding trache plan.  08/31/20 - no airleak will need trach, discussed with Dr.Vaught. 09/01/20-Still no air leak. Patient has trach scheduled for Friday of this week and thus no need for steroids at this time 09/02/20- no air leak, continuing plan of care, no other concerns 09/03/20- very minimal air leak, continue with trach 10/8 09/04/20- Trach placed 09/07/20- Patient OOB with lift, weaned to 5/5, PICC & foley removed 09/08/20- tolerated 1 hr TC, OOB to chair without lift, pivoted on her own strength 09/09/20- patient not sleeping well O/N, bad dreams and overall feeling "down", tolerated TC 2 hours 09/11/20- patient is participating with PT while on trache collar and did well. Sutures removed. Will optimize for Stepdown status today.  FiO2 - 35%.  Pseudomonas on tracheal aspirate - plan for 7 d cefepime.  Trache size 8 switch to cuffed 6 today to allow for speech.  09/12/20- Patient stable, family came for visit. Continues to have clinical signs of improvement. Continuing current care plan. TOC consultation for post trache placement.    Consults:  Surgery  Procedures:  ETT 9/17 >> 10/8 PICC 9/17 >> 10/11 Tracheostomy 10/8 >>  Significant Diagnostic Tests:    Micro Data:  MRSA PCR 9/16 >> negative [facility] 9/16 >> negative Tracheal aspirate 10/11 >> Pseudomonas aeruginosa  Antimicrobials:  Metronidazole 9/24 >> 9/29 Cefepime 10/11 >>  Interim history/subjective:  Patient sitting up in bed, asking for water to drink and wanting  to sit up so she can look out the window all day today.  Discussed with RN team. When asked if patient was feeling anxious/restless or down/depressed, she gestured a thumbs down motion. Patient walked out of the room with walker and staff assist  Overnight- care RN stated only slept 3 hours Labs/ Imaging personally reviewed Net: + 795 mL (- 5 L since admit) Na+/ K+: 145/ 3.5 BUN/Cr.: 27/ 0.75 Hgb: 10.1 TMAX/ WBC: 37.3/ 7.1  Objective   Blood pressure (!) 146/94, pulse (!) 102, temperature 98.2 F (36.8 C), temperature source Axillary, resp. rate 18, height 5' 9.13" (1.756 m), weight 109.7 kg, SpO2 97 %.    FiO2 (%):  [25 %-35 %] 28 %   Intake/Output Summary (Last 24 hours) at 09/12/2020 1559 Last data filed at 09/12/2020 1500 Gross per 24 hour  Intake 1954.29 ml  Output 550 ml  Net 1404.29 ml   Filed Weights   09/08/20 0447 09/09/20 0500 09/10/20 0500  Weight: 109.4 kg 109.1 kg 109.7 kg    Examination: General: Adult female, sitiing up in chair tracheostomy site clean.   NAD HEENT: MM pink/moist, anicteric +trache Neuro: Alert & oriented  CV: s1s2 RRR, NSR on monitor, no r/m/g Pulm: Regular, non labored on trache collar, decreased bs bilaterally  GI: soft, rounded, bs x 4 Skin: known DTI on buttocks, no rashes/lesions noted Extremities: warm/dry, pulses + 2 R/P, no edema noted  Resolved Hospital Problem list   - Off of airborne/ contact precautions - Elevated Liver Enzymes  Assessment & Plan:   Acute hypoxic respiratory failure secondary to COVID-19 pneumonia and ARDS Morbid Obesity, possible OSA  S/p course of remdesevir, azithromycin & ceftriaxone. Off airborne precautions. Tracheostomy placed 10/8. Cefepime started 10/11 for suspected HCAP-tracheal aspriate +pseudomonas - Ventilator settings: trache collar only  -  trazedone QHS as a sleep aide, discontinued seroquel-d/c xanax and morphine - 09/11/20 - was offered sertraline 50 mg QD- patietn refused - 10/15-  will d/c   - continue vitamin C & zinc - PT/OT following, mobilize as tolerated- OOB to chair daily as tolerated - intermittent CXR, ABG PRN  Leukocytosis- due to possible LLL pneumonia WBC -within reference range 10/16 CXR 10/11 shows worsening aeration in LLL. PICC & foley removed- 10/11 - Continue Cefepime, tracheal aspirate positive for pseudomonas - monitor WBC/ fever curve - daily CBC  Troponin elevation due to demand ischemia Hypertrophic cardiomyopathy Echo: LVEF 45-50% with severe concentric LVH, diastolic dysfunction  - no pressor requirement - continuous cardiac monitoring - reduced Lasix to QD, continue as tolerated  Mild acute renal failure -resolved Hypokalemia -resolved Hypernatremia -resolved  - Strict I/O's: alert provider if UOP < 0.5 mL/kg/hr - Daily BMP, replace electrolytes PRN - Avoid nephrotoxic agents as able, ensure adequate renal perfusion  Anemia without obvious acute blood loss Last transfusion 9/23, Hgb stable  - Monitor for s/s of bleeding - Daily CBC - Transfuse for Hgb <7  Hyperglycemia   - Q 4 CBG, SSI  Best practice:   Diet: NPO with TF Pain/Anxiety/Delirium protocol (if indicated): morphine Q 4 PRN, seroquel QHS VAP protocol (if indicated): established DVT prophylaxis: Heparin SQ & SCDs GI prophylaxis: Pepcid Glucose control: Q 4  with SSI, target range 140-180 Mobility: Bedrest Code Status: FULL Family Communication: Patient updated bedside- 10/14 Disposition: ICU  Labs   CBC: Recent Labs  Lab 09/08/20 0429 09/09/20 0347 09/10/20 0418 09/11/20 0606 09/12/20 0446  WBC 10.3 8.2 7.1 7.9 8.5  NEUTROABS 8.3* 6.4 5.4 6.2 6.8  HGB 9.5* 9.6* 10.1* 9.7* 10.0*  HCT 31.4* 32.4* 33.1* 32.3* 32.7*  MCV 81.6 82.9 82.1 82.4 81.3  PLT 269 303 325 319 062    Basic Metabolic Panel: Recent Labs  Lab 09/08/20 0429 09/09/20 0347 09/10/20 0418 09/11/20 0606 09/12/20 0446  NA 143 143 145 142 140  K 3.1* 3.0* 3.5 4.0 4.0  CL 103  104 107 108 105  CO2 26 28 25 24 24   GLUCOSE 121* 138* 144* 141* 129*  BUN 26* 27* 27* 26* 21*  CREATININE 0.94 0.83 0.75 0.79 0.68  CALCIUM 9.5 9.4 9.3 9.7 9.7  MG 2.4 2.4 2.4 2.4 2.4  PHOS 4.1 3.3 2.8 2.8 2.7   GFR: Estimated Creatinine Clearance: 118.7 mL/min (by C-G formula based on SCr of 0.68 mg/dL). Recent Labs  Lab 09/07/20 0526 09/07/20 0526 09/08/20 0429 09/08/20 0429 09/09/20 0347 09/10/20 0418 09/11/20 0606 09/12/20 0446  PROCALCITON 0.46  --  0.42  --  0.20  --   --   --   WBC 13.7*   < > 10.3   < > 8.2 7.1 7.9 8.5   < > = values in this interval not displayed.    Liver Function Tests: No results for input(s): AST, ALT, ALKPHOS, BILITOT, PROT, ALBUMIN in the last 168 hours. No results for input(s): LIPASE, AMYLASE in the last 168 hours. No results for input(s): AMMONIA in the last 168 hours.  ABG    Component Value Date/Time   PHART 7.47 (H) 08/31/2020 0800   PCO2ART 53 (H) 08/31/2020 0800   PO2ART 61 (L) 08/31/2020 0800   HCO3 38.6 (H) 08/31/2020 0800   O2SAT 92.5 08/31/2020 0800     Coagulation Profile: No results for input(s): INR, PROTIME in the last 168 hours.  Cardiac Enzymes: No results for input(s): CKTOTAL, CKMB, CKMBINDEX, TROPONINI in the last 168 hours.  HbA1C: No results found for: HGBA1C  CBG: Recent Labs  Lab 09/11/20 2007 09/11/20 2304 09/12/20 0402 09/12/20 0759 09/12/20 1122  GLUCAP 125* 126* 135* 122* 133*     Critical care provider statement:    Critical care time (minutes):  33   Critical care time was exclusive of:  Separately billable procedures and  treating other patients   Critical care was necessary to treat or prevent imminent or  life-threatening deterioration of the following conditions:  tracheostomy status, acute hypoxemic respiratory failure, severe ARDS, COVID19, severe morbid obesity, multiple comorbid conditions.    Critical care was time spent personally by me on the following  activities:   Development of treatment plan with patient or surrogate,  discussions with consultants, evaluation of patient's response to  treatment, examination of patient, obtaining history from patient or  surrogate, ordering and performing treatments and interventions, ordering  and review of laboratory studies and re-evaluation of patient's condition   I assumed direction of critical care for this patient from another  provider in my specialty: no      Ottie Glazier, M.D.  [facility]

## 2020-09-13 LAB — BASIC METABOLIC PANEL
Anion gap: 10 (ref 5–15)
BUN: 21 mg/dL — ABNORMAL HIGH (ref 6–20)
CO2: 26 mmol/L (ref 22–32)
Calcium: 9.7 mg/dL (ref 8.9–10.3)
Chloride: 105 mmol/L (ref 98–111)
Creatinine, Ser: 0.66 mg/dL (ref 0.44–1.00)
GFR, Estimated: >60 mL/min (ref >60)
Glucose, Bld: 111 mg/dL — ABNORMAL HIGH (ref 70–99)
Potassium: 4.1 mmol/L (ref 3.5–5.1)
Sodium: 141 mmol/L (ref 135–145)

## 2020-09-13 LAB — GLUCOSE, CAPILLARY
Glucose-Capillary: 107 mg/dL — ABNORMAL HIGH (ref 70–99)
Glucose-Capillary: 127 mg/dL — ABNORMAL HIGH (ref 70–99)
Glucose-Capillary: 138 mg/dL — ABNORMAL HIGH (ref 70–99)
Glucose-Capillary: 126 mg/dL — ABNORMAL HIGH (ref 70–99)
Glucose-Capillary: 116 mg/dL — ABNORMAL HIGH (ref 70–99)
Glucose-Capillary: 137 mg/dL — ABNORMAL HIGH (ref 70–99)

## 2020-09-13 LAB — CBC WITH DIFFERENTIAL/PLATELET
Abs Immature Granulocytes: 0.22 10*3/uL — ABNORMAL HIGH (ref 0.00–0.07)
Basophils Absolute: 0.1 10*3/uL (ref 0.0–0.1)
Basophils Relative: 1 %
Eosinophils Absolute: 0.1 10*3/uL (ref 0.0–0.5)
Eosinophils Relative: 1 %
HCT: 32 % — ABNORMAL LOW (ref 36.0–46.0)
Hemoglobin: 10 g/dL — ABNORMAL LOW (ref 12.0–15.0)
Immature Granulocytes: 2 %
Lymphocytes Relative: 12 %
Lymphs Abs: 1.2 10*3/uL (ref 0.7–4.0)
MCH: 25.3 pg — ABNORMAL LOW (ref 26.0–34.0)
MCHC: 31.3 g/dL (ref 30.0–36.0)
MCV: 80.8 fL (ref 80.0–100.0)
Monocytes Absolute: 0.5 10*3/uL (ref 0.1–1.0)
Monocytes Relative: 6 %
Neutro Abs: 7.2 10*3/uL (ref 1.7–7.7)
Neutrophils Relative %: 78 %
Platelets: 351 10*3/uL (ref 150–400)
RBC: 3.96 MIL/uL (ref 3.87–5.11)
RDW: 25.5 % — ABNORMAL HIGH (ref 11.5–15.5)
WBC: 9.3 10*3/uL (ref 4.0–10.5)
nRBC: 0 % (ref 0.0–0.2)

## 2020-09-13 LAB — MAGNESIUM: Magnesium: 2.4 mg/dL (ref 1.7–2.4)

## 2020-09-13 LAB — PHOSPHORUS: Phosphorus: 3.2 mg/dL (ref 2.5–4.6)

## 2020-09-13 NOTE — Progress Notes (Signed)
Pharmacy Antibiotic Note  Kathy Vega is a 44 y.o. female admitted on 08/13/2020 with COVID-19 PNA.  Pharmacy has been consulted for cefepime dosing. Pt s/p trach 10/8. NGT placed 10/11. CXR shows worsened left lower lobe aeration with an elevated procalcitonin and WBC along with low-grade fevers  Trach on 09/04/20 Trach aspirate 10/11-RARE PSEUDOMONAS AERUGINOSA-pan sensitive  Plan:   Day 7 - cefepime 2 grams IV every 8 hours  (stop date 10/18 after 21 doses)   Height: 5' 9.13" (175.6 cm) Weight: 109.7 kg (241 lb 13.5 oz) IBW/kg (Calculated) : 66.51  Temp (24hrs), Avg:98.5 F (36.9 C), Min:98.2 F (36.8 C), Max:99.1 F (37.3 C)  Recent Labs  Lab 09/09/20 0347 09/10/20 0418 09/11/20 0606 09/12/20 0446 09/13/20 0523  WBC 8.2 7.1 7.9 8.5 9.3  CREATININE 0.83 0.75 0.79 0.68 0.66    Estimated Creatinine Clearance: 118.7 mL/min (by C-G formula based on SCr of 0.66 mg/dL).    No Known Allergies  Antimicrobials this admission: cefepime 10/11 (evening) >>   Microbiology results: 9/16 BCx: NG final 10/11 Sputum: pending  9/16 MRSA PCR: negative 10/11 Trach aspirate: rare pseudomonas aerug.  Pan sensitive  Thank you for allowing pharmacy to be a part of this patient's care.  Melony Tenpas A 09/13/2020 11:46 AM

## 2020-09-13 NOTE — Progress Notes (Signed)
Physical Therapy Treatment Patient Details Name: Kathy Vega MRN: [id number] DOB: [birth year] Today's Date: 09/13/2020    History of Present Illness Pt is a 44 y.o. female presenting to hospital 9/16 with SOB and confusion; initial O2 sats in 20's; pt COVID (+) 6 days prior.  Mechanically intubated 9/17.  S/p trach 10/8.  Pt admitted with acute hypoxic respiratory failure secondary COVID-19 PNA and ARDS, possible OSA with upper airway swelling s/p trach, and severe acute hypoxic and hypercapnic respiratory failure.  PMH includes anemia, thyroid disease, h/o leg surgery.    PT Comments    Patient is agreeable to PT and making progress towards meeting functional goals. Patient needs Min A for stand pivot transfer from bed to recliner chair using rolling walker and verbal cues for positioning, hand placement, and safety with transfers. Patient able participate with standing therapeutic exercises with Min guard for safety and rolling walker for UE support. Seated exercises for BLE also performed. Patient declined ambulation today. Noted heart rate does increase to mid 130's with activity and decreased to 117bpm with seated rest break. Recommend to continue PT to maximize independence and address remaining functional limitations. CIR is recommended at discharge.   Follow Up Recommendations  CIR     Equipment Recommendations  Rolling walker with 5" wheels;3in1 (PT)    Recommendations for Other Services       Precautions / Restrictions Precautions Precautions: Fall Precaution Comments: Trach (mech vent vs trach collar); HOB >30 degrees; Dobhoff Restrictions Weight Bearing Restrictions: No    Mobility  Bed Mobility               General bed mobility comments: not addressed as patient sitting up with nursing staff on arrival to room   Transfers Overall transfer level: Needs assistance Equipment used: Rolling walker (2 wheeled) Transfers: Stand Pivot Transfers   Stand pivot  transfers: Min assist       General transfer comment: verbal and tactile cues for safe hand placement and positioning with sitting and standing. lifting assistance required for standing   Ambulation/Gait             General Gait Details: patient declined ambulating this session with no reason specified. patient participated with standing exercises    Stairs             Wheelchair Mobility    Modified Rankin (Stroke Patients Only)       Balance Overall balance assessment: Needs assistance Sitting-balance support: No upper extremity supported;Feet supported Sitting balance-Leahy Scale: Good Sitting balance - Comments: steadying with reaching outside base of support    Standing balance support: Bilateral upper extremity supported;During functional activity Standing balance-Leahy Scale: Fair Standing balance comment: patient relying heavily on rolling walker for support in standing position                             Cognition Arousal/Alertness: Awake/alert Behavior During Therapy: WFL for tasks assessed/performed Overall Cognitive Status: Within Functional Limits for tasks assessed                                 General Comments: patient occasionally using note pad to communicate via writing. patient following all commands without difficulty. patient mouthing and nodding for most communication       Exercises General Exercises - Lower Extremity Long Arc Quad: AAROM;Strengthening;Both;10 reps;Seated Hip ABduction/ADduction: AROM;Strengthening;Both;10 reps;Standing Hip Flexion/Marching:  AROM;Strengthening;Both;10 reps;Standing (also performed x 10 reps BLE in sitting position ) Other Exercises Other Exercises: verbal and visual cues for exercise techniques in standing and sitting     General Comments        Pertinent Vitals/Pain Pain Assessment: No/denies pain    Home Living                      Prior Function             PT Goals (current goals can now be found in the care plan section) Acute Rehab PT Goals Patient Stated Goal: go home PT Goal Formulation: With patient Time For Goal Achievement: 09/21/20 Potential to Achieve Goals: Good Progress towards PT goals: Progressing toward goals    Frequency    7X/week      PT Plan Current plan remains appropriate    Co-evaluation              AM-PAC PT "6 Clicks" Mobility   Outcome Measure  Help needed turning from your back to your side while in a flat bed without using bedrails?: A Little Help needed moving from lying on your back to sitting on the side of a flat bed without using bedrails?: A Little Help needed moving to and from a bed to a chair (including a wheelchair)?: A Little Help needed standing up from a chair using your arms (e.g., wheelchair or bedside chair)?: A Little Help needed to walk in hospital room?: A Little Help needed climbing 3-5 steps with a railing? : A Lot 6 Click Score: 17    End of Session Equipment Utilized During Treatment: Oxygen (5 L02 trach mask ) Activity Tolerance: Patient tolerated treatment well Patient left: in chair;with call bell/phone within reach;with nursing/sitter in room Nurse Communication: Mobility status;Precautions (noted RN of increased heart rate with activity ) PT Visit Diagnosis: Other abnormalities of gait and mobility (R26.89);Muscle weakness (generalized) (M62.81);Difficulty in walking, not elsewhere classified (R26.2)     Time: 3361-2244 PT Time Calculation (min) (ACUTE ONLY): 23 min  Charges:  $Therapeutic Exercise: 23-37 mins                    Kathy Vega, PT, MPT    Percell Locus 09/13/2020, 2:53 PM

## 2020-09-13 NOTE — Progress Notes (Signed)
NAME:  Kathy Vega, MRN:  [id number], DOB:  1976-05-27, LOS: 71 ADMISSION DATE:  08/13/2020, CONSULTATION DATE:  08/13/2020  Brief History   44 yo unvaccinated female admitted with COVID-19 pneumonia & ARDS requiring intubation and mechanical ventilation.  Past Medical History  Thyroid disease Anemia  Significant Hospital Events   09/16: Pt admitted to ICU requiring Heated HFNC and NRB, however remained in the ER pending ICU bed availability  09/16: CT Head revealed no acute intracranial abnormality 09/17: Pt with worsening hypoxia requiring mechanical intubation 09/18: [facility] refused to accept pt for ECMO 09/21: Attempted to transfer [facility] and [facility] for possible assessment for ECMO, however both hospitals DO NOT have bed availability and are at capacity and pt may or may NOT be an ECMO candidate at this time  09/23: Pt unproned  09/24:  Resumed proning protocol 09/25: Resumed proning protocol  09/26: Pt unproned 9/27-  No onvernight events, patient remains in ARDS on elevated settings. 08/25/20- patient is on maximal settings with sedation. She is on day 11 of her illness with little improvement.  Ive attempted to reach husband via cell no answer, ive successfully found her daughter and spoke with her regarding care plan and she is agreeable but requested for me to speak with [name] and has provided me with secondary phone number [phone] but again only voice mail was reachable.  Have left voicemail and will discuss trache with family when they are available.  08/26/20- patient without overnight events, ive called POA husband again today but was unable to reach him and left voice mail.  08/27/20- patient critically ill with overall poor prognosis, she had desaturation event today and was difficult to improve requiring multiple changes on ventilator by RT.  08/28/20- Patient is weaned to 60% FiO2 on MV.  Have called Garlan Fillers today and updated him. We  discussed trache and he is agreeable.  08/29/20- Spoke with husband today, answered questions ,updated on care plan,  he is ready to speak with ENT regarding trache palcement 08/30/20- patient had SBT today she was awake and followed verbal communication but after 2 hours had severly labored breathing and thick high volume secretions.  I have met with husband Garlan Fillers and he was able to see patient at bedside we discussed trache. I have updated Dr Tami Ribas regarding trache plan.  08/31/20 - no airleak will need trach, discussed with Dr.Vaught. 09/01/20-Still no air leak. Patient has trach scheduled for Friday of this week and thus no need for steroids at this time 09/02/20- no air leak, continuing plan of care, no other concerns 09/03/20- very minimal air leak, continue with trach 10/8 09/04/20- Trach placed 09/07/20- Patient OOB with lift, weaned to 5/5, PICC & foley removed 09/08/20- tolerated 1 hr TC, OOB to chair without lift, pivoted on her own strength 09/09/20- patient not sleeping well O/N, bad dreams and overall feeling "down", tolerated TC 2 hours 09/11/20- patient is participating with PT while on trache collar and did well. Sutures removed. Will optimize for Stepdown status today.  FiO2 - 35%.  Pseudomonas on tracheal aspirate - plan for 7 d cefepime.  Trache size 8 switch to cuffed 6 today to allow for speech.  09/12/20- Patient stable, family came for visit. Continues to have clinical signs of improvement. Continuing current care plan. TOC consultation for post trache placement.  09/13/20- patient is improved, plan to optimize for [facility] transfer today. Will page out to [facility] for pick up  this am.    Consults:  Surgery  Procedures:  ETT 9/17 >> 10/8 PICC 9/17 >> 10/11 Tracheostomy 10/8 >>  Significant Diagnostic Tests:    Micro Data:  MRSA PCR 9/16 >> negative [facility] 9/16 >> negative Tracheal aspirate 10/11 >> Pseudomonas aeruginosa  Antimicrobials:  Metronidazole 9/24 >>  9/29 Cefepime 10/11 >>  Interim history/subjective:  Patient sitting up in bed giving thumbs up and smiling this morning. - 09/13/20  Objective   Blood pressure 126/84, pulse (!) 108, temperature 98.6 F (37 C), temperature source Axillary, resp. rate (!) 25, height 5' 9.13" (1.756 m), weight 109.7 kg, SpO2 97 %.    FiO2 (%):  [28 %] 28 %   Intake/Output Summary (Last 24 hours) at 09/13/2020 0825 Last data filed at 09/12/2020 1700 Gross per 24 hour  Intake 300 ml  Output 550 ml  Net -250 ml   Filed Weights   09/08/20 0447 09/09/20 0500 09/10/20 0500  Weight: 109.4 kg 109.1 kg 109.7 kg    Examination: General: Adult female, sitiing up in chair tracheostomy site clean.   NAD HEENT: MM pink/moist, anicteric +trache Neuro: Alert & oriented  CV: s1s2 RRR, NSR on monitor, no r/m/g Pulm: Regular, non labored on trache collar, decreased bs bilaterally  GI: soft, rounded, bs x 4 Skin: known DTI on buttocks, no rashes/lesions noted Extremities: warm/dry, pulses + 2 R/P, no edema noted  Resolved Hospital Problem list   - Off of airborne/ contact precautions - Elevated Liver Enzymes  Assessment & Plan:   Acute hypoxic respiratory failure secondary to COVID-19 pneumonia and ARDS Morbid Obesity, possible OSA  S/p course of remdesevir, azithromycin & ceftriaxone. Off airborne precautions. Tracheostomy placed 10/8. Cefepime started 10/11 for suspected HCAP-tracheal aspriate +pseudomonas - Ventilator settings: trache collar only  -  trazedone QHS as a sleep aide, discontinued seroquel-d/c xanax and morphine - 09/11/20 - was offered sertraline 50 mg QD- patietn refused - 10/15- will d/c   - continue vitamin C & zinc - PT/OT following, mobilize as tolerated- OOB to chair daily as tolerated - intermittent CXR, ABG PRN  Leukocytosis- due to possible LLL pneumonia WBC -within reference range 10/16  PICC & foley removed- 10/11 - Continue Cefepime, tracheal aspirate positive for  pseudomonas - monitor WBC/ fever curve - daily CBC  Troponin elevation due to demand ischemia Hypertrophic cardiomyopathy Echo: LVEF 45-50% with severe concentric LVH, diastolic dysfunction - no pressor requirement - continuous cardiac monitoring - reduced Lasix to QD, continue as tolerated  Mild acute renal failure -resolved Hypokalemia -resolved Hypernatremia -resolved  - Strict I/O's: alert provider if UOP < 0.5 mL/kg/hr - Daily BMP, replace electrolytes PRN - Avoid nephrotoxic agents as able, ensure adequate renal perfusion  Anemia without obvious acute blood loss Last transfusion 9/23, Hgb stable  - Monitor for s/s of bleeding - Daily CBC - Transfuse for Hgb <7  Hyperglycemia   - Q 4 CBG, SSI  Best practice:   Diet: NPO with TF Pain/Anxiety/Delirium protocol (if indicated): morphine Q 4 PRN, seroquel QHS VAP protocol (if indicated): established DVT prophylaxis: Heparin SQ & SCDs GI prophylaxis: Pepcid Glucose control: Q 4 with SSI, target range 140-180 Mobility: Bedrest Code Status: FULL Family Communication: Patient updated bedside- 10/14 Disposition: ICU  Labs   CBC: Recent Labs  Lab 09/09/20 0347 09/10/20 0418 09/11/20 0606 09/12/20 0446 09/13/20 0523  WBC 8.2 7.1 7.9 8.5 9.3  NEUTROABS 6.4 5.4 6.2 6.8 7.2  HGB 9.6* 10.1* 9.7* 10.0* 10.0*  HCT 32.4* 33.1*  32.3* 32.7* 32.0*  MCV 82.9 82.1 82.4 81.3 80.8  PLT 303 325 319 347 588    Basic Metabolic Panel: Recent Labs  Lab 09/09/20 0347 09/10/20 0418 09/11/20 0606 09/12/20 0446 09/13/20 0523  NA 143 145 142 140 141  K 3.0* 3.5 4.0 4.0 4.1  CL 104 107 108 105 105  CO2 28 25 24 24 26   GLUCOSE 138* 144* 141* 129* 111*  BUN 27* 27* 26* 21* 21*  CREATININE 0.83 0.75 0.79 0.68 0.66  CALCIUM 9.4 9.3 9.7 9.7 9.7  MG 2.4 2.4 2.4 2.4 2.4  PHOS 3.3 2.8 2.8 2.7 3.2   GFR: Estimated Creatinine Clearance: 118.7 mL/min (by C-G formula based on SCr of 0.66 mg/dL). Recent Labs  Lab 09/07/20 0526  09/07/20 0526 09/08/20 0429 09/08/20 0429 09/09/20 0347 09/09/20 0347 09/10/20 0418 09/11/20 0606 09/12/20 0446 09/13/20 0523  PROCALCITON 0.46  --  0.42  --  0.20  --   --   --   --   --   WBC 13.7*   < > 10.3   < > 8.2   < > 7.1 7.9 8.5 9.3   < > = values in this interval not displayed.    Liver Function Tests: No results for input(s): AST, ALT, ALKPHOS, BILITOT, PROT, ALBUMIN in the last 168 hours. No results for input(s): LIPASE, AMYLASE in the last 168 hours. No results for input(s): AMMONIA in the last 168 hours.  ABG    Component Value Date/Time   PHART 7.47 (H) 08/31/2020 0800   PCO2ART 53 (H) 08/31/2020 0800   PO2ART 61 (L) 08/31/2020 0800   HCO3 38.6 (H) 08/31/2020 0800   O2SAT 92.5 08/31/2020 0800     Coagulation Profile: No results for input(s): INR, PROTIME in the last 168 hours.  Cardiac Enzymes: No results for input(s): CKTOTAL, CKMB, CKMBINDEX, TROPONINI in the last 168 hours.  HbA1C: No results found for: HGBA1C  CBG: Recent Labs  Lab 09/12/20 1617 09/12/20 2047 09/12/20 2345 09/13/20 0320 09/13/20 0732  GLUCAP 139* 120* 132* 107* 127*     Critical care provider statement:    Critical care time (minutes):  33   Critical care time was exclusive of:  Separately billable procedures and  treating other patients   Critical care was necessary to treat or prevent imminent or  life-threatening deterioration of the following conditions:  tracheostomy status, acute hypoxemic respiratory failure, severe ARDS, COVID19, severe morbid obesity, multiple comorbid conditions.    Critical care was time spent personally by me on the following  activities:  Development of treatment plan with patient or surrogate,  discussions with consultants, evaluation of patient's response to  treatment, examination of patient, obtaining history from patient or  surrogate, ordering and performing treatments and interventions, ordering  and review of laboratory studies and  re-evaluation of patient's condition   I assumed direction of critical care for this patient from another  provider in my specialty: no      Ottie Glazier, M.D.  [facility] [place]

## 2020-09-13 NOTE — Progress Notes (Signed)
PHARMACY CONSULT NOTE - FOLLOW UP  Pharmacy Consult for Electrolyte Monitoring and Replacement   Recent Labs: Potassium (mmol/L)  Date Value  09/13/2020 4.1  03/03/2014 3.9   Magnesium (mg/dL)  Date Value  09/13/2020 2.4   Calcium (mg/dL)  Date Value  09/13/2020 9.7   Calcium, Total (mg/dL)  Date Value  03/03/2014 9.3   Albumin (g/dL)  Date Value  08/25/2020 2.3 (L)  03/03/2014 4.2   Phosphorus (mg/dL)  Date Value  09/13/2020 3.2   Sodium (mmol/L)  Date Value  09/13/2020 141  03/03/2014 140     Assessment: 44 year old female admitted with COVID, intubated and sedated in the ICU. Patient is now s/p trach placement. Remains on tube feeds at this time. Pharmacy consult for electrolyte replacement.  Lasix, tube feeds and free water discontinued 10/15.  Goal of Therapy:  Electrolytes WNL  Plan:  No replacement indicated at this time. Follow up electrolytes with morning labs.  Noralee Space ,PharmD Clinical Pharmacist 09/13/2020 11:45 AM

## 2020-09-13 NOTE — Progress Notes (Signed)
Inpatient Rehab Admissions Coordinator:  Attempted to contact pt's husband over the phone to discuss CIR. No answer on either number provided in chart. Left message on one number.  Will continue to follow pt's progress with therapies and medical workup.   Gayland Curry, [place], [place] Admissions Coordinator [phone]

## 2020-09-14 ENCOUNTER — Inpatient Hospital Stay: Payer: Medicaid Other

## 2020-09-14 DIAGNOSIS — N170 Acute kidney failure with tubular necrosis: Secondary | ICD-10-CM | POA: Diagnosis not present

## 2020-09-14 DIAGNOSIS — J189 Pneumonia, unspecified organism: Secondary | ICD-10-CM

## 2020-09-14 DIAGNOSIS — E876 Hypokalemia: Secondary | ICD-10-CM

## 2020-09-14 DIAGNOSIS — J9601 Acute respiratory failure with hypoxia: Secondary | ICD-10-CM | POA: Diagnosis not present

## 2020-09-14 DIAGNOSIS — U071 COVID-19: Secondary | ICD-10-CM | POA: Diagnosis not present

## 2020-09-14 DIAGNOSIS — G9340 Encephalopathy, unspecified: Secondary | ICD-10-CM | POA: Diagnosis not present

## 2020-09-14 DIAGNOSIS — R748 Abnormal levels of other serum enzymes: Secondary | ICD-10-CM

## 2020-09-14 LAB — CBC WITH DIFFERENTIAL/PLATELET
Abs Immature Granulocytes: 0.27 10*3/uL — ABNORMAL HIGH (ref 0.00–0.07)
Basophils Absolute: 0.1 10*3/uL (ref 0.0–0.1)
Basophils Relative: 1 %
Eosinophils Absolute: 0.1 10*3/uL (ref 0.0–0.5)
Eosinophils Relative: 1 %
HCT: 32.7 % — ABNORMAL LOW (ref 36.0–46.0)
Hemoglobin: 10 g/dL — ABNORMAL LOW (ref 12.0–15.0)
Immature Granulocytes: 3 %
Lymphocytes Relative: 15 %
Lymphs Abs: 1.4 10*3/uL (ref 0.7–4.0)
MCH: 24.6 pg — ABNORMAL LOW (ref 26.0–34.0)
MCHC: 30.6 g/dL (ref 30.0–36.0)
MCV: 80.3 fL (ref 80.0–100.0)
Monocytes Absolute: 0.5 10*3/uL (ref 0.1–1.0)
Monocytes Relative: 5 %
Neutro Abs: 7 10*3/uL (ref 1.7–7.7)
Neutrophils Relative %: 75 %
Platelets: 378 10*3/uL (ref 150–400)
RBC: 4.07 MIL/uL (ref 3.87–5.11)
RDW: 25.2 % — ABNORMAL HIGH (ref 11.5–15.5)
WBC: 9.2 10*3/uL (ref 4.0–10.5)
nRBC: 0 % (ref 0.0–0.2)

## 2020-09-14 LAB — BASIC METABOLIC PANEL
Anion gap: 11 (ref 5–15)
BUN: 21 mg/dL — ABNORMAL HIGH (ref 6–20)
CO2: 25 mmol/L (ref 22–32)
Calcium: 9.9 mg/dL (ref 8.9–10.3)
Chloride: 103 mmol/L (ref 98–111)
Creatinine, Ser: 0.76 mg/dL (ref 0.44–1.00)
GFR, Estimated: >60 mL/min (ref >60)
Glucose, Bld: 117 mg/dL — ABNORMAL HIGH (ref 70–99)
Potassium: 4.3 mmol/L (ref 3.5–5.1)
Sodium: 139 mmol/L (ref 135–145)

## 2020-09-14 LAB — GLUCOSE, CAPILLARY
Glucose-Capillary: 109 mg/dL — ABNORMAL HIGH (ref 70–99)
Glucose-Capillary: 110 mg/dL — ABNORMAL HIGH (ref 70–99)
Glucose-Capillary: 113 mg/dL — ABNORMAL HIGH (ref 70–99)
Glucose-Capillary: 99 mg/dL (ref 70–99)
Glucose-Capillary: 112 mg/dL — ABNORMAL HIGH (ref 70–99)

## 2020-09-14 LAB — PHOSPHORUS: Phosphorus: 3.2 mg/dL (ref 2.5–4.6)

## 2020-09-14 LAB — MAGNESIUM: Magnesium: 2.4 mg/dL (ref 1.7–2.4)

## 2020-09-14 MED ORDER — TRAZODONE HCL 50 MG PO TABS
25.0000 mg | ORAL_TABLET | Freq: Once | ORAL | Status: AC
  Administered 2020-09-14: 25 mg
  Filled 2020-09-14: qty 1

## 2020-09-14 NOTE — Progress Notes (Signed)
Stopped tube feed per patient request, she has had 3 loose bowel movements since 1900 and states it is because of tube feed. Perhaps bolus feeds would benefit her and be closer to a normal eating schedule.

## 2020-09-14 NOTE — Consult Note (Signed)
Physical Medicine and Rehabilitation Consult Reason for Consult:COVID-19 Debility Referring Physician: Nolberto Hanlon, MD   HPI: Kathy Vega is a 44 y.o. female who was admitted with COVID-19 pneumonia and ARDS requiring intubation and mechanical ventilation. She is currently requiring trach and is medically stable for acute rehab. Currently with trach collar and unable to phonate. Physical Medicine & Rehabilitation was consulted to assess candidacy for CIR.   Review of Systems  Constitutional: Negative.  Negative for fever.  HENT: Negative.   Eyes: Negative.   Respiratory: Positive for cough.   Cardiovascular: Negative.   Gastrointestinal: Negative.   Genitourinary: Negative.   Musculoskeletal: Negative.   Skin: Positive for itching. Negative for rash.  Neurological: Negative.   Endo/Heme/Allergies: Negative.   Psychiatric/Behavioral: Negative.    Past Medical History:  Diagnosis Date  . Anemia   . Thyroid disease    Past Surgical History:  Procedure Laterality Date  . CHOLECYSTECTOMY    . LEG SURGERY    . TRACHEOSTOMY TUBE PLACEMENT N/A 09/04/2020   Procedure: TRACHEOSTOMY;  Surgeon: Beverly Gust, MD;  Location: [facility] ORS;  Service: ENT;  Laterality: N/A;   History reviewed. No pertinent family history. Social History:  reports that she has never smoked. She has never used smokeless tobacco. She reports that she does not drink alcohol. No history on file for drug use. Allergies: No Known Allergies Medications Prior to Admission  Medication Sig Dispense Refill  . ferrous sulfate 325 (65 FE) MG EC tablet Take 325 mg by mouth daily.    Marland Kitchen levothyroxine (SYNTHROID, LEVOTHROID) 125 MCG tablet Take 125 mcg by mouth daily before breakfast.      Home: Home Living Family/patient expects to be discharged to:: Private residence Living Arrangements: Spouse/significant other, Children (58,77,1 year olds) Available Help at Discharge: Family Type of Home: House Home  Access: Level entry Home Layout: One level Bathroom Shower/Tub: Multimedia programmer: Standard Home Equipment: None  Functional History: Prior Function Level of Independence: Independent Comments: No falls in past 6 months. Functional Status:  Mobility: Bed Mobility Overal bed mobility: Needs Assistance Bed Mobility: Rolling Rolling: Mod assist Supine to sit: Min assist, Mod assist Sit to supine: Max assist General bed mobility comments: not addressed as patient sitting up with nursing staff on arrival to room  Transfers Overall transfer level: Needs assistance Equipment used: Rolling walker (2 wheeled) Transfers: Stand Pivot Transfers Sit to Stand: Min assist Stand pivot transfers: Min assist General transfer comment: verbal and tactile cues for safe hand placement and positioning with sitting and standing. lifting assistance required for standing  Ambulation/Gait Ambulation/Gait assistance: Min guard (+1 only throughout session) Gait Distance (Feet): 200 Feet Assistive device: Rolling walker (2 wheeled) Gait Pattern/deviations: WFL(Within Functional Limits), Narrow base of support General Gait Details: patient declined ambulating this session with no reason specified. patient participated with standing exercises  Gait velocity: decreased    ADL: ADL Overall ADL's : Needs assistance/impaired Grooming: Wash/dry hands, Wash/dry face, Oral care, Minimal assistance, Sitting Upper Body Bathing: Minimal assistance, Standing Upper Body Dressing : Moderate assistance, Bed level Lower Body Dressing: Maximal assistance, Total assistance, Bed level General ADL Comments: Pt with gross strengh of 3-/5 throughout but fatigues quickly with self are tasks  Cognition: Cognition Overall Cognitive Status: Within Functional Limits for tasks assessed Orientation Level: Oriented X4 Cognition Arousal/Alertness: Awake/alert Behavior During Therapy: WFL for tasks  assessed/performed Overall Cognitive Status: Within Functional Limits for tasks assessed General Comments: patient occasionally using note pad  to communicate via writing. patient following all commands without difficulty. patient mouthing and nodding for most communication   Blood pressure 135/89, pulse (!) 110, temperature 99.6 F (37.6 C), temperature source Axillary, resp. rate 20, height 5' 9.13" (1.756 m), weight 109.7 kg, SpO2 97 %. Physical Exam   General: Alert, No apparent distress HEENT: Head is normocephalic, trach collar in place Neck: Supple without JVD or lymphadenopathy Heart: Tachycardic. No murmurs rubs or gallops Chest: CTA bilaterally without wheezes, rales, or rhonchi; no distress Abdomen: Soft, non-tender, non-distended, bowel sounds positive. Extremities: No clubbing, cyanosis, or edema. Pulses are 2+ Skin: Clean and intact without signs of breakdown; IV in left hand Neuro: 4-/5 strength throughout. Alert. Unable to phonate Psych: Pt's affect is flat. Pt is cooperative   Results for orders placed or performed during the hospital encounter of 08/13/20 (from the past 24 hour(s))  Glucose, capillary     Status: Abnormal   Collection Time: 09/13/20  4:03 PM  Result Value Ref Range   Glucose-Capillary 126 (H) 70 - 99 mg/dL  Glucose, capillary     Status: Abnormal   Collection Time: 09/13/20  7:40 PM  Result Value Ref Range   Glucose-Capillary 116 (H) 70 - 99 mg/dL  Glucose, capillary     Status: Abnormal   Collection Time: 09/13/20 11:14 PM  Result Value Ref Range   Glucose-Capillary 137 (H) 70 - 99 mg/dL  Glucose, capillary     Status: Abnormal   Collection Time: 09/14/20  4:02 AM  Result Value Ref Range   Glucose-Capillary 109 (H) 70 - 99 mg/dL  Magnesium     Status: None   Collection Time: 09/14/20  5:14 AM  Result Value Ref Range   Magnesium 2.4 1.7 - 2.4 mg/dL  CBC with Differential/Platelet     Status: Abnormal   Collection Time: 09/14/20  5:14 AM   Result Value Ref Range   WBC 9.2 4.0 - 10.5 K/uL   RBC 4.07 3.87 - 5.11 MIL/uL   Hemoglobin 10.0 (L) 12.0 - 15.0 g/dL   HCT 32.7 (L) 36 - 46 %   MCV 80.3 80.0 - 100.0 fL   MCH 24.6 (L) 26.0 - 34.0 pg   MCHC 30.6 30.0 - 36.0 g/dL   RDW 25.2 (H) 11.5 - 15.5 %   Platelets 378 150 - 400 K/uL   nRBC 0.0 0.0 - 0.2 %   Neutrophils Relative % 75 %   Neutro Abs 7.0 1.7 - 7.7 K/uL   Lymphocytes Relative 15 %   Lymphs Abs 1.4 0.7 - 4.0 K/uL   Monocytes Relative 5 %   Monocytes Absolute 0.5 0.1 - 1.0 K/uL   Eosinophils Relative 1 %   Eosinophils Absolute 0.1 0.0 - 0.5 K/uL   Basophils Relative 1 %   Basophils Absolute 0.1 0.0 - 0.1 K/uL   Immature Granulocytes 3 %   Abs Immature Granulocytes 0.27 (H) 0.00 - 0.07 K/uL  Phosphorus     Status: None   Collection Time: 09/14/20  5:14 AM  Result Value Ref Range   Phosphorus 3.2 2.5 - 4.6 mg/dL  Basic metabolic panel     Status: Abnormal   Collection Time: 09/14/20  5:14 AM  Result Value Ref Range   Sodium 139 135 - 145 mmol/L   Potassium 4.3 3.5 - 5.1 mmol/L   Chloride 103 98 - 111 mmol/L   CO2 25 22 - 32 mmol/L   Glucose, Bld 117 (H) 70 - 99 mg/dL   BUN  21 (H) 6 - 20 mg/dL   Creatinine, Ser 0.76 0.44 - 1.00 mg/dL   Calcium 9.9 8.9 - 10.3 mg/dL   GFR, Estimated >60 >60 mL/min   Anion gap 11 5 - 15  Glucose, capillary     Status: Abnormal   Collection Time: 09/14/20  7:11 AM  Result Value Ref Range   Glucose-Capillary 110 (H) 70 - 99 mg/dL  Glucose, capillary     Status: Abnormal   Collection Time: 09/14/20 11:27 AM  Result Value Ref Range   Glucose-Capillary 113 (H) 70 - 99 mg/dL   No results found.   Assessment/Plan: Diagnosis: COVID-19 Debility 1. Does the need for close, 24 hr/day medical supervision in concert with the patient's rehab needs make it unreasonable for this patient to be served in a less intensive setting? Yes 2. Co-Morbidities requiring supervision/potential complications: tachycardia, tracheostomy, loose BM,  edema, insomnia, morbid obesity, OSA, acute hypoxic respiratory failure 3. Due to bladder management, bowel management, safety, skin/wound care, disease management, medication administration, pain management and patient education, dysphagia, does the patient require 24 hr/day rehab nursing? Yes 4. Does the patient require coordinated care of a physician, rehab nurse, therapy disciplines of PT, OT, SLP to address physical and functional deficits in the context of the above medical diagnosis(es)? Yes Addressing deficits in the following areas: balance, endurance, locomotion, strength, transferring, bowel/bladder control, bathing, dressing, feeding, grooming, toileting, swallowing and psychosocial support 5. Can the patient actively participate in an intensive therapy program of at least 3 hrs of therapy per day at least 5 days per week? Yes 6. The potential for patient to make measurable gains while on inpatient rehab is excellent 7. Anticipated functional outcomes upon discharge from inpatient rehab are min assist  with PT, min assist with OT, modified independent with SLP. 8. Estimated rehab length of stay to reach the above functional goals is: 2-3 weeks 9. Anticipated discharge destination: Home 10. Overall Rehab/Functional Prognosis: excellent  RECOMMENDATIONS: This patient's condition is appropriate for continued rehabilitative care in the following setting: CIR Patient has agreed to participate in recommended program. Yes Note that insurance prior authorization may be required for reimbursement for recommended care.  Comment: Thank you for this consult. Admission coordinator to follow.   I have personally performed a face to face diagnostic evaluation, including, but not limited to relevant history and physical exam findings, of this patient and developed relevant assessment and plan.  Additionally, I have reviewed and concur with the physician assistant's documentation above.  Izora Ribas, MD 09/14/2020

## 2020-09-14 NOTE — Progress Notes (Signed)
Physical Therapy Treatment Patient Details Name: Kathy Vega MRN: [id number] DOB: [birth year] Today's Date: 09/14/2020    History of Present Illness Pt is a 44 y.o. female presenting to hospital 9/16 with SOB and confusion; initial O2 sats in 20's; pt COVID (+) 6 days prior.  Mechanically intubated 9/17.  S/p trach 10/8.  Pt admitted with acute hypoxic respiratory failure secondary COVID-19 PNA and ARDS, possible OSA with upper airway swelling s/p trach, and severe acute hypoxic and hypercapnic respiratory failure.  PMH includes anemia, thyroid disease, h/o leg surgery.    PT Comments    Pt resting in recliner upon PT arrival.  At very beginning of session, therapist called respiratory to assist with trach collar/O2 set-up for ambulation but unfortunately respiratory ended up not being available to come during the time therapist was in the room with pt.  Focused on sitting and standing ex's (R LE weakness still noted from initial evaluation).  Also focused on standing static and dynamic balance activities and progression with taking steps forward/backward 5 feet without UE support. HR 116 bpm at rest and increased up to 139 bpm with activities; O2 sats 95% or greater on 5 L O2 via trach collar.  Will continue to focus on strengthening, balance, and progressive functional mobility during hospitalization.    Follow Up Recommendations  CIR     Equipment Recommendations  Rolling walker with 5" wheels;3in1 (PT)    Recommendations for Other Services OT consult     Precautions / Restrictions Precautions Precautions: Fall Precaution Comments: Trach (trach collar); HOB >30 degrees; Dobhoff Restrictions Weight Bearing Restrictions: No    Mobility  Bed Mobility               General bed mobility comments: Deferred (pt sitting in recliner upon PT arrival)  Transfers Overall transfer level: Needs assistance Equipment used: Rolling walker (2 wheeled) Transfers: Sit to/from Stand Sit  to Stand: Min guard;Min assist         General transfer comment: x3 trials standing from recliner; vc's for UE placement; increased effort to stand  Ambulation/Gait Ambulation/Gait assistance: Min guard;Min assist Gait Distance (Feet):  (5 feet forward x2 and 5 feet backwards x2 (no UE support)) Assistive device: None   Gait velocity: decreased   General Gait Details: vc's to increase BOS; decreased B LE step length; slow steps   Stairs             Wheelchair Mobility    Modified Rankin (Stroke Patients Only)       Balance Overall balance assessment: Needs assistance Sitting-balance support: No upper extremity supported;Feet supported Sitting balance-Leahy Scale: Good Sitting balance - Comments: steadying with reaching outside base of support    Standing balance support: No upper extremity supported Standing balance-Leahy Scale: Good Standing balance comment: pt able to reach mildly forward outside BOS with CGA to min assist for balance                            Cognition Arousal/Alertness: Awake/alert Behavior During Therapy: WFL for tasks assessed/performed Overall Cognitive Status: Within Functional Limits for tasks assessed                                 General Comments: pt using note pad, dry erase board, mouthing words, nodding head, or gestures during session      Exercises General Exercises - Lower Extremity  Ankle Circles/Pumps: AROM;Strengthening;Both;10 reps;Seated Long Arc Quad: AROM;Strengthening;Both;10 reps;Supine Hip Flexion/Marching: AROM;Strengthening;Both;10 reps;Seated (also x10 reps standing with B UE support on RW) Heel Raises: AROM;Strengthening;Both;10 reps;Standing (decreased heel clearance noted R LE)    General Comments  Pt agreeable to PT session.      Pertinent Vitals/Pain Pain Assessment: No/denies pain Pain Intervention(s): Limited activity within patient's tolerance;Monitored during  session;Repositioned    Home Living                      Prior Function            PT Goals (current goals can now be found in the care plan section) Acute Rehab PT Goals Patient Stated Goal: go home PT Goal Formulation: With patient Time For Goal Achievement: 09/21/20 Potential to Achieve Goals: Good Progress towards PT goals: Progressing toward goals    Frequency    7X/week      PT Plan Current plan remains appropriate    Co-evaluation              AM-PAC PT "6 Clicks" Mobility   Outcome Measure  Help needed turning from your back to your side while in a flat bed without using bedrails?: A Little Help needed moving from lying on your back to sitting on the side of a flat bed without using bedrails?: A Little Help needed moving to and from a bed to a chair (including a wheelchair)?: A Little Help needed standing up from a chair using your arms (e.g., wheelchair or bedside chair)?: A Little Help needed to walk in hospital room?: A Little Help needed climbing 3-5 steps with a railing? : A Little 6 Click Score: 18    End of Session Equipment Utilized During Treatment: Oxygen (5 L O2 trach mask) Activity Tolerance: Patient tolerated treatment well Patient left: in chair;with call bell/phone within reach;with chair alarm set Nurse Communication: Mobility status;Precautions;Other (comment) (via white board) PT Visit Diagnosis: Other abnormalities of gait and mobility (R26.89);Muscle weakness (generalized) (M62.81);Difficulty in walking, not elsewhere classified (R26.2)     Time: 1450-1530 PT Time Calculation (min) (ACUTE ONLY): 40 min  Charges:  $Gait Training: 8-22 mins $Therapeutic Exercise: 8-22 mins $Therapeutic Activity: 8-22 mins                    Leitha Bleak, PT 09/14/20, 5:44 PM

## 2020-09-14 NOTE — Progress Notes (Signed)
PROGRESS NOTE    Kathy Vega  YNW:[id number] DOB: 05-Dec-1975 DOA: 08/13/2020 PCP: [facility] [place] [facility]    Brief Narrative:  44 yo unvaccinated female admitted with COVID-19 pneumonia & ARDS requiring intubation and mechanical ventilation.  09/16: Pt admitted to ICU requiring Heated HFNC and NRB, however remained in the ER pending ICU bed availability  09/16: CT Head revealed no acute intracranial abnormality 09/17: Pt with worsening hypoxia requiring mechanical intubation 09/18: [facility] refused to accept pt for ECMO 09/21: Attempted to transfer pt to [facility] for possible assessment for ECMO, however both hospitals DO NOT have bed availability and are at capacity and pt may or may NOT be an ECMO candidate at this time  09/23: Pt unproned  09/24: Resumed proning protocol 09/25: Resumed proning protocol  09/26: Pt unproned 9/27-No onvernight events, patient remains in ARDS on elevated settings. 08/25/20- patient is on maximal settings with sedation. She is on day 11 of her illness with little improvement. Ive attempted to reach husband via cell no answer, ive successfully found her daughter and spoke with her regarding care plan and she is agreeable but requested for me to speak with Kathy Vega (husband ) and has provided me with secondary phone number [phone] but again only voice mail was reachable. Have left voicemail and will discuss trache with family when they are available.  08/26/20-patient without overnight events, ive called POA husband again today but was unable to reach him and left voice mail.  08/27/20-patient critically ill with overall poor prognosis, she had desaturation event today and was difficult to improve requiring multiple changes on ventilator by RT.  08/28/20- Patient is weaned to 60% FiO2 on MV. Have called Kathy Vega today and updated him. We discussed trache and he is agreeable.  08/29/20- Spoke with husband today,  answered questions ,updated on care plan, he is ready to speak with ENT regarding trache palcement 08/30/20-patient had SBT today she was awake and followed verbal communication but after 2 hours had severly labored breathing and thick high volume secretions. I have met with husband Kathy Vega and he was able to see patient at bedside we discussed trache. I have updated Dr Tami Ribas regarding trache plan.  08/31/20- no airleak will need trach, discussed with Dr.Vaught. 09/01/20-Still no air leak. Patient has trach scheduled for Friday of this week and thus no need for steroids at this time 09/02/20- no air leak, continuing plan of care, no other concerns 09/03/20- very minimal air leak, continue with trach 10/8 09/04/20- Trach placed 09/07/20- Patient OOB with lift, weaned to 5/5, PICC & foley removed 09/08/20- tolerated 1 hr TC, OOB to chair without lift, pivoted on her own strength 09/09/20- patient not sleeping well O/N, bad dreams and overall feeling "down", tolerated TC 2 hours 09/11/20- patient is participating with PT while on trache collar and did well. Sutures removed. Will optimize for Stepdown status today.  FiO2 - 35%.  Pseudomonas on tracheal aspirate - plan for 7 d cefepime.  Trache size 8 switch to cuffed 6 today to allow for speech.  09/12/20- Patient stable, family came for visit. Continues to have clinical signs of improvement. Continuing current care plan. TOC consultation for post trache placement.  09/13/20- patient is improved, plan to optimize for [facility] transfer today. Will page out to [facility] for pick up this am.   Consultants:   pccm  Procedures:  ETT 9/17 >> 10/8 PICC 9/17 >> 10/11 Tracheostomy 10/8 >>  Metronidazole 9/24 >>  9/29 Cefepime 10/11 >>   Antimicrobials:  MRSA PCR 9/16 >> negative [facility] 9/16 >> negative Tracheal aspirate 10/11 >> Pseudomonas aeruginosa   Subjective: Pt had TF stopped early this am due to 3 loose BM. She is c/o trach collar  bothering her.   Objective: Vitals:   09/14/20 0700 09/14/20 0800 09/14/20 0900 09/14/20 1126  BP: 117/82 111/82 112/83 135/89  Pulse: (!) 102 (!) 114 (!) 121 (!) 110  Resp: 19 (!) 0 (!) 21 20  Temp: 97.9 F (36.6 C)   99.6 F (37.6 C)  TempSrc: Axillary   Axillary  SpO2: 97% 98% 98% 97%  Weight:      Height:        Intake/Output Summary (Last 24 hours) at 09/14/2020 1527 Last data filed at 09/14/2020 0800 Gross per 24 hour  Intake 1093.33 ml  Output 650 ml  Net 443.33 ml   Filed Weights   09/08/20 0447 09/09/20 0500 09/10/20 0500  Weight: 109.4 kg 109.1 kg 109.7 kg    Examination:  General exam: Appears calm and comfortable  Trach collar in place, no drainge Respiratory system: coarse bs, no wheezing Cardiovascular system: S1 & S2 heard, RRR. No JVD, murmurs, rubs, gallops or clicks.  Gastrointestinal system: Abdomen is mildly distended, soft and nontender.  Normal bowel sounds heard. Central nervous system: Alert and oriented. Grossly intact Extremities: trace edema b/l Psychiatry: Judgement and insight appear normal. Mood & affect appropriate.     Data Reviewed: I have personally reviewed following labs and imaging studies  CBC: Recent Labs  Lab 09/10/20 0418 09/11/20 0606 09/12/20 0446 09/13/20 0523 09/14/20 0514  WBC 7.1 7.9 8.5 9.3 9.2  NEUTROABS 5.4 6.2 6.8 7.2 7.0  HGB 10.1* 9.7* 10.0* 10.0* 10.0*  HCT 33.1* 32.3* 32.7* 32.0* 32.7*  MCV 82.1 82.4 81.3 80.8 80.3  PLT 325 319 347 351 356   Basic Metabolic Panel: Recent Labs  Lab 09/10/20 0418 09/11/20 0606 09/12/20 0446 09/13/20 0523 09/14/20 0514  NA 145 142 140 141 139  K 3.5 4.0 4.0 4.1 4.3  CL 107 108 105 105 103  CO2 25 24 24 26 25   GLUCOSE 144* 141* 129* 111* 117*  BUN 27* 26* 21* 21* 21*  CREATININE 0.75 0.79 0.68 0.66 0.76  CALCIUM 9.3 9.7 9.7 9.7 9.9  MG 2.4 2.4 2.4 2.4 2.4  PHOS 2.8 2.8 2.7 3.2 3.2   GFR: Estimated Creatinine Clearance: 118.7 mL/min (by C-G formula based  on SCr of 0.76 mg/dL). Liver Function Tests: No results for input(s): AST, ALT, ALKPHOS, BILITOT, PROT, ALBUMIN in the last 168 hours. No results for input(s): LIPASE, AMYLASE in the last 168 hours. No results for input(s): AMMONIA in the last 168 hours. Coagulation Profile: No results for input(s): INR, PROTIME in the last 168 hours. Cardiac Enzymes: No results for input(s): CKTOTAL, CKMB, CKMBINDEX, TROPONINI in the last 168 hours. BNP (last 3 results) No results for input(s): PROBNP in the last 8760 hours. HbA1C: No results for input(s): HGBA1C in the last 72 hours. CBG: Recent Labs  Lab 09/13/20 1940 09/13/20 2314 09/14/20 0402 09/14/20 0711 09/14/20 1127  GLUCAP 116* 137* 109* 110* 113*   Lipid Profile: No results for input(s): CHOL, HDL, LDLCALC, TRIG, CHOLHDL, LDLDIRECT in the last 72 hours. Thyroid Function Tests: No results for input(s): TSH, T4TOTAL, FREET4, T3FREE, THYROIDAB in the last 72 hours. Anemia Panel: No results for input(s): VITAMINB12, FOLATE, FERRITIN, TIBC, IRON, RETICCTPCT in the last 72 hours. Sepsis Labs: Recent Labs  Lab 09/08/20 0429 09/09/20 0347  PROCALCITON 0.42 0.20    Recent Results (from the past 240 hour(s))  Culture, respiratory (non-expectorated)     Status: None   Collection Time: 09/07/20  4:02 PM   Specimen: Tracheal Aspirate; Respiratory  Result Value Ref Range Status   Specimen Description   Final    TRACHEAL ASPIRATE Performed at [facility], [address], [place], [place] [address]    Special Requests   Final    NONE Performed at [facility], [place], [place] [address]    Gram Stain   Final    RARE WBC PRESENT,BOTH PMN AND MONONUCLEAR FEW GRAM NEGATIVE RODS Performed at [facility], [place] [address], [place], [place] [address]    Culture RARE PSEUDOMONAS AERUGINOSA  Final   Report Status 09/09/2020 FINAL  Final   Organism ID, Bacteria PSEUDOMONAS AERUGINOSA  Final       Susceptibility   Pseudomonas aeruginosa - MIC*    CEFTAZIDIME 4 SENSITIVE Sensitive     CIPROFLOXACIN <=0.25 SENSITIVE Sensitive     GENTAMICIN <=1 SENSITIVE Sensitive     IMIPENEM 2 SENSITIVE Sensitive     PIP/TAZO 8 SENSITIVE Sensitive     CEFEPIME 2 SENSITIVE Sensitive     * RARE PSEUDOMONAS AERUGINOSA         Radiology Studies: No results found.      Scheduled Meds: . chlorhexidine  15 mL Mouth Rinse BID  . Chlorhexidine Gluconate Cloth  6 each Topical Daily  . enoxaparin (LOVENOX) injection  40 mg Subcutaneous Q24H  . feeding supplement (PROSource TF)  45 mL Per Tube TID  . fluconazole  100 mg Per Tube Q24H  . insulin aspart  0-20 Units Subcutaneous Q4H  . mouth rinse  15 mL Mouth Rinse q12n4p  . nystatin  5 mL Oral QID  . pantoprazole sodium  40 mg Per Tube BID  . traZODone  100 mg Per Tube QHS   Continuous Infusions: . sodium chloride Stopped (09/07/20 0725)  . feeding supplement (VITAL 1.5 CAL) 1,000 mL (09/13/20 0931)    Assessment & Plan:   Active Problems:   COVID-19   Acute renal failure (ARF) (HCC)   Elevated liver enzymes   CAP (community acquired pneumonia)   Acute encephalopathy   Hypokalemia   Acute respiratory failure (HCC)   Acute hypoxic respiratory failure secondary to COVID-19 pneumonia and ARDS Morbid obesity, possible OSA S/p course of remdesevir, azithromycin & ceftriaxone. Off airborne precautions. Tracheostomy placed 10/8. Cefepime started 10/11 for suspected HCAP-tracheal aspriate +pseudomonas On trach collar-today complaining of tightness will ask RT to evaluate Seroquel, Xanax and morphine was discontinued on 09/11/2020 Trazodone nightly as a sleep aid Needs sleep study as outpatient at some point Apparently was offered sertraline patient refused thus it was discontinued recieved vitamin C and zinc PT OT-CIR PCCM following   Leukocytosis-possibly due to left lower lobe pneumonia PICC & foley removed- 10/11 Continue with  cefepime as tracheal aspirate was positive for Pseudomonas Monitor CBC   Troponin elevation-due to demand ischemia Hypertrophic cardiomyopathy Echo: LVEF 45-50% with severe concentric LVH, diastolic dysfunction Was on lasix.    Mild acute renal failure -resolved Hypokalemia -resolved Hypernatremia -resolved Remains stable Avoid nephrotoxic agents Monitor labs  Anemia without obvious acute blood loss Last transfusion 9/23 Hg stable, continue to monitor  Hyperglycemia  BG stable. On TF. RISS  Nutrition- On TF, was stopped early this am due to loose bm x3 Will resume  at 75m/hr, increase as tolerated.    DVT prophylaxis: lovenox Code Status:full Family Communication: none at bedside  Status is: Inpatient  Remains inpatient appropriate because:IV treatments appropriate due to intensity of illness or inability to take PO   Dispo: The patient is from: Home              Anticipated d/c is to: CIR              Anticipated d/c date is: 3 days              Patient currently is not medically stable to d/c.            LOS: 32 days   Time spent: 45 min with >50% on coc    SNolberto Hanlon MD [facility] Pager [phone]  If 7PM-7AM, please contact night-coverage [email or web address] Password [facility]10/18/2021, 3:27 PM

## 2020-09-14 NOTE — Progress Notes (Signed)
Speech Language Pathology Treatment: Nada Boozer Speaking valve  Patient Details Name: Kathy Vega MRN: [id number] DOB: [birth year] Today's Date: 09/14/2020 Time: 1210-1300 SLP Time Calculation (min) (ACUTE ONLY): 50 min  Assessment / Plan / Recommendation Clinical Impression  Pt seen today for ongoing PMV assessment; A/O x4 and using writing and/or mouthing and gestures for communication w/ others. Pt's Tracheostomy was placed on 09/04/2020. Pt was explained the PMV, its use for communication, and its care again. Discussed the change in air flow w/ a tracheostomy and purpose of the PMV for verbal communication as well as its role in swallowing. Pt has a Shiley #6XL, cuffed; cuff DEFLATED, pt on trach collar at Baseline. Finger occlusion was attempted initially w/ min redirected, superior airflow noted despite direction/encouragement. Upon placing the PMV, pt was only able to tolerate ~30, then 45 seconds w/out discomfort and air stacking noted. Upon removing the PMV, a rush of airflow was noted at the level of the trach. This was attempted x2. PMV was removed. Pt endorsed discomfort in breathing; unable to exhale sufficiently. O2 sats and RR/HR remained at Baseline during/post removal of PMV. Discussion was had w/ pt re: inability to tolerate wearing of the PMV and potential issues such as Edema; NSG agreed. Pt is able to redirect slight/min airflow today which is an improvement from Friday/Sat. Pt's improved medical status overall bodes well for improvement for toleration of PMV placement/wear.   Thrush txs ongoing. Discussed frequent oral care(practiced w/ model) and Plan for Few Pleasure Ice Chips Post Oral Care for the weekend -- w/ limitation d/t concern for oral intake w/out wearing the PMV. Will attempt MBSS tomorrow in order to assess swallow function WITHOUT the PMV placed d/t pt's request to move to an oral diet and have the Dobhoff removed. MD/NSG agreed. Instructions posted for ice chips,  oral care. Pt agreed.    HPI HPI: (08/13/2020) Pt is a 44 yo female with a PMH of Hypothyroidism, morbid Obesity, and Anemia.  She presented to [facility] ER on 09/16 with c/o shortness of breath, headache, and confusion.  Upon arrival to the ER pts O2 sats were in the 20's she was placed on 5L of O2 via nasal canula, however her O2 sats only increased to 29%.  She was diagnosed with COVID-19 6 days prior to ER presentation, and she is unvaccinated.  She was placed on NRB and heated HFNC with increase in O2 sats to 93-95%.  Pt also noted to be confused and uncooperative likely secondary to taking a significant amount of cough syrup with codeine at home.  CT Head negative.  CXR revealed bilateral infiltrates.  PCCM team contacted for ICU admission.  She was orally intubated 08/14/2020 w/ OGT; traceheostomy 09/04/2020 w/ Dobhoff present for TFs support.  Pt has weaned to [facility] trials during the day w/ vent support at night as needed.       SLP Plan  Continue with current plan of care       Recommendations  Diet recommendations:  (TBD) Medication Administration: Via alternative means      Patient may use Passy-Muir Speech Valve: with SLP only PMSV Supervision: Full MD: Please consider changing trach tube to :  (done)         General recommendations:  (dietician f/u) Oral Care Recommendations: Oral care QID;Oral care prior to ice chip/H20;Patient independent with oral care Follow up Recommendations: Inpatient Rehab (TBD) SLP Visit Diagnosis: Aphonia (R49.1) (tracheostomy) Plan: Continue with current plan of care  Holliday, MS, CCC-SLP Speech Language Pathologist Rehab Services [phone] [facility] 09/14/2020, 4:56 PM

## 2020-09-14 NOTE — Progress Notes (Signed)
Pt. suctionned for moderate amt. Of thick white secretions x 2. Pt. Tolerated all well.

## 2020-09-14 NOTE — Progress Notes (Signed)
PT Cancellation Note  Patient Details Name: Kathy Vega MRN: [id number] DOB: Nov 30, 1975   Cancelled Treatment:    Reason Eval/Treat Not Completed: Other (comment).  Pt resting in bed upon PT arrival (transferred from ICU to medical unit this morning).  Pt reporting recently getting trach tube changed d/t being "dry" and feeling like she was "choking" but is feeling better now.  Pt requesting to rest at this time (d/t above) but requesting therapist to return this afternoon.  Will re-attempt PT session this afternoon.  Leitha Bleak, PT 09/14/20, 11:54 AM

## 2020-09-14 NOTE — Progress Notes (Signed)
°   09/14/20 1617  Assess: MEWS Score  Temp 98 F (36.7 C)  BP 110/79  Pulse Rate (!) 111  Resp 18  SpO2 95 %  O2 Device Room Air  Patient Activity (if Appropriate) In bed  O2 Flow Rate (L/min) 5 L/min  FiO2 (%) 28 %  Assess: MEWS Score  MEWS Temp 0  MEWS Systolic 0  MEWS Pulse 2  MEWS RR 0  MEWS LOC 0  MEWS Score 2  MEWS Score Color Yellow  Assess: if the MEWS score is Yellow or Red  Were vital signs taken at a resting state? Yes  Focused Assessment No change from prior assessment  Early Detection of Sepsis Score *See Row Information* Low  MEWS guidelines implemented *See Row Information* Yes  Treat  Pain Scale 0-10  Pain Score 0  Take Vital Signs  Increase Vital Sign Frequency  Yellow: Q 2hr X 2 then Q 4hr X 2, if remains yellow, continue Q 4hrs  Escalate  MEWS: Escalate Yellow: discuss with charge nurse/RN and consider discussing with provider and RRT  Notify: Charge Nurse/RN  Name of Charge Nurse/RN Notified Debi Zavier Canela, RN / Myself  Date Charge Nurse/RN Notified 09/14/20  Time Charge Nurse/RN Notified 1617  Notify: Provider  Provider Name/Title Dr. Kurtis Bushman   Date Provider Notified 09/14/20  Time Provider Notified 1620  Notification Type Page  Notification Reason Change in status;Other (Comment) (this has been normal in ICU )  Response No new orders  Date of Provider Response 09/14/20  Time of Provider Response 1630  Document  Patient Outcome Other (Comment) (this was normal for her in ICU, will continue to monitor )  Progress note created (see row info) Yes

## 2020-09-14 NOTE — Progress Notes (Signed)
Nutrition Follow-up  DOCUMENTATION CODES:   Obesity unspecified  INTERVENTION:  Plan was to resume tube feeds slowly today but patient is currently refusing tube feeds. Recommend:  -Resume Vital 1.5 Cal at 20 mL/hr and advance by 10 mL/hr every 6 hours to goal rate of 70 mL/hr (1680 mL goal daily volume) -PROSource TF 45 mL TID -Goal regimen provides 2640 kcal, 146 grams of protein, 1277 mL H2O daily  NUTRITION DIAGNOSIS:   Inadequate oral intake related to inability to eat as evidenced by NPO status.  Ongoing.  GOAL:   Patient will meet greater than or equal to 90% of their needs  Not met at this time.  MONITOR:   Vent status, Labs, Weight trends, TF tolerance, I & O's  REASON FOR ASSESSMENT:   Ventilator, Consult Enteral/tube feeding initiation and management  ASSESSMENT:   44 year old female admitted with COVID-19 PNA.  9/17 intubated 10/8 s/p tracheostomy tube placement 10/18 transferred from ICU to med/surg  Met with patient at bedside. She is able to communicate by mouthing words and writing. Tube feeds not infusing at time of RD assessment. She reports she requested her tube feeds be discontinued this AM after she had 3 loose bowel movements. Patient remains NPO pending SLP evaluation, which now will not occur until tomorrow. RD discussed importance of adequate nutrition through tube feeds as patient is NPO. Per MD plan was to restart tube feeds at 20 mL/hr and advance as tolerated. However patient refuses to resume tube feeds today or tonight, even after RD spent a long time explaining importance. She reports she will consider resuming tube feeds tomorrow if she is unable to eat by mouth. Per secure chat conversation plan is for MD to order abdominal x-ray today.  Enteral Access: 8 Fr. Dobbhoff tube placed 10/11; terminates in duodenal bulb per abdominal x-ray 10/11  Medications reviewed and include: Novolog 0-20 units Q4hrs, Protonix.   Labs reviewed: CBG  109-137, BUN 21.   I/O: 650 mL UOP yesterday + 2 occurrences unmeasured UOP  Weight trend: 109.7 kg on 10/14; -9.5 kg overall from 9/16  Discussed with RN over the phone.  Diet Order:   Diet Order    None     EDUCATION NEEDS:   No education needs have been identified at this time  Skin:  Skin Assessment: Skin Integrity Issues: Skin Integrity Issues:: DTI, Other (Comment) DTI: buttocks Other: blister to buttocks  Last BM:  10/11- type 7  Height:   Ht Readings from Last 1 Encounters:  09/04/20 5' 9.13" (1.756 m)   Weight:   Wt Readings from Last 1 Encounters:  09/10/20 109.7 kg   Ideal Body Weight:  65.9 kg  BMI:  Body mass index is 35.58 kg/m.  Estimated Nutritional Needs:   Kcal:  2600-2800  Protein:  145-155 grams  Fluid:  >/= 2 L/day  Jacklynn Barnacle, MS, RD, LDN Pager number available on Amion

## 2020-09-15 ENCOUNTER — Inpatient Hospital Stay: Payer: Medicaid Other

## 2020-09-15 DIAGNOSIS — N170 Acute kidney failure with tubular necrosis: Secondary | ICD-10-CM | POA: Diagnosis not present

## 2020-09-15 DIAGNOSIS — U071 COVID-19: Secondary | ICD-10-CM | POA: Diagnosis not present

## 2020-09-15 DIAGNOSIS — J189 Pneumonia, unspecified organism: Secondary | ICD-10-CM | POA: Diagnosis not present

## 2020-09-15 DIAGNOSIS — G9340 Encephalopathy, unspecified: Secondary | ICD-10-CM | POA: Diagnosis not present

## 2020-09-15 LAB — CBC WITH DIFFERENTIAL/PLATELET
Abs Immature Granulocytes: 0.17 10*3/uL — ABNORMAL HIGH (ref 0.00–0.07)
Basophils Absolute: 0.1 10*3/uL (ref 0.0–0.1)
Basophils Relative: 1 %
Eosinophils Absolute: 0.1 10*3/uL (ref 0.0–0.5)
Eosinophils Relative: 1 %
HCT: 32.4 % — ABNORMAL LOW (ref 36.0–46.0)
Hemoglobin: 10.2 g/dL — ABNORMAL LOW (ref 12.0–15.0)
Immature Granulocytes: 2 %
Lymphocytes Relative: 17 %
Lymphs Abs: 1.3 10*3/uL (ref 0.7–4.0)
MCH: 25.2 pg — ABNORMAL LOW (ref 26.0–34.0)
MCHC: 31.5 g/dL (ref 30.0–36.0)
MCV: 80.2 fL (ref 80.0–100.0)
Monocytes Absolute: 0.5 10*3/uL (ref 0.1–1.0)
Monocytes Relative: 7 %
Neutro Abs: 5.7 10*3/uL (ref 1.7–7.7)
Neutrophils Relative %: 72 %
Platelets: 382 10*3/uL (ref 150–400)
RBC: 4.04 MIL/uL (ref 3.87–5.11)
RDW: 25.4 % — ABNORMAL HIGH (ref 11.5–15.5)
Smear Review: NORMAL
WBC: 7.8 10*3/uL (ref 4.0–10.5)
nRBC: 0 % (ref 0.0–0.2)

## 2020-09-15 LAB — BASIC METABOLIC PANEL
Anion gap: 12 (ref 5–15)
BUN: 21 mg/dL — ABNORMAL HIGH (ref 6–20)
CO2: 25 mmol/L (ref 22–32)
Calcium: 10 mg/dL (ref 8.9–10.3)
Chloride: 104 mmol/L (ref 98–111)
Creatinine, Ser: 0.78 mg/dL (ref 0.44–1.00)
GFR, Estimated: >60 mL/min (ref >60)
Glucose, Bld: 108 mg/dL — ABNORMAL HIGH (ref 70–99)
Potassium: 4 mmol/L (ref 3.5–5.1)
Sodium: 141 mmol/L (ref 135–145)

## 2020-09-15 LAB — GLUCOSE, CAPILLARY
Glucose-Capillary: 101 mg/dL — ABNORMAL HIGH (ref 70–99)
Glucose-Capillary: 114 mg/dL — ABNORMAL HIGH (ref 70–99)
Glucose-Capillary: 116 mg/dL — ABNORMAL HIGH (ref 70–99)
Glucose-Capillary: 135 mg/dL — ABNORMAL HIGH (ref 70–99)
Glucose-Capillary: 100 mg/dL — ABNORMAL HIGH (ref 70–99)

## 2020-09-15 LAB — PHOSPHORUS: Phosphorus: 3.8 mg/dL (ref 2.5–4.6)

## 2020-09-15 LAB — MAGNESIUM: Magnesium: 2.4 mg/dL (ref 1.7–2.4)

## 2020-09-15 MED ORDER — FLUCONAZOLE 100 MG PO TABS
100.0000 mg | ORAL_TABLET | ORAL | Status: DC
  Administered 2020-09-15 – 2020-09-18 (×4): 100 mg via ORAL
  Filled 2020-09-15 (×5): qty 1

## 2020-09-15 MED ORDER — ONDANSETRON HCL 4 MG PO TABS: 4.0000 mg | ORAL_TABLET | Freq: Four times a day (QID) | ORAL | Status: DC | PRN

## 2020-09-15 MED ORDER — TRAZODONE HCL 50 MG PO TABS
100.0000 mg | ORAL_TABLET | Freq: Every day | ORAL | Status: DC
  Administered 2020-09-15 – 2020-09-16 (×2): 100 mg via ORAL
  Filled 2020-09-15 (×5): qty 2

## 2020-09-15 MED ORDER — ONDANSETRON HCL 4 MG/2ML IJ SOLN: 4.0000 mg | Freq: Four times a day (QID) | INTRAMUSCULAR | Status: DC | PRN

## 2020-09-15 MED ORDER — ENOXAPARIN SODIUM 60 MG/0.6ML ~~LOC~~ SOLN
0.5000 mg/kg | SUBCUTANEOUS | Status: DC
  Administered 2020-09-15 – 2020-09-20 (×6): 55 mg via SUBCUTANEOUS
  Filled 2020-09-15 (×9): qty 0.6

## 2020-09-15 MED ORDER — PANTOPRAZOLE SODIUM 40 MG PO TBEC
40.0000 mg | DELAYED_RELEASE_TABLET | Freq: Two times a day (BID) | ORAL | Status: DC
  Administered 2020-09-15 – 2020-09-29 (×12): 40 mg via ORAL
  Filled 2020-09-15 (×16): qty 1

## 2020-09-15 MED ORDER — BOOST / RESOURCE BREEZE PO LIQD CUSTOM
1.0000 | Freq: Three times a day (TID) | ORAL | Status: DC
  Administered 2020-09-15 – 2020-09-23 (×3): 1 via ORAL

## 2020-09-15 MED ORDER — SIMETHICONE 40 MG/0.6ML PO SUSP
40.0000 mg | Freq: Four times a day (QID) | ORAL | Status: DC | PRN
  Filled 2020-09-15 (×2): qty 0.6

## 2020-09-15 MED ORDER — INSULIN ASPART 100 UNIT/ML ~~LOC~~ SOLN: 0.0000 [IU] | Freq: Three times a day (TID) | SUBCUTANEOUS | Status: DC

## 2020-09-15 NOTE — Progress Notes (Signed)
Physical Therapy Treatment Patient Details Name: Kathy Vega MRN: [id number] DOB: Sep 06, 1976 Today's Date: 09/15/2020    History of Present Illness Pt is a 44 y.o. female presenting to hospital 9/16 with SOB and confusion; initial O2 sats in 20's; pt COVID (+) 6 days prior.  Mechanically intubated 9/17.  S/p trach 10/8.  Pt admitted with acute hypoxic respiratory failure secondary COVID-19 PNA and ARDS, possible OSA with upper airway swelling s/p trach, and severe acute hypoxic and hypercapnic respiratory failure.  PMH includes anemia, thyroid disease, h/o leg surgery.    PT Comments    Pt was long sitting in bed upon arriving. She agrees to session with thumbs up and is easily able to communicate with white board and mouthing words. She was able to ambulate > 2 laps in hallway on 5 L o2 trach collar. No LOB or unsteadiness noted. Discussed rehab options at DC. She states she does not want to go to [place] to CIR 2/2 to being to far from home. Would like to find local SNF that is much closer to home. Author did discuss that is pt continues to progress as well as she has been, she may be able to be safely DC home with [facility] services. Will continue to progress as able per POC.     Follow Up Recommendations  CIR;SNF;Supervision/Assistance - 24 hour;Supervision for mobility/OOB     Equipment Recommendations  Rolling walker with 5" wheels;3in1 (PT)    Recommendations for Other Services       Precautions / Restrictions Precautions Precautions: Fall Precaution Comments: Trach (trach collar); HOB >30 degrees; Dobhoff Restrictions Weight Bearing Restrictions: No    Mobility  Bed Mobility Overal bed mobility: Modified Independent (HOB elevated, pt used bedrail) Bed Mobility: Supine to Sit;Sit to Supine     Supine to sit: Supervision;HOB elevated Sit to supine: Supervision;HOB elevated   General bed mobility comments: Increased time to perform. pt was able to exit and re-enter bed  without physical assist  Transfers Overall transfer level: Needs assistance Equipment used: Rolling walker (2 wheeled) Transfers: Sit to/from Stand Sit to Stand: Min guard;Min assist         General transfer comment: min assist from lowest bed height. CGA for elevated surfaces  Ambulation/Gait Ambulation/Gait assistance: Min guard;Supervision Gait Distance (Feet): 400 Feet Assistive device: Rolling walker (2 wheeled) (did pt not want to try without AD) Gait Pattern/deviations: WFL(Within Functional Limits);Narrow base of support Gait velocity: decreased   General Gait Details: pt was easily able to ambulate 2 + laps around RN station. HR elevated to 130s but with standing rest quickly resolves to 120s. Pt ambulated on 5 L trach collar with sao2> 94% throughout      Balance Overall balance assessment: Needs assistance Sitting-balance support: No upper extremity supported;Feet supported Sitting balance-Leahy Scale: Good Sitting balance - Comments: no LOB with close supervision   Standing balance support: Bilateral upper extremity supported;During functional activity Standing balance-Leahy Scale: Good Standing balance comment: no LOB with UE support         Cognition Arousal/Alertness: Awake/alert Behavior During Therapy: WFL for tasks assessed/performed Overall Cognitive Status: Within Functional Limits for tasks assessed    General Comments: pt using note pad, dry erase board, mouthing words, nodding head, or gestures during session             Pertinent Vitals/Pain Pain Assessment: No/denies pain Pain Score: 0-No pain Faces Pain Scale: No hurt Pain Intervention(s): Limited activity within patient's tolerance;Monitored during session  Home Living Family/patient expects to be discharged to:: Private residence                        PT Goals (current goals can now be found in the care plan section) Acute Rehab PT Goals Patient Stated Goal: none  stated Progress towards PT goals: Progressing toward goals    Frequency    7X/week      PT Plan Current plan remains appropriate       AM-PAC PT "6 Clicks" Mobility   Outcome Measure  Help needed turning from your back to your side while in a flat bed without using bedrails?: A Little Help needed moving from lying on your back to sitting on the side of a flat bed without using bedrails?: A Little Help needed moving to and from a bed to a chair (including a wheelchair)?: A Little Help needed standing up from a chair using your arms (e.g., wheelchair or bedside chair)?: A Little Help needed to walk in hospital room?: A Little Help needed climbing 3-5 steps with a railing? : A Little 6 Click Score: 18    End of Session Equipment Utilized During Treatment: Oxygen (5 L mobile trach collar) Activity Tolerance: Patient tolerated treatment well Patient left: in bed;with call bell/phone within reach;with bed alarm set Nurse Communication: Mobility status;Precautions;Other (comment) PT Visit Diagnosis: Other abnormalities of gait and mobility (R26.89);Muscle weakness (generalized) (M62.81);Difficulty in walking, not elsewhere classified (R26.2)     Time: 1425-1450 PT Time Calculation (min) (ACUTE ONLY): 25 min  Charges:  $Gait Training: 8-22 mins $Therapeutic Activity: 8-22 mins                     Julaine Fusi PTA 09/15/20, 4:33 PM

## 2020-09-15 NOTE — Progress Notes (Signed)
PROGRESS NOTE    Kathy Vega  QIW:[id number] DOB: March 21, 1976 DOA: 08/13/2020 PCP: [facility]    Brief Narrative:  44 yo unvaccinated female admitted with COVID-19 pneumonia & ARDS requiring intubation and mechanical ventilation.  09/16: Pt admitted to ICU requiring Heated HFNC and NRB, however remained in the ER pending ICU bed availability  09/16: CT Head revealed no acute intracranial abnormality 09/17: Pt with worsening hypoxia requiring mechanical intubation 09/18: [facility] refused to accept pt for ECMO 09/21: Attempted to transfer pt to [facility] and [facility] for possible assessment for ECMO, however both hospitals DO NOT have bed availability and are at capacity and pt may or may NOT be an ECMO candidate at this time  09/23: Pt unproned  09/24: Resumed proning protocol 09/25: Resumed proning protocol  09/26: Pt unproned 9/27-No onvernight events, patient remains in ARDS on elevated settings. 08/25/20- patient is on maximal settings with sedation. She is on day 11 of her illness with little improvement. Ive attempted to reach husband via cell no answer, ive successfully found her daughter and spoke with her regarding care plan and she is agreeable but requested for me to speak with [name] and has provided me with secondary phone number [phone] but again only voice mail was reachable. Have left voicemail and will discuss trache with family when they are available.  08/26/20-patient without overnight events, ive called POA husband again today but was unable to reach him and left voice mail.  08/27/20-patient critically ill with overall poor prognosis, she had desaturation event today and was difficult to improve requiring multiple changes on ventilator by RT.  08/28/20- Patient is weaned to 60% FiO2 on MV. Have called Garlan Fillers today and updated him. We discussed trache and he is agreeable.  08/29/20- Spoke with husband today,  answered questions ,updated on care plan, he is ready to speak with ENT regarding trache palcement 08/30/20-patient had SBT today she was awake and followed verbal communication but after 2 hours had severly labored breathing and thick high volume secretions. I have met with husband Garlan Fillers and he was able to see patient at bedside we discussed trache. I have updated Dr Tami Ribas regarding trache plan.  08/31/20- no airleak will need trach, discussed with Dr.Vaught. 09/01/20-Still no air leak. Patient has trach scheduled for Friday of this week and thus no need for steroids at this time 09/02/20- no air leak, continuing plan of care, no other concerns 09/03/20- very minimal air leak, continue with trach 10/8 09/04/20- Trach placed 09/07/20- Patient OOB with lift, weaned to 5/5, PICC & foley removed 09/08/20- tolerated 1 hr TC, OOB to chair without lift, pivoted on her own strength 09/09/20- patient not sleeping well O/N, bad dreams and overall feeling "down", tolerated TC 2 hours 09/11/20- patient is participating with PT while on trache collar and did well. Sutures removed. Will optimize for Stepdown status today.  FiO2 - 35%.  Pseudomonas on tracheal aspirate - plan for 7 d cefepime.  Trache size 8 switch to cuffed 6 today to allow for speech.  09/12/20- Patient stable, family came for visit. Continues to have clinical signs of improvement. Continuing current care plan. TOC consultation for post trache placement.  09/13/20- patient is improved, plan to optimize for [facility] transfer today. Will page out to [facility] for pick up this am.  10/19-did well with MBSS, no aspiration . Started on dysphagia 3 diet. Doboff tube d/c'd    Consultants:   pccm  Procedures:  ETT 9/17 >> 10/8 PICC 9/17 >> 10/11 Tracheostomy 10/8 >>  Metronidazole 9/24 >> 9/29 Cefepime 10/11 >>   Antimicrobials:  MRSA PCR 9/16 >> negative [facility] 9/16 >> negative Tracheal aspirate 10/11 >> Pseudomonas  aeruginosa   Subjective: Complaining of trach collar causing tightness.  Other complaints  Objective: Vitals:   09/14/20 1820 09/14/20 1937 09/15/20 0101 09/15/20 0745  BP: 112/78 99/65 109/78 121/70  Pulse:  94 (!) 102 93  Resp: (!) _0 Temp: 98.2 F (36.8 C) 98 F (36.7 C) 98 F (36.7 C) 98.2 F (36.8 C)  TempSrc: Oral Oral Oral Axillary  SpO2: 95% 95% 97% 97%  Weight:      Height:        Intake/Output Summary (Last 24 hours) at 09/15/2020 1142 Last data filed at 09/15/2020 0500 Gross per 24 hour  Intake --  Output 1000 ml  Net -1000 ml   Filed Weights   09/08/20 0447 09/09/20 0500 09/10/20 0500  Weight: 109.4 kg 109.1 kg 109.7 kg    Examination: Calm, comfortable Coarse breath sounds bilaterally, no rhonchi's or wheezing Regular S1-S2 no murmurs rubs Abdomen less distended than yesterday, positive bowel sounds nontender No edema Mood and affect appropriate for current setting Alert and oriented, grossly intact    Data Reviewed: I have personally reviewed following labs and imaging studies  CBC: Recent Labs  Lab 09/11/20 0606 09/12/20 0446 09/13/20 0523 09/14/20 0514 09/15/20 0530  WBC 7.9 8.5 9.3 9.2 7.8  NEUTROABS 6.2 6.8 7.2 7.0 5.7  HGB 9.7* 10.0* 10.0* 10.0* 10.2*  HCT 32.3* 32.7* 32.0* 32.7* 32.4*  MCV 82.4 81.3 80.8 80.3 80.2  PLT 319 347 351 378 779   Basic Metabolic Panel: Recent Labs  Lab 09/11/20 0606 09/12/20 0446 09/13/20 0523 09/14/20 0514 09/15/20 0530  NA 142 140 141 139 141  K 4.0 4.0 4.1 4.3 4.0  CL 108 105 105 103 104  CO2 _1 GLUCOSE 141* 129* 111* 117* 108*  BUN 26* 21* 21* 21* 21*  CREATININE 0.79 0.68 0.66 0.76 0.78  CALCIUM 9.7 9.7 9.7 9.9 10.0  MG 2.4 2.4 2.4 2.4 2.4  PHOS 2.8 2.7 3.2 3.2 3.8   GFR: Estimated Creatinine Clearance: 118.7 mL/min (by C-G formula based on SCr of 0.78 mg/dL). Liver Function Tests: No results for input(s): AST, ALT, ALKPHOS, BILITOT, PROT, ALBUMIN in the  last 168 hours. No results for input(s): LIPASE, AMYLASE in the last 168 hours. No results for input(s): AMMONIA in the last 168 hours. Coagulation Profile: No results for input(s): INR, PROTIME in the last 168 hours. Cardiac Enzymes: No results for input(s): CKTOTAL, CKMB, CKMBINDEX, TROPONINI in the last 168 hours. BNP (last 3 results) No results for input(s): PROBNP in the last 8760 hours. HbA1C: No results for input(s): HGBA1C in the last 72 hours. CBG: Recent Labs  Lab 09/14/20 1127 09/14/20 1710 09/14/20 2025 09/15/20 0100 09/15/20 0508  GLUCAP 113* 99 112* 101* 114*   Lipid Profile: No results for input(s): CHOL, HDL, LDLCALC, TRIG, CHOLHDL, LDLDIRECT in the last 72 hours. Thyroid Function Tests: No results for input(s): TSH, T4TOTAL, FREET4, T3FREE, THYROIDAB in the last 72 hours. Anemia Panel: No results for input(s): VITAMINB12, FOLATE, FERRITIN, TIBC, IRON, RETICCTPCT in the last 72 hours. Sepsis Labs: Recent Labs  Lab 09/09/20 0347  PROCALCITON 0.20    Recent Results (from the past 240 hour(s))  Culture, respiratory (non-expectorated)     Status: None  Collection Time: 09/07/20  4:02 PM   Specimen: Tracheal Aspirate; Respiratory  Result Value Ref Range Status   Specimen Description   Final    TRACHEAL ASPIRATE Performed at [facility], [address] [place], [place] [address]    Special Requests   Final    NONE Performed at [facility], [place], [place] [address]    Gram Stain   Final    RARE WBC PRESENT,BOTH PMN AND MONONUCLEAR FEW GRAM NEGATIVE RODS Performed at [facility], [place] [address], [place], [place] [address]    Culture RARE PSEUDOMONAS AERUGINOSA  Final   Report Status 09/09/2020 FINAL  Final   Organism ID, Bacteria PSEUDOMONAS AERUGINOSA  Final      Susceptibility   Pseudomonas aeruginosa - MIC*    CEFTAZIDIME 4 SENSITIVE Sensitive     CIPROFLOXACIN <=0.25 SENSITIVE Sensitive      GENTAMICIN <=1 SENSITIVE Sensitive     IMIPENEM 2 SENSITIVE Sensitive     PIP/TAZO 8 SENSITIVE Sensitive     CEFEPIME 2 SENSITIVE Sensitive     * RARE PSEUDOMONAS AERUGINOSA         Radiology Studies: DG Abd 1 View  Result Date: 09/14/2020 CLINICAL DATA:  Abdominal pain. EXAM: ABDOMEN - 1 VIEW COMPARISON:  09/07/2020 FINDINGS: A feeding tube remains in place and terminates in the expected region of the distal second portion of the duodenum, more distal than on the prior study. No dilated loops of bowel are seen to suggest obstruction. Cholecystectomy clips are noted. No acute osseous abnormality is identified. IMPRESSION: Nonobstructed bowel gas pattern. Electronically Signed   By: Logan Bores M.D.   On: 09/14/2020 16:52        Scheduled Meds: . chlorhexidine  15 mL Mouth Rinse BID  . Chlorhexidine Gluconate Cloth  6 each Topical Daily  . enoxaparin (LOVENOX) injection  40 mg Subcutaneous Q24H  . feeding supplement (PROSource TF)  45 mL Per Tube TID  . fluconazole  100 mg Oral Q24H  . insulin aspart  0-20 Units Subcutaneous Q4H  . mouth rinse  15 mL Mouth Rinse q12n4p  . nystatin  5 mL Oral QID  . pantoprazole  40 mg Oral BID AC  . traZODone  100 mg Oral QHS   Continuous Infusions: . sodium chloride Stopped (09/07/20 0725)  . feeding supplement (VITAL 1.5 CAL) 1,000 mL (09/13/20 0931)    Assessment & Plan:   Active Problems:   COVID-19   Acute renal failure (ARF) (HCC)   Elevated liver enzymes   CAP (community acquired pneumonia)   Acute encephalopathy   Hypokalemia   Acute respiratory failure (HCC)   Acute hypoxic respiratory failure secondary to COVID-19 pneumonia and ARDS Morbid obesity, possible OSA S/p course of remdesevir, azithromycin & ceftriaxone. Off airborne precautions. Tracheostomy placed 10/8. Cefepime started 10/11 for suspected HCAP-tracheal aspriate +pseudomonas Seroquel, Xanax and morphine was discontinued on 09/11/2020 Trazodone nightly as a  sleep aid 10/19- still feels trach collar is tight, asked nsg to have RT see pt about this . Will need sleep study as outpatient PT OT recommended CIR   Leukocytosis-possibly due to left lower lobe pneumonia PICC & foley removed- 10/11 Improved Will Continue with cefepime as tracheal aspirate was positive for Pseudomonas Monitor CBC   Troponin elevation-due to demand ischemia Hypertrophic cardiomyopathy Was treated with lasix Echo revealed EF 45-50% with severe concentric LVH, diastolic dysfunction    Mild acute renal failure -resolved Hypokalemia -resolved Hypernatremia -resolved -  All remained stable  Continue monitoring periodically  Avoid nephrotoxic agents    Anemia without obvious acute blood loss Last transfusion 9/23 H&H remained stable  Transfuse if hemoglobin less than 7    Hyperglycemia  Dobbhoff discontinued  Starting dysphagia 3 diet  Continue R-ISS    Nutrition-passed MBSS without any aspirations .Dobbhoff discontinued patient started on dysphagia 3 diet today     DVT prophylaxis: lovenox Code Status:full Family Communication: none at bedside  Status is: Inpatient  Remains inpatient appropriate because:IV treatments appropriate due to intensity of illness or inability to take PO   Dispo: The patient is from: Home              Anticipated d/c is to: CIR              Anticipated d/c date is: 3 days              Patient currently is not medically stable to d/c.Also needs CIR placement . On ivabx            LOS: 33 days   Time spent: 35 min with >50% on coc    Nolberto Hanlon, MD [facility] Pager [phone]  If 7PM-7AM, please contact night-coverage [email or web address] Password [email or web address] 09/15/2020, 11:42 AM

## 2020-09-15 NOTE — Evaluation (Addendum)
Objective Swallowing Evaluation: Type of Study: MBS-Modified Barium Swallow Study   Patient Details  Name: Kathy Vega MRN: [id number] Date of Birth: 31-Jan-1976  Today's Date: 09/15/2020 Time: SLP Start Time (ACUTE ONLY): 0845 -SLP Stop Time (ACUTE ONLY): 0955  SLP Time Calculation (min) (ACUTE ONLY): 70 min   Past Medical History:  Past Medical History:  Diagnosis Date  . Anemia   . Thyroid disease    Past Surgical History:  Past Surgical History:  Procedure Laterality Date  . CHOLECYSTECTOMY    . LEG SURGERY    . TRACHEOSTOMY TUBE PLACEMENT N/A 09/04/2020   Procedure: TRACHEOSTOMY;  Surgeon: Beverly Gust, MD;  Location: [facility] ORS;  Service: ENT;  Laterality: N/A;   HPI: (08/13/2020) Pt is a 44 yo female with a PMH of Hypothyroidism, morbid Obesity, and Anemia.  She presented to [facility] ER on 09/16 with c/o shortness of breath, headache, and confusion.  Upon arrival to the ER pts O2 sats were in the 20's she was placed on 5L of O2 via nasal canula, however her O2 sats only increased to 29%.  She was diagnosed with COVID-19 6 days prior to ER presentation, and she is unvaccinated.  She was placed on NRB and heated HFNC with increase in O2 sats to 93-95%.  Pt also noted to be confused and uncooperative likely secondary to taking a significant amount of cough syrup with codeine at home.  CT Head negative.  CXR revealed bilateral infiltrates.  PCCM team contacted for ICU admission.  She was orally intubated 08/14/2020 w/ OGT; traceheostomy 09/04/2020 w/ Dobhoff present for TFs support.  Pt has weaned to [facility] trials during the day w/ vent support at night as needed.    Subjective: pt alert, engaged. Eager to see if she can eat/drink.     Assessment / Plan / Recommendation  CHL IP CLINICAL IMPRESSIONS 09/15/2020  Clinical Impression Pt seen today for MBSS to assess oropharyngeal swallowing. Pt's Tracheostomy was placed on 09/04/2020 s/p several days of oral intubation d/t  Declined Pulmonary status d/t Covid19+. Pt has a Shiley #6XL, cuffed; cuff DEFLATED, pt on trach collar at Baseline, 5L. Finger occlusion and PMV placement have been unsuccessful d/t significantly reduced superiorly airflow; little to no redirected, superior airflow noted despite direction/cues/encouragement. Upon placing the PMV, pt was only able to tolerate ~30 seconds w/out discomfort and air stacking noted. Upon removing the PMV, a rush of airflow was noted at the level of the trach. Therefore, this study was done without use of PMV.  Pt exhibited a grossly adequate oropharyngeal phase swallow function w/ No gross oropharyngeal phase dysphagia noted. NO aspiration or laryngeal penetration noted to occur w/ any trial consistency during this study. During the pharyngeal phase, Timely pharyngeal swallow initiation noted w/ all consistencies; sufficient airway closure noted, epiglottic inversion. No pharyngeal residue remained indicating adequate pharyngeal pressure and laryngeal excursion during the swallow. During the oral phase, pt exhibited min lengthy oral phase timing w/ all boluses including thin liquids; increased prep time noted. Give Time, pt demonstrated appropriate bolus control, cohesion, and mastication of all consistencies. A-P transfer for swallow was complete w/ no significant oral residue remaining -- any slight amount was cleared w/ a f/u lingual sweep and swallow. Pt endorsed a dry mouth and min nervousness w/ first oral intake "in awhile". No cervical esophageal dysmotility noted. This study was completed w/ a Dobhoff tube in place.  MD consulted s/p this study for update on results and recommendations including removal of  Dobhoff and initiation of oral diet. Discussed general aspiration precautions w/ pt; education on food consistencies. ST services will f/u w/ toleration of diet/ed x1-2 as needed while admitted. Menu obtained for pt. NSG/Dietician updated.   SLP Visit Diagnosis Dysphagia,  unspecified (R13.10)  Attention and concentration deficit following --  Frontal lobe and executive function deficit following --  Impact on safety and function (No Data)      CHL IP TREATMENT RECOMMENDATION 09/15/2020  Treatment Recommendations Therapy as outlined in treatment plan below     Prognosis 09/15/2020  Prognosis for Safe Diet Advancement Good  Barriers to Reach Goals (No Data)  Barriers/Prognosis Comment --    CHL IP DIET RECOMMENDATION 09/15/2020  SLP Diet Recommendations Dysphagia 3 (Mech soft) solids;Thin liquid  Liquid Administration via Cup  Medication Administration Whole meds with puree  Compensations Minimize environmental distractions;Slow rate;Small sips/bites;Lingual sweep for clearance of pocketing;Follow solids with liquid  Postural Changes Remain semi-upright after after feeds/meals (Comment);Seated upright at 90 degrees      CHL IP OTHER RECOMMENDATIONS 09/15/2020  Recommended Consults (No Data)  Oral Care Recommendations Oral care BID;Oral care before and after PO;Patient independent with oral care  Other Recommendations (No Data)      CHL IP FOLLOW UP RECOMMENDATIONS 09/15/2020  Follow up Recommendations None      CHL IP FREQUENCY AND DURATION 09/15/2020  Speech Therapy Frequency (ACUTE ONLY) min 1 x/week  Treatment Duration 1 week           CHL IP ORAL PHASE 09/15/2020  Oral Phase Impaired  Oral - Pudding Teaspoon --  Oral - Pudding Cup --  Oral - Honey Teaspoon --  Oral - Honey Cup --  Oral - Nectar Teaspoon 1  Oral - Nectar Cup 1  Oral - Nectar Straw --  Oral - Thin Teaspoon 1  Oral - Thin Cup 6  Oral - Thin Straw --  Oral - Puree 3  Oral - Mech Soft 3  Oral - Regular --  Oral - Multi-Consistency --  Oral - Pill --  Oral Phase - Comment pt exhibited min lengthy oral phase timing w/ all boluses including thin liquids; increased prep time noted. Give Time, pt demonstrated appropriate bolus control, cohesion, and mastication of all  consistencies. A-P transfer for swallow was complete w/ no significant oral residue remaining -- any slight amount was cleared w/ a f/u lingual sweep and swallow. Pt endorsed a dry mouth and min nervousness w/ first oral intake "in awhile".     CHL IP PHARYNGEAL PHASE 09/15/2020  Pharyngeal Phase WFL  Pharyngeal- Pudding Teaspoon --  Pharyngeal --  Pharyngeal- Pudding Cup --  Pharyngeal --  Pharyngeal- Honey Teaspoon --  Pharyngeal --  Pharyngeal- Honey Cup --  Pharyngeal --  Pharyngeal- Nectar Teaspoon 1  Pharyngeal --  Pharyngeal- Nectar Cup 1  Pharyngeal --  Pharyngeal- Nectar Straw --  Pharyngeal --  Pharyngeal- Thin Teaspoon 1  Pharyngeal --  Pharyngeal- Thin Cup 6  Pharyngeal --  Pharyngeal- Thin Straw --  Pharyngeal --  Pharyngeal- Puree 3  Pharyngeal --  Pharyngeal- Mechanical Soft 3  Pharyngeal --  Pharyngeal- Regular --  Pharyngeal --  Pharyngeal- Multi-consistency --  Pharyngeal --  Pharyngeal- Pill --  Pharyngeal --  Pharyngeal Comment --     CHL IP CERVICAL ESOPHAGEAL PHASE 09/15/2020  Cervical Esophageal Phase WFL  Pudding Teaspoon --  Pudding Cup --  Honey Teaspoon --  Honey Cup --  Nectar Teaspoon --  Nectar Cup --  Nectar Straw --  Thin Teaspoon --  Thin Cup --  Thin Straw --  Puree --  Mechanical Soft --  Regular --  Multi-consistency --  Pill --  Cervical Esophageal Comment --         ,sign Trayquan Kolakowski 09/15/2020, 1:07 PM

## 2020-09-15 NOTE — Progress Notes (Signed)
Dobhoff tube removed without difficulty from patient's left nare per order.  Patient cleared for dysphagia 3 diet with thin liquids and meal tray delivered to room.

## 2020-09-15 NOTE — Progress Notes (Signed)
Nutrition Follow-up  DOCUMENTATION CODES:   Obesity unspecified  INTERVENTION:  Will discontinue tube feed orders.  Provide Boost Breeze po TID, each supplement provides 250 kcal and 9 grams of protein.  Provide Magic cup TID with meals, each supplement provides 290 kcal and 9 grams of protein.  Encouraged adequate intake of calories and protein from meals and oral nutrition supplements. Discussed increased needs for calories/protein.  NUTRITION DIAGNOSIS:   Increased nutrient needs related to catabolic illness (XOVAN-19) as evidenced by estimated needs.  New nutrition diagnosis.  GOAL:   Patient will meet greater than or equal to 90% of their needs  Progressing.  MONITOR:   PO intake, Supplement acceptance, Labs, Weight trends, I & O's  REASON FOR ASSESSMENT:   Ventilator, Consult Enteral/tube feeding initiation and management  ASSESSMENT:   44 year old female admitted with COVID-19 PNA.  9/17 intubated 10/8 s/p tracheostomy tube placement 10/18 transferred from ICU to med/surg 10/19 s/p MBSS: diet advanced to dysphagia 3 with thin liquids  Met with patient at bedside. Her Dobbhoff tube was removed today after MBSS. Patient is excited that she is able to eat now and that Dobbhoff has been removed. Her breakfast tray was at bedside. She had finished part of her eggs and hash browns and had some juice. Patient is amenable to RD ordering oral nutrition supplements for patient to help meet calorie protein needs. However, she does not want Ensure Enlive, Ensure Plus, or any other supplements similar to that as they remind her of the tube feeding per her report. After discussing options available on formulary patient is amenable to trying Boost Breeze supplements between meals and Magic Cup supplements on trays.  Medications reviewed and include: Diflucan, Novolog 0-20 units Q4hrs, nystatin, Protonix.  Labs reviewed: CBG 101-116, BUN 21.  No weight to trend since  10/14  Discussed with SLP.  Diet Order:   Diet Order            DIET DYS 3 Room service appropriate? Yes with Assist; Fluid consistency: Thin  Diet effective now                EDUCATION NEEDS:   No education needs have been identified at this time  Skin:  Skin Assessment: Skin Integrity Issues: Skin Integrity Issues:: DTI, Other (Comment) DTI: buttocks Other: blister to buttocks  Last BM:  09/15/2020 - medium type 6  Height:   Ht Readings from Last 1 Encounters:  09/04/20 5' 9.13" (1.756 m)   Weight:   Wt Readings from Last 1 Encounters:  09/10/20 109.7 kg   Ideal Body Weight:  65.9 kg  BMI:  Body mass index is 35.58 kg/m.  Estimated Nutritional Needs:   Kcal:  2600-2800  Protein:  145-155 grams  Fluid:  >/= 2 L/day  Jacklynn Barnacle, MS, RD, LDN Pager number available on Amion

## 2020-09-15 NOTE — Progress Notes (Signed)
PHARMACIST - PHYSICIAN COMMUNICATION  CONCERNING:  Enoxaparin (Lovenox) for DVT Prophylaxis    RECOMMENDATION: Patient was prescribed enoxaparin 40mg  q24 hours for VTE prophylaxis.   Filed Weights   09/08/20 0447 09/09/20 0500 09/10/20 0500  Weight: 109.4 kg (241 lb 2.9 oz) 109.1 kg (240 lb 8.4 oz) 109.7 kg (241 lb 13.5 oz)    Body mass index is 35.58 kg/m.  Estimated Creatinine Clearance: 118.7 mL/min (by C-G formula based on SCr of 0.78 mg/dL).   Based on [place] patient is candidate for enoxaparin 0.5mg /kg TBW SQ every 24 hours based on BMI being >30.   DESCRIPTION: Pharmacy has adjusted enoxaparin dose per [facility] policy.  Patient is now receiving enoxaparin 55 mg every 24 hours    Lu Duffel, PharmD, BCPS Clinical Pharmacist 09/15/2020 1:08 PM

## 2020-09-15 NOTE — Progress Notes (Signed)
Occupational Therapy Treatment Patient Details Name: Kathy Vega MRN: [id number] DOB: 1976-09-15 Today's Date: 09/15/2020    History of present illness Pt is a 44 y.o. female presenting to hospital 9/16 with SOB and confusion; initial O2 sats in 20's; pt COVID (+) 6 days prior.  Mechanically intubated 9/17.  S/p trach 10/8.  Pt admitted with acute hypoxic respiratory failure secondary COVID-19 PNA and ARDS, possible OSA with upper airway swelling s/p trach, and severe acute hypoxic and hypercapnic respiratory failure.  PMH includes anemia, thyroid disease, h/o leg surgery.   OT comments  Pt coming to sit on EOB with RN present in the room. Pt utilizing washcloth to wash face and hands with set up A. Pt with recent diet change and with breakfast tray in front of her. Pt demonstrating ability to open containers with B hands with increased time. Pt feed self with R hand and use of utensils with small bites and hard swallow noted. Pt drinking 2 containers of juice. OT educating pt on energy conservation and the likelihood of her consuming smaller meals throughout the day for needed calories to heal but also to conserve energy. Pt verbalized understanding and gestures "done" after taking a few bites of food but saves banana for later. Pt tolerate sitting for 25 minutes with close supervision while eating breakfast. Pt returning to supine with min guard and min cuing for hand placement and technique. Pt continues to use dry erase board and gestures for communication this session. Pt continues to benefit from acute OT intervention.    Follow Up Recommendations  CIR    Equipment Recommendations  Other (comment) (deferred to next venue of care)       Precautions / Restrictions Precautions Precautions: Fall Precaution Comments: Trach (trach collar); HOB >30 degrees; Dobhoff Restrictions Weight Bearing Restrictions: No       Mobility Bed Mobility Overal bed mobility: Needs Assistance Bed  Mobility: Sit to Supine       Sit to supine: Min guard   General bed mobility comments: min cuing for technique     Balance Overall balance assessment: Needs assistance Sitting-balance support: No upper extremity supported;Feet supported Sitting balance-Leahy Scale: Good Sitting balance - Comments: no LOB with close supervision          ADL either performed or assessed with clinical judgement   ADL Overall ADL's : Needs assistance/impaired Eating/Feeding: Supervision/ safety;Sitting   Grooming: Wash/dry hands;Wash/dry face;Sitting;Set up;Supervision/safety         Vision Patient Visual Report: No change from baseline            Cognition Arousal/Alertness: Awake/alert Behavior During Therapy: WFL for tasks assessed/performed Overall Cognitive Status: Within Functional Limits for tasks assessed     General Comments: pt using note pad, dry erase board, mouthing words, nodding head, or gestures during session                   Pertinent Vitals/ Pain       Pain Assessment: Faces Pain Score: 0-No pain  Home Living Family/patient expects to be discharged to:: Private residence                 Frequency  Min 2X/week        Progress Toward Goals  OT Goals(current goals can now be found in the care plan section)  Progress towards OT goals: Progressing toward goals  Acute Rehab OT Goals Patient Stated Goal: to get better OT Goal Formulation: With patient Time For Goal  Achievement: 09/21/20 Potential to Achieve Goals: Good  Plan Discharge plan remains appropriate       AM-PAC OT "6 Clicks" Daily Activity     Outcome Measure   Help from another person eating meals?: A Little Help from another person taking care of personal grooming?: A Little Help from another person toileting, which includes using toliet, bedpan, or urinal?: A Lot Help from another person bathing (including washing, rinsing, drying)?: A Lot Help from another person to put  on and taking off regular upper body clothing?: A Little Help from another person to put on and taking off regular lower body clothing?: A Lot 6 Click Score: 15    End of Session    OT Visit Diagnosis: Muscle weakness (generalized) (M62.81);Unsteadiness on feet (R26.81)   Activity Tolerance Patient tolerated treatment well   Patient Left in bed;with bed alarm set   Nurse Communication Precautions        Time: 4585-9292 OT Time Calculation (min): 38 min  Charges: OT General Charges $OT Visit: 1 Visit OT Treatments $Self Care/Home Management : 38-52 mins  Darleen Crocker, MS, OTR/L , CBIS ascom [phone]  09/15/20, 1:00 PM

## 2020-09-16 DIAGNOSIS — J189 Pneumonia, unspecified organism: Secondary | ICD-10-CM | POA: Diagnosis not present

## 2020-09-16 DIAGNOSIS — J9601 Acute respiratory failure with hypoxia: Secondary | ICD-10-CM | POA: Diagnosis not present

## 2020-09-16 DIAGNOSIS — U071 COVID-19: Secondary | ICD-10-CM | POA: Diagnosis not present

## 2020-09-16 LAB — GLUCOSE, CAPILLARY
Glucose-Capillary: 99 mg/dL (ref 70–99)
Glucose-Capillary: 91 mg/dL (ref 70–99)
Glucose-Capillary: 115 mg/dL — ABNORMAL HIGH (ref 70–99)
Glucose-Capillary: 91 mg/dL (ref 70–99)

## 2020-09-16 LAB — CBC WITH DIFFERENTIAL/PLATELET
Abs Immature Granulocytes: 0.13 10*3/uL — ABNORMAL HIGH (ref 0.00–0.07)
Basophils Absolute: 0.1 10*3/uL (ref 0.0–0.1)
Basophils Relative: 1 %
Eosinophils Absolute: 0.1 10*3/uL (ref 0.0–0.5)
Eosinophils Relative: 1 %
HCT: 32.4 % — ABNORMAL LOW (ref 36.0–46.0)
Hemoglobin: 9.9 g/dL — ABNORMAL LOW (ref 12.0–15.0)
Immature Granulocytes: 2 %
Lymphocytes Relative: 19 %
Lymphs Abs: 1.2 10*3/uL (ref 0.7–4.0)
MCH: 24.6 pg — ABNORMAL LOW (ref 26.0–34.0)
MCHC: 30.6 g/dL (ref 30.0–36.0)
MCV: 80.4 fL (ref 80.0–100.0)
Monocytes Absolute: 0.5 10*3/uL (ref 0.1–1.0)
Monocytes Relative: 9 %
Neutro Abs: 4.4 10*3/uL (ref 1.7–7.7)
Neutrophils Relative %: 68 %
Platelets: 357 10*3/uL (ref 150–400)
RBC: 4.03 MIL/uL (ref 3.87–5.11)
RDW: 24.7 % — ABNORMAL HIGH (ref 11.5–15.5)
Smear Review: NORMAL
WBC: 6.3 10*3/uL (ref 4.0–10.5)
nRBC: 0 % (ref 0.0–0.2)

## 2020-09-16 LAB — BASIC METABOLIC PANEL
Anion gap: 10 (ref 5–15)
BUN: 17 mg/dL (ref 6–20)
CO2: 27 mmol/L (ref 22–32)
Calcium: 10 mg/dL (ref 8.9–10.3)
Chloride: 101 mmol/L (ref 98–111)
Creatinine, Ser: 0.86 mg/dL (ref 0.44–1.00)
GFR, Estimated: >60 mL/min (ref >60)
Glucose, Bld: 112 mg/dL — ABNORMAL HIGH (ref 70–99)
Potassium: 3.8 mmol/L (ref 3.5–5.1)
Sodium: 138 mmol/L (ref 135–145)

## 2020-09-16 LAB — MAGNESIUM: Magnesium: 2.3 mg/dL (ref 1.7–2.4)

## 2020-09-16 LAB — PHOSPHORUS: Phosphorus: 3.7 mg/dL (ref 2.5–4.6)

## 2020-09-16 NOTE — Progress Notes (Signed)
PROGRESS NOTE    Kathy Vega   OZD:[id number]  DOB: [birth year]  [facility]P: [facility]    DOA: 08/13/2020 LOS: 36   Brief Narrative   "44 yo unvaccinated female admitted with COVID-19 pneumonia & ARDS requiring intubation and mechanical ventilation.   09/16: Pt admitted to ICU requiring Heated HFNC and NRB, however remained in the ER pending ICU bed availability  09/16: CT Head revealed no acute intracranial abnormality 09/17: Pt with worsening hypoxia requiring mechanical intubation 09/18: [facility] refused to accept pt for ECMO 09/21: Attempted to transfer [facility] to [facility] and [facility] for possible assessment for ECMO, however both hospitals DO NOT have bed availability and are at capacity and pt may or may NOT be an ECMO candidate at this time  09/23: Pt unproned  09/24:  Resumed proning protocol 09/25: Resumed proning protocol  09/26: Pt unproned 9/27-  No onvernight events, patient remains in ARDS on elevated settings. 08/25/20- patient is on maximal settings with sedation. She is on day 11 of her illness with little improvement.  Ive attempted to reach husband via cell no answer, ive successfully found her daughter and spoke with her regarding care plan and she is agreeable but requested for me to speak with [name] and has provided me with secondary phone number [phone] but again only voice mail was reachable.  Have left voicemail and will discuss trache with family when they are available.  08/26/20- patient without overnight events, ive called POA husband again today but was unable to reach him and left voice mail.  08/27/20- patient critically ill with overall poor prognosis, she had desaturation event today and was difficult to improve requiring multiple changes on ventilator by RT.  08/28/20- Patient is weaned to 60% FiO2 on MV.  Have called Garlan Fillers today and updated him. We discussed trache and he is agreeable.  08/29/20- Spoke with  husband today, answered questions ,updated on care plan,  he is ready to speak with ENT regarding trache palcement 08/30/20- patient had SBT today she was awake and followed verbal communication but after 2 hours had severly labored breathing and thick high volume secretions.  I have met with husband Garlan Fillers and he was able to see patient at bedside we discussed trache. I have updated Dr Tami Ribas regarding trache plan.  08/31/20 - no airleak will need trach, discussed with Dr.Vaught. 09/01/20-Still no air leak. Patient has trach scheduled for Friday of this week and thus no need for steroids at this time 09/02/20- no air leak, continuing plan of care, no other concerns 09/03/20- very minimal air leak, continue with trach 10/8 09/04/20- Trach placed 09/07/20- Patient OOB with lift, weaned to 5/5, PICC & foley removed 09/08/20- tolerated 1 hr TC, OOB to chair without lift, pivoted on her own strength 09/09/20- patient not sleeping well O/N, bad dreams and overall feeling "down", tolerated TC 2 hours 09/11/20- patient is participating with PT while on trache collar and did well. Sutures removed. Will optimize for Stepdown status today.  FiO2 - 35%.  Pseudomonas on tracheal aspirate - plan for 7 d cefepime.  Trache size 8 switch to cuffed 6 today to allow for speech.  09/12/20- Patient stable, family came for visit. Continues to have clinical signs of improvement. Continuing current care plan. TOC consultation for post trache placement.  09/13/20- patient is improved, plan to optimize for [facility] transfer today. Will page out to [facility] for pick up this am.   10/19-did  well with MBSS, no aspiration . Started on dysphagia 3 diet. Doboff tube d/c'd "     Assessment & Plan   Active Problems:   COVID-19   Acute renal failure (ARF) (HCC)   Elevated liver enzymes   CAP (community acquired pneumonia)   Acute encephalopathy   Hypokalemia   Acute respiratory failure (HCC)   Acute hypoxic respiratory  failure secondary to COVID-19 pneumonia and ARDS Morbid obesity, possible OSA S/p course of remdesevir, azithromycin & ceftriaxone. Off airborne precautions. Tracheostomy placed 10/8.  Cefepime started 10/11 for suspected HCAP-tracheal aspriate +pseudomonas Seroquel, Xanax and morphine was discontinued on 09/11/2020 Trazodone nightly as a sleep aid Will need sleep study as outpatient PT OT recommended CIR - pt initially declined to to distance from family, but is now agreeable after discussion on rounds this AM.  TOC aware and working on [employer].   Leukocytosis-possibly due to left lower lobe pneumonia PICC &foley removed- 10/11 Improved Will Continue with cefepime as tracheal aspirate was positive for Pseudomonas Monitor CBC   Troponin elevation-due to demand ischemia Hypertrophic cardiomyopathy Was treated with lasix Echo revealed EF 45-50% with severe concentric LVH, diastolic dysfunction    Mild acute renal failure -resolved Hypokalemia -resolved Hypernatremia -resolved -All remained stable  Continue monitoring periodically  Avoid nephrotoxic agents    Anemia without obvious acute blood loss Last transfusion 9/23 H&H remained stable  Monitor Hbg & transfuse if less than 7    Hyperglycemia  Dobbhoff discontinued  On dysphagia 3 diet  Continue R-ISS    Nutrition-passed MBSS without any aspirations .Dobbhoff discontinued.  Dysphagia 3 diet (tolerating well).    Obesity: Body mass index is 35.67 kg/m.  Complicates overall care and prognosis.  DVT prophylaxis: SCDs Start: 08/13/20 2035   Diet:  Diet Orders (From admission, onward)    Start     Ordered   09/15/20 0943  DIET DYS 3 Room service appropriate? Yes with Assist; Fluid consistency: Thin  Diet effective now       Comments: Extra Gravy on meats, potatoes. Cream Soups are preferred. Yogurts.  Question Answer Comment  Room service appropriate? Yes with Assist   Fluid consistency:  Thin      09/15/20 0944            Code Status: Full Code    Subjective 09/16/20    Pt seen this AM.  She reports ongoing productive cough, and needing suction.  No fevers or chills.  She asks when the trach can be removed.  We discussed rehab and patient eventually agreed with going to CIR for PT, OT and speech therapy.     Disposition Plan & Communication   Status is: Inpatient  Remains inpatient appropriate because:Inpatient level of care appropriate due to severity of illness.  Patient with tracheostomy, long complicated admission, overall weak and deconditioned.  Requires rehab upon discharge.  Continue inpatient close monitoring and respiratory care.   Dispo: The patient is from: Home              Anticipated d/c is to: CIR              Anticipated d/c date is: 2 days              Patient currently is medically stable to d/c.    Family Communication: husband and son at bedside during encounter this AM, 10/20.   Consults, Procedures, Significant Events   Consultants:   PCCM  Procedures:  ETT 9/17 >> 10/8 PICC  9/17 >> 10/11 Tracheostomy 10/8 >>   Antimicrobials:  Anti-infectives (From admission, onward)   Start     Dose/Rate Route Frequency Ordered Stop   09/15/20 1700  fluconazole (DIFLUCAN) tablet 100 mg        100 mg Oral Every 24 hours 09/15/20 1100     09/12/20 1700  fluconazole (DIFLUCAN) tablet 100 mg  Status:  Discontinued        100 mg Oral Every 24 hours 09/11/20 1346 09/12/20 0941   09/12/20 1700  fluconazole (DIFLUCAN) tablet 100 mg  Status:  Discontinued        100 mg Per Tube Every 24 hours 09/12/20 0941 09/15/20 1100   09/11/20 1400  fluconazole (DIFLUCAN) IVPB 100 mg        100 mg 50 mL/hr over 60 Minutes Intravenous Every 24 hours 09/11/20 1310 09/11/20 1458   09/07/20 1600  ceFEPIme (MAXIPIME) 2 g in sodium chloride 0.9 % 100 mL IVPB        2 g 200 mL/hr over 30 Minutes Intravenous Every 8 hours 09/07/20 1500 09/14/20 0720   08/21/20  2200  metroNIDAZOLE (FLAGYL) IVPB 500 mg        500 mg 100 mL/hr over 60 Minutes Intravenous Every 8 hours 08/21/20 2102 08/26/20 1535   08/14/20 1000  remdesivir 100 mg in sodium chloride 0.9 % 100 mL IVPB       "Followed by" Linked Group Details   100 mg 200 mL/hr over 30 Minutes Intravenous Daily 08/13/20 2040 08/17/20 1034   08/13/20 2230  azithromycin (ZITHROMAX) 500 mg in sodium chloride 0.9 % 250 mL IVPB  Status:  Discontinued        500 mg 250 mL/hr over 60 Minutes Intravenous Every 24 hours 08/13/20 2102 08/17/20 1021   08/13/20 2200  cefTRIAXone (ROCEPHIN) 2 g in sodium chloride 0.9 % 100 mL IVPB  Status:  Discontinued        2 g 200 mL/hr over 30 Minutes Intravenous Every 24 hours 08/13/20 2102 08/17/20 1021   08/13/20 2045  remdesivir 200 mg in sodium chloride 0.9% 250 mL IVPB       "Followed by" Linked Group Details   200 mg 580 mL/hr over 30 Minutes Intravenous Once 08/13/20 2040 08/14/20 0216         Objective   Vitals:   09/15/20 1657 09/15/20 1957 09/16/20 0500 09/16/20 0751  BP: 117/78 118/79  (!) 119/91  Pulse: 95 99  (!) 104  Resp: _0 Temp: 97.8 F (36.6 C) 97.6 F (36.4 C)  98.6 F (37 C)  TempSrc: Oral Oral  Axillary  SpO2: 100% 100%  100%  Weight:   110 kg   Height:        Intake/Output Summary (Last 24 hours) at 09/16/2020 0840 Last data filed at 09/15/2020 1300 Gross per 24 hour  Intake --  Output 200 ml  Net -200 ml   Filed Weights   09/09/20 0500 09/10/20 0500 09/16/20 0500  Weight: 109.1 kg 109.7 kg 110 kg    Physical Exam:  General exam: awake, alert, no acute distress HEENT: tracheostomy with trach collar oxygen 5 L/min, audible secretion sounds, hearing grossly normal  Respiratory system: upper airway secretion sounds referred diffusely, no wheezes, normal respiratory effort, productive sounding cough. Cardiovascular system: normal S1/S2, RRR, no pedal edema.   Central nervous system: A&O x3. no gross focal neurologic  deficits, normal speech Extremities: moves all, no edema, normal tone Skin: dry, intact,  normal temperature Psychiatry: normal mood, congruent affect, judgement and insight appear normal  Labs   Data Reviewed: I have personally reviewed following labs and imaging studies  CBC: Recent Labs  Lab 09/12/20 0446 09/13/20 0523 09/14/20 0514 09/15/20 0530 09/16/20 0627  WBC 8.5 9.3 9.2 7.8 6.3  NEUTROABS 6.8 7.2 7.0 5.7 PENDING  HGB 10.0* 10.0* 10.0* 10.2* 9.9*  HCT 32.7* 32.0* 32.7* 32.4* 32.4*  MCV 81.3 80.8 80.3 80.2 80.4  PLT 347 351 378 382 956   Basic Metabolic Panel: Recent Labs  Lab 09/12/20 0446 09/13/20 0523 09/14/20 0514 09/15/20 0530 09/16/20 0627  NA 140 141 139 141 138  K 4.0 4.1 4.3 4.0 3.8  CL 105 105 103 104 101  CO2 _0 GLUCOSE 129* 111* 117* 108* 112*  BUN 21* 21* 21* 21* 17  CREATININE 0.68 0.66 0.76 0.78 0.86  CALCIUM 9.7 9.7 9.9 10.0 10.0  MG 2.4 2.4 2.4 2.4 2.3  PHOS 2.7 3.2 3.2 3.8 3.7   GFR: Estimated Creatinine Clearance: 110.6 mL/min (by C-G formula based on SCr of 0.86 mg/dL). Liver Function Tests: No results for input(s): AST, ALT, ALKPHOS, BILITOT, PROT, ALBUMIN in the last 168 hours. No results for input(s): LIPASE, AMYLASE in the last 168 hours. No results for input(s): AMMONIA in the last 168 hours. Coagulation Profile: No results for input(s): INR, PROTIME in the last 168 hours. Cardiac Enzymes: No results for input(s): CKTOTAL, CKMB, CKMBINDEX, TROPONINI in the last 168 hours. BNP (last 3 results) No results for input(s): PROBNP in the last 8760 hours. HbA1C: No results for input(s): HGBA1C in the last 72 hours. CBG: Recent Labs  Lab 09/15/20 0508 09/15/20 1242 09/15/20 1658 09/15/20 1959 09/16/20 0819  GLUCAP 114* 116* 135* 100* 99   Lipid Profile: No results for input(s): CHOL, HDL, LDLCALC, TRIG, CHOLHDL, LDLDIRECT in the last 72 hours. Thyroid Function Tests: No results for input(s): TSH, T4TOTAL, FREET4,  T3FREE, THYROIDAB in the last 72 hours. Anemia Panel: No results for input(s): VITAMINB12, FOLATE, FERRITIN, TIBC, IRON, RETICCTPCT in the last 72 hours. Sepsis Labs: No results for input(s): PROCALCITON, LATICACIDVEN in the last 168 hours.  Recent Results (from the past 240 hour(s))  Culture, respiratory (non-expectorated)     Status: None   Collection Time: 09/07/20  4:02 PM   Specimen: Tracheal Aspirate; Respiratory  Result Value Ref Range Status   Specimen Description   Final    TRACHEAL ASPIRATE Performed at [facility], [address], [place], [place] [address]    Special Requests   Final    NONE Performed at [facility], [place], [place] [address]    Gram Stain   Final    RARE WBC PRESENT,BOTH PMN AND MONONUCLEAR FEW GRAM NEGATIVE RODS Performed at [facility], [place] [address] [place], [place] [address]    Culture RARE PSEUDOMONAS AERUGINOSA  Final   Report Status 09/09/2020 FINAL  Final   Organism ID, Bacteria PSEUDOMONAS AERUGINOSA  Final      Susceptibility   Pseudomonas aeruginosa - MIC*    CEFTAZIDIME 4 SENSITIVE Sensitive     CIPROFLOXACIN <=0.25 SENSITIVE Sensitive     GENTAMICIN <=1 SENSITIVE Sensitive     IMIPENEM 2 SENSITIVE Sensitive     PIP/TAZO 8 SENSITIVE Sensitive     CEFEPIME 2 SENSITIVE Sensitive     * RARE PSEUDOMONAS AERUGINOSA      Imaging Studies   DG Abd 1 View  Result  Date: 09/14/2020 CLINICAL DATA:  Abdominal pain. EXAM: ABDOMEN - 1 VIEW COMPARISON:  09/07/2020 FINDINGS: A feeding tube remains in place and terminates in the expected region of the distal second portion of the duodenum, more distal than on the prior study. No dilated loops of bowel are seen to suggest obstruction. Cholecystectomy clips are noted. No acute osseous abnormality is identified. IMPRESSION: Nonobstructed bowel gas pattern. Electronically Signed   By: Logan Bores M.D.   On: 09/14/2020 16:52     Medications    Scheduled Meds: . enoxaparin (LOVENOX) injection  0.5 mg/kg Subcutaneous Q24H  . feeding supplement  1 Container Oral TID BM  . fluconazole  100 mg Oral Q24H  . insulin aspart  0-20 Units Subcutaneous TID AC & HS  . mouth rinse  15 mL Mouth Rinse q12n4p  . nystatin  5 mL Oral QID  . pantoprazole  40 mg Oral BID AC  . traZODone  100 mg Oral QHS   Continuous Infusions: . sodium chloride Stopped (09/07/20 0725)       LOS: 34 days    Time spent: 30 minutes    Ezekiel Slocumb, DO [facility]  09/16/2020, 8:40 AM    If 7PM-7AM, please contact night-coverage. How to contact the [facility] Attending or Consulting provider [place] or covering provider during after hours [place], for this patient?    1. Check the care team in [facility] and look for a) attending/consulting [facility] provider listed and b) the [facility] team listed 2. Log into [email or web address] and use Ali Molina's universal password to access. If you do not have the password, please contact the hospital operator. 3. Locate the [facility] provider you are looking for under [facility] and page to a number that you can be directly reached. 4. If you still have difficulty reaching the provider, please page the [facility] (Director on Call) for the Hospitalists listed on amion for assistance.

## 2020-09-16 NOTE — Progress Notes (Addendum)
Speech Language Pathology Treatment: Dysphagia;Passy Muir Speaking valve  Patient Details Name: Kathy Vega MRN: [id number] DOB: 1976-07-05 Today's Date: 09/16/2020 Time: 1215-1300 SLP Time Calculation (min) (ACUTE ONLY): 45 min  Assessment / Plan / Recommendation Clinical Impression  Pt seen for ongoing assessment of swallowing s/p MBSS yesterday and initiation of Mec Soft consistency diet w/ thin liquids. She appears to continue to improve in her strength and engagement each day; engaged and responsive in conversation mouthing and writing on board d/t Aphonia secondary to tracheostomy(unable to wear the PMV at this time d/t poor toleration). Pt is on Trach Collar O2 support; wbc wnl.  Discussed w/ pt general aspiration precautions as recommended post MBSS; she agreed verbally to the need for following them especially sitting upright for all oral intake and taking small, single bites/sips. Pt already sitting upright for oral intake of thin liquids. No overt clinical s/s of aspiration were noted w/ trials; respiratory status remained calm and unlabored, O2 sats and RR/HR remained at her baseline. Pt followed precautions when drinking -- single, small sips slowly. Discussed pt's foods, options, and prep of such to make them soft and easy to masticate/swallow. Recommend gravies, condiments to moisten; Soups.   Recommend continue a Dysphagia level 3 diet (mech soft for the Cut meats, cooked foods) w/ gravies added to moisten foods; Thin liquids. Recommend general aspiration precautions; Pills in Puree for ease of swallowing/clearing; positioning assistance as needed for meals. ST services will be available if new swallowing needs arise while admitted. NSG updated. Precautions posted at bedside. Handouts given.   Pt seen today for ongoing PMV assessment; A/O x4 and using writing and/or mouthing and gestures for communication w/ others. Pt's Tracheostomy was placed on 09/04/2020.Pt was explained the PMV,  its use for communication, and its care again. Discussed the change in air flow w/ a tracheostomy and purpose of the PMV for verbal communication as well as its role in swallowing. Pt has a Shiley #6XL,cuffed; cuff DEFLATED, pt on trach collar at Baseline. Finger occlusion was attempted initially and coughing encouraged to clear min+ mucous/phlegm(tracheal). Slight/min redirected, superior airflow noted only. Upon placing the PMV, pt was only able to tolerate ~30-45 seconds w/out discomfort and air stacking noted. Upon removing the PMV, a rush of airflow was noted at the level of the trach. This was attempted x2. PMV was removed. Pt endorsed discomfort in breathing; unable to exhale sufficiently. O2 sats and RR/HR remained at her baseline during/post removal of PMV. Discussion was had w/ pt re: inability to tolerate wearing of the PMV and potential issues such as Edema(as per MD consultation); NSG agreed. Pt is able to redirect slight/min airflow today which is an improvement from previous days. Pt's improved medical status overall bodes well for improvement for toleration of PMV placement/wear. Thrush txs ongoing. Discussed frequent oral care(practiced w/ model). ST services will continue to follow for monitoring of appropriateness for PMV use/wear. NSG updated. Pt agreed.     HPI HPI: (08/13/2020) Pt is a 44 yo female with a PMH of Hypothyroidism, morbid Obesity, and Anemia.  She presented to [facility] ER on 09/16 with c/o shortness of breath, headache, and confusion.  Upon arrival to the ER pts O2 sats were in the 20's she was placed on 5L of O2 via nasal canula, however her O2 sats only increased to 29%.  She was diagnosed with COVID-19 6 days prior to ER presentation, and she is unvaccinated.  She was placed on NRB and heated HFNC with  increase in O2 sats to 93-95%.  Pt also noted to be confused and uncooperative likely secondary to taking a significant amount of cough syrup with codeine at home.  CT Head  negative.  CXR revealed bilateral infiltrates.  PCCM team contacted for ICU admission.  She was orally intubated 08/14/2020 w/ OGT; traceheostomy 09/04/2020 w/ Dobhoff present for TFs support.  Pt has weaned to [facility] trials during the day w/ vent support at night as needed.       SLP Plan  Continue with current plan of care (all goals met for swallowing)       Recommendations  Diet recommendations: Dysphagia 3 (mechanical soft);Thin liquid Liquids provided via: Cup;Straw (monitor) Medication Administration: Crushed with puree (for ease of swallowing/clearing as needed) Supervision: Patient able to self feed Compensations: Minimize environmental distractions;Slow rate;Small sips/bites;Lingual sweep for clearance of pocketing;Follow solids with liquid Postural Changes and/or Swallow Maneuvers: Seated upright 90 degrees;Upright 30-60 min after meal      Patient may use Passy-Muir Speech Valve: with SLP only PMSV Supervision: Full         General recommendations:  (PT/OT; dietician f/u) Oral Care Recommendations: Oral care BID;Oral care before and after PO;Patient independent with oral care Follow up Recommendations: Inpatient Rehab (for PMV monitoring) SLP Visit Diagnosis: Aphonia (R49.1);Dysphagia, unspecified (R13.10) (tracheostomy) Plan: Continue with current plan of care (all goals met for swallowing)       GO                  Orinda Kenner, MS, CCC-SLP Speech Language Pathologist Rehab Services [phone] [facility] 09/16/2020, 1:09 PM

## 2020-09-16 NOTE — Progress Notes (Signed)
Physical Therapy Treatment Patient Details Name: Kathy Vega MRN: [id number] DOB: 19-Jul-1976 Today's Date: 09/16/2020    History of Present Illness Pt is a 44 y.o. female presenting to hospital 9/16 with SOB and confusion; initial O2 sats in 20's; pt COVID (+) 6 days prior.  Mechanically intubated 9/17.  S/p trach 10/8.  Pt admitted with acute hypoxic respiratory failure secondary COVID-19 PNA and ARDS, possible OSA with upper airway swelling s/p trach, and severe acute hypoxic and hypercapnic respiratory failure.  PMH includes anemia, thyroid disease, h/o leg surgery.    PT Comments    Pt was sitting on BSC upon arriving. Has successful BM and then agrees to PT session. Pt was on 4 L trach  collar throughout session with sao2 > 92%. Pt's son/spouse present throughout session. Was able to stand and ambulate one lap around RN station without AD however does have dynamic balance deficits present. Recommend use of RW with RN staff. Pt was completely independent prior to admission. Recommend CIR to address deficits and improve safe functional mobility and independence. She was sitting up in recliner post session with call bell in reach and chair alarm in place.     Follow Up Recommendations  CIR;Supervision/Assistance - 24 hour;Supervision for mobility/OOB     Equipment Recommendations  Rolling walker with 5" wheels;3in1 (PT)    Recommendations for Other Services       Precautions / Restrictions Precautions Precautions: Fall Precaution Comments: Trach (trach collar); HOB >30 degrees; Dobhoff Restrictions Weight Bearing Restrictions: No    Mobility  Bed Mobility Overal bed mobility: Modified Independent  General bed mobility comments: HOB elevated. Increased time to perform  Transfers Overall transfer level: Needs assistance Equipment used: 1 person hand held assist;None Transfers: Sit to/from Stand Sit to Stand: Min guard;Min assist    General transfer comment: Pt was able to  stand from lowest bed height with +1 HHA + min. Does demonstrate good eccentric control with stand to sit.   Ambulation/Gait Ambulation/Gait assistance: Min guard Gait Distance (Feet): 160 Feet Assistive device: None Gait Pattern/deviations: Narrow base of support;Shuffle;Drifts right/left Gait velocity: decreased   General Gait Details: Pt was able to ambulate one lap in hallway without AD. does have balance deficits and slight unsteadiness however overall tolerated well. She was on 4 L O2 FIO2 28%     Balance Overall balance assessment: Needs assistance Sitting-balance support: No upper extremity supported;Feet supported Sitting balance-Leahy Scale: Good     Standing balance support: No upper extremity supported;During functional activity Standing balance-Leahy Scale: Fair Standing balance comment: pt does have unsteadiness with dynamic activity        Cognition Arousal/Alertness: Awake/alert Behavior During Therapy: WFL for tasks assessed/performed;Anxious (slightly anxious at times but overall cooperative ) Overall Cognitive Status: Within Functional Limits for tasks assessed        General Comments: Pt is able to communicate via white board and mouthing words. gives thumbs up/down appropriately             Pertinent Vitals/Pain Pain Assessment: No/denies pain Pain Score: 0-No pain Faces Pain Scale: No hurt        PT Goals (current goals can now be found in the care plan section) Acute Rehab PT Goals Patient Stated Goal: go home Progress towards PT goals: Progressing toward goals    Frequency    7X/week    PT Plan Current plan remains appropriate       AM-PAC PT "6 Clicks" Mobility   Outcome Measure  Help needed turning from your back to your side while in a flat bed without using bedrails?: A Little Help needed moving from lying on your back to sitting on the side of a flat bed without using bedrails?: A Little Help needed moving to and from a bed to  a chair (including a wheelchair)?: A Little Help needed standing up from a chair using your arms (e.g., wheelchair or bedside chair)?: A Little Help needed to walk in hospital room?: A Little Help needed climbing 3-5 steps with a railing? : A Lot 6 Click Score: 17    End of Session Equipment Utilized During Treatment: Oxygen Activity Tolerance: Patient tolerated treatment well Patient left: in chair;with call bell/phone within reach;with chair alarm set Nurse Communication: Mobility status;Precautions;Other (comment) PT Visit Diagnosis: Other abnormalities of gait and mobility (R26.89);Muscle weakness (generalized) (M62.81);Difficulty in walking, not elsewhere classified (R26.2)     Time: 1350-1430 PT Time Calculation (min) (ACUTE ONLY): 40 min  Charges:  $Gait Training: 23-37 mins $Therapeutic Activity: 8-22 mins                     Julaine Fusi PTA 09/16/20, 3:27 PM

## 2020-09-16 NOTE — Hospital Course (Addendum)
44 yo unvaccinated female admitted on 08/13/20 with COVID-19 pneumonia & ARDS requiring intubation on 9/17 and mechanical ventilation.  While in ICU, attempts made to transfer to [facility] or [facility] for ECMO but no beds were available and it was not certain patient would be candidate.  Proning protocols were followed while in ICU.  Patient required maximal ventilation support with sedation and remained critically ill.  She was weaned to FiO2 60% on 10/1.  Tracheostomy was placed on 09/04/20.  Patient began tolerating trach collar O2 in short durations 10/12-13 and began OOB to chair.   Started working with PT on 10/15.  Treated with 7 days Cefepime due to tracheal aspirate culture growing Pseudomonas. Hospitalist service assumed care of patient on 10/18.   Evaluated by SLP and doing well with dysphagia 3 diet.    So far trach has not been able to be downsized since 10/15 when size 8 trach was changed to cuffed size 6.  Patient is still unable to pass enough air around trach yet to be able to use speech valve.  Per pulmonology and ENT, patient had very significant laryngeal edema, in addition to body habitus being a complicating factor with likely underlying sleep apnea and/or alveolar hypoventilation.

## 2020-09-16 NOTE — Progress Notes (Signed)
IP rehab admissions - Noted patient would like rehab closer to home and does not want [facility] CIR.  Patient is doing well with therapies and could consider home with [facility] therapies if she has home support or SNF placement closer to home.  She was able to ambulate 400' with min guard to supervision.  I will sign off for [facility] inpatient rehab at this time.  Call me for questions.  [phone]

## 2020-09-17 DIAGNOSIS — U071 COVID-19: Secondary | ICD-10-CM | POA: Diagnosis not present

## 2020-09-17 DIAGNOSIS — J9601 Acute respiratory failure with hypoxia: Secondary | ICD-10-CM | POA: Diagnosis not present

## 2020-09-17 LAB — GLUCOSE, CAPILLARY
Glucose-Capillary: 114 mg/dL — ABNORMAL HIGH (ref 70–99)
Glucose-Capillary: 103 mg/dL — ABNORMAL HIGH (ref 70–99)
Glucose-Capillary: 105 mg/dL — ABNORMAL HIGH (ref 70–99)
Glucose-Capillary: 107 mg/dL — ABNORMAL HIGH (ref 70–99)
Glucose-Capillary: 100 mg/dL — ABNORMAL HIGH (ref 70–99)

## 2020-09-17 NOTE — Progress Notes (Signed)
Physical Therapy Treatment Patient Details Name: Kathy Vega MRN: [id number] DOB: Sep 17, 1976 Today's Date: 09/17/2020    History of Present Illness Pt is a 44 y.o. female presenting to hospital 9/16 with SOB and confusion; initial O2 sats in 20's; pt COVID (+) 6 days prior.  Mechanically intubated 9/17.  S/p trach 10/8.  Pt admitted with acute hypoxic respiratory failure secondary COVID-19 PNA and ARDS, possible OSA with upper airway swelling s/p trach, and severe acute hypoxic and hypercapnic respiratory failure.  PMH includes anemia, thyroid disease, h/o leg surgery.    PT Comments    Pt was long sitting in bed upon arriving. She agrees to PT session and si cooperative and pleasant throughout. She just had returned to bed from [place] getting on/off BSC. Pt was on 4 L 28 % fio2 throughout PT session. She was able to exit R side of bed, stand, and ambulate in hallway with CGA. Did have one occasion of LOB in hallway with intervention. HR elevated to 140s with slightly elevated RR however pt reports feeling "fine."Overall pt continue to progress well towards all PT goals however would greatly benefit from continued skilled PT to address higher level balance and safety deficits. Acute PT will continue to follow per currently POC.    Follow Up Recommendations  CIR;Supervision/Assistance - 24 hour;Supervision for mobility/OOB     Equipment Recommendations  Rolling walker with 5" wheels;3in1 (PT)    Recommendations for Other Services       Precautions / Restrictions Precautions Precautions: Fall Precaution Comments: Trach (trach collar); HOB >30 degrees; Dobhoff Restrictions Weight Bearing Restrictions: No    Mobility  Bed Mobility Overal bed mobility: Independent             General bed mobility comments: pt has just returned to bed from [place] getting on/off BSc without assistances. discussed importance of calling for assistance for safety  Transfers Overall transfer level:  Needs assistance Equipment used: None Transfers: Sit to/from Stand Sit to Stand: Min guard         General transfer comment: CGA for safety. vcs for improved technique and fwd wt shift. stood from bed at lowest height  Ambulation/Gait Ambulation/Gait assistance: Min guard Gait Distance (Feet): 200 Feet Assistive device: None Gait Pattern/deviations: Narrow base of support;Shuffle;Drifts right/left Gait velocity: decreased   General Gait Details: pt did have one occasuion of LOB with intervention to prevent fall. CGA throughout mostly with slow cadenace        Balance Overall balance assessment: Needs assistance Sitting-balance support: No upper extremity supported;Feet supported Sitting balance-Leahy Scale: Good     Standing balance support: No upper extremity supported;During functional activity Standing balance-Leahy Scale: Fair Standing balance comment: pt does have unsteadiness with dynamic activity        Cognition Arousal/Alertness: Awake/alert Behavior During Therapy: WFL for tasks assessed/performed Overall Cognitive Status: Within Functional Limits for tasks assessed      General Comments: Pt is able to communicate via white board and mouthing words. gives thumbs up/down appropriately       Pertinent Vitals/Pain Pain Assessment: No/denies pain Pain Score: 0-No pain Faces Pain Scale: No hurt Pain Location: trach site and NG tube (back of throat) discomfort Pain Descriptors / Indicators: Sore Pain Intervention(s): Limited activity within patient's tolerance;Monitored during session           PT Goals (current goals can now be found in the care plan section) Progress towards PT goals: Progressing toward goals    Frequency  7X/week      PT Plan Current plan remains appropriate       AM-PAC PT "6 Clicks" Mobility   Outcome Measure  Help needed turning from your back to your side while in a flat bed without using bedrails?: A Little Help  needed moving from lying on your back to sitting on the side of a flat bed without using bedrails?: A Little Help needed moving to and from a bed to a chair (including a wheelchair)?: A Little Help needed standing up from a chair using your arms (e.g., wheelchair or bedside chair)?: A Little Help needed to walk in hospital room?: A Little Help needed climbing 3-5 steps with a railing? : A Little 6 Click Score: 18    End of Session Equipment Utilized During Treatment: Oxygen Activity Tolerance: Patient tolerated treatment well Patient left: in chair;with call bell/phone within reach;with chair alarm set Nurse Communication: Mobility status PT Visit Diagnosis: Other abnormalities of gait and mobility (R26.89);Muscle weakness (generalized) (M62.81);Difficulty in walking, not elsewhere classified (R26.2)     Time: 4709-2957 PT Time Calculation (min) (ACUTE ONLY): 48 min  Charges:  $Gait Training: 23-37 mins $Therapeutic Activity: 8-22 mins                     Julaine Fusi PTA 09/17/20, 12:25 PM

## 2020-09-17 NOTE — Progress Notes (Signed)
PROGRESS NOTE    Kathy Vega   KWI:[id number]  DOB: [birth year]  PCP: [facility] [place]    DOA: 08/13/2020 LOS: 4   Brief Narrative   "44 yo unvaccinated female admitted with COVID-19 pneumonia & ARDS requiring intubation and mechanical ventilation.   09/16: Pt admitted to ICU requiring Heated HFNC and NRB, however remained in the ER pending ICU bed availability  09/16: CT Head revealed no acute intracranial abnormality 09/17: Pt with worsening hypoxia requiring mechanical intubation 09/18: [facility] refused to accept pt for ECMO 09/21: Attempted to transfer pt to [facility] and [facility] for possible assessment for ECMO, however both hospitals DO NOT have bed availability and are at capacity and pt may or may NOT be an ECMO candidate at this time  09/23: Pt unproned  09/24:  Resumed proning protocol 09/25: Resumed proning protocol  09/26: Pt unproned 9/27-  No onvernight events, patient remains in ARDS on elevated settings. 08/25/20- patient is on maximal settings with sedation. She is on day 11 of her illness with little improvement.  Ive attempted to reach husband via cell no answer, ive successfully found her daughter and spoke with her regarding care plan and she is agreeable but requested for me to speak with [name] and has provided me with secondary phone number [phone] but again only voice mail was reachable.  Have left voicemail and will discuss trache with family when they are available.  08/26/20- patient without overnight events, ive called POA husband again today but was unable to reach him and left voice mail.  08/27/20- patient critically ill with overall poor prognosis, she had desaturation event today and was difficult to improve requiring multiple changes on ventilator by RT.  08/28/20- Patient is weaned to 60% FiO2 on MV.  Have called Garlan Fillers today and updated him. We discussed trache and he is agreeable.  08/29/20- Spoke with  husband today, answered questions ,updated on care plan,  he is ready to speak with ENT regarding trache palcement 08/30/20- patient had SBT today she was awake and followed verbal communication but after 2 hours had severly labored breathing and thick high volume secretions.  I have met with husband Garlan Fillers and he was able to see patient at bedside we discussed trache. I have updated Dr Tami Ribas regarding trache plan.  08/31/20 - no airleak will need trach, discussed with Dr.Vaught. 09/01/20-Still no air leak. Patient has trach scheduled for Friday of this week and thus no need for steroids at this time 09/02/20- no air leak, continuing plan of care, no other concerns 09/03/20- very minimal air leak, continue with trach 10/8 09/04/20- Trach placed 09/07/20- Patient OOB with lift, weaned to 5/5, PICC & foley removed 09/08/20- tolerated 1 hr TC, OOB to chair without lift, pivoted on her own strength 09/09/20- patient not sleeping well O/N, bad dreams and overall feeling "down", tolerated TC 2 hours 09/11/20- patient is participating with PT while on trache collar and did well. Sutures removed. Will optimize for Stepdown status today.  FiO2 - 35%.  Pseudomonas on tracheal aspirate - plan for 7 d cefepime.  Trache size 8 switch to cuffed 6 today to allow for speech.  09/12/20- Patient stable, family came for visit. Continues to have clinical signs of improvement. Continuing current care plan. TOC consultation for post trache placement.  09/13/20- patient is improved, plan to optimize for [facility] transfer today. Will page out to [facility] for pick up this am.   10/19-did  well with MBSS, no aspiration . Started on dysphagia 3 diet. Doboff tube d/c'd "     Assessment & Plan   Active Problems:   COVID-19   Acute renal failure (ARF) (HCC)   Elevated liver enzymes   CAP (community acquired pneumonia)   Acute encephalopathy   Hypokalemia   Acute respiratory failure (HCC)   Acute hypoxic respiratory  failure secondary to COVID-19 pneumonia and ARDS Morbid obesity, possible OSA S/p course of remdesevir, azithromycin & ceftriaxone. Off airborne precautions. Tracheostomy placed 10/8.  Cefepime started 10/11 for suspected HCAP-tracheal aspriate +pseudomonas Seroquel, Xanax and morphine was discontinued on 09/11/2020 Trazodone nightly as a sleep aid Will need sleep study as outpatient PT OT recommended CIR - pt initially declined to to distance from family, but is now agreeable after discussion on rounds this AM.  TOC aware and working on [employer] fast streamer.   Leukocytosis-possibly due to left lower lobe pneumonia PICC &foley removed- 10/11 Improved Will Continue with cefepime as tracheal aspirate was positive for Pseudomonas Monitor CBC   Troponin elevation-due to demand ischemia Hypertrophic cardiomyopathy Was treated with lasix Echo revealed EF 45-50% with severe concentric LVH, diastolic dysfunction    Mild acute renal failure -resolved Hypokalemia -resolved Hypernatremia -resolved -All remained stable  Continue monitoring periodically  Avoid nephrotoxic agents    Anemia without obvious acute blood loss Last transfusion 9/23 H&H remained stable  Monitor Hbg & transfuse if less than 7    Hyperglycemia  Dobbhoff discontinued  On dysphagia 3 diet  Continue R-ISS    Nutrition-passed MBSS without any aspirations .Dobbhoff discontinued.  Dysphagia 3 diet (tolerating well).    Obesity: Body mass index is 35.84 kg/m.  Complicates overall care and prognosis.  DVT prophylaxis: SCDs Start: 08/13/20 2035   Diet:  Diet Orders (From admission, onward)    Start     Ordered   09/15/20 0943  DIET DYS 3 Room service appropriate? Yes with Assist; Fluid consistency: Thin  Diet effective now       Comments: Extra Gravy on meats, potatoes. Cream Soups are preferred. Yogurts.  Question Answer Comment  Room service appropriate? Yes with Assist   Fluid consistency:  Thin      09/15/20 0944            Code Status: Full Code    Subjective 09/17/20    Pt seen this AM up in chair.  Seen walking on unit with PT earlier, says she felt okay.  She does want to know plan for trach.  Denies fever, chills, SOB, CP, N/V or other complaints.    Disposition Plan & Communication   Status is: Inpatient  Remains inpatient appropriate because:Inpatient level of care appropriate due to severity of illness.  Patient with tracheostomy, long complicated admission, overall weak and deconditioned. PT recommending CIR, insurance authorization underway.  Continue inpatient close monitoring and respiratory care.   Dispo: The patient is from: Home              Anticipated d/c is to: CIR              Anticipated d/c date is: 1 days              Patient currently is medically stable to d/c.    Family Communication: None at bedside on rounds today, will attempt to call.  Husband and son at bedside during encounter 10/20.   Consults, Procedures, Significant Events   Consultants:   PCCM  Procedures:  ETT 9/17 >>  10/8 PICC 9/17 >> 10/11 Tracheostomy 10/8 >>   Antimicrobials:  Anti-infectives (From admission, onward)   Start     Dose/Rate Route Frequency Ordered Stop   09/15/20 1700  fluconazole (DIFLUCAN) tablet 100 mg        100 mg Oral Every 24 hours 09/15/20 1100     09/12/20 1700  fluconazole (DIFLUCAN) tablet 100 mg  Status:  Discontinued        100 mg Oral Every 24 hours 09/11/20 1346 09/12/20 0941   09/12/20 1700  fluconazole (DIFLUCAN) tablet 100 mg  Status:  Discontinued        100 mg Per Tube Every 24 hours 09/12/20 0941 09/15/20 1100   09/11/20 1400  fluconazole (DIFLUCAN) IVPB 100 mg        100 mg 50 mL/hr over 60 Minutes Intravenous Every 24 hours 09/11/20 1310 09/11/20 1458   09/07/20 1600  ceFEPIme (MAXIPIME) 2 g in sodium chloride 0.9 % 100 mL IVPB        2 g 200 mL/hr over 30 Minutes Intravenous Every 8 hours 09/07/20 1500 09/14/20 0720    08/21/20 2200  metroNIDAZOLE (FLAGYL) IVPB 500 mg        500 mg 100 mL/hr over 60 Minutes Intravenous Every 8 hours 08/21/20 2102 08/26/20 1535   08/14/20 1000  remdesivir 100 mg in sodium chloride 0.9 % 100 mL IVPB       "Followed by" Linked Group Details   100 mg 200 mL/hr over 30 Minutes Intravenous Daily 08/13/20 2040 08/17/20 1034   08/13/20 2230  azithromycin (ZITHROMAX) 500 mg in sodium chloride 0.9 % 250 mL IVPB  Status:  Discontinued        500 mg 250 mL/hr over 60 Minutes Intravenous Every 24 hours 08/13/20 2102 08/17/20 1021   08/13/20 2200  cefTRIAXone (ROCEPHIN) 2 g in sodium chloride 0.9 % 100 mL IVPB  Status:  Discontinued        2 g 200 mL/hr over 30 Minutes Intravenous Every 24 hours 08/13/20 2102 08/17/20 1021   08/13/20 2045  remdesivir 200 mg in sodium chloride 0.9% 250 mL IVPB       "Followed by" Linked Group Details   200 mg 580 mL/hr over 30 Minutes Intravenous Once 08/13/20 2040 08/14/20 0216         Objective   Vitals:   09/17/20 0450 09/17/20 0500 09/17/20 0905 09/17/20 1155  BP: 111/82  (!) 134/106 130/83  Pulse: (!) 102  95 (!) 109  Resp: 16  15 19   Temp: 98.1 F (36.7 C)  98.3 F (36.8 C) 98.1 F (36.7 C)  TempSrc: Oral  Oral Oral  SpO2: 97%  100% 99%  Weight:  110.5 kg    Height:        Intake/Output Summary (Last 24 hours) at 09/17/2020 1408 Last data filed at 09/17/2020 0900 Gross per 24 hour  Intake 240 ml  Output --  Net 240 ml   Filed Weights   09/10/20 0500 09/16/20 0500 09/17/20 0500  Weight: 109.7 kg 110 kg 110.5 kg    Physical Exam:  General exam: awake, alert, no acute distress HEENT: tracheostomy with trach collar oxygen, hearing grossly normal  Respiratory system: CTAB overall, diminished bases, normal respiratory effort Cardiovascular system: normal S1/S2, RRR, no peripheral edema.   Central nervous system: A&O x3. no gross focal neurologic deficits    Labs   Data Reviewed: I have personally reviewed following  labs and imaging studies  CBC: Recent Labs  Lab 09/12/20 0446 09/13/20 0523 09/14/20 0514 09/15/20 0530 09/16/20 0627  WBC 8.5 9.3 9.2 7.8 6.3  NEUTROABS 6.8 7.2 7.0 5.7 4.4  HGB 10.0* 10.0* 10.0* 10.2* 9.9*  HCT 32.7* 32.0* 32.7* 32.4* 32.4*  MCV 81.3 80.8 80.3 80.2 80.4  PLT 347 351 378 382 784   Basic Metabolic Panel: Recent Labs  Lab 09/12/20 0446 09/13/20 0523 09/14/20 0514 09/15/20 0530 09/16/20 0627  NA 140 141 139 141 138  K 4.0 4.1 4.3 4.0 3.8  CL 105 105 103 104 101  CO2 24 26 25 25 27   GLUCOSE 129* 111* 117* 108* 112*  BUN 21* 21* 21* 21* 17  CREATININE 0.68 0.66 0.76 0.78 0.86  CALCIUM 9.7 9.7 9.9 10.0 10.0  MG 2.4 2.4 2.4 2.4 2.3  PHOS 2.7 3.2 3.2 3.8 3.7   GFR: Estimated Creatinine Clearance: 110.8 mL/min (by C-G formula based on SCr of 0.86 mg/dL). Liver Function Tests: No results for input(s): AST, ALT, ALKPHOS, BILITOT, PROT, ALBUMIN in the last 168 hours. No results for input(s): LIPASE, AMYLASE in the last 168 hours. No results for input(s): AMMONIA in the last 168 hours. Coagulation Profile: No results for input(s): INR, PROTIME in the last 168 hours. Cardiac Enzymes: No results for input(s): CKTOTAL, CKMB, CKMBINDEX, TROPONINI in the last 168 hours. BNP (last 3 results) No results for input(s): PROBNP in the last 8760 hours. HbA1C: No results for input(s): HGBA1C in the last 72 hours. CBG: Recent Labs  Lab 09/16/20 1612 09/16/20 2143 09/17/20 0117 09/17/20 0854 09/17/20 1200  GLUCAP 115* 91 114* 103* 105*   Lipid Profile: No results for input(s): CHOL, HDL, LDLCALC, TRIG, CHOLHDL, LDLDIRECT in the last 72 hours. Thyroid Function Tests: No results for input(s): TSH, T4TOTAL, FREET4, T3FREE, THYROIDAB in the last 72 hours. Anemia Panel: No results for input(s): VITAMINB12, FOLATE, FERRITIN, TIBC, IRON, RETICCTPCT in the last 72 hours. Sepsis Labs: No results for input(s): PROCALCITON, LATICACIDVEN in the last 168 hours.  Recent  Results (from the past 240 hour(s))  Culture, respiratory (non-expectorated)     Status: None   Collection Time: 09/07/20  4:02 PM   Specimen: Tracheal Aspirate; Respiratory  Result Value Ref Range Status   Specimen Description   Final    TRACHEAL ASPIRATE Performed at [facility], [address] [place], [place] [address]    Special Requests   Final    NONE Performed at [facility], [place], [place] [address]    Gram Stain   Final    RARE WBC PRESENT,BOTH PMN AND MONONUCLEAR FEW GRAM NEGATIVE RODS Performed at [facility], [place] [address] [place], [place] [address]    Culture RARE PSEUDOMONAS AERUGINOSA  Final   Report Status 09/09/2020 FINAL  Final   Organism ID, Bacteria PSEUDOMONAS AERUGINOSA  Final      Susceptibility   Pseudomonas aeruginosa - MIC*    CEFTAZIDIME 4 SENSITIVE Sensitive     CIPROFLOXACIN <=0.25 SENSITIVE Sensitive     GENTAMICIN <=1 SENSITIVE Sensitive     IMIPENEM 2 SENSITIVE Sensitive     PIP/TAZO 8 SENSITIVE Sensitive     CEFEPIME 2 SENSITIVE Sensitive     * RARE PSEUDOMONAS AERUGINOSA      Imaging Studies   No results found.   Medications   Scheduled Meds: . enoxaparin (LOVENOX) injection  0.5 mg/kg Subcutaneous Q24H  . feeding supplement  1 Container Oral TID BM  . fluconazole  100 mg Oral Q24H  .  insulin aspart  0-20 Units Subcutaneous TID AC & HS  . mouth rinse  15 mL Mouth Rinse q12n4p  . nystatin  5 mL Oral QID  . pantoprazole  40 mg Oral BID AC  . traZODone  100 mg Oral QHS   Continuous Infusions: . sodium chloride Stopped (09/07/20 0725)       LOS: 35 days    Time spent: 20 minutes    Ezekiel Slocumb, DO [facility]  09/17/2020, 2:08 PM    If 7PM-7AM, please contact night-coverage. How to contact the [facility] Attending or Consulting provider [place] or covering provider during after hours [place], for this patient?    1. Check the care team in [facility] and look for a)  attending/consulting [facility] provider listed and b) the [facility] team listed 2. Log into [email or web address] and use [facility] universal password to access. If you do not have the password, please contact the hospital operator. 3. Locate the [facility] provider you are looking for under [facility] and page to a number that you can be directly reached. 4. If you still have difficulty reaching the provider, please page the [facility] (Director on Call) for the Hospitalists listed on amion for assistance.

## 2020-09-17 NOTE — TOC Progression Note (Signed)
Transition of Care [facility]) - Progression Note    Patient Details  Name: Kathy Vega MRN: [id number] Date of Birth: 23-Nov-1976  [facility] CM/SW Contact  Shelbie Hutching, RN Phone Number: 09/17/2020, 11:09 AM  Clinical Narrative:    CIR started insurance authorization on Monday, as of today insurance auth is still pending.     Expected Discharge Plan: IP Rehab Facility Barriers to Discharge: Insurance Authorization  Expected Discharge Plan and Services Expected Discharge Plan: [place]     Post Acute Care Choice: IP Rehab Living arrangements for the past 2 months: Single Family Home                                       Social Determinants of Health (SDOH) Interventions    Readmission Risk Interventions Readmission Risk Prevention Plan 09/10/2020  Transportation Screening Complete  PCP or Specialist Appt within 3-5 Days Complete  HRI or [place] Complete  Social Work Consult for [place] Planning/Counseling Complete  Palliative Care Screening Complete  Medication Review Press photographer) Complete  Some recent data might be hidden

## 2020-09-17 NOTE — Progress Notes (Signed)
[facility] IP rehab admissions - I have received authorization for acute inpatient rehab admission for today.  Currently I have no bed availability for this patient today.  I will have one of my partners follow up tomorrow for progress and bed availability.  Call for questions.  [phone]

## 2020-09-17 NOTE — Progress Notes (Signed)
[facility] IP rehab admissions - I called insurance carrier today to check status of request for CIR at [facility].  Decision has not been made yet about potential acute inpatient rehab admission.  I will update all when I hear back from insurance case manager.  Call for questions.  [phone]

## 2020-09-18 DIAGNOSIS — Z930 Tracheostomy status: Secondary | ICD-10-CM

## 2020-09-18 DIAGNOSIS — J9601 Acute respiratory failure with hypoxia: Secondary | ICD-10-CM | POA: Diagnosis not present

## 2020-09-18 DIAGNOSIS — U071 COVID-19: Secondary | ICD-10-CM | POA: Diagnosis not present

## 2020-09-18 LAB — GLUCOSE, CAPILLARY
Glucose-Capillary: 96 mg/dL (ref 70–99)
Glucose-Capillary: 82 mg/dL (ref 70–99)
Glucose-Capillary: 80 mg/dL (ref 70–99)
Glucose-Capillary: 96 mg/dL (ref 70–99)

## 2020-09-18 NOTE — Progress Notes (Addendum)
PROGRESS NOTE    Kathy Vega   GQB:[id number]  DOB: February 15, 1976  PCP: [facility] [place]    DOA: 08/13/2020 LOS: 61   Brief Narrative   "44 yo unvaccinated female admitted with COVID-19 pneumonia & ARDS requiring intubation and mechanical ventilation.   09/16: Pt admitted to ICU requiring Heated HFNC and NRB, however remained in the ER pending ICU bed availability  09/16: CT Head revealed no acute intracranial abnormality 09/17: Pt with worsening hypoxia requiring mechanical intubation 09/18: [facility] refused to accept pt for ECMO 09/21: Attempted to transfer pt to [facility] and [facility] [facility] for possible assessment for ECMO, however both hospitals DO NOT have bed availability and are at capacity and pt may or may NOT be an ECMO candidate at this time  09/23: Pt unproned  09/24:  Resumed proning protocol 09/25: Resumed proning protocol  09/26: Pt unproned 9/27-  No onvernight events, patient remains in ARDS on elevated settings. 08/25/20- patient is on maximal settings with sedation. She is on day 11 of her illness with little improvement.  Ive attempted to reach husband via cell no answer, ive successfully found her daughter and spoke with her regarding care plan and she is agreeable but requested for me to speak with [name] and has provided me with secondary phone number [phone] but again only voice mail was reachable.  Have left voicemail and will discuss trache with family when they are available.  08/26/20- patient without overnight events, ive called POA husband again today but was unable to reach him and left voice mail.  08/27/20- patient critically ill with overall poor prognosis, she had desaturation event today and was difficult to improve requiring multiple changes on ventilator by RT.  08/28/20- Patient is weaned to 60% FiO2 on MV.  Have called Garlan Fillers today and updated him. We discussed trache and he is agreeable.  08/29/20- Spoke with  husband today, answered questions ,updated on care plan,  he is ready to speak with ENT regarding trache palcement 08/30/20- patient had SBT today she was awake and followed verbal communication but after 2 hours had severly labored breathing and thick high volume secretions.  I have met with husband Garlan Fillers and he was able to see patient at bedside we discussed trache. I have updated Dr Tami Ribas regarding trache plan.  08/31/20 - no airleak will need trach, discussed with Dr.Vaught. 09/01/20-Still no air leak. Patient has trach scheduled for Friday of this week and thus no need for steroids at this time 09/02/20- no air leak, continuing plan of care, no other concerns 09/03/20- very minimal air leak, continue with trach 10/8 09/04/20- Trach placed 09/07/20- Patient OOB with lift, weaned to 5/5, PICC & foley removed 09/08/20- tolerated 1 hr TC, OOB to chair without lift, pivoted on her own strength 09/09/20- patient not sleeping well O/N, bad dreams and overall feeling "down", tolerated TC 2 hours 09/11/20- patient is participating with PT while on trache collar and did well. Sutures removed. Will optimize for Stepdown status today.  FiO2 - 35%.  Pseudomonas on tracheal aspirate - plan for 7 d cefepime.  Trache size 8 switch to cuffed 6 today to allow for speech.  09/12/20- Patient stable, family came for visit. Continues to have clinical signs of improvement. Continuing current care plan. TOC consultation for post trache placement.  09/13/20- patient is improved, plan to optimize for [facility] transfer today. Will page out to [facility] for pick up this am.   10/19-did  well with MBSS, no aspiration . Started on dysphagia 3 diet. Doboff tube d/c'd "     Assessment & Plan   Active Problems:   COVID-19   CAP (community acquired pneumonia)   Acute respiratory failure ([place])   Tracheostomy in place [facility])   Acute renal failure (ARF) (H[facility])   Hypokalemia   Elevated liver enzymes   Acute  encephalopathy   Acute hypoxic respiratory failure secondary to COVID-19 pneumonia and ARDS Morbid obesity, possible OSA S/p course of remdesevir, azithromycin & ceftriaxone.  Off airborne precautions. Tracheostomy placed 10/8.  Cefepime started 10/11 for suspected HCAP-tracheal aspriate +pseudomonas Will need sleep study as outpatient PT OT recommending CIR - insurance authorized, but no beds currently.   Tracheostomy status Pt had severe laryngeal edema, per PCCM and ENT.   No air passing around downsized trach due to edema most likely.  Unable to use speech valve or cap trach yet.   Discussed with Dr. Mortimer Fries and Dr. Tami Ribas on 10/21.  Both recommend maintaining trach as is currently. --follow up with ENT in 2 weeks (either Dr. Tami Ribas here or [facility] if at [facility]).   --need to do trach care teaching & training with patient (self suctioning etc)   Leukocytosis-possibly due to left lower lobe pneumonia PICC &foley removed- 10/11 Improved S/p treatment with cefepime due to tracheal aspirate growing pseudomonas Monitor CBC   Troponin elevation-due to demand ischemia Hypertrophic cardiomyopathy Was treated with lasix Echo revealed EF 45-50% with severe concentric LVH, diastolic dysfunction    Mild acute renal failure -resolved Hypokalemia -resolved Hypernatremia -resolved -All remained stable  Continue monitoring periodically  Avoid nephrotoxic agents    Anemia without obvious acute blood loss Last transfusion 9/23 H&H remained stable  Monitor Hbg & transfuse if less than 7    Hyperglycemia  Dobbhoff discontinued  On dysphagia 3 diet  Continue R-ISS    Nutrition-passed MBSS without any aspirations .Dobbhoff discontinued.  Dysphagia 3 diet (tolerating well).    Obesity: Body mass index is 35.45 kg/m.  Complicates overall care and prognosis.  DVT prophylaxis: SCDs Start: 08/13/20 2035   Diet:  Diet Orders (From admission, onward)    Start      Ordered   09/15/20 0943  DIET DYS 3 Room service appropriate? Yes with Assist; Fluid consistency: Thin  Diet effective now       Comments: Extra Gravy on meats, potatoes. Cream Soups are preferred. Yogurts.  Question Answer Comment  Room service appropriate? Yes with Assist   Fluid consistency: Thin      09/15/20 0944            Code Status: Full Code    Subjective 09/18/20    Pt seen this AM at bedside.  She gestures "so so" with her hand when asked how she's feeling today.  Asked what is bothering her, she points to trach.  Still having secretions.  Her nurse says requiring suction about every 3-4 hours.  Pt indicates she is not used to the trach and it's uncomfortable.  No fever/chills, N/V, SOB or other complaints.   Disposition Plan & Communication   Status is: Inpatient  Remains inpatient appropriate because:Inpatient level of care appropriate due to severity of illness.  Patient with tracheostomy, long complicated admission, overall weak and deconditioned. PT recommending CIR and auth received, discharge pending bed available.  Continue inpatient close monitoring and respiratory care.   Dispo: The patient is from: Home  Anticipated d/c is to: CIR              Anticipated d/c date is: 1 days              Patient currently is medically stable to d/c.    Family Communication: None at bedside on rounds today, will attempt to call.  Husband and son at bedside during encounter 10/20.   Consults, Procedures, Significant Events   Consultants:   PCCM  Procedures:  ETT 9/17 >> 10/8 PICC 9/17 >> 10/11 Tracheostomy 10/8 >>   Antimicrobials:  Anti-infectives (From admission, onward)   Start     Dose/Rate Route Frequency Ordered Stop   09/15/20 1700  fluconazole (DIFLUCAN) tablet 100 mg        100 mg Oral Every 24 hours 09/15/20 1100     09/12/20 1700  fluconazole (DIFLUCAN) tablet 100 mg  Status:  Discontinued        100 mg Oral Every 24 hours  09/11/20 1346 09/12/20 0941   09/12/20 1700  fluconazole (DIFLUCAN) tablet 100 mg  Status:  Discontinued        100 mg Per Tube Every 24 hours 09/12/20 0941 09/15/20 1100   09/11/20 1400  fluconazole (DIFLUCAN) IVPB 100 mg        100 mg 50 mL/hr over 60 Minutes Intravenous Every 24 hours 09/11/20 1310 09/11/20 1458   09/07/20 1600  ceFEPIme (MAXIPIME) 2 g in sodium chloride 0.9 % 100 mL IVPB        2 g 200 mL/hr over 30 Minutes Intravenous Every 8 hours 09/07/20 1500 09/14/20 0720   08/21/20 2200  metroNIDAZOLE (FLAGYL) IVPB 500 mg        500 mg 100 mL/hr over 60 Minutes Intravenous Every 8 hours 08/21/20 2102 08/26/20 1535   08/14/20 1000  remdesivir 100 mg in sodium chloride 0.9 % 100 mL IVPB       "Followed by" Linked Group Details   100 mg 200 mL/hr over 30 Minutes Intravenous Daily 08/13/20 2040 08/17/20 1034   08/13/20 2230  azithromycin (ZITHROMAX) 500 mg in sodium chloride 0.9 % 250 mL IVPB  Status:  Discontinued        500 mg 250 mL/hr over 60 Minutes Intravenous Every 24 hours 08/13/20 2102 08/17/20 1021   08/13/20 2200  cefTRIAXone (ROCEPHIN) 2 g in sodium chloride 0.9 % 100 mL IVPB  Status:  Discontinued        2 g 200 mL/hr over 30 Minutes Intravenous Every 24 hours 08/13/20 2102 08/17/20 1021   08/13/20 2045  remdesivir 200 mg in sodium chloride 0.9% 250 mL IVPB       "Followed by" Linked Group Details   200 mg 580 mL/hr over 30 Minutes Intravenous Once 08/13/20 2040 08/14/20 0216         Objective   Vitals:   09/18/20 0504 09/18/20 0516 09/18/20 0738 09/18/20 1241  BP: 111/65 108/71 108/70 120/81  Pulse: (!) 101 90 (!) 103 (!) 103  Resp: 15 15 15 16   Temp: 98.8 F (37.1 C) 98 F (36.7 C) 99 F (37.2 C) 98.8 F (37.1 C)  TempSrc:  Oral Axillary Axillary  SpO2: 93% 96% 98% 98%  Weight:  109.3 kg    Height:        Intake/Output Summary (Last 24 hours) at 09/18/2020 1422 Last data filed at 09/18/2020 0945 Gross per 24 hour  Intake 240 ml  Output --   Net 240 ml   [employer]  09/16/20 0500 09/17/20 0500 09/18/20 0516  Weight: 110 kg 110.5 kg 109.3 kg    Physical Exam:  General exam: awake, alert, no acute distress HEENT: tracheostomy with trach collar oxygen at 5 L/min, hearing grossly normal  Respiratory system: referred upper airway secretion sounds, normal respiratory effort Cardiovascular system: normal S1/S2, RRR, no edema.   Gastrointestinal: soft, non-tender, non-distended   Labs   Data Reviewed: I have personally reviewed following labs and imaging studies  CBC: Recent Labs  Lab 09/12/20 0446 09/13/20 0523 09/14/20 0514 09/15/20 0530 09/16/20 0627  WBC 8.5 9.3 9.2 7.8 6.3  NEUTROABS 6.8 7.2 7.0 5.7 4.4  HGB 10.0* 10.0* 10.0* 10.2* 9.9*  HCT 32.7* 32.0* 32.7* 32.4* 32.4*  MCV 81.3 80.8 80.3 80.2 80.4  PLT 347 351 378 382 381   Basic Metabolic Panel: Recent Labs  Lab 09/12/20 0446 09/13/20 0523 09/14/20 0514 09/15/20 0530 09/16/20 0627  NA 140 141 139 141 138  K 4.0 4.1 4.3 4.0 3.8  CL 105 105 103 104 101  CO2 24 26 25 25 27   GLUCOSE 129* 111* 117* 108* 112*  BUN 21* 21* 21* 21* 17  CREATININE 0.68 0.66 0.76 0.78 0.86  CALCIUM 9.7 9.7 9.9 10.0 10.0  MG 2.4 2.4 2.4 2.4 2.3  PHOS 2.7 3.2 3.2 3.8 3.7   GFR: Estimated Creatinine Clearance: 110.2 mL/min (by C-G formula based on SCr of 0.86 mg/dL). Liver Function Tests: No results for input(s): AST, ALT, ALKPHOS, BILITOT, PROT, ALBUMIN in the last 168 hours. No results for input(s): LIPASE, AMYLASE in the last 168 hours. No results for input(s): AMMONIA in the last 168 hours. Coagulation Profile: No results for input(s): INR, PROTIME in the last 168 hours. Cardiac Enzymes: No results for input(s): CKTOTAL, CKMB, CKMBINDEX, TROPONINI in the last 168 hours. BNP (last 3 results) No results for input(s): PROBNP in the last 8760 hours. HbA1C: No results for input(s): HGBA1C in the last 72 hours. CBG: Recent Labs  Lab 09/17/20 1200  09/17/20 1653 09/17/20 2103 09/18/20 0736 09/18/20 1238  GLUCAP 105* 107* 100* 96 82   Lipid Profile: No results for input(s): CHOL, HDL, LDLCALC, TRIG, CHOLHDL, LDLDIRECT in the last 72 hours. Thyroid Function Tests: No results for input(s): TSH, T4TOTAL, FREET4, T3FREE, THYROIDAB in the last 72 hours. Anemia Panel: No results for input(s): VITAMINB12, FOLATE, FERRITIN, TIBC, IRON, RETICCTPCT in the last 72 hours. Sepsis Labs: No results for input(s): PROCALCITON, LATICACIDVEN in the last 168 hours.  No results found for this or any previous visit (from the past 240 hour(s)).    Imaging Studies   No results found.   Medications   Scheduled Meds: . enoxaparin (LOVENOX) injection  0.5 mg/kg Subcutaneous Q24H  . feeding supplement  1 Container Oral TID BM  . fluconazole  100 mg Oral Q24H  . insulin aspart  0-20 Units Subcutaneous TID AC & HS  . mouth rinse  15 mL Mouth Rinse q12n4p  . nystatin  5 mL Oral QID  . pantoprazole  40 mg Oral BID AC  . traZODone  100 mg Oral QHS   Continuous Infusions: . sodium chloride Stopped (09/07/20 0725)       LOS: 36 days    Time spent: 20 minutes    Ezekiel Slocumb, DO [facility]  09/18/2020, 2:22 PM    If 7PM-7AM, please contact night-coverage. How to contact the [facility] Attending or Consulting provider [place] or covering provider during after hours [place], for this patient?  1. Check the care team in [facility] and look for a) attending/consulting TR[facility] provider listed and b) the [facility] team listed 2. Log into [email or web address] and use [place] universal password to access. If you do not have the password, please contact the hospital operator. 3. Locate the [facility] provider you are looking for under [facility] and page to a number that you can be directly reached. 4. If you still have difficulty reaching the provider, please page the [facility] (Director on Call) for the Hospitalists listed on amion for assistance.

## 2020-09-18 NOTE — Progress Notes (Signed)
Physical Therapy Treatment Patient Details Name: Kathy Vega MRN: [id number] DOB: Nov 15, 1976 Today's Date: 09/18/2020    History of Present Illness Pt is a 44 y.o. female presenting to hospital 9/16 with SOB and confusion; initial O2 sats in 20's; pt COVID (+) 6 days prior.  Mechanically intubated 9/17.  S/p trach 10/8.  Pt admitted with acute hypoxic respiratory failure secondary COVID-19 PNA and ARDS, possible OSA with upper airway swelling s/p trach, and severe acute hypoxic and hypercapnic respiratory failure.  PMH includes anemia, thyroid disease, h/o leg surgery.    PT Comments    Pt seen this pm, received sitting at EOB. Pt agreed to skilled PT services. Sit<>stand, SPT with CG/SBA for safety awareness, independent with hygiene. Pt completed gait training on level surface without AD 38ft x 6 with standing rest breaks and CGA due to occassional LOB. Pt remained on 30% fiO2 at 4L O2 trach mask. O2 sats remained between 93-98% upon exertion, HR 139-142bpm. Pt requires increased time for gait training due to fatigue and unsteadiness. Continue per POC.   Follow Up Recommendations  CIR;Supervision/Assistance - 24 hour;Supervision for mobility/OOB     Equipment Recommendations  Rolling walker with 5" wheels;3in1 (PT)    Recommendations for Other Services OT consult     Precautions / Restrictions Precautions Precautions: Fall Precaution Comments: Trach (trach collar); HOB >30 degrees; Dobhoff Restrictions Weight Bearing Restrictions: No    Mobility  Bed Mobility                  Transfers Overall transfer level: Needs assistance Equipment used: None Transfers: Sit to/from [employer] Transfers Sit to Stand: Min guard Stand pivot transfers: Min guard       General transfer comment: CGA for safety. vcs for improved technique and fwd wt shift. stood from bed at lowest height  Ambulation/Gait Ambulation/Gait assistance: Min guard Gait Distance (Feet):  (22ft  x 6, standing rest breaks) Assistive device: None Gait Pattern/deviations: Step-through pattern;Decreased stride length;Shuffle;Narrow base of support Gait velocity: decreased       Stairs             Wheelchair Mobility    Modified Rankin (Stroke Patients Only)       Balance Overall balance assessment: Needs assistance Sitting-balance support: No upper extremity supported;Feet supported Sitting balance-Leahy Scale: Good Sitting balance - Comments: no LOB with close supervision   Standing balance support: No upper extremity supported;During functional activity Standing balance-Leahy Scale: Fair Standing balance comment: pt does have unsteadiness with dynamic activity                             Cognition Arousal/Alertness: Awake/alert Behavior During Therapy: WFL for tasks assessed/performed Overall Cognitive Status: Within Functional Limits for tasks assessed                                 General Comments: Pt is able to communicate via white board and mouthing words. gives thumbs up/down appropriately      Exercises      General Comments        Pertinent Vitals/Pain Pain Assessment: No/denies pain Faces Pain Scale: No hurt    Home Living                      Prior Function            PT  Goals (current goals can now be found in the care plan section) Acute Rehab PT Goals Patient Stated Goal: go home Progress towards PT goals: Progressing toward goals    Frequency    7X/week      PT Plan Current plan remains appropriate    Co-evaluation              AM-PAC PT "6 Clicks" Mobility   Outcome Measure  Help needed turning from your back to your side while in a flat bed without using bedrails?: A Little Help needed moving from lying on your back to sitting on the side of a flat bed without using bedrails?: A Little Help needed moving to and from a bed to a chair (including a wheelchair)?: A  Little Help needed standing up from a chair using your arms (e.g., wheelchair or bedside chair)?: A Little Help needed to walk in hospital room?: A Little Help needed climbing 3-5 steps with a railing? : A Little 6 Click Score: 18    End of Session Equipment Utilized During Treatment: Oxygen Activity Tolerance: Patient tolerated treatment well Patient left: in chair;with call bell/phone within reach;with chair alarm set Nurse Communication: Mobility status PT Visit Diagnosis: Other abnormalities of gait and mobility (R26.89);Muscle weakness (generalized) (M62.81);Difficulty in walking, not elsewhere classified (R26.2)     Time: 1345-1430 PT Time Calculation (min) (ACUTE ONLY): 45 min  Charges:  $Gait Training: 23-37 mins $Therapeutic Activity: 8-22 mins               Mikel Cella, PTA   Kathy Vega 09/18/2020, 3:34 PM

## 2020-09-18 NOTE — Progress Notes (Signed)
OT Cancellation Note  Patient Details Name: Kathy Vega MRN: [id number] DOB: [birth year]   Cancelled Treatment:    Reason Eval/Treat Not Completed: Fatigue/lethargy limiting ability to participate. OT attempt made. Pt reports she just ambulated with PT and was fatigued at this point. Pt using dry erase board to ask therapist to place dinner order. OT assisted and pt asking therapist to return at a later time. OT to follow up as able.   Darleen Crocker, MS, OTR/L , CBIS ascom [phone]  09/18/20, 3:17 PM   09/18/2020, 3:16 PM

## 2020-09-18 NOTE — Progress Notes (Signed)
Inpatient Rehabilitation Admissions Coordinator  We have received insurance approval for Cir at [facility] but no bed available to admit this patient into next week. I discussed with Dr. Naaman Plummer and he recommends either other acute inpt rehab venue or increasing therapy and nursing education to prepare patient to discharge home with [facility] and family support. She is progressing rapidly with functional progress with therapy. I have alerted acute team and TOC.  Danne Baxter, RN, MSN Rehab Admissions Coordinator [phone] 09/18/2020 1:21 PM

## 2020-09-18 NOTE — Progress Notes (Addendum)
Pt AAox4, VS stable, No complaints today. Up to [facility] and ambulating in hallway. Pt requiring suctioning q3-4 hrs. Meds crushed in applesauce. Using communication board to express needs. No adverse events or acute changes. Will continue to monitor.

## 2020-09-19 DIAGNOSIS — Z930 Tracheostomy status: Secondary | ICD-10-CM | POA: Diagnosis not present

## 2020-09-19 DIAGNOSIS — J9601 Acute respiratory failure with hypoxia: Secondary | ICD-10-CM | POA: Diagnosis not present

## 2020-09-19 LAB — BASIC METABOLIC PANEL
Anion gap: 10 (ref 5–15)
BUN: 10 mg/dL (ref 6–20)
CO2: 26 mmol/L (ref 22–32)
Calcium: 10 mg/dL (ref 8.9–10.3)
Chloride: 102 mmol/L (ref 98–111)
Creatinine, Ser: 0.86 mg/dL (ref 0.44–1.00)
GFR, Estimated: >60 mL/min (ref >60)
Glucose, Bld: 102 mg/dL — ABNORMAL HIGH (ref 70–99)
Potassium: 4 mmol/L (ref 3.5–5.1)
Sodium: 138 mmol/L (ref 135–145)

## 2020-09-19 LAB — CBC
HCT: 33.6 % — ABNORMAL LOW (ref 36.0–46.0)
Hemoglobin: 10.6 g/dL — ABNORMAL LOW (ref 12.0–15.0)
MCH: 24.9 pg — ABNORMAL LOW (ref 26.0–34.0)
MCHC: 31.5 g/dL (ref 30.0–36.0)
MCV: 78.9 fL — ABNORMAL LOW (ref 80.0–100.0)
Platelets: 347 10*3/uL (ref 150–400)
RBC: 4.26 MIL/uL (ref 3.87–5.11)
RDW: 24.8 % — ABNORMAL HIGH (ref 11.5–15.5)
WBC: 6.6 10*3/uL (ref 4.0–10.5)
nRBC: 0 % (ref 0.0–0.2)

## 2020-09-19 LAB — GLUCOSE, CAPILLARY
Glucose-Capillary: 89 mg/dL (ref 70–99)
Glucose-Capillary: 96 mg/dL (ref 70–99)
Glucose-Capillary: 91 mg/dL (ref 70–99)

## 2020-09-19 NOTE — Progress Notes (Signed)
PROGRESS NOTE    Kathy Vega   FKC:[id number]  DOB: [birth year]  PCP: [facility]    DOA: 08/13/2020 LOS: 8   Brief Narrative   "44 yo unvaccinated female admitted with COVID-19 pneumonia & ARDS requiring intubation and mechanical ventilation.   09/16: Pt admitted to ICU requiring Heated HFNC and NRB, however remained in the ER pending ICU bed availability  09/16: CT Head revealed no acute intracranial abnormality 09/17: Pt with worsening hypoxia requiring mechanical intubation 09/18: [facility] refused to accept pt for ECMO 09/21: Attempted to transfer pt to [facility] for possible assessment for ECMO, however both hospitals DO NOT have bed availability and are at capacity and pt may or may NOT be an ECMO candidate at this time  09/23: Pt unproned  09/24:  Resumed proning protocol 09/25: Resumed proning protocol  09/26: Pt unproned 9/27-  No onvernight events, patient remains in ARDS on elevated settings. 08/25/20- patient is on maximal settings with sedation. She is on day 11 of her illness with little improvement.  Ive attempted to reach husband via cell no answer, ive successfully found her daughter and spoke with her regarding care plan and she is agreeable but requested for me to speak with [name] and has provided me with secondary phone number [phone] but again only voice mail was reachable.  Have left voicemail and will discuss trache with family when they are available.  08/26/20- patient without overnight events, ive called POA husband again today but was unable to reach him and left voice mail.  08/27/20- patient critically ill with overall poor prognosis, she had desaturation event today and was difficult to improve requiring multiple changes on ventilator by RT.  08/28/20- Patient is weaned to 60% FiO2 on MV.  Have called Garlan Fillers today and updated him. We discussed trache and he is agreeable.  08/29/20- Spoke with  husband today, answered questions ,updated on care plan,  he is ready to speak with ENT regarding trache palcement 08/30/20- patient had SBT today she was awake and followed verbal communication but after 2 hours had severly labored breathing and thick high volume secretions.  I have met with husband Garlan Fillers and he was able to see patient at bedside we discussed trache. I have updated Dr Tami Ribas regarding trache plan.  08/31/20 - no airleak will need trach, discussed with Dr.Vaught. 09/01/20-Still no air leak. Patient has trach scheduled for Friday of this week and thus no need for steroids at this time 09/02/20- no air leak, continuing plan of care, no other concerns 09/03/20- very minimal air leak, continue with trach 10/8 09/04/20- Trach placed 09/07/20- Patient OOB with lift, weaned to 5/5, PICC & foley removed 09/08/20- tolerated 1 hr TC, OOB to chair without lift, pivoted on her own strength 09/09/20- patient not sleeping well O/N, bad dreams and overall feeling "down", tolerated TC 2 hours 09/11/20- patient is participating with PT while on trache collar and did well. Sutures removed. Will optimize for Stepdown status today.  FiO2 - 35%.  Pseudomonas on tracheal aspirate - plan for 7 d cefepime.  Trache size 8 switch to cuffed 6 today to allow for speech.  09/12/20- Patient stable, family came for visit. Continues to have clinical signs of improvement. Continuing current care plan. TOC consultation for post trache placement.  09/13/20- patient is improved, plan to optimize for [facility] transfer today. Will page out to [facility] for pick up this am.   10/19-did  well with MBSS, no aspiration . Started on dysphagia 3 diet. Doboff tube d/c'd "    I assumed care of patient on 09/16/20.  Patient has remained clinical stable without acute issues this week.  Awaiting CIR bed to become available.      Assessment & Plan   Active Problems:   COVID-19   CAP (community acquired pneumonia)   Acute  respiratory failure ([place])   Tracheostomy in place [facility])   Acute renal failure (ARF) (H[facility])   Hypokalemia   Elevated liver enzymes   Acute encephalopathy   Diarrhea - reported on 10/23, approximately 5 episodes in past day.  No fever chills or abdominal pain.  No leukocytosis. --Will discuss with ID whether to test for C diff  Acute hypoxic respiratory failure secondary to COVID-19 pneumonia and ARDS Morbid obesity, possible OSA S/p course of remdesevir, azithromycin & ceftriaxone.  Off airborne precautions. Tracheostomy placed 10/8.  Cefepime started 10/11 for suspected HCAP-tracheal aspriate +pseudomonas - course completed. Will need sleep study as outpatient PT OT recommending CIR - insurance authorized, but no beds currently.   Tracheostomy status Pt had severe laryngeal edema, per PCCM and ENT.   No air passing around downsized trach due to persistent edema most likely.  Unable to use speech valve or cap trach yet.   Discussed with Dr. Mortimer Fries and Dr. Tami Ribas on 10/21.  Both recommend maintaining trach as is currently. 10/23: continues to require suction every 3-4 hours.  Pt hesitant to learn self-suctioning at this time. --follow up with ENT in 2 weeks (either Dr. Tami Ribas here or [facility] if at [facility]).   --need to do trach care teaching & training with patient (self suctioning etc)   Leukocytosis-possibly due to left lower lobe pneumonia PICC &foley removed- 10/11 Improved S/p treatment with cefepime due to tracheal aspirate growing pseudomonas Monitor CBC   Troponin elevation-due to demand ischemia Hypertrophic cardiomyopathy Was treated with lasix Echo revealed EF 45-50% with severe concentric LVH, diastolic dysfunction    Mild acute renal failure -resolved Hypokalemia -resolved Hypernatremia -resolved -All remained stable  Continue monitoring periodically  Avoid nephrotoxic agents    Anemia without obvious acute blood loss Last transfusion  9/23 H&H remained stable  Monitor Hbg & transfuse if less than 7    Hyperglycemia  Dobbhoff discontinued  On dysphagia 3 diet  Continue R-ISS    Nutrition-passed MBSS without any aspirations .Dobbhoff discontinued.  Dysphagia 3 diet (tolerating well).    Obesity: Body mass index is 34.83 kg/m.  Complicates overall care and prognosis.  DVT prophylaxis: SCDs Start: 08/13/20 2035   Diet:  Diet Orders (From admission, onward)    Start     Ordered   09/15/20 0943  DIET DYS 3 Room service appropriate? Yes with Assist; Fluid consistency: Thin  Diet effective now       Comments: Extra Gravy on meats, potatoes. Cream Soups are preferred. Yogurts.  Question Answer Comment  Room service appropriate? Yes with Assist   Fluid consistency: Thin      09/15/20 0944            Code Status: Full Code    Subjective 09/19/20    Pt seen this AM at bedside.  States she is having watery diarrhea, about 5 episodes in past day.  Says everything she eats "comes right out".  No fever/chills or abdominal pain.  We discussed eventually learning trach care and self-suctioning, but she states not ready to do that yet.  Still quite comfortable  with trach.     Disposition Plan & Communication   Status is: Inpatient  Remains inpatient appropriate because:Inpatient level of care appropriate due to severity of illness.  Patient with tracheostomy, long complicated admission, overall weak and deconditioned. PT recommending CIR and auth received, discharge pending bed available.  Continue inpatient close monitoring and respiratory care.   Dispo: The patient is from: Home              Anticipated d/c is to: CIR              Anticipated d/c date is: 1 days              Patient currently is medically stable to d/c.    Family Communication: Husband and son at bedside during encounter today, 10/23.   Consults, Procedures, Significant Events   Consultants:   PCCM  Procedures:  ETT 9/17 >>  10/8 PICC 9/17 >> 10/11 Tracheostomy 10/8 >>   Antimicrobials:  Anti-infectives (From admission, onward)   Start     Dose/Rate Route Frequency Ordered Stop   09/15/20 1700  fluconazole (DIFLUCAN) tablet 100 mg        100 mg Oral Every 24 hours 09/15/20 1100     09/12/20 1700  fluconazole (DIFLUCAN) tablet 100 mg  Status:  Discontinued        100 mg Oral Every 24 hours 09/11/20 1346 09/12/20 0941   09/12/20 1700  fluconazole (DIFLUCAN) tablet 100 mg  Status:  Discontinued        100 mg Per Tube Every 24 hours 09/12/20 0941 09/15/20 1100   09/11/20 1400  fluconazole (DIFLUCAN) IVPB 100 mg        100 mg 50 mL/hr over 60 Minutes Intravenous Every 24 hours 09/11/20 1310 09/11/20 1458   09/07/20 1600  ceFEPIme (MAXIPIME) 2 g in sodium chloride 0.9 % 100 mL IVPB        2 g 200 mL/hr over 30 Minutes Intravenous Every 8 hours 09/07/20 1500 09/14/20 0720   08/21/20 2200  metroNIDAZOLE (FLAGYL) IVPB 500 mg        500 mg 100 mL/hr over 60 Minutes Intravenous Every 8 hours 08/21/20 2102 08/26/20 1535   08/14/20 1000  remdesivir 100 mg in sodium chloride 0.9 % 100 mL IVPB       "Followed by" Linked Group Details   100 mg 200 mL/hr over 30 Minutes Intravenous Daily 08/13/20 2040 08/17/20 1034   08/13/20 2230  azithromycin (ZITHROMAX) 500 mg in sodium chloride 0.9 % 250 mL IVPB  Status:  Discontinued        500 mg 250 mL/hr over 60 Minutes Intravenous Every 24 hours 08/13/20 2102 08/17/20 1021   08/13/20 2200  cefTRIAXone (ROCEPHIN) 2 g in sodium chloride 0.9 % 100 mL IVPB  Status:  Discontinued        2 g 200 mL/hr over 30 Minutes Intravenous Every 24 hours 08/13/20 2102 08/17/20 1021   08/13/20 2045  remdesivir 200 mg in sodium chloride 0.9% 250 mL IVPB       "Followed by" Linked Group Details   200 mg 580 mL/hr over 30 Minutes Intravenous Once 08/13/20 2040 08/14/20 0216         Objective   Vitals:   09/19/20 0500 09/19/20 0615 09/19/20 0751 09/19/20 1154  BP:  121/76 107/88 (!)  133/95  Pulse:  (!) 108 100 98  Resp:  17 17 19   Temp:  98.3 F (36.8 C) 98.4 F (36.9 C)  98.6 F (37 C)  TempSrc:  Axillary Axillary Axillary  SpO2:  99% 100% 97%  Weight: 107.4 kg     Height:        Intake/Output Summary (Last 24 hours) at 09/19/2020 1433 Last data filed at 09/19/2020 1300 Gross per 24 hour  Intake 240 ml  Output --  Net 240 ml   Filed Weights   09/17/20 0500 09/18/20 0516 09/19/20 0500  Weight: 110.5 kg 109.3 kg 107.4 kg    Physical Exam:  General exam: awake, alert, no acute distress, obese HEENT: tracheostomy with trach collar oxygen at 5 L/min, coarse secretion sounds with cough  Respiratory system: CTAB with intermittent upper airway secretion sounds, normal respiratory effort Cardiovascular system: normal S1/S2, RRR, no edema.      Labs   Data Reviewed: I have personally reviewed following labs and imaging studies  CBC: Recent Labs  Lab 09/13/20 0523 09/14/20 0514 09/15/20 0530 09/16/20 0627 09/19/20 0435  WBC 9.3 9.2 7.8 6.3 6.6  NEUTROABS 7.2 7.0 5.7 4.4  --   HGB 10.0* 10.0* 10.2* 9.9* 10.6*  HCT 32.0* 32.7* 32.4* 32.4* 33.6*  MCV 80.8 80.3 80.2 80.4 78.9*  PLT 351 378 382 357 517   Basic Metabolic Panel: Recent Labs  Lab 09/13/20 0523 09/14/20 0514 09/15/20 0530 09/16/20 0627 09/19/20 0435  NA 141 139 141 138 138  K 4.1 4.3 4.0 3.8 4.0  CL 105 103 104 101 102  CO2 26 25 25 27 26   GLUCOSE 111* 117* 108* 112* 102*  BUN 21* 21* 21* 17 10  CREATININE 0.66 0.76 0.78 0.86 0.86  CALCIUM 9.7 9.9 10.0 10.0 10.0  MG 2.4 2.4 2.4 2.3  --   PHOS 3.2 3.2 3.8 3.7  --    GFR: Estimated Creatinine Clearance: 109.2 mL/min (by C-G formula based on SCr of 0.86 mg/dL). Liver Function Tests: No results for input(s): AST, ALT, ALKPHOS, BILITOT, PROT, ALBUMIN in the last 168 hours. No results for input(s): LIPASE, AMYLASE in the last 168 hours. No results for input(s): AMMONIA in the last 168 hours. Coagulation Profile: No results for  input(s): INR, PROTIME in the last 168 hours. Cardiac Enzymes: No results for input(s): CKTOTAL, CKMB, CKMBINDEX, TROPONINI in the last 168 hours. BNP (last 3 results) No results for input(s): PROBNP in the last 8760 hours. HbA1C: No results for input(s): HGBA1C in the last 72 hours. CBG: Recent Labs  Lab 09/18/20 0736 09/18/20 1238 09/18/20 1624 09/18/20 2145 09/19/20 1153  GLUCAP 96 82 80 96 89   Lipid Profile: No results for input(s): CHOL, HDL, LDLCALC, TRIG, CHOLHDL, LDLDIRECT in the last 72 hours. Thyroid Function Tests: No results for input(s): TSH, T4TOTAL, FREET4, T3FREE, THYROIDAB in the last 72 hours. Anemia Panel: No results for input(s): VITAMINB12, FOLATE, FERRITIN, TIBC, IRON, RETICCTPCT in the last 72 hours. Sepsis Labs: No results for input(s): PROCALCITON, LATICACIDVEN in the last 168 hours.  No results found for this or any previous visit (from the past 240 hour(s)).    Imaging Studies   No results found.   Medications   Scheduled Meds: . enoxaparin (LOVENOX) injection  0.5 mg/kg Subcutaneous Q24H  . feeding supplement  1 Container Oral TID BM  . fluconazole  100 mg Oral Q24H  . insulin aspart  0-20 Units Subcutaneous TID AC & HS  . mouth rinse  15 mL Mouth Rinse q12n4p  . nystatin  5 mL Oral QID  . pantoprazole  40 mg Oral BID AC  . traZODone  100 mg Oral QHS   Continuous Infusions: . sodium chloride Stopped (09/07/20 0725)       LOS: 37 days    Time spent: 20 minutes    Ezekiel Slocumb, DO [facility]  09/19/2020, 2:33 PM    If 7PM-7AM, please contact night-coverage. How to contact the [facility] Attending or Consulting provider [place] or covering provider during after hours [place], for this patient?    1. Check the care team in [facility] and look for a) attending/consulting TRH provider listed and b) the [facility] team listed 2. Log into [email or web address] and use [place]'s universal password to access. If you do not have the password,  please contact the hospital operator. 3. Locate the [facility] provider you are looking for under [facility] and page to a number that you can be directly reached. 4. If you still have difficulty reaching the provider, please page the [facility] (Director on Call) for the Hospitalists listed on amion for assistance.

## 2020-09-19 NOTE — Progress Notes (Signed)
PT Cancellation Note  Patient Details Name: Kathy Vega MRN: [id number] DOB: 04-10-1976   Cancelled Treatment:     PT attempt. 3rd attempt today. Pt reports(via whiteboard) she did not sleep well last night. She has been much more lethargic throughout the day then from previous days. Will return tomorrow and continue to follow per POC.    [name] 09/19/2020, 4:24 PM

## 2020-09-20 DIAGNOSIS — J9601 Acute respiratory failure with hypoxia: Secondary | ICD-10-CM | POA: Diagnosis not present

## 2020-09-20 DIAGNOSIS — Z930 Tracheostomy status: Secondary | ICD-10-CM | POA: Diagnosis not present

## 2020-09-20 LAB — GLUCOSE, CAPILLARY
Glucose-Capillary: 97 mg/dL (ref 70–99)
Glucose-Capillary: 116 mg/dL — ABNORMAL HIGH (ref 70–99)
Glucose-Capillary: 96 mg/dL (ref 70–99)
Glucose-Capillary: 113 mg/dL — ABNORMAL HIGH (ref 70–99)

## 2020-09-20 NOTE — Progress Notes (Signed)
PT Cancellation Note  Patient Details Name: Kathy Vega MRN: [id number] DOB: 1976/11/04   Cancelled Treatment:    Reason Eval/Treat Not Completed: Other (comment)  Pt in bed, watching phone.  Refused session this am and was able to communicate on white board "after lunch."  Did not stop watching.  Lunch tray not arrived. Will reschedule as time allows.  Chesley Noon 09/20/2020, 12:01 PM

## 2020-09-20 NOTE — Progress Notes (Signed)
PROGRESS NOTE    Kathy Vega   VQQ:[id number]  DOB: [birth year]  PCP: [facility] [place]    DOA: 08/13/2020 LOS: 83   Brief Narrative   "44 yo unvaccinated female admitted with COVID-19 pneumonia & ARDS requiring intubation and mechanical ventilation.   09/16: Pt admitted to ICU requiring Heated HFNC and NRB, however remained in the ER pending ICU bed availability  09/16: CT Head revealed no acute intracranial abnormality 09/17: Pt with worsening hypoxia requiring mechanical intubation 09/18: [facility] refused to accept pt for ECMO 09/21: Attempted to transfer pt to [facility] for possible assessment for ECMO, however both hospitals DO NOT have bed availability and are at capacity and pt may or may NOT be an ECMO candidate at this time  09/23: Pt unproned  09/24:  Resumed proning protocol 09/25: Resumed proning protocol  09/26: Pt unproned 9/27-  No onvernight events, patient remains in ARDS on elevated settings. 08/25/20- patient is on maximal settings with sedation. She is on day 11 of her illness with little improvement.  Ive attempted to reach husband via cell no answer, ive successfully found her daughter and spoke with her regarding care plan and she is agreeable but requested for me to speak with [name] and has provided me with secondary phone number [phone] but again only voice mail was reachable.  Have left voicemail and will discuss trache with family when they are available.  08/26/20- patient without overnight events, ive called POA husband again today but was unable to reach him and left voice mail.  08/27/20- patient critically ill with overall poor prognosis, she had desaturation event today and was difficult to improve requiring multiple changes on ventilator by RT.  08/28/20- Patient is weaned to 60% FiO2 on MV.  Have called Garlan Fillers today and updated him. We discussed trache and he is agreeable.  08/29/20- Spoke with  husband today, answered questions ,updated on care plan,  he is ready to speak with ENT regarding trache palcement 08/30/20- patient had SBT today she was awake and followed verbal communication but after 2 hours had severly labored breathing and thick high volume secretions.  I have met with husband Garlan Fillers and he was able to see patient at bedside we discussed trache. I have updated Dr Tami Ribas regarding trache plan.  08/31/20 - no airleak will need trach, discussed with Dr.Vaught. 09/01/20-Still no air leak. Patient has trach scheduled for Friday of this week and thus no need for steroids at this time 09/02/20- no air leak, continuing plan of care, no other concerns 09/03/20- very minimal air leak, continue with trach 10/8 09/04/20- Trach placed 09/07/20- Patient OOB with lift, weaned to 5/5, PICC & foley removed 09/08/20- tolerated 1 hr TC, OOB to chair without lift, pivoted on her own strength 09/09/20- patient not sleeping well O/N, bad dreams and overall feeling "down", tolerated TC 2 hours 09/11/20- patient is participating with PT while on trache collar and did well. Sutures removed. Will optimize for Stepdown status today.  FiO2 - 35%.  Pseudomonas on tracheal aspirate - plan for 7 d cefepime.  Trache size 8 switch to cuffed 6 today to allow for speech.  09/12/20- Patient stable, family came for visit. Continues to have clinical signs of improvement. Continuing current care plan. TOC consultation for post trache placement.  09/13/20- patient is improved, plan to optimize for [facility] transfer today. Will page out to [facility] for pick up this am.   10/19-did  well with MBSS, no aspiration . Started on dysphagia 3 diet. Doboff tube d/c'd "    I assumed care of patient on 09/16/20.  Patient has remained clinical stable without acute issues this week.  Awaiting CIR bed to become available.      Assessment & Plan   Active Problems:   COVID-19   CAP (community acquired pneumonia)   Acute  respiratory failure ([place])   Tracheostomy in place [facility])   Acute renal failure (ARF) (HCC)   Hypokalemia   Elevated liver enzymes   Acute encephalopathy   Diarrhea - Improved on its own, only 1 episode today.   Pt reported on 10/23, approximately 5 episodes in past day.  No fever chills or abdominal pain.  No leukocytosis.   C diff test not indicated at this time.  Monitor.  Acute hypoxic respiratory failure secondary to COVID-19 pneumonia and ARDS Morbid obesity, possible OSA S/p course of remdesevir, azithromycin & ceftriaxone.  Off airborne precautions. Tracheostomy placed 10/8.  Cefepime started 10/11 for suspected HCAP-tracheal aspriate +pseudomonas - course completed. Will need sleep study as outpatient PT OT recommending CIR - insurance authorized, but no beds currently.   Tracheostomy status Pt had severe laryngeal edema, per PCCM and ENT.   No air passing around downsized trach due to persistent edema most likely.  Unable to use speech valve or cap trach yet.   Discussed with Dr. Mortimer Fries and Dr. Tami Ribas on 10/21.  Both recommend maintaining trach as is currently. 10/23: continues to require suction every 3-4 hours.  Pt hesitant to learn self-suctioning at this time. --follow up with ENT in 2 weeks (either Dr. Tami Ribas here or [facility] if at [facility]).   --need to do trach care teaching & training with patient (self suctioning etc)   Leukocytosis-possibly due to left lower lobe pneumonia PICC &foley removed- 10/11 Improved S/p treatment with cefepime due to tracheal aspirate growing pseudomonas Monitor CBC   Troponin elevation-due to demand ischemia Hypertrophic cardiomyopathy Was treated with lasix Echo revealed EF 45-50% with severe concentric LVH, diastolic dysfunction    Mild acute renal failure -resolved Hypokalemia -resolved Hypernatremia -resolved -All remained stable  Continue monitoring periodically  Avoid nephrotoxic agents    Anemia  without obvious acute blood loss Last transfusion 9/23 H&H remained stable  Monitor Hbg & transfuse if less than 7    Hyperglycemia  Dobbhoff discontinued  On dysphagia 3 diet  Continue R-ISS    Nutrition-passed MBSS without any aspirations .Dobbhoff discontinued.  Dysphagia 3 diet (tolerating well).    Obesity: Body mass index is 35.97 kg/m.  Complicates overall care and prognosis.  DVT prophylaxis: SCDs Start: 08/13/20 2035   Diet:  Diet Orders (From admission, onward)    Start     Ordered   09/15/20 0943  DIET DYS 3 Room service appropriate? Yes with Assist; Fluid consistency: Thin  Diet effective now       Comments: Extra Gravy on meats, potatoes. Cream Soups are preferred. Yogurts.  Question Answer Comment  Room service appropriate? Yes with Assist   Fluid consistency: Thin      09/15/20 0944            Code Status: Full Code    Subjective 09/20/20    Pt seen today at bedside.  She was resting but awake.  States feeling a little better today. Only one episode of diarrhea, that's gotten better.  No fever/chills or other acute complaints.   Disposition Plan & Communication   Status  is: Inpatient  Remains inpatient appropriate because:Inpatient level of care appropriate due to severity of illness.  Patient with tracheostomy, long complicated admission, overall weak and deconditioned. PT recommending CIR and auth received, discharge pending bed available.  Continue inpatient close monitoring and respiratory care.   Dispo: The patient is from: Home              Anticipated d/c is to: CIR              Anticipated d/c date is: 1 days (pending available bed)              Patient currently is medically stable to d/c.    Family Communication: Husband and son at bedside during encounter, 10/23.     Consults, Procedures, Significant Events   Consultants:   PCCM  Procedures:  ETT 9/17 >> 10/8 PICC 9/17 >> 10/11 Tracheostomy 10/8  >>   Antimicrobials:  Anti-infectives (From admission, onward)   Start     Dose/Rate Route Frequency Ordered Stop   09/15/20 1700  fluconazole (DIFLUCAN) tablet 100 mg  Status:  Discontinued        100 mg Oral Every 24 hours 09/15/20 1100 09/19/20 1436   09/12/20 1700  fluconazole (DIFLUCAN) tablet 100 mg  Status:  Discontinued        100 mg Oral Every 24 hours 09/11/20 1346 09/12/20 0941   09/12/20 1700  fluconazole (DIFLUCAN) tablet 100 mg  Status:  Discontinued        100 mg Per Tube Every 24 hours 09/12/20 0941 09/15/20 1100   09/11/20 1400  fluconazole (DIFLUCAN) IVPB 100 mg        100 mg 50 mL/hr over 60 Minutes Intravenous Every 24 hours 09/11/20 1310 09/11/20 1458   09/07/20 1600  ceFEPIme (MAXIPIME) 2 g in sodium chloride 0.9 % 100 mL IVPB        2 g 200 mL/hr over 30 Minutes Intravenous Every 8 hours 09/07/20 1500 09/14/20 0720   08/21/20 2200  metroNIDAZOLE (FLAGYL) IVPB 500 mg        500 mg 100 mL/hr over 60 Minutes Intravenous Every 8 hours 08/21/20 2102 08/26/20 1535   08/14/20 1000  remdesivir 100 mg in sodium chloride 0.9 % 100 mL IVPB       "Followed by" Linked Group Details   100 mg 200 mL/hr over 30 Minutes Intravenous Daily 08/13/20 2040 08/17/20 1034   08/13/20 2230  azithromycin (ZITHROMAX) 500 mg in sodium chloride 0.9 % 250 mL IVPB  Status:  Discontinued        500 mg 250 mL/hr over 60 Minutes Intravenous Every 24 hours 08/13/20 2102 08/17/20 1021   08/13/20 2200  cefTRIAXone (ROCEPHIN) 2 g in sodium chloride 0.9 % 100 mL IVPB  Status:  Discontinued        2 g 200 mL/hr over 30 Minutes Intravenous Every 24 hours 08/13/20 2102 08/17/20 1021   08/13/20 2045  remdesivir 200 mg in sodium chloride 0.9% 250 mL IVPB       "Followed by" Linked Group Details   200 mg 580 mL/hr over 30 Minutes Intravenous Once 08/13/20 2040 08/14/20 0216         Objective   Vitals:   09/20/20 0500 09/20/20 0730 09/20/20 1223 09/20/20 1618  BP:  (!) 115/92 110/75 105/77   Pulse:  87 (!) 103 92  Resp:  16 17 19   Temp:  98.6 F (37 C) 98.7 F (37.1 C) (!) 97.5 F (36.4 C)  TempSrc:  Oral Oral Oral  SpO2:  96% 100% 100%  Weight: 110.9 kg     Height:        Intake/Output Summary (Last 24 hours) at 09/20/2020 1754 Last data filed at 09/20/2020 0900 Gross per 24 hour  Intake 120 ml  Output --  Net 120 ml   Filed Weights   09/18/20 0516 09/19/20 0500 09/20/20 0500  Weight: 109.3 kg 107.4 kg 110.9 kg    Physical Exam:  General exam: resting but awake, no acute distress, obese HEENT: tracheostomy with trach collar oxygen at 5 L/min, hearing normal, moist mucus membranes Respiratory system: CTAB, normal respiratory effort, no audible secretion sounds  Cardiovascular system: normal S1/S2, RRR, no edema.      Labs   Data Reviewed: I have personally reviewed following labs and imaging studies  CBC: Recent Labs  Lab 09/14/20 0514 09/15/20 0530 09/16/20 0627 09/19/20 0435  WBC 9.2 7.8 6.3 6.6  NEUTROABS 7.0 5.7 4.4  --   HGB 10.0* 10.2* 9.9* 10.6*  HCT 32.7* 32.4* 32.4* 33.6*  MCV 80.3 80.2 80.4 78.9*  PLT 378 382 357 836   Basic Metabolic Panel: Recent Labs  Lab 09/14/20 0514 09/15/20 0530 09/16/20 0627 09/19/20 0435  NA 139 141 138 138  K 4.3 4.0 3.8 4.0  CL 103 104 101 102  CO2 25 25 27 26   GLUCOSE 117* 108* 112* 102*  BUN 21* 21* 17 10  CREATININE 0.76 0.78 0.86 0.86  CALCIUM 9.9 10.0 10.0 10.0  MG 2.4 2.4 2.3  --   PHOS 3.2 3.8 3.7  --    GFR: Estimated Creatinine Clearance: 111.1 mL/min (by C-G formula based on SCr of 0.86 mg/dL). Liver Function Tests: No results for input(s): AST, ALT, ALKPHOS, BILITOT, PROT, ALBUMIN in the last 168 hours. No results for input(s): LIPASE, AMYLASE in the last 168 hours. No results for input(s): AMMONIA in the last 168 hours. Coagulation Profile: No results for input(s): INR, PROTIME in the last 168 hours. Cardiac Enzymes: No results for input(s): CKTOTAL, CKMB, CKMBINDEX, TROPONINI  in the last 168 hours. BNP (last 3 results) No results for input(s): PROBNP in the last 8760 hours. HbA1C: No results for input(s): HGBA1C in the last 72 hours. CBG: Recent Labs  Lab 09/19/20 1600 09/19/20 2159 09/20/20 0905 09/20/20 1224 09/20/20 1617  GLUCAP 96 91 97 116* 96   Lipid Profile: No results for input(s): CHOL, HDL, LDLCALC, TRIG, CHOLHDL, LDLDIRECT in the last 72 hours. Thyroid Function Tests: No results for input(s): TSH, T4TOTAL, FREET4, T3FREE, THYROIDAB in the last 72 hours. Anemia Panel: No results for input(s): VITAMINB12, FOLATE, FERRITIN, TIBC, IRON, RETICCTPCT in the last 72 hours. Sepsis Labs: No results for input(s): PROCALCITON, LATICACIDVEN in the last 168 hours.  No results found for this or any previous visit (from the past 240 hour(s)).    Imaging Studies   No results found.   Medications   Scheduled Meds: . enoxaparin (LOVENOX) injection  0.5 mg/kg Subcutaneous Q24H  . feeding supplement  1 Container Oral TID BM  . insulin aspart  0-20 Units Subcutaneous TID AC & HS  . mouth rinse  15 mL Mouth Rinse q12n4p  . nystatin  5 mL Oral QID  . pantoprazole  40 mg Oral BID AC  . traZODone  100 mg Oral QHS   Continuous Infusions: . sodium chloride Stopped (09/07/20 0725)       LOS: 38 days    Time spent: 15 minutes  Ezekiel Slocumb, DO [facility]  09/20/2020, 5:54 PM    If 7PM-7AM, please contact night-coverage. How to contact the [facility] Attending or Consulting provider [place] or covering provider during after hours [place], for this patient?    1. Check the care team in [facility] and look for a) attending/consulting TRH provider listed and b) the [facility] team listed 2. Log into [email or web address] and use [place] universal password to access. If you do not have the password, please contact the hospital operator. 3. Locate the [facility] provider you are looking for under [facility] and page to a number that you can be directly  reached. 4. If you still have difficulty reaching the provider, please page the [facility] (Director on Call) for the Hospitalists listed on amion for assistance.

## 2020-09-20 NOTE — Progress Notes (Signed)
Pt AAox4, VS stable, No complaints today. Up to [facility]. Pt requiring suctioning q3-4 hrs. Meds crushed in applesauce. Using communication board to express needs. No adverse events or acute changes. Pt clinically improving, however states she is unsure if she is ready to leave the hospital. Will continue to monitor.

## 2020-09-20 NOTE — Plan of Care (Signed)
Pt has no c/o pain. Is ambulating well with PT and up to [facility] independently.  She is declining meds because she feels they are not needed to help her sleep. She is taking pride in doing things for herself.  Insurance has approved transfer to CIR - just waiting on a bed.

## 2020-09-21 DIAGNOSIS — U071 COVID-19: Secondary | ICD-10-CM | POA: Diagnosis not present

## 2020-09-21 DIAGNOSIS — J9601 Acute respiratory failure with hypoxia: Secondary | ICD-10-CM | POA: Diagnosis not present

## 2020-09-21 DIAGNOSIS — Z930 Tracheostomy status: Secondary | ICD-10-CM | POA: Diagnosis not present

## 2020-09-21 LAB — GLUCOSE, CAPILLARY
Glucose-Capillary: 101 mg/dL — ABNORMAL HIGH (ref 70–99)
Glucose-Capillary: 97 mg/dL (ref 70–99)
Glucose-Capillary: 99 mg/dL (ref 70–99)
Glucose-Capillary: 108 mg/dL — ABNORMAL HIGH (ref 70–99)

## 2020-09-21 MED ORDER — ACETAMINOPHEN 325 MG PO TABS
650.0000 mg | ORAL_TABLET | Freq: Four times a day (QID) | ORAL | Status: DC | PRN
  Administered 2020-09-23: 650 mg via ORAL
  Filled 2020-09-21: qty 2

## 2020-09-21 MED ORDER — PHENOL 1.4 % MT LIQD
1.0000 | OROMUCOSAL | Status: DC | PRN
  Filled 2020-09-21: qty 177

## 2020-09-21 NOTE — TOC Progression Note (Signed)
Transition of Care [facility]) - Progression Note    Patient Details  Name: Kathy Vega MRN: [id number] Date of Birth: 1976/01/10  Transition of Care [facility]) CM/SW Contact  Shelbie Hutching, RN Phone Number: 09/21/2020, 1:26 PM  Clinical Narrative:    CIR does not have any beds at this time.  RNCM started SNF bed search and sent a referral to [facility] inpatient rehab.  Plan is preferably for the patient to still go to CIR.     Expected Discharge Plan: IP Rehab Facility Barriers to Discharge: Insurance Authorization  Expected Discharge Plan and Services Expected Discharge Plan: [place]     Post Acute Care Choice: IP Rehab Living arrangements for the past 2 months: Single Family Home                                       Social Determinants of Health (SDOH) Interventions    Readmission Risk Interventions Readmission Risk Prevention Plan 09/10/2020  Transportation Screening Complete  PCP or Specialist Appt within 3-5 Days Complete  HRI or [place] Complete  Social Work Consult for [place] Planning/Counseling Complete  Palliative Care Screening Complete  Medication Review [employer]) Complete  Some recent data might be hidden

## 2020-09-21 NOTE — Progress Notes (Signed)
Inpatient Rehab Admissions Coordinator:  There are no beds available in CIR today. TOC made aware.  Gayland Curry, [place], [place] Admissions Coordinator [phone]

## 2020-09-21 NOTE — Evaluation (Signed)
Physical Therapy Re-Evaluation Patient Details Name: Kathy Vega MRN: [id number] DOB: 01/20/1976 Today's Date: 09/21/2020   History of Present Illness  Pt is a 44 y.o. female presenting to hospital 9/16 with SOB and confusion; initial O2 sats in 20's; pt COVID (+) 6 days prior.  Mechanically intubated 9/17.  S/p trach 10/8.  Pt admitted with acute hypoxic respiratory failure secondary COVID-19 PNA and ARDS, possible OSA with upper airway swelling s/p trach, and severe acute hypoxic and hypercapnic respiratory failure.  PMH includes anemia, thyroid disease, h/o leg surgery.  Clinical Impression  D/t extended hospital stay, PT re-evaluation performed.  Improved LE strength and mobility noted compared to initial evaluation.  No c/o pain during session.  Currently pt is modified independent semi-supine to sitting edge of bed; SBA with transfers; and CGA ambulating 350 feet with no UE support (pt with slow cadence with occasional mild imbalance/loss of balance requiring CGA for safety; 4 brief standing rest breaks during ambulation).  Performed standing dynamic activities (side-stepping and walking backwards with CGA for safety).  Pt demonstrating slow/cautious movement with all activities (pt appearing to be focusing on her balance).  O2 sats 93% or greater on 3 L O2 FiO2 28% during ambulation (O2 set up by respiratory); HR 102 bpm at rest and increased up to 124 bpm with activity.  Pt would benefit from skilled PT to address noted impairments and functional limitations (see below for any additional details).  Upon hospital discharge, pt would benefit from CIR.  Plan of care reviewed and updated as appropriate.    Follow Up Recommendations CIR;Supervision/Assistance - 24 hour;Supervision for mobility/OOB    Equipment Recommendations  3in1 (PT);Rolling walker with 5" wheels    Recommendations for Other Services OT consult     Precautions / Restrictions Precautions Precautions: Fall Precaution  Comments: Trach (trach collar); HOB >30 degrees Restrictions Weight Bearing Restrictions: No      Mobility  Bed Mobility Overal bed mobility: Modified Independent Bed Mobility: Supine to Sit     Supine to sit: Modified independent (Device/Increase time);HOB elevated     General bed mobility comments: no difficulties noted    Transfers Overall transfer level: Needs assistance Equipment used: None Transfers: Sit to/from Stand Sit to Stand: Supervision         General transfer comment: x2 trials standing from bed (mild increased effort to stand but appearing strong); steady controlled descent sitting down on bed x2 trials  Ambulation/Gait Ambulation/Gait assistance: Min guard Gait Distance (Feet): 350 Feet Assistive device: None   Gait velocity: decreased   General Gait Details: mild increased B lateral sway (improved some with increased distance ambulating) with occasional mild imbalance/loss of balance noted requiring CGA for safety; slow cadance; mild decreased BOS  Stairs            Wheelchair Mobility    Modified Rankin (Stroke Patients Only)       Balance Overall balance assessment: Needs assistance Sitting-balance support: No upper extremity supported;Feet supported Sitting balance-Leahy Scale: Normal Sitting balance - Comments: steady sitting reaching outside BOS   Standing balance support: No upper extremity supported;During functional activity Standing balance-Leahy Scale: Fair Standing balance comment: pt cautious with dynamic activity with unsteadiness noted                             Pertinent Vitals/Pain Pain Assessment: No/denies pain Pain Intervention(s): Limited activity within patient's tolerance;Repositioned;Monitored during session    Home Living Family/patient expects  to be discharged to:: Private residence Living Arrangements: Spouse/significant other;Children Available Help at Discharge: Family Type of Home:  House Home Access: Level entry     Home Layout: One [place]: None      Prior Function Level of Independence: Independent         Comments: No falls in past 6 months.     Hand Dominance   Dominant Hand: Right    Extremity/Trunk Assessment   Upper Extremity Assessment Upper Extremity Assessment: Generalized weakness    Lower Extremity Assessment Lower Extremity Assessment: Generalized weakness RLE Deficits / Details: hip flexion, knee flexion/extension, and DF 4+/5; at least 3/5 PF LLE Deficits / Details: hip flexion, knee flexion/extension, and DF 4+/5; at least 3/5 PF    Cervical / Trunk Assessment Cervical / Trunk Assessment: Normal  Communication   Communication: Tracheostomy  Cognition Arousal/Alertness: Awake/alert Behavior During Therapy: WFL for tasks assessed/performed Overall Cognitive Status: Within Functional Limits for tasks assessed                                 General Comments: Pt communicating via white board, mouthing words, and giving thumbs up/down      General Comments  Pt agreeable to PT session.  Pt reports that she was hoping therapist was going to return to see her yesterday afternoon.    Exercises Total Joint Exercises Long Arc Quad: AROM;Strengthening;Both;10 reps;Seated Other Exercises Other Exercises: Standing side-stepping L and R (x2 reps each) x10 feet each trial Other Exercises: Standing walking backwards 10 feet x2   Assessment/Plan    PT Assessment Patient needs continued PT services  PT Problem List Decreased strength;Decreased activity tolerance;Decreased balance;Decreased mobility;Decreased knowledge of use of DME;Decreased knowledge of precautions;Cardiopulmonary status limiting activity;Pain;Decreased skin integrity       PT Treatment Interventions DME instruction;Gait training;Stair training;Functional mobility training;Therapeutic activities;Therapeutic exercise;Balance  training;Patient/family education;Neuromuscular re-education    PT Goals (Current goals can be found in the Care Plan section)  Acute Rehab PT Goals Patient Stated Goal: go home PT Goal Formulation: With patient Time For Goal Achievement: 10/05/20 Potential to Achieve Goals: Good    Frequency 7X/week   Barriers to discharge        Co-evaluation               AM-PAC PT "6 Clicks" Mobility  Outcome Measure Help needed turning from your back to your side while in a flat bed without using bedrails?: None Help needed moving from lying on your back to sitting on the side of a flat bed without using bedrails?: None Help needed moving to and from a bed to a chair (including a wheelchair)?: A Little Help needed standing up from a chair using your arms (e.g., wheelchair or bedside chair)?: A Little Help needed to walk in hospital room?: A Little Help needed climbing 3-5 steps with a railing? : A Little 6 Click Score: 20    End of Session Equipment Utilized During Treatment: Gait belt;Oxygen Activity Tolerance: Patient tolerated treatment well Patient left: with call bell/phone within reach;with bed alarm set (sitting on edge of bed) Nurse Communication: Mobility status;Precautions;Other (comment) (pt sitting on edge of bed) PT Visit Diagnosis: Other abnormalities of gait and mobility (R26.89);Muscle weakness (generalized) (M62.81);Difficulty in walking, not elsewhere classified (R26.2)    Time: 1610-9604 PT Time Calculation (min) (ACUTE ONLY): 55 min   Charges:   PT Evaluation $PT Re-evaluation: 1 Re-eval PT Treatments $Gait  Training: 8-22 mins $Therapeutic Exercise: 8-22 mins $Therapeutic Activity: 8-22 mins       Leitha Bleak, PT 09/21/20, 4:20 PM

## 2020-09-21 NOTE — Progress Notes (Signed)
PROGRESS NOTE    Kathy Vega   RFX:[id number]  DOB: [birth year]  PCP: [facility]    DOA: 08/13/2020 LOS: 64   Brief Narrative   "44 yo unvaccinated female admitted with COVID-19 pneumonia & ARDS requiring intubation and mechanical ventilation.   09/16: Pt admitted to ICU requiring Heated HFNC and NRB, however remained in the ER pending ICU bed availability  09/16: CT Head revealed no acute intracranial abnormality 09/17: Pt with worsening hypoxia requiring mechanical intubation 09/18: [facility] refused to accept pt for ECMO 09/21: Attempted to transfer pt to [facility] and [place] [facility] for possible assessment for ECMO, however both hospitals DO NOT have bed availability and are at capacity and pt may or may NOT be an ECMO candidate at this time  09/23: Pt unproned  09/24:  Resumed proning protocol 09/25: Resumed proning protocol  09/26: Pt unproned 9/27-  No onvernight events, patient remains in ARDS on elevated settings. 08/25/20- patient is on maximal settings with sedation. She is on day 11 of her illness with little improvement.  Ive attempted to reach husband via cell no answer, ive successfully found her daughter and spoke with her regarding care plan and she is agreeable but requested for me to speak with [name] and has provided me with secondary phone number [phone] but again only voice mail was reachable.  Have left voicemail and will discuss trache with family when they are available.  08/26/20- patient without overnight events, ive called POA husband again today but was unable to reach him and left voice mail.  08/27/20- patient critically ill with overall poor prognosis, she had desaturation event today and was difficult to improve requiring multiple changes on ventilator by RT.  08/28/20- Patient is weaned to 60% FiO2 on MV.  Have called Garlan Fillers today and updated him. We discussed trache and he is agreeable.  08/29/20- Spoke with  husband today, answered questions ,updated on care plan,  he is ready to speak with ENT regarding trache palcement 08/30/20- patient had SBT today she was awake and followed verbal communication but after 2 hours had severly labored breathing and thick high volume secretions.  I have met with husband Garlan Fillers and he was able to see patient at bedside we discussed trache. I have updated Dr Tami Ribas regarding trache plan.  08/31/20 - no airleak will need trach, discussed with Dr.Vaught. 09/01/20-Still no air leak. Patient has trach scheduled for Friday of this week and thus no need for steroids at this time 09/02/20- no air leak, continuing plan of care, no other concerns 09/03/20- very minimal air leak, continue with trach 10/8 09/04/20- Trach placed 09/07/20- Patient OOB with lift, weaned to 5/5, PICC & foley removed 09/08/20- tolerated 1 hr TC, OOB to chair without lift, pivoted on her own strength 09/09/20- patient not sleeping well O/N, bad dreams and overall feeling "down", tolerated TC 2 hours 09/11/20- patient is participating with PT while on trache collar and did well. Sutures removed. Will optimize for Stepdown status today.  FiO2 - 35%.  Pseudomonas on tracheal aspirate - plan for 7 d cefepime.  Trache size 8 switch to cuffed 6 today to allow for speech.  09/12/20- Patient stable, family came for visit. Continues to have clinical signs of improvement. Continuing current care plan. TOC consultation for post trache placement.  09/13/20- patient is improved, plan to optimize for [facility] transfer today. Will page out to [facility] for pick up this am.   10/19-did  well with MBSS, no aspiration . Started on dysphagia 3 diet. Doboff tube d/c'd "    I assumed care of patient on 09/16/20.  Patient has remained clinical stable without acute issues this week.  Awaiting CIR bed to become available.      Assessment & Plan   Active Problems:   COVID-19   CAP (community acquired pneumonia)   Acute  respiratory failure ([place])   Tracheostomy in place [facility])   Acute renal failure (ARF) (HCC)   Hypokalemia   Elevated liver enzymes   Acute encephalopathy   Diarrhea - Improved on its own, only 1 episode today.   Pt reported on 10/23, approximately 5 episodes in past day.  No fever chills or abdominal pain.  No leukocytosis.   C diff test not indicated at this time.  Monitor.  Acute hypoxic respiratory failure secondary to COVID-19 pneumonia and ARDS Morbid obesity, possible OSA S/p course of remdesevir, azithromycin & ceftriaxone.  Off airborne precautions. Tracheostomy placed 10/8.  Cefepime started 10/11 for suspected HCAP-tracheal aspriate +pseudomonas - course completed. Will need sleep study as outpatient PT OT recommending CIR - insurance authorized, but no beds currently.   Tracheostomy status Pt had severe laryngeal edema, per PCCM and ENT.   No air passing around downsized trach due to persistent edema most likely.  Unable to use speech valve or cap trach yet.   Discussed with Dr. Mortimer Fries and Dr. Tami Ribas on 10/21.  Both recommend maintaining trach as is currently. 10/23: continues to require suction every 3-4 hours.  Pt hesitant to learn self-suctioning at this time. --follow up with ENT in 2 weeks (either Dr. Tami Ribas here or [facility] if at [facility]).   --need to do trach care teaching & training with patient (self suctioning etc)   Leukocytosis-possibly due to left lower lobe pneumonia PICC &foley removed- 10/11 Improved S/p treatment with cefepime due to tracheal aspirate growing pseudomonas Monitor CBC   Troponin elevation-due to demand ischemia Hypertrophic cardiomyopathy Was treated with lasix Echo revealed EF 45-50% with severe concentric LVH, diastolic dysfunction    Mild acute renal failure -resolved Hypokalemia -resolved Hypernatremia -resolved -All remained stable  Continue monitoring periodically  Avoid nephrotoxic agents    Anemia  without obvious acute blood loss Last transfusion 9/23 H&H remained stable  Monitor Hbg & transfuse if less than 7    Hyperglycemia  Dobbhoff discontinued  On dysphagia 3 diet  Continue R-ISS    Nutrition-passed MBSS without any aspirations .Dobbhoff discontinued.  Dysphagia 3 diet (tolerating well).    Obesity: Body mass index is 35.97 kg/m.  Complicates overall care and prognosis.  DVT prophylaxis: SCDs Start: 08/13/20 2035   Diet:  Diet Orders (From admission, onward)    Start     Ordered   09/15/20 0943  DIET DYS 3 Room service appropriate? Yes with Assist; Fluid consistency: Thin  Diet effective now       Comments: Extra Gravy on meats, potatoes. Cream Soups are preferred. Yogurts.  Question Answer Comment  Room service appropriate? Yes with Assist   Fluid consistency: Thin      09/15/20 0944            Code Status: Full Code    Subjective 09/21/20    Pt seen today at bedside.  Sitting up edge of bed reading.  Says she has not been able to cough secretions up past the trach, is requesting to be suctioned.  Diarrhea resolved.  No other complaints.   Disposition Plan &  Communication   Status is: Inpatient  Remains inpatient appropriate because:Inpatient level of care appropriate due to severity of illness.  Patient with tracheostomy, long complicated admission, overall weak and deconditioned. PT recommending CIR and auth received, discharge pending bed available.  Continue inpatient close monitoring and respiratory care.   Dispo: The patient is from: Home              Anticipated d/c is to: CIR              Anticipated d/c date is: 1 days (pending available bed)              Patient currently is medically stable to d/c.    Family Communication: Husband and son at bedside during encounter, 10/23.  None at bedside when seen today.   Consults, Procedures, Significant Events   Consultants:   PCCM  Procedures:  ETT 9/17 >> 10/8 PICC 9/17 >>  10/11 Tracheostomy 10/8 >>   Antimicrobials:  Anti-infectives (From admission, onward)   Start     Dose/Rate Route Frequency Ordered Stop   09/15/20 1700  fluconazole (DIFLUCAN) tablet 100 mg  Status:  Discontinued        100 mg Oral Every 24 hours 09/15/20 1100 09/19/20 1436   09/12/20 1700  fluconazole (DIFLUCAN) tablet 100 mg  Status:  Discontinued        100 mg Oral Every 24 hours 09/11/20 1346 09/12/20 0941   09/12/20 1700  fluconazole (DIFLUCAN) tablet 100 mg  Status:  Discontinued        100 mg Per Tube Every 24 hours 09/12/20 0941 09/15/20 1100   09/11/20 1400  fluconazole (DIFLUCAN) IVPB 100 mg        100 mg 50 mL/hr over 60 Minutes Intravenous Every 24 hours 09/11/20 1310 09/11/20 1458   09/07/20 1600  ceFEPIme (MAXIPIME) 2 g in sodium chloride 0.9 % 100 mL IVPB        2 g 200 mL/hr over 30 Minutes Intravenous Every 8 hours 09/07/20 1500 09/14/20 0720   08/21/20 2200  metroNIDAZOLE (FLAGYL) IVPB 500 mg        500 mg 100 mL/hr over 60 Minutes Intravenous Every 8 hours 08/21/20 2102 08/26/20 1535   08/14/20 1000  remdesivir 100 mg in sodium chloride 0.9 % 100 mL IVPB       "Followed by" Linked Group Details   100 mg 200 mL/hr over 30 Minutes Intravenous Daily 08/13/20 2040 08/17/20 1034   08/13/20 2230  azithromycin (ZITHROMAX) 500 mg in sodium chloride 0.9 % 250 mL IVPB  Status:  Discontinued        500 mg 250 mL/hr over 60 Minutes Intravenous Every 24 hours 08/13/20 2102 08/17/20 1021   08/13/20 2200  cefTRIAXone (ROCEPHIN) 2 g in sodium chloride 0.9 % 100 mL IVPB  Status:  Discontinued        2 g 200 mL/hr over 30 Minutes Intravenous Every 24 hours 08/13/20 2102 08/17/20 1021   08/13/20 2045  remdesivir 200 mg in sodium chloride 0.9% 250 mL IVPB       "Followed by" Linked Group Details   200 mg 580 mL/hr over 30 Minutes Intravenous Once 08/13/20 2040 08/14/20 0216         Objective   Vitals:   09/21/20 0423 09/21/20 0835 09/21/20 1140 09/21/20 1604  BP: 119/84  120/72 108/83 115/72  Pulse: (!) 103 82 (!) 110 (!) 103  Resp: _0 Temp: 97.8 F (36.6 C) (!)  97.4 F (36.3 C) 97.7 F (36.5 C) 99.2 F (37.3 C)  TempSrc: Axillary Oral Axillary Axillary  SpO2: 98% 98% 91% 100%  Weight:      Height:        Intake/Output Summary (Last 24 hours) at 09/21/2020 1613 Last data filed at 09/21/2020 1012 Gross per 24 hour  Intake 120 ml  Output --  Net 120 ml   Filed Weights   09/18/20 0516 09/19/20 0500 09/20/20 0500  Weight: 109.3 kg 107.4 kg 110.9 kg    Physical Exam:  General exam: awake, alert, sitting edge of bed, no acute distress, obese HEENT: tracheostomy with trach collar oxygen at 5 L/min, secretions visible after coughing Respiratory system: CTAB with referred upper airway secretion sounds, normal respiratory effort Cardiovascular system: normal S1/S2, RRR, no edema.      Labs   Data Reviewed: I have personally reviewed following labs and imaging studies  CBC: Recent Labs  Lab 09/15/20 0530 09/16/20 0627 09/19/20 0435  WBC 7.8 6.3 6.6  NEUTROABS 5.7 4.4  --   HGB 10.2* 9.9* 10.6*  HCT 32.4* 32.4* 33.6*  MCV 80.2 80.4 78.9*  PLT 382 357 062   Basic Metabolic Panel: Recent Labs  Lab 09/15/20 0530 09/16/20 0627 09/19/20 0435  NA 141 138 138  K 4.0 3.8 4.0  CL 104 101 102  CO2 _0 GLUCOSE 108* 112* 102*  BUN 21* 17 10  CREATININE 0.78 0.86 0.86  CALCIUM 10.0 10.0 10.0  MG 2.4 2.3  --   PHOS 3.8 3.7  --    GFR: Estimated Creatinine Clearance: 109.9 mL/min (by C-G formula based on SCr of 0.86 mg/dL). Liver Function Tests: No results for input(s): AST, ALT, ALKPHOS, BILITOT, PROT, ALBUMIN in the last 168 hours. No results for input(s): LIPASE, AMYLASE in the last 168 hours. No results for input(s): AMMONIA in the last 168 hours. Coagulation Profile: No results for input(s): INR, PROTIME in the last 168 hours. Cardiac Enzymes: No results for input(s): CKTOTAL, CKMB, CKMBINDEX, TROPONINI in the  last 168 hours. BNP (last 3 results) No results for input(s): PROBNP in the last 8760 hours. HbA1C: No results for input(s): HGBA1C in the last 72 hours. CBG: Recent Labs  Lab 09/20/20 1617 09/20/20 1954 09/21/20 0810 09/21/20 1157 09/21/20 1603  GLUCAP 96 113* 101* 97 99   Lipid Profile: No results for input(s): CHOL, HDL, LDLCALC, TRIG, CHOLHDL, LDLDIRECT in the last 72 hours. Thyroid Function Tests: No results for input(s): TSH, T4TOTAL, FREET4, T3FREE, THYROIDAB in the last 72 hours. Anemia Panel: No results for input(s): VITAMINB12, FOLATE, FERRITIN, TIBC, IRON, RETICCTPCT in the last 72 hours. Sepsis Labs: No results for input(s): PROCALCITON, LATICACIDVEN in the last 168 hours.  No results found for this or any previous visit (from the past 240 hour(s)).    Imaging Studies   No results found.   Medications   Scheduled Meds: . enoxaparin (LOVENOX) injection  0.5 mg/kg Subcutaneous Q24H  . feeding supplement  1 Container Oral TID BM  . insulin aspart  0-20 Units Subcutaneous TID AC & HS  . mouth rinse  15 mL Mouth Rinse q12n4p  . nystatin  5 mL Oral QID  . pantoprazole  40 mg Oral BID AC  . traZODone  100 mg Oral QHS   Continuous Infusions: . sodium chloride Stopped (09/07/20 0725)       LOS: 39 days    Time spent: 15 minutes    Ezekiel Slocumb, DO [facility]  09/21/2020, 4:13 PM    If 7PM-7AM, please contact night-coverage. How to contact the [facility] Attending or Consulting provider [place] or covering provider during after hours [place], for this patient?    1. Check the care team in [facility] and look for a) attending/consulting TRH provider listed and b) the [facility] team listed 2. Log into [email or web address] and use 's universal password to access. If you do not have the password, please contact the hospital operator. 3. Locate the [facility] provider you are looking for under [facility] and page to a number that you can be directly  reached. 4. If you still have difficulty reaching the provider, please page the [facility] (Director on Call) for the Hospitalists listed on amion for assistance.

## 2020-09-21 NOTE — Progress Notes (Addendum)
Speech Language Pathology Treatment: Nada Boozer Speaking valve  Patient Details Name: Kathy Vega MRN: [id number] DOB: October 03, 1976 Today's Date: 09/21/2020 Time: 1010-1055 SLP Time Calculation (min) (ACUTE ONLY): 45 min  Assessment / Plan / Recommendation Clinical Impression  Pt seen today forongoingPMVassessment; A/O x4 and using writing and/or mouthing and gestures for communication w/ others. Pt is tolerating her Mech Soft/Regular consistency diet w/ Thin liquids -- No decline in pulmonary status or overt, clinical s/s of aspiration noted by NSG. Recommend continue a Dysphagia level 3 diet (mech soft for the Cut meats, cooked foods pt stated was "easier") w/ gravies added to moisten foods; Thin liquids. Recommend general aspiration precautions; Pills in Puree for ease of swallowing/clearing; positioning assistance as needed for meals. ST services will be available if new swallowing needs arise while admitted. NSG updated. Precautions posted at bedside.   Pt's Tracheostomy was placed on 09/04/2020.Reviewed/discussed w/ pt the PMV, its use for communication, and its care again. Discussed the change in air flow w/ a tracheostomy and purpose of the PMV for verbal communication as well as its role in swallowing. Pt has a Shiley #6XL,cuffed; cuff DEFLATED, pt on trach collar at Baseline. Finger occlusion was attempted initially and coughing encouraged to clear min+ mucous/phlegm(tracheal). Discussed the importance of coughing own secretions to clear vs being frequently suctioned by NSG(tracheal suctioning) d/t the irritation and increase in mucous. Slight/minredirected, superior airflow noted. Upon placing the PMV, pt again was only able to tolerate ~30-45 seconds w/out discomfort and air stacking noted. Upon removing the PMV, a rush of airflow was noted at the level of the trach though min less than last week. This was attempted x3 w/ same results. PMV was removed, cleaned. Pt endorsed discomfort in  breathing; unable to exhale sufficiently. RR remained ather baseline during/post removal of PMV w/ no increased in effort/WOB.  Discussion was had w/ pt re: inability to tolerate wearing of the PMV at this time and potential issues such as Edema(as per MD consultation);NSGagreed. Pt is able to redirect slight/min airflow today which is an improvement from previous days though not sufficient to wear the PMV appropriately, or comfortably. Pt'simproved medical status overallbodeswell for improvement for toleration of PMV placement/wear. Thrush tx completed. Discussed frequent oral care(practiced w/ model). ST services will continue to follow for monitoring of appropriateness for PMV use/wear.  Discussed w/ MD the option of pt being able to move to a Shorter trach vs the XL length to see if this would make a difference for PMV tolerance. MD will f/u w/ ENT/Pulmonary. NSG updated. Pt agreed.    HPI HPI: (08/13/2020) Pt is a 44 yo female with a PMH of Hypothyroidism, morbid Obesity, and Anemia.  She presented to [facility] ER on 09/16 with c/o shortness of breath, headache, and confusion.  Upon arrival to the ER pts O2 sats were in the 20's she was placed on 5L of O2 via nasal canula, however her O2 sats only increased to 29%.  She was diagnosed with COVID-19 6 days prior to ER presentation, and she is unvaccinated.  She was placed on NRB and heated HFNC with increase in O2 sats to 93-95%.  Pt also noted to be confused and uncooperative likely secondary to taking a significant amount of cough syrup with codeine at home.  CT Head negative.  CXR revealed bilateral infiltrates.  PCCM team contacted for ICU admission.  She was orally intubated 08/14/2020 w/ OGT; traceheostomy 09/04/2020 w/ Dobhoff present for TFs support.  Pt has weaned  to [employer] trials during the day w/ vent support at night as needed.       SLP Plan  Continue with current plan of care       Recommendations         Patient may use  Passy-Muir Speech Valve: with SLP only PMSV Supervision: Full MD: Please consider changing trach tube to :  (consider a shorter trach vs XL)         General recommendations:  (Rehab) Oral Care Recommendations: Oral care BID;Patient independent with oral care Follow up Recommendations: Inpatient Rehab (vs SNF vs Home for monitoring of PMV use/status) SLP Visit Diagnosis: Aphonia (R49.1) (tracheostomy) Plan: Continue with current plan of care       [place], [place], [place] Pathologist Rehab Services [phone] [facility] 09/21/2020, 1:53 PM

## 2020-09-21 NOTE — Progress Notes (Signed)
OT Cancellation Note  Patient Details Name: Kathy Vega MRN: [id number] DOB: 1976-09-13   Cancelled Treatment:    Reason Eval/Treat Not Completed: Other (comment). Pt off the unit with family celebrating her birthday. OT will follow up as time allows.  Darleen Crocker, MS, OTR/L , CBIS ascom [phone]  09/21/20, 1:59 PM   09/21/2020, 1:58 PM

## 2020-09-22 DIAGNOSIS — U071 COVID-19: Secondary | ICD-10-CM | POA: Diagnosis not present

## 2020-09-22 DIAGNOSIS — Z930 Tracheostomy status: Secondary | ICD-10-CM | POA: Diagnosis not present

## 2020-09-22 DIAGNOSIS — J9601 Acute respiratory failure with hypoxia: Secondary | ICD-10-CM | POA: Diagnosis not present

## 2020-09-22 LAB — BASIC METABOLIC PANEL
Anion gap: 10 (ref 5–15)
BUN: 12 mg/dL (ref 6–20)
CO2: 25 mmol/L (ref 22–32)
Calcium: 9.9 mg/dL (ref 8.9–10.3)
Chloride: 102 mmol/L (ref 98–111)
Creatinine, Ser: 0.8 mg/dL (ref 0.44–1.00)
GFR, Estimated: >60 mL/min (ref >60)
Glucose, Bld: 89 mg/dL (ref 70–99)
Potassium: 3.8 mmol/L (ref 3.5–5.1)
Sodium: 137 mmol/L (ref 135–145)

## 2020-09-22 LAB — CBC
HCT: 32.9 % — ABNORMAL LOW (ref 36.0–46.0)
Hemoglobin: 10.4 g/dL — ABNORMAL LOW (ref 12.0–15.0)
MCH: 25.2 pg — ABNORMAL LOW (ref 26.0–34.0)
MCHC: 31.6 g/dL (ref 30.0–36.0)
MCV: 79.9 fL — ABNORMAL LOW (ref 80.0–100.0)
Platelets: 349 10*3/uL (ref 150–400)
RBC: 4.12 MIL/uL (ref 3.87–5.11)
RDW: 25.2 % — ABNORMAL HIGH (ref 11.5–15.5)
WBC: 5.6 10*3/uL (ref 4.0–10.5)
nRBC: 0 % (ref 0.0–0.2)

## 2020-09-22 LAB — MRSA PCR SCREENING: MRSA by PCR: NEGATIVE

## 2020-09-22 LAB — GLUCOSE, CAPILLARY
Glucose-Capillary: 85 mg/dL (ref 70–99)
Glucose-Capillary: 95 mg/dL (ref 70–99)
Glucose-Capillary: 101 mg/dL — ABNORMAL HIGH (ref 70–99)

## 2020-09-22 NOTE — Progress Notes (Signed)
Physical Therapy Treatment Patient Details Name: Kathy Vega MRN: [id number] DOB: 08/28/1976 Today's Date: 09/22/2020    History of Present Illness Pt is a 44 y.o. female presenting to hospital 9/16 with SOB and confusion; initial O2 sats in 20's; pt COVID (+) 6 days prior.  Mechanically intubated 9/17.  S/p trach 10/8.  Pt admitted with acute hypoxic respiratory failure secondary COVID-19 PNA and ARDS, possible OSA with upper airway swelling s/p trach, and severe acute hypoxic and hypercapnic respiratory failure.  PMH includes anemia, thyroid disease, h/o leg surgery.    PT Comments    Pt sitting edge of bed upon PT arrival.  Respiratory came to adjust O2 needs/set-up for ambulation.  Initially pt 98-99% O2 on 5 L O2 FiO2 28% on trach collar at rest.  Respiratory trialed pt on room air but pt's O2 sats noted to be 90-91% on room air so respiratory switched pt to 6 L O2 with FiO2 28% for ambulation (with O2 sats 98% or greater).  Able to progress ambulation to 500 feet (pt with slow cadence and decreased B LE step length: attempted to cue pt to take longer steps but pt gesturing feeling off-balance and unable to take longer steps).  HR 105-129 bpm with sessions activities.  Will continue to focus on strengthening, balance, and progressive functional mobility.   Follow Up Recommendations  CIR;Supervision/Assistance - 24 hour;Supervision for mobility/OOB     Equipment Recommendations  3in1 (PT);Rolling walker with 5" wheels    Recommendations for Other Services OT consult     Precautions / Restrictions Precautions Precautions: Fall Precaution Comments: Trach (trach collar); HOB >30 degrees Restrictions Weight Bearing Restrictions: No    Mobility  Bed Mobility Overal bed mobility: Modified Independent Bed Mobility: Supine to Sit     Supine to sit: Modified independent (Device/Increase time);HOB elevated     General bed mobility comments: no difficulties  noted  Transfers Overall transfer level: Needs assistance Equipment used: None Transfers: Sit to/from Stand Sit to Stand: Supervision         General transfer comment: x1 trial standing from bed (very mild increased effort to stand; appearing strong)  Ambulation/Gait Ambulation/Gait assistance: Min guard Gait Distance (Feet): 500 Feet Assistive device: None   Gait velocity: decreased   General Gait Details: decreased B LE step length; mild decreased R LE stance time (pt reports h/o R knee issues); attempted to cue pt to take longer steps but pt gesturing feeling off-balance and unable to take longer steps; mild increased B lateral sway   Stairs             Wheelchair Mobility    Modified Rankin (Stroke Patients Only)       Balance Overall balance assessment: Needs assistance Sitting-balance support: No upper extremity supported;Feet supported Sitting balance-Leahy Scale: Normal Sitting balance - Comments: steady sitting reaching outside BOS   Standing balance support: No upper extremity supported;During functional activity Standing balance-Leahy Scale: Fair Standing balance comment: pt cautious with dynamic activity with unsteadiness noted                            Cognition Arousal/Alertness: Awake/alert Behavior During Therapy: WFL for tasks assessed/performed Overall Cognitive Status: Within Functional Limits for tasks assessed                                 General Comments: Pt communicating via white  board, mouthing words, and gestures      Exercises Total Joint Exercises Marching in Standing: AROM;Strengthening;Both;10 reps;Standing (L UE support on counter for balance)    General Comments   Nursing cleared pt for participation in physical therapy.  Pt agreeable to PT session.      Pertinent Vitals/Pain Pain Assessment: Faces Pain Score: 2  Pain Location: trach site discomfort Pain Descriptors / Indicators:  Discomfort Pain Intervention(s): Limited activity within patient's tolerance;Monitored during session;Repositioned    Home Living                      Prior Function            PT Goals (current goals can now be found in the care plan section) Acute Rehab PT Goals Patient Stated Goal: go home PT Goal Formulation: With patient Time For Goal Achievement: 10/05/20 Potential to Achieve Goals: Good Progress towards PT goals: Progressing toward goals    Frequency    7X/week      PT Plan Current plan remains appropriate    Co-evaluation              AM-PAC PT "6 Clicks" Mobility   Outcome Measure  Help needed turning from your back to your side while in a flat bed without using bedrails?: None Help needed moving from lying on your back to sitting on the side of a flat bed without using bedrails?: None Help needed moving to and from a bed to a chair (including a wheelchair)?: A Little Help needed standing up from a chair using your arms (e.g., wheelchair or bedside chair)?: A Little Help needed to walk in hospital room?: A Little Help needed climbing 3-5 steps with a railing? : A Little 6 Click Score: 20    End of Session Equipment Utilized During Treatment: Gait belt;Oxygen Activity Tolerance: Patient tolerated treatment well Patient left:  (standing at counter with nurse present) Nurse Communication: Mobility status;Precautions PT Visit Diagnosis: Other abnormalities of gait and mobility (R26.89);Muscle weakness (generalized) (M62.81);Difficulty in walking, not elsewhere classified (R26.2)     Time: 0272-5366 PT Time Calculation (min) (ACUTE ONLY): 42 min  Charges:  $Gait Training: 8-22 mins $Therapeutic Exercise: 8-22 mins $Therapeutic Activity: 8-22 mins                    Leitha Bleak, PT 09/22/20, 3:28 PM

## 2020-09-22 NOTE — Progress Notes (Signed)
Nutrition Follow-up  DOCUMENTATION CODES:   Obesity unspecified  INTERVENTION:  Continue Boost Breeze po TID, each supplement provides 250 kcal and 9 grams of protein. Encouraged intake of supplements.  Continue Magic cup TID with meals, each supplement provides 290 kcal and 9 grams of protein.  NUTRITION DIAGNOSIS:   Increased nutrient needs related to catabolic illness ([id number]) as evidenced by estimated needs.  Ongoing.  GOAL:   Patient will meet greater than or equal to 90% of their needs  Met with calories but still progressing with protein.  MONITOR:   PO intake, Supplement acceptance, Labs, Weight trends, I & O's  REASON FOR ASSESSMENT:   Ventilator, Consult Enteral/tube feeding initiation and management  ASSESSMENT:   43 year old female admitted with COVID-19 PNA.  9/17 intubated 10/8 s/p tracheostomy tube placement 10/18 transferred from ICU to med/surg 10/19 s/p MBSS: diet advanced to dysphagia 3 with thin liquids  Met with patient at bedside. She is in good spirits and reports her appetite is good and she is eating well at meals. She is tolerating dysphagia 3 diet well. PO intake is varied between 50-100%. Patient has not yet tried [employer] but is Physicist, medical Cup TID with meals and enjoys these. In the past 24 hours patient has had approximately 2857 kcal (100% estimated needs) and 88 grams of protein (68% minimum estimated needs). Discussed increased protein needs over what patient is able to eat at meals. Ideally would provide Ensure Max Protein to help patient meet needs but she does not want to try these. After discussing options available on formulary patient is amenable to trying the Boost Breeze. RD brought patient a peach flavored Boost Breeze to her room to try but patient reports she would like to try later. Encouraged adequate intake of protein at meals.  Medications reviewed and include: Protonix.  Labs reviewed: CBG 85-108.  I/O: 2  occurrences unmeasured UOP yesterday  Weight trend: 110.9 kg on 10/24; -9.2 kg from 9/20  Diet Order:   Diet Order            DIET DYS 3 Room service appropriate? Yes with Assist; Fluid consistency: Thin  Diet effective now                EDUCATION NEEDS:   No education needs have been identified at this time  Skin:  Skin Assessment: Skin Integrity Issues: Skin Integrity Issues:: DTI, Other (Comment) DTI: buttocks Other: blister to buttocks  Last BM:  09/21/2020 - large type 6  Height:   Ht Readings from Last 1 Encounters:  09/04/20 5' 9.13" (1.756 m)   Weight:   Wt Readings from Last 1 Encounters:  09/20/20 110.9 kg   Ideal Body Weight:  65.9 kg  BMI:  Body mass index is 35.97 kg/m.  Estimated Nutritional Needs:   Kcal:  2600-2800  Protein:  130-140  Fluid:  >/= 2 L/day  Jacklynn Barnacle, MS, RD, LDN Pager number available on Amion

## 2020-09-22 NOTE — Progress Notes (Signed)
Occupational Therapy Re-Evaluation Patient Details Name: ILO BEAMON MRN: [id number] DOB: [birth year] Today's Date: 09/22/2020    History of present illness Pt is a 44 y.o. female presenting to hospital 9/16 with SOB and confusion; initial O2 sats in 20's; pt COVID (+) 6 days prior.  Mechanically intubated 9/17.  S/p trach 10/8.  Pt admitted with acute hypoxic respiratory failure secondary COVID-19 PNA and ARDS, possible OSA with upper airway swelling s/p trach, and severe acute hypoxic and hypercapnic respiratory failure.  PMH includes anemia, thyroid disease, h/o leg surgery.   OT comments  Pt seen for re-evaluation with pt making steady progress towards occupational therapy goals. Pt seated on EOB finishing breakfast and agreeable to OT intervention. Pt requesting to wash this session and change hospital gown. Pt standing at sink with supervision for safety for UB bathing. Pt needing min cuing to sit for safety with LB bathing and dressing tasks. Pt at first attempting to stand and hang onto counter for support with wet hand. Pt able to tolerate standing tasks for ~ 30 minutes before fatiguing. Pt seated on hospital bed and therapist assisting pt with thoroughly washing feet and donning B socks. Pt dons hospital gown with set up A. Pt utilized dry erase board for communication this session. Pt returned to bed at end of session to rest. Goals upgraded to reflect pt's progress. All needs within reach upon exiting the room.    Follow Up Recommendations  CIR    Equipment Recommendations  Other (comment) (defer to next venue of care)    Recommendations for Other Services Rehab consult    Precautions / Restrictions Precautions Precautions: Fall Precaution Comments: Trach (trach collar); HOB >30 degrees Restrictions Weight Bearing Restrictions: No       Mobility Bed Mobility Overal bed mobility: Modified Independent Bed Mobility: Supine to Sit     Supine to sit: Modified independent  (Device/Increase time);HOB elevated     General bed mobility comments: seated on EOB upon entering the room  Transfers Overall transfer level: Needs assistance Equipment used: None Transfers: Sit to/from Stand Sit to Stand: Supervision         General transfer comment: x1 trial standing from bed (very mild increased effort to stand; appearing strong)    Balance Overall balance assessment: Needs assistance Sitting-balance support: No upper extremity supported;Feet supported Sitting balance-Leahy Scale: Normal Sitting balance - Comments: steady sitting reaching outside BOS   Standing balance support: No upper extremity supported;During functional activity Standing balance-Leahy Scale: Fair Standing balance comment: pt cautious with dynamic activity with unsteadiness noted           ADL either performed or assessed with clinical judgement   ADL Overall ADL's : Needs assistance/impaired Eating/Feeding: Supervision/ safety;Sitting       Upper Body Bathing: Supervision/ safety;Standing   Lower Body Bathing: Min guard;Sit to/from stand   Upper Body Dressing : Supervision/safety;Standing   Lower Body Dressing: Min guard;Sit to/from stand            Vision Patient Visual Report: No change from baseline            Cognition Arousal/Alertness: Awake/alert Behavior During Therapy: WFL for tasks assessed/performed Overall Cognitive Status: Within Functional Limits for tasks assessed      General Comments: Pt communicated via white board        Exercises Total Joint Exercises Marching in Standing: AROM;Strengthening;Both;10 reps;Standing (L UE support on counter for balance)           Pertinent  Vitals/ Pain       Pain Assessment: Faces Pain Score: 2  Faces Pain Scale: No hurt Pain Location: trach site discomfort Pain Descriptors / Indicators: Discomfort Pain Intervention(s): Limited activity within patient's tolerance;Monitored during  session;Repositioned         Frequency  Min 2X/week        Progress Toward Goals  OT Goals(current goals can now be found in the care plan section)  Progress towards OT goals: Progressing toward goals  Acute Rehab OT Goals Patient Stated Goal: go home OT Goal Formulation: With patient Time For Goal Achievement: 10/06/20 Potential to Achieve Goals: Good ADL Goals Pt Will Perform Grooming: with modified independence;standing Pt Will Perform Upper Body Bathing: with modified independence;standing Pt Will Perform Lower Body Dressing: sit to/from stand;with modified independence Pt Will Transfer to Toilet: with modified independence;ambulating Pt Will Perform Toileting - Clothing Manipulation and hygiene: with modified independence;sit to/from stand  Plan Discharge plan remains appropriate       AM-PAC OT "6 Clicks" Daily Activity     Outcome Measure   Help from another person eating meals?: A Little Help from another person taking care of personal grooming?: A Little Help from another person toileting, which includes using toliet, bedpan, or urinal?: A Little Help from another person bathing (including washing, rinsing, drying)?: A Little Help from another person to put on and taking off regular upper body clothing?: A Little Help from another person to put on and taking off regular lower body clothing?: A Little 6 Click Score: 18    End of Session    OT Visit Diagnosis: Muscle weakness (generalized) (M62.81);Unsteadiness on feet (R26.81)   Activity Tolerance Patient tolerated treatment well   Patient Left in bed;with call bell/phone within reach   Nurse Communication Mobility status        Time: 1005-1051 OT Time Calculation (min): 46 min  Charges: OT General Charges $OT Visit: 1 Visit OT Evaluation $OT Re-eval: 1 Re-eval OT Treatments $Self Care/Home Management : 23-37 mins  Darleen Crocker, MS, OTR/L , CBIS ascom [phone]  09/22/20, 3:45  PM

## 2020-09-22 NOTE — Progress Notes (Signed)
Patient refused her 1400 Lovenox dose and she refuses her Boost Breeze as well.

## 2020-09-22 NOTE — Progress Notes (Signed)
[facility] re[facility] - Patient is doing well with therapies.  Beds on inpatient rehab have been extremely limited.  Please feel free to pursue other inpatient rehab venues or SNF placement if patient desires.  Could also consider DC home with [facility] therapies since she is doing very well with therapies.  [phone]

## 2020-09-22 NOTE — Progress Notes (Signed)
PROGRESS NOTE    Kathy Vega   CZY:[id number]  DOB: 01-27-1976  PCP: [facility] [place]    DOA: 08/13/2020 LOS: 40   Brief Narrative   44 yo unvaccinated female admitted on 08/13/20 with COVID-19 pneumonia & ARDS requiring intubation on 9/17 and mechanical ventilation.  While in ICU, attempts made to transfer to [facility] or [facility] for ECMO but no beds were available and it was not certain patient would be candidate.  Proning protocols were followed while in ICU.  Patient required maximal ventilation support with sedation and remained critically ill.  She was weaned to FiO2 60% on 10/1.  Tracheostomy was placed on 09/04/20.  Patient began tolerating trach collar O2 in short durations 10/12-13 and began OOB to chair.   Started working with PT on 10/15.  Treated with 7 days Cefepime due to tracheal aspirate culture growing Pseudomonas. Hospitalist service assumed care of patient on 10/18.   Evaluated by SLP and doing well with dysphagia 3 diet.    So far trach has not been able to be downsized since 10/15 when size 8 trach was changed to cuffed size 6.  Patient is still unable to pass enough air around trach yet to be able to use speech valve.  Per pulmonology and ENT, patient had very significant laryngeal edema, in addition to body habitus being a complicating factor with likely underlying sleep apnea and/or alveolar hypoventilation.       I assumed care of patient on 09/16/20.  Patient has remained clinical stable without acute issues this week.  Awaiting CIR bed to become available, SNF bed search underway as alternate disposition to CIR.        Assessment & Plan   Active Problems:   COVID-19   CAP (community acquired pneumonia)   Acute respiratory failure ([place])   Tracheostomy in place [facility])   Acute renal failure (ARF) (HCC)   Hypokalemia   Elevated liver enzymes   Acute encephalopathy   Diarrhea - Resolved on its own.  Pt reported on 10/23, approximately 5  episodes in past day, by next day had only single episode.  No fever chills or abdominal pain.  No leukocytosis.   C diff test not indicated at this time.  Monitor.   Acute hypoxic respiratory failure secondary to COVID-19 pneumonia and ARDS Morbid obesity, possible OSA, possible alveolar hypoventilation (OHS) S/p course of remdesevir, azithromycin & ceftriaxone.  Off airborne precautions. Tracheostomy placed 10/8.  Cefepime started 10/11 for suspected HCAP-tracheal aspriate +pseudomonas - course completed. Will need sleep study as outpatient PT OT recommending CIR - insurance authorized, but no beds currently.   Tracheostomy status Pt had severe laryngeal edema, per PCCM and ENT.   No air passing around downsized trach due to persistent edema most likely.  Unable to use speech valve or cap trach yet.   Discussed with Dr. Mortimer Fries and Dr. Tami Ribas on 10/21.  Both recommend maintaining trach as is currently. 10/23: continues to require suction every 3-4 hours.  Pt hesitant to learn self-suctioning at this time. 10/26: pt unable to cough secretions up past trach to use Yonker for oral suction, still needs deep suctioning, but hesitant to be trained to do herself so far. --follow up with ENT in 2 weeks (either Dr. Tami Ribas here or [facility] if at [facility]).   --need to do trach care teaching & training with patient (self suctioning etc)   Leukocytosis-possibly due to left lower lobe pneumonia PICC &foley removed- 10/11 Improved S/p  treatment with cefepime due to tracheal aspirate growing pseudomonas Monitor CBC   Troponin elevation-due to demand ischemia Hypertrophic cardiomyopathy Was treated with lasix Echo revealed EF 45-50% with severe concentric LVH, diastolic dysfunction    Mild acute renal failure -resolved Hypokalemia -resolved Hypernatremia -resolved -All remained stable  Continue monitoring periodically  Avoid nephrotoxic agents    Anemia without obvious acute  blood loss Last transfusion 9/23 H&H remained stable  Monitor Hbg & transfuse if less than 7    Hyperglycemia  Dobbhoff discontinued  On dysphagia 3 diet  Continue R-ISS    Nutrition-passed MBSS without any aspirations .Dobbhoff discontinued.  Dysphagia 3 diet (tolerating well).    Obesity: Body mass index is 35.97 kg/m.  Complicates overall care and prognosis.  DVT prophylaxis: SCDs Start: 08/13/20 2035   Diet:  Diet Orders (From admission, onward)    Start     Ordered   09/15/20 0943  DIET DYS 3 Room service appropriate? Yes with Assist; Fluid consistency: Thin  Diet effective now       Comments: Extra Gravy on meats, potatoes. Cream Soups are preferred. Yogurts.  Question Answer Comment  Room service appropriate? Yes with Assist   Fluid consistency: Thin      09/15/20 0944            Code Status: Full Code    Subjective 09/22/20    Pt seen today at bedside getting ready to walk with PT.  She reports feeling okay today.  No more diarrhea.  No acute issues.   Disposition Plan & Communication   Status is: Inpatient  Remains inpatient appropriate because:Inpatient level of care appropriate due to severity of illness.  Patient with tracheostomy, long complicated admission.  PT recommending CIR and auth received, discharge pending bed available.  Continue inpatient close monitoring and respiratory care.   Dispo: The patient is from: Home              Anticipated d/c is to: CIR vs SNF              Anticipated d/c date is: 1 days (pending available bed)              Patient currently is medically stable to d/c.    Family Communication: Husband and son at bedside during encounter, 10/23.  Unable to reach husband by phone past few days.  Consults, Procedures, Significant Events   Consultants:   PCCM  Procedures:  ETT 9/17 >> 10/8 PICC 9/17 >> 10/11 Tracheostomy 10/8 >>   Antimicrobials:  Anti-infectives (From admission, onward)   Start      Dose/Rate Route Frequency Ordered Stop   09/15/20 1700  fluconazole (DIFLUCAN) tablet 100 mg  Status:  Discontinued        100 mg Oral Every 24 hours 09/15/20 1100 09/19/20 1436   09/12/20 1700  fluconazole (DIFLUCAN) tablet 100 mg  Status:  Discontinued        100 mg Oral Every 24 hours 09/11/20 1346 09/12/20 0941   09/12/20 1700  fluconazole (DIFLUCAN) tablet 100 mg  Status:  Discontinued        100 mg Per Tube Every 24 hours 09/12/20 0941 09/15/20 1100   09/11/20 1400  fluconazole (DIFLUCAN) IVPB 100 mg        100 mg 50 mL/hr over 60 Minutes Intravenous Every 24 hours 09/11/20 1310 09/11/20 1458   09/07/20 1600  ceFEPIme (MAXIPIME) 2 g in sodium chloride 0.9 % 100 mL IVPB  2 g 200 mL/hr over 30 Minutes Intravenous Every 8 hours 09/07/20 1500 09/14/20 0720   08/21/20 2200  metroNIDAZOLE (FLAGYL) IVPB 500 mg        500 mg 100 mL/hr over 60 Minutes Intravenous Every 8 hours 08/21/20 2102 08/26/20 1535   08/14/20 1000  remdesivir 100 mg in sodium chloride 0.9 % 100 mL IVPB       "Followed by" Linked Group Details   100 mg 200 mL/hr over 30 Minutes Intravenous Daily 08/13/20 2040 08/17/20 1034   08/13/20 2230  azithromycin (ZITHROMAX) 500 mg in sodium chloride 0.9 % 250 mL IVPB  Status:  Discontinued        500 mg 250 mL/hr over 60 Minutes Intravenous Every 24 hours 08/13/20 2102 08/17/20 1021   08/13/20 2200  cefTRIAXone (ROCEPHIN) 2 g in sodium chloride 0.9 % 100 mL IVPB  Status:  Discontinued        2 g 200 mL/hr over 30 Minutes Intravenous Every 24 hours 08/13/20 2102 08/17/20 1021   08/13/20 2045  remdesivir 200 mg in sodium chloride 0.9% 250 mL IVPB       "Followed by" Linked Group Details   200 mg 580 mL/hr over 30 Minutes Intravenous Once 08/13/20 2040 08/14/20 0216         Objective   Vitals:   09/21/20 2349 09/22/20 0454 09/22/20 0741 09/22/20 1249  BP: 115/78 118/81 101/71 111/75  Pulse: 99 (!) 110 87 (!) 108  Resp: 20 20 17 16   Temp: (!) 97.2 F (36.2 C)  98.5 F (36.9 C) 98.6 F (37 C) 98.2 F (36.8 C)  TempSrc: Axillary Axillary  Oral  SpO2: 100% 98% 99% 100%  Weight:      Height:        Intake/Output Summary (Last 24 hours) at 09/22/2020 1559 Last data filed at 09/22/2020 1434 Gross per 24 hour  Intake 480 ml  Output --  Net 480 ml   Filed Weights   09/18/20 0516 09/19/20 0500 09/20/20 0500  Weight: 109.3 kg 107.4 kg 110.9 kg    Physical Exam:  General exam: awake, alert, standing with PT, no acute distress, obese HEENT: tracheostomy with trach collar on room air, no visible secretions Respiratory system: diffuse referred upper airway secretion sounds, normal respiratory effort Cardiovascular system: normal S1/S2, RRR, no edema.      Labs   Data Reviewed: I have personally reviewed following labs and imaging studies  CBC: Recent Labs  Lab 09/16/20 0627 09/19/20 0435 09/22/20 0412  WBC 6.3 6.6 5.6  NEUTROABS 4.4  --   --   HGB 9.9* 10.6* 10.4*  HCT 32.4* 33.6* 32.9*  MCV 80.4 78.9* 79.9*  PLT 357 347 093   Basic Metabolic Panel: Recent Labs  Lab 09/16/20 0627 09/19/20 0435 09/22/20 0412  NA 138 138 137  K 3.8 4.0 3.8  CL 101 102 102  CO2 27 26 25   GLUCOSE 112* 102* 89  BUN 17 10 12   CREATININE 0.86 0.86 0.80  CALCIUM 10.0 10.0 9.9  MG 2.3  --   --   PHOS 3.7  --   --    GFR: Estimated Creatinine Clearance: 118.2 mL/min (by C-G formula based on SCr of 0.8 mg/dL). Liver Function Tests: No results for input(s): AST, ALT, ALKPHOS, BILITOT, PROT, ALBUMIN in the last 168 hours. No results for input(s): LIPASE, AMYLASE in the last 168 hours. No results for input(s): AMMONIA in the last 168 hours. Coagulation Profile: No results for input(s):  INR, PROTIME in the last 168 hours. Cardiac Enzymes: No results for input(s): CKTOTAL, CKMB, CKMBINDEX, TROPONINI in the last 168 hours. BNP (last 3 results) No results for input(s): PROBNP in the last 8760 hours. HbA1C: No results for input(s): HGBA1C in the  last 72 hours. CBG: Recent Labs  Lab 09/21/20 1157 09/21/20 1603 09/21/20 2021 09/22/20 0838 09/22/20 1249  GLUCAP 97 99 108* 85 95   Lipid Profile: No results for input(s): CHOL, HDL, LDLCALC, TRIG, CHOLHDL, LDLDIRECT in the last 72 hours. Thyroid Function Tests: No results for input(s): TSH, T4TOTAL, FREET4, T3FREE, THYROIDAB in the last 72 hours. Anemia Panel: No results for input(s): VITAMINB12, FOLATE, FERRITIN, TIBC, IRON, RETICCTPCT in the last 72 hours. Sepsis Labs: No results for input(s): PROCALCITON, LATICACIDVEN in the last 168 hours.  Recent Results (from the past 240 hour(s))  MRSA PCR Screening     Status: None   Collection Time: 09/22/20 12:09 PM   Specimen: Nasopharyngeal  Result Value Ref Range Status   MRSA by PCR NEGATIVE NEGATIVE Final    Comment:        The GeneXpert MRSA Assay (FDA approved for NASAL specimens only), is one component of a comprehensive MRSA colonization surveillance program. It is not intended to diagnose MRSA infection nor to guide or monitor treatment for MRSA infections. Performed at [facility], [address], [place], [place] [address]       Imaging Studies   No results found.   Medications   Scheduled Meds: . enoxaparin (LOVENOX) injection  0.5 mg/kg Subcutaneous Q24H  . feeding supplement  1 Container Oral TID BM  . mouth rinse  15 mL Mouth Rinse q12n4p  . nystatin  5 mL Oral QID  . pantoprazole  40 mg Oral BID AC  . traZODone  100 mg Oral QHS   Continuous Infusions: . sodium chloride Stopped (09/07/20 0725)       LOS: 40 days    Time spent: 15 minutes    Ezekiel Slocumb, DO [facility]  09/22/2020, 3:59 PM    If 7PM-7AM, please contact night-coverage. How to contact the [facility] Attending or Consulting provider [place] or covering provider during after hours [place], for this patient?    1. Check the care team in [facility] and look for a) attending/consulting TRH provider listed and  b) the [facility] team listed 2. Log into [email or web address] and use [place]'s universal password to access. If you do not have the password, please contact the hospital operator. 3. Locate the [facility] provider you are looking for under [facility] and page to a number that you can be directly reached. 4. If you still have difficulty reaching the provider, please page the [facility] (Director on Call) for the Hospitalists listed on amion for assistance.

## 2020-09-23 NOTE — Progress Notes (Signed)
   09/23/20 1100  Assess: MEWS Score  Temp 97.6 F (36.4 C)  BP 122/84  Pulse Rate (!) 111  Resp 17  SpO2 100 %  O2 Device Tracheostomy Collar  Assess: MEWS Score  MEWS Temp 0  MEWS Systolic 0  MEWS Pulse 2  MEWS RR 0  MEWS LOC 0  MEWS Score 2  MEWS Score Color Yellow  Assess: if the MEWS score is Yellow or Red  Were vital signs taken at a resting state? Yes  Focused Assessment No change from prior assessment  Early Detection of Sepsis Score *See Row Information* Low  MEWS guidelines implemented *See Row Information* Yes  Take Vital Signs  Increase Vital Sign Frequency  Yellow: Q 2hr X 2 then Q 4hr X 2, if remains yellow, continue Q 4hrs  Escalate  MEWS: Escalate Yellow: discuss with charge nurse/RN and consider discussing with provider and RRT  Notify: Charge Nurse/RN  Name of Charge Nurse/RN Notified Caryl Pina  Date Charge Nurse/RN Notified 09/14/20  Time Charge Nurse/RN Notified 0915  Notify: Provider  Provider Name/Title Dr. Erlinda Hong  Date Provider Notified 09/14/20  Time Provider Notified 1115  Notification Type Page  Notification Reason Change in status  Response No new orders  Date of Provider Response 09/23/20  Time of Provider Response 1130  Document  Patient Outcome Stabilized after interventions  Progress note created (see row info) Yes

## 2020-09-23 NOTE — TOC Progression Note (Signed)
Transition of Care [facility]) - Progression Note    Patient Details  Name: Kathy Vega MRN: [id number] Date of Birth: Apr 01, 1976  Transition of Care [facility]) CM/SW Contact  Shelbie Hutching, RN Phone Number: 09/23/2020, 10:11 AM  Clinical Narrative:     RNCM spoke with patient at bedside this morning.  Patient still really wants to go to inpatient rehab.  CIR has not had a bed available.  TOC team will follow up with CIR and [facility] today.  Patient is not comfortable going home and does not want to go to SNF.    Expected Discharge Plan: IP Rehab Facility Barriers to Discharge: Insurance Authorization  Expected Discharge Plan and Services Expected Discharge Plan: [place]     Post Acute Care Choice: IP Rehab Living arrangements for the past 2 months: Single Family Home                                       Social Determinants of Health (SDOH) Interventions    Readmission Risk Interventions Readmission Risk Prevention Plan 09/10/2020  Transportation Screening Complete  PCP or Specialist Appt within 3-5 Days Complete  HRI or [place] Complete  Social Work Consult for [place] Planning/Counseling Complete  Palliative Care Screening Complete  Medication Review Press photographer) Complete  Some recent data might be hidden

## 2020-09-23 NOTE — Progress Notes (Signed)
   09/23/20 0859  Assess: MEWS Score  Temp 97.8 F (36.6 C)  BP (!) 112/91  Pulse Rate (!) 114  Resp 17  SpO2 100 %  O2 Device Tracheostomy Collar  Assess: MEWS Score  MEWS Temp 0  MEWS Systolic 0  MEWS Pulse 2  MEWS RR 0  MEWS LOC 0  MEWS Score 2  MEWS Score Color Yellow  Assess: if the MEWS score is Yellow or Red  Were vital signs taken at a resting state? Yes  Focused Assessment No change from prior assessment  Early Detection of Sepsis Score *See Row Information* Low  MEWS guidelines implemented *See Row Information* Yes  Take Vital Signs  Increase Vital Sign Frequency  Yellow: Q 2hr X 2 then Q 4hr X 2, if remains yellow, continue Q 4hrs  Escalate  MEWS: Escalate Yellow: discuss with charge nurse/RN and consider discussing with provider and RRT  Notify: Charge Nurse/RN  Name of Charge Nurse/RN Notified Caryl Pina  Date Charge Nurse/RN Notified 09/14/20  Time Charge Nurse/RN Notified 0915  Notify: Provider  Provider Name/Title Dr Erlinda Hong  Date Provider Notified 09/14/20  Time Provider Notified [phone]  Notification Type Page  Notification Reason Change in status  Response No new orders  Date of Provider Response 09/23/20  Time of Provider Response 0930  Document  Patient Outcome Stabilized after interventions  Progress note created (see row info) Yes

## 2020-09-23 NOTE — Progress Notes (Signed)
Attempted X2 for ABG each attempt had clotting in syringe, pt refused any further attempts at this time. Secure chat sent to Nicole Kindred, D.O. regarding this. Pt inner cannula changed. Water bottle changed. Pt did not want to be suctioned.

## 2020-09-23 NOTE — Progress Notes (Signed)
Patient was not in her room , RN reports patient is improving,  will check on patient tomorrow.

## 2020-09-23 NOTE — Progress Notes (Signed)
Physical Therapy Treatment Patient Details Name: Kathy Vega MRN: [id number] DOB: [birth year] Today's Date: 09/23/2020    History of Present Illness Pt is a 44 y.o. female presenting to hospital 9/16 with SOB and confusion; initial O2 sats in 20's; pt COVID (+) 6 days prior.  Mechanically intubated 9/17.  S/p trach 10/8.  Pt admitted with acute hypoxic respiratory failure secondary COVID-19 PNA and ARDS, possible OSA with upper airway swelling s/p trach, and severe acute hypoxic and hypercapnic respiratory failure.  PMH includes anemia, thyroid disease, h/o leg surgery.    PT Comments    Pt was sitting EOB upon arriving with RT in room. She is aware that CIR deemed her too high level. Lengthy discussion on recommendation of home with [facility] services. She was able to stand and ambulate without AD. On 3 L trach collar throughout session with sao2 >95% throughout. Will perform stair training next session to simulate home entry. Pt was in bed at conclusion of session with spouse and son present at bedside.      Follow Up Recommendations  Supervision/Assistance - 24 hour;Supervision for mobility/OOB;Home health PT (pt is too high level for CIR. recommend home with [facility] service)     Equipment Recommendations  3in1 (PT);Rolling walker with 5" wheels    Recommendations for Other Services       Precautions / Restrictions Precautions Precautions: Fall Precaution Comments: Trach (trach collar); HOB >30 degrees Restrictions Weight Bearing Restrictions: No    Mobility  Bed Mobility Overal bed mobility: Modified Independent                Transfers Overall transfer level: Modified independent Equipment used: None Transfers: Sit to/from Stand Sit to Stand: Modified independent (Device/Increase time)            Ambulation/Gait Ambulation/Gait assistance: Supervision Gait Distance (Feet): 300 Feet Assistive device: None   Gait velocity: decreased   General Gait Details: no LOB  or unsteadiness with ambulation today    Stairs Stairs:  (will perform stair training next session)              Balance Overall balance assessment: Needs assistance Sitting-balance support: No upper extremity supported;Feet supported Sitting balance-Leahy Scale: Normal Sitting balance - Comments: steady sitting reaching outside BOS   Standing balance support: No upper extremity supported;During functional activity Standing balance-Leahy Scale: Good Standing balance comment: no LOB in standing without UE support        Cognition Arousal/Alertness: Awake/alert Behavior During Therapy: WFL for tasks assessed/performed Overall Cognitive Status: Within Functional Limits for tasks assessed        General Comments: Pt communicated via white board             Pertinent Vitals/Pain Pain Assessment: No/denies pain (did have headache earlier) Pain Score: 0-No pain Faces Pain Scale: No hurt Pain Intervention(s): Limited activity within patient's tolerance;Monitored during session;Premedicated before session;Repositioned           PT Goals (current goals can now be found in the care plan section) Acute Rehab PT Goals Patient Stated Goal: go home Progress towards PT goals: Progressing toward goals    Frequency    7X/week      PT Plan Current plan remains appropriate       AM-PAC PT "6 Clicks" Mobility   Outcome Measure  Help needed turning from your back to your side while in a flat bed without using bedrails?: None Help needed moving from lying on your back to sitting on the  side of a flat bed without using bedrails?: None Help needed moving to and from a bed to a chair (including a wheelchair)?: None Help needed standing up from a chair using your arms (e.g., wheelchair or bedside chair)?: A Little Help needed to walk in hospital room?: A Little Help needed climbing 3-5 steps with a railing? : A Little 6 Click Score: 21    End of Session Equipment  Utilized During Treatment: Gait belt;Oxygen Activity Tolerance: Patient tolerated treatment well Patient left: with call bell/phone within reach;with bed alarm set Nurse Communication: Mobility status;Precautions PT Visit Diagnosis: Other abnormalities of gait and mobility (R26.89);Muscle weakness (generalized) (M62.81);Difficulty in walking, not elsewhere classified (R26.2)     Time: 1443-1510 PT Time Calculation (min) (ACUTE ONLY): 27 min  Charges:  $Gait Training: 23-37 mins                     Julaine Fusi PTA 09/23/20, 4:19 PM

## 2020-09-23 NOTE — Progress Notes (Signed)
PT Cancellation Note  Patient Details Name: Kathy Vega MRN: [id number] DOB: 26-Feb-1976   Cancelled Treatment:     PT attempt. Pt was sitting EOB with spouse and son in room. She reports having severe headache and elevated HR throughout the day. pt requesting therapist return at a later time. Will return later and continue to follow pt per POC.    [name] 09/23/2020, 1:50 PM

## 2020-09-23 NOTE — Progress Notes (Signed)
IP rehab admissions - I spoke with our rehab MD, Dr. Posey Pronto, this am.  Patient is doing too well to qualify for our acute inpatient rehab at [facility].  Recommend home with [facility] or outpatient therapy.  I will sign off for inpatient rehab at [facility] at this time.  [phone]

## 2020-09-23 NOTE — Plan of Care (Signed)

## 2020-09-24 DIAGNOSIS — J9601 Acute respiratory failure with hypoxia: Secondary | ICD-10-CM | POA: Diagnosis not present

## 2020-09-24 DIAGNOSIS — Z930 Tracheostomy status: Secondary | ICD-10-CM | POA: Diagnosis not present

## 2020-09-24 LAB — CBC
HCT: 30.6 % — ABNORMAL LOW (ref 36.0–46.0)
Hemoglobin: 9.7 g/dL — ABNORMAL LOW (ref 12.0–15.0)
MCH: 25.5 pg — ABNORMAL LOW (ref 26.0–34.0)
MCHC: 31.7 g/dL (ref 30.0–36.0)
MCV: 80.5 fL (ref 80.0–100.0)
Platelets: 291 10*3/uL (ref 150–400)
RBC: 3.8 MIL/uL — ABNORMAL LOW (ref 3.87–5.11)
RDW: 24.8 % — ABNORMAL HIGH (ref 11.5–15.5)
WBC: 4.5 10*3/uL (ref 4.0–10.5)
nRBC: 0 % (ref 0.0–0.2)

## 2020-09-24 LAB — BASIC METABOLIC PANEL
Anion gap: 11 (ref 5–15)
BUN: 12 mg/dL (ref 6–20)
CO2: 25 mmol/L (ref 22–32)
Calcium: 9.6 mg/dL (ref 8.9–10.3)
Chloride: 100 mmol/L (ref 98–111)
Creatinine, Ser: 0.69 mg/dL (ref 0.44–1.00)
GFR, Estimated: >60 mL/min (ref >60)
Glucose, Bld: 96 mg/dL (ref 70–99)
Potassium: 3.7 mmol/L (ref 3.5–5.1)
Sodium: 136 mmol/L (ref 135–145)

## 2020-09-24 NOTE — Plan of Care (Signed)
  Problem: Health Behavior/Discharge Planning: Goal: Ability to manage health-related needs will improve Outcome: Progressing   Problem: Clinical Measurements: Goal: Ability to maintain clinical measurements within normal limits will improve Outcome: Progressing Goal: Will remain free from infection Outcome: Progressing Goal: Diagnostic test results will improve Outcome: Progressing Goal: Respiratory complications will improve Outcome: Progressing Goal: Cardiovascular complication will be avoided Outcome: Progressing   Problem: Activity: Goal: Risk for activity intolerance will decrease Outcome: Progressing   Problem: Elimination: Goal: Will not experience complications related to bowel motility Outcome: Progressing Goal: Will not experience complications related to urinary retention Outcome: Progressing   Problem: Pain Managment: Goal: General experience of comfort will improve Outcome: Progressing   Problem: Pain Managment: Goal: General experience of comfort will improve Outcome: Progressing   Problem: Safety: Goal: Ability to remain free from injury will improve Outcome: Progressing   Problem: Skin Integrity: Goal: Risk for impaired skin integrity will decrease Outcome: Progressing

## 2020-09-24 NOTE — Progress Notes (Signed)
°   09/24/20 1227  Assess: MEWS Score  Temp 98.2 F (36.8 C)  BP 122/80  Pulse Rate (!) 114  Resp 18  SpO2 99 %  Assess: MEWS Score  MEWS Temp 0  MEWS Systolic 0  MEWS Pulse 2  MEWS RR 0  MEWS LOC 0  MEWS Score 2  MEWS Score Color Yellow  Assess: if the MEWS score is Yellow or Red  Were vital signs taken at a resting state? Yes  Focused Assessment No change from prior assessment  Early Detection of Sepsis Score *See Row Information* Low  MEWS guidelines implemented *See Row Information* Yes  Treat  MEWS Interventions Escalated (See documentation below)  Pain Scale 0-10  Pain Score 0  Faces Pain Scale 0  Pain Type Acute pain  Take Vital Signs  Increase Vital Sign Frequency  Yellow: Q 2hr X 2 then Q 4hr X 2, if remains yellow, continue Q 4hrs  Escalate  MEWS: Escalate Yellow: discuss with charge nurse/RN and consider discussing with provider and RRT  Notify: Charge Nurse/RN  Name of Charge Nurse/RN Notified Estill Bamberg  Date Charge Nurse/RN Notified 09/24/20  Time Charge Nurse/RN Notified 1751  Document  Patient Outcome Stabilized after interventions

## 2020-09-24 NOTE — Progress Notes (Signed)
Physical Therapy Treatment Patient Details Name: Kathy Vega MRN: [id number] DOB: [birth year] Today's Date: 09/24/2020    History of Present Illness Pt is a 44 y.o. female presenting to hospital 9/16 with SOB and confusion; initial O2 sats in 20's; pt COVID (+) 6 days prior.  Mechanically intubated 9/17.  S/p trach 10/8.  Pt admitted with acute hypoxic respiratory failure secondary COVID-19 PNA and ARDS, possible OSA with upper airway swelling s/p trach, and severe acute hypoxic and hypercapnic respiratory failure.  PMH includes anemia, thyroid disease, h/o leg surgery.    PT Comments    Pt was I'ly standing in room washing face upon arriving. She states via mouthing words that the bed hurts her bottom. She agrees to session and is cooperative and pleasant throughout. Pt was able to ambulate without AD on 3 L o2 trach collar with sao2 >92% and HR maintained < 125 bpm. She  Safely performed ascending/descending 4 stair 3 x without difficulty. Pt is progressing well. CIR deemed pt to high level. Recommend DC to home with [facility] services.   Follow Up Recommendations  Supervision/Assistance - 24 hour;Supervision for mobility/OOB;Home health PT     Equipment Recommendations  3in1 (PT);Rolling walker with 5" wheels    Recommendations for Other Services       Precautions / Restrictions Precautions Precautions: Fall Precaution Comments: Trach (trach collar); HOB >30 degrees Restrictions Weight Bearing Restrictions: No    Mobility  Bed Mobility Overal bed mobility: Modified Independent       Transfers Overall transfer level: Modified independent Equipment used: None Transfers: Sit to/from Stand Sit to Stand: Modified independent (Device/Increase time)     Ambulation/Gait Ambulation/Gait assistance: Supervision Gait Distance (Feet): 300 Feet Assistive device: None   Gait velocity: decreased   General Gait Details: no LOB or unsteadiness with ambulation today     Stairs Stairs: Yes Stairs assistance: Supervision Stair Management: No rails;Step to pattern Number of Stairs: 4 General stair comments: pt performed ascending/descedning 4 stair 3 x. 1 x with BUE support, One time with single UE support, and 1 x without UE support. did perform with step to pattern due to weakness on RLE versus LLE      Balance Overall balance assessment: Needs assistance Sitting-balance support: No upper extremity supported;Feet supported Sitting balance-Leahy Scale: Normal Sitting balance - Comments: steady sitting reaching outside BOS   Standing balance support: No upper extremity supported;During functional activity Standing balance-Leahy Scale: Good Standing balance comment: no LOB in standing without UE support         Cognition Arousal/Alertness: Awake/alert Behavior During Therapy: WFL for tasks assessed/performed Overall Cognitive Status: Within Functional Limits for tasks assessed            General Comments: Pt was A and O x 4             Pertinent Vitals/Pain Pain Assessment: No/denies pain Pain Score: 0-No pain Faces Pain Scale: No hurt Pain Intervention(s): Limited activity within patient's tolerance;Monitored during session           PT Goals (current goals can now be found in the care plan section) Acute Rehab PT Goals Patient Stated Goal: go home Progress towards PT goals: Progressing toward goals    Frequency    7X/week      PT Plan Current plan remains appropriate       AM-PAC PT "6 Clicks" Mobility   Outcome Measure  Help needed turning from your back to your side while in a flat bed  without using bedrails?: None Help needed moving from lying on your back to sitting on the side of a flat bed without using bedrails?: None Help needed moving to and from a bed to a chair (including a wheelchair)?: None Help needed standing up from a chair using your arms (e.g., wheelchair or bedside chair)?: A Little Help  needed to walk in hospital room?: A Little Help needed climbing 3-5 steps with a railing? : A Little 6 Click Score: 21    End of Session Equipment Utilized During Treatment: Gait belt;Oxygen Activity Tolerance: Patient tolerated treatment well Patient left: with call bell/phone within reach;with bed alarm set Nurse Communication: Mobility status;Precautions PT Visit Diagnosis: Other abnormalities of gait and mobility (R26.89);Muscle weakness (generalized) (M62.81);Difficulty in walking, not elsewhere classified (R26.2)     Time: 5997-7414 PT Time Calculation (min) (ACUTE ONLY): 34 min  Charges:  $Gait Training: 23-37 mins                     Julaine Fusi PTA 09/24/20, 2:57 PM

## 2020-09-24 NOTE — TOC Progression Note (Signed)
Transition of Care [facility]) - Progression Note    Patient Details  Name: Kathy Vega MRN: [id number] Date of Birth: [birth year]  [facility]) CM/SW Contact  Shelbie Hutching, RN Phone Number: 09/24/2020, 1:37 PM  Clinical Narrative:    CIR officially signed off on patient yesterday.  Patient is doing really well with therapy and will be able to discharge home with home health services and oxygen.  Plan for nursing and respiratory therapy to provide daily trach care teaching for patient so she will be more comfortable when going home with this new trach.  [employer] will provide oxygen and trach supplies, RNCM working on finding home health agency to accept.    Expected Discharge Plan: IP Rehab Facility Barriers to Discharge: Insurance Authorization  Expected Discharge Plan and Services Expected Discharge Plan: [place]     Post Acute Care Choice: IP Rehab Living arrangements for the past 2 months: Single Family Home                                       Social Determinants of Health (SDOH) Interventions    Readmission Risk Interventions Readmission Risk Prevention Plan 09/10/2020  Transportation Screening Complete  PCP or Specialist Appt within 3-5 Days Complete  HRI or [place] Complete  Social Work Consult for [place] Planning/Counseling Complete  Palliative Care Screening Complete  Medication Review [employer]) Complete  Some recent data might be hidden

## 2020-09-24 NOTE — Progress Notes (Signed)
Pt education done on the proper way to change the inner cannula on the trach. She was also shown how to properly clean the outside of the trach and apply the drain sponge. She stood in front of the mirror and changed the inner cannula. She had a little difficulty with completely locking the cannula to the trach.

## 2020-09-24 NOTE — Progress Notes (Signed)
   09/24/20 1422  Assess: MEWS Score  Temp 97.8 F (36.6 C)  BP 109/84  Pulse Rate (!) 120  Resp 18  SpO2 100 %  Assess: MEWS Score  MEWS Temp 0  MEWS Systolic 0  MEWS Pulse 2  MEWS RR 0  MEWS LOC 0  MEWS Score 2  MEWS Score Color Yellow  Assess: if the MEWS score is Yellow or Red  Were vital signs taken at a resting state? Yes  Focused Assessment No change from prior assessment  Early Detection of Sepsis Score *See Row Information* Low  MEWS guidelines implemented *See Row Information* Yes  Treat  MEWS Interventions Escalated (See documentation below)  Pain Scale 0-10  Pain Score 0  Faces Pain Scale 0  Pain Type Acute pain  Take Vital Signs  Increase Vital Sign Frequency  Yellow: Q 2hr X 2 then Q 4hr X 2, if remains yellow, continue Q 4hrs  Escalate  MEWS: Escalate Yellow: discuss with charge nurse/RN and consider discussing with provider and RRT  Notify: Charge Nurse/RN  Name of Charge Nurse/RN Notified Estill Bamberg  Date Charge Nurse/RN Notified 09/24/20  Time Charge Nurse/RN Notified 1430  Document  Patient Outcome Stabilized after interventions

## 2020-09-24 NOTE — Progress Notes (Signed)
PROGRESS NOTE    Kathy Vega  AOZ:[id number] DOB: 03-28-1976 DOA: 08/13/2020 PCP: [facility] [place]    Chief Complaint  Patient presents with  . Shortness of Breath    Brief Narrative:   43 yo unvaccinated female admitted on 08/13/20 with COVID-19 pneumonia & ARDS requiring intubation on 9/17 and mechanical ventilation.  While in ICU, attempts made to transfer to [facility] or [facility] for ECMO but no beds were available and it was not certain patient would be candidate.  Proning protocols were followed while in ICU.  Patient required maximal ventilation support with sedation and remained critically ill.  She was weaned to FiO2 60% on 10/1.  Tracheostomy was placed on 09/04/20.  Patient began tolerating trach collar O2 in short durations 10/12-13 and began OOB to chair.   Started working with PT on 10/15.  Treated with 7 days Cefepime due to tracheal aspirate culture growing Pseudomonas. Hospitalist service assumed care of patient on 10/18.   Currently on cuffed size 6.  Patient is still unable to pass enough air around trach yet to be able to use speech valvePer pulmonology and ENT, patient had very significant laryngeal edema, in addition to body habitus being a complicating factor with likely underlying sleep apnea and/or alveolar hypoventilation.   She is ambulating and CIR declined her, Likely progression to home health   Subjective:   She is ambulating, she denies pain, she communicates with writing on white board.   Assessment & Plan:   Active Problems:   COVID-19   Acute renal failure (ARF) (HCC)   Elevated liver enzymes   CAP (community acquired pneumonia)   Acute encephalopathy   Hypokalemia   Acute respiratory failure ([place])   Tracheostomy in place [facility])   Acute hypoxic respiratory failure secondary to COVID-19 pneumonia and ARDS Morbid obesity, possible OSA, possible alveolar hypoventilation (OHS) S/p course of remdesevir, azithromycin & ceftriaxone.  Off  airborne precautions. Tracheostomy placed 10/8.  Cefepime started 10/11 for suspected HCAP-tracheal aspriate +pseudomonas - course completed. Will need sleep study as outpatient   Tracheostomy status Pt had severe laryngeal edema, per PCCM and ENT.   No air passing around downsized trach due to persistent edema most likely.  Unable to use speech valve or cap trach yet.  follow up with ENT in 2 weeks (either Dr. Tami Ribas) Need to do trach care teaching, likely will need to go home with home health  Troponin elevation-due to demand ischemia Hypertrophic cardiomyopathy Was treated with lasix Echo revealed EF 45-50% with severe concentric LVH, diastolic dysfunction Currently denies chest pain, euvolemic on exam Close monitor volume status Recommend outpatient cardiology follow-up  Normocytic anemia, likely anemia of chronic disease -Appear chronic -Per chart review her hemoglobin range from 6.5 to 7.1 2017-2019 -she received total of 5 units PRBC transfusion this hospitalization -No overt sign of bleeding, I do not see a fecal occult blood test will order -Iron panel in September was unremarkable -We will check TSH, N62, folic acid, reticulocyte count Per chart review she is supposed to take thyroid supplement at home and iron supplement Will follow tsh result  Mild acute renal failure -resolved Hypokalemia -resolved Hypernatremia -resolved -All remained stable   Hyperglycemia likely related to tube feeds, she is now tolerating regular diet, no hyperglycemia  Body mass index is 35.97 kg/m.   DVT prophylaxis: SCDs Start: 08/13/20 2035   Code Status: full  Family Communication: declined my offer to update her family,  Disposition:   Status is:  Inpatient  Dispo: The patient is from: home              Anticipated d/c is to: likely home with home health next week               Consultants:   Critical care  ENT Dr Tami Ribas  CIR  Procedures:    Intubation/extubation  tracheostomy  prbc transfusion  Antimicrobials:   As above, currently off abx     Objective: Vitals:   09/24/20 0412 09/24/20 0807 09/24/20 1227 09/24/20 1422  BP: 106/66 111/67 122/80 109/84  Pulse: 75 (!) 110 (!) 114 (!) 120  Resp: 18 18 18 18   Temp: 98.7 F (37.1 C) 98.2 F (36.8 C) 98.2 F (36.8 C) 97.8 F (36.6 C)  TempSrc: Axillary Oral Oral Oral  SpO2: 100% 99% 99% 100%  Weight:      Height:        Intake/Output Summary (Last 24 hours) at 09/24/2020 1512 Last data filed at 09/24/2020 1410 Gross per 24 hour  Intake 240 ml  Output --  Net 240 ml   Filed Weights   09/18/20 0516 09/19/20 0500 09/20/20 0500  Weight: 109.3 kg 107.4 kg 110.9 kg    Examination:  General exam: calm, NAD, + trach Respiratory system: Coarse breath sound , positive rhonchi ,no wheezing ,respiratory effort normal. Cardiovascular system: S1 & S2 heard, RRR. No pedal edema. Gastrointestinal system: Abdomen is nondistended, soft and nontender. Normal bowel sounds heard. Central nervous system: Alert and oriented. No focal neurological deficits. Extremities: Symmetric 5 x 5 power. Skin: No rashes, lesions or ulcers Psychiatry: Judgement and insight appear normal. Mood & affect appropriate.     Data Reviewed: I have personally reviewed following labs and imaging studies  CBC: Recent Labs  Lab 09/19/20 0435 09/22/20 0412 09/24/20 0432  WBC 6.6 5.6 4.5  HGB 10.6* 10.4* 9.7*  HCT 33.6* 32.9* 30.6*  MCV 78.9* 79.9* 80.5  PLT 347 349 725    Basic Metabolic Panel: Recent Labs  Lab 09/19/20 0435 09/22/20 0412 09/24/20 0432  NA 138 137 136  K 4.0 3.8 3.7  CL 102 102 100  CO2 26 25 25   GLUCOSE 102* 89 96  BUN 10 12 12   CREATININE 0.86 0.80 0.69  CALCIUM 10.0 9.9 9.6    GFR: Estimated Creatinine Clearance: 118.2 mL/min (by C-G formula based on SCr of 0.69 mg/dL).  Liver Function Tests: No results for input(s): AST, ALT, ALKPHOS, BILITOT,  PROT, ALBUMIN in the last 168 hours.  CBG: Recent Labs  Lab 09/21/20 1603 09/21/20 2021 09/22/20 0838 09/22/20 1249 09/22/20 1706  GLUCAP 99 108* 85 95 101*     Recent Results (from the past 240 hour(s))  MRSA PCR Screening     Status: None   Collection Time: 09/22/20 12:09 PM   Specimen: Nasopharyngeal  Result Value Ref Range Status   MRSA by PCR NEGATIVE NEGATIVE Final    Comment:        The GeneXpert MRSA Assay (FDA approved for NASAL specimens only), is one component of a comprehensive MRSA colonization surveillance program. It is not intended to diagnose MRSA infection nor to guide or monitor treatment for MRSA infections. Performed at [facility], [address], [place], [place] [address]          Radiology Studies: No results found.      Scheduled Meds: . enoxaparin (LOVENOX) injection  0.5 mg/kg Subcutaneous Q24H  . feeding supplement  1 Container Oral TID  BM  . mouth rinse  15 mL Mouth Rinse q12n4p  . nystatin  5 mL Oral QID  . pantoprazole  40 mg Oral BID AC  . traZODone  100 mg Oral QHS   Continuous Infusions: . sodium chloride Stopped (09/07/20 0725)     LOS: 42 days   Time spent: 64mins Greater than 50% of this time was spent in counseling, explanation of diagnosis, planning of further management, and coordination of care.  I have personally reviewed and interpreted on  09/24/2020 daily labs, I reviewed all nursing notes, pharmacy notes, consultant notes,  vitals, pertinent old records  I have discussed plan of care as described above with RN , patient  on 09/24/2020  Voice Recognition /Dragon dictation system was used to create this note, attempts have been made to correct errors. Please contact the author with questions and/or clarifications.   Florencia Reasons, MD PhD FACP [facility]  Available via Epic secure chat 7am-7pm for nonurgent issues Please page for urgent issues To page the attending provider between  7A-7P or the covering provider during after hours 7P-7A, please log into the web site [email or web address] and access using universal [place] password for that web site. If you do not have the password, please call the hospital operator.    09/24/2020, 3:12 PM

## 2020-09-25 DIAGNOSIS — J9601 Acute respiratory failure with hypoxia: Secondary | ICD-10-CM | POA: Diagnosis not present

## 2020-09-25 DIAGNOSIS — Z930 Tracheostomy status: Secondary | ICD-10-CM | POA: Diagnosis not present

## 2020-09-25 NOTE — Progress Notes (Signed)

## 2020-09-25 NOTE — Progress Notes (Signed)
Physical Therapy Treatment Patient Details Name: Kathy Vega MRN: [id number] DOB: 1976-11-27 Today's Date: 09/25/2020    History of Present Illness Pt is a 44 y.o. female presenting to hospital 9/16 with SOB and confusion; initial O2 sats in 20's; pt COVID (+) 6 days prior.  Mechanically intubated 9/17.  S/p trach 10/8.  Pt admitted with acute hypoxic respiratory failure secondary COVID-19 PNA and ARDS, possible OSA with upper airway swelling s/p trach, and severe acute hypoxic and hypercapnic respiratory failure.  PMH includes anemia, thyroid disease, h/o leg surgery.    PT Comments    Pt was sitting EOB upon arriving. She agrees to PT session and is cooperative and pleasant throughout. pt's spouse was in ED overnight. Pt concerned about him throughout session. She was able to ambulate increased distances today on 3 L o2 trach collar without desaturation. Does continue to have elevated HR to 140s but with seated rest, quickly resolves to low 100s. She is planning to DC home on Tuesday. Will benefit from [facility] services to continue to progress to PLOF.      Follow Up Recommendations  Supervision/Assistance - 24 hour;Supervision for mobility/OOB;Home health PT     Equipment Recommendations  None recommended by PT    Recommendations for Other Services       Precautions / Restrictions Precautions Precautions: Fall Precaution Comments: Trach (trach collar); HOB >30 degrees Restrictions Weight Bearing Restrictions: No    Mobility  Bed Mobility Overal bed mobility: Modified Independent     Transfers Overall transfer level: Modified independent     Ambulation/Gait Ambulation/Gait assistance: Supervision Gait Distance (Feet): 400 Feet Assistive device: None       General Gait Details: 3L o2 trach collar FIO2       Balance Overall balance assessment: Needs assistance Sitting-balance support: No upper extremity supported;Feet supported Sitting balance-Leahy Scale:  Normal Sitting balance - Comments: steady sitting reaching outside BOS   Standing balance support: No upper extremity supported;During functional activity Standing balance-Leahy Scale: Good Standing balance comment: no LOB in standing without UE support           Cognition Arousal/Alertness: Awake/alert Behavior During Therapy: WFL for tasks assessed/performed Overall Cognitive Status: Within Functional Limits for tasks assessed            General Comments: Pt was A and O x 4             Pertinent Vitals/Pain Pain Assessment: No/denies pain           PT Goals (current goals can now be found in the care plan section) Acute Rehab PT Goals Patient Stated Goal: go home Progress towards PT goals: Progressing toward goals    Frequency    7X/week      PT Plan Current plan remains appropriate       AM-PAC PT "6 Clicks" Mobility   Outcome Measure  Help needed turning from your back to your side while in a flat bed without using bedrails?: None Help needed moving from lying on your back to sitting on the side of a flat bed without using bedrails?: None Help needed moving to and from a bed to a chair (including a wheelchair)?: None Help needed standing up from a chair using your arms (e.g., wheelchair or bedside chair)?: A Little Help needed to walk in hospital room?: A Little Help needed climbing 3-5 steps with a railing? : A Little 6 Click Score: 21    End of Session Equipment Utilized During Treatment: Gait  belt;Oxygen Activity Tolerance: Patient tolerated treatment well Patient left: with call bell/phone within reach;with bed alarm set Nurse Communication: Mobility status;Precautions PT Visit Diagnosis: Other abnormalities of gait and mobility (R26.89);Muscle weakness (generalized) (M62.81);Difficulty in walking, not elsewhere classified (R26.2)     Time: 4758-3074 PT Time Calculation (min) (ACUTE ONLY): 50 min  Charges:  $Gait Training: 23-37  mins $Therapeutic Activity: 8-22 mins                     Julaine Fusi PTA 09/25/20, 4:01 PM

## 2020-09-25 NOTE — Progress Notes (Signed)
PROGRESS NOTE    Kathy Vega  WFU:[id number] DOB: 1976-09-09 DOA: 08/13/2020 PCP: [facility] [place]    Chief Complaint  Patient presents with  . Shortness of Breath    Brief Narrative:   44 yo unvaccinated female admitted on 08/13/20 with COVID-19 pneumonia & ARDS requiring intubation on 9/17 and mechanical ventilation.  While in ICU, attempts made to transfer to [facility] or [facility] for ECMO but no beds were available and it was not certain patient would be candidate.  Proning protocols were followed while in ICU.  Patient required maximal ventilation support with sedation and remained critically ill.  She was weaned to FiO2 60% on 10/1.  Tracheostomy was placed on 09/04/20.  Patient began tolerating trach collar O2 in short durations 10/12-13 and began OOB to chair.   Started working with PT on 10/15.  Treated with 7 days Cefepime due to tracheal aspirate culture growing Pseudomonas. Hospitalist service assumed care of patient on 10/18.   Currently on cuffed size 6.  Patient is still unable to pass enough air around trach yet to be able to use speech valvePer pulmonology and ENT, patient had very significant laryngeal edema, in addition to body habitus being a complicating factor with likely underlying sleep apnea and/or alveolar hypoventilation.   She is ambulating and CIR declined her, Likely progression to home health   Subjective:   She is ambulating, she denies pain, she tolerated PMV trial for one min,  She reports did not have to suction a lot  Assessment & Plan:   Active Problems:   COVID-19   Acute renal failure (ARF) (HCC)   Elevated liver enzymes   CAP (community acquired pneumonia)   Acute encephalopathy   Hypokalemia   Acute respiratory failure ([place])   Tracheostomy in place [facility])   Acute hypoxic respiratory failure secondary to COVID-19 pneumonia and ARDS Morbid obesity, possible OSA, possible alveolar hypoventilation (OHS) S/p course of remdesevir,  azithromycin & ceftriaxone.  Off airborne precautions. Tracheostomy placed 10/8.  Cefepime started 10/11 for suspected HCAP-tracheal aspriate +pseudomonas - course completed. Will need sleep study as outpatient   Tracheostomy status Pt had severe laryngeal edema, per PCCM and ENT.   No air passing around downsized trach due to persistent edema most likely.  Unable to use speech valve or cap trach yet.  follow up with ENT in 2 weeks (either Dr. Tami Ribas) Per speech today consider "pt being able to move to a Shorter trach vs the XL length to see if this would make a difference for PMV tolerance" will contact pulm Need to do trach care teaching, likely will need to go home with home health  Troponin elevation-due to demand ischemia Hypertrophic cardiomyopathy Was treated with lasix Echo revealed EF 45-50% with severe concentric LVH, diastolic dysfunction Currently denies chest pain, euvolemic on exam Close monitor volume status Recommend outpatient cardiology follow-up  Normocytic anemia, likely anemia of chronic disease -Appear chronic -Per chart review her hemoglobin range from 6.5 to 7.1 2017-2019 -she received total of 5 units PRBC transfusion this hospitalization -No overt sign of bleeding, I do not see a fecal occult blood test will order -Iron panel in September was unremarkable -TSH, K02, folic acid, reticulocyte count and FOBT ordered on October 28, lab order was discontinued without notifying me, FOBT pending collection  Per chart review she is supposed to take thyroid supplement at home and iron supplement Will follow tsh result  Mild acute renal failure -resolved Hypokalemia -resolved Hypernatremia -resolved -All  remained stable   Hyperglycemia likely related to tube feeds, she is now tolerating regular diet, no hyperglycemia  Body mass index is 36.48 kg/m.   DVT prophylaxis: SCDs Start: 08/13/20 2035   Code Status: full  Family Communication: declined my offer  to update her family,  Disposition:   Status is: Inpatient  Dispo: The patient is from: home              Anticipated d/c is to: likely home with home health next week               Consultants:   Critical care  ENT Dr Tami Ribas  CIR  Procedures:   Intubation/extubation  tracheostomy  prbc transfusion  Antimicrobials:   As above, currently off abx     Objective: Vitals:   09/25/20 0517 09/25/20 0742 09/25/20 1225 09/25/20 1619  BP: 106/77 123/80  109/83  Pulse: (!) 104 90  (!) 105  Resp: 20     Temp: (!) 97.3 F (36.3 C) 98 F (36.7 C) 98.4 F (36.9 C) 97.8 F (36.6 C)  TempSrc: Oral Oral Oral Oral  SpO2: 100%  100% 100%  Weight:      Height:        Intake/Output Summary (Last 24 hours) at 09/25/2020 1740 Last data filed at 09/24/2020 2145 Gross per 24 hour  Intake 360 ml  Output --  Net 360 ml   Filed Weights   09/19/20 0500 09/20/20 0500 09/25/20 0500  Weight: 107.4 kg 110.9 kg 112.5 kg    Examination:  General exam: calm, NAD, + trach Respiratory system: Coarse breath sound , positive rhonchi ,no wheezing ,respiratory effort normal. Cardiovascular system: S1 & S2 heard, RRR. No pedal edema. Gastrointestinal system: Abdomen is nondistended, soft and nontender. Normal bowel sounds heard. Central nervous system: Alert and oriented. No focal neurological deficits. Extremities: Symmetric 5 x 5 power. Skin: No rashes, lesions or ulcers Psychiatry: Judgement and insight appear normal. Mood & affect appropriate.     Data Reviewed: I have personally reviewed following labs and imaging studies  CBC: Recent Labs  Lab 09/19/20 0435 09/22/20 0412 09/24/20 0432  WBC 6.6 5.6 4.5  HGB 10.6* 10.4* 9.7*  HCT 33.6* 32.9* 30.6*  MCV 78.9* 79.9* 80.5  PLT 347 349 093    Basic Metabolic Panel: Recent Labs  Lab 09/19/20 0435 09/22/20 0412 09/24/20 0432  NA 138 137 136  K 4.0 3.8 3.7  CL 102 102 100  CO2 26 25 25   GLUCOSE 102* 89 96  BUN 10  12 12   CREATININE 0.86 0.80 0.69  CALCIUM 10.0 9.9 9.6    GFR: Estimated Creatinine Clearance: 119 mL/min (by C-G formula based on SCr of 0.69 mg/dL).  Liver Function Tests: No results for input(s): AST, ALT, ALKPHOS, BILITOT, PROT, ALBUMIN in the last 168 hours.  CBG: Recent Labs  Lab 09/21/20 1603 09/21/20 2021 09/22/20 0838 09/22/20 1249 09/22/20 1706  GLUCAP 99 108* 85 95 101*     Recent Results (from the past 240 hour(s))  MRSA PCR Screening     Status: None   Collection Time: 09/22/20 12:09 PM   Specimen: Nasopharyngeal  Result Value Ref Range Status   MRSA by PCR NEGATIVE NEGATIVE Final    Comment:        The GeneXpert MRSA Assay (FDA approved for NASAL specimens only), is one component of a comprehensive MRSA colonization surveillance program. It is not intended to diagnose MRSA infection nor to guide or monitor treatment  for MRSA infections. Performed at [facility], [address] [place], [place] [address]          Radiology Studies: No results found.      Scheduled Meds: . enoxaparin (LOVENOX) injection  0.5 mg/kg Subcutaneous Q24H  . feeding supplement  1 Container Oral TID BM  . mouth rinse  15 mL Mouth Rinse q12n4p  . nystatin  5 mL Oral QID  . pantoprazole  40 mg Oral BID AC  . traZODone  100 mg Oral QHS   Continuous Infusions: . sodium chloride Stopped (09/07/20 0725)     LOS: 43 days   Time spent: 31mins Greater than 50% of this time was spent in counseling, explanation of diagnosis, planning of further management, and coordination of care.  I have personally reviewed and interpreted on  09/25/2020 daily labs, I reviewed all nursing notes, pharmacy notes, consultant notes,  vitals, pertinent old records  I have discussed plan of care as described above with RN , patient  on 09/25/2020  Voice Recognition /Dragon dictation system was used to create this note, attempts have been made to correct errors. Please  contact the author with questions and/or clarifications.   Florencia Reasons, MD PhD FACP [facility]  Available via Epic secure chat 7am-7pm for nonurgent issues Please page for urgent issues To page the attending provider between 7A-7P or the covering provider during after hours 7P-7A, please log into the web site [email or web address] and access using universal [place] password for that web site. If you do not have the password, please call the hospital operator.    09/25/2020, 5:40 PM

## 2020-09-25 NOTE — Progress Notes (Signed)
Speech Language Pathology Treatment: Nada Boozer Speaking valve  Patient Details Name: Kathy Vega MRN: [id number] DOB: 11/25/1976 Today's Date: 09/25/2020 Time: 1130-1210 SLP Time Calculation (min) (ACUTE ONLY): 40 min  Assessment / Plan / Recommendation Clinical Impression  Pt seen today forongoingPMVassessment; A/O x4 and using writing and/or mouthing and gestures for communication w/ others. Pt is tolerating her Regular consistency diet w/ Thin liquids -- No decline in pulmonary status or overt, clinical s/s of aspiration noted by NSG. Recommendcontinue current diet w/ gravies added to moisten foods, thin liquids w/ general aspiration precautions; Pills in Pureefor ease of swallowing/clearing;positioning upright for eating/drinking meals.  Pt's Tracheostomy was placed on 09/04/2020.Reviewed/discussed w/ pt the PMV, its use for communication, and its care again. Discussed the change in air flow w/ a tracheostomy and purpose of the PMV for verbal communication as well as its role in swallowing. Pt has a Shiley #6XL,cuffed; cuff DEFLATED, pt on trach collar at Baseline. Finger occlusion was attempted initiallyand coughing encouraged to clear min+ mucous/phlegm(tracheal). Discussed the importance of coughing own secretions to clear and suction w/ yaunkeur vs being frequently suctioned by NSG(tracheal suctioning) d/t the irritation and increase in mucous. Slight/minredirected, superior airflow noted. Pt stated she noted increased ability to "push" air "through" and "talk a little" w/out the PMV -- educated her on the importance of NOT straining to talk in that manner but to wait until she is able to use finger occlusion and/or PMV. Upon placing the PMV, pt again was only able to tolerate ~1 minw/out some discomfort and air stacking noted. Upon removing the PMV, a min rush of airflow was noted at the level of the trach though less than last week. This was attempted x2 w/ same results. While  wearing the PMV for the ~1 min, pt was able to attain the strained vocal quality c/b a gravely/whispery quality; there was apparent strain. Pt stated she seemed to be able to just wear the PMV and breath "better" during a ~1 min trial w/out talking; when it became too uncomfortable, the PMV was removed, cleaned. Pt endorsed discomfort in breathing; unable to exhale sufficiently. RR remained ather baseline during/post removal of PMV w/ no increased in effort/WOB.  Discussion was had w/ pt and MD present in the room briefly re: inability to tolerate wearing of the PMV at this time and potential issues such as Edema(as per MD consultation) preventing Superior Airflow for voicing and comfortable PMV wear;pt/MDagreed. Pt is able to redirect slight/min airflow today which is an improvement fromprevious days though not sufficient to wear the PMV appropriately, or comfortably. Pt'simproved medical status overallbodeswell for improvement for toleration of PMV placement/wear. Thrush tx completed. Discussed frequent oral care practice. ST services will continue to follow for monitoring of appropriateness for PMV use/wear BUT STRONGLY RECOMMEND ENT F/U when possible for direct viewing and assessment of trachea d/t poor tolerance of PMV wear/use.  Discussed w/ MD the option of pt being able to move to a Shorter trach vs the XL length to see if this would make a difference for PMV tolerance. MD will f/u w/ ENT/Pulmonary. NSG updated. Pt agreed.    HPI HPI: (08/13/2020) Pt is a 44 yo female with a PMH of Hypothyroidism, morbid Obesity, and Anemia.  She presented to [facility] ER on 09/16 with c/o shortness of breath, headache, and confusion.  Upon arrival to the ER pts O2 sats were in the 20's she was placed on 5L of O2 via nasal canula, however her O2 sats  only increased to 29%.  She was diagnosed with COVID-19 6 days prior to ER presentation, and she is unvaccinated.  She was placed on NRB and heated HFNC with increase in  O2 sats to 93-95%.  Pt also noted to be confused and uncooperative likely secondary to taking a significant amount of cough syrup with codeine at home.  CT Head negative.  CXR revealed bilateral infiltrates.  PCCM team contacted for ICU admission.  She was orally intubated 08/14/2020 w/ OGT; traceheostomy 09/04/2020 w/ Dobhoff present for TFs support.  Pt has weaned to [facility] trials during the day w/ vent support at night as needed.       SLP Plan  Continue with current plan of care but will require f/u w/ ENT for direct viewing of trachea b/f progressing further w/ PMV wear/use       Recommendations   f/u w/ ENT for assessment and direct viewing of trachea for any reason of inability to attain Superior airflow around the trach itself when using the PMV -- pt unable to tolerate wearing the PMV for longer than ~1 min or attain phonation.      Patient may use Passy-Muir Speech Valve: with SLP only PMSV Supervision: Full MD: Please consider changing trach tube to :  (may consider a shorter trach vs XL if able)         General recommendations:  (f/u w/ ENT for direct viewing of trachea) Oral Care Recommendations: Oral care BID;Patient independent with oral care Follow up Recommendations:  (TBD) SLP Visit Diagnosis: Aphonia (R49.1) (secondary to tracheostomy) Plan: Continue with current plan of care (f/u w/ ENT for direct viewing of trachea)       GO                  Orinda Kenner, MS, CCC-SLP Speech Language Pathologist Rehab Services [phone] [facility] 09/25/2020, 1:48 PM

## 2020-09-25 NOTE — Plan of Care (Signed)
  Problem: Health Behavior/Discharge Planning: Goal: Ability to manage health-related needs will improve Outcome: Progressing   

## 2020-09-25 NOTE — TOC Progression Note (Signed)
Transition of Care [facility]) - Progression Note    Patient Details  Name: Kathy Vega MRN: [id number] Date of Birth: 1976-04-12  Transition of [facility]) CM/SW Contact  Shelbie Hutching, RN Phone Number: 09/25/2020, 3:36 PM  Clinical Narrative:    Respiratory therapy and bedside nursing have been doing trach care and teaching with the patient.  The plan will be for patient to go home with home health services, RNCM has been unable to get a home health agency to accept for services.  [facility] has taken the referral and will see if they will be able to staff for patient at home.  Patient will need home health nursing, PT, OT, aide and SW.  RNCM has spoken with [facility], a case manager with [facility] should be reaching out to the patient's mother, [name], within 24 business hours- so it may not be until Monday.  Patient is unable to speak on the phone since she has not been tolerating [employer] valve for speech.  Zach with [employer] has been working on trach supplies, oxygen, 3 in 1, and RW.  3 in 1 and RW have already been delivered to the patient's room.  [employer] is also trying to see if patient will qualify for a non invasive ventilator.   Tentative discharge plan for Tuesday as long as all equipment and home health is arranged.    Expected Discharge Plan: [place] Barriers to Discharge: No [place] will accept this patient  Expected Discharge Plan and Services Expected Discharge Plan: [place]   Discharge Planning Services: CM Consult Post Acute Care Choice: [place] arrangements for the past 2 months: Single Family Home                 DME Arranged: 3-N-1, Walker rolling, Trach supplies, Oxygen DME Agency: [employer] Date DME Agency Contacted: 09/25/20 Time DME Agency Contacted: [phone] Representative spoke with at DME Agency: [place] ([place]) Interventions     Readmission Risk Interventions Readmission Risk Prevention Plan 09/10/2020  Transportation Screening Complete  PCP or Specialist Appt within 3-5 Days Complete  HRI or [place] Complete  Social Work Consult for [place] Planning/Counseling Complete  Palliative Care Screening Complete  Medication Review Press photographer) Complete  Some recent data might be hidden

## 2020-09-26 DIAGNOSIS — Z930 Tracheostomy status: Secondary | ICD-10-CM | POA: Diagnosis not present

## 2020-09-26 DIAGNOSIS — J9601 Acute respiratory failure with hypoxia: Secondary | ICD-10-CM | POA: Diagnosis not present

## 2020-09-26 NOTE — Progress Notes (Addendum)
PROGRESS NOTE    Kathy Vega  QQV:[id number] DOB: 1976/06/18 DOA: 08/13/2020 PCP: [facility] [place]    Chief Complaint  Patient presents with  . Shortness of Breath    Brief Narrative:   44 yo unvaccinated female admitted on 08/13/20 with COVID-19 pneumonia & ARDS requiring intubation on 9/17 and mechanical ventilation.  While in ICU, attempts made to transfer to [facility] or [facility] for ECMO but no beds were available and it was not certain patient would be candidate.  Proning protocols were followed while in ICU.  Patient required maximal ventilation support with sedation and remained critically ill.  She was weaned to FiO2 60% on 10/1.  Tracheostomy was placed on 09/04/20.  Patient began tolerating trach collar O2 in short durations 10/12-13 and began OOB to chair.   Started working with PT on 10/15.  Treated with 7 days Cefepime due to tracheal aspirate culture growing Pseudomonas. Hospitalist service assumed care of patient on 10/18.   Currently on cuffed size 6.  Patient is still unable to pass enough air around trach yet to be able to use speech valvePer pulmonology and ENT, patient had very significant laryngeal edema, in addition to body habitus being a complicating factor with likely underlying sleep apnea and/or alveolar hypoventilation.   She is ambulating and CIR declined her, Likely progression to home health   Subjective:   She is sitting up in chair, she denies pain,  She does not want night time trazodone   Assessment & Plan:   Active Problems:   COVID-19   Acute renal failure (ARF) (HCC)   Elevated liver enzymes   CAP (community acquired pneumonia)   Acute encephalopathy   Hypokalemia   Acute respiratory failure ([place])   Tracheostomy in place [facility])   Acute hypoxic respiratory failure secondary to COVID-19 pneumonia and ARDS Morbid obesity, possible OSA, possible alveolar hypoventilation (OHS) S/p course of remdesevir, azithromycin & ceftriaxone.    Off airborne precautions. Tracheostomy placed 10/8.  Cefepime started 10/11 for suspected HCAP-tracheal aspriate +pseudomonas - course completed. Will need sleep study as outpatient   Tracheostomy status Pt had severe laryngeal edema, per PCCM and ENT.   No air passing around downsized trach due to persistent edema most likely.  Unable to use speech valve or cap trach yet.  follow up with ENT in 2 weeks (Dr. Tami Ribas) Per speech today consider "pt being able to move to a Shorter trach vs the XL length to see if this would make a difference for PMV tolerance" will contact pulm Need to do trach care teaching, likely will need to go home with home health  Troponin elevation-due to demand ischemia Hypertrophic cardiomyopathy Was treated with lasix Echo revealed EF 45-50% with severe concentric LVH, diastolic dysfunction Currently denies chest pain, euvolemic on exam Close monitor volume status Recommend outpatient cardiology follow-up  Normocytic anemia, likely anemia of chronic disease -Appear chronic -Per chart review her hemoglobin range from 6.5 to 7.1 2017-2019 -she received total of 5 units PRBC transfusion this hospitalization -No overt sign of bleeding, she reports stool is brown, urine is clear, she does reports h/o heavy menses  -Iron panel in September was unremarkable -she declined further lab test including TSH, P32, folic acid, reticulocyte count , she prefers to have it done by her pcp -FOBT pending collection  Per chart review she is supposed to take thyroid supplement at home and iron supplement She does not want it done   Mild acute renal failure -resolved  Hypokalemia -resolved Hypernatremia -resolved -All remained stable   Hyperglycemia likely related to tube feeds, she is now tolerating regular diet, no hyperglycemia  Body mass index is 36.61 kg/m.   DVT prophylaxis: SCDs Start: 08/13/20 2035   Code Status: full  Family Communication: declined my offer  to update her family again Disposition:   Status is: Inpatient  Dispo: The patient is from: home              Anticipated d/c is to: likely home with home health next week               Consultants:   Critical care  ENT Dr Tami Ribas  CIR  Procedures:   Intubation/extubation  tracheostomy  prbc transfusion  Antimicrobials:   As above, currently off abx     Objective: Vitals:   09/25/20 1947 09/26/20 0500 09/26/20 0530 09/26/20 0744  BP:   114/71 108/85  Pulse:   78 (!) 104  Resp:   16 16  Temp:   98 F (36.7 C)   TempSrc:   Oral   SpO2: 100%  100% 94%  Weight:  112.9 kg    Height:       No intake or output data in the 24 hours ending 09/26/20 0842 Filed Weights   09/20/20 0500 09/25/20 0500 09/26/20 0500  Weight: 110.9 kg 112.5 kg 112.9 kg    Examination:  General exam: calm, NAD, + trach Respiratory system: Coarse breath sound , positive rhonchi ,no wheezing ,respiratory effort normal. Cardiovascular system: S1 & S2 heard, RRR. No pedal edema. Gastrointestinal system: Abdomen is nondistended, soft and nontender. Normal bowel sounds heard. Central nervous system: Alert and oriented. No focal neurological deficits. Extremities: Symmetric 5 x 5 power. Skin: No rashes, lesions or ulcers Psychiatry: Judgement and insight appear normal. Mood & affect appropriate.     Data Reviewed: I have personally reviewed following labs and imaging studies  CBC: Recent Labs  Lab 09/22/20 0412 09/24/20 0432  WBC 5.6 4.5  HGB 10.4* 9.7*  HCT 32.9* 30.6*  MCV 79.9* 80.5  PLT 349 010    Basic Metabolic Panel: Recent Labs  Lab 09/22/20 0412 09/24/20 0432  NA 137 136  K 3.8 3.7  CL 102 100  CO2 25 25  GLUCOSE 89 96  BUN 12 12  CREATININE 0.80 0.69  CALCIUM 9.9 9.6    GFR: Estimated Creatinine Clearance: 119.3 mL/min (by C-G formula based on SCr of 0.69 mg/dL).  Liver Function Tests: No results for input(s): AST, ALT, ALKPHOS, BILITOT, PROT, ALBUMIN  in the last 168 hours.  CBG: Recent Labs  Lab 09/21/20 1603 09/21/20 2021 09/22/20 0838 09/22/20 1249 09/22/20 1706  GLUCAP 99 108* 85 95 101*     Recent Results (from the past 240 hour(s))  MRSA PCR Screening     Status: None   Collection Time: 09/22/20 12:09 PM   Specimen: Nasopharyngeal  Result Value Ref Range Status   MRSA by PCR NEGATIVE NEGATIVE Final    Comment:        The GeneXpert MRSA Assay (FDA approved for NASAL specimens only), is one component of a comprehensive MRSA colonization surveillance program. It is not intended to diagnose MRSA infection nor to guide or monitor treatment for MRSA infections. Performed at [facility], [address] [place], [place] [address]          Radiology Studies: No results found.      Scheduled Meds: . enoxaparin (LOVENOX) injection  0.5  mg/kg Subcutaneous Q24H  . feeding supplement  1 Container Oral TID BM  . mouth rinse  15 mL Mouth Rinse q12n4p  . nystatin  5 mL Oral QID  . pantoprazole  40 mg Oral BID AC  . traZODone  100 mg Oral QHS   Continuous Infusions: . sodium chloride Stopped (09/07/20 0725)     LOS: 44 days   Time spent: 56mins Greater than 50% of this time was spent in counseling, explanation of diagnosis, planning of further management, and coordination of care.  I have personally reviewed and interpreted on  09/26/2020 daily labs, I reviewed all nursing notes, pharmacy notes, consultant notes,  vitals, pertinent old records  I have discussed plan of care as described above with RN , patient  on 09/26/2020  Voice Recognition /Dragon dictation system was used to create this note, attempts have been made to correct errors. Please contact the author with questions and/or clarifications.   Florencia Reasons, MD PhD FACP [facility]  Available via Epic secure chat 7am-7pm for nonurgent issues Please page for urgent issues To page the attending provider between 7A-7P or the  covering provider during after hours 7P-7A, please log into the web site [email or web address] and access using universal [place] password for that web site. If you do not have the password, please call the hospital operator.    09/26/2020, 8:42 AM

## 2020-09-26 NOTE — Progress Notes (Signed)
Patient refused all scheduled night medication.

## 2020-09-26 NOTE — Progress Notes (Signed)
PT Cancellation Note  Patient Details Name: DANAISHA CELLI MRN: [id number] DOB: 04/26/1976   Cancelled Treatment:     PT attempt 2nd attempt today. First attempt, pt sitting on BSC having BM. Upon entering for 2nd attempt, pt asleep. Easily awakes but requested holding therapy at this time. PT will continue to follow as per POC.    [name] 09/26/2020, 12:59 PM

## 2020-09-27 DIAGNOSIS — J9601 Acute respiratory failure with hypoxia: Secondary | ICD-10-CM | POA: Diagnosis not present

## 2020-09-27 DIAGNOSIS — Z930 Tracheostomy status: Secondary | ICD-10-CM | POA: Diagnosis not present

## 2020-09-27 NOTE — Progress Notes (Signed)
Physical Therapy Treatment Patient Details Name: CYNTHIS PURINGTON MRN: [id number] DOB: Mar 31, 1976 Today's Date: 09/27/2020    History of Present Illness Pt is a 44 y.o. female presenting to hospital 9/16 with SOB and confusion; initial O2 sats in 20's; pt COVID (+) 6 days prior.  Mechanically intubated 9/17.  S/p trach 10/8.  Pt admitted with acute hypoxic respiratory failure secondary COVID-19 PNA and ARDS, possible OSA with upper airway swelling s/p trach, and severe acute hypoxic and hypercapnic respiratory failure.  PMH includes anemia, thyroid disease, h/o leg surgery.    PT Comments    Pt ready for session.  Stood and was able to complete 3 laps around unit with no AD and generally steady gait.  3 LPM through trach.  Vitals good after session with minimal HR increase.  Follow Up Recommendations  Supervision/Assistance - 24 hour;Supervision for mobility/OOB;Home health PT     Equipment Recommendations  None recommended by PT    Recommendations for Other Services       Precautions / Restrictions Precautions Precautions: Fall Precaution Comments: Trach (trach collar); HOB >30 degrees Restrictions Weight Bearing Restrictions: No    Mobility  Bed Mobility Overal bed mobility: Modified Independent                Transfers Overall transfer level: Modified independent                  Ambulation/Gait Ambulation/Gait assistance: Supervision Gait Distance (Feet): 600 Feet Assistive device: None Gait Pattern/deviations: Step-through pattern;Decreased step length - right;Decreased step length - left;Shuffle Gait velocity: decreased   General Gait Details: 3L o2 trach collar FIO2   Stairs             Wheelchair Mobility    Modified Rankin (Stroke Patients Only)       Balance Overall balance assessment: Needs assistance Sitting-balance support: No upper extremity supported;Feet supported Sitting balance-Leahy Scale: Normal     Standing balance  support: No upper extremity supported Standing balance-Leahy Scale: Good Standing balance comment: no LOB in standing without UE support                            Cognition Arousal/Alertness: Awake/alert Behavior During Therapy: WFL for tasks assessed/performed Overall Cognitive Status: Within Functional Limits for tasks assessed                                        Exercises      General Comments        Pertinent Vitals/Pain Pain Assessment: No/denies pain    Home Living                      Prior Function            PT Goals (current goals can now be found in the care plan section) Progress towards PT goals: Progressing toward goals    Frequency    7X/week      PT Plan Current plan remains appropriate    Co-evaluation              AM-PAC PT "6 Clicks" Mobility   Outcome Measure  Help needed turning from your back to your side while in a flat bed without using bedrails?: None Help needed moving from lying on your back to sitting on the side of a flat  bed without using bedrails?: None Help needed moving to and from a bed to a chair (including a wheelchair)?: None Help needed standing up from a chair using your arms (e.g., wheelchair or bedside chair)?: A Little Help needed to walk in hospital room?: A Little Help needed climbing 3-5 steps with a railing? : A Little 6 Click Score: 21    End of Session Equipment Utilized During Treatment: Gait belt;Oxygen Activity Tolerance: Patient tolerated treatment well Patient left: with call bell/phone within reach;with nursing/sitter in room Nurse Communication: Mobility status;Precautions       Time: 8718-3672 PT Time Calculation (min) (ACUTE ONLY): 16 min  Charges:  $Gait Training: 8-22 mins                    Chesley Noon, PTA 09/27/20, 11:02 AM

## 2020-09-27 NOTE — Progress Notes (Signed)
   09/27/20 1048  Assess: MEWS Score  Temp 97.8 F (36.6 C)  BP 98/73  Pulse Rate (!) 102  Resp 16  SpO2 100 %  Assess: MEWS Score  MEWS Temp 0  MEWS Systolic 1  MEWS Pulse 1  MEWS RR 0  MEWS LOC 0  MEWS Score 2  MEWS Score Color Yellow  Assess: if the MEWS score is Yellow or Red  Were vital signs taken at a resting state? Yes  Focused Assessment No change from prior assessment  Early Detection of Sepsis Score *See Row Information* Low  MEWS guidelines implemented *See Row Information* No, previously yellow, continue vital signs every 4 hours  Take Vital Signs  Increase Vital Sign Frequency  Yellow: Q 2hr X 2 then Q 4hr X 2, if remains yellow, continue Q 4hrs  Escalate  MEWS: Escalate Yellow: discuss with charge nurse/RN and consider discussing with provider and RRT  Notify: Charge Nurse/RN  Name of Charge Nurse/RN Notified Robin  Date Charge Nurse/RN Notified 09/27/20  Time Charge Nurse/RN Notified 1050  Document  Progress note created (see row info) Yes

## 2020-09-27 NOTE — Plan of Care (Signed)

## 2020-09-27 NOTE — Progress Notes (Signed)
PROGRESS NOTE    Kathy Vega  BJY:[id number] DOB: 1976/11/21 DOA: 08/13/2020 PCP: [facility] [place]    Chief Complaint  Patient presents with  . Shortness of Breath    Brief Narrative:   44 yo unvaccinated female admitted on 08/13/20 with COVID-19 pneumonia & ARDS requiring intubation on 9/17 and mechanical ventilation.  While in ICU, attempts made to transfer to [facility] or [facility] for ECMO but no beds were available and it was not certain patient would be candidate.  Proning protocols were followed while in ICU.  Patient required maximal ventilation support with sedation and remained critically ill.  She was weaned to FiO2 60% on 10/1.  Tracheostomy was placed on 09/04/20.  Patient began tolerating trach collar O2 in short durations 10/12-13 and began OOB to chair.   Started working with PT on 10/15.  Treated with 7 days Cefepime due to tracheal aspirate culture growing Pseudomonas. Hospitalist service assumed care of patient on 10/18.   Currently on cuffed size 6.  Patient is still unable to pass enough air around trach yet to be able to use speech valvePer pulmonology and ENT, patient had very significant laryngeal edema, in addition to body habitus being a complicating factor with likely underlying sleep apnea and/or alveolar hypoventilation.   She is ambulating and CIR declined her, Likely progression to home health   Subjective:   She is sitting up in chair, she denies pain,  She is more mobile, trach suction is getting clear She declined labs  Assessment & Plan:   Active Problems:   COVID-19   Acute renal failure (ARF) (HCC)   Elevated liver enzymes   CAP (community acquired pneumonia)   Acute encephalopathy   Hypokalemia   Acute respiratory failure ([place])   Tracheostomy in place [facility])   Acute hypoxic respiratory failure secondary to COVID-19 pneumonia and ARDS Morbid obesity, possible OSA, possible alveolar hypoventilation (OHS) S/p course of remdesevir,  azithromycin & ceftriaxone.  Off airborne precautions. Tracheostomy placed 10/8.  Cefepime started 10/11 for suspected HCAP-tracheal aspriate +pseudomonas - course completed. Will need sleep study as outpatient   Tracheostomy status Pt had severe laryngeal edema, per PCCM and ENT.   No air passing around downsized trach due to persistent edema most likely.  Unable to use speech valve or cap trach yet.  follow up with ENT in 2 weeks (Dr. Tami Ribas) speech recommend to consider "pt being able to move to a Shorter trach vs the XL length to see if this would make a difference for PMV tolerance"  Please contact pulm/ENT on monday Need to do trach care teaching, likely will need to go home with home health  Troponin elevation-due to demand ischemia Hypertrophic cardiomyopathy Was treated with lasix Echo revealed EF 45-50% with severe concentric LVH, diastolic dysfunction Currently denies chest pain, euvolemic on exam Close monitor volume status Recommend outpatient cardiology follow-up  Normocytic anemia, likely anemia of chronic disease -Appear chronic -Per chart review her hemoglobin range from 6.5 to 7.1 2017-2019 -she received total of 5 units PRBC transfusion this hospitalization -No overt sign of bleeding, she reports stool is brown, urine is clear, she does reports h/o heavy menses  -Iron panel in September was unremarkable -she declined further lab test including TSH, Y86, folic acid, reticulocyte count , she prefers to have it done by her pcp -FOBT pending collection  Per chart review she is supposed to take thyroid supplement at home and iron supplement tsh ordered, but She does not want  it done   Mild acute renal failure -resolved Hypokalemia -resolved Hypernatremia -resolved -All remained stable   Hyperglycemia likely related to tube feeds, she is now tolerating regular diet, no hyperglycemia  Body mass index is 36.32 kg/m.   DVT prophylaxis: SCDs Start: 08/13/20  2035   Code Status: full  Family Communication: declined my offer to update her family again Disposition:   Status is: Inpatient  Dispo: The patient is from: home              Anticipated d/c is to: likely home with home health next week               Consultants:   Critical care  ENT Dr Tami Ribas  CIR  Procedures:   Intubation/extubation  tracheostomy  prbc transfusionx5  Antimicrobials:   As above, currently off abx     Objective: Vitals:   09/27/20 0833 09/27/20 0835 09/27/20 1048 09/27/20 1100  BP:   98/73   Pulse:   (!) 102 88  Resp:   16   Temp:   97.8 F (36.6 C)   TempSrc:   Axillary   SpO2: 100% 100% 100%   Weight:      Height:        Intake/Output Summary (Last 24 hours) at 09/27/2020 1540 Last data filed at 09/27/2020 1418 Gross per 24 hour  Intake 600 ml  Output --  Net 600 ml   Filed Weights   09/25/20 0500 09/26/20 0500 09/27/20 0500  Weight: 112.5 kg 112.9 kg 112 kg    Examination:  General exam: calm, NAD, + trach Respiratory system: breath sound less coarse, ,no wheezing ,respiratory effort normal. Cardiovascular system: S1 & S2 heard, RRR. No pedal edema. Gastrointestinal system: Abdomen is nondistended, soft and nontender. Normal bowel sounds heard. Central nervous system: Alert and oriented. No focal neurological deficits. Extremities: Symmetric 5 x 5 power. Skin: No rashes, lesions or ulcers Psychiatry: Judgement and insight appear normal. Mood & affect appropriate.     Data Reviewed: I have personally reviewed following labs and imaging studies  CBC: Recent Labs  Lab 09/22/20 0412 09/24/20 0432  WBC 5.6 4.5  HGB 10.4* 9.7*  HCT 32.9* 30.6*  MCV 79.9* 80.5  PLT 349 588    Basic Metabolic Panel: Recent Labs  Lab 09/22/20 0412 09/24/20 0432  NA 137 136  K 3.8 3.7  CL 102 100  CO2 25 25  GLUCOSE 89 96  BUN 12 12  CREATININE 0.80 0.69  CALCIUM 9.9 9.6    GFR: Estimated Creatinine Clearance: 118.7  mL/min (by C-G formula based on SCr of 0.69 mg/dL).  Liver Function Tests: No results for input(s): AST, ALT, ALKPHOS, BILITOT, PROT, ALBUMIN in the last 168 hours.  CBG: Recent Labs  Lab 09/21/20 1603 09/21/20 2021 09/22/20 0838 09/22/20 1249 09/22/20 1706  GLUCAP 99 108* 85 95 101*     Recent Results (from the past 240 hour(s))  MRSA PCR Screening     Status: None   Collection Time: 09/22/20 12:09 PM   Specimen: Nasopharyngeal  Result Value Ref Range Status   MRSA by PCR NEGATIVE NEGATIVE Final    Comment:        The GeneXpert MRSA Assay (FDA approved for NASAL specimens only), is one component of a comprehensive MRSA colonization surveillance program. It is not intended to diagnose MRSA infection nor to guide or monitor treatment for MRSA infections. Performed at [facility], [address] [place], [place] [address]  Radiology Studies: No results found.      Scheduled Meds: . enoxaparin (LOVENOX) injection  0.5 mg/kg Subcutaneous Q24H  . feeding supplement  1 Container Oral TID BM  . mouth rinse  15 mL Mouth Rinse q12n4p  . nystatin  5 mL Oral QID  . pantoprazole  40 mg Oral BID AC   Continuous Infusions: . sodium chloride Stopped (09/07/20 0725)     LOS: 45 days   Time spent: 37mins Greater than 50% of this time was spent in counseling, explanation of diagnosis, planning of further management, and coordination of care.  I have personally reviewed and interpreted on  09/27/2020 daily labs, I reviewed all nursing notes, pharmacy notes, consultant notes,  vitals, pertinent old records  I have discussed plan of care as described above with RN , patient  on 09/27/2020  Voice Recognition /Dragon dictation system was used to create this note, attempts have been made to correct errors. Please contact the author with questions and/or clarifications.   Florencia Reasons, MD PhD FACP [facility]  Available via Epic secure chat  7am-7pm for nonurgent issues Please page for urgent issues To page the attending provider between 7A-7P or the covering provider during after hours 7P-7A, please log into the web site [email or web address] and access using universal [place] password for that web site. If you do not have the password, please call the hospital operator.    09/27/2020, 3:40 PM

## 2020-09-28 DIAGNOSIS — R748 Abnormal levels of other serum enzymes: Secondary | ICD-10-CM | POA: Diagnosis not present

## 2020-09-28 DIAGNOSIS — Z930 Tracheostomy status: Secondary | ICD-10-CM | POA: Diagnosis not present

## 2020-09-28 DIAGNOSIS — U071 COVID-19: Secondary | ICD-10-CM | POA: Diagnosis not present

## 2020-09-28 DIAGNOSIS — J9601 Acute respiratory failure with hypoxia: Secondary | ICD-10-CM | POA: Diagnosis not present

## 2020-09-28 NOTE — Progress Notes (Signed)
Physical Therapy Treatment Patient Details Name: Kathy Vega MRN: [id number] DOB: Oct 04, 1976 Today's Date: 09/28/2020    History of Present Illness Pt is a 44 y.o. female presenting to hospital 9/16 with SOB and confusion; initial O2 sats in 20's; pt COVID (+) 6 days prior.  Mechanically intubated 9/17.  S/p trach 10/8.  Pt admitted with acute hypoxic respiratory failure secondary COVID-19 PNA and ARDS, possible OSA with upper airway swelling s/p trach, and severe acute hypoxic and hypercapnic respiratory failure.  PMH includes anemia, thyroid disease, h/o leg surgery.    PT Comments    Able to walk in facility 1000'+ on 3 lpm through trach collar with sats 99-100%.  No LOB or safety issues.  Decreased step height and length and little to no arm swing.  Reports general muscle fatigue in LE's but overall doing quite well.     Follow Up Recommendations  Supervision/Assistance - 24 hour;Supervision for mobility/OOB;Home health PT     Equipment Recommendations  None recommended by PT    Recommendations for Other Services       Precautions / Restrictions Precautions Precautions: Fall Precaution Comments: Trach (trach collar); HOB >30 degrees Restrictions Weight Bearing Restrictions: No    Mobility  Bed Mobility Overal bed mobility: Modified Independent                Transfers Overall transfer level: Modified independent     Sit to Stand: Modified independent (Device/Increase time)            Ambulation/Gait Ambulation/Gait assistance: Supervision Gait Distance (Feet): 1000 Feet Assistive device: None   Gait velocity: decreased   General Gait Details: 3L o2 trach collar FIO2 - able to walk multiple laps on unit and down main hallway without difficulty.   Stairs             Wheelchair Mobility    Modified Rankin (Stroke Patients Only)       Balance Overall balance assessment: Needs assistance Sitting-balance support: No upper extremity  supported;Feet supported Sitting balance-Leahy Scale: Normal     Standing balance support: No upper extremity supported Standing balance-Leahy Scale: Good Standing balance comment: no LOB in standing without UE support                            Cognition Arousal/Alertness: Awake/alert Behavior During Therapy: WFL for tasks assessed/performed Overall Cognitive Status: Within Functional Limits for tasks assessed                                        Exercises      General Comments        Pertinent Vitals/Pain Pain Assessment: No/denies pain    Home Living                      Prior Function            PT Goals (current goals can now be found in the care plan section) Progress towards PT goals: Progressing toward goals    Frequency    7X/week      PT Plan Current plan remains appropriate    Co-evaluation              AM-PAC PT "6 Clicks" Mobility   Outcome Measure  Help needed turning from your back to your side while in a flat  bed without using bedrails?: None Help needed moving from lying on your back to sitting on the side of a flat bed without using bedrails?: None Help needed moving to and from a bed to a chair (including a wheelchair)?: None Help needed standing up from a chair using your arms (e.g., wheelchair or bedside chair)?: None Help needed to walk in hospital room?: None Help needed climbing 3-5 steps with a railing? : A Little 6 Click Score: 23    End of Session Equipment Utilized During Treatment: Gait belt;Oxygen Activity Tolerance: Patient tolerated treatment well Patient left: with call bell/phone within reach;with nursing/sitter in room Nurse Communication: Mobility status;Precautions       Time: 1898-4210 PT Time Calculation (min) (ACUTE ONLY): 23 min  Charges:  $Gait Training: 23-37 mins                    Chesley Noon, PTA 09/28/20, 12:52 PM

## 2020-09-28 NOTE — Progress Notes (Signed)
09/28/2020 11:38 AM  Kathy Vega [id number]  Post-Op Day 24    Temp:  [97.5 F (36.4 C)-98.9 F (37.2 C)] 98.3 F (36.8 C) (11/01 0840) Pulse Rate:  [93-101] 98 (11/01 0840) Resp:  [17-20] 17 (11/01 0840) BP: (96-107)/(64-77) 104/64 (11/01 0840) SpO2:  [97 %-100 %] 100 % (11/01 0840) FiO2 (%):  [28 %] 28 % (11/01 0740),     Intake/Output Summary (Last 24 hours) at 09/28/2020 1138 Last data filed at 09/28/2020 0900 Gross per 24 hour  Intake 720 ml  Output --  Net 720 ml    No results found for this or any previous visit (from the past 24 hour(s)).  SUBJECTIVE: The patient had tracheostomy done on October 8 and then her trach tube changed from #8 to a #6 on October 15.  She has had problems using a speaking valve although she is breathing easily through the trach tube and not requiring any BiPAP at night.  Her voice is still very hoarse.  OBJECTIVE: Patient is failing speaking valve trials with thin 45 seconds.  She has backup of air in her lungs but she cannot exhale out to her larynx.  She still has a 6 Shiley XLT length tube in place.  IMPRESSION: Patient likely still has some laryngeal edema or subglottic blockage.  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    PLAN: She is nearing discharge and should go home with current trach tube.  She will need flexible laryngoscopy to make sure the larynx swelling is down but may also need direct laryngoscopy for removal of granulation tissue or addressing subglottic stenosis.  She has been scheduled to see Dr. Tami Ribas in the office for follow-up of this.  I would not try changing the trach tube prior to her evaluation.  [name] 09/28/2020, 11:38 AM

## 2020-09-28 NOTE — Progress Notes (Signed)
PROGRESS NOTE    Kathy Vega  JAS:[id number] DOB: 27-Aug-1976 DOA: 08/13/2020 PCP: [facility]   Brief Narrative:  44 yo unvaccinated female admitted on 08/13/20 with COVID-19 pneumonia &ARDS requiring intubation on 9/17 and mechanical ventilation. While in ICU, attempts made to transfer to [facility] or [facility] for ECMO but no beds were available and it was not certain patient would be candidate. Proning protocols were followed while in ICU. Patient required maximal ventilation support with sedation and remained critically ill. She was weaned to FiO2 60% on 10/1. Tracheostomy was placed on 09/04/20. Patient began tolerating trach collar O2 in short durations 10/12-13 and began OOB to chair. Started working with PT on 10/15. Treated with 7 days Cefepime due to tracheal aspirate culture growing Pseudomonas. Hospitalist service assumed care of patient on 10/18.   Currently on cuffed size 6. Patient is still unable to pass enough air around trach yet to be able to use speech valvePer pulmonology and ENT, patient had very significant laryngeal edema, in addition to body habitus being a complicating factor with likely underlying sleep apnea and/or alveolar hypoventilation.  She is ambulating and CIR declined her, Likely progression to home health.  09/28/2020. ENT reevaluated her and there was some concern of persistent laryngeal edema/subglottis stenosis with prolonged intubation or granulation tissue formation. They are recommending to keep current trach and follow-up with them as an outpatient for laryngoscopy and further management.  Subjective: Patient was sitting in the chair when seen today. She was concerned about persistent of her swelling in neck and does not want to change her trach. I told her that I have asked ENT to reevaluate her and they will put their recommendations regarding her trach.  Assessment & Plan:   Active Problems:   COVID-19   Acute renal  failure (ARF) (HCC)   Elevated liver enzymes   CAP (community acquired pneumonia)   Acute encephalopathy   Hypokalemia   Acute respiratory failure ([place])   Tracheostomy in place [facility])  Acute hypoxic respiratory failure secondary to COVID-19 pneumonia and ARDS Morbid obesity, possible OSA, possible alveolar hypoventilation (OHS) S/p course of remdesevir, azithromycin &ceftriaxone.  Off airborne precautions. Tracheostomy placed 10/8.  Cefepime started 10/11 for suspected HCAP-tracheal aspriate +pseudomonas - course completed. Will need sleep study as outpatient  Tracheostomystatus Pt had severe laryngeal edema, per PCCM and ENT.  No air passing around downsized trach due to persistent edema most likely. Unable to use speech valve or cap trach yet.  follow up with ENT in 2 weeks (Dr. Tami Ribas) ENT reevaluated her today and they recommend follow-up with Dr. Tami Ribas and no change in her current trach for concern of subglottis stenosis/granulation tissue formation and persistence of laryngeal edema.  Troponin elevation-due to demand ischemia Hypertrophic cardiomyopathy Was treated with lasix Echo revealed EF 45-50% with severe concentric LVH, diastolic dysfunction Currently denies chest pain, euvolemic on exam Close monitor volume status Recommend outpatient cardiology follow-up  Normocytic anemia, likely anemia of chronic disease -Appear chronic -Per chart review her hemoglobin range from 6.5 to 7.1 2017-2019 -she received total of 5 units PRBC transfusion this hospitalization -No overt sign of bleeding, she reports stool is brown, urine is clear, she does reports h/o heavy menses  -Iron panel in September was unremarkable -she declined further lab test including TSH, A19, folic acid, reticulocyte count , she prefers to have it done by her pcp -FOBT pending collection  Per chart review she is supposed to take thyroid supplement at home  and iron supplement tsh ordered, but She  does not want it done   Mild acute renal failure -resolved Hypokalemia -resolved Hypernatremia -resolved -All remained stable   Hyperglycemia likely related to tube feeds, she is now tolerating regular diet, no hyperglycemia  Body mass index is 36.32 kg/m.  Objective: Vitals:   09/28/20 0700 09/28/20 0740 09/28/20 0840 09/28/20 1159  BP:   104/64 101/69  Pulse:   98 95  Resp:   17 18  Temp:   98.3 F (36.8 C) 98.4 F (36.9 C)  TempSrc:   Axillary Axillary  SpO2: 99% 99% 100% 100%  Weight:      Height:        Intake/Output Summary (Last 24 hours) at 09/28/2020 1429 Last data filed at 09/28/2020 1358 Gross per 24 hour  Intake 720 ml  Output --  Net 720 ml   Filed Weights   09/25/20 0500 09/26/20 0500 09/27/20 0500  Weight: 112.5 kg 112.9 kg 112 kg    Examination:  General exam: Appears calm and comfortable, trach in place. Respiratory system: Clear to auscultation. Respiratory effort normal. Cardiovascular system: S1 & S2 heard, RRR.  Gastrointestinal system: Soft, nontender, nondistended, bowel sounds positive. Central nervous system: Alert and oriented. No focal neurological deficits. Extremities: No edema, no cyanosis, pulses intact and symmetrical. Psychiatry: Judgement and insight appear normal.   DVT prophylaxis: SCDs Code Status: Full Family Communication: Discussed with patient. Disposition Plan:  Status is: Inpatient  Remains inpatient appropriate because:Inpatient level of care appropriate due to severity of illness   Dispo: The patient is from: Home              Anticipated d/c is to: Home              Anticipated d/c date is: 1 day              Patient currently is not medically stable to d/c.    Consultants:   PCCM  ENT  CIR  Procedures:  Antimicrobials:   Data Reviewed: I have personally reviewed following labs and imaging studies  CBC: Recent Labs  Lab 09/22/20 0412 09/24/20 0432  WBC 5.6 4.5  HGB 10.4* 9.7*  HCT 32.9*  30.6*  MCV 79.9* 80.5  PLT 349 270   Basic Metabolic Panel: Recent Labs  Lab 09/22/20 0412 09/24/20 0432  NA 137 136  K 3.8 3.7  CL 102 100  CO2 25 25  GLUCOSE 89 96  BUN 12 12  CREATININE 0.80 0.69  CALCIUM 9.9 9.6   GFR: Estimated Creatinine Clearance: 118.7 mL/min (by C-G formula based on SCr of 0.69 mg/dL). Liver Function Tests: No results for input(s): AST, ALT, ALKPHOS, BILITOT, PROT, ALBUMIN in the last 168 hours. No results for input(s): LIPASE, AMYLASE in the last 168 hours. No results for input(s): AMMONIA in the last 168 hours. Coagulation Profile: No results for input(s): INR, PROTIME in the last 168 hours. Cardiac Enzymes: No results for input(s): CKTOTAL, CKMB, CKMBINDEX, TROPONINI in the last 168 hours. BNP (last 3 results) No results for input(s): PROBNP in the last 8760 hours. HbA1C: No results for input(s): HGBA1C in the last 72 hours. CBG: Recent Labs  Lab 09/21/20 1603 09/21/20 2021 09/22/20 0838 09/22/20 1249 09/22/20 1706  GLUCAP 99 108* 85 95 101*   Lipid Profile: No results for input(s): CHOL, HDL, LDLCALC, TRIG, CHOLHDL, LDLDIRECT in the last 72 hours. Thyroid Function Tests: No results for input(s): TSH, T4TOTAL, FREET4, T3FREE, THYROIDAB in the last  72 hours. Anemia Panel: No results for input(s): VITAMINB12, FOLATE, FERRITIN, TIBC, IRON, RETICCTPCT in the last 72 hours. Sepsis Labs: No results for input(s): PROCALCITON, LATICACIDVEN in the last 168 hours.  Recent Results (from the past 240 hour(s))  MRSA PCR Screening     Status: None   Collection Time: 09/22/20 12:09 PM   Specimen: Nasopharyngeal  Result Value Ref Range Status   MRSA by PCR NEGATIVE NEGATIVE Final    Comment:        The GeneXpert MRSA Assay (FDA approved for NASAL specimens only), is one component of a comprehensive MRSA colonization surveillance program. It is not intended to diagnose MRSA infection nor to guide or monitor treatment for MRSA  infections. Performed at [facility], [address] [place], [place] [address]      Radiology Studies: No results found.  Scheduled Meds: . enoxaparin (LOVENOX) injection  0.5 mg/kg Subcutaneous Q24H  . feeding supplement  1 Container Oral TID BM  . mouth rinse  15 mL Mouth Rinse q12n4p  . nystatin  5 mL Oral QID  . pantoprazole  40 mg Oral BID AC   Continuous Infusions: . sodium chloride Stopped (09/07/20 0725)     LOS: 46 days   Time spent: 35 minutes. More than 50% of that time spent with direct patient care and reviewing her chart.  Lorella Nimrod, MD [facility]  If 7PM-7AM, please contact night-coverage [email or web address]  09/28/2020, 2:29 PM   This record has been created using Systems analyst. Errors have been sought and corrected,but may not always be located. Such creation errors do not reflect on the standard of care.

## 2020-09-29 DIAGNOSIS — J9601 Acute respiratory failure with hypoxia: Secondary | ICD-10-CM | POA: Diagnosis not present

## 2020-09-29 MED ORDER — ENSURE MAX PROTEIN PO LIQD
11.0000 [oz_av] | Freq: Two times a day (BID) | ORAL | Status: DC
  Filled 2020-09-29: qty 330

## 2020-09-29 NOTE — Progress Notes (Signed)
PT Cancellation Note  Patient Details Name: RAEL TILLY MRN: [id number] DOB: September 18, 1976   Cancelled Treatment:    Reason Eval/Treat Not Completed: Other (comment) Pt lying in bed upon arrival to room. When asked how she was feeling today, pt shook her head no. She denied any pain or other complaints other than she was not feeling well. Pt encouraged to participate in bed level therex for some movement and strengthening and pt also declined. Will continue to follow and make attempt another date when pt agreeable to session.   Vale Haven 09/29/2020, 5:07 PM

## 2020-09-29 NOTE — Progress Notes (Signed)
Nutrition Follow-up  DOCUMENTATION CODES:   Obesity unspecified  INTERVENTION:  Discontinued Boost Breeze.  Provide Ensure Max Protein po BID, each supplement provides 150 kcal and 30 grams of protein.  Continue Magic cup TID with meals, each supplement provides 290 kcal and 9 grams of protein.  NUTRITION DIAGNOSIS:   Increased nutrient needs related to catabolic illness ([id number]) as evidenced by estimated needs.  Ongoing.  GOAL:   Patient will meet greater than or equal to 90% of their needs  Met with calories, progressing with protein.  MONITOR:   PO intake, Supplement acceptance, Labs, Weight trends, I & O's  REASON FOR ASSESSMENT:   Ventilator, Consult Enteral/tube feeding initiation and management  ASSESSMENT:   44 year old female admitted with COVID-19 PNA.  9/17 intubated 10/8 s/p tracheostomy tube placement 10/18 transferred from ICU to med/surg 10/19 s/p MBSS: diet advanced to dysphagia 3 with thin liquids  Met with patient at bedside. She reports her appetite is good and she is eating well at meals. She reports she is starting to get tired of the food. According to chart patient ate 100% of her meals yesterday. She is still Financial risk analyst Cup on trays, as well. Patient reports she does not like Boost Breeze and would like these discontinued. She is willing to try Ensure Max Protein after discussion on importance of adequate intake. In the past 24 hours patient has had approximately 2691 kcal (100% estimated needs) and 94 grams of protein (72% minimum estimated needs).  Medications reviewed and include: Protonix.  Labs reviewed: CBG 85-101.  I/O: 3 occurrences unmeasured UOP yesterday  Weight trend: 112 kg from 10/31; -7.2 kg from 9/16  Diet Order:   Diet Order            Diet regular Room service appropriate? Yes with Assist; Fluid consistency: Thin  Diet effective now                EDUCATION NEEDS:   No education needs have been identified  at this time  Skin:  Skin Assessment: Reviewed RN Assessment (no wounds seen any longer per RN documentation)  Last BM:  09/28/2020 - large type 4  Height:   Ht Readings from Last 1 Encounters:  09/04/20 5' 9.13" (1.756 m)   Weight:   Wt Readings from Last 1 Encounters:  09/27/20 112 kg   Ideal Body Weight:  65.9 kg  BMI:  Body mass index is 36.32 kg/m.  Estimated Nutritional Needs:   Kcal:  2600-2800  Protein:  130-140  Fluid:  >/= 2 L/day  Jacklynn Barnacle, MS, RD, LDN Pager number available on Amion

## 2020-09-29 NOTE — TOC Progression Note (Signed)
Transition of Care [facility]) - Progression Note    Patient Details  Name: Kathy Vega MRN: [id number] Date of Birth: [birth year]  [facility] CM/SW Contact  Shelbie Hutching, RN Phone Number: 09/29/2020, 3:59 PM  Clinical Narrative:    RNCM spoke with patient and updated that we are trying to find home health services for patient to go home with.  Message left with Maxim this morning, RNCM just called and left another message for return call.  Trach supplies ordered from [employer] as well as [facility].  Once home health services are established patient will be able to discharge.    Expected Discharge Plan: [place] Barriers to Discharge: No [place] will accept this patient  Expected Discharge Plan and Services Expected Discharge Plan: [place]   Discharge Planning Services: CM Consult Post Acute Care Choice: [place] arrangements for the past 2 months: Single Family Home                 DME Arranged: 3-N-1, Walker rolling, Trach supplies, Oxygen DME Agency: [employer] Date DME Agency Contacted: 09/25/20 Time DME Agency Contacted: [id number] Representative spoke with at DME Agency: [place] ([place]) Interventions    Readmission Risk Interventions Readmission Risk Prevention Plan 09/10/2020  Transportation Screening Complete  PCP or Specialist Appt within 3-5 Days Complete  HRI or [place] Complete  Social Work Consult for [place] Planning/Counseling Complete  Palliative Care Screening Complete  Medication Review Press photographer) Complete  Some recent data might be hidden

## 2020-09-29 NOTE — Progress Notes (Signed)
PROGRESS NOTE    Kathy Vega  YJE:[id number] DOB: 1976/04/30 DOA: 08/13/2020 PCP: [facility] [place]   Brief Narrative:  44 yo unvaccinated female admitted on 08/13/20 with COVID-19 pneumonia &ARDS requiring intubation on 9/17 and mechanical ventilation. While in ICU, attempts made to transfer to [facility] or [facility] for ECMO but no beds were available and it was not certain patient would be candidate. Proning protocols were followed while in ICU. Patient required maximal ventilation support with sedation and remained critically ill. She was weaned to FiO2 60% on 10/1. Tracheostomy was placed on 09/04/20. Patient began tolerating trach collar O2 in short durations 10/12-13 and began OOB to chair. Started working with PT on 10/15. Treated with 7 days Cefepime due to tracheal aspirate culture growing Pseudomonas. Hospitalist service assumed care of patient on 10/18.   Currently on cuffed size 6. Patient is still unable to pass enough air around trach yet to be able to use speech valvePer pulmonology and ENT, patient had very significant laryngeal edema, in addition to body habitus being a complicating factor with likely underlying sleep apnea and/or alveolar hypoventilation.  She is ambulating and CIR declined her, Likely progression to home health.  09/28/2020. ENT reevaluated her and there was some concern of persistent laryngeal edema/subglottis stenosis with prolonged intubation or granulation tissue formation. They are recommending to keep current trach and follow-up with them as an outpatient for laryngoscopy and further management.  Subjective: Patient has no new complaint when seen today.  She wants to go home. Apparently there is no home health agency which is able to provide care she needs at home due to her insurance issues.  TOC is working on it.  Assessment & Plan:   Active Problems:   Pneumonia due to COVID-19 virus   Acute renal failure (ARF) (HCC)    Elevated liver enzymes   CAP (community acquired pneumonia)   Acute encephalopathy   Hypokalemia   Acute respiratory failure ([place])   Tracheostomy in place [facility])  Acute hypoxic respiratory failure secondary to COVID-19 pneumonia and ARDS Morbid obesity, possible OSA, possible alveolar hypoventilation (OHS) S/p course of remdesevir, azithromycin &ceftriaxone.  Off airborne precautions. Tracheostomy placed 10/8.  Cefepime started 10/11 for suspected HCAP-tracheal aspriate +pseudomonas - course completed. Will need sleep study as outpatient  Tracheostomystatus Pt had severe laryngeal edema, per PCCM and ENT.  No air passing around downsized trach due to persistent edema most likely. Unable to use speech valve or cap trach yet.  follow up with ENT in 2 weeks (Dr. Tami Ribas) ENT reevaluated her yesterday and they recommend follow-up with Dr. Tami Ribas and no change in her current trach for concern of subglottis stenosis/granulation tissue formation and persistence of laryngeal edema.  Troponin elevation-due to demand ischemia Hypertrophic cardiomyopathy Was treated with lasix Echo revealed EF 45-50% with severe concentric LVH, diastolic dysfunction Currently denies chest pain, euvolemic on exam Close monitor volume status Recommend outpatient cardiology follow-up  Normocytic anemia, likely anemia of chronic disease -Appear chronic -Per chart review her hemoglobin range from 6.5 to 7.1 2017-2019 -she received total of 5 units PRBC transfusion this hospitalization -No overt sign of bleeding, she reports stool is brown, urine is clear, she does reports h/o heavy menses  -Iron panel in September was unremarkable -she declined further lab test including TSH, O37, folic acid, reticulocyte count , she prefers to have it done by her pcp -FOBT pending collection  Per chart review she is supposed to take thyroid supplement at home and  iron supplement tsh ordered, but She does not want it  done   Mild acute renal failure -resolved Hypokalemia -resolved Hypernatremia -resolved -All remained stable   Hyperglycemia likely related to tube feeds, she is now tolerating regular diet, no hyperglycemia  Body mass index is 36.32 kg/m.  Objective: Vitals:   09/29/20 0008 09/29/20 0517 09/29/20 0852 09/29/20 1152  BP: 114/77 114/83 109/86 117/80  Pulse: 84 95 (!) 104 99  Resp: 18 18 19 16   Temp: (!) 96.6 F (35.9 C) 98 F (36.7 C) 97.7 F (36.5 C) (!) 97.5 F (36.4 C)  TempSrc: Axillary Axillary Oral Oral  SpO2: 100% 100% 100% 100%  Weight:      Height:       No intake or output data in the 24 hours ending 09/29/20 1536 Filed Weights   09/25/20 0500 09/26/20 0500 09/27/20 0500  Weight: 112.5 kg 112.9 kg 112 kg    Examination:  General.  Well-developed, obese lady, in no acute distress.  Trach in place. Pulmonary.  Lungs clear bilaterally, normal respiratory effort. CV.  Regular rate and rhythm, no JVD, rub or murmur. Abdomen.  Soft, nontender, nondistended, BS positive. CNS.  Alert and oriented x3.  No focal neurologic deficit. Extremities.  No edema, no cyanosis, pulses intact and symmetrical. Psychiatry.  Judgment and insight appears normal.  DVT prophylaxis: SCDs Code Status: Full Family Communication: Discussed with patient. Disposition Plan:  Status is: Inpatient  Remains inpatient appropriate because:Inpatient level of care appropriate due to severity of illness   Dispo: The patient is from: Home              Anticipated d/c is to: Home              Anticipated d/c date is: 1 day              Patient currently is medically stable.  Unable to find home health care due to her insurance.  TOC is working on it.  Insurance already declined CIR and LTAC.  Unsafe disposition at this time.  Consultants:   PCCM  ENT  CIR  Procedures:  Antimicrobials:   Data Reviewed: I have personally reviewed following labs and imaging studies  CBC: Recent  Labs  Lab 09/24/20 0432  WBC 4.5  HGB 9.7*  HCT 30.6*  MCV 80.5  PLT 419   Basic Metabolic Panel: Recent Labs  Lab 09/24/20 0432  NA 136  K 3.7  CL 100  CO2 25  GLUCOSE 96  BUN 12  CREATININE 0.69  CALCIUM 9.6   GFR: Estimated Creatinine Clearance: 118.7 mL/min (by C-G formula based on SCr of 0.69 mg/dL). Liver Function Tests: No results for input(s): AST, ALT, ALKPHOS, BILITOT, PROT, ALBUMIN in the last 168 hours. No results for input(s): LIPASE, AMYLASE in the last 168 hours. No results for input(s): AMMONIA in the last 168 hours. Coagulation Profile: No results for input(s): INR, PROTIME in the last 168 hours. Cardiac Enzymes: No results for input(s): CKTOTAL, CKMB, CKMBINDEX, TROPONINI in the last 168 hours. BNP (last 3 results) No results for input(s): PROBNP in the last 8760 hours. HbA1C: No results for input(s): HGBA1C in the last 72 hours. CBG: Recent Labs  Lab 09/22/20 1706  GLUCAP 101*   Lipid Profile: No results for input(s): CHOL, HDL, LDLCALC, TRIG, CHOLHDL, LDLDIRECT in the last 72 hours. Thyroid Function Tests: No results for input(s): TSH, T4TOTAL, FREET4, T3FREE, THYROIDAB in the last 72 hours. Anemia Panel: No results for input(s):  VITAMINB12, FOLATE, FERRITIN, TIBC, IRON, RETICCTPCT in the last 72 hours. Sepsis Labs: No results for input(s): PROCALCITON, LATICACIDVEN in the last 168 hours.  Recent Results (from the past 240 hour(s))  MRSA PCR Screening     Status: None   Collection Time: 09/22/20 12:09 PM   Specimen: Nasopharyngeal  Result Value Ref Range Status   MRSA by PCR NEGATIVE NEGATIVE Final    Comment:        The GeneXpert MRSA Assay (FDA approved for NASAL specimens only), is one component of a comprehensive MRSA colonization surveillance program. It is not intended to diagnose MRSA infection nor to guide or monitor treatment for MRSA infections. Performed at [facility], [address] [place], [place]  [address]      Radiology Studies: No results found.  Scheduled Meds: . enoxaparin (LOVENOX) injection  0.5 mg/kg Subcutaneous Q24H  . mouth rinse  15 mL Mouth Rinse q12n4p  . nystatin  5 mL Oral QID  . pantoprazole  40 mg Oral BID AC  . Ensure Max Protein  11 oz Oral BID BM   Continuous Infusions: . sodium chloride Stopped (09/07/20 0725)     LOS: 47 days   Time spent: 20 minutes. More than 50% of that time spent with direct patient care and reviewing her chart.  Lorella Nimrod, MD [facility]  If 7PM-7AM, please contact night-coverage [email or web address]  09/29/2020, 3:36 PM   This record has been created using Systems analyst. Errors have been sought and corrected,but may not always be located. Such creation errors do not reflect on the standard of care.

## 2020-09-29 NOTE — Progress Notes (Signed)
Occupational Therapy Treatment Patient Details Name: Kathy Vega MRN: [id number] DOB: 05-Nov-1976 Today's Date: 09/29/2020    History of present illness Pt is a 44 y.o. female presenting to hospital 9/16 with SOB and confusion; initial O2 sats in 20's; pt COVID (+) 6 days prior.  Mechanically intubated 9/17.  S/p trach 10/8.  Pt admitted with acute hypoxic respiratory failure secondary COVID-19 PNA and ARDS, possible OSA with upper airway swelling s/p trach, and severe acute hypoxic and hypercapnic respiratory failure.  PMH includes anemia, thyroid disease, h/o leg surgery.   OT comments  Pt seen for OT treatment this date to f/u re: safety with ADLs/ADL mobility. Pt with improved tolerance, strength, and balance to perform self care and ADL mobility safely. Pt is able to perform ADLs and transfers with no AD with good safety awareness and control at MOD I level. OT provides very minimal cues for pacing and attn to lines/leads. Pt with good understanding and response to cues. OT provided handout and demo of UB/core theraband HEP and issues pt both blue resistance and black resistance band to support and further progress her already-improving strength and tolerance. OT educates re: anchoring band properly, other aspects of safety, form, technique and pacing with consideration to her breathing/percieved exertion. Pt with good understanding. Anticipate pt could benefit from carryover while in acute setting and/or at home to ensure safety/form with HEP. Discharge recommendation updated to home with HHOT versus CIR as pt has progressed beyond requiring extensive rehabilitation stay, but will still require f/u to ensure safety and continued progress with self care ADL skills.    Follow Up Recommendations  Home health OT    Equipment Recommendations  3 in 1 bedside commode;Tub/shower seat;Other (comment) (grab bars in shower/restroom)    Recommendations for Other Services      Precautions /  Restrictions Precautions Precautions: Fall Precaution Comments: Trach (trach collar); HOB >30 degrees Restrictions Weight Bearing Restrictions: No       Mobility Bed Mobility Overal bed mobility: Modified Independent                Transfers Overall transfer level: Modified independent Equipment used: None                  Balance Overall balance assessment: Modified Independent                                         ADL either performed or assessed with clinical judgement   ADL Overall ADL's : Modified independent     Grooming: Wash/dry face;Oral care;Standing;Modified independent Grooming Details (indicate cue type and reason): sink-side with no AD with no physical assist, one cue for pacing/rest break             Lower Body Dressing: Modified independent;Sit to/from stand               Functional mobility during ADLs: Modified independent;Supervision/safety (no AD with MIN verbal cues for pacing)       Vision Patient Visual Report: No change from baseline     Perception     Praxis      Cognition Arousal/Alertness: Awake/alert Behavior During Therapy: WFL for tasks assessed/performed Overall Cognitive Status: Within Functional Limits for tasks assessed  General Comments: Pt was A and O x 4        Exercises Other Exercises Other Exercises: OT provided handout and demo of UB/core theraband HEP and issues pt both blue resistance and black resistance band to support and further progress her already-improving strength and tolerance. OT educates re: anchoring band properly, other aspects of safety, form, technique and pacing with consideration to her breathing/percieved exertion. Pt with good understanding. Anticipate pt could benefit from carryover while in acute setting and/or at home to ensure safety/form.   Shoulder Instructions       General Comments      Pertinent  Vitals/ Pain       Pain Assessment: No/denies pain  Home Living                                          Prior Functioning/Environment              Frequency  Min 1X/week        Progress Toward Goals  OT Goals(current goals can now be found in the care plan section)  Progress towards OT goals: Progressing toward goals  Acute Rehab OT Goals Patient Stated Goal: go home OT Goal Formulation: With patient Time For Goal Achievement: 10/06/20 Potential to Achieve Goals: Good  Plan Discharge plan needs to be updated;Frequency needs to be updated    Co-evaluation                 AM-PAC OT "6 Clicks" Daily Activity     Outcome Measure   Help from another person eating meals?: None Help from another person taking care of personal grooming?: None Help from another person toileting, which includes using toliet, bedpan, or urinal?: None Help from another person bathing (including washing, rinsing, drying)?: A Little Help from another person to put on and taking off regular upper body clothing?: None Help from another person to put on and taking off regular lower body clothing?: A Little 6 Click Score: 22    End of Session    OT Visit Diagnosis: Muscle weakness (generalized) (M62.81);Unsteadiness on feet (R26.81)   Activity Tolerance Patient tolerated treatment well   Patient Left Other (comment) (seated EOB with call bell in reach)   Nurse Communication Mobility status;Other (comment) (notified CM that pt wanting to meet with her to discuss equipment delivery)        Time: 1010-1048 OT Time Calculation (min): 38 min  Charges: OT General Charges $OT Visit: 1 Visit OT Treatments $Self Care/Home Management : 8-22 mins $Therapeutic Activity: 8-22 mins $Therapeutic Exercise: 8-22 mins  Gerrianne Scale, [place], OTR/L ascom [phone] 09/29/20, 2:25 PM

## 2020-09-30 DIAGNOSIS — J9601 Acute respiratory failure with hypoxia: Secondary | ICD-10-CM | POA: Diagnosis not present

## 2020-09-30 MED ORDER — POLYETHYLENE GLYCOL 3350 17 G PO PACK: 17.0000 g | PACK | Freq: Every day | ORAL | Status: DC | PRN

## 2020-09-30 NOTE — TOC Progression Note (Signed)
Transition of Care [facility]) - Progression Note    Patient Details  Name: Kathy Vega MRN: [id number] Date of Birth: 1976-02-24  [facility] of [facility]) CM/SW Contact  Shelbie Hutching, RN Phone Number: 09/30/2020, 9:33 AM  Clinical Narrative:    [facility] has accepted referral for home health services for patient.  RNCM is working on getting in contact with patient's [employer] forwarder.  Message left with March Rummage her Medicaid Case Worker.  Trach supplies and NIV have been ordered through [employer].  Patient and patient's mother updated.   Expected Discharge Plan: [place] Barriers to Discharge: No [place] will accept this patient  Expected Discharge Plan and Services Expected Discharge Plan: [place]   Discharge Planning Services: CM Consult Post Acute Care Choice: [place] arrangements for the past 2 months: Single Family Home                 DME Arranged: 3-N-1, Walker rolling, Trach supplies, Oxygen DME Agency: [employer] Date DME Agency Contacted: 09/25/20 Time DME Agency Contacted: [id number] Representative spoke with at DME Agency: [place] ([place]) Interventions    Readmission Risk Interventions Readmission Risk Prevention Plan 09/10/2020  Transportation Screening Complete  PCP or Specialist Appt within 3-5 Days Complete  HRI or [place] Complete  Social Work Consult for [place] Planning/Counseling Complete  Palliative Care Screening Complete  Medication Review Press photographer) Complete  Some recent data might be hidden

## 2020-09-30 NOTE — Progress Notes (Signed)
PROGRESS NOTE    Kathy Vega  IFO:[id number] DOB: 21-Mar-1976 DOA: 08/13/2020 PCP: [facility] [place]   Brief Narrative:  44 yo unvaccinated female admitted on 08/13/20 with COVID-19 pneumonia &ARDS requiring intubation on 9/17 and mechanical ventilation. While in ICU, attempts made to transfer to [facility] or [facility] for ECMO but no beds were available and it was not certain patient would be candidate. Proning protocols were followed while in ICU. Patient required maximal ventilation support with sedation and remained critically ill. She was weaned to FiO2 60% on 10/1. Tracheostomy was placed on 09/04/20. Patient began tolerating trach collar O2 in short durations 10/12-13 and began OOB to chair. Started working with PT on 10/15. Treated with 7 days Cefepime due to tracheal aspirate culture growing Pseudomonas. Hospitalist service assumed care of patient on 10/18.   Currently on cuffed size 6. Patient is still unable to pass enough air around trach yet to be able to use speech valvePer pulmonology and ENT, patient had very significant laryngeal edema, in addition to body habitus being a complicating factor with likely underlying sleep apnea and/or alveolar hypoventilation.  She is ambulating and CIR declined her, Likely progression to home health.  09/28/2020. ENT reevaluated her and there was some concern of persistent laryngeal edema/subglottis stenosis with prolonged intubation or granulation tissue formation. They are recommending to keep current trach and follow-up with them as an outpatient for laryngoscopy and further management.  Subjective: Patient was very disappointed that she cannot go home as we are still unable to find a home health agency for her needs.  She was asking for 2 visitors and wants to see her kids were age 60 and 35. Told charge nurse to see if we can get her to visitors and she should be able to see her kids.  Assessment & Plan:   Active  Problems:   Pneumonia due to COVID-19 virus   Acute renal failure (ARF) (HCC)   Elevated liver enzymes   CAP (community acquired pneumonia)   Acute encephalopathy   Hypokalemia   Acute respiratory failure ([place])   Tracheostomy in place [facility])  Acute hypoxic respiratory failure secondary to COVID-19 pneumonia and ARDS Morbid obesity, possible OSA, possible alveolar hypoventilation (OHS) S/p course of remdesevir, azithromycin &ceftriaxone.  Off airborne precautions. Tracheostomy placed 10/8.  Cefepime started 10/11 for suspected HCAP-tracheal aspriate +pseudomonas - course completed. Will need sleep study as outpatient  Tracheostomystatus Pt had severe laryngeal edema, per PCCM and ENT.  No air passing around downsized trach due to persistent edema most likely. Unable to use speech valve or cap trach yet.  follow up with ENT in 2 weeks (Dr. Tami Ribas) ENT reevaluated her on 09/28/20 and they recommend follow-up with Dr. Tami Ribas and no change in her current trach for concern of subglottis stenosis/granulation tissue formation and persistence of laryngeal edema.  Troponin elevation-due to demand ischemia Hypertrophic cardiomyopathy Was treated with lasix Echo revealed EF 45-50% with severe concentric LVH, diastolic dysfunction Currently denies chest pain, euvolemic on exam Close monitor volume status Recommend outpatient cardiology follow-up  Normocytic anemia, likely anemia of chronic disease -Appear chronic -Per chart review her hemoglobin range from 6.5 to 7.1 2017-2019 -she received total of 5 units PRBC transfusion this hospitalization -No overt sign of bleeding, she reports stool is brown, urine is clear, she does reports h/o heavy menses  -Iron panel in September was unremarkable -she declined further lab test including TSH, M76, folic acid, reticulocyte count , she prefers to have it  done by her pcp -FOBT pending collection  Per chart review she is supposed to take  thyroid supplement at home and iron supplement tsh ordered, but She does not want it done   Mild acute renal failure -resolved Hypokalemia -resolved Hypernatremia -resolved -All remained stable   Hyperglycemia likely related to tube feeds, she is now tolerating regular diet, no hyperglycemia  Body mass index is 36.32 kg/m.  Objective: Vitals:   09/30/20 0100 09/30/20 0516 09/30/20 0819 09/30/20 1237  BP: 128/80 116/86 109/77 102/65  Pulse: 89 94 87 81  Resp: 17 16 17 17   Temp: 98 F (36.7 C) 98.3 F (36.8 C) 98 F (36.7 C) 98 F (36.7 C)  TempSrc:   Oral Oral  SpO2: 100% 100% 99% 100%  Weight:      Height:        Intake/Output Summary (Last 24 hours) at 09/30/2020 1556 Last data filed at 09/30/2020 1431 Gross per 24 hour  Intake 480 ml  Output --  Net 480 ml   Filed Weights   09/25/20 0500 09/26/20 0500 09/27/20 0500  Weight: 112.5 kg 112.9 kg 112 kg    Examination:  General.  Well-developed lady.  Tracheostomy in place, in no acute distress. Pulmonary.  Lungs clear bilaterally, normal respiratory effort. CV.  Regular rate and rhythm, no JVD, rub or murmur. Abdomen.  Soft, nontender, nondistended, BS positive. CNS.  Alert and oriented x3.  No focal neurologic deficit. Extremities.  No edema, no cyanosis, pulses intact and symmetrical. Psychiatry.  Judgment and insight appears normal.  DVT prophylaxis: SCDs Code Status: Full Family Communication: Discussed with patient. Disposition Plan:  Status is: Inpatient  Remains inpatient appropriate because:Inpatient level of care appropriate due to severity of illness   Dispo: The patient is from: Home              Anticipated d/c is to: Home              Anticipated d/c date is: 1 day              Patient currently is medically stable.  Unable to find home health care due to her insurance.  TOC is working on it.  Insurance already declined CIR and LTAC.  Unsafe disposition at this time.  Consultants:    PCCM  ENT  CIR  Procedures:  Antimicrobials:   Data Reviewed: I have personally reviewed following labs and imaging studies  CBC: Recent Labs  Lab 09/24/20 0432  WBC 4.5  HGB 9.7*  HCT 30.6*  MCV 80.5  PLT 638   Basic Metabolic Panel: Recent Labs  Lab 09/24/20 0432  NA 136  K 3.7  CL 100  CO2 25  GLUCOSE 96  BUN 12  CREATININE 0.69  CALCIUM 9.6   GFR: Estimated Creatinine Clearance: 118.7 mL/min (by C-G formula based on SCr of 0.69 mg/dL). Liver Function Tests: No results for input(s): AST, ALT, ALKPHOS, BILITOT, PROT, ALBUMIN in the last 168 hours. No results for input(s): LIPASE, AMYLASE in the last 168 hours. No results for input(s): AMMONIA in the last 168 hours. Coagulation Profile: No results for input(s): INR, PROTIME in the last 168 hours. Cardiac Enzymes: No results for input(s): CKTOTAL, CKMB, CKMBINDEX, TROPONINI in the last 168 hours. BNP (last 3 results) No results for input(s): PROBNP in the last 8760 hours. HbA1C: No results for input(s): HGBA1C in the last 72 hours. CBG: No results for input(s): GLUCAP in the last 168 hours. Lipid Profile: No results  for input(s): CHOL, HDL, LDLCALC, TRIG, CHOLHDL, LDLDIRECT in the last 72 hours. Thyroid Function Tests: No results for input(s): TSH, T4TOTAL, FREET4, T3FREE, THYROIDAB in the last 72 hours. Anemia Panel: No results for input(s): VITAMINB12, FOLATE, FERRITIN, TIBC, IRON, RETICCTPCT in the last 72 hours. Sepsis Labs: No results for input(s): PROCALCITON, LATICACIDVEN in the last 168 hours.  Recent Results (from the past 240 hour(s))  MRSA PCR Screening     Status: None   Collection Time: 09/22/20 12:09 PM   Specimen: Nasopharyngeal  Result Value Ref Range Status   MRSA by PCR NEGATIVE NEGATIVE Final    Comment:        The GeneXpert MRSA Assay (FDA approved for NASAL specimens only), is one component of a comprehensive MRSA colonization surveillance program. It is not intended to  diagnose MRSA infection nor to guide or monitor treatment for MRSA infections. Performed at [facility], [address], [place], [place] [address]      Radiology Studies: No results found.  Scheduled Meds: . enoxaparin (LOVENOX) injection  0.5 mg/kg Subcutaneous Q24H  . mouth rinse  15 mL Mouth Rinse q12n4p  . nystatin  5 mL Oral QID  . pantoprazole  40 mg Oral BID AC  . Ensure Max Protein  11 oz Oral BID BM   Continuous Infusions: . sodium chloride Stopped (09/07/20 0725)     LOS: 48 days   Time spent: 20 minutes.  Lorella Nimrod, MD [facility]  If 7PM-7AM, please contact night-coverage [email or web address]  09/30/2020, 3:56 PM   This record has been created using Systems analyst. Errors have been sought and corrected,but may not always be located. Such creation errors do not reflect on the standard of care.

## 2020-09-30 NOTE — Progress Notes (Signed)
PT Cancellation Note  Patient Details Name: SHAMONIQUE BATTISTE MRN: [id number] DOB: 03-Sep-1976   Cancelled Treatment:    Reason Eval/Treat Not Completed: Other (comment).  Pt on bedside commode upon PT arrival (pt not available for PT session).  Will re-attempt PT treatment session at a later date/time as able.  Leitha Bleak, PT 09/30/20, 9:46 AM

## 2020-09-30 NOTE — TOC Progression Note (Signed)
Transition of Care [facility]) - Progression Note    Patient Details  Name: Kathy Vega MRN: [id number] Date of Birth: [birth year]  [facility]) CM/SW Contact  Shelbie Hutching, RN Phone Number: 09/30/2020, 2:55 PM  Clinical Narrative:    Followed up with patient's mother [name], informed [name] that either she and the patient or the patient's husband and the patient together need to contact [facility] and ask to be set up with a case manager.  RNCM called [facility] the other day but they would not let this RNCM request case manager, patient must do it. [name] says that she will pass this information over to her husband and they can call tomorrow when he comes to visit.    Expected Discharge Plan: [place] Barriers to Discharge: No [place] will accept this patient  Expected Discharge Plan and Services Expected Discharge Plan: [place]   Discharge Planning Services: CM Consult Post Acute Care Choice: [place] arrangements for the past 2 months: Single Family Home                 DME Arranged: 3-N-1, Walker rolling, Trach supplies, Oxygen DME Agency: [employer] Date DME Agency Contacted: 09/25/20 Time DME Agency Contacted: 0630 Representative spoke with at DME Agency: [place] ([place]) Interventions    Readmission Risk Interventions Readmission Risk Prevention Plan 09/10/2020  Transportation Screening Complete  PCP or Specialist Appt within 3-5 Days Complete  HRI or [place] Complete  Social Work Consult for [place] Planning/Counseling Complete  Palliative Care Screening Complete  Medication Review Press photographer) Complete  Some recent data might be hidden

## 2020-09-30 NOTE — Progress Notes (Signed)
Physical Therapy Treatment Patient Details Name: Kathy Vega MRN: [id number] DOB: 15-Oct-1976 Today's Date: 09/30/2020    History of Present Illness Pt is a 44 y.o. female presenting to hospital 9/16 with SOB and confusion; initial O2 sats in 20's; pt COVID (+) 6 days prior.  Mechanically intubated 9/17.  S/p trach 10/8.  Pt admitted with acute hypoxic respiratory failure secondary COVID-19 PNA and ARDS, possible OSA with upper airway swelling s/p trach, and severe acute hypoxic and hypercapnic respiratory failure.  PMH includes anemia, thyroid disease, h/o leg surgery.    PT Comments    Pt resting in bed upon PT arrival; hopeful for discharge home soon.  Pt with many questions requiring extra time during session to discuss (discussed via pt writing on white board): questions including home O2/trach set-up, how to manage/set-up O2 tank for walking, dressing (shirt) with trach, bathing/washing hair with trach, how long portable O2 tank would last, etc.  Therapist had respiratory called to come answer pt's respiratory related questions during session (regular size O2 tank on 3 L/min would last 4 hours and 15 minutes).  Educated pt on O2 safety (pt reports that she does NOT have a gas stove and her significant other only smokes outside).  Per discussion with respiratory therapist, trialed pt on room air with ambulation (at rest pt on 5 L O2 with FiO2 28% on wall unit for humidifying purposes).  Pt able to ambulate 100 feet with O2 sats 90% or greater; O2 sats then decreased to 88-89% next 40 feet; and O2 sats then decreased to 87% after another 50 feet of ambulation.  O2 sats increased to 95% on room air within a minute of sitting rest break.  Pt then placed on 3 L O2 with FiO2 28% (per discussion with respiratory therapist) for ambulation (500 feet total) and O2 sats 97% or greater (pt able to navigate O2 tank during ambulation on own after initial cueing).  After cueing and practice, pt able to manage  trach collar/O2 set-up for ambulation.  Pt reports no further questions today.  SaO2 on room air at rest = 99% SaO2 on room air while ambulating = 87% SaO2 on 3 liters of O2 (FiO2 28%) while ambulating = 97%   Follow Up Recommendations  Supervision/Assistance - 24 hour;Supervision for mobility/OOB;Home health PT     Equipment Recommendations  None recommended by PT    Recommendations for Other Services OT consult     Precautions / Restrictions Precautions Precautions: Fall Precaution Comments: Trach (trach collar); HOB >30 degrees Restrictions Weight Bearing Restrictions: No    Mobility  Bed Mobility Overal bed mobility: Modified Independent Bed Mobility: Supine to Sit     Supine to sit: Modified independent (Device/Increase time);HOB elevated     General bed mobility comments: no difficulties noted  Transfers Overall transfer level: Modified independent Equipment used: None Transfers: Sit to/from Stand Sit to Stand: Modified independent (Device/Increase time)         General transfer comment: x2 trials from bed; steady and safe  Ambulation/Gait Ambulation/Gait assistance: Supervision Gait Distance (Feet):  (180 feet RA; 500 feet with supplemental O2) Assistive device:  (pushing O2 tank) Gait Pattern/deviations: Step-through pattern;Decreased step length - right;Decreased step length - left Gait velocity: decreased   General Gait Details: steady with ambulation   Stairs             Wheelchair Mobility    Modified Rankin (Stroke Patients Only)       Balance Overall balance assessment:  Modified Independent Sitting-balance support: No upper extremity supported;Feet supported Sitting balance-Leahy Scale: Normal Sitting balance - Comments: steady sitting reaching outside BOS   Standing balance support: No upper extremity supported Standing balance-Leahy Scale: Good Standing balance comment: steady standing reaching within BOS                             Cognition Arousal/Alertness: Awake/alert Behavior During Therapy: WFL for tasks assessed/performed Overall Cognitive Status: Within Functional Limits for tasks assessed                                 General Comments: A&O x4      Exercises      General Comments  Pt agreeable to PT session.      Pertinent Vitals/Pain Pain Assessment: No/denies pain Pain Score: 0-No pain Pain Intervention(s): Limited activity within patient's tolerance;Monitored during session;Repositioned  HR 100-120 bpm during sessions activities.    Home Living                      Prior Function            PT Goals (current goals can now be found in the care plan section) Acute Rehab PT Goals Patient Stated Goal: go home PT Goal Formulation: With patient Time For Goal Achievement: 10/05/20 Potential to Achieve Goals: Good Progress towards PT goals: Progressing toward goals    Frequency    7X/week      PT Plan Current plan remains appropriate    Co-evaluation              AM-PAC PT "6 Clicks" Mobility   Outcome Measure  Help needed turning from your back to your side while in a flat bed without using bedrails?: None Help needed moving from lying on your back to sitting on the side of a flat bed without using bedrails?: None Help needed moving to and from a bed to a chair (including a wheelchair)?: None Help needed standing up from a chair using your arms (e.g., wheelchair or bedside chair)?: None Help needed to walk in hospital room?: A Little Help needed climbing 3-5 steps with a railing? : A Little 6 Click Score: 22    End of Session Equipment Utilized During Treatment: Gait belt;Oxygen Activity Tolerance: Patient tolerated treatment well Patient left: with call bell/phone within reach (sitting on edge of bed) Nurse Communication: Mobility status;Precautions PT Visit Diagnosis: Other abnormalities of gait and mobility  (R26.89);Muscle weakness (generalized) (M62.81);Difficulty in walking, not elsewhere classified (R26.2)     Time: 8563-1497 PT Time Calculation (min) (ACUTE ONLY): 80 min  Charges:  $Gait Training: 8-22 mins $Therapeutic Exercise: 8-22 mins $Therapeutic Activity: 23-37 mins $Self Care/Home Management: 8-22                    Leitha Bleak, PT 09/30/20, 4:40 PM

## 2020-10-01 DIAGNOSIS — J9601 Acute respiratory failure with hypoxia: Secondary | ICD-10-CM | POA: Diagnosis not present

## 2020-10-01 NOTE — Progress Notes (Signed)
RT to patient bedside for [employer] with patient and family. Education provided to patient, husband and two sons at bedside. All questions answered at this time.

## 2020-10-01 NOTE — TOC Progression Note (Signed)
Transition of Care [facility]) - Progression Note    Patient Details  Name: Kathy Vega MRN: [id number] Date of Birth: 18-Oct-1976  Transition of Care [facility]) CM/SW Contact  Shelbie Hutching, RN Phone Number: 10/01/2020, 9:50 AM  Clinical Narrative:    [facility] team still working on finding home health services for patient.  [facility] is not accepting any outside referrals, [facility] does not work with trachs, [facility] does not work with trachs, [facility] is unable to accept, [facility] is unable to accept, [facility] does not have staffing to accept. Message left with [facility] and referral faxed to [place] home [facility].    Expected Discharge Plan: [place] Barriers to Discharge: No [place] will accept this patient  Expected Discharge Plan and Services Expected Discharge Plan: [place]   Discharge Planning Services: CM Consult Post Acute Care Choice: [place] arrangements for the past 2 months: Single Family Home                 DME Arranged: 3-N-1, Walker rolling, Trach supplies, Oxygen DME Agency: [employer] Date DME Agency Contacted: 09/25/20 Time DME Agency Contacted: [phone] Representative spoke with at DME Agency: [place] ([place]) Interventions    Readmission Risk Interventions Readmission Risk Prevention Plan 09/10/2020  Transportation Screening Complete  PCP or Specialist Appt within 3-5 Days Complete  HRI or [place] Complete  Social Work Consult for [place] Planning/Counseling Complete  Palliative Care Screening Complete  Medication Review Press photographer) Complete  Some recent data might be hidden

## 2020-10-01 NOTE — Progress Notes (Signed)
PROGRESS NOTE    Kathy Vega  JXB:[id number] DOB: 10-29-1976 DOA: 08/13/2020 PCP: [facility] [place]   Brief Narrative:  44 yo unvaccinated female admitted on 08/13/20 with COVID-19 pneumonia &ARDS requiring intubation on 9/17 and mechanical ventilation. While in ICU, attempts made to transfer to [facility] or [facility] for ECMO but no beds were available and it was not certain patient would be candidate. Proning protocols were followed while in ICU. Patient required maximal ventilation support with sedation and remained critically ill. She was weaned to FiO2 60% on 10/1. Tracheostomy was placed on 09/04/20. Patient began tolerating trach collar O2 in short durations 10/12-13 and began OOB to chair. Started working with PT on 10/15. Treated with 7 days Cefepime due to tracheal aspirate culture growing Pseudomonas. Hospitalist service assumed care of patient on 10/18.   Currently on cuffed size 6. Patient is still unable to pass enough air around trach yet to be able to use speech valvePer pulmonology and ENT, patient had very significant laryngeal edema, in addition to body habitus being a complicating factor with likely underlying sleep apnea and/or alveolar hypoventilation.  She is ambulating and CIR declined her, Likely progression to home health.  09/28/2020. ENT reevaluated her and there was some concern of persistent laryngeal edema/subglottis stenosis with prolonged intubation or granulation tissue formation. They are recommending to keep current trach and follow-up with them as an outpatient for laryngoscopy and further management.  10/01/20.  Able to find a home health agency who can help her, they will need a family member to get training for trach care.  Possible discharge tomorrow.  Subjective: Patient has no new complaint today.  Apparently did not get 2 visitors yesterday despite telling the charge nurse.  Discussed with charge nurse and the secondary again today  that she can get 2 visitors and her kids also visit her.  Assessment & Plan:   Active Problems:   Pneumonia due to COVID-19 virus   Acute renal failure (ARF) (HCC)   Elevated liver enzymes   CAP (community acquired pneumonia)   Acute encephalopathy   Hypokalemia   Acute respiratory failure ([place])   Tracheostomy in place [facility])  Acute hypoxic respiratory failure secondary to COVID-19 pneumonia and ARDS Morbid obesity, possible OSA, possible alveolar hypoventilation (OHS) S/p course of remdesevir, azithromycin &ceftriaxone.  Off airborne precautions. Tracheostomy placed 10/8.  Cefepime started 10/11 for suspected HCAP-tracheal aspriate +pseudomonas - course completed. Will need sleep study as outpatient  Tracheostomystatus Pt had severe laryngeal edema, per PCCM and ENT.  No air passing around downsized trach due to persistent edema most likely. Unable to use speech valve or cap trach yet.  follow up with ENT in 2 weeks (Dr. Tami Ribas) ENT reevaluated her on 09/28/20 and they recommend follow-up with Dr. Tami Ribas and no change in her current trach for concern of subglottis stenosis/granulation tissue formation and persistence of laryngeal edema.  Troponin elevation-due to demand ischemia Hypertrophic cardiomyopathy Was treated with lasix Echo revealed EF 45-50% with severe concentric LVH, diastolic dysfunction Currently denies chest pain, euvolemic on exam Close monitor volume status Recommend outpatient cardiology follow-up  Normocytic anemia, likely anemia of chronic disease -Appear chronic -Per chart review her hemoglobin range from 6.5 to 7.1 2017-2019 -she received total of 5 units PRBC transfusion this hospitalization -No overt sign of bleeding, she reports stool is brown, urine is clear, she does reports h/o heavy menses  -Iron panel in September was unremarkable -she declined further lab test including TSH, Z30, folic  acid, reticulocyte count , she prefers to have it  done by her pcp -FOBT pending collection  Per chart review she is supposed to take thyroid supplement at home and iron supplement tsh ordered, but She does not want it done   Mild acute renal failure -resolved Hypokalemia -resolved Hypernatremia -resolved -All remained stable   Hyperglycemia likely related to tube feeds, she is now tolerating regular diet, no hyperglycemia  Body mass index is 36.32 kg/m.  Objective: Vitals:   10/01/20 0425 10/01/20 0426 10/01/20 0828 10/01/20 1158  BP: 114/75  108/71 (!) 140/108  Pulse: 84  78 97  Resp: 20  16 16   Temp: 98.9 F (37.2 C)  97.6 F (36.4 C) 97.7 F (36.5 C)  TempSrc:    Oral  SpO2: 100%  100%   Weight:  114.1 kg    Height:        Intake/Output Summary (Last 24 hours) at 10/01/2020 1333 Last data filed at 10/01/2020 0900 Gross per 24 hour  Intake 680 ml  Output --  Net 680 ml   Filed Weights   09/26/20 0500 09/27/20 0500 10/01/20 0426  Weight: 112.9 kg 112 kg 114.1 kg    Examination:  General.  Well-developed lady.  Tracheostomy in place, in no acute distress. Pulmonary.  Lungs clear bilaterally, normal respiratory effort. CV.  Regular rate and rhythm, no JVD, rub or murmur. Abdomen.  Soft, nontender, nondistended, BS positive. CNS.  Alert and oriented x3.  No focal neurologic deficit. Extremities.  No edema, no cyanosis, pulses intact and symmetrical. Psychiatry.  Judgment and insight appears normal.  DVT prophylaxis: SCDs Code Status: Full Family Communication: Discussed with patient. Disposition Plan:  Status is: Inpatient  Remains inpatient appropriate because:Inpatient level of care appropriate due to severity of illness   Dispo: The patient is from: Home              Anticipated d/c is to: Home              Anticipated d/c date is: 1 day              Patient currently is medically stable.  Able to find 1 home health agency who accepted her care, they want a family member to be trained for trach  care which will be done later today.  Possible discharge tomorrow morning.  Consultants:   PCCM  ENT  CIR  Procedures:  Antimicrobials:   Data Reviewed: I have personally reviewed following labs and imaging studies  CBC: No results for input(s): WBC, NEUTROABS, HGB, HCT, MCV, PLT in the last 168 hours. Basic Metabolic Panel: No results for input(s): NA, K, CL, CO2, GLUCOSE, BUN, CREATININE, CALCIUM, MG, PHOS in the last 168 hours. GFR: Estimated Creatinine Clearance: 119.9 mL/min (by C-G formula based on SCr of 0.69 mg/dL). Liver Function Tests: No results for input(s): AST, ALT, ALKPHOS, BILITOT, PROT, ALBUMIN in the last 168 hours. No results for input(s): LIPASE, AMYLASE in the last 168 hours. No results for input(s): AMMONIA in the last 168 hours. Coagulation Profile: No results for input(s): INR, PROTIME in the last 168 hours. Cardiac Enzymes: No results for input(s): CKTOTAL, CKMB, CKMBINDEX, TROPONINI in the last 168 hours. BNP (last 3 results) No results for input(s): PROBNP in the last 8760 hours. HbA1C: No results for input(s): HGBA1C in the last 72 hours. CBG: No results for input(s): GLUCAP in the last 168 hours. Lipid Profile: No results for input(s): CHOL, HDL, LDLCALC, TRIG, CHOLHDL, LDLDIRECT in  the last 72 hours. Thyroid Function Tests: No results for input(s): TSH, T4TOTAL, FREET4, T3FREE, THYROIDAB in the last 72 hours. Anemia Panel: No results for input(s): VITAMINB12, FOLATE, FERRITIN, TIBC, IRON, RETICCTPCT in the last 72 hours. Sepsis Labs: No results for input(s): PROCALCITON, LATICACIDVEN in the last 168 hours.  Recent Results (from the past 240 hour(s))  MRSA PCR Screening     Status: None   Collection Time: 09/22/20 12:09 PM   Specimen: Nasopharyngeal  Result Value Ref Range Status   MRSA by PCR NEGATIVE NEGATIVE Final    Comment:        The GeneXpert MRSA Assay (FDA approved for NASAL specimens only), is one component of  a comprehensive MRSA colonization surveillance program. It is not intended to diagnose MRSA infection nor to guide or monitor treatment for MRSA infections. Performed at [facility], [address] [place], [place] [address]      Radiology Studies: No results found.  Scheduled Meds: . enoxaparin (LOVENOX) injection  0.5 mg/kg Subcutaneous Q24H  . mouth rinse  15 mL Mouth Rinse q12n4p  . nystatin  5 mL Oral QID  . pantoprazole  40 mg Oral BID AC  . Ensure Max Protein  11 oz Oral BID BM   Continuous Infusions: . sodium chloride Stopped (09/07/20 0725)     LOS: 49 days   Time spent: 20 minutes.  Lorella Nimrod, MD [facility]  If 7PM-7AM, please contact night-coverage [email or web address]  10/01/2020, 1:33 PM   This record has been created using Systems analyst. Errors have been sought and corrected,but may not always be located. Such creation errors do not reflect on the standard of care.

## 2020-10-01 NOTE — Progress Notes (Signed)
Physical Therapy Treatment Patient Details Name: Kathy Vega MRN: [id number] DOB: September 23, 1976 Today's Date: 10/01/2020    History of Present Illness Pt is a 44 y.o. female presenting to hospital 9/16 with SOB and confusion; initial O2 sats in 20's; pt COVID (+) 6 days prior.  Mechanically intubated 9/17.  S/p trach 10/8.  Pt admitted with acute hypoxic respiratory failure secondary COVID-19 PNA and ARDS, possible OSA with upper airway swelling s/p trach, and severe acute hypoxic and hypercapnic respiratory failure.  PMH includes anemia, thyroid disease, h/o leg surgery.    PT Comments    Ready for session. Obtained a second trach cuff from respiratory so pt could practice removing whole unit instead of disconnecting tubing from cuff.  She is able to don/doff on her own.  She elects at end of session to keep new cuff and throw old one away as it is soiled from secretions.  She is able to walk 1000'+ pushing O2 tank and managing tubing along with navigating 8 steps.  Overall tolerates well with Sats 99-100% on 3 lpm.     Follow Up Recommendations  Supervision/Assistance - 24 hour;Supervision for mobility/OOB;Home health PT     Equipment Recommendations  None recommended by PT    Recommendations for Other Services       Precautions / Restrictions Precautions Precautions: Fall Precaution Comments: Trach (trach collar); HOB >30 degrees    Mobility  Bed Mobility Overal bed mobility: Modified Independent                Transfers Overall transfer level: Modified independent                  Ambulation/Gait   Gait Distance (Feet): 1000 Feet Assistive device: None Gait Pattern/deviations: Step-through pattern;Decreased step length - right;Decreased step length - left Gait velocity: decreased   General Gait Details: steady with ambulation pushing O2 tank   Stairs Stairs: Yes Stairs assistance: Supervision Stair Management: Two rails;Step to pattern Number of  Stairs: 8 General stair comments: with ease   Wheelchair Mobility    Modified Rankin (Stroke Patients Only)       Balance Overall balance assessment: Modified Independent Sitting-balance support: No upper extremity supported;Feet supported Sitting balance-Leahy Scale: Normal Sitting balance - Comments: steady sitting reaching outside BOS   Standing balance support: No upper extremity supported Standing balance-Leahy Scale: Good Standing balance comment: steady standing reaching within BOS                            Cognition Arousal/Alertness: Awake/alert Behavior During Therapy: WFL for tasks assessed/performed Overall Cognitive Status: Within Functional Limits for tasks assessed                                 General Comments: A&O x4      Exercises      General Comments        Pertinent Vitals/Pain Pain Assessment: No/denies pain    Home Living                      Prior Function            PT Goals (current goals can now be found in the care plan section) Progress towards PT goals: Progressing toward goals    Frequency    7X/week      PT Plan Current plan remains appropriate  Co-evaluation              AM-PAC PT "6 Clicks" Mobility   Outcome Measure  Help needed turning from your back to your side while in a flat bed without using bedrails?: None Help needed moving from lying on your back to sitting on the side of a flat bed without using bedrails?: None Help needed moving to and from a bed to a chair (including a wheelchair)?: None Help needed standing up from a chair using your arms (e.g., wheelchair or bedside chair)?: None Help needed to walk in hospital room?: A Little Help needed climbing 3-5 steps with a railing? : A Little 6 Click Score: 22    End of Session Equipment Utilized During Treatment: Gait belt;Oxygen Activity Tolerance: Patient tolerated treatment well Patient left: with call  bell/phone within reach (sitting on edge of bed) Nurse Communication: Mobility status;Precautions PT Visit Diagnosis: Other abnormalities of gait and mobility (R26.89);Muscle weakness (generalized) (M62.81);Difficulty in walking, not elsewhere classified (R26.2)     Time: 9371-6967 PT Time Calculation (min) (ACUTE ONLY): 45 min  Charges:  $Gait Training: 38-52 mins                    Chesley Noon, PTA 10/01/20, 4:34 PM

## 2020-10-01 NOTE — TOC Progression Note (Signed)
Transition of Care [facility]) - Progression Note    Patient Details  Name: Kathy Vega MRN: [id number] Date of Birth: 06-12-1976  Transition of Care [facility]) CM/SW Contact  Shelbie Hutching, RN Phone Number: 10/01/2020, 1:16 PM  Clinical Narrative:    [place] has agreed to accept home health order with the condition that family gets bedside education here in the hospital and that respiratory with [employer] go out and see patient first.  TOC continuing to work on getting services lined up.    Expected Discharge Plan: [place] Barriers to Discharge: No [place] will accept this patient  Expected Discharge Plan and Services Expected Discharge Plan: [place]   Discharge Planning Services: CM Consult Post Acute Care Choice: [place] arrangements for the past 2 months: Single Family Home                 DME Arranged: 3-N-1, Walker rolling, Trach supplies, Oxygen DME Agency: [employer] Date DME Agency Contacted: 09/25/20 Time DME Agency Contacted: [phone] Representative spoke with at DME Agency: Andree Coss HH Arranged: RN, PT, OT, Nurse's Aide, Speech Therapy [place] Agency: [place] ([place]) Date [place]: 10/01/20 Time [place]: 1316 Representative spoke with at [place]: [place] (SDOH) Interventions    Readmission Risk Interventions Readmission Risk Prevention Plan 09/10/2020  Transportation Screening Complete  PCP or Specialist Appt within 3-5 Days Complete  HRI or [place] Complete  Social Work Consult for [place] Planning/Counseling Complete  Palliative Care Screening Complete  Medication Review Press photographer) Complete  Some recent data might be hidden

## 2020-10-02 DIAGNOSIS — U071 COVID-19: Secondary | ICD-10-CM | POA: Diagnosis not present

## 2020-10-02 DIAGNOSIS — R52 Pain, unspecified: Secondary | ICD-10-CM

## 2020-10-02 DIAGNOSIS — J9601 Acute respiratory failure with hypoxia: Secondary | ICD-10-CM | POA: Diagnosis not present

## 2020-10-02 DIAGNOSIS — Z789 Other specified health status: Secondary | ICD-10-CM

## 2020-10-02 DIAGNOSIS — R748 Abnormal levels of other serum enzymes: Secondary | ICD-10-CM | POA: Diagnosis not present

## 2020-10-02 MED ORDER — THERAPEUTIC MULTIVIT/MINERAL PO TABS: 1.0000 | ORAL_TABLET | Freq: Every day | ORAL | 2 refills | Status: AC

## 2020-10-02 MED ORDER — ZINC 220 (50 ZN) MG PO CAPS: 1.0000 | ORAL_CAPSULE | Freq: Every day | ORAL | 0 refills | Status: DC

## 2020-10-02 NOTE — Discharge Instructions (Signed)
10 Things You Can Do to Manage Your COVID-19 Symptoms at Home If you have possible or confirmed COVID-19: 1. Stay home from work and school. And stay away from other public places. If you must go out, avoid using any kind of public transportation, ridesharing, or taxis. 2. Monitor your symptoms carefully. If your symptoms get worse, call your healthcare provider immediately. 3. Get rest and stay hydrated. 4. If you have a medical appointment, call the healthcare provider ahead of time and tell them that you have or may have COVID-19. 5. For medical emergencies, call 911 and notify the dispatch personnel that you have or may have COVID-19. 6. Cover your cough and sneezes with a tissue or use the inside of your elbow. 7. Wash your hands often with soap and water for at least 20 seconds or clean your hands with an alcohol-based hand sanitizer that contains at least 60% alcohol. 8. As much as possible, stay in a specific room and away from other people in your home. Also, you should use a separate bathroom, if available. If you need to be around other people in or outside of the home, wear a mask. 9. Avoid sharing personal items with other people in your household, like dishes, towels, and bedding. 10. Clean all surfaces that are touched often, like counters, tabletops, and doorknobs. Use household cleaning sprays or wipes according to the label instructions. cdc.gov/coronavirus 05/29/2019 This information is not intended to replace advice given to you by your health care provider. Make sure you discuss any questions you have with your health care provider. Document Revised: 10/31/2019 Document Reviewed: 10/31/2019 Elsevier Patient Education  2020 Elsevier Inc.  

## 2020-10-02 NOTE — Progress Notes (Signed)
Speech Language Pathology Treatment: Nada Boozer Speaking valve  Patient Details Name: Kathy Vega MRN: [id number] DOB: 11-19-1976 Today's Date: 10/02/2020 Time: 9983-3825 SLP Time Calculation (min) (ACUTE ONLY): 45 min  Assessment / Plan / Recommendation Clinical Impression  Pt seen today forongoingPMVassessment -- assessment of pt's ability to redirect air superiorly for PMV use/wear. P tis A/O x4 and using writing and/or mouthing and gestures for communication w/ others.Pt is tolerating her Regular consistency diet w/ Thin liquids -- No decline in pulmonary status or overt, clinical s/s of aspiration noted by NSG/MD.Recommendcontinue current diet w/ gravies added to moisten foods, thin liquids w/ general aspiration precautions; Pills in Pureefor ease of swallowing/clearing as she desires;positioning upright for eating/drinking meals. Pt likes for the tougher meats/foods to be cut for ease as her Baseline.  Pt's Tracheostomy was placed on 09/04/2020.Reviewed/discussed w/ ptthe PMV, its use for communication, and its care again. Discussed the change in air flow w/ a tracheostomy and purpose of the PMV for verbal communication as well as its role in swallowing. Pt has a Shiley #6XL,cuffed; cuff DEFLATED, pt on trach collar at Baseline. Finger occlusion was attempted initiallyand coughing encouraged to clear min+ mucous/phlegm(tracheal).Discussed the importance of coughing own secretions to clear and suction w/ yaunkeur vs being frequently suctioned by NSG(tracheal suctioning) d/t the irritation and increase in mucous.Slight/minredirected, superior airflow noted. Pt stated she noted increased ability to "push" air "through" and "talk a little" w/out the PMV -- educated her on the importance of NOT straining to talk in that manner but to wait until airflow can redirect superiorly from the lungs through the mouth and she is able to use finger occlusion and/or PMV. Upon placing the PMV,  ptagainwas only able to tolerate ~1 minw/out some discomfort and air stacking noted. Upon removing the PMV, a slight rush of airflow was noted at the level of the trachthough less than previous sessions. This was attempted again w/ same results. While wearing the PMV for the ~1 min, pt was able to attain the strained vocal quality c/b a gravely/whispery quality; there was apparent strain. Pt stated she felt she was able to just wear the PMV and breath "better" during a ~1 min trial w/out talking; when it became too uncomfortable though, the PMV was removed, cleaned. Pt endorsed discomfort in breathing; unable to exhale sufficiently.RR remained ather baseline during/post removal of PMVw/ no increased in effort/WOB.  Discussion was had w/ ENT who assessed one afternoon who stated: "Patient likely still has some laryngeal edema or subglottic blockage. 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 She is nearing discharge and should go home with current trach tube. She will need flexible laryngoscopy to make sure the larynx swelling is down but may also need direct laryngoscopy for removal of granulation tissue or addressing subglottic stenosis. She has been scheduled to see Dr. Tami Ribas in the office for follow-up of this. I would not try changing the trach tube prior to her evaluation.".  Pt is able to redirect slight/min amount of airflow today which is an improvement fromprevious daysthough not sufficient or appropriate to wear the PMV adequately, or comfortably. Pt'simproved medical status overallbodeswell for improvement for toleration of PMV placement/wear. Thrush txcompleted. Discussed frequent oral care practice. Education on trach and air movement around for PMV use; need for f/u w/ ENT for ongoing assessment and direct viewing of trachea w/ discussion about downsizing or shortening the trach to see if it can  make any  difference in superior airflow. Encouraged pt to avoid straining verbalizations until able to wear the PMV comfortably for verbal communication. ST services recommended POST f/u w/ ENT and ENT recommendations for monitoring of appropriateness for PMV use/wear BUT STRONGLY RECOMMEND ENT F/U FIRST d/t pt's inability to wear the PMV currently. Pt continues to use augmentative communication strategies well for her needs. CM/NSG updated. Pt agreed.       HPI HPI: (08/13/2020) Pt is a 44 yo female with a PMH of Hypothyroidism, morbid Obesity, and Anemia.  She presented to [facility] ER on 09/16 with c/o shortness of breath, headache, and confusion.  Upon arrival to the ER pts O2 sats were in the 20's she was placed on 5L of O2 via nasal canula, however her O2 sats only increased to 29%.  She was diagnosed with COVID-19 6 days prior to ER presentation, and she is unvaccinated.  She was placed on NRB and heated HFNC with increase in O2 sats to 93-95%.  Pt also noted to be confused and uncooperative likely secondary to taking a significant amount of cough syrup with codeine at home.  CT Head negative.  CXR revealed bilateral infiltrates.  PCCM team contacted for ICU admission.  She was orally intubated 08/14/2020 w/ OGT; traceheostomy 09/04/2020 w/ Dobhoff present for TFs support.  Pt has weaned to [facility] trials during the day w/ vent support at night as needed.       SLP Plan  New goals to be determined pending instrumental study;Consult other service (comment) (w/ ENT)       Recommendations         Patient may use Passy-Muir Speech Valve: with SLP only PMSV Supervision: Full MD: Please consider changing trach tube to : Smaller size (shorter trach)         General recommendations:  (ENT f/u) Oral Care Recommendations: Oral care BID;Patient independent with oral care Follow up Recommendations: Home health SLP (when appropriate per ENT f/u) SLP Visit Diagnosis: Aphonia (R49.1) (sec to  tracheostomy) Plan: New goals to be determined pending instrumental study;Consult other service (comment) (w/ ENT)       GO                 Orinda Kenner, [place], CCC-SLP Speech Language Pathologist Rehab Services [phone] [facility] 10/02/2020, 11:13 AM

## 2020-10-02 NOTE — Discharge Summary (Addendum)
Physician Discharge Summary  Kathy Vega KXF:[id number] DOB: 01/06/1976 DOA: 08/13/2020  PCP: [facility] [place] date: 08/13/2020 Discharge date: 10/02/2020  Admitted From: Home Disposition: Home   Recommendations for Outpatient Follow-up:  1. Follow up with PCP in 1-2 weeks 2. Follow-up with ENT in 1 week 3. Please obtain BMP/CBC in one week 4. Please follow up on the following pending results:None  Home Health: Yes Equipment/Devices: Home oxygen, tracheostomy supplies Discharge Condition: Stable CODE STATUS: Full Diet recommendation: Heart Healthy   Brief/Interim Summary: 44 yo unvaccinated female admitted on 08/13/20 with COVID-19 pneumonia &ARDS requiring intubation on 9/17 and mechanical ventilation. While in ICU, attempts made to transfer to [facility] or [facility] for ECMO but no beds were available and it was not certain patient would be candidate. Proning protocols were followed while in ICU. Patient required maximal ventilation support with sedation and remained critically ill. She was weaned to FiO2 60% on 10/1. Tracheostomy was placed on 09/04/20. Patient began tolerating trach collar O2 in short durations 10/12-13 and began OOB to chair. Started working with PT on 10/15. Treated with 7 days Cefepime due to tracheal aspirate culture growing Pseudomonas. Hospitalist service assumed care of patient on 10/18.  Patient initially met sepsis criteria secondary to COVID-19 pneumonia. Sepsis resolved before discharge.  Currently on cuffed size 6. Patient is still unable to pass enough air around trach yet to be able to use speech valvePer pulmonology and ENT, patient had very significant laryngeal edema, in addition to body habitus being a complicating factor with likely underlying sleep apnea and/or alveolar hypoventilation.  She is ambulating and CIR declined her, Likely progression to home health.  09/28/2020. ENT reevaluated her and there was some concern  of persistent laryngeal edema/subglottis stenosis with prolonged intubation or granulation tissue formation. They are recommending to keep current trach and follow-up with them as an outpatient for laryngoscopy and further management.  Patient had prolonged hospitalization as after stabilization we will continue to struggle finding a home health care and see who can help her at home and provide trach care.  Eventually able to find it on 10/01/2020.  Her family was educated for trach care and she was discharged next day.  Patient will follow up with ENT within a week for trial of air leak or a possible laryngoscopy to see if there is any stenosis or web development.  Patient had multiple electrolyte derangements due to severity of her illness during current hospitalization which were resolved before discharge.  She will continue with her supplements and follow-up with her providers.  Discharge Diagnoses:  Active Problems:   Pneumonia due to COVID-19 virus   Acute renal failure (ARF) (HCC)   Elevated liver enzymes   CAP (community acquired pneumonia)   Acute encephalopathy   Hypokalemia   Acute respiratory failure ([place])   Tracheostomy in place [facility])   Intubation of airway performed without difficulty   Pain   Discharge Instructions  Discharge Instructions    Diet - low sodium heart healthy   Complete by: As directed    Discharge instructions   Complete by: As directed    It was pleasure taking care of you. Please follow-up with ENT for your trach. Continue taking care of your trach as directed. Follow-up with your primary care provider.   Increase activity slowly   Complete by: As directed    No dressing needed   Complete by: As directed      Allergies as of 10/02/2020  No Known Allergies     Medication List    TAKE these medications   ferrous sulfate 325 (65 FE) MG EC tablet Take 325 mg by mouth daily.   levothyroxine 125 MCG tablet Commonly known as: SYNTHROID Take  125 mcg by mouth daily before breakfast.   therapeutic multivitamin-minerals tablet Take 1 tablet by mouth daily.   Zinc 220 (50 Zn) MG Caps Take 1 capsule by mouth daily.            Durable Medical Equipment  (From admission, onward)         Start     Ordered   10/01/20 1313  For home use only DME oxygen  Once       Comments: Trach collar 28% FIO2 5L.  Question Answer Comment  Length of Need 12 Months   Mode or (Route) Mask   Liters per Minute 5   Frequency Continuous (stationary and portable oxygen unit needed)   Oxygen conserving device Yes   Oxygen delivery system Gas      10/01/20 1313   09/25/20 1213  For home use only DME 3 n 1  Once        09/25/20 1213   09/25/20 1213  For home use only DME Walker rolling  Once       Question Answer Comment  Walker: With [place]   Patient needs a walker to treat with the following condition COVID-19   Patient needs a walker to treat with the following condition Weakness generalized      09/25/20 1213           Discharge Care Instructions  (From admission, onward)         Start     Ordered   10/02/20 0000  No dressing needed        10/02/20 [place], [place] Follow up.   Specialty: General Practice Why: Please schedule PCP appointment within 5-7 days of discharge. Contact information: [place] [address] [phone]        Beverly Gust, MD. Schedule an appointment as soon as possible for a visit in 1 week(s).   Specialty: Otolaryngology Contact information: [place] [address] [phone]              No Known Allergies  Consultations:  PCCM  ENT  Procedures/Studies: DG Abd 1 View  Result Date: 09/14/2020 CLINICAL DATA:  Abdominal pain. EXAM: ABDOMEN - 1 VIEW COMPARISON:  09/07/2020 FINDINGS: A feeding tube remains in place and terminates in the  expected region of the distal second portion of the duodenum, more distal than on the prior study. No dilated loops of bowel are seen to suggest obstruction. Cholecystectomy clips are noted. No acute osseous abnormality is identified. IMPRESSION: Nonobstructed bowel gas pattern. Electronically Signed   By: Logan Bores M.D.   On: 09/14/2020 16:52   DG Abd 1 View  Result Date: 09/07/2020 CLINICAL DATA:  NG tube advanced EXAM: ABDOMEN - 1 VIEW COMPARISON:  09/07/2020 FINDINGS: Feeding tube is been advanced and now overlies the region of the duodenal bulb or gastric antrum. Normal bowel gas pattern. IMPRESSION: Feeding tube advanced to the region of the duodenal bulb. Electronically Signed   By: Franchot Gallo M.D.   On: 09/07/2020 08:43   DG Abd 1 View  Result Date: 09/07/2020 CLINICAL DATA:  Nasogastric tube placement. EXAM: ABDOMEN - 1 VIEW COMPARISON:  08/14/2020. FINDINGS: Feeding tube terminates in the stomach. Bowel gas pattern is grossly unremarkable. Surgical clips in the right upper quadrant. IMPRESSION: Feeding tube tip is in the body of the stomach. Electronically Signed   By: Lorin Picket M.D.   On: 09/07/2020 08:39   DG Chest Port 1 View  Result Date: 09/09/2020 CLINICAL DATA:  Shortness of breath. EXAM: PORTABLE CHEST 1 VIEW COMPARISON:  09/07/2020 FINDINGS: 0430 hours. Low volume film. The cardio pericardial silhouette is enlarged. Tracheostomy tube again noted. A feeding tube passes into the stomach although the distal tip position is not included on the film. Diffuse patchy bilateral airspace disease is again noted with more confluent retrocardiac collapse/consolidation. Left pleural effusion not excluded. IMPRESSION: Low volume film with persistent diffuse patchy bilateral airspace disease and more confluent retrocardiac collapse/consolidation. Left effusion not excluded. Electronically Signed   By: Misty Stanley M.D.   On: 09/09/2020 06:26   DG Chest Port 1 View  Result Date:  09/07/2020 CLINICAL DATA:  COVID pneumonia.  Tracheostomy placed 10/8. EXAM: PORTABLE CHEST 1 VIEW COMPARISON:  09/03/2020 FINDINGS: Patient rotated right. Tracheostomy appropriately position. Feeding tube extends beyond the inferior aspect of the film. Cardiomegaly accentuated by AP portable technique. No right sided pleural effusion. Left lower chest is suboptimally evaluated secondary to technique and patient size. Small left pleural effusion cannot be excluded. No pneumothorax. Similar diffuse interstitial opacities. Slightly worsened left lower lobe aeration. IMPRESSION: Slightly worsened left lower lobe aeration, for which developing small pleural effusion and adjacent atelectasis are suspected. Otherwise, similar appearance of diffuse interstitial opacities indicative of COVID-19 pneumonia. Electronically Signed   By: Abigail Miyamoto M.D.   On: 09/07/2020 14:26   DG Chest Port 1 View  Result Date: 09/03/2020 CLINICAL DATA:  Shortness of breath EXAM: PORTABLE CHEST 1 VIEW COMPARISON:  Three days ago FINDINGS: Endotracheal tube tip is between the level of the clavicular heads and carina, with air column relatively difficult to visualize today. The enteric tube reaches the stomach with side port over the cardia region. Unchanged diffuse interstitial and airspace density. Cardiomegaly. No visible effusion or pneumothorax. Extensive artifact from EKG leads. IMPRESSION: Stable hardware positioning and infiltrates. Electronically Signed   By: Monte Fantasia M.D.   On: 09/03/2020 04:56     Subjective: Patient has no new complaints when seen before discharge today.  She was very happy to go home.  She was able to communicate in writing stating that she cannot wait to see her home and kids after this prolonged hospitalization.  Discharge Exam: Vitals:   10/02/20 0114 10/02/20 0427  BP: 105/79 107/83  Pulse: 89 96  Resp: 20 16  Temp: 97.6 F (36.4 C) 97.9 F (36.6 C)  SpO2: 100% 100%   Vitals:    10/01/20 2100 10/02/20 0114 10/02/20 0116 10/02/20 0427  BP:  105/79  107/83  Pulse:  89  96  Resp:  20  16  Temp:  97.6 F (36.4 C)  97.9 F (36.6 C)  TempSrc:  Axillary  Oral  SpO2: 100% 100%  100%  Weight:   114.2 kg   Height:        General: Pt is alert, awake, not in acute distress.  Tracheostomy in place Cardiovascular: RRR, S1/S2 +, no rubs, no gallops Respiratory: CTA bilaterally, no wheezing, no rhonchi Abdominal: Soft, NT, ND, bowel sounds + Extremities: no edema, no cyanosis   The results of significant diagnostics from this  hospitalization (including imaging, microbiology, ancillary and laboratory) are listed below for reference.    Microbiology: Recent Results (from the past 240 hour(s))  MRSA PCR Screening     Status: None   Collection Time: 09/22/20 12:09 PM   Specimen: Nasopharyngeal  Result Value Ref Range Status   MRSA by PCR NEGATIVE NEGATIVE Final    Comment:        The GeneXpert MRSA Assay (FDA approved for NASAL specimens only), is one component of a comprehensive MRSA colonization surveillance program. It is not intended to diagnose MRSA infection nor to guide or monitor treatment for MRSA infections. Performed at [facility], [address] [place], [place] [address]      Labs: BNP (last 3 results) Recent Labs    08/13/20 1806  BNP 60.4   Basic Metabolic Panel: No results for input(s): NA, K, CL, CO2, GLUCOSE, BUN, CREATININE, CALCIUM, MG, PHOS in the last 168 hours. Liver Function Tests: No results for input(s): AST, ALT, ALKPHOS, BILITOT, PROT, ALBUMIN in the last 168 hours. No results for input(s): LIPASE, AMYLASE in the last 168 hours. No results for input(s): AMMONIA in the last 168 hours. CBC: No results for input(s): WBC, NEUTROABS, HGB, HCT, MCV, PLT in the last 168 hours. Cardiac Enzymes: No results for input(s): CKTOTAL, CKMB, CKMBINDEX, TROPONINI in the last 168 hours. BNP: Invalid input(s): POCBNP CBG: No  results for input(s): GLUCAP in the last 168 hours. D-Dimer No results for input(s): DDIMER in the last 72 hours. Hgb A1c No results for input(s): HGBA1C in the last 72 hours. Lipid Profile No results for input(s): CHOL, HDL, LDLCALC, TRIG, CHOLHDL, LDLDIRECT in the last 72 hours. Thyroid function studies No results for input(s): TSH, T4TOTAL, T3FREE, THYROIDAB in the last 72 hours.  Invalid input(s): FREET3 Anemia work up No results for input(s): VITAMINB12, FOLATE, FERRITIN, TIBC, IRON, RETICCTPCT in the last 72 hours. Urinalysis    Component Value Date/Time   COLORURINE YELLOW (A) 03/26/2020 1932   APPEARANCEUR CLEAR (A) 03/26/2020 1932   LABSPEC 1.021 03/26/2020 1932   PHURINE 5.0 03/26/2020 1932   GLUCOSEU NEGATIVE 03/26/2020 1932   HGBUR NEGATIVE 03/26/2020 1932   BILIRUBINUR NEGATIVE 03/26/2020 [place] NEGATIVE 03/26/2020 1932   PROTEINUR NEGATIVE 03/26/2020 1932   NITRITE NEGATIVE 03/26/2020 1932   LEUKOCYTESUR NEGATIVE 03/26/2020 1932   Sepsis Labs Invalid input(s): PROCALCITONIN,  WBC,  LACTICIDVEN Microbiology Recent Results (from the past 240 hour(s))  MRSA PCR Screening     Status: None   Collection Time: 09/22/20 12:09 PM   Specimen: Nasopharyngeal  Result Value Ref Range Status   MRSA by PCR NEGATIVE NEGATIVE Final    Comment:        The GeneXpert MRSA Assay (FDA approved for NASAL specimens only), is one component of a comprehensive MRSA colonization surveillance program. It is not intended to diagnose MRSA infection nor to guide or monitor treatment for MRSA infections. Performed at [facility], [address] [place], [place] [address]     Time coordinating discharge: Over 30 minutes  SIGNED:  Lorella Nimrod, MD  [facility] 10/02/2020, 11:16 AM  If 7PM-7AM, please contact night-coverage [email or web address]  This record has been created using Systems analyst. Errors have been sought and corrected,but  may not always be located. Such creation errors do not reflect on the standard of care.

## 2020-11-12 ENCOUNTER — Other Ambulatory Visit: Payer: Self-pay

## 2020-11-12 ENCOUNTER — Encounter: Payer: Self-pay | Admitting: Pulmonary Disease

## 2020-11-12 ENCOUNTER — Ambulatory Visit (INDEPENDENT_AMBULATORY_CARE_PROVIDER_SITE_OTHER): Payer: Medicaid Other | Admitting: Pulmonary Disease

## 2020-11-12 VITALS — BP 132/74 | HR 83 | Temp 97.7°F | Ht 69.0 in | Wt 256.0 lb

## 2020-11-12 DIAGNOSIS — J45909 Unspecified asthma, uncomplicated: Secondary | ICD-10-CM

## 2020-11-12 DIAGNOSIS — R0602 Shortness of breath: Secondary | ICD-10-CM

## 2020-11-12 DIAGNOSIS — J9503 Malfunction of tracheostomy stoma: Secondary | ICD-10-CM | POA: Diagnosis not present

## 2020-11-12 DIAGNOSIS — Z8616 Personal history of COVID-19: Secondary | ICD-10-CM

## 2020-11-12 MED ORDER — BUDESONIDE 0.25 MG/2ML IN SUSP: 0.2500 mg | Freq: Two times a day (BID) | RESPIRATORY_TRACT | 11 refills | Status: DC

## 2020-11-12 MED ORDER — ALBUTEROL SULFATE (2.5 MG/3ML) 0.083% IN NEBU: 2.5000 mg | INHALATION_SOLUTION | RESPIRATORY_TRACT | 11 refills | Status: DC | PRN

## 2020-11-12 NOTE — Patient Instructions (Signed)
We are going to get a CT scan of your neck and chest to make sure there is no physical obstruction makes removal of your tracheostomy difficult  You were noted to have significant wheezing today.  We are starting you on nebulizers he will get albuterol that you will use 4 times a day and Pulmicort that you will use twice a day after the albuterol   We will see you in follow-up in 3 to 4 weeks time call sooner should any new problems arise   If the CT scan shows suspicion of narrowing of your windpipe, I recommend evaluation at [facility] by Dr. Beverely Risen

## 2020-11-12 NOTE — Progress Notes (Signed)
Subjective:    Patient ID: Kathy Vega, female    DOB: [birth year], 44 y.o.   MRN: [id number]  HPI Kathy Vega is a 44 year old former smoker with morbid obesity known to [facility] after a prolonged hospitalization for COVID-19 requiring mechanical ventilation.  The patient is now tracheostomy dependent.  She is not vent dependent.  She was to have her tracheostomy removed but there have been difficulties where she developed shortness of breath immediately after removal and has to be reinserted.  Examination by ENT did not show obvious stenosis.  We were asked to evaluate the patient for possible bronchoscopy.  However the patient has a # 6 Shiley trach and our current bronchoscope's will not go through this trach.  Examining the upper airway with the bronchoscope will be somewhat difficult as well.  The patient does not have shortness of breath unless the tracheostomy is removed.  This was attempted at Dr. Ileene Hutchinson office just this past week and she developed immediate difficulties.  She has noted some wheezing on occasion.  She has not had any copious secretions.  Lost significant amount of weight since her admission.  Admitted on 13 August 2020 and discharged on the of November 2021.  She was discharged on supplemental oxygen at 5 L/min via trach collar.  She is now down to 2 L/min via trach collar.  She is actually saturating 100% on this today.  Review of Systems A 10 point review of systems was performed and it is as noted above otherwise negative.  Past Medical History:  Diagnosis Date  . Anemia   . Thyroid disease    Patient Active Problem List   Diagnosis Date Noted  . Intubation of airway performed without difficulty   . Pain   . Tracheostomy in place [facility]) 09/18/2020  . Acute respiratory failure ([place])   . Pneumonia due to COVID-19 virus 08/13/2020  . Acute renal failure (ARF) ([place]) 08/13/2020  . Elevated liver enzymes 08/13/2020  . CAP (community acquired  pneumonia) 08/13/2020  . Acute encephalopathy 08/13/2020  . Hypokalemia 08/13/2020   Past Surgical History:  Procedure Laterality Date  . CHOLECYSTECTOMY    . LEG SURGERY    . TRACHEOSTOMY TUBE PLACEMENT N/A 09/04/2020   Procedure: TRACHEOSTOMY;  Surgeon: Beverly Gust, MD;  Location: [facility] ORS;  Service: ENT;  Laterality: N/A;   History reviewed. No pertinent family history. Social History   Tobacco Use  . Smoking status: Former Smoker    Packs/day: 0.50    Types: Cigarettes    Quit date: 2000    Years since quitting: 22.0  . Smokeless tobacco: Never Used  Substance Use Topics  . Alcohol use: No   No Known Allergies  Current Meds  Medication Sig  . ferrous sulfate 325 (65 FE) MG EC tablet Take 325 mg by mouth daily.  Marland Kitchen levothyroxine (SYNTHROID, LEVOTHROID) 125 MCG tablet Take 125 mcg by mouth daily before breakfast.  . therapeutic multivitamin-minerals (THERAGRAN-M) tablet Take 1 tablet by mouth daily.  . Zinc 220 (50 Zn) MG CAPS Take 1 capsule by mouth daily.     There is no immunization history on file for this patient.      Objective:   Physical Exam BP 132/74 (BP Location: Left Arm, Cuff Size: Large)   Pulse 83   Temp 97.7 F (36.5 C) (Temporal)   Ht 5\' 9"  (1.753 m)   Wt 256 lb (116.1 kg)   SpO2 100%   BMI 37.80 kg/m  GENERAL: Obese woman, no acute distress, tracheostomy in place.  He has a Passy-Muir valve and able to speak.  Right collar for supplemental oxygen. HEAD: Normocephalic, atraumatic.  EYES: Pupils equal, round, reactive to light.  No scleral icterus.  MOUTH: Nose/mouth/throat not examined due to masking requirements for COVID 19. NECK: Supple. No thyromegaly.  Tracheostomy in place as above. No JVD.  No adenopathy. PULMONARY: Good air entry bilaterally.  Wheezing bilaterally.  Otherwise no adventitious sounds CARDIOVASCULAR: S1 and S2. Regular rate and rhythm.  ABDOMEN: Obese otherwise unremarkable. MUSCULOSKELETAL: No joint deformity, no  clubbing, no edema.  NEUROLOGIC: No overt focal deficit. SKIN: Intact,warm,dry. PSYCH: Mood and behavior normal.    Assessment & Plan:     ICD-10-CM   1. 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  Referral to [facility] ENT Bronchoscopy not possible through #6 Shiley  3. Shortness of breath  R06.02    Related to removal of tracheostomy only Query tracheal stenosis  4. Personal history of COVID-19  Z86.16    This issue adds complexity to her management    Meds ordered this encounter  Medications  . budesonide (PULMICORT) 0.25 MG/2ML nebulizer solution    Sig: Take 2 mLs (0.25 mg total) by nebulization 2 (two) times daily.    Dispense:  120 mL    Refill:  11  . albuterol (PROVENTIL) (2.5 MG/3ML) 0.083% nebulizer solution    Sig: Take 3 mLs (2.5 mg total) by nebulization every 4 (four) hours as needed for wheezing or shortness of breath.    Dispense:  360 mL    Refill:  11   Orders Placed This Encounter  Procedures  . CT CHEST W CONTRAST    Standing Status:   Future    Standing Expiration Date:   11/12/2021    Order Specific Question:   If indicated for the ordered procedure, I authorize the administration of contrast media per Radiology protocol    Answer:   Yes    Order Specific Question:   Is patient pregnant?    Answer:   No    Order Specific Question:   Preferred imaging location?    Answer:   [facility]  . CT  SOFT TISSUE NECK W CONTRAST    Standing Status:   Future    Standing Expiration Date:   11/12/2021    Order Specific Question:   If indicated for the ordered procedure, I authorize the administration of contrast media per Radiology protocol    Answer:   Yes    Order Specific Question:   Is patient pregnant?    Answer:   No    Order Specific Question:   Preferred imaging location?    Answer:   [facility]    Referral Priority:   Routine    Referral Type:   Durable Medical Equipment Purchase    Number of Visits Requested:  1   Discussion:  The patient had a prolonged hospitalization due to COVID-19.  She now is having difficulties having the tracheostomy removed.  Examination by ENT was limited.  Unfortunately our current bronchoscopes cannot pass through a #6 Shiley trach due to larger diameter.  Upper airway examination will also be limited.  Recommend proceeding with CT neck and chest for further evaluation.  Ultimately, the patient may need referral to [facility] ENT, Dr. Beverely Risen for further evaluation and management of this difficult issue.  Another issue she is having is that of bronchospasm.  She does carry a diagnosis of asthma.  We will treat her with albuterol and budesonide nebulizers.  We will see the patient in follow-up in 3 to 4 weeks time call sooner should any new difficulties arise.  Renold Don, MD [facility]   *This note was dictated using voice recognition software/Dragon.  Despite best efforts to proofread, errors can occur which can change the meaning.  Any change was purely unintentional.

## 2020-11-23 ENCOUNTER — Telehealth: Payer: Self-pay | Admitting: Pulmonary Disease

## 2020-11-23 DIAGNOSIS — J45909 Unspecified asthma, uncomplicated: Secondary | ICD-10-CM

## 2020-11-23 MED ORDER — ALBUTEROL SULFATE (2.5 MG/3ML) 0.083% IN NEBU: 2.5000 mg | INHALATION_SOLUTION | Freq: Four times a day (QID) | RESPIRATORY_TRACT | 11 refills | Status: DC | PRN

## 2020-11-23 MED ORDER — ALBUTEROL SULFATE (2.5 MG/3ML) 0.083% IN NEBU: 2.5000 mg | INHALATION_SOLUTION | RESPIRATORY_TRACT | 11 refills | Status: DC | PRN

## 2020-11-23 NOTE — Telephone Encounter (Signed)
Pt calling requesting a refill on albuterol neb solution.  This has been sent to pharmacy as requested.    Pt also following up on CT scan ordered 12/16 by Dr. Jayme Cloud, wanting to schedule this.  PCCs please advise, thanks!

## 2020-11-24 NOTE — Telephone Encounter (Signed)
I called the patient today and had to leave a message for her to return my call.  I can't schedule her CTs until I get the CTs approved.  I will need Dr. Gonzalez note from 12/16 to fax the records to get them approved 

## 2020-11-24 NOTE — Telephone Encounter (Signed)
I called the patient today and had to leave a message for her to return my call.  I can't schedule her CTs until I get the CTs approved.  I will need Dr. Jayme Cloud note from 12/16 to fax the records to get them approved

## 2020-11-30 ENCOUNTER — Telehealth: Payer: Self-pay | Admitting: Pulmonary Disease

## 2020-11-30 ENCOUNTER — Encounter: Payer: Self-pay | Admitting: Pulmonary Disease

## 2020-11-30 NOTE — Telephone Encounter (Signed)
Spoke to patient, who stated that she called last week to schedule her CT scans and the call got disconnected.  Patient is requesting a call back to schedule scans.  Synetta Fail, please advise. Thanks

## 2020-12-01 NOTE — Telephone Encounter (Signed)
Patients Chest Ct and Neck Ct has been scheduled on 12/18/2020 @ 9 & 10am liquids only 4 hours prior to scans Patient is aware they are scheduled at [facility] Auth # [id number] valid 11/30/2020 to 12/28/2020 

## 2020-12-01 NOTE — Telephone Encounter (Signed)
Patients Chest Ct and Neck Ct has been scheduled on 12/18/2020 @ 9 & 10am liquids only 4 hours prior to scans Patient is aware they are scheduled at [facility] # [id number] valid 11/30/2020 to 12/28/2020

## 2020-12-17 ENCOUNTER — Telehealth: Payer: Self-pay | Admitting: Pulmonary Disease

## 2020-12-17 DIAGNOSIS — J9503 Malfunction of tracheostomy stoma: Secondary | ICD-10-CM

## 2020-12-17 MED ORDER — BUDESONIDE 0.25 MG/2ML IN SUSP: 0.2500 mg | Freq: Two times a day (BID) | RESPIRATORY_TRACT | 11 refills | Status: DC

## 2020-12-17 MED ORDER — ALBUTEROL SULFATE (2.5 MG/3ML) 0.083% IN NEBU: 2.5000 mg | INHALATION_SOLUTION | Freq: Four times a day (QID) | RESPIRATORY_TRACT | 11 refills | Status: AC | PRN

## 2020-12-17 NOTE — Telephone Encounter (Signed)
Sent RX for Albuterol and Budesonide nebulizers on behalf of Dr. Patsey Berthold to [facility] on file. If still not covered or too expensive could consider sending to [facility]. Nothing further needed.

## 2020-12-18 ENCOUNTER — Other Ambulatory Visit: Payer: Self-pay

## 2020-12-18 ENCOUNTER — Ambulatory Visit
Admission: RE | Admit: 2020-12-18 | Discharge: 2020-12-18 | Disposition: A | Discharge status: Home / Self Care | Payer: Medicaid Other | Source: Ambulatory Visit | Attending: Pulmonary Disease | Admitting: Pulmonary Disease

## 2020-12-18 DIAGNOSIS — J9503 Malfunction of tracheostomy stoma: Secondary | ICD-10-CM

## 2020-12-18 DIAGNOSIS — J398 Other specified diseases of upper respiratory tract: Secondary | ICD-10-CM

## 2020-12-18 MED ORDER — IOHEXOL 300 MG/ML  SOLN
75.0000 mL | Freq: Once | INTRAMUSCULAR | Status: AC | PRN
  Administered 2020-12-18: 75 mL via INTRAVENOUS

## 2021-01-11 ENCOUNTER — Ambulatory Visit: Payer: Medicaid Other | Admitting: Pulmonary Disease

## 2021-02-03 ENCOUNTER — Encounter: Payer: Self-pay | Admitting: Primary Care

## 2021-02-03 ENCOUNTER — Other Ambulatory Visit: Payer: Self-pay

## 2021-02-03 ENCOUNTER — Ambulatory Visit (INDEPENDENT_AMBULATORY_CARE_PROVIDER_SITE_OTHER): Payer: Medicaid Other | Admitting: Primary Care

## 2021-02-03 DIAGNOSIS — R059 Cough, unspecified: Secondary | ICD-10-CM

## 2021-02-03 DIAGNOSIS — J849 Interstitial pulmonary disease, unspecified: Secondary | ICD-10-CM

## 2021-02-03 DIAGNOSIS — J4530 Mild persistent asthma, uncomplicated: Secondary | ICD-10-CM | POA: Diagnosis not present

## 2021-02-03 DIAGNOSIS — J398 Other specified diseases of upper respiratory tract: Secondary | ICD-10-CM

## 2021-02-03 DIAGNOSIS — U071 COVID-19: Secondary | ICD-10-CM

## 2021-02-03 DIAGNOSIS — J1282 Pneumonia due to coronavirus disease 2019: Secondary | ICD-10-CM

## 2021-02-03 MED ORDER — GUAIFENESIN 100 MG/5ML PO SOLN: 5.0000 mL | ORAL | 0 refills | Status: DC | PRN

## 2021-02-03 NOTE — Progress Notes (Signed)
@Patient  ID: Kathy Vega, female    DOB: Mar 19, 1976, 45 y.o.   MRN: [id number]  Chief Complaint  Patient presents with  . Follow-up    Patient feels like she is doing good overall, wearing oxygen with trach.     Referring provider: [facility]  HPI: 45 year old female, former smoker quit in 2000.  Past medical history significant for asthma, covid-19, tracheal stenosis.  Patient of Dr. Patsey Berthold, last seen in office on 11/12/2020.  Ordered for CT neck and chest to rule out obstruction for removal tracheostomy.  Maintained on albuterol nebulizer QID and budesonide nebulizer BID. Recommended follow-up 3 to 4 weeks.   Previous [facility] pulmonary encounter: 11/12/20- Dr. Patsey Berthold, consult  Kathy Vega is a 45 year old former smoker with morbid obesity known to [facility] after a prolonged hospitalization for COVID-19 requiring mechanical ventilation.  The patient is now tracheostomy dependent.  She is not vent dependent.  She was to have her tracheostomy removed but there have been difficulties where she developed shortness of breath immediately after removal and has to be reinserted.  Examination by ENT did not show obvious stenosis.  We were asked to evaluate the patient for possible bronchoscopy.  However the patient has a # 6 Shiley trach and our current bronchoscope's will not go through this trach.  Examining the upper airway with the bronchoscope will be somewhat difficult as well.  The patient does not have shortness of breath unless the tracheostomy is removed.  This was attempted at Dr. Ileene Hutchinson office just this past week and she developed immediate difficulties.  She has noted some wheezing on occasion.  She has not had any copious secretions.  Lost significant amount of weight since her admission.  Admitted on 13 August 2020 and discharged on the of November 2021.  She was discharged on supplemental oxygen at 5 L/min via trach collar.  She is now down to 2  L/min via trach collar.  She is actually saturating 100% on this today.  02/03/2021- Interim hx  Patient presents today for overdue follow-up. Accompanied by female today. She is doing fairly well considering. Her breathing is stable. Using albuterol nebulizer QID and budesonide nebulizer BID. Mucus production has lessened, remains clear. CT neck showed that she does have some tracheal narrowing below tracheostomy. Patient was referred to Dr. Rosann Auerbach at [facility]. She has an apt end of March with [facility]. CT chest showed patchy ground-glass opacity, reticulation and perliobular lines throughout both lungs. Findings consistent with post infectious scarring from [place]. Denies f/c/s, chest pain, shortness of breath, chest tightness or wheezing.    Imaging: CT neck 12/18/2020 showed tracheostomy in satisfactory position.  There is mild weblike stenosis of trachea immediately below tracheostomy.  Remainder of trachea is patent without additional stenosis.  No mass or adenopathy in neck.  12/18/20 CT chest- Patchy ground-glass opacity, reticulation and perilobular lines throughout both lungs. 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 Borderline mild cardiomegaly. Small pericardial effusion.  No Known Allergies   There is no immunization history on file for this patient.  Past Medical History:  Diagnosis Date  . Anemia   . Thyroid disease     Tobacco History: Social History   Tobacco Use  Smoking Status Former Smoker  . Packs/day: 0.50  . Types: Cigarettes  . Quit date: 2000  . Years since quitting: 22.3  Smokeless Tobacco Never Used   Counseling given: Not Answered   Outpatient  Medications Prior to Visit  Medication Sig Dispense Refill  . albuterol (PROVENTIL) (2.5 MG/3ML) 0.083% nebulizer solution Take 3 mLs (2.5 mg total) by nebulization every 6 (six) hours as needed for wheezing or shortness of breath. 360 mL 11  . budesonide  (PULMICORT) 0.25 MG/2ML nebulizer solution Take 2 mLs (0.25 mg total) by nebulization 2 (two) times daily. 120 mL 11  . ferrous sulfate 325 (65 FE) MG EC tablet Take 325 mg by mouth daily.    Marland Kitchen levothyroxine (SYNTHROID, LEVOTHROID) 125 MCG tablet Take 125 mcg by mouth daily before breakfast.    . therapeutic multivitamin-minerals (THERAGRAN-M) tablet Take 1 tablet by mouth daily. 130 tablet 2  . Zinc 220 (50 Zn) MG CAPS Take 1 capsule by mouth daily. 90 capsule 0   No facility-administered medications prior to visit.    Review of Systems  Review of Systems  Constitutional: Negative.   HENT: Positive for congestion.   Respiratory: Positive for cough. Negative for chest tightness, shortness of breath and wheezing.      Physical Exam  BP 120/90 (BP Location: Right Arm, Patient Position: Sitting, Cuff Size: Large)   Pulse 89   Temp 98.6 F (37 C) (Temporal)   Ht 5\' 9"  (1.753 m)   Wt 259 lb 6.4 oz (117.7 kg)   SpO2 98%   BMI 38.31 kg/m  Physical Exam Constitutional:      General: She is not in acute distress.    Appearance: Normal appearance. She is not ill-appearing.  Cardiovascular:     Rate and Rhythm: Normal rate.  Pulmonary:     Effort: Pulmonary effort is normal. No respiratory distress.     Breath sounds: No wheezing or rales.     Comments: Trach collar with 1L oxygen Neurological:     General: No focal deficit present.     Mental Status: She is alert and oriented to person, place, and time. Mental status is at baseline.  Psychiatric:        Mood and Affect: Mood normal.        Behavior: Behavior normal.        Thought Content: Thought content normal.        Judgment: Judgment normal.      Lab Results:  CBC    Component Value Date/Time   WBC 4.5 09/24/2020 0432   RBC 3.80 (L) 09/24/2020 0432   HGB 9.7 (L) 09/24/2020 0432   HCT 30.6 (L) 09/24/2020 0432   HCT 22.0 (L) 03/15/2018 1622   PLT 291 09/24/2020 0432   MCV 80.5 09/24/2020 0432   MCH 25.5 (L)  09/24/2020 0432   MCHC 31.7 09/24/2020 0432   RDW 24.8 (H) 09/24/2020 0432   LYMPHSABS 1.2 09/16/2020 0627   MONOABS 0.5 09/16/2020 0627   EOSABS 0.1 09/16/2020 0627   BASOSABS 0.1 09/16/2020 0627    BMET    Component Value Date/Time   NA 136 09/24/2020 0432   NA 140 03/03/2014 1932   K 3.7 09/24/2020 0432   K 3.9 03/03/2014 1932   CL 100 09/24/2020 0432   CL 103 03/03/2014 1932   CO2 25 09/24/2020 0432   CO2 32 03/03/2014 1932   GLUCOSE 96 09/24/2020 0432   GLUCOSE 91 03/03/2014 1932   BUN 12 09/24/2020 0432   BUN 11 03/03/2014 1932   CREATININE 0.69 09/24/2020 0432   CREATININE 0.96 03/03/2014 1932   CALCIUM 9.6 09/24/2020 0432   CALCIUM 9.3 03/03/2014 1932   GFRNONAA >60 09/24/2020 8144  GFRNONAA >60 03/03/2014 1932   GFRAA >60 09/01/2020 0448   GFRAA >60 03/03/2014 1932    BNP    Component Value Date/Time   BNP 79.6 08/13/2020 1806    ProBNP No results found for: PROBNP  Imaging: No results found.   Assessment & Plan:   Asthma - Breathing is stable.  Mucus production has lessened, remains clear - Continue budesonide nebulizer BID and albuterol nebulizer QID - Recommend liquid guaifenesin 39ml every 4 hours to help with secreations  - FU in 3 months with Dr. Patsey Berthold    Tracheal stenosis - CT neck showed that she does have some tracheal narrowing below tracheostomy - She has an apt end of March with [facility]NT  Pneumonia due to COVID-19 virus - CT chest showed patchy ground-glass opacity, reticulation and perliobular lines throughout both lungs. Findings consistent with post infectious scarring from [place], NP 03/23/2021

## 2021-02-03 NOTE — Patient Instructions (Addendum)
CT neck showed that she does have some tracheal narrowing below tracheostomy CT chest consistent with post infectious scarring from COVID   Recommendations: Continue Budesonide nebulizer twice a day (steriod) Continue Albuterol nebulizer four times a day (bronchodilator) You are not supposed to crush mucinex Recommend liquid guaifenesin 61ml every 4 hours to help with secreations (Sent RX)  Orders: CT in June 2022   Follow-up: 3 months with Dr. Patsey Berthold

## 2021-03-23 ENCOUNTER — Telehealth: Payer: Self-pay | Admitting: Pulmonary Disease

## 2021-03-23 DIAGNOSIS — J398 Other specified diseases of upper respiratory tract: Secondary | ICD-10-CM | POA: Insufficient documentation

## 2021-03-23 DIAGNOSIS — J45909 Unspecified asthma, uncomplicated: Secondary | ICD-10-CM | POA: Insufficient documentation

## 2021-03-23 NOTE — Telephone Encounter (Signed)
CMN has been competed and faxed to [employer].  Received fax confirmation.  Nothing further needed at this time.

## 2021-03-23 NOTE — Assessment & Plan Note (Addendum)
-   CT chest showed patchy ground-glass opacity, reticulation and perliobular lines throughout both lungs. Findings consistent with post infectious scarring from COVID

## 2021-03-23 NOTE — Telephone Encounter (Signed)
Spoke to [place] with [employer], who is calling update on CMN. CMN has not been received.  It appears that [employer] had the incorrect fax number.  I have provided Mariann Laster with the correct fax number and requested that she fax CMN.

## 2021-03-23 NOTE — Assessment & Plan Note (Addendum)
-   Breathing is stable.  Mucus production has lessened, remains clear - Continue budesonide nebulizer BID and albuterol nebulizer QID - Recommend liquid guaifenesin 56ml every 4 hours to help with secreations  - FU in 3 months with Dr. Patsey Berthold

## 2021-03-23 NOTE — Progress Notes (Signed)
Agree with the details of the visit as noted by Elizabeth Walsh, NP.  C. Laura Lenaya Pietsch, MD [facility] 

## 2021-03-23 NOTE — Assessment & Plan Note (Signed)
-   CT neck showed that she does have some tracheal narrowing below tracheostomy - She has an apt end of March with [facility]NT

## 2021-04-16 HISTORY — PX: LARYNGOSCOPY: SHX5203

## 2021-05-03 ENCOUNTER — Ambulatory Visit: Payer: Medicaid Other | Admitting: Pulmonary Disease

## 2021-05-03 ENCOUNTER — Other Ambulatory Visit: Payer: Self-pay

## 2021-05-03 ENCOUNTER — Ambulatory Visit (INDEPENDENT_AMBULATORY_CARE_PROVIDER_SITE_OTHER): Payer: Medicaid Other | Admitting: Pulmonary Disease

## 2021-05-03 ENCOUNTER — Encounter: Payer: Self-pay | Admitting: Pulmonary Disease

## 2021-05-03 VITALS — BP 120/78 | HR 89 | Temp 97.5°F | Ht 69.0 in | Wt 259.8 lb

## 2021-05-03 DIAGNOSIS — J45909 Unspecified asthma, uncomplicated: Secondary | ICD-10-CM | POA: Diagnosis not present

## 2021-05-03 DIAGNOSIS — J398 Other specified diseases of upper respiratory tract: Secondary | ICD-10-CM

## 2021-05-03 DIAGNOSIS — Z8616 Personal history of COVID-19: Secondary | ICD-10-CM | POA: Diagnosis not present

## 2021-05-03 DIAGNOSIS — E669 Obesity, unspecified: Secondary | ICD-10-CM

## 2021-05-03 DIAGNOSIS — E66812 Obesity, class 2: Secondary | ICD-10-CM

## 2021-05-03 DIAGNOSIS — G4736 Sleep related hypoventilation in conditions classified elsewhere: Secondary | ICD-10-CM

## 2021-05-03 MED ORDER — PREDNISONE 10 MG (21) PO TBPK: ORAL_TABLET | ORAL | 0 refills | Status: DC

## 2021-05-03 NOTE — Patient Instructions (Addendum)
Continue to use your nebulizer.  You may use albuterol (Proventil) 1 vial via the nebulizer up to 4 times a day.  Use budesonide (Pulmicort) 1 vial via the nebulizer 2 times a day, use the albuterol first to open up your lungs then use the Pulmicort.  We sent in a prescription to your pharmacy for a prednisone taper hopefully this will help with your wheezing.  Let us see you back in 2 months time.

## 2021-05-03 NOTE — Progress Notes (Signed)
Subjective:    Patient ID: Kathy Vega, female    DOB: 09/12/1976, 45 y.o.   MRN: [id number]  HPI Kathy Vega is a 45 year old former smoker who follows here for the issue of shortness of breath.  This is a scheduled visit.  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  The patient underwent laryngoscopy and rigid bronchoscopy by Dr. Beverely Risen at [facility] on 20 May.  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  Since then she has noted some increased shortness of breath and wheezing.  This came with the increase in the warm weather.  She states that she is using her nebulizers and nocturnal oxygen.  I am not convinced that she is using her nebulizers correctly.  Did discuss proper use today.  She has not had any fevers, chills or sweats.  Cough is productive of white sputum which she freely expectorates through her trach.  She is phonating well with a Passy-Muir valve.  She voices no other complaint today.   Review of Systems A 10 point review of systems was performed and it is as noted above otherwise negative.  Patient Active Problem List   Diagnosis Date Noted  . Asthma 03/23/2021  . Tracheal stenosis 03/23/2021  . Intubation of airway performed without difficulty   . Pain   . Tracheostomy in place [facility]) 09/18/2020  . Acute respiratory failure ([place])   . Pneumonia due to COVID-19 virus 08/13/2020  . Acute renal failure (ARF) ([place]) 08/13/2020  . Elevated liver enzymes 08/13/2020  . CAP (community acquired pneumonia) 08/13/2020  . Acute encephalopathy 08/13/2020  . Hypokalemia 08/13/2020   No Known Allergies  Current Meds  Medication Sig  . albuterol (PROVENTIL) (2.5 MG/3ML) 0.083% nebulizer solution Take 3 mLs (2.5 mg total) by nebulization every 6 (six) hours as needed for wheezing or shortness of breath.  . budesonide  (PULMICORT) 0.25 MG/2ML nebulizer solution Take 2 mLs (0.25 mg total) by nebulization 2 (two) times daily.  . ferrous sulfate 325 (65 FE) MG EC tablet Take 325 mg by mouth daily.  Marland Kitchen guaiFENesin (ROBITUSSIN) 100 MG/5ML SOLN Take 5 mLs (100 mg total) by mouth every 4 (four) hours as needed for cough or to loosen phlegm.  Marland Kitchen levothyroxine (SYNTHROID, LEVOTHROID) 125 MCG tablet Take 125 mcg by mouth daily before breakfast.  . therapeutic multivitamin-minerals (THERAGRAN-M) tablet Take 1 tablet by mouth daily.  . Zinc 220 (50 Zn) MG CAPS Take 1 capsule by mouth daily.   We discussed Covid-19 precautions.  I reviewed the vaccine effectiveness and potential side effects in detail to include differences between mRNA vaccines and traditional vaccines (attenuated virus).  Discussion also offered of long-term effectiveness and safety profile which are unclear at this time.  Discussed current CDC guidance that all patients are recommended COVID-19 vaccinations-as long as they do not have allergy to components of the vaccine.    Objective:   Physical Exam BP 120/78 (BP Location: Left Arm, Cuff Size: Normal)   Pulse 89   Temp (!) 97.5 F (36.4 C) (Temporal)   Ht 5\' 9"  (1.753 m)   Wt 259 lb 12.8 oz (117.8 kg)   SpO2 98%   BMI 38.37 kg/m  GENERAL: Obese woman, no acute distress, tracheostomy in place.  He has a Passy-Muir valve and able to speak without difficulty.  Uses trach collar for supplemental oxygen at night. HEAD: Normocephalic,  atraumatic.  EYES: Pupils equal, round, reactive to light.  No scleral icterus.  MOUTH: Nose/mouth/throat not examined due to masking requirements for COVID 19. NECK: Supple. No lymphadenopathy.  Tracheostomy in place as above with Passy-Muir valve. No JVD.  Trachea midline. PULMONARY: Good air entry bilaterally.  Wheezing and rhonchi bilaterally.  Otherwise no adventitious sounds CARDIOVASCULAR: S1 and S2. Regular rate and rhythm.  ABDOMEN: Obese otherwise  unremarkable. MUSCULOSKELETAL: No joint deformity, no clubbing, no edema.  NEUROLOGIC: No overt focal deficit. SKIN: Intact,warm,dry. PSYCH: Mood and behavior normal.      Assessment & Plan:     ICD-10-CM   1. Persistent asthma without complication, unspecified asthma severity  J45.909    Reiterated proper use of nebulizers Albuterol 4 times a day via neb Pulmicort twice a day via neb following an albuterol dose Prednisone taper  2. Tracheal stenosis  J39.8    Being managed by Dr. Beverely Risen at [facility] This issue adds complexity to her management  3. Personal history of COVID-19  Z86.16    Residual issues after COVID-19 Prolonged ICU and ventilator time  4. Obesity, Class II, BMI 35-39.9, isolated (see actual BMI)  E66.9    This issue adds complexity to her management She has lost significant amount of weight since her COVID-19 diagnosis  5. Nocturnal hypoxemia due to obesity  E66.9    G47.36    Continue oxygen supplementation at nighttime   Meds ordered this encounter  Medications  . predniSONE (STERAPRED UNI-PAK 21 TAB) 10 MG (21) TBPK tablet    Sig: Use as directed in the package, this is a taper pack.    Dispense:  21 tablet    Refill:  0   Has poorly controlled asthma likely due to consistent use of nebulization treatments.  She was advised of proper nebulization schedule.  We will give her a prednisone taper to get her better compensated.  Her issues are aggravated by her tracheostomy dependence due to tracheal stenosis.  Appreciate Dr. Buckmire's help in this regard.  In follow-up in 2 months time she is to contact us prior to that time should any new difficulties arise.  Renold Don, MD [place] [facility]   *This note was dictated using voice recognition software/Dragon.  Despite best efforts to proofread, errors can occur which can change the meaning.  Any change was purely unintentional.

## 2021-06-03 ENCOUNTER — Other Ambulatory Visit: Payer: Self-pay

## 2021-06-03 ENCOUNTER — Other Ambulatory Visit: Payer: Self-pay | Admitting: Primary Care

## 2021-06-03 ENCOUNTER — Ambulatory Visit
Admission: RE | Admit: 2021-06-03 | Discharge: 2021-06-03 | Disposition: A | Discharge status: Home / Self Care | Payer: Medicaid Other | Source: Ambulatory Visit | Attending: Primary Care | Admitting: Primary Care

## 2021-06-03 ENCOUNTER — Telehealth: Payer: Self-pay | Admitting: Primary Care

## 2021-06-03 DIAGNOSIS — J849 Interstitial pulmonary disease, unspecified: Secondary | ICD-10-CM

## 2021-06-03 DIAGNOSIS — R059 Cough, unspecified: Secondary | ICD-10-CM

## 2021-06-03 NOTE — Telephone Encounter (Signed)
Spoke with CT tech. He stated that he was reviewing her notes and saw that the CT scan was being done to rule out ILD. He wanted to know if the CT needed to be a HRCT. Advised him it would be ok to do the HRCT. He verbalized understanding.   Nothing further needed at time of call.

## 2021-06-07 NOTE — Progress Notes (Signed)
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 Tracheostomy tube noted. No adenopathy. Heart side normal, small amount pericardial fluid, unlikely significant and slightly decreased from previous.

## 2021-06-09 NOTE — Progress Notes (Signed)
Thank you Dr. Darnell Level

## 2021-07-26 ENCOUNTER — Ambulatory Visit: Payer: Medicaid Other | Admitting: Pulmonary Disease

## 2021-07-26 IMAGING — DX DG CHEST 1V PORT
1 series · 1 of 1 positions shown · non-contrast
Comparison: [DATE] [DATE], [DATE] ([DATE] a.m.)

CLINICAL DATA: Hypoxia.

EXAM:
PORTABLE CHEST 1 VIEW

[chest ap]
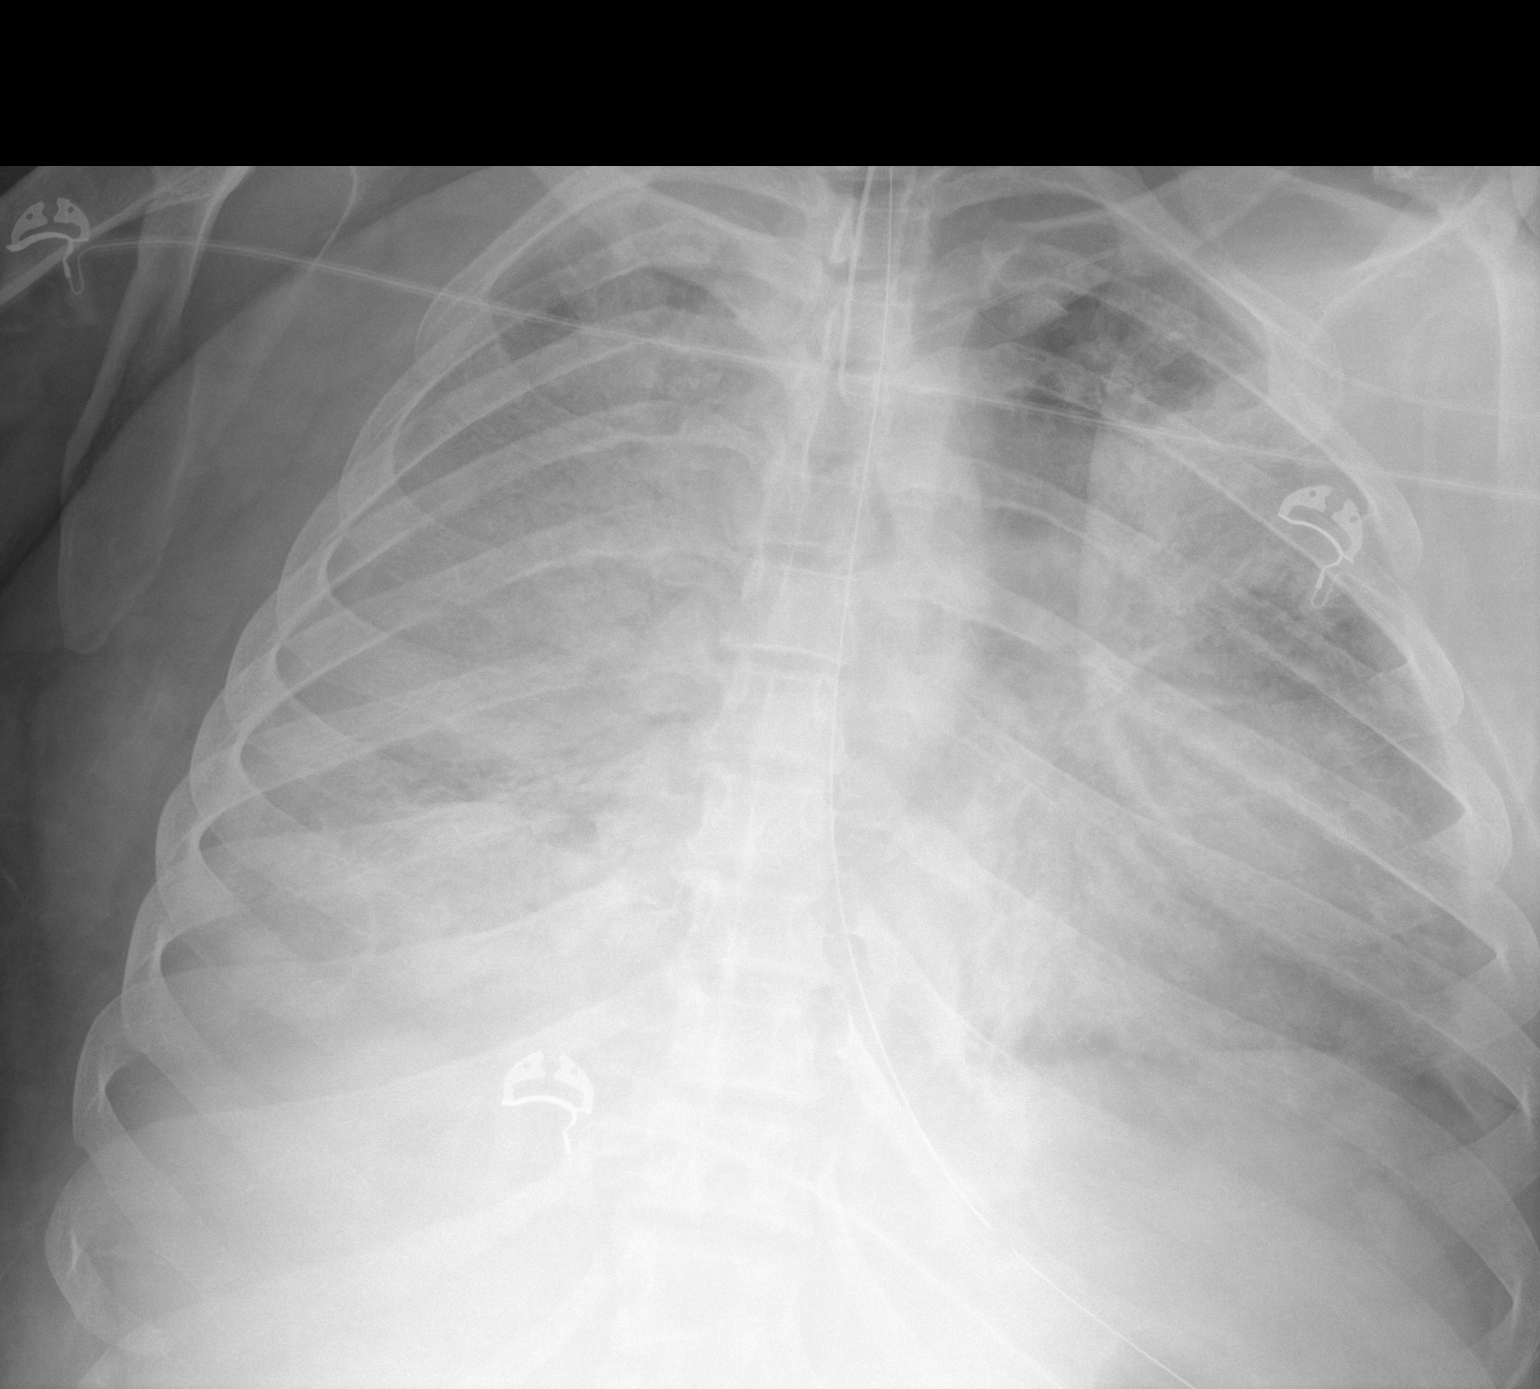

[1 of 1 positions shown; findings below may reference images not displayed]

FINDINGS: There is stable endotracheal tube, nasogastric tube and left-sided
PICC line positioning. Marked severity diffuse bilateral infiltrates
are seen right slightly greater than left. This is increased in
severity when compared to the prior study. A small right pleural
effusion is suspected. No pneumothorax is identified. The heart size
and mediastinal contours are limited in visualization. The
visualized skeletal structures are unremarkable.
IMPRESSION: Marked severity diffuse bilateral infiltrates, right slightly
greater than left, increased in severity when compared to the prior
study.

## 2021-07-28 ENCOUNTER — Emergency Department: Payer: Medicaid Other

## 2021-07-28 ENCOUNTER — Other Ambulatory Visit
Admission: RE | Admit: 2021-07-28 | Discharge: 2021-07-28 | Disposition: A | Discharge status: Home / Self Care | Payer: Medicaid Other | Source: Ambulatory Visit | Attending: Internal Medicine | Admitting: Internal Medicine

## 2021-07-28 ENCOUNTER — Other Ambulatory Visit: Payer: Self-pay

## 2021-07-28 ENCOUNTER — Observation Stay
Admission: EM | Admit: 2021-07-28 | Discharge: 2021-07-30 | Disposition: A | Discharge status: Home / Self Care | Payer: Medicaid Other | Attending: Internal Medicine | Admitting: Internal Medicine

## 2021-07-28 DIAGNOSIS — Z79899 Other long term (current) drug therapy: Secondary | ICD-10-CM | POA: Insufficient documentation

## 2021-07-28 DIAGNOSIS — R5383 Other fatigue: Secondary | ICD-10-CM | POA: Diagnosis present

## 2021-07-28 DIAGNOSIS — E039 Hypothyroidism, unspecified: Secondary | ICD-10-CM | POA: Insufficient documentation

## 2021-07-28 DIAGNOSIS — R0602 Shortness of breath: Secondary | ICD-10-CM | POA: Insufficient documentation

## 2021-07-28 DIAGNOSIS — Z20822 Contact with and (suspected) exposure to covid-19: Secondary | ICD-10-CM | POA: Insufficient documentation

## 2021-07-28 DIAGNOSIS — D62 Acute posthemorrhagic anemia: Secondary | ICD-10-CM | POA: Diagnosis not present

## 2021-07-28 DIAGNOSIS — N920 Excessive and frequent menstruation with regular cycle: Secondary | ICD-10-CM | POA: Diagnosis not present

## 2021-07-28 DIAGNOSIS — J45909 Unspecified asthma, uncomplicated: Secondary | ICD-10-CM | POA: Insufficient documentation

## 2021-07-28 DIAGNOSIS — N179 Acute kidney failure, unspecified: Secondary | ICD-10-CM | POA: Insufficient documentation

## 2021-07-28 DIAGNOSIS — D649 Anemia, unspecified: Secondary | ICD-10-CM | POA: Diagnosis not present

## 2021-07-28 DIAGNOSIS — R7401 Elevation of levels of liver transaminase levels: Secondary | ICD-10-CM | POA: Insufficient documentation

## 2021-07-28 DIAGNOSIS — Z87891 Personal history of nicotine dependence: Secondary | ICD-10-CM | POA: Insufficient documentation

## 2021-07-28 DIAGNOSIS — R609 Edema, unspecified: Secondary | ICD-10-CM | POA: Insufficient documentation

## 2021-07-28 DIAGNOSIS — D219 Benign neoplasm of connective and other soft tissue, unspecified: Secondary | ICD-10-CM

## 2021-07-28 LAB — BASIC METABOLIC PANEL
Anion gap: 9 (ref 5–15)
BUN: 14 mg/dL (ref 6–20)
CO2: 26 mmol/L (ref 22–32)
Calcium: 9.7 mg/dL (ref 8.9–10.3)
Chloride: 101 mmol/L (ref 98–111)
Creatinine, Ser: 1.1 mg/dL — ABNORMAL HIGH (ref 0.44–1.00)
GFR, Estimated: >60 mL/min (ref >60)
Glucose, Bld: 95 mg/dL (ref 70–99)
Potassium: 4 mmol/L (ref 3.5–5.1)
Sodium: 136 mmol/L (ref 135–145)

## 2021-07-28 LAB — URINALYSIS, COMPLETE (UACMP) WITH MICROSCOPIC
Bilirubin Urine: NEGATIVE
Glucose, UA: NEGATIVE mg/dL
Ketones, ur: NEGATIVE mg/dL
Leukocytes,Ua: NEGATIVE
Nitrite: NEGATIVE
Protein, ur: NEGATIVE mg/dL
Specific Gravity, Urine: 1.013 (ref 1.005–1.030)
pH: 6 (ref 5.0–8.0)

## 2021-07-28 LAB — CBC
HCT: 16.2 % — ABNORMAL LOW (ref 36.0–46.0)
Hemoglobin: 4.5 g/dL — CL (ref 12.0–15.0)
MCH: 19.7 pg — ABNORMAL LOW (ref 26.0–34.0)
MCHC: 27.8 g/dL — ABNORMAL LOW (ref 30.0–36.0)
MCV: 70.7 fL — ABNORMAL LOW (ref 80.0–100.0)
Platelets: 267 10*3/uL (ref 150–400)
RBC: 2.29 MIL/uL — ABNORMAL LOW (ref 3.87–5.11)
RDW: 20.4 % — ABNORMAL HIGH (ref 11.5–15.5)
WBC: 5.7 10*3/uL (ref 4.0–10.5)
nRBC: 0 % (ref 0.0–0.2)

## 2021-07-28 LAB — BRAIN NATRIURETIC PEPTIDE: B Natriuretic Peptide: 11.4 pg/mL (ref 0.0–100.0)

## 2021-07-28 LAB — IRON AND TIBC
Iron: 16 ug/dL — ABNORMAL LOW (ref 28–170)
Saturation Ratios: 3 % — ABNORMAL LOW (ref 10.4–31.8)
TIBC: 627 ug/dL — ABNORMAL HIGH (ref 250–450)
UIBC: 611 ug/dL

## 2021-07-28 LAB — FERRITIN: Ferritin: 3 ng/mL — ABNORMAL LOW (ref 11–307)

## 2021-07-28 LAB — HCG, QUANTITATIVE, PREGNANCY: hCG, Beta Chain, Quant, S: 1 m[IU]/mL (ref <5)

## 2021-07-28 LAB — PREPARE RBC (CROSSMATCH)

## 2021-07-28 LAB — PREGNANCY, URINE: Preg Test, Ur: NEGATIVE

## 2021-07-28 LAB — D-DIMER, QUANTITATIVE: D-Dimer, Quant: 0.32 ug/mL-FEU (ref 0.00–0.50)

## 2021-07-28 IMAGING — DX DG CHEST 1V PORT
1 series · 1 of 1 positions shown · non-contrast
Comparison: August 18, 2020

CLINICAL DATA: Hypoxia.  TIGSM-TL positive

EXAM:
PORTABLE CHEST 1 VIEW

[chest ap]
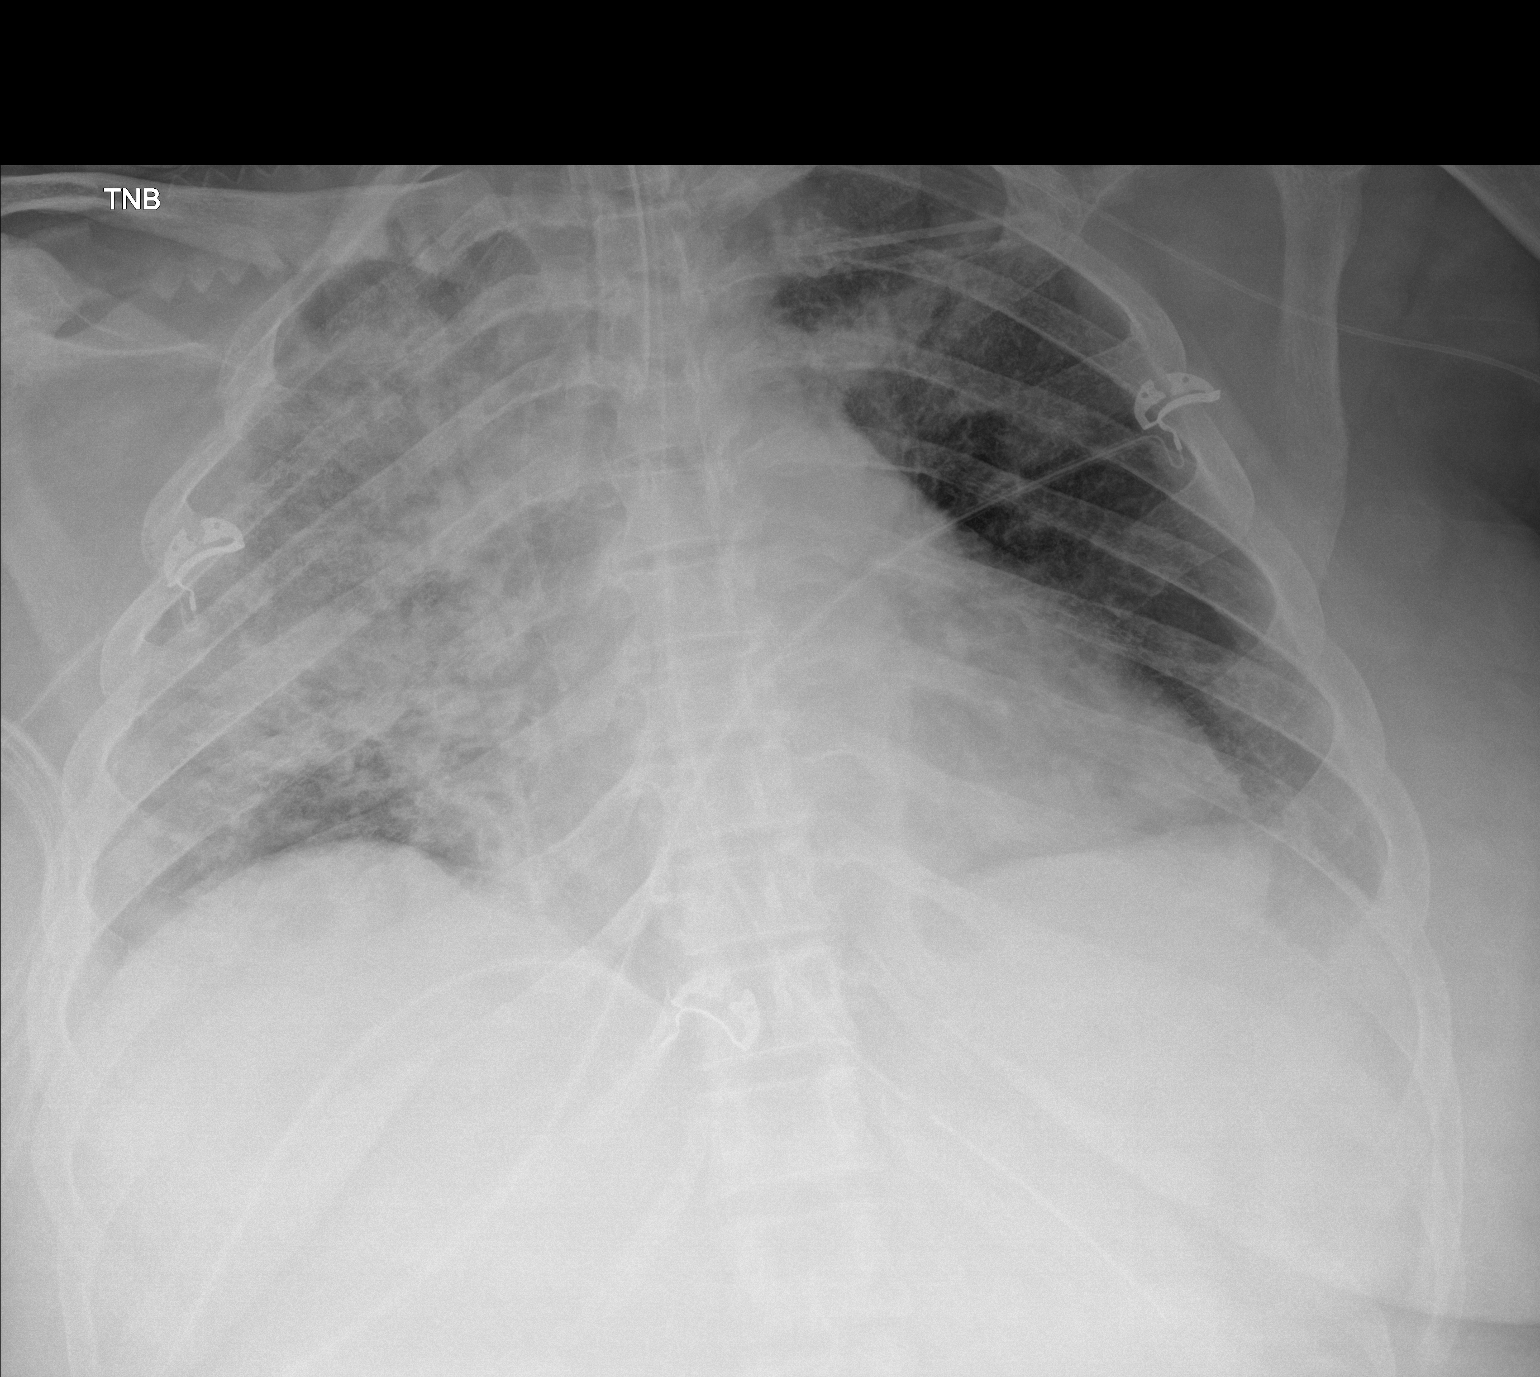

[1 of 1 positions shown; findings below may reference images not displayed]

FINDINGS: Endotracheal tube tip is 2.3 cm above the carina. Central catheter
tip is in the superior vena cava. Nasogastric tube tip and side port
are below the diaphragm. No pneumothorax. Extensive airspace opacity
throughout the right lung persists. There is slightly less opacity
in the left upper lobe compared to recent prior study. No new
opacity evident. Heart is mildly enlarged with pulmonary vascularity
normal. No adenopathy evident. No bone lesions.
IMPRESSION: Tube and catheter positions as described without pneumothorax.
Widespread airspace opacity persists throughout the right lung.
Slightly less opacity left upper lobe. No new opacity evident.
Stable cardiac prominence.

## 2021-07-28 MED ORDER — LEVOTHYROXINE SODIUM 50 MCG PO TABS
125.0000 ug | ORAL_TABLET | Freq: Every day | ORAL | Status: DC
  Administered 2021-07-29: 125 ug via ORAL
  Filled 2021-07-28: qty 1

## 2021-07-28 MED ORDER — SODIUM CHLORIDE 0.9 % IV SOLN: INTRAVENOUS | Status: DC

## 2021-07-28 MED ORDER — ACETAMINOPHEN 650 MG RE SUPP: 650.0000 mg | Freq: Four times a day (QID) | RECTAL | Status: DC | PRN

## 2021-07-28 MED ORDER — BUDESONIDE 0.25 MG/2ML IN SUSP: 0.2500 mg | Freq: Two times a day (BID) | RESPIRATORY_TRACT | Status: DC

## 2021-07-28 MED ORDER — ONDANSETRON HCL 4 MG/2ML IJ SOLN: 4.0000 mg | Freq: Four times a day (QID) | INTRAMUSCULAR | Status: DC | PRN

## 2021-07-28 MED ORDER — ONDANSETRON HCL 4 MG PO TABS: 4.0000 mg | ORAL_TABLET | Freq: Four times a day (QID) | ORAL | Status: DC | PRN

## 2021-07-28 MED ORDER — FERROUS SULFATE 325 (65 FE) MG PO TABS
325.0000 mg | ORAL_TABLET | Freq: Every day | ORAL | Status: DC
  Administered 2021-07-29: 325 mg via ORAL
  Filled 2021-07-28: qty 1

## 2021-07-28 MED ORDER — ALBUTEROL SULFATE (2.5 MG/3ML) 0.083% IN NEBU: 2.5000 mg | INHALATION_SOLUTION | Freq: Four times a day (QID) | RESPIRATORY_TRACT | Status: DC | PRN

## 2021-07-28 MED ORDER — ACETAMINOPHEN 325 MG PO TABS: 650.0000 mg | ORAL_TABLET | Freq: Four times a day (QID) | ORAL | Status: DC | PRN

## 2021-07-28 MED ORDER — SODIUM CHLORIDE 0.9 % IV SOLN
10.0000 mL/h | Freq: Once | INTRAVENOUS | Status: AC
  Administered 2021-07-28: 10 mL/h via INTRAVENOUS

## 2021-07-28 NOTE — ED Provider Notes (Signed)
[facility]  ____________________________________________   Event Date/Time   First MD Initiated Contact with Patient 07/28/21 1614     (approximate)  I have reviewed the triage vital signs and the nursing notes.   HISTORY  Chief Complaint low hemoglobin    HPI Kathy Vega is a 45 y.o. female past medical history of asthma, tracheal stenosis status post trach, anemia who presents with low blood count.  Patient went to [facility] clinic today for evaluation of fatigue and lower extremity swelling.  She had blood drawn which showed a hemoglobin of 4.  The patient endorses feeling fatigued and intermittently shortness of breath.  She denies chest pain, lightheadedness dizziness.  Patient has history of heavy menstrual periods.  She tells me that she gets a regular monthly period that generally last about 7 days and is heavy passing clots.  The last menstrual period started on 8/5 and has been ongoing however it is not heavy at this time she is only changing her pad about once per day.  She has a history of anemia and has required blood transfusions in the past and is on iron but has never been told why.  She is not on any sort of birth control and does not follow with GYN. She denies other sources of bleeding, denies melena or blood in her stool.         Past Medical History:  Diagnosis Date   Anemia    Thyroid disease     Patient Active Problem List   Diagnosis Date Noted   Asthma 03/23/2021   Tracheal stenosis 03/23/2021   Intubation of airway performed without difficulty    Pain    Tracheostomy in place [facility]) 09/18/2020   Acute respiratory failure ([place])    Pneumonia due to COVID-19 virus 08/13/2020   Acute renal failure (ARF) ([place]) 08/13/2020   Elevated liver enzymes 08/13/2020   CAP (community acquired pneumonia) 08/13/2020   Acute encephalopathy 08/13/2020   Hypokalemia 08/13/2020    Past Surgical History:  Procedure Laterality Date    CHOLECYSTECTOMY     LEG SURGERY     TRACHEOSTOMY TUBE PLACEMENT N/A 09/04/2020   Procedure: TRACHEOSTOMY;  Surgeon: Beverly Gust, MD;  Location: [facility] ORS;  Service: ENT;  Laterality: N/A;    Prior to Admission medications   Medication Sig Start Date End Date Taking? Authorizing Provider  albuterol (PROVENTIL) (2.5 MG/3ML) 0.083% nebulizer solution Take 3 mLs (2.5 mg total) by nebulization every 6 (six) hours as needed for wheezing or shortness of breath. 12/17/20   Martyn Ehrich, NP  budesonide (PULMICORT) 0.25 MG/2ML nebulizer solution Take 2 mLs (0.25 mg total) by nebulization 2 (two) times daily. 12/17/20 12/17/21  Martyn Ehrich, NP  ferrous sulfate 325 (65 FE) MG EC tablet Take 325 mg by mouth daily.    [provider]  guaiFENesin (ROBITUSSIN) 100 MG/5ML SOLN Take 5 mLs (100 mg total) by mouth every 4 (four) hours as needed for cough or to loosen phlegm. 02/03/21   Martyn Ehrich, NP  levothyroxine (SYNTHROID, LEVOTHROID) 125 MCG tablet Take 125 mcg by mouth daily before breakfast.    [provider]  predniSONE (STERAPRED UNI-PAK 21 TAB) 10 MG (21) TBPK tablet Use as directed in the package, this is a taper pack. 05/03/21   Tyler Pita, MD  therapeutic multivitamin-minerals [facility]) tablet Take 1 tablet by mouth daily. 10/02/20 10/02/21  Lorella Nimrod, MD  Zinc 220 (50 Zn) MG CAPS Take 1 capsule  by mouth daily. 10/02/20   Lorella Nimrod, MD    Allergies Patient has no known allergies.  No family history on file.  Social History Social History   Tobacco Use   Smoking status: Former    Packs/day: 0.50    Years: 5.00    Pack years: 2.50    Types: Cigarettes    Quit date: 2000    Years since quitting: 22.6   Smokeless tobacco: Never  Vaping Use   Vaping Use: Never used  Substance Use Topics   Alcohol use: No    Review of Systems   Review of Systems  Constitutional:  Negative for chills and fever.  Respiratory:  Positive for shortness  of breath. Negative for cough and chest tightness.   Cardiovascular:  Positive for leg swelling.  Gastrointestinal:  Negative for abdominal pain, anal bleeding, nausea and vomiting.  Genitourinary:  Positive for menstrual problem and vaginal bleeding. Negative for vaginal discharge.  All other systems reviewed and are negative.  Physical Exam Updated Vital Signs BP 111/76   Pulse 77   Temp 98 F (36.7 C) (Oral)   Resp 11   Ht '5\' 9"'$  (1.753 m)   Wt 117.9 kg   SpO2 100%   BMI 38.40 kg/m   Physical Exam Vitals and nursing note reviewed.  Constitutional:      General: She is not in acute distress.    Appearance: Normal appearance.  HENT:     Head: Normocephalic and atraumatic.  Eyes:     General: No scleral icterus.    Conjunctiva/sclera: Conjunctivae normal.  Pulmonary:     Effort: Pulmonary effort is normal. No respiratory distress.     Breath sounds: No stridor.     Comments: Tracheostomy in place Musculoskeletal:        General: No deformity or signs of injury.     Cervical back: Normal range of motion.     Right lower leg: Edema present.     Left lower leg: Edema present.     Comments: Mild lower extremity swelling of the bilateral ankles  Skin:    General: Skin is dry.     Coloration: Skin is not jaundiced or pale.  Neurological:     General: No focal deficit present.     Mental Status: She is alert and oriented to person, place, and time. Mental status is at baseline.  Psychiatric:        Mood and Affect: Mood normal.        Behavior: Behavior normal.     LABS (all labs ordered are listed, but only abnormal results are displayed)  Labs Reviewed  BASIC METABOLIC PANEL - Abnormal; Notable for the following components:      Result Value   Creatinine, Ser 1.10 (*)    All other components within normal limits  CBC - Abnormal; Notable for the following components:   RBC 2.29 (*)    Hemoglobin 4.5 (*)    HCT 16.2 (*)    MCV 70.7 (*)    MCH 19.7 (*)    MCHC  27.8 (*)    RDW 20.4 (*)    All other components within normal limits  URINALYSIS, COMPLETE (UACMP) WITH MICROSCOPIC - Abnormal; Notable for the following components:   Color, Urine STRAW (*)    APPearance HAZY (*)    Hgb urine dipstick MODERATE (*)    Bacteria, UA RARE (*)    All other components within normal limits  IRON AND TIBC - Abnormal;  Notable for the following components:   Iron 16 (*)    TIBC 627 (*)    Saturation Ratios 3 (*)    All other components within normal limits  FERRITIN - Abnormal; Notable for the following components:   Ferritin 3 (*)    All other components within normal limits  HCG, QUANTITATIVE, PREGNANCY  PREGNANCY, URINE  TYPE AND SCREEN  PREPARE RBC (CROSSMATCH)   ____________________________________________  EKG  Left axis deviation, normal intervals, low voltage, no acute ischemic changes ____________________________________________  RADIOLOGY Almeta Monas, personally viewed and evaluated these images (plain radiographs) as part of my medical decision making, as well as reviewing the written report by the radiologist.  ED MD interpretation: I reviewed the chest x-ray which shows cardiomegaly    ____________________________________________   PROCEDURES  Procedure(s) performed (including Critical Care):  Procedures   ____________________________________________   INITIAL IMPRESSION / ASSESSMENT AND PLAN / ED COURSE     The patient is a 45 year old female who is presenting with symptomatic anemia.  Her hemoglobin today is 4.5.  By history it seems like the most likely source is from heavy menstruation.  Her vital signs are within normal limits I suspect that this is a chronic anemia as she is very well compensated.  Does have some symptoms of shortness of breath.  We will send iron studies.  Her hCG is negative.  We will transfuse 2 units.  Will admit for ongoing fusion and would likely benefit from GYN consult. Clinical Course  as of 07/28/21 1831  Wed Jul 28, 2021  1634 Hemoglobin(!!): 4.5 [KM]  1826 HCG, Beta Chain, Quant, S: 1 [KM]    Clinical Course User Index [KM] Rada Hay, MD     ____________________________________________   FINAL CLINICAL IMPRESSION(S) / ED DIAGNOSES  Final diagnoses:  Symptomatic anemia     ED Discharge Orders     None        Note:  This document was prepared using Dragon voice recognition software and may include unintentional dictation errors.    Rada Hay, MD 07/28/21 [phone]

## 2021-07-28 NOTE — ED Notes (Signed)
Peanut butter/ graham cracker/ ice chips provided upon request

## 2021-07-28 NOTE — ED Notes (Signed)
Pt resting quietly at this time, family at bedside, blood continues to infuse.  Denies any needs at this time.

## 2021-07-28 NOTE — H&P (Addendum)
History and Physical    Kathy Vega [id number] DOB: 01/30/1976 DOA: 07/28/2021  [facility] [place]  Patient coming from: pcp office  I have personally briefly reviewed patient's old medical records in [place]  Chief Complaint: symptomatic anemia  HPI: Kathy Vega is a 45 y.o. female with medical history significant of  Severe covid disease 1 years ago requiring trach and prolonged hospitalization, now still with tracheostomy that is capped with no O2 requirement.In addition to history of thyroid disease, obesity  and anemia for which patient states she received PRBC for one year ago. She re-calls no specific evaluation of her anemia at that time or diagnosis. Patient presents from pcp with symptoms of doe/sob/ and fatigue after lab results noted hgb of 4.8.  Patient denies any HA/confusion/chest pain/ dark stool,blood in stools, nose bleeds,palpitations or presyncope. Patient however does endorse intermittent periods of heavy menses and notes that over the last 2 month her periods have lasted around 7 days and and at times she has noted clots, she states this has been on going intermittently for over one year. She noted no prior history of bleeding disorder or family history of bleeding disorder.  ED Course: 98.2, bp 93/50, hr 85, rr 16, sat 99% Wbc:5.7, hgb 4.5, plt257 Na 136, K4, cr 1.1 Labs: D-dimer neg Bnp 11.4  Cr 1.1 was 0.69 Iron:16, tibic high 627,  sat 3 ,ferritin 3 EKG:ns Lad, no hyper acute st -twave changes Cxr:nad Tx with 2 PRBC Review of Systems: As per HPI otherwise 10 point review of systems negative.   Past Medical History:  Diagnosis Date   Anemia    Thyroid disease     Past Surgical History:  Procedure Laterality Date   CHOLECYSTECTOMY     LEG SURGERY     TRACHEOSTOMY TUBE PLACEMENT N/A 09/04/2020   Procedure: TRACHEOSTOMY;  Surgeon: Beverly Gust, MD;  Location: [facility] ORS;  Service: ENT;  Laterality: N/A;      reports that she quit smoking about 22 years ago. Her smoking use included cigarettes. She has a 2.50 pack-year smoking history. She has never used smokeless tobacco. She reports that she does not drink alcohol. No history on file for drug use.  No Known Allergies  No family history on file. Thyroid disease  Prior to Admission medications   Medication Sig Start Date End Date Taking? Authorizing Provider  ferrous sulfate 325 (65 FE) MG EC tablet Take 325 mg by mouth daily.   Yes [provider]  levothyroxine (SYNTHROID, LEVOTHROID) 125 MCG tablet Take 125 mcg by mouth daily before breakfast.   Yes [provider]  albuterol (PROVENTIL) (2.5 MG/3ML) 0.083% nebulizer solution Take 3 mLs (2.5 mg total) by nebulization every 6 (six) hours as needed for wheezing or shortness of breath. 12/17/20   Martyn Ehrich, NP  budesonide (PULMICORT) 0.25 MG/2ML nebulizer solution Take 2 mLs (0.25 mg total) by nebulization 2 (two) times daily. 12/17/20 12/17/21  Martyn Ehrich, NP  guaiFENesin (ROBITUSSIN) 100 MG/5ML SOLN Take 5 mLs (100 mg total) by mouth every 4 (four) hours as needed for cough or to loosen phlegm. Patient not taking: No sig reported 02/03/21   Martyn Ehrich, NP  predniSONE (STERAPRED UNI-PAK 21 TAB) 10 MG (21) TBPK tablet Use as directed in the package, this is a taper pack. Patient not taking: No sig reported 05/03/21   Tyler Pita, MD  therapeutic multivitamin-minerals [facility]) tablet Take 1 tablet by mouth daily.  Patient not taking: No sig reported 10/02/20 10/02/21  Lorella Nimrod, MD  Zinc 220 (50 Zn) MG CAPS Take 1 capsule by mouth daily. Patient not taking: No sig reported 10/02/20   Lorella Nimrod, MD    Physical Exam: Vitals:   07/28/21 2100 07/28/21 2136 07/28/21 2153 07/28/21 2200  BP: 111/69 108/79 (!) 93/50 110/71  Pulse: 86 86 85 86  Resp: (!) '21 15 16 17  '$ Temp: 98.3 F (36.8 C) 98.3 F (36.8 C) 98.2 F (36.8 C)   TempSrc: Oral Oral Oral    SpO2: 99%  99% 100%  Weight:      Height:        Constitutional: NAD, calm, comfortable Vitals:   07/28/21 2100 07/28/21 2136 07/28/21 2153 07/28/21 2200  BP: 111/69 108/79 (!) 93/50 110/71  Pulse: 86 86 85 86  Resp: (!) '21 15 16 17  '$ Temp: 98.3 F (36.8 C) 98.3 F (36.8 C) 98.2 F (36.8 C)   TempSrc: Oral Oral Oral   SpO2: 99%  99% 100%  Weight:      Height:       Eyes: PERRL, lids and conjunctivae pale ENMT: Mucous membranes are moist. Posterior pharynx clear of any exudate or lesions.Normal dentition.  Neck: normal, supple, no masses, no thyromegaly Respiratory: clear to auscultation bilaterally, no wheezing, no crackles. Normal respiratory effort. No accessory muscle use.  Cardiovascular: Regular rate and rhythm, no murmurs / rubs / gallops. + extremity edema. 2+ pedal pulses. No carotid bruits.  Abdomen: no tenderness, no masses palpated. No hepatosplenomegaly. Bowel sounds positive.  Musculoskeletal: no clubbing / cyanosis. No joint deformity upper and lower extremities. Good ROM, no contractures. Normal muscle tone.  Skin: no rashes, lesions, ulcers. No induration Neurologic: CN 2-12 grossly intact. Sensation intact, . Strength 5/5 in all 4.  Psychiatric: Normal judgment and insight. Alert and oriented x 3. Normal mood.    Labs on Admission: I have personally reviewed following labs and imaging studies  CBC: Recent Labs  Lab 07/28/21 1605  WBC 5.7  HGB 4.5*  HCT 16.2*  MCV 70.7*  PLT 99991111   Basic Metabolic Panel: Recent Labs  Lab 07/28/21 1605  NA 136  K 4.0  CL 101  CO2 26  GLUCOSE 95  BUN 14  CREATININE 1.10*  CALCIUM 9.7   GFR: Estimated Creatinine Clearance: 88.6 mL/min (A) (by C-G formula based on SCr of 1.1 mg/dL (H)). Liver Function Tests: No results for input(s): AST, ALT, ALKPHOS, BILITOT, PROT, ALBUMIN in the last 168 hours. No results for input(s): LIPASE, AMYLASE in the last 168 hours. No results for input(s): AMMONIA in the last 168  hours. Coagulation Profile: No results for input(s): INR, PROTIME in the last 168 hours. Cardiac Enzymes: No results for input(s): CKTOTAL, CKMB, CKMBINDEX, TROPONINI in the last 168 hours. BNP (last 3 results) No results for input(s): PROBNP in the last 8760 hours. HbA1C: No results for input(s): HGBA1C in the last 72 hours. CBG: No results for input(s): GLUCAP in the last 168 hours. Lipid Profile: No results for input(s): CHOL, HDL, LDLCALC, TRIG, CHOLHDL, LDLDIRECT in the last 72 hours. Thyroid Function Tests: No results for input(s): TSH, T4TOTAL, FREET4, T3FREE, THYROIDAB in the last 72 hours. Anemia Panel: Recent Labs    07/28/21 1605  FERRITIN 3*  TIBC 627*  IRON 16*   Urine analysis:    Component Value Date/Time   COLORURINE STRAW (A) 07/28/2021 1718   APPEARANCEUR HAZY (A) 07/28/2021 1718   LABSPEC 1.013 07/28/2021  [place] 6.0 07/28/2021 1718   GLUCOSEU NEGATIVE 07/28/2021 1718   HGBUR MODERATE (A) 07/28/2021 1718   BILIRUBINUR NEGATIVE 07/28/2021 1718   KETONESUR NEGATIVE 07/28/2021 1718   PROTEINUR NEGATIVE 07/28/2021 1718   NITRITE NEGATIVE 07/28/2021 1718   LEUKOCYTESUR NEGATIVE 07/28/2021 1718    Radiological Exams on Admission: DG Chest 2 View  Result Date: 07/28/2021 CLINICAL DATA:  Shortness of breath. EXAM: CHEST - 2 VIEW COMPARISON:  September 09, 2020 FINDINGS: A tracheostomy tube is noted. A thin, radiopaque wire-like structure is seen overlying the superior mediastinum at the level of the clavicles. There is no evidence of acute infiltrate, pleural effusion or pneumothorax. The cardiac silhouette is mildly enlarged and stable in appearance. The visualized skeletal structures are unremarkable. IMPRESSION: No active cardiopulmonary disease. Electronically Signed   By: Virgina Norfolk M.D.   On: 07/28/2021 17:04    EKG: Independently reviewed. See above  Assessment/Plan  Symptomatic anemia of blood loss Severe IDA Mild hypotension -presumed  due to heavy menses -s/p 2 units prbc in ed -continue on tele /admit to med tele -transfuse for hgb<7 -monitor hgb serially  -consider gyn consult in am if patient does not rebound -ferritin is 3 will order iron infusion as well  -gentle ivf support  AKI -due to anemia /low volume -transfuse blood monitor labs  -gently ivfs overnight  -avoid nephrotoxic medications  Chronic lower extremity swelling -d-dimer negative  -no protein in urine , bnp low at 11 -to be complete will order echo/liver u/s   Malar Skin rash on face -ana with reflex sent  -consider out patient derm consult /rheum based on results   Prolonged COVID hospitalization  S/p tracheostomy  -no need for supplemental O2  -tracheostomy capped  -trach care per protocol   Hypothyroidism  -check tsh -continue synthroid     DVT prophylaxis: scd Code Status: full Family Communication: husband at bedside Dorette Grate (Spouse)  [phone] ([place]) Disposition Plan: patient  expected to be admitted less than 2 midnights  Consults called: n/a Admission status: medical floor   Clance Boll MD [facility]   If 7PM-7AM, please contact night-coverage [email or web address] Password [email or web address]  07/28/2021, 11:11 PM

## 2021-07-28 NOTE — ED Triage Notes (Signed)
Pt to ED from [facility] for hgb 4, reports intermittent shob and bilateral feet pain with swelling.  Pt has trach, 100% on RA. States sometimes uses supplemental O2. Received trach when had COVID.  Skin color pale. Recent blood transfusion Denies vomiting blood or dark tarry stools.

## 2021-07-29 ENCOUNTER — Observation Stay: Payer: Medicaid Other

## 2021-07-29 ENCOUNTER — Observation Stay (HOSPITAL_BASED_OUTPATIENT_CLINIC_OR_DEPARTMENT_OTHER)
Admission: EM | Admit: 2021-07-29 | Discharge: 2021-07-29 | Disposition: A | Discharge status: Home / Self Care | Payer: Medicaid Other | Source: Home / Self Care | Attending: Internal Medicine | Admitting: Internal Medicine

## 2021-07-29 DIAGNOSIS — R0609 Other forms of dyspnea: Secondary | ICD-10-CM | POA: Diagnosis not present

## 2021-07-29 DIAGNOSIS — D649 Anemia, unspecified: Secondary | ICD-10-CM

## 2021-07-29 LAB — ECHOCARDIOGRAM COMPLETE
AR max vel: 2.77 cm2
AV Area VTI: 2.28 cm2
AV Area mean vel: 2.62 cm2
AV Mean grad: 3 mmHg
AV Peak grad: 6.4 mmHg
Ao pk vel: 1.26 m/s
Area-P 1/2: 4.33 cm2
Height: 69 in
MV VTI: 2.78 cm2
S' Lateral: 3.87 cm
Weight: 4160 oz

## 2021-07-29 LAB — HEMOGLOBIN AND HEMATOCRIT, BLOOD
HCT: 26.4 % — ABNORMAL LOW (ref 36.0–46.0)
HCT: 27.2 % — ABNORMAL LOW (ref 36.0–46.0)
Hemoglobin: 8.5 g/dL — ABNORMAL LOW (ref 12.0–15.0)
Hemoglobin: 8.6 g/dL — ABNORMAL LOW (ref 12.0–15.0)

## 2021-07-29 LAB — COMPREHENSIVE METABOLIC PANEL
ALT: 11 U/L (ref 0–44)
AST: 24 U/L (ref 15–41)
Albumin: 4.1 g/dL (ref 3.5–5.0)
Alkaline Phosphatase: 51 U/L (ref 38–126)
Anion gap: 8 (ref 5–15)
BUN: 14 mg/dL (ref 6–20)
CO2: 26 mmol/L (ref 22–32)
Calcium: 9.3 mg/dL (ref 8.9–10.3)
Chloride: 103 mmol/L (ref 98–111)
Creatinine, Ser: 1.01 mg/dL — ABNORMAL HIGH (ref 0.44–1.00)
GFR, Estimated: >60 mL/min (ref >60)
Glucose, Bld: 103 mg/dL — ABNORMAL HIGH (ref 70–99)
Potassium: 3.7 mmol/L (ref 3.5–5.1)
Sodium: 137 mmol/L (ref 135–145)
Total Bilirubin: 0.8 mg/dL (ref 0.3–1.2)
Total Protein: 7.2 g/dL (ref 6.5–8.1)

## 2021-07-29 LAB — CBC
HCT: 21 % — ABNORMAL LOW (ref 36.0–46.0)
Hemoglobin: 6.6 g/dL — ABNORMAL LOW (ref 12.0–15.0)
MCH: 24.4 pg — ABNORMAL LOW (ref 26.0–34.0)
MCHC: 31.4 g/dL (ref 30.0–36.0)
MCV: 77.8 fL — ABNORMAL LOW (ref 80.0–100.0)
Platelets: 242 10*3/uL (ref 150–400)
RBC: 2.7 MIL/uL — ABNORMAL LOW (ref 3.87–5.11)
RDW: 22.3 % — ABNORMAL HIGH (ref 11.5–15.5)
WBC: 5.9 10*3/uL (ref 4.0–10.5)
nRBC: 0.5 % — ABNORMAL HIGH (ref 0.0–0.2)

## 2021-07-29 LAB — TSH: TSH: 144.496 u[IU]/mL — ABNORMAL HIGH (ref 0.350–4.500)

## 2021-07-29 LAB — SEDIMENTATION RATE: Sed Rate: 58 mm/hr — ABNORMAL HIGH (ref 0–20)

## 2021-07-29 LAB — RESP PANEL BY RT-PCR (FLU A&B, COVID) ARPGX2
Influenza A by PCR: NEGATIVE
Influenza B by PCR: NEGATIVE
SARS Coronavirus 2 by RT PCR: NEGATIVE

## 2021-07-29 LAB — D-DIMER, QUANTITATIVE: D-Dimer, Quant: 0.32 ug/mL-FEU (ref 0.00–0.50)

## 2021-07-29 LAB — BRAIN NATRIURETIC PEPTIDE: B Natriuretic Peptide: 11 pg/mL (ref 0.0–100.0)

## 2021-07-29 LAB — C-REACTIVE PROTEIN: CRP: 1.1 mg/dL — ABNORMAL HIGH (ref <1.0)

## 2021-07-29 MED ORDER — NORETHINDRONE ACETATE 5 MG PO TABS
5.0000 mg | ORAL_TABLET | Freq: Three times a day (TID) | ORAL | Status: DC
  Filled 2021-07-29 (×5): qty 1

## 2021-07-29 MED ORDER — SODIUM CHLORIDE 0.9 % IV SOLN
510.0000 mg | Freq: Once | INTRAVENOUS | Status: AC
  Administered 2021-07-29: 510 mg via INTRAVENOUS
  Filled 2021-07-29: qty 17

## 2021-07-29 MED ORDER — SODIUM CHLORIDE 0.9% IV SOLUTION
Freq: Once | INTRAVENOUS | Status: AC
  Filled 2021-07-29: qty 250

## 2021-07-29 MED ORDER — FERROUS SULFATE 325 (65 FE) MG PO TBEC: 325.0000 mg | DELAYED_RELEASE_TABLET | ORAL | Status: DC

## 2021-07-29 MED ORDER — NORETHINDRONE ACETATE 5 MG PO TABS
5.0000 mg | ORAL_TABLET | Freq: Three times a day (TID) | ORAL | 0 refills | Status: DC
Start: 2021-07-29 — End: 2021-07-29

## 2021-07-29 MED ORDER — NORETHINDRONE ACETATE 5 MG PO TABS: 5.0000 mg | ORAL_TABLET | Freq: Two times a day (BID) | ORAL | 0 refills | Status: DC

## 2021-07-29 MED ORDER — PERFLUTREN LIPID MICROSPHERE
1.0000 mL | INTRAVENOUS | Status: AC | PRN
Start: 2021-07-29 — End: 2021-07-29
  Administered 2021-07-29: 3 mL via INTRAVENOUS
  Filled 2021-07-29: qty 10

## 2021-07-29 NOTE — Progress Notes (Signed)
*  PRELIMINARY RESULTS* Echocardiogram 2D Echocardiogram has been performed.  Kathy Vega 07/29/2021, 8:59 AM

## 2021-07-29 NOTE — Progress Notes (Signed)
RT note:  Called by RN requesting assistance with trach.  Patient has Size 4 [place]iley [phone]) which she maintains without assistance.  Provided inner cannulas per patient request.  Patient denied need for any further assistance.  PMV in place with strong voice/cough.

## 2021-07-29 NOTE — ED Notes (Signed)
U/S in room at this time. 

## 2021-07-29 NOTE — ED Notes (Signed)
Barnetta Chapel RN aware of assigned bed

## 2021-07-29 NOTE — Consult Note (Signed)
    Consult Note  Requesting Attending Physician :  Florencia Reasons, MD Service Requesting Consult : '@[email or web address]'$ @  Assessment/Recommendations:  Kathy Vega is a 45 y.o. female seen in consultation at the request of Florencia Reasons, MD   Active Problems:   Symptomatic anemia    1.  Symptomatic anemia from menorrhagia.   She is now hemodynamically stable after transfusion and having scant vaginal bleeding  Recommend pelvic ultrasound for uterine fibroids/endometrial polyps  Recommend outpatient endometrial biopsy when patient not bleeding. (Rule out hyperplasia or malignancy) 2.  Recommend discharge on Aygestin 5 mg twice daily until work-up for menorrhagia can be complete. (3 weeks)  Patient desires follow-up at [facility]. 3.  Possible future use of hormonal IUD, Myfembree, or endometrial ablation -which ever more appropriate. 4.  Continue oral iron for 6 weeks.   Patient presented to ED with symptomatic anemia.  She describes a history of very heavy menstrual bleeding last month.  She states that her menses are irregular sometimes very heavy, sometimes very light, sometimes only spotting.  Since hospital admission she states that she has had only spotting. Family history of uterine fibroids. Patient has a tubal ligation for birth control and is not on any hormonal medication.   Obstetric History:  OB History  No obstetric history on file.    Social History: Social History   Socioeconomic History   Marital status: Married    Spouse name: Not on file   Number of children: Not on file   Years of education: Not on file   Highest education level: Not on file  Occupational History   Not on file  Tobacco Use   Smoking status: Former    Packs/day: 0.50    Years: 5.00    Pack years: 2.50    Types: Cigarettes    Quit date: 2000    Years since quitting: 22.6   Smokeless tobacco: Never  Vaping Use   Vaping Use: Never used  Substance and Sexual Activity   Alcohol use: No    Drug use: Not on file   Sexual activity: Not on file  Other Topics Concern   Not on file  Social History Narrative   Not on file   Social Determinants of Health   Financial Resource Strain: Not on file  Food Insecurity: Not on file  Transportation Needs: Not on file  Physical Activity: Not on file  Stress: Not on file  Social Connections: Not on file    Family History: Family history of uterine fibroids requiring surgical removal  Objective : Awaiting pelvic ultrasound prior to discharge.  Vital signs in last 24 hours: Temp:  [97.9 F (36.6 C)-98.6 F (37 C)] 98 F (36.7 C) (09/01 0700) Pulse Rate:  [69-98] 77 (09/01 1200) Resp:  [10-27] 10 (09/01 1200) BP: (93-138)/(50-100) 123/83 (09/01 1000) SpO2:  [98 %-100 %] 99 % (09/01 1200)  Intake/Output last 3 shifts: I/O last 3 completed shifts: In: 66 [Blood:744] Out: -    I spent 33 minutes involved in the care of this patient preparing to see the patient by obtaining and reviewing her medical history (including labs, imaging tests and prior procedures), documenting clinical information in the electronic health record (EHR), counseling and coordinating care plans, writing and sending prescriptions, ordering tests or procedures and in direct communicating with the patient and medical staff discussing pertinent items from her history and physical exam.   Finis Bud, M.D. 07/29/2021 4:17 PM

## 2021-07-29 NOTE — Discharge Summary (Addendum)
Discharge Summary  Kathy Vega DJT:[id number] DOB: May 22, 1976  PCP: [facility] [place] date: 07/28/2021 Discharge date: 07/29/2021  Time spent: 41mns  Recommendations for Outpatient Follow-up:  F/u with PCP within a week  for hospital discharge follow up, repeat cbc/bmp at follow up. Pcp to follow up on ANA test result , PCP to follow-up final echocardiogram result, pcp to repeat tsh in 4-6 weeks F/u with Dr Kathy Vega  two weeks, gyn will follow up in transvaginal [place] decide on the next step for menorrhagia    Discharge Diagnoses:  Active Hospital Problems   Diagnosis Date Noted   Symptomatic anemia 07/28/2021    Resolved Hospital Problems  No resolved problems to display.    Discharge Condition: stable  Diet recommendation: Regular diet  Filed Weights   07/28/21 1600  Weight: 117.9 kg    History of present illness: (Per admitting MD Dr. TMarcello Kathy TJAZYAH BUTSCHis a 45y.o. female with medical history significant of  Severe covid disease 1 years ago requiring trach and prolonged hospitalization, now still with tracheostomy that is capped with no O2 requirement.In addition to history of thyroid disease, obesity  and anemia for which patient states she received PRBC for one year ago. She re-calls no specific evaluation of her anemia at that time or diagnosis. Patient presents from pcp with symptoms of doe/sob/ and fatigue after lab results noted hgb of 4.8.  Patient denies any HA/confusion/chest pain/ dark stool,blood in stools, nose bleeds,palpitations or presyncope. Patient however does endorse intermittent periods of heavy menses and notes that over the last 2 month her periods have lasted around 7 days and and at times she has noted clots, she states this has been on going intermittently for over one year. She noted no prior history of bleeding disorder or family history of bleeding disorder.  Hospital Course:  Active Problems:   Symptomatic  anemia  Symptomatic anemia/acute blood loss anemia/menorrhagia -She received 2 units PRBC, IV iron infusion -Hemoglobin improved to 8, no ongoing bleeding -seen by GYN Dr. DJeannie Fendrecommended transvaginal ultrasound, Aygestin 5 mg twice a day, patient can discharge home follow-up with Gyn to decide on next step of action, detail please refer to Dr. EAmalia Haileyconsultation note  Fatty liver LFT within normal limit Incidental finding on abdominal ultrasound  Hypothyroidism Continue Synthroid supplement Tsh elevated at 144, suspect meds noncompliance, encourage compliance , f/u with pcp to repeat tsh in 4-6 weeks, patient made aware, she expressed understanding   Chronic lower extremity edema Negative D-dimer No proteinuria Liver ultrasound result as above  echocardiogram done, result pending PCP to follow-up on final report  Minor skin rash on face With elevated ESR/CRP ANA in process Follow-up with PCP  History of prolonged COVID hospitalization status post tracheostomy Tracheostomy capped Stable no acute issues She is closely followed by ENT  obesity Body mass index is 38.4 kg/m. Encourage weight loss   Procedures: Prbc transfusion Iron transfusion Transvaginal u[place] Consultations: gyn  Discharge Exam: BP 123/83   Pulse 77   Temp 98 F (36.7 C) (Oral)   Resp 10   Ht _0  (1.753 m)   Wt 117.9 kg   SpO2 99%   BMI 38.40 kg/m   General: NAD, + trach Cardiovascular: RRR Respiratory: normal respiratory effort  Discharge Instructions You were cared for by a hospitalist during your hospital stay. If you have any questions about your discharge medications or the care you received while you  were in the hospital after you are discharged, you can call the unit and asked to speak with the hospitalist on call if the hospitalist that took care of you is not available. Once you are discharged, your primary care physician will handle any further medical issues. Please note  that NO REFILLS for any discharge medications will be authorized once you are discharged, as it is imperative that you return to your primary care physician (or establish a relationship with a primary care physician if you do not have one) for your aftercare needs so that they can reassess your need for medications and monitor your lab values.  Discharge Instructions     Diet general   Complete by: As directed    Increase activity slowly   Complete by: As directed       Allergies as of 07/29/2021   No Known Allergies      Medication List     STOP taking these medications    predniSONE 10 MG (21) Tbpk tablet Commonly known as: STERAPRED UNI-PAK 21 TAB       TAKE these medications    albuterol (2.5 MG/3ML) 0.083% nebulizer solution Commonly known as: PROVENTIL Take 3 mLs (2.5 mg total) by nebulization every 6 (six) hours as needed for wheezing or shortness of breath.   budesonide 0.25 MG/2ML nebulizer solution Commonly known as: PULMICORT Take 2 mLs (0.25 mg total) by nebulization 2 (two) times daily.   ferrous sulfate 325 (65 FE) MG EC tablet Take 1 tablet (325 mg total) by mouth every other day. With breakfast What changed:  when to take this additional instructions   guaiFENesin 100 MG/5ML Soln Commonly known as: ROBITUSSIN Take 5 mLs (100 mg total) by mouth every 4 (four) hours as needed for cough or to loosen phlegm.   levothyroxine 125 MCG tablet Commonly known as: SYNTHROID Take 125 mcg by mouth daily before breakfast.   norethindrone 5 MG tablet Commonly known as: AYGESTIN Take 1 tablet (5 mg total) by mouth 2 (two) times daily for 21 days.   therapeutic multivitamin-minerals tablet Take 1 tablet by mouth daily.   Zinc 220 (50 Zn) MG Caps Take 1 capsule by mouth daily.       No Known Allergies  [place]up [place] [place] Follow up in 1 week(s).   Specialty: General Practice Why: hospital discharge follow up  , repeat cbc /bmp at follow up please follow up with your pcp for echocardiogam result and pending lab test result, please have your pcp repeat thyroid function in 4-6 weeks Contact information: [address] [place] [address] [phone]         Harlin Heys, MD Follow up in 2 week(s).   Specialties: Obstetrics and Gynecology, Radiology Why: heavy menses, please follow up with gyn regarding your transvaginal US result Contact information: [address] [place] [address] [phone]                  The results of significant diagnostics from this hospitalization (including imaging, microbiology, ancillary and laboratory) are listed below for reference.    Significant Diagnostic Studies: DG Chest 2 View  Result Date: 07/28/2021 CLINICAL DATA:  Shortness of breath. EXAM: CHEST - 2 VIEW COMPARISON:  September 09, 2020 FINDINGS: A tracheostomy tube is noted. A thin, radiopaque wire-like structure is seen overlying the superior mediastinum at the level of the clavicles. There is no evidence of acute  infiltrate, pleural effusion or pneumothorax. The cardiac silhouette is mildly enlarged and stable in appearance. The visualized skeletal structures are unremarkable. IMPRESSION: No active cardiopulmonary disease. Electronically Signed   By: Virgina Norfolk M.D.   On: 07/28/2021 17:04   US Abdomen Limited RUQ (LIVER/GB)  Result Date: 07/29/2021 CLINICAL DATA:  Anemia.  Elevated liver enzymes. EXAM: ULTRASOUND ABDOMEN LIMITED RIGHT UPPER QUADRANT COMPARISON:  Right upper quadrant ultrasound dated 08/14/2020. FINDINGS: Gallbladder: Cholecystectomy. Common bile duct: Diameter: 5 mm Liver: There is diffuse increased liver echogenicity most commonly seen in the setting of fatty infiltration. Superimposed inflammation or fibrosis is not excluded. Clinical correlation is recommended. Portal vein is patent on color Doppler imaging with normal  direction of blood flow towards the liver. Other: None. IMPRESSION: 1. Fatty liver. 2. Cholecystectomy. Electronically Signed   By: Anner Crete M.D.   On: 07/29/2021 02:47    Microbiology: Recent Results (from the past 240 hour(s))  Resp Panel by RT-PCR (Flu A&B, Covid) Nasopharyngeal Swab     Status: None   Collection Time: 07/29/21 12:32 AM   Specimen: Nasopharyngeal Swab; Nasopharyngeal(NP) swabs in vial transport medium  Result Value Ref Range Status   SARS Coronavirus 2 by RT PCR NEGATIVE NEGATIVE Final    Comment: (NOTE) SARS-CoV-2 target nucleic acids are NOT DETECTED.  The SARS-CoV-2 RNA is generally detectable in upper respiratory specimens during the acute phase of infection. The lowest concentration of SARS-CoV-2 viral copies this assay can detect is 138 copies/mL. A negative result does not preclude SARS-Cov-2 infection and should not be used as the sole basis for treatment or other patient management decisions. A negative result may occur with  improper specimen collection/handling, submission of specimen other than nasopharyngeal swab, presence of viral mutation(s) within the areas targeted by this assay, and inadequate number of viral copies(<138 copies/mL). A negative result must be combined with clinical observations, patient history, and epidemiological information. The expected result is Negative.  Fact Sheet for Patients:  EntrepreneurPulse.com.au  Fact Sheet for Healthcare Providers:  IncredibleEmployment.be  This test is no t yet approved or cleared by the [place] FDA and  has been authorized for detection and/or diagnosis of SARS-CoV-2 by FDA under an Emergency Use Authorization (EUA). This EUA will remain  in effect (meaning this test can be used) for the duration of the COVID-19 declaration under Section 564(b)(1) of the Act, 21 U.S.C.section 360bbb-3(b)(1), unless the authorization is terminated  or revoked  sooner.       Influenza A by PCR NEGATIVE NEGATIVE Final   Influenza B by PCR NEGATIVE NEGATIVE Final    Comment: (NOTE) The Xpert Xpress SARS-CoV-2/FLU/RSV plus assay is intended as an aid in the diagnosis of influenza from Nasopharyngeal swab specimens and should not be used as a sole basis for treatment. Nasal washings and aspirates are unacceptable for Xpert Xpress SARS-CoV-2/FLU/RSV testing.  Fact Sheet for Patients: EntrepreneurPulse.com.au  Fact Sheet for Healthcare Providers: IncredibleEmployment.be  This test is not yet approved or cleared by the [place] FDA and has been authorized for detection and/or diagnosis of SARS-CoV-2 by FDA under an Emergency Use Authorization (EUA). This EUA will remain in effect (meaning this test can be used) for the duration of the COVID-19 declaration under Section 564(b)(1) of the Act, 21 U.S.C. section 360bbb-3(b)(1), unless the authorization is terminated or revoked.  Performed at [facility], [address], [place], [place] [address]      Labs: Basic Metabolic Panel: Recent Labs  Lab 07/28/21 1605 07/29/21  0154  NA 136 137  K 4.0 3.7  CL 101 103  CO2 26 26  GLUCOSE 95 103*  BUN 14 14  CREATININE 1.10* 1.01*  CALCIUM 9.7 9.3   Liver Function Tests: Recent Labs  Lab 07/29/21 0154  AST 24  ALT 11  ALKPHOS 51  BILITOT 0.8  PROT 7.2  ALBUMIN 4.1   No results for input(s): LIPASE, AMYLASE in the last 168 hours. No results for input(s): AMMONIA in the last 168 hours. CBC: Recent Labs  Lab 07/28/21 1605 07/29/21 0154 07/29/21 1033 07/29/21 1443  WBC 5.7 5.9  --   --   HGB 4.5* 6.6* 8.5* 8.6*  HCT 16.2* 21.0* 26.4* 27.2*  MCV 70.7* 77.8*  --   --   PLT 267 242  --   --    Cardiac Enzymes: No results for input(s): CKTOTAL, CKMB, CKMBINDEX, TROPONINI in the last 168 hours. BNP: BNP (last 3 results) Recent Labs    08/13/20 1806 07/28/21 1507  07/29/21 0154  BNP 79.6 11.4 11.0    ProBNP (last 3 results) No results for input(s): PROBNP in the last 8760 hours.  CBG: No results for input(s): GLUCAP in the last 168 hours.     Signed:  Florencia Reasons MD, PhD, FACP  [facility] 07/29/2021, 4:51 PM

## 2021-07-29 NOTE — Progress Notes (Addendum)
Pt second unit of blood completed. Pt tolerated well. VSS. Pt requested snacks and blanket and was given. Will continue to monitor.  Update 0252: Pt hgb is at 6.6 after 2 units of blood transfusion. NP Randol Kern made aware. Will continue to monitor.  Update 0307: NP Randol Kern ordered 1 unit of blood. Blood transfusion started at 0335. Will continue to monitor.

## 2021-07-29 NOTE — ED Notes (Signed)
Pt assisted to bedside commode

## 2021-07-30 LAB — ANA W/REFLEX IF POSITIVE: Anti Nuclear Antibody (ANA): NEGATIVE

## 2021-07-30 IMAGING — DX DG CHEST 1V PORT
2 series · 2 of 2 positions shown · non-contrast
Comparison: 08/20/2020 prior chest radiographs

CLINICAL DATA: Acute respiratory failure

EXAM:
PORTABLE CHEST 1 VIEW

[chest pa (1 of 2)]
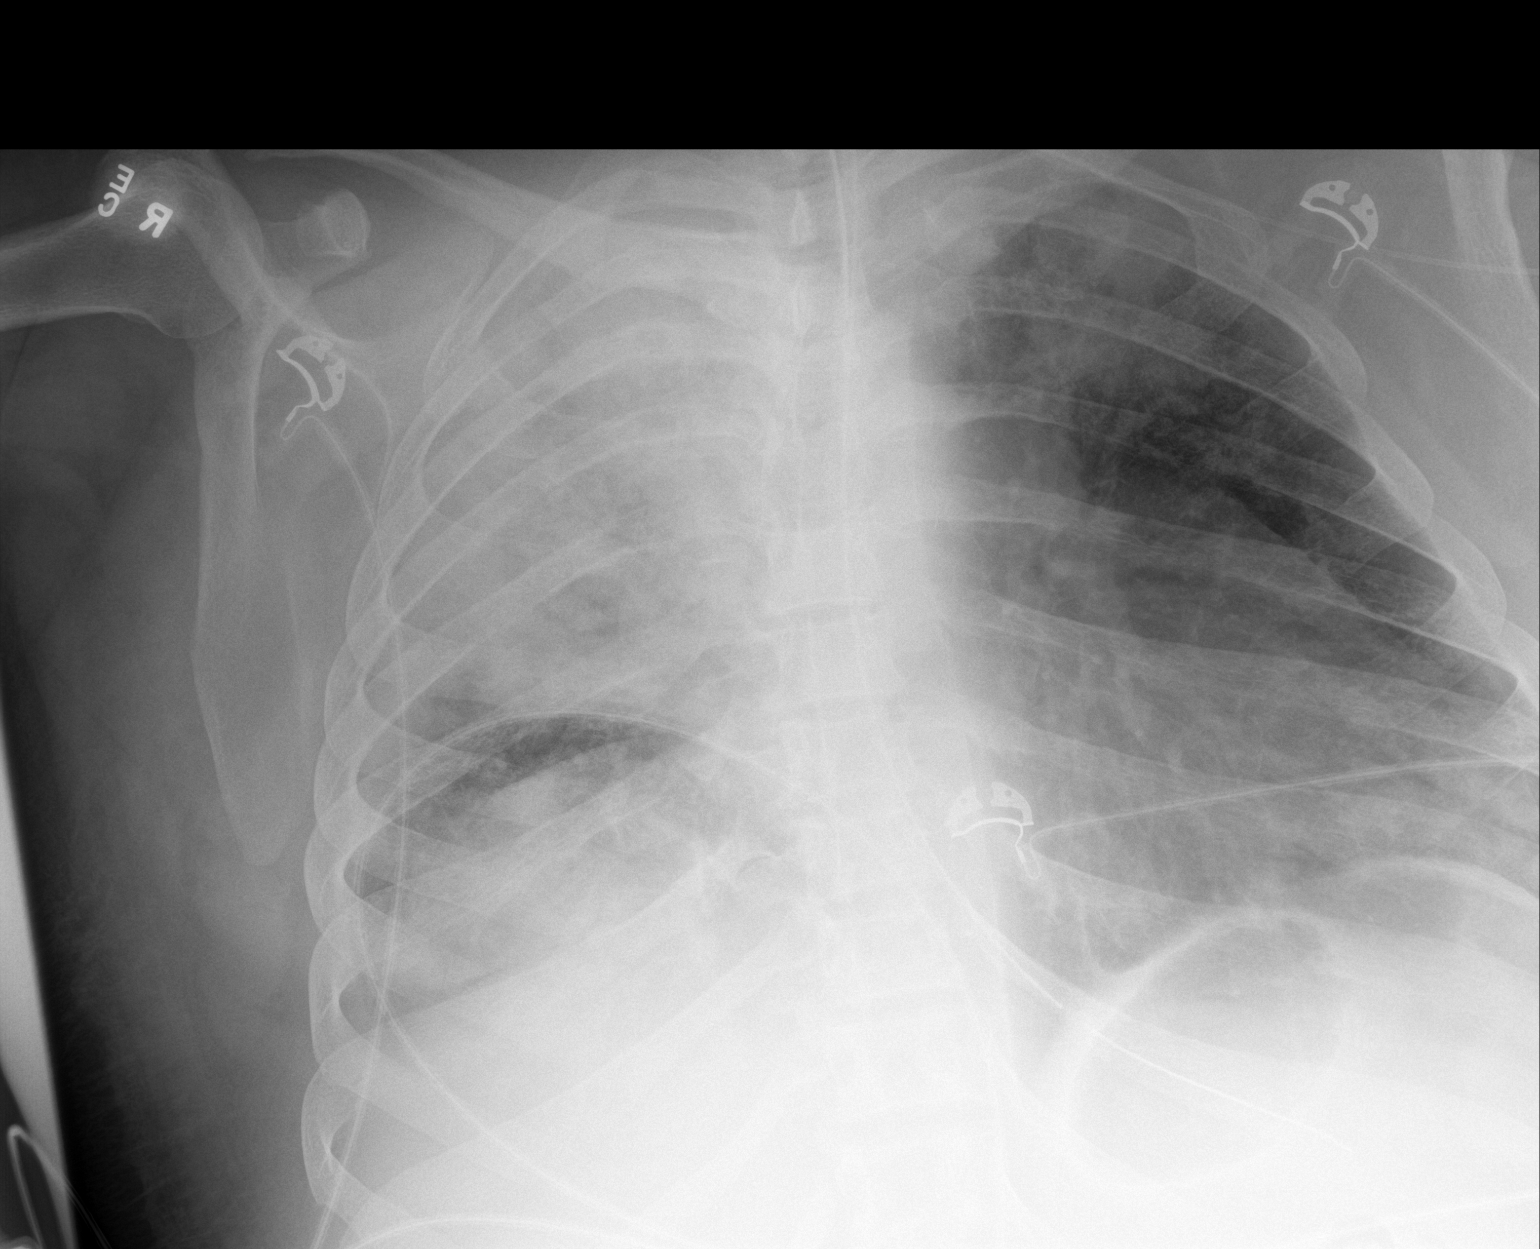

[chest pa (2 of 2)]
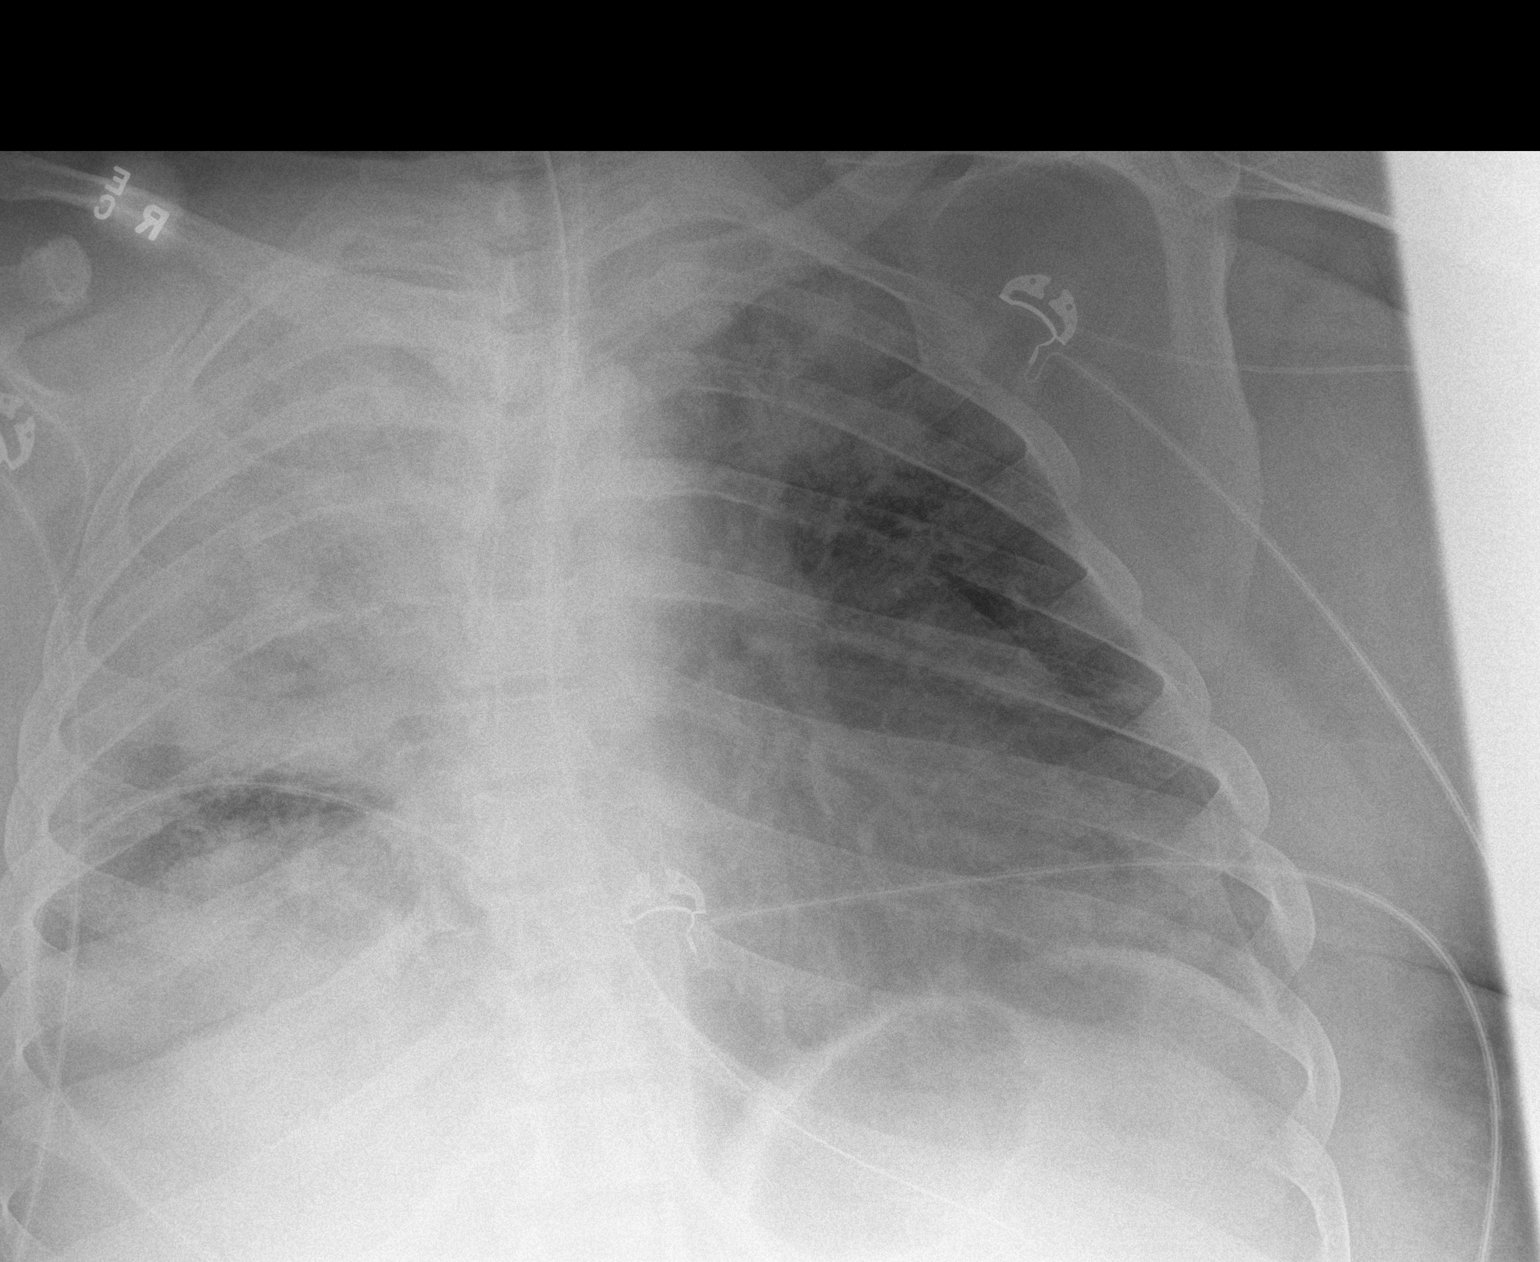

[2 of 2 positions shown; findings below may reference images not displayed]

FINDINGS: An endotracheal tube and NG tube are again noted.

Bilateral airspace opacities are again noted with increasing
airspace disease/consolidation within the RIGHT UPPER lung.

There is no evidence of pneumothorax.

No other significant changes are present.
IMPRESSION: Increasing RIGHT UPPER lung airspace disease/consolidation. No other
significant change.

## 2021-07-31 LAB — TYPE AND SCREEN
ABO/RH(D): O POS
Antibody Screen: NEGATIVE
Unit division: 0
Unit division: 0
Unit division: 0
Unit division: 0

## 2021-07-31 LAB — BPAM RBC
Blood Product Expiration Date: 202209302359
Blood Product Expiration Date: 202210012359
Blood Product Expiration Date: 202210012359
Blood Product Expiration Date: 202210012359
ISSUE DATE / TIME: 202208311755
ISSUE DATE / TIME: 202208312129
ISSUE DATE / TIME: 202209010325
Unit Type and Rh: 5100
Unit Type and Rh: 5100
Unit Type and Rh: 5100
Unit Type and Rh: 5100

## 2021-07-31 LAB — PREPARE RBC (CROSSMATCH)

## 2021-08-02 IMAGING — DX DG CHEST 1V PORT
1 series · 2 of 2 positions shown · non-contrast
Comparison: 08/22/2020

CLINICAL DATA: Acute respiratory failure with hypoxia

EXAM:
PORTABLE CHEST 1 VIEW

[Series 1: chest ap · 0.14mm/px · 2 of 2 slices shown]
[im 1/2]
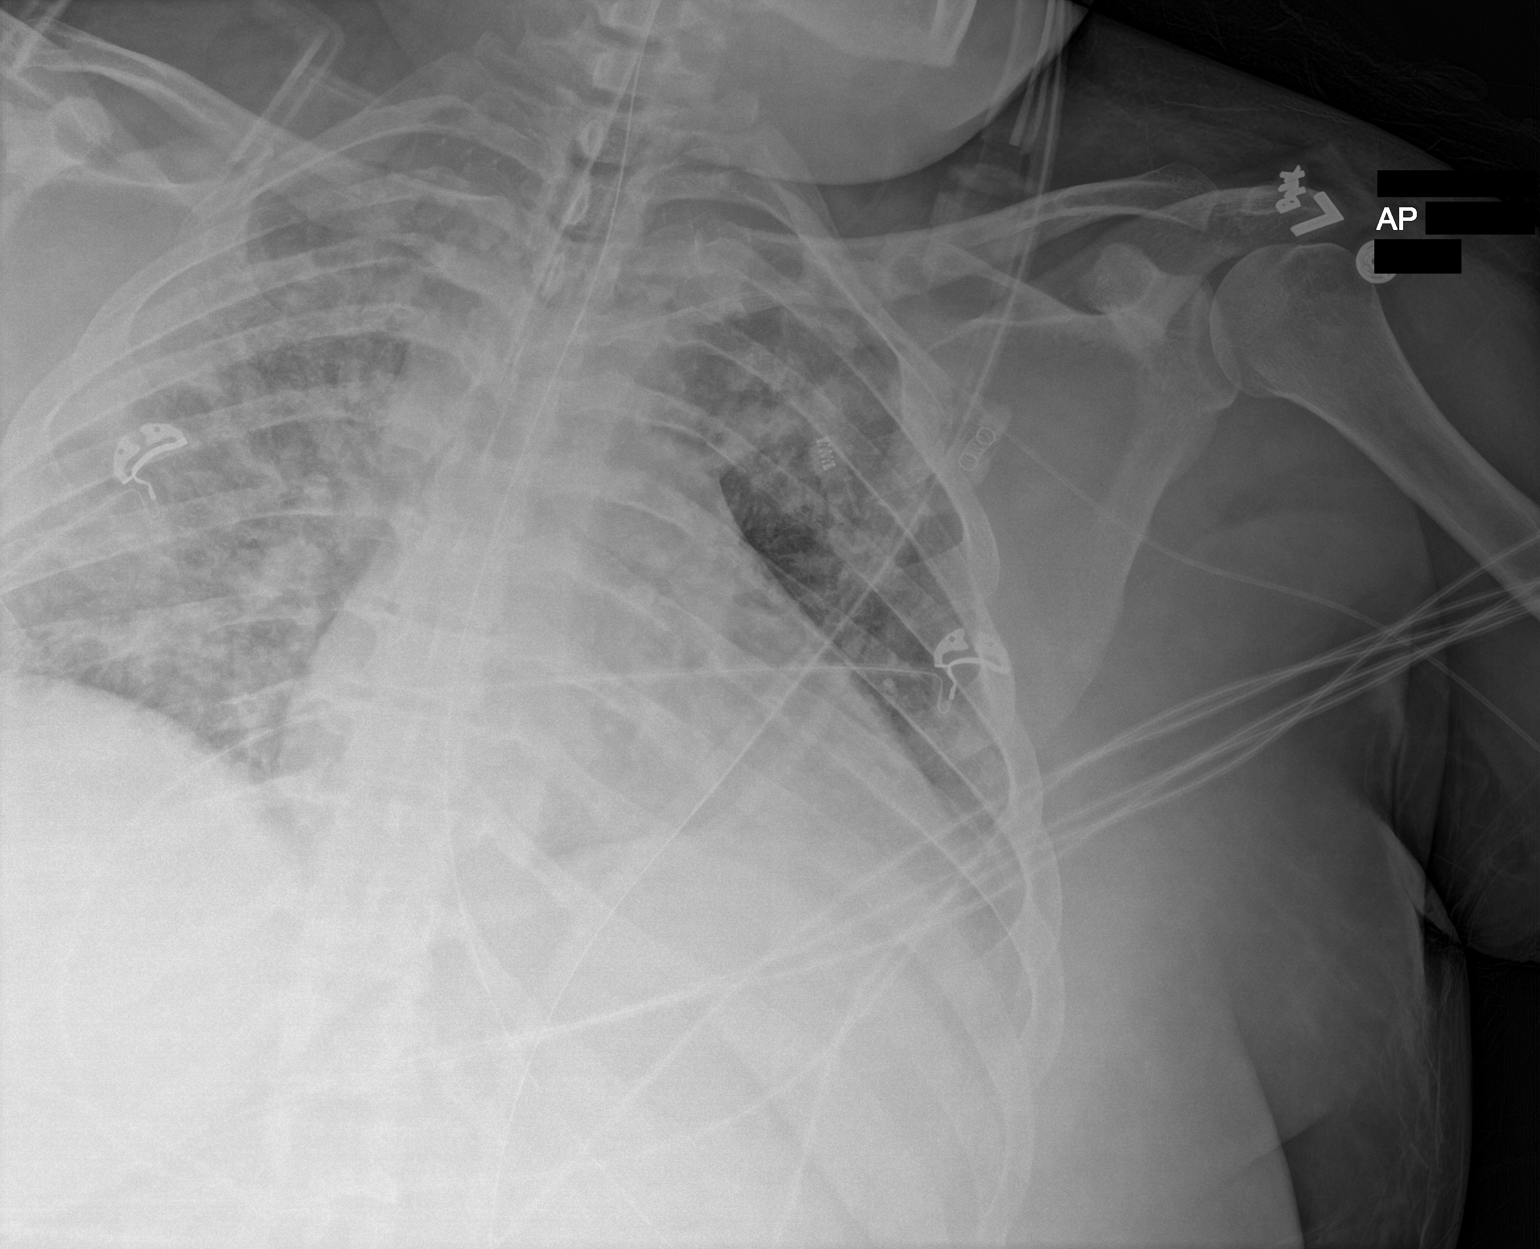
[im 2/2]
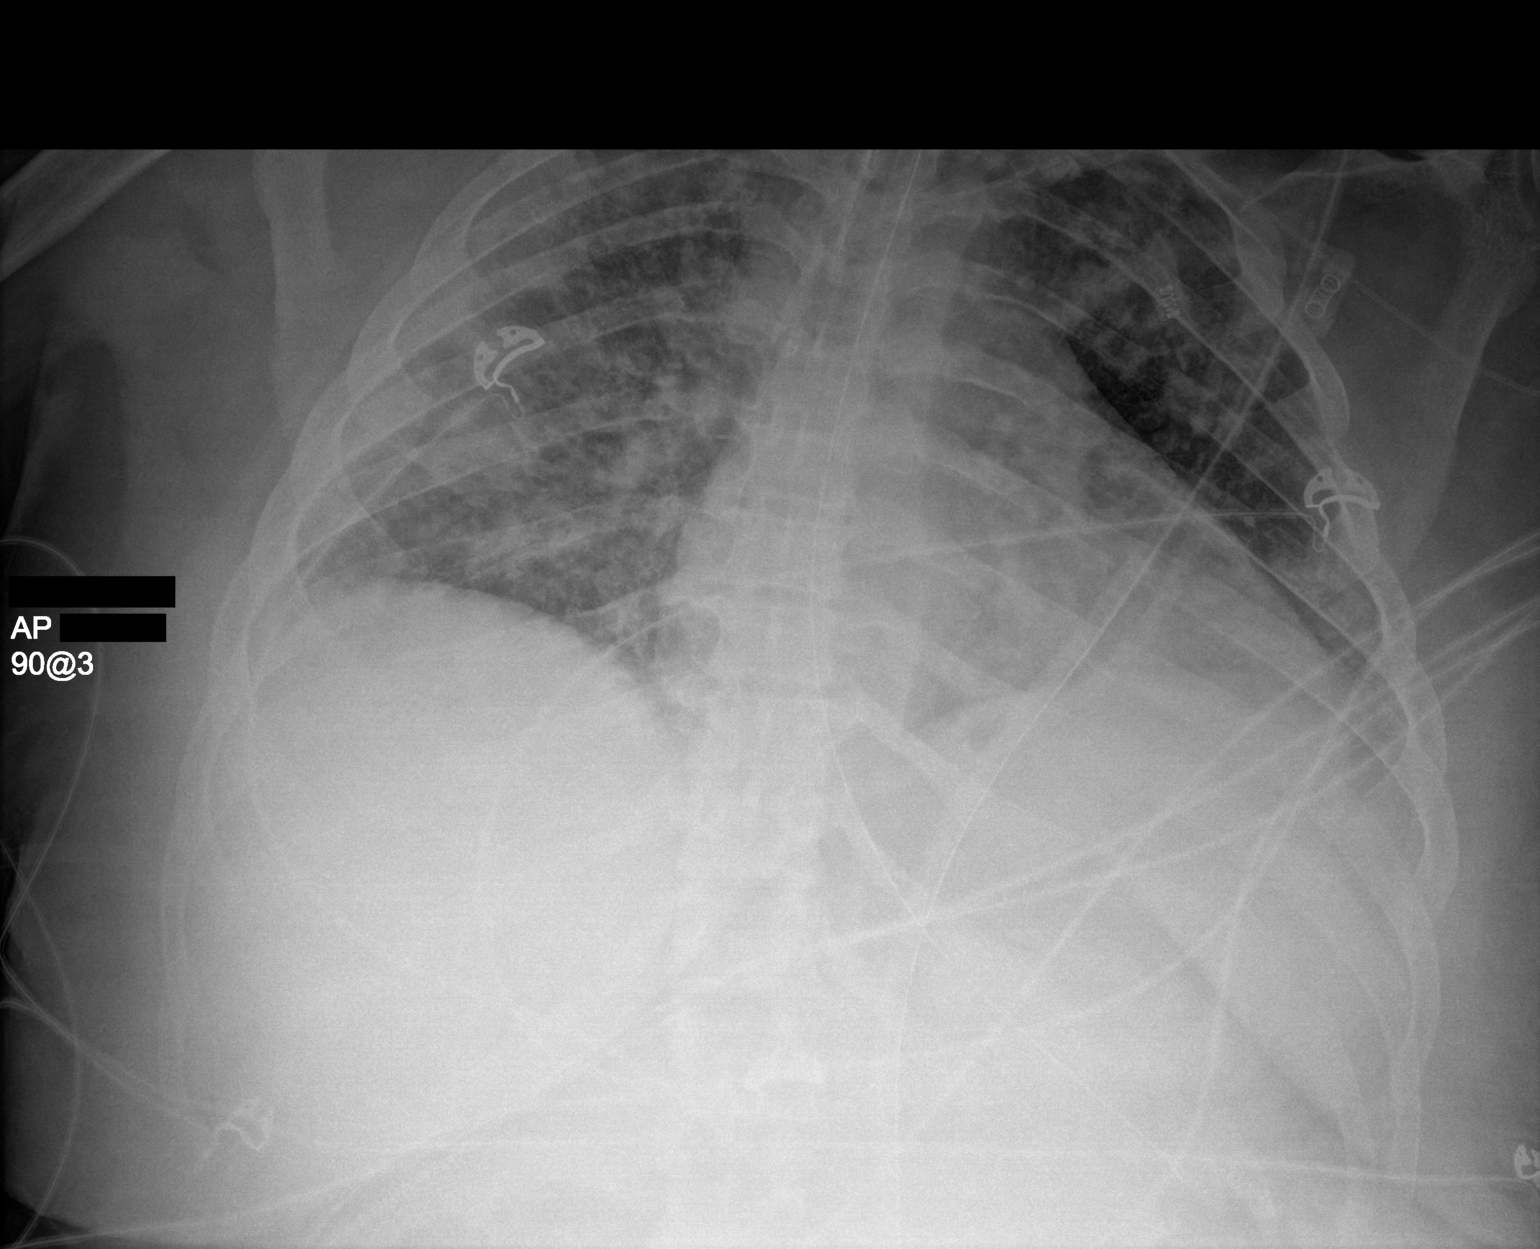

[2 of 2 positions shown; findings below may reference images not displayed]

FINDINGS: Enteric and endotracheal tubes are unchanged in position. Shallow
inspiration. Mild cardiac enlargement. Bilateral pulmonary
infiltrates. Mild improvement since previous study. No pleural
effusions. No pneumothorax. Mediastinal contours appear intact.
IMPRESSION: Bilateral pulmonary infiltrates with improvement since previous
study.

## 2021-08-06 ENCOUNTER — Other Ambulatory Visit: Payer: Self-pay

## 2021-08-06 ENCOUNTER — Ambulatory Visit (INDEPENDENT_AMBULATORY_CARE_PROVIDER_SITE_OTHER): Payer: Medicaid Other | Admitting: Pulmonary Disease

## 2021-08-06 ENCOUNTER — Encounter: Payer: Self-pay | Admitting: Pulmonary Disease

## 2021-08-06 VITALS — BP 110/64 | HR 66 | Temp 97.2°F | Ht 69.0 in | Wt 263.4 lb

## 2021-08-06 DIAGNOSIS — I429 Cardiomyopathy, unspecified: Secondary | ICD-10-CM

## 2021-08-06 DIAGNOSIS — J8410 Pulmonary fibrosis, unspecified: Secondary | ICD-10-CM

## 2021-08-06 DIAGNOSIS — J45909 Unspecified asthma, uncomplicated: Secondary | ICD-10-CM | POA: Diagnosis not present

## 2021-08-06 DIAGNOSIS — J398 Other specified diseases of upper respiratory tract: Secondary | ICD-10-CM | POA: Diagnosis not present

## 2021-08-06 DIAGNOSIS — E039 Hypothyroidism, unspecified: Secondary | ICD-10-CM | POA: Diagnosis not present

## 2021-08-06 DIAGNOSIS — E669 Obesity, unspecified: Secondary | ICD-10-CM

## 2021-08-06 DIAGNOSIS — G4736 Sleep related hypoventilation in conditions classified elsewhere: Secondary | ICD-10-CM

## 2021-08-06 DIAGNOSIS — E66812 Obesity, class 2: Secondary | ICD-10-CM

## 2021-08-06 DIAGNOSIS — Z8616 Personal history of COVID-19: Secondary | ICD-10-CM

## 2021-08-06 NOTE — Patient Instructions (Signed)
You will need a referral to the heart doctor and also to the thyroid specialist.  I will send a note to the [facility] so they know what referrals need to be made.  Continue follow-up with Dr. Beverely Risen as you are doing.  We will see you in follow-up in 3 months time call sooner should any new problems arise.

## 2021-08-13 HISTORY — PX: LARYNGOSCOPY: SHX5203

## 2021-08-19 ENCOUNTER — Ambulatory Visit: Payer: Medicaid Other | Admitting: Adult Health

## 2021-09-15 ENCOUNTER — Encounter: Payer: Self-pay | Admitting: Obstetrics and Gynecology

## 2021-09-15 ENCOUNTER — Other Ambulatory Visit: Payer: Self-pay

## 2021-09-15 ENCOUNTER — Ambulatory Visit (INDEPENDENT_AMBULATORY_CARE_PROVIDER_SITE_OTHER): Payer: Medicaid Other | Admitting: Obstetrics and Gynecology

## 2021-09-15 ENCOUNTER — Other Ambulatory Visit (HOSPITAL_COMMUNITY)
Admission: RE | Admit: 2021-09-15 | Discharge: 2021-09-15 | Disposition: A | Discharge status: Home / Self Care | Payer: Medicaid Other | Source: Ambulatory Visit | Attending: Obstetrics and Gynecology | Admitting: Obstetrics and Gynecology

## 2021-09-15 VITALS — BP 121/81 | HR 105 | Ht 69.0 in | Wt 262.3 lb

## 2021-09-15 DIAGNOSIS — R9389 Abnormal findings on diagnostic imaging of other specified body structures: Secondary | ICD-10-CM | POA: Diagnosis not present

## 2021-09-15 DIAGNOSIS — D219 Benign neoplasm of connective and other soft tissue, unspecified: Secondary | ICD-10-CM

## 2021-09-15 MED ORDER — NORETHINDRONE 0.35 MG PO TABS: 1.0000 | ORAL_TABLET | Freq: Every day | ORAL | 0 refills | Status: DC

## 2021-09-15 MED ORDER — MEDROXYPROGESTERONE ACETATE 10 MG PO TABS: 10.0000 mg | ORAL_TABLET | Freq: Every day | ORAL | 2 refills | Status: DC

## 2021-09-15 NOTE — Progress Notes (Signed)
HPI:      [name] is a 45 y.o. [phone] who LMP was Patient's last menstrual period was 08/16/2021 (approximate).  Subjective:   She presents today with complaint of heavy menstrual bleeding over the last 3 to 4 months.  She states that the periods are approximately 10 days long and very heavy.  She recently had an ultrasound showing a thickened endometrium, a small fibroid and an ovarian cyst. Patient states that her menstrual periods were normal regular menses during 2021 and only in the last 3 to 4 months have they become very heavy. She has a tubal ligation for birth control.    Hx: The following portions of the patient's history were reviewed and updated as appropriate:             She  has a past medical history of Anemia, Cervical cancer ([place]), and Thyroid disease. She does not have any pertinent problems on file. She  has a past surgical history that includes Leg Surgery; Cholecystectomy; Tracheostomy tube placement (N/A, 09/04/2020); Cervical biopsy w/ loop electrode excision; and Cesarean section. Her family history is not on file. She  reports that she quit smoking about 22 years ago. Her smoking use included cigarettes. She has a 2.50 pack-year smoking history. She has never used smokeless tobacco. She reports that she does not drink alcohol and does not use drugs. She has a current medication list which includes the following prescription(s): albuterol, budesonide, ferrous sulfate, levothyroxine, medroxyprogesterone, norethindrone, therapeutic multivitamin-minerals, zinc, and norethindrone. She has No Known Allergies.       Review of Systems:  Review of Systems  Constitutional: Denied constitutional symptoms, night sweats, recent illness, fatigue, fever, insomnia and weight loss.  Eyes: Denied eye symptoms, eye pain, photophobia, vision change and visual disturbance.  Ears/Nose/Throat/Neck: Denied ear, nose, throat or neck symptoms, hearing loss, nasal discharge, sinus  congestion and sore throat.  Cardiovascular: Denied cardiovascular symptoms, arrhythmia, chest pain/pressure, edema, exercise intolerance, orthopnea and palpitations.  Respiratory: Denied pulmonary symptoms, asthma, pleuritic pain, productive sputum, cough, dyspnea and wheezing.  Gastrointestinal: Denied, gastro-esophageal reflux, melena, nausea and vomiting.  Genitourinary: See HPI for additional information.  Musculoskeletal: Denied musculoskeletal symptoms, stiffness, swelling, muscle weakness and myalgia.  Dermatologic: Denied dermatology symptoms, rash and scar.  Neurologic: Denied neurology symptoms, dizziness, headache, neck pain and syncope.  Psychiatric: Denied psychiatric symptoms, anxiety and depression.  Endocrine: Denied endocrine symptoms including hot flashes and night sweats.   Meds:   Current Outpatient Medications on File Prior to Visit  Medication Sig Dispense Refill   albuterol (PROVENTIL) (2.5 MG/3ML) 0.083% nebulizer solution Take 3 mLs (2.5 mg total) by nebulization every 6 (six) hours as needed for wheezing or shortness of breath. 360 mL 11   budesonide (PULMICORT) 0.25 MG/2ML nebulizer solution Take 2 mLs (0.25 mg total) by nebulization 2 (two) times daily. 120 mL 11   ferrous sulfate 325 (65 FE) MG EC tablet Take 1 tablet (325 mg total) by mouth every other day. With breakfast     levothyroxine (SYNTHROID, LEVOTHROID) 125 MCG tablet Take 125 mcg by mouth daily before breakfast.     therapeutic multivitamin-minerals (THERAGRAN-M) tablet Take 1 tablet by mouth daily. 130 tablet 2   Zinc 220 (50 Zn) MG CAPS Take 1 capsule by mouth daily. 90 capsule 0   norethindrone (AYGESTIN) 5 MG tablet Take 1 tablet (5 mg total) by mouth 2 (two) times daily for 21 days. 42 tablet 0   No current facility-administered medications on file  prior to visit.      Objective:     Vitals:   09/15/21 0741  BP: 121/81  Pulse: (!) 105   Filed Weights   09/15/21 0741  Weight: 262 lb  4.8 oz (119 kg)              Physical examination   Pelvic:   Vulva: Normal appearance.  No lesions.  Vagina: No lesions or abnormalities noted.  Support: Normal pelvic support.  Urethra No masses tenderness or scarring.  Meatus Normal size without lesions or prolapse.  Cervix: Normal appearance.  No lesions.  Anus: Normal exam.  No lesions.  Perineum: Normal exam.  No lesions.        Bimanual   Uterus: Normal size.  Non-tender.  Mobile.  AV.  Adnexae: No masses.  Non-tender to palpation.  Cul-de-sac: Negative for abnormality.   Endometrial Biopsy After discussion with the patient regarding her abnormal uterine bleeding I recommended that she proceed with an endometrial biopsy for further diagnosis. The risks, benefits, alternatives, and indications for an endometrial biopsy were discussed with the patient in detail. She understood the risks including infection, bleeding, cervical laceration and uterine perforation.  Verbal consent was obtained.   PROCEDURE NOTE:  Vacurette endometrial biopsy was performed using aseptic technique with iodine preparation.  The uterus was sounded to a length of 10 cm.  The procedure was very difficult because of a very long vagina and the cervix was very difficult to visualize even with a long speculum.  Adequate sampling was obtained with minimal blood loss.  The patient tolerated the procedure well.  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 Thickened endometrium   2. Fibroids     Likely causing her heavy bleeding.   Plan:            1.  Endometrial biopsy performed will await results  2.  Withdrawal bleed using progesterone/Provera  3.  1 month of Micronor  4.  Follow-up for discussion regarding expectant management versus BV versus hormonal cycling to control heavy menstrual bleeding. Orders No orders of the defined types were placed in this encounter.    Meds ordered this encounter  Medications   medroxyPROGESTERone (PROVERA) 10 MG tablet    Sig: Take 1 tablet (10 mg total) by mouth daily. Use for ten days    Dispense:  10 tablet    Refill:  2   norethindrone (MICRONOR) 0.35 MG tablet    Sig: Take 1 tablet (0.35 mg total) by mouth daily.    Dispense:  28 tablet    Refill:  0      F/U  Return in about 6 weeks (around 10/27/2021). I spent 51 minutes involved in the care of this patient preparing to see the patient by obtaining and reviewing her medical history (including labs, imaging tests and prior procedures), documenting clinical information in  the electronic health record (EHR), counseling and coordinating care plans, writing and sending prescriptions, ordering tests or procedures and in direct communicating with the patient and medical staff discussing pertinent items from her history and physical exam.  Finis Bud, M.D. 09/15/2021 8:59 AM

## 2021-09-15 NOTE — Addendum Note (Signed)
Addended by: Chilton Greathouse on: 09/15/2021 11:25 AM   Modules accepted: Orders

## 2021-09-16 LAB — SURGICAL PATHOLOGY

## 2021-09-17 ENCOUNTER — Encounter: Payer: Self-pay | Admitting: Pulmonary Disease

## 2021-09-17 NOTE — Progress Notes (Signed)
Subjective:    Patient ID: Kathy Vega, female    DOB: 1976-04-28, 45 y.o.   MRN: [id number] Chief Complaint  Patient presents with   Follow-up    Sob with exertion and mild wheezing.      HPI Kathy Vega is a 45 year old former smoker who follows here for the issue of shortness of breath.  This is a scheduled visit.  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  The patient underwent laryngoscopy and rigid bronchoscopy by Dr. Beverely Risen at [facility] on 20 May.  She is to undergo further revision and potential dilation of her tracheal stenosis on 16 September.  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  Since then she has noted some increased shortness of breath and wheezing.  She continues to have poor tolerance of her Passy-Muir valve tolerating it only for about an hour before she gets short of breath.  She states that she is using her nebulizers and nocturnal oxygen.  She has been instructed to use albuterol 4 times a day and Pulmicort twice a day.  She uses this via nebulizer with trach collar.   She has not had any fevers, chills or sweats.  Cough is productive of white sputum which she freely expectorates through her trach.  She is phonating well with a Passy-Muir valve but as noted has limited time tolerating it.  She voices no other complaint today.  She continues follow-up with Dr. Beverely Risen as noted above.  She had a TSH done recently is highly elevated.  She is on thyroid supplementation.  This is likely impacting on her fatigue.  She has also had issues with anemia which are being worked up.  She also had an echocardiogram that showed EF of 45 to 50% with decrease LV function.     Review of Systems A 10 point review of systems was performed and it is as noted above otherwise negative.  Patient Active Problem List    Diagnosis Date Noted   Symptomatic anemia 07/28/2021   Asthma 03/23/2021   Tracheal stenosis 03/23/2021   Intubation of airway performed without difficulty    Pain    Tracheostomy in place [facility]) 09/18/2020   Acute respiratory failure ([place])    Pneumonia due to COVID-19 virus 08/13/2020   Acute renal failure (ARF) ([place]) 08/13/2020   Elevated liver enzymes 08/13/2020   CAP (community acquired pneumonia) 08/13/2020   Acute encephalopathy 08/13/2020   Hypokalemia 08/13/2020   Social History   Tobacco Use   Smoking status: Former    Packs/day: 0.50    Years: 5.00    Pack years: 2.50    Types: Cigarettes    Quit date: 2000    Years since quitting: 22.8   Smokeless tobacco: Never  Substance Use Topics   Alcohol use: No   No Known Allergies  Current Meds  Medication Sig   albuterol (PROVENTIL) (2.5 MG/3ML) 0.083% nebulizer solution Take 3 mLs (2.5 mg total) by nebulization every 6 (six) hours as needed for wheezing or shortness of breath.   budesonide (PULMICORT) 0.25 MG/2ML nebulizer solution Take 2 mLs (0.25 mg total) by nebulization 2 (two) times daily.   ferrous sulfate 325 (65 FE) MG EC tablet Take 1 tablet (325 mg total) by mouth every other day. With breakfast   levothyroxine (SYNTHROID, LEVOTHROID) 125 MCG tablet Take 125 mcg by mouth daily before breakfast.  norethindrone (AYGESTIN) 5 MG tablet Take 1 tablet (5 mg total) by mouth 2 (two) times daily for 21 days.   therapeutic multivitamin-minerals (THERAGRAN-M) tablet Take 1 tablet by mouth daily.   Zinc 220 (50 Zn) MG CAPS Take 1 capsule by mouth daily.   [DISCONTINUED] guaiFENesin (ROBITUSSIN) 100 MG/5ML SOLN Take 5 mLs (100 mg total) by mouth every 4 (four) hours as needed for cough or to loosen phlegm.    There is no immunization history on file for this patient.     Objective:   Physical Exam BP 110/64 (BP Location: Left Arm, Cuff Size: Large)   Pulse 66   Temp (!) 97.2 F (36.2 C) (Temporal)   Ht 5\' 9"   (1.753 m)   Wt 263 lb 6.4 oz (119.5 kg)   SpO2 98%   BMI 38.90 kg/m  GENERAL: Obese woman, no acute distress, tracheostomy in place.  He has a Passy-Muir valve and able to speak without difficulty.  Uses trach collar for supplemental oxygen at night. HEAD: Normocephalic, atraumatic.  EYES: Pupils equal, round, reactive to light.  No scleral icterus.  MOUTH: Nose/mouth/throat not examined due to masking requirements for COVID 19. NECK: Supple. No lymphadenopathy.  Tracheostomy in place as above with Passy-Muir valve. No JVD.  Trachea midline. PULMONARY: Good air entry bilaterally.  Coarse breath sounds noted.  No wheezes noted today.  Otherwise no other adventitious sounds CARDIOVASCULAR: S1 and S2. Regular rate and rhythm.  ABDOMEN: Obese otherwise unremarkable. MUSCULOSKELETAL: No joint deformity, no clubbing, no edema.  NEUROLOGIC: No overt focal deficit. SKIN: Intact,warm,dry. PSYCH: Mood and behavior normal.       Assessment & Plan:      ICD-10-CM   1. Persistent asthma without complication, unspecified asthma severity  J45.909    Continue neb treatments    2. Tracheal stenosis  J39.8    Management per Dr. Beverely Risen at [facility] Tracheostomy present    3. 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 Hypothyroidism (acquired)  E03.9    Recommend referral to endocrinology Defer to primary care    5. Cardiomyopathy, unspecified type ([place])  I42.9    Recent echo shows decreased LVEF 45% Recommend cardiac evaluation    6. Postinflammatory pulmonary fibrosis (HCC)  J84.10     7. Personal history of COVID-19  Z86.16    Postinflammatory pulmonary fibrosis due to this    8. Obesity, Class II, BMI 35-39.9, isolated (see actual BMI)  E66.9    Weight loss is recommended      Despite her current issues Kathy Vega is actually doing fairly well, she had a very complicated GDJME-26 course with  significant respiratory failure that necessitated tracheostomy.  She has had significant subglottic stenosis that is now being handled by Dr. Beverely Risen.  Continue nebulization treatments and nocturnal oxygen as above.  Her issues with dyspnea may be further aggravated by significant hypothyroidism and decreased LVEF noted on recent echocardiogram.  Recommend referral to endocrinology and cardiology.  These referrals will need to come from her primary care provider at the [facility] given her Medicaid status.  We will see her in follow-up in 3 months time she is to contact us prior to that time should any new difficulties arise.  Renold Don, MD Advanced Bronchoscopy PCCM [facility]    *This note was dictated using voice recognition software/Dragon.  Despite  best efforts to proofread, errors can occur which can change the meaning.  Any change was purely unintentional.

## 2021-10-26 ENCOUNTER — Other Ambulatory Visit: Payer: Self-pay

## 2021-10-26 ENCOUNTER — Encounter: Payer: Self-pay | Admitting: Intensive Care

## 2021-10-26 ENCOUNTER — Emergency Department: Payer: Medicaid Other

## 2021-10-26 ENCOUNTER — Emergency Department
Admission: EM | Admit: 2021-10-26 | Discharge: 2021-10-26 | Disposition: A | Discharge status: Home / Self Care | Payer: Medicaid Other | Attending: Emergency Medicine | Admitting: Emergency Medicine

## 2021-10-26 DIAGNOSIS — Z85828 Personal history of other malignant neoplasm of skin: Secondary | ICD-10-CM | POA: Insufficient documentation

## 2021-10-26 DIAGNOSIS — Z87891 Personal history of nicotine dependence: Secondary | ICD-10-CM | POA: Diagnosis not present

## 2021-10-26 DIAGNOSIS — J45909 Unspecified asthma, uncomplicated: Secondary | ICD-10-CM | POA: Insufficient documentation

## 2021-10-26 DIAGNOSIS — M62838 Other muscle spasm: Secondary | ICD-10-CM | POA: Diagnosis not present

## 2021-10-26 DIAGNOSIS — U071 COVID-19: Secondary | ICD-10-CM | POA: Insufficient documentation

## 2021-10-26 DIAGNOSIS — Z8616 Personal history of COVID-19: Secondary | ICD-10-CM | POA: Diagnosis not present

## 2021-10-26 DIAGNOSIS — R051 Acute cough: Secondary | ICD-10-CM

## 2021-10-26 DIAGNOSIS — R059 Cough, unspecified: Secondary | ICD-10-CM | POA: Diagnosis present

## 2021-10-26 MED ORDER — LIDOCAINE 5 % EX PTCH: 1.0000 | MEDICATED_PATCH | Freq: Two times a day (BID) | CUTANEOUS | 0 refills | Status: DC

## 2021-10-26 NOTE — Discharge Instructions (Signed)
Please take Tylenol and ibuprofen/Advil for your pain.  It is safe to take them together, or to alternate them every few hours.  Take up to 1000mg  of Tylenol at a time, up to 4 times per day.  Do not take more than 4000 mg of Tylenol in 24 hours.  For ibuprofen, take 400-600 mg, 4-5 times per day.  Please use lidocaine patches and your site of pain.  Apply 1 patch at a time, leave on for 12 hours, then remove for 12 hours.  12 hours on, 12 hours off.  Do not apply more than 1 patch at a time.

## 2021-10-26 NOTE — ED Notes (Signed)
Pt left before signing for discharge inst.

## 2021-10-26 NOTE — ED Triage Notes (Signed)
Patient reports being diagnosed with covid X4 days ago at [facility]. Patient has trache and reports pain all around neck. Also c/o coughing up streaks of blood

## 2021-10-26 NOTE — ED Provider Notes (Signed)
  Emergency Medicine Provider Triage Evaluation Note  Kathy Vega , a 45 y.o.female,  was evaluated in triage.  Pt complains of trach issue.  Patient states that she was diagnosed with COVID approximately 4 days ago.  She states that she is a little short of breath but otherwise feels okay.  Her concern today is she has been getting blood backed up in her trach the past few days which is unusual for her.  Patient states had this trach placed last year after she experienced complications from her last COVID infection.  Denies fever/chills, chest pain, abdominal pain, or urinary symptoms.   Review of Systems  Positive: Mild SOB, bleeding in her trach Negative: Denies fever, chest pain, vomiting  Physical Exam  There were no vitals filed for this visit. Gen:   Awake, no distress   Resp:  Normal effort  MSK:   Moves extremities without difficulty  Other:    Medical Decision Making  Given the patient's initial medical screening exam, the following diagnostic evaluation has been ordered. The patient will be placed in the appropriate treatment space, once one is available, to complete the evaluation and treatment. I have discussed the plan of care with the patient and I have advised the patient that an ED physician or mid-level practitioner will reevaluate their condition after the test results have been received, as the results may give them additional insight into the type of treatment they may need.    Diagnostics: CXR  Treatments: none immediately   Teodoro Spray, PA 10/26/21 1350    Vladimir Crofts, MD 10/26/21 [phone]

## 2021-10-26 NOTE — ED Provider Notes (Signed)
[facility] Emergency Department Provider Note ____________________________________________   Event Date/Time   First MD Initiated Contact with Patient 10/26/21 1530     (approximate)  I have reviewed the triage vital signs and the nursing notes.  HISTORY  Chief Complaint Covid Positive and Neck Pain   HPI Kathy Vega is a 45 y.o. femalewho presents to the ED for evaluation of covid illness.   Chart review indicates morbidly obese patient who is trach dependent and follows with [facility].  Trach placed due to COVID infection 1 year ago.  Patient presents to the ED for evaluation of neck pain and pink streaking to her sputum with cough.  Reports that she has been feeling "sick" for the past 4-5 days with generalized myalgias, malaise, subjective chills and increased cough from baseline.  She reports waking up with a "crick in her neck" and has had increasing left-sided paraspinal neck pain, worse when twisting her head her head to the left.  Further reports increased cough from baseline, sometimes with pink streaking.  No frank mopped assist and no clots.  This is inconsistent.  Denies chest pain, syncope, abdominal pain  Past Medical History:  Diagnosis Date   Anemia    Cervical cancer ([place])    Thyroid disease     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   Intubation of airway performed without difficulty    Pain    Tracheostomy in place [facility]) 09/18/2020   Acute respiratory failure ([place])    Pneumonia due to COVID-19 virus 08/13/2020   Acute renal failure (ARF) ([place]) 08/13/2020   Elevated liver enzymes 08/13/2020   CAP (community acquired pneumonia) 08/13/2020   Acute encephalopathy 08/13/2020   Hypokalemia 08/13/2020    Past Surgical History:  Procedure Laterality Date   CERVICAL BIOPSY  W/ LOOP ELECTRODE EXCISION     CESAREAN SECTION      CHOLECYSTECTOMY     LEG SURGERY     TRACHEOSTOMY TUBE PLACEMENT N/A 09/04/2020   Procedure: TRACHEOSTOMY;  Surgeon: Beverly Gust, MD;  Location: [facility] ORS;  Service: ENT;  Laterality: N/A;    Prior to Admission medications   Medication Sig Start Date End Date Taking? Authorizing Provider  lidocaine (LIDODERM) 5 % Place 1 patch onto the skin every 12 (twelve) hours. Remove & Discard patch within 12 hours or as directed by MD 10/26/21 10/26/22 Yes Vladimir Crofts, MD  albuterol (PROVENTIL) (2.5 MG/3ML) 0.083% nebulizer solution Take 3 mLs (2.5 mg total) by nebulization every 6 (six) hours as needed for wheezing or shortness of breath. 12/17/20   Martyn Ehrich, NP  budesonide (PULMICORT) 0.25 MG/2ML nebulizer solution Take 2 mLs (0.25 mg total) by nebulization 2 (two) times daily. 12/17/20 12/17/21  Martyn Ehrich, NP  ferrous sulfate 325 (65 FE) MG EC tablet Take 1 tablet (325 mg total) by mouth every other day. With breakfast 07/29/21   Florencia Reasons, MD  levothyroxine (SYNTHROID, LEVOTHROID) 125 MCG tablet Take 125 mcg by mouth daily before breakfast.    [provider]  medroxyPROGESTERone (PROVERA) 10 MG tablet Take 1 tablet (10 mg total) by mouth daily. Use for ten days 09/15/21   Harlin Heys, MD  norethindrone (AYGESTIN) 5 MG tablet Take 1 tablet (5 mg total) by mouth 2 (two) times daily for 21 days. 07/29/21 08/19/21  Florencia Reasons, MD  norethindrone (MICRONOR) 0.35 MG tablet Take 1  tablet (0.35 mg total) by mouth daily. 09/15/21   Harlin Heys, MD  Zinc 220 (50 Zn) MG CAPS Take 1 capsule by mouth daily. 10/02/20   Lorella Nimrod, MD    Allergies Patient has no known allergies.  Family History  Problem Relation Age of Onset   Breast cancer Neg Hx    Ovarian cancer Neg Hx     Social History Social History   Tobacco Use   Smoking status: Former    Packs/day: 0.50    Years: 5.00    Pack years: 2.50    Types: Cigarettes    Quit date: 2000    Years since quitting:  22.9   Smokeless tobacco: Never  Vaping Use   Vaping Use: Never used  Substance Use Topics   Alcohol use: No   Drug use: Never    Review of Systems  Constitutional: Positive for subjective fever/chills Eyes: No visual changes. ENT: No sore throat. Cardiovascular: Denies chest pain. Respiratory: Denies shortness of breath.  Positive for cough and cough with pink streaking. Gastrointestinal: No abdominal pain.  No nausea, no vomiting.  No diarrhea.  No constipation. Genitourinary: Negative for dysuria. Musculoskeletal: Negative for back pain. Positive for atraumatic neck pain. Skin: Negative for rash. Neurological: Negative for headaches, focal weakness or numbness.  ____________________________________________   PHYSICAL EXAM:  VITAL SIGNS: Vitals:   10/26/21 1530 10/26/21 1559  BP: 137/90 136/85  Pulse: 84 81  Resp: 20 18  Temp:    SpO2: 97% 99%     Constitutional: Alert and oriented. Well appearing and in no acute distress.  Morbidly obese.  Pleasantly conversational in full sentences. Eyes: Conjunctivae are normal. PERRL. EOMI. Head: Atraumatic. Nose: No congestion/rhinnorhea. Mouth/Throat: Mucous membranes are moist.  Oropharynx non-erythematous. Neck: No stridor. No cervical spine tenderness to palpation. Trach in place with clean surrounding 4 x 4 and collar.  No erythema, purulence or bleeding at insertion site.  No stridor.  Does have posterior paraspinal cervical tenderness to palpation and left-sided trapezius tenderness to palpation.  Palpable muscular cord is noted.  No overlying skin changes, signs of trauma Cardiovascular: Normal rate, regular rhythm. Grossly normal heart sounds.  Good peripheral circulation. Respiratory: Normal respiratory effort.  No retractions. Lungs CTAB. Gastrointestinal: Soft , nondistended, nontender to palpation. No CVA tenderness. Musculoskeletal: No lower extremity tenderness nor edema.  No joint effusions. No signs of acute  trauma. Trapezius tenderness and spasm as above. Neurologic:  Normal speech and language. No gross focal neurologic deficits are appreciated.  Cranial nerves II through XII intact 5/5 strength and sensation in all 4 extremities Skin:  Skin is warm, dry and intact. No rash noted. Psychiatric: Mood and affect are normal. Speech and behavior are normal.  ____________________________________________   LABS (all labs ordered are listed, but only abnormal results are displayed)  Labs Reviewed - No data to display ____________________________________________  12 Lead EKG   ____________________________________________  RADIOLOGY  ED MD interpretation:  CXR reviewed by me without evidence of acute cardiopulmonary pathology.  Official radiology report(s): DG Chest 2 View  Result Date: 10/26/2021 CLINICAL DATA:  Neck pain and hemoptysis. Tracheostomy patient. Diagnosed with COVID-19 4 days prior. EXAM: CHEST - 2 VIEW COMPARISON:  Radiographs 07/28/2021 and 09/09/2020.  CT 06/03/2021. FINDINGS: The heart size and mediastinal contours are stable with mild cardiomegaly. Tracheostomy appears unchanged in position. There is stable chronic lung disease with bilateral perihilar scarring. No superimposed airspace disease, pneumothorax or significant pleural effusion. The bones appear unchanged. IMPRESSION: Stable  appearance of the chest with cardiomegaly and chronic lung disease. No evidence of acute superimposed process. Electronically Signed   By: Richardean Sale M.D.   On: 10/26/2021 14:40    ____________________________________________   PROCEDURES and INTERVENTIONS  Procedure(s) performed (including Critical Care):  Procedures  Medications - No data to display  ____________________________________________   MDM / ED COURSE   Obese trach dependent patient presents to the ED with evidence of COVID-19 and amenable to outpatient management.  No distress, signs of CAP or trach pathology.   She appears to have muscular spasm to her left-sided paraspinal neck and trapezius musculature.  No evidence of shingles, trauma, neurologic or vascular deficits.  CXR without infiltrates, PTX or pulmonary congestion.  I see no barriers to outpatient management.  We will discharge with return precautions.     ____________________________________________   FINAL CLINICAL IMPRESSION(S) / ED DIAGNOSES  Final diagnoses:  Muscle spasms of neck  COVID-19  Acute cough     ED Discharge Orders          Ordered    lidocaine (LIDODERM) 5 %  Every 12 hours        10/26/21 1545             Jarquavious Fentress   Note:  This document was prepared using Systems analyst and may include unintentional dictation errors.    Vladimir Crofts, MD 10/26/21 [phone]

## 2021-10-27 ENCOUNTER — Encounter: Payer: Medicaid Other | Admitting: Obstetrics and Gynecology

## 2021-11-16 ENCOUNTER — Telehealth: Payer: Self-pay | Admitting: Pulmonary Disease

## 2021-11-16 NOTE — Telephone Encounter (Signed)
The CMN is in Dr. Domingo Dimes folder to be signed

## 2021-11-16 NOTE — Telephone Encounter (Signed)
Routing to [place].

## 2021-11-18 ENCOUNTER — Telehealth: Payer: Self-pay | Admitting: Pulmonary Disease

## 2021-11-18 NOTE — Telephone Encounter (Signed)
Instructions  You will need a referral to the heart doctor and also to the thyroid specialist.  I will send a note to the [facility] so they know what referrals need to be made.      Patient is aware to contact PCP regarding referral.  She voiced her understanding and had no further questions.  Nothing further needed at this time.

## 2021-12-22 ENCOUNTER — Ambulatory Visit: Payer: Medicaid Other | Admitting: Pulmonary Disease

## 2021-12-23 NOTE — Telephone Encounter (Signed)
Dr. Patsey Berthold signed a CMN on 11/18/2021 and it has been scanned into Epic

## 2022-01-25 ENCOUNTER — Ambulatory Visit: Payer: Medicaid Other | Admitting: Primary Care

## 2022-02-09 ENCOUNTER — Other Ambulatory Visit: Payer: Self-pay

## 2022-02-09 ENCOUNTER — Other Ambulatory Visit
Admission: RE | Admit: 2022-02-09 | Discharge: 2022-02-09 | Disposition: A | Discharge status: Home / Self Care | Payer: Medicaid Other | Source: Home / Self Care | Attending: Primary Care | Admitting: Primary Care

## 2022-02-09 ENCOUNTER — Ambulatory Visit
Admission: RE | Admit: 2022-02-09 | Discharge: 2022-02-09 | Disposition: A | Discharge status: Home / Self Care | Payer: Medicaid Other | Attending: Primary Care | Admitting: Primary Care

## 2022-02-09 ENCOUNTER — Ambulatory Visit
Admission: RE | Admit: 2022-02-09 | Discharge: 2022-02-09 | Disposition: A | Discharge status: Home / Self Care | Payer: Medicaid Other | Source: Ambulatory Visit | Attending: Primary Care | Admitting: Primary Care

## 2022-02-09 ENCOUNTER — Encounter: Payer: Self-pay | Admitting: Primary Care

## 2022-02-09 ENCOUNTER — Ambulatory Visit: Payer: Medicaid Other | Admitting: Primary Care

## 2022-02-09 VITALS — BP 120/80 | HR 95 | Temp 97.1°F | Ht 69.0 in | Wt 287.0 lb

## 2022-02-09 DIAGNOSIS — J45909 Unspecified asthma, uncomplicated: Secondary | ICD-10-CM

## 2022-02-09 DIAGNOSIS — E039 Hypothyroidism, unspecified: Secondary | ICD-10-CM

## 2022-02-09 LAB — TSH: TSH: 149 u[IU]/mL — ABNORMAL HIGH (ref 0.350–4.500)

## 2022-02-09 MED ORDER — PREDNISONE 10 MG PO TABS: ORAL_TABLET | ORAL | 0 refills | Status: DC

## 2022-02-09 MED ORDER — DOXYCYCLINE HYCLATE 100 MG PO TABS: 100.0000 mg | ORAL_TABLET | Freq: Two times a day (BID) | ORAL | 0 refills | Status: DC

## 2022-02-09 NOTE — Patient Instructions (Addendum)
Recommendations: ?Continue Budesonide nebulizer morning and evening  ?Resume albuterol nebulizer morning and evening  ?You do not need to use oxygen with exertion, only use at night  ?Take Synthroid daily BEFORE breakfast on an empty stomach  ?We will follow-up on endocrine referral (thyroid specialist) ?Keep apt with cardiology in April ? ?Orders: ?CXR today (ordered) ?Labs today (ordered) ? ?Rx: ?Doxycycline '100mg'$  twice daily x 7 days ?Prednisone taper as directed  ? ?Follow-up: ?2-3 months with Dr. Patsey Vega  ?

## 2022-02-09 NOTE — Progress Notes (Signed)
@Patient  ID: Kathy Vega, female    DOB: 1975-12-10, 46 y.o.   MRN: [id number]  Chief Complaint  Patient presents with   Follow-up    Referring provider: [facility]  HPI: 46 year old female, former smoker quit in 2000.  Past medical history significant for asthma, covid-19, tracheal stenosis.  Patient of Dr. Jayme Cloud.  Ordered for CT neck and chest to rule out obstruction for removal tracheostomy.  Maintained on albuterol nebulizer QID and budesonide nebulizer BID. Recommended follow-up 3 to 4 weeks.   Previous [facility] pulmonary encounter: 08/06/21 Kathy Vega is a 46 year old former smoker who follows here for the issue of shortness of breath.  This is a scheduled visit.  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  The patient underwent laryngoscopy and rigid bronchoscopy by Dr. Vergie Living at [facility] on 20 May.  She is to undergo further revision and potential dilation of her tracheal stenosis on 16 September.  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  Since then she has noted some increased shortness of breath and wheezing.  She continues to have poor tolerance of her Passy-Muir valve tolerating it only for about an hour before she gets short of breath.  She states that she is using her nebulizers and nocturnal oxygen.  She has been instructed to use albuterol 4 times a day and Pulmicort twice a day.  She uses this via nebulizer with trach collar.   She has not had any fevers, chills or sweats.  Cough is productive of white sputum which she freely expectorates through her trach.  She is phonating well with a Passy-Muir valve but as noted has limited time tolerating it.  She voices no other complaint today.  She continues follow-up with Dr. Vergie Living as noted above.  She had a TSH done recently is highly elevated.  She is on  thyroid supplementation.  This is likely impacting on her fatigue.  She has also had issues with anemia which are being worked up.  She also had an echocardiogram that showed EF of 45 to 50% with decrease LV function.     Despite her current issues Kathy Vega is actually doing fairly well, she had a very complicated COVID-19 course with significant respiratory failure that necessitated tracheostomy.  She has had significant subglottic stenosis that is now being handled by Dr. Vergie Living.  Continue nebulization treatments and nocturnal oxygen as above.   Her issues with dyspnea may be further aggravated by significant hypothyroidism and decreased LVEF noted on recent echocardiogram.  Recommend referral to endocrinology and cardiology.  These referrals will need to come from her primary care provider at the [facility] given her Medicaid status.   We will see her in follow-up in 3 months time she is to contact us prior to that time should any new difficulties arise.   02/09/2022 Patient presents today for overdue follow-up. During her last visit she was referred to cardiology and endocrinologist. Feels her breathing is some worse the last couple of weeks. No associated respiratory symptoms. She denies having a cough or nasal congstion. She can not tell if she is wheezing. She uses her budesonide nebulizer twice a day. She has not needed to use albuterol. She wears her 4L oxygen both during and at night. She is taking synthroid daily but on average forgets to take it maybe twice a week. She  never saw endocrine. She has an apt with cardiology on April 12th. Planning laryngoscopy with Dr. Vergie Living end of this month.    No Known Allergies   There is no immunization history on file for this patient.  Past Medical History:  Diagnosis Date   Anemia    Cervical cancer (HCC)    Thyroid disease     Tobacco History: Social History   Tobacco Use  Smoking Status Former   Packs/day:  0.50   Years: 5.00   Pack years: 2.50   Types: Cigarettes   Quit date: 2000   Years since quitting: 23.2  Smokeless Tobacco Never   Counseling given: Not Answered   Outpatient Medications Prior to Visit  Medication Sig Dispense Refill   albuterol (PROVENTIL) (2.5 MG/3ML) 0.083% nebulizer solution Take 3 mLs (2.5 mg total) by nebulization every 6 (six) hours as needed for wheezing or shortness of breath. 360 mL 11   ferrous sulfate 325 (65 FE) MG EC tablet Take 1 tablet (325 mg total) by mouth every other day. With breakfast     levothyroxine (SYNTHROID, LEVOTHROID) 125 MCG tablet Take 125 mcg by mouth daily before breakfast.     lidocaine (LIDODERM) 5 % Place 1 patch onto the skin every 12 (twelve) hours. Remove & Discard patch within 12 hours or as directed by MD 10 patch 0   medroxyPROGESTERone (PROVERA) 10 MG tablet Take 1 tablet (10 mg total) by mouth daily. Use for ten days 10 tablet 2   norethindrone (AYGESTIN) 5 MG tablet Take 1 tablet (5 mg total) by mouth 2 (two) times daily for 21 days. 42 tablet 0   norethindrone (MICRONOR) 0.35 MG tablet Take 1 tablet (0.35 mg total) by mouth daily. 28 tablet 0   Zinc 220 (50 Zn) MG CAPS Take 1 capsule by mouth daily. 90 capsule 0   budesonide (PULMICORT) 0.25 MG/2ML nebulizer solution Take 2 mLs (0.25 mg total) by nebulization 2 (two) times daily. 120 mL 11   No facility-administered medications prior to visit.      Review of Systems  Review of Systems  HENT: Negative.    Respiratory:  Positive for cough, shortness of breath and wheezing.     Physical Exam  BP 120/80 (BP Location: Left Arm, Patient Position: Sitting, Cuff Size: Normal)   Pulse 95   Temp (!) 97.1 F (36.2 C) (Oral)   Ht 5\' 9"  (1.753 m)   Wt 287 lb (130.2 kg)   SpO2 98%   BMI 42.38 kg/m  Physical Exam Constitutional:      General: She is not in acute distress.    Appearance: Normal appearance. She is obese.  Pulmonary:     Effort: Pulmonary effort is  normal. No respiratory distress.     Breath sounds: Wheezing and rhonchi present. No rales.  Musculoskeletal:        General: Normal range of motion.  Skin:    General: Skin is warm.  Neurological:     Mental Status: She is alert.     Lab Results:  CBC    Component Value Date/Time   WBC 5.9 07/29/2021 0154   RBC 2.70 (L) 07/29/2021 0154   HGB 8.6 (L) 07/29/2021 1443   HCT 27.2 (L) 07/29/2021 1443   HCT 22.0 (L) 03/15/2018 1622   PLT 242 07/29/2021 0154   MCV 77.8 (L) 07/29/2021 0154   MCH 24.4 (L) 07/29/2021 0154   MCHC 31.4 07/29/2021 0154   RDW 22.3 (H) 07/29/2021 0154  LYMPHSABS 1.2 09/16/2020 0627   MONOABS 0.5 09/16/2020 0627   EOSABS 0.1 09/16/2020 0627   BASOSABS 0.1 09/16/2020 0627    BMET    Component Value Date/Time   NA 137 07/29/2021 0154   NA 140 03/03/2014 1932   K 3.7 07/29/2021 0154   K 3.9 03/03/2014 1932   CL 103 07/29/2021 0154   CL 103 03/03/2014 1932   CO2 26 07/29/2021 0154   CO2 32 03/03/2014 1932   GLUCOSE 103 (H) 07/29/2021 0154   GLUCOSE 91 03/03/2014 1932   BUN 14 07/29/2021 0154   BUN 11 03/03/2014 1932   CREATININE 1.01 (H) 07/29/2021 0154   CREATININE 0.96 03/03/2014 1932   CALCIUM 9.3 07/29/2021 0154   CALCIUM 9.3 03/03/2014 1932   GFRNONAA >60 07/29/2021 0154   GFRNONAA >60 03/03/2014 1932   GFRAA >60 09/01/2020 0448   GFRAA >60 03/03/2014 1932    BNP    Component Value Date/Time   BNP 11.0 07/29/2021 0154    ProBNP No results found for: PROBNP  Imaging: No results found.   Assessment & Plan:   No problem-specific Assessment & Plan notes found for this encounter.  1. Persistent asthma without complication, unspecified asthma severity - DG Chest 2 View; Future  2. Hypothyroidism, unspecified type - TSH; Future - Ambulatory referral to Endocrinology  Asthma Continue Budesonide nebulizer morning and evening  Resume albuterol nebulizer morning and evening   Nocturnal hypoxia  No daytime oxygen  requirements, continue nocturnal oxygen   Hypothyroidism Advised patient take Synthroid daily BEFORE breakfast on an empty stomach  We will follow-up on endocrine referral (thyroid specialist)   Orders: CXR today (ordered) Labs today (ordered)  Rx: Doxycycline 100mg  twice daily x 7 days Prednisone taper as directed   Follow-up: 2-3 months with Dr. Cherlyn Labella, NP 02/09/2022

## 2022-02-11 NOTE — Progress Notes (Signed)
Please let patient know CXR showed chronic scarring of her lungs which is unchanged. No obvious infiltrates/pneumonia. Stable tracheostomy tube.  ? ?Also TSH remains significantly elevated, I referred her urgently to endocrine. Needs visit with them in the next 1-2 weeks. If not able to get apt she should reach out to PCP to have them adjust her synthroid. If she does not have PCP let me know and I can adjust

## 2022-02-25 HISTORY — PX: LARYNGOSCOPY: SHX5203

## 2022-02-26 ENCOUNTER — Other Ambulatory Visit: Payer: Self-pay

## 2022-02-26 ENCOUNTER — Encounter: Payer: Self-pay | Admitting: Emergency Medicine

## 2022-02-26 ENCOUNTER — Emergency Department
Admission: EM | Admit: 2022-02-26 | Discharge: 2022-02-26 | Disposition: A | Discharge status: Home / Self Care | Payer: Medicaid Other | Attending: Emergency Medicine | Admitting: Emergency Medicine

## 2022-02-26 DIAGNOSIS — Z430 Encounter for attention to tracheostomy: Secondary | ICD-10-CM | POA: Diagnosis present

## 2022-02-26 NOTE — ED Provider Notes (Signed)
? ?  [facility] ?Provider Note ? ? ? Event Date/Time  ? First MD Initiated Contact with Patient 02/26/22 2151   ?  (approximate) ? ? ?History  ? ?Post-op Problem ? ? ?HPI ? ?Kathy Vega is a 46 y.o. female  who, per outside hospital note dated 02/09/2022 has history of tracheal stenosis and tracheostomy, who presents to the emergency department today because of concerns for increased secretions and need for suctioning of her trach.  Apparently the type of tracheostomy the patient has was recently switched and they do not have the suction equipment to suction the new type.  The patient denies any fevers.  Does still have some discomfort around her throat where they did the recent procedure. ? ?  ? ? ?Physical Exam  ? ?Triage Vital Signs: ?ED Triage Vitals  ?Enc Vitals Group  ?   BP 02/26/22 2112 127/90  ?   Pulse Rate 02/26/22 2112 94  ?   Resp 02/26/22 2112 (!) 22  ?   Temp 02/26/22 2112 98 ?F (36.7 ?C)  ?   Temp Source 02/26/22 2112 Oral  ?   SpO2 02/26/22 2112 96 %  ?   Weight 02/26/22 2108 287 lb (130.2 kg)  ?   Height 02/26/22 2108 '5\' 9"'$  (1.753 m)  ?   Head Circumference --   ?   Peak Flow --   ?   Pain Score 02/26/22 2107 10  ?   Pain Loc --   ?   Pain Edu? --   ?   Excl. in [place]? --   ? ? ?Most recent vital signs: ?Vitals:  ? 02/26/22 2112 02/26/22 2115  ?BP: 127/90 127/90  ?Pulse: 94 91  ?Resp: (!) 22   ?Temp: 98 ?F (36.7 ?C)   ?SpO2: 96% 94%  ? ? ?General: Awake, no distress.  ?CV:  Good peripheral perfusion.  ?Resp:  Normal effort. Lungs clear to auscultation ?Abd:  No distention.  ? ?ED Results / Procedures / Treatments  ? ?Labs ?(all labs ordered are listed, but only abnormal results are displayed) ?Labs Reviewed - No data to display ? ? ?EKG ? ?None ? ? ?RADIOLOGY ?None ? ? ?PROCEDURES: ? ?Critical Care performed: No ? ?Procedures ? ? ?MEDICATIONS ORDERED IN ED: ?Medications - No data to display ? ? ?IMPRESSION / MDM / ASSESSMENT AND PLAN / ED COURSE  ?I reviewed the triage vital signs  and the nursing notes. ?             ?               ? ?Differential diagnosis includes, but is not limited to, increase secretions in trachea likely secondary to procedure. ? ?Patient presented to the emergency department today because of concerns for requiring suctionin of her tracheostomy.  Respiratory therapy did come and suction her tracheostomy.  She stated that she felt better afterwards.  No respiratory distress.  Will plan on discharging to follow-up with her physicians. ? ? ? ?FINAL CLINICAL IMPRESSION(S) / ED DIAGNOSES  ? ?Final diagnoses:  ?Tracheostomy care [facility])  ? ? ? ?Note:  This document was prepared using Dragon voice recognition software and may include unintentional dictation errors. ? ?  ?Nance Pear, MD ?02/26/22 2208 ? ?

## 2022-02-26 NOTE — Progress Notes (Signed)
Patient was seen by RT for suctioning of airway. Husband states patient had trach changed Friday at [facility] and has no way of suctioning secretions from trach. Patient was Covid patient during the pandemic and was discharged home with a trach. He states she does have a trach collar for use but never received portable suction unit.  She had a trach that allowed her to change her inner cannula when needed but what she received from [facility] does not permit her to change anything. I was able to suction trach for moderated amount of thick white secretions w/o difficulty. BBS clear post sxn. Room air saturation noted at 95. Patient tolerated interventions well. Advised patient and spouse to utilize trach collar humidity setup until they are able to speak with home health company about portable suction.  ?

## 2022-02-26 NOTE — Discharge Instructions (Signed)
Please seek medical attention for any high fevers, chest pain, shortness of breath, change in behavior, persistent vomiting, bloody stool or any other new or concerning symptoms.  

## 2022-02-26 NOTE — ED Notes (Signed)
This RN called RT per Dr. Archie Balboa to come suction pt. ?

## 2022-02-26 NOTE — ED Triage Notes (Addendum)
Pt to ED via POV, pt's family member reports had trach placed on Friday, reports that patient was discharged today and is unable to get up the phlegm in her throat. ? ?Pt's family member reports pt has a new trach and was sent home without suctioning supplies or machine to be able to suction. Pt appears in NAD on arrival to room, intermittent cough noted at this time.  ?

## 2022-03-08 NOTE — Progress Notes (Signed)
Cardiology Office Note ? ?Date:  03/09/2022  ? ?ID:  Kathy Vega, DOB 1976/10/17, MRN [id number] ? ?PCP:  [facility] [place]  ? ?Chief Complaint  ?Patient presents with  ? New Patient (Initial Visit)  ?  Patient c/o shortness of breath, LE edema, chest pain and irregular heartbeats. Medications reviewed by the patient verbally.   ? ? ?HPI:  ?Kathy Vega is a 46 year old woman with past medical history of ?Admitted to the hospital 08/13/20 with COVID-19 pneumonia & ARDS requiring intubation on 9/17 and mechanical ventilation.   ? Proning protocols were followed while in ICU.   required maximal ventilation support with sedation and remained critically ill.  She was weaned to FiO2 60% on 10/1.  Tracheostomy was placed on 09/04/20.  Patient began tolerating trach collar O2 in short durations 10/12-13 and began OOB to chair.   Started working with PT on 10/15.  Treated with 7 days Cefepime due to tracheal aspirate culture growing Pseudomonas. ?Sepsis resolved before discharge. ?Also with history of morbid obesity, ?Followed by pulmonary, ENT ?Who presents by referral from [facility] for consultation of her shortness of breath, mildly decreased ejection fraction September 2022 in the setting of anemia, heavy menses ? ?Who presents for new patient evaluation of shortness of breath, leg edema, chest pain, irregular beats ? ?Echocardiogram done [facility] August 2022, normal ejection fraction, moderate LVH ? ?Oxygen machine at night' ? ?Problems with anemia, hemoglobin down in the 6 range requiring transfusion during September 2022 ?No check of blood count since that time ? ?She denies significant lower extremity edema ?She has had increased secretions, waiting for suction machine that has not come in the mail ? ?EKG personally reviewed by myself on todays visit ?Normal sinus rhythm rate 77 bpm no significant ST-T wave changes ? ?Echo 9/22 in the [facility] system ? 2. Left ventricular ejection fraction,  by estimation, is 45 to 50%. The  ?left ventricle has mildly decreased function. The left ventricle  ?demonstrates global hypokinesis. The left ventricular internal cavity size  ?was mildly dilated. There is moderate  ?left ventricular hypertrophy. Left ventricular diastolic parameters are  ?indeterminate.  ? 3. Right ventricular systolic function is normal. The right ventricular  ?size is normal. There is normal pulmonary artery systolic pressure. The  ?estimated right ventricular systolic pressure is 22.0 mmHg.  ? 4. The mitral valve is normal in structure. Mild mitral valve  ?regurgitation. No evidence of mitral stenosis.  ? ? ? ?PMH:   has a past medical history of Anemia, Cervical cancer ([place]), and Thyroid disease. ? ?PSH:    ?Past Surgical History:  ?Procedure Laterality Date  ? CARDIAC CATHETERIZATION    ? CERVICAL BIOPSY  W/ LOOP ELECTRODE EXCISION    ? CESAREAN SECTION    ? CHOLECYSTECTOMY    ? LEG SURGERY    ? TRACHEOSTOMY TUBE PLACEMENT N/A 09/04/2020  ? Procedure: TRACHEOSTOMY;  Surgeon: Beverly Gust, MD;  Location: [facility] ORS;  Service: ENT;  Laterality: N/A;  ? ? ?Current Outpatient Medications  ?Medication Sig Dispense Refill  ? albuterol (PROVENTIL) (2.5 MG/3ML) 0.083% nebulizer solution Take 3 mLs (2.5 mg total) by nebulization every 6 (six) hours as needed for wheezing or shortness of breath. 360 mL 11  ? ferrous sulfate 325 (65 FE) MG EC tablet Take 1 tablet (325 mg total) by mouth every other day. With breakfast    ? levothyroxine (SYNTHROID, LEVOTHROID) 125 MCG tablet Take 125 mcg by mouth daily before breakfast.    ?  doxycycline (VIBRA-TABS) 100 MG tablet Take 1 tablet (100 mg total) by mouth 2 (two) times daily. (Patient not taking: Reported on 03/09/2022) 14 tablet 0  ? lidocaine (LIDODERM) 5 % Place 1 patch onto the skin every 12 (twelve) hours. Remove & Discard patch within 12 hours or as directed by MD (Patient not taking: Reported on 03/09/2022) 10 patch 0  ? medroxyPROGESTERone (PROVERA)  10 MG tablet Take 1 tablet (10 mg total) by mouth daily. Use for ten days (Patient not taking: Reported on 03/09/2022) 10 tablet 2  ? norethindrone (AYGESTIN) 5 MG tablet Take 1 tablet (5 mg total) by mouth 2 (two) times daily for 21 days. (Patient not taking: Reported on 03/09/2022) 42 tablet 0  ? norethindrone (MICRONOR) 0.35 MG tablet Take 1 tablet (0.35 mg total) by mouth daily. (Patient not taking: Reported on 03/09/2022) 28 tablet 0  ? predniSONE (DELTASONE) 10 MG tablet 4 tabs for 2 days, then 3 tabs for 2 days, 2 tabs for 2 days, then 1 tab for 2 days, then stop (Patient not taking: Reported on 03/09/2022) 20 tablet 0  ? Zinc 220 (50 Zn) MG CAPS Take 1 capsule by mouth daily. (Patient not taking: Reported on 03/09/2022) 90 capsule 0  ? ?No current facility-administered medications for this visit.  ? ? ? ?Allergies:   Patient has no known allergies.  ? ?Social History:  The patient  reports that she quit smoking about 23 years ago. Her smoking use included cigarettes. She has a 2.50 pack-year smoking history. She has never used smokeless tobacco. She reports that she does not drink alcohol and does not use drugs.  ? ?Family History:   family history includes Heart disease in her father; Hyperlipidemia in her mother; Hypertension in her mother; Liver cancer in her father.  ? ? ?Review of Systems: ?Review of Systems  ?Constitutional: Negative.   ?HENT: Negative.    ?Respiratory: Negative.    ?Cardiovascular: Negative.   ?Gastrointestinal: Negative.   ?Musculoskeletal: Negative.   ?Neurological: Negative.   ?Psychiatric/Behavioral: Negative.    ?All other systems reviewed and are negative. ? ? ?PHYSICAL EXAM: ?VS:  BP 110/82 (BP Location: Right Arm, Patient Position: Sitting, Cuff Size: Normal)   Pulse 77   Ht '5\' 9"'$  (1.753 m)   Wt 129 kg   SpO2 98%   BMI 41.99 kg/m?  , BMI Body mass index is 41.99 kg/m?. ?GEN: Well nourished, well developed, in no acute distress ?HEENT: normal ?Neck: no JVD, carotid bruits, or  masses ?Cardiac: RRR; no murmurs, rubs, or gallops,no edema  ?Respiratory:  clear to auscultation bilaterally, normal work of breathing ?GI: soft, nontender, nondistended, + BS ?MS: no deformity or atrophy ?Skin: warm and dry, no rash ?Neuro:  Strength and sensation are intact ?Psych: euthymic mood, full affect ? ? ?Recent Labs: ?07/29/2021: ALT 11; B Natriuretic Peptide 11.0; BUN 14; Creatinine, Ser 1.01; Hemoglobin 8.6; Platelets 242; Potassium 3.7; Sodium 137 ?02/09/2022: TSH 149.000  ? ? ?Lipid Panel ?Lab Results  ?Component Value Date  ? TRIG 1,906 (H) 08/17/2020  ? ?  ? ?Wt Readings from Last 3 Encounters:  ?03/09/22 129 kg  ?02/26/22 130.2 kg  ?02/09/22 130.2 kg  ?  ? ? ?ASSESSMENT AND PLAN: ? ?Problem List Items Addressed This Visit   ? ? Pneumonia due to COVID-19 virus - Primary  ? Relevant Orders  ? EKG 12-Lead  ? ?Other Visit Diagnoses   ? ? Leg edema      ? Relevant Orders  ?  EKG 12-Lead  ? ?  ? ?Shortness of breath ?Ejection fraction mildly depressed in the setting of profound anemia September 2022 ?Repeat CBC  ordered, also repeat echocardiogram ?Grossly appears euvolemic ?Reports increased secretions, has not received her suction machine from pulmonary ?Low risk for underlying ischemia ?Does not appear to have CHF symptoms at this time, not on a diuretic ? ?Pneumonia, history of COVID, still with trach ?Long discussion concerning events from 2021 ?Complicated course in the ICU required intubation, trach aggressive therapy ?Reports symptoms have stabilized ? ?Anemia ?Repeat CBC today, none in the past 6 months stable. ?Was hospitalized September 2022 for anemia required transfusion ?Previously reported heavy menses ? ?Cardiomyopathy ?Mildly depressed ejection fraction 45% up to 50% on echocardiogram September 2022 ?Repeat echo ordered ?No medication changes at this time ? ?Irregular heartbeat ?Etiology unclear, ZIO monitor ordered ?Normal EKG on today's visit ? ? Total encounter time more than 60  minutes ? Greater than 50% was spent in counseling and coordination of care with the patient ? ? ? ?Signed, ?Esmond Plants, M.D., Ph.D. ?[facility] [place], [place] ?[phone] ?

## 2022-03-09 ENCOUNTER — Ambulatory Visit (INDEPENDENT_AMBULATORY_CARE_PROVIDER_SITE_OTHER): Payer: Medicaid Other | Admitting: Cardiovascular Disease

## 2022-03-09 ENCOUNTER — Ambulatory Visit (INDEPENDENT_AMBULATORY_CARE_PROVIDER_SITE_OTHER): Payer: Medicaid Other

## 2022-03-09 ENCOUNTER — Encounter: Payer: Self-pay | Admitting: Cardiovascular Disease

## 2022-03-09 VITALS — BP 110/82 | HR 77 | Ht 69.0 in | Wt 284.4 lb

## 2022-03-09 DIAGNOSIS — I499 Cardiac arrhythmia, unspecified: Secondary | ICD-10-CM | POA: Diagnosis not present

## 2022-03-09 DIAGNOSIS — J1282 Pneumonia due to coronavirus disease 2019: Secondary | ICD-10-CM

## 2022-03-09 DIAGNOSIS — U071 COVID-19: Secondary | ICD-10-CM | POA: Diagnosis not present

## 2022-03-09 DIAGNOSIS — R600 Localized edema: Secondary | ICD-10-CM | POA: Diagnosis not present

## 2022-03-09 DIAGNOSIS — R0602 Shortness of breath: Secondary | ICD-10-CM | POA: Diagnosis not present

## 2022-03-09 NOTE — Patient Instructions (Addendum)
Medication Instructions:  ?No changes ? ?If you need a refill on your cardiac medications before your next appointment, please call your pharmacy.  ? ?Lab work: ?Today: CBC, BMP ? ?Testing/Procedures: ? ?Your physician has requested that you have an echocardiogram. Echocardiography is a painless test that uses sound waves to create images of your heart. It provides your doctor with information about the size and shape of your heart and how well your heart?s chambers and valves are working. This procedure takes approximately one hour. There are no restrictions for this procedure.  ? ?Your provider has ordered a heart monitor to wear for 14 days. This will be mailed to your home with instructions on placement. Once you have finished the time frame requested, you will return monitor in box provided. ? ?  ? ?Follow-Up: ?At [facility], you and your health needs are our priority.  As part of our continuing mission to provide you with exceptional heart care, we have created designated Provider Care Teams.  These Care Teams include your primary Cardiologist (physician) and Advanced Practice Providers (APPs -  Physician Assistants and Nurse Practitioners) who all work together to provide you with the care you need, when you need it. ? ?You will need a follow up appointment as needed ? ?Providers on your designated Care Team:   ?Murray Hodgkins, NP ?Christell Faith, PA-C ?Cadence Kathlen Mody, PA-C ? ?COVID-19 Vaccine Information can be found at: [email or web address] For questions related to vaccine distribution or appointments, please email [email or web address] or call [phone].  ? ?

## 2022-03-10 ENCOUNTER — Telehealth: Payer: Self-pay | Admitting: Primary Care

## 2022-03-10 LAB — BASIC METABOLIC PANEL
BUN/Creatinine Ratio: 11 (ref 9–23)
BUN: 11 mg/dL (ref 6–24)
CO2: 25 mmol/L (ref 20–29)
Calcium: 9.8 mg/dL (ref 8.7–10.2)
Chloride: 103 mmol/L (ref 96–106)
Creatinine, Ser: 0.97 mg/dL (ref 0.57–1.00)
Glucose: 114 mg/dL — ABNORMAL HIGH (ref 70–99)
Potassium: 4.2 mmol/L (ref 3.5–5.2)
Sodium: 142 mmol/L (ref 134–144)
eGFR: 73 mL/min/{1.73_m2} (ref >59)

## 2022-03-10 LAB — CBC
Hematocrit: 30.8 % — ABNORMAL LOW (ref 34.0–46.6)
Hemoglobin: 9.8 g/dL — ABNORMAL LOW (ref 11.1–15.9)
MCH: 25.1 pg — ABNORMAL LOW (ref 26.6–33.0)
MCHC: 31.8 g/dL (ref 31.5–35.7)
MCV: 79 fL (ref 79–97)
Platelets: 350 10*3/uL (ref 150–450)
RBC: 3.91 x10E6/uL (ref 3.77–5.28)
RDW: 17.2 % — ABNORMAL HIGH (ref 11.7–15.4)
WBC: 5.9 10*3/uL (ref 3.4–10.8)

## 2022-03-14 ENCOUNTER — Encounter: Payer: Self-pay | Admitting: Emergency Medicine

## 2022-03-14 ENCOUNTER — Emergency Department
Admission: EM | Admit: 2022-03-14 | Discharge: 2022-03-14 | Disposition: A | Discharge status: Home / Self Care | Payer: Medicaid Other | Attending: Emergency Medicine | Admitting: Emergency Medicine

## 2022-03-14 ENCOUNTER — Other Ambulatory Visit: Payer: Self-pay

## 2022-03-14 DIAGNOSIS — Z930 Tracheostomy status: Secondary | ICD-10-CM | POA: Diagnosis present

## 2022-03-14 DIAGNOSIS — Z430 Encounter for attention to tracheostomy: Secondary | ICD-10-CM

## 2022-03-14 NOTE — ED Provider Notes (Signed)
? ?  [facility] ?Provider Note ? ? ? Event Date/Time  ? First MD Initiated Contact with Patient 03/14/22 1331   ?  (approximate) ? ? ?History  ? ?Tracheostomy Tube Change ? ? ?HPI ? ?Kathy Vega is a 46 y.o. female with a history of CAP, acute respiratory failure, tracheal stenosis with tracheostomy who presented to the emergency department today for tracheostomy care ?  ? ? ?Physical Exam  ? ?Triage Vital Signs: ?ED Triage Vitals  ?Enc Vitals Group  ?   BP 03/14/22 1324 (!) 140/99  ?   Pulse Rate 03/14/22 1324 89  ?   Resp 03/14/22 1324 18  ?   Temp 03/14/22 1324 98.3 ?F (36.8 ?C)  ?   Temp Source 03/14/22 1324 Oral  ?   SpO2 03/14/22 1324 97 %  ?   Weight 03/14/22 1322 129 kg (284 lb 6.3 oz)  ?   Height 03/14/22 1322 1.753 m ('5\' 9"'$ )  ?   Head Circumference --   ?   Peak Flow --   ?   Pain Score 03/14/22 1321 0  ?   Pain Loc --   ?   Pain Edu? --   ?   Excl. in [place]? --   ? ? ?Most recent vital signs: ?Vitals:  ? 03/14/22 1324  ?BP: (!) 140/99  ?Pulse: 89  ?Resp: 18  ?Temp: 98.3 ?F (36.8 ?C)  ?SpO2: 97%  ? ? ? ?General: Awake, no distress.  ?CV:  Good peripheral perfusion.  ?Resp:  Normal effort.  ?Abd:  No distention.  ?Other:  Tracheostomy in place, no significant discharge, patient breathing comfortably ? ? ?ED Results / Procedures / Treatments  ? ?Labs ?(all labs ordered are listed, but only abnormal results are displayed) ?Labs Reviewed - No data to display ? ? ?EKG ? ? ? ? ?RADIOLOGY ? ? ? ? ?PROCEDURES: ? ?Critical Care performed:  ? ?Procedures ? ? ?MEDICATIONS ORDERED IN ED: ?Medications - No data to display ? ? ?IMPRESSION / MDM / ASSESSMENT AND PLAN / ED COURSE  ?I reviewed the triage vital signs and the nursing notes. ? ? ?Respiratory therapist came by and suctioned trach.  Patient reports they have not yet received home care supplies ? ?Patient does not require any further assistance at this time ? ? ? ? ?  ? ? ?FINAL CLINICAL IMPRESSION(S) / ED DIAGNOSES  ? ?Final diagnoses:   ?Tracheostomy care [facility])  ? ? ? ?Rx / DC Orders  ? ?ED Discharge Orders   ? ? None  ? ?  ? ? ? ?Note:  This document was prepared using Dragon voice recognition software and may include unintentional dictation errors. ?  ?Lavonia Drafts, MD ?03/14/22 1600 ? ?

## 2022-03-14 NOTE — Telephone Encounter (Signed)
Last OV note faxed to Dr. De Hollingshead office per request. ?Nothing further needed.  ? ?

## 2022-03-14 NOTE — ED Triage Notes (Signed)
Pt here for trach care. Pt has not received her home supplies yet. ?

## 2022-03-14 NOTE — ED Notes (Signed)
See triage note  presents with family to have trach suctioned  family states her machine broke  waiting on new machine ?

## 2022-03-16 DIAGNOSIS — I499 Cardiac arrhythmia, unspecified: Secondary | ICD-10-CM

## 2022-03-30 ENCOUNTER — Ambulatory Visit (INDEPENDENT_AMBULATORY_CARE_PROVIDER_SITE_OTHER): Payer: Medicaid Other

## 2022-03-30 DIAGNOSIS — R0602 Shortness of breath: Secondary | ICD-10-CM | POA: Diagnosis not present

## 2022-03-30 LAB — ECHOCARDIOGRAM COMPLETE
AR max vel: 2.83 cm2
AV Area VTI: 2.72 cm2
AV Area mean vel: 2.65 cm2
AV Mean grad: 4 mmHg
AV Peak grad: 6.5 mmHg
Ao pk vel: 1.27 m/s
S' Lateral: 4 cm
Single Plane A4C EF: 50.8 %

## 2022-03-31 ENCOUNTER — Encounter: Payer: Self-pay | Admitting: Cardiovascular Disease

## 2022-04-01 ENCOUNTER — Emergency Department
Admission: EM | Admit: 2022-04-01 | Discharge: 2022-04-01 | Disposition: A | Discharge status: Home / Self Care | Payer: Medicaid Other | Attending: Student in an Organized Health Care Education/Training Program | Admitting: Student in an Organized Health Care Education/Training Program

## 2022-04-01 ENCOUNTER — Other Ambulatory Visit: Payer: Self-pay

## 2022-04-01 ENCOUNTER — Emergency Department: Payer: Medicaid Other

## 2022-04-01 ENCOUNTER — Encounter: Payer: Self-pay | Admitting: Intensive Care

## 2022-04-01 DIAGNOSIS — R002 Palpitations: Secondary | ICD-10-CM | POA: Insufficient documentation

## 2022-04-01 DIAGNOSIS — J45909 Unspecified asthma, uncomplicated: Secondary | ICD-10-CM | POA: Insufficient documentation

## 2022-04-01 LAB — CBC
HCT: 32.8 % — ABNORMAL LOW (ref 36.0–46.0)
Hemoglobin: 10 g/dL — ABNORMAL LOW (ref 12.0–15.0)
MCH: 24.4 pg — ABNORMAL LOW (ref 26.0–34.0)
MCHC: 30.5 g/dL (ref 30.0–36.0)
MCV: 80.2 fL (ref 80.0–100.0)
Platelets: 308 10*3/uL (ref 150–400)
RBC: 4.09 MIL/uL (ref 3.87–5.11)
RDW: 17.4 % — ABNORMAL HIGH (ref 11.5–15.5)
WBC: 6.3 10*3/uL (ref 4.0–10.5)
nRBC: 0 % (ref 0.0–0.2)

## 2022-04-01 LAB — COMPREHENSIVE METABOLIC PANEL
ALT: 15 U/L (ref 0–44)
AST: 23 U/L (ref 15–41)
Albumin: 4.2 g/dL (ref 3.5–5.0)
Alkaline Phosphatase: 69 U/L (ref 38–126)
Anion gap: 6 (ref 5–15)
BUN: 10 mg/dL (ref 6–20)
CO2: 29 mmol/L (ref 22–32)
Calcium: 9.5 mg/dL (ref 8.9–10.3)
Chloride: 104 mmol/L (ref 98–111)
Creatinine, Ser: 0.79 mg/dL (ref 0.44–1.00)
GFR, Estimated: >60 mL/min (ref >60)
Glucose, Bld: 102 mg/dL — ABNORMAL HIGH (ref 70–99)
Potassium: 3.6 mmol/L (ref 3.5–5.1)
Sodium: 139 mmol/L (ref 135–145)
Total Bilirubin: 0.7 mg/dL (ref 0.3–1.2)
Total Protein: 7.5 g/dL (ref 6.5–8.1)

## 2022-04-01 LAB — D-DIMER, QUANTITATIVE: D-Dimer, Quant: 0.27 ug/mL-FEU (ref 0.00–0.50)

## 2022-04-01 LAB — TROPONIN I (HIGH SENSITIVITY): Troponin I (High Sensitivity): 3 ng/L (ref <18)

## 2022-04-01 MED ORDER — PROPRANOLOL HCL 10 MG PO TABS: 10.0000 mg | ORAL_TABLET | Freq: Two times a day (BID) | ORAL | 1 refills | Status: DC | PRN

## 2022-04-01 MED ORDER — PROPRANOLOL HCL 20 MG PO TABS
10.0000 mg | ORAL_TABLET | Freq: Every day | ORAL | Status: DC
  Administered 2022-04-01: 10 mg via ORAL
  Filled 2022-04-01: qty 1

## 2022-04-01 NOTE — ED Notes (Signed)
Pt presents to ED with c/o of palpitations that initially started Wednesday, pt states minimal SOB, pt states no pain or palpations yesterday but states today she "felt like she was missing beats." Pt states this woke her up today at 0300. Pt does have a trach present due to COVID a few years ago. NAD noted. Pt is A&Ox4.  ? ? ? ? ?

## 2022-04-01 NOTE — ED Notes (Signed)
D/C and new RX and cardiology f/up discussed with pt, pt verbalized understanding. NAD noted. Pt ambulatory on D/C. S/O with pt.  ?

## 2022-04-01 NOTE — ED Notes (Signed)
X RAY at bedside 

## 2022-04-01 NOTE — ED Triage Notes (Signed)
Patient c/o heart palpitations and chest pain that started 2 days ago.  ?

## 2022-04-01 NOTE — ED Notes (Signed)
Lab at bedside

## 2022-04-01 NOTE — ED Provider Notes (Signed)
? ?[facility] ?Provider Note ? ? ? Event Date/Time  ? First MD Initiated Contact with Patient 04/01/22 [phone]   ?  (approximate) ? ? ?History  ? ?Palpitations ? ? ?HPI ? ?Kathy Vega is a 46 y.o. female   extensive and complex past medical history including acute renal failure trach in place asthma symptomatic anemia presented to ER for evaluation of palpitations over the past 24 hours.  States that she is feeling palpitations and like her heart is skipping beats constantly.  She feels like they are happening right now.  She denies any significant pain no significant dyspnea no nausea or vomiting.  No pain with deep inspiration. ? ?  ? ? ?Physical Exam  ? ?Triage Vital Signs: ?ED Triage Vitals  ?Enc Vitals Group  ?   BP 04/01/22 0943 (!) 143/110  ?   Pulse Rate 04/01/22 0943 96  ?   Resp 04/01/22 0943 18  ?   Temp 04/01/22 0943 98.3 ?F (36.8 ?C)  ?   Temp Source 04/01/22 0943 Oral  ?   SpO2 04/01/22 0943 99 %  ?   Weight 04/01/22 0945 280 lb (127 kg)  ?   Height 04/01/22 0945 '5\' 9"'$  (1.753 m)  ?   Head Circumference --   ?   Peak Flow --   ?   Pain Score 04/01/22 0945 7  ?   Pain Loc --   ?   Pain Edu? --   ?   Excl. in [place]? --   ? ? ?Most recent vital signs: ?Vitals:  ? 04/01/22 1200 04/01/22 1237  ?BP: (!) 140/92 (!) 128/96  ?Pulse: 85 93  ?Resp: 18   ?Temp:    ?SpO2: 95%   ? ? ? ?Constitutional: Alert  ?Eyes: Conjunctivae are normal.  ?Head: Atraumatic. ?Nose: No congestion/rhinnorhea. ?Mouth/Throat: Mucous membranes are moist.   ?Neck: Painless ROM.  ?Cardiovascular:   Good peripheral circulation.  Normal rate and rhythm ?Respiratory: Normal respiratory effort.  No retractions. No wheezing or crackles ?Gastrointestinal: Soft and nontender.  ?Musculoskeletal:  no deformity ?Neurologic:  MAE spontaneously. No gross focal neurologic deficits are appreciated.  ?Skin:  Skin is warm, dry and intact. No rash noted. ?Psychiatric: Mood and affect are normal. Speech and behavior are normal. ? ? ? ?ED  Results / Procedures / Treatments  ? ?Labs ?(all labs ordered are listed, but only abnormal results are displayed) ?Labs Reviewed  ?CBC - Abnormal; Notable for the following components:  ?    Result Value  ? Hemoglobin 10.0 (*)   ? HCT 32.8 (*)   ? MCH 24.4 (*)   ? RDW 17.4 (*)   ? All other components within normal limits  ?COMPREHENSIVE METABOLIC PANEL - Abnormal; Notable for the following components:  ? Glucose, Bld 102 (*)   ? All other components within normal limits  ?D-DIMER, QUANTITATIVE  ?TROPONIN I (HIGH SENSITIVITY)  ?TROPONIN I (HIGH SENSITIVITY)  ? ? ? ?EKG ? ?ED ECG REPORT ?I, Merlyn Lot, the attending physician, personally viewed and interpreted this ECG. ? ? Date: 04/01/2022 ? EKG Time: 9:39 ? Rate: 95 ? Rhythm: sinus ? Axis: normal ? Intervals:normal ? ST&T Change: no stemi, nonspecific st ? ? ? ?RADIOLOGY ?Please see ED Course for my review and interpretation. ? ?I personally reviewed all radiographic images ordered to evaluate for the above acute complaints and reviewed radiology reports and findings.  These findings were personally discussed with the patient.  Please see medical  record for radiology report. ? ? ? ?PROCEDURES: ? ?Critical Care performed:  ? ?Procedures ? ? ?MEDICATIONS ORDERED IN ED: ?Medications  ?propranolol (INDERAL) tablet 10 mg (10 mg Oral Given 04/01/22 1237)  ? ? ? ?IMPRESSION / MDM / ASSESSMENT AND PLAN / ED COURSE  ?I reviewed the triage vital signs and the nursing notes. ?             ?               ? ?Differential diagnosis includes, but is not limited to, dysrhythmia, electrolyte abnormality, pneumonia, CHF, asthma, PE, ACS ? ?Patient presenting to the ER for evaluation of symptoms as described above.  Clinically well-appearing in no acute distress hemodynamically stable.  EKG nonischemic.  Chest x-ray ordered and on my review and interpretation does not show any evidence of pneumothorax.  No congestive heart failure.  She is Allers by Rock Nephew criteria PERC  negative.  She is not hypoxic.   Given her clinical well appearance and duration of symptoms will observe and monitor for any signs of dysrhythmia will check troponin will send D-dimer. ? ? ?Clinical Course as of 04/01/22 1309  ?Fri Apr 01, 2022  ?1216 Patient's troponin negative.  Does not seem consistent with ACS.  Electrolytes otherwise normal.  D-dimer negative.  No sign of congestive heart failure.  She is mildly tachycardic seems to be symptomatic from this discussed options for close outpatient follow-up versus trial of low-dose propranolol in the interim.  She would like to try propanolol. [PR]  ?  ?Clinical Course User Index ?[PR] Merlyn Lot, MD  ? ? ? ?FINAL CLINICAL IMPRESSION(S) / ED DIAGNOSES  ? ?Final diagnoses:  ?Palpitations  ? ? ? ?Rx / DC Orders  ? ?ED Discharge Orders   ? ?      Ordered  ?  propranolol (INDERAL) 10 MG tablet  2 times daily PRN       ? 04/01/22 1304  ? ?  ?  ? ?  ? ? ? ?Note:  This document was prepared using Dragon voice recognition software and may include unintentional dictation errors. ? ?  ?Merlyn Lot, MD ?04/01/22 1309 ? ?

## 2022-04-04 ENCOUNTER — Telehealth: Payer: Self-pay | Admitting: Cardiovascular Disease

## 2022-04-04 NOTE — Telephone Encounter (Signed)
[facility] [facility] fu  ?

## 2022-04-05 NOTE — Progress Notes (Signed)
Cardiology Office Note ? ?Date:  04/06/2022  ? ?ID:  Kathy Vega, DOB 1975-12-26, MRN [id number] ? ?PCP:  [facility] [place]  ? ?Chief Complaint  ?Patient presents with  ? Palpitations  ?  Patient c/o palpitations, shortness of breath and chest pain. Medications reviewed by the patient verbally.   ? ? ?HPI:  ?[name] is a 46 year old woman with past medical history of ?Admitted to the hospital 08/13/20 with COVID-19 pneumonia & ARDS requiring intubation on 9/17 and mechanical ventilation.   ?Proning protocols were followed while in ICU.   required maximal ventilation support with sedation and remained critically ill.  She was weaned to FiO2 60% on 10/1.  Tracheostomy was placed on 09/04/20.  Patient began tolerating trach collar O2 in short durations 10/12-13 and began OOB to chair.  ? tracheal aspirate culture growing Pseudomonas. ?Sepsis resolved before discharge. ?history of morbid obesity, ?Who presents for follow-up of her shortness of breath, mildly decreased ejection fraction September 2022 in the setting of anemia, heavy menses,  leg edema, chest pain, irregular beats ? ?Last seen in clinic March 09, 2022 ?Echocardiogram reviewed on today's visit ?Ejection fraction improving compared to prior study in 07/2021 ?Previously 45 to 50% now ejection fraction 50 to 55% ?No indication of high pressures, no indication of valvular heart disease ?Given low blood pressure on last clinic visit, we will not change her medications at this time ?Persistent anemia could contribute to her shortness of breath ? ?Was seen in the emergency room Apr 01, 2022 for palpitations ?Records reviewed in detail ?Blood pressure was elevated 143/110 ?Work-up negative in the ER, started on propranolol 10 twice daily ? ?Hemoglobin of 10 ? ?Followed by pulmonary, using her suction device, has trach in place ?As indicated ideally she would like to have trach taken out ? ?Has some leg swelling ? ?EKG personally reviewed by  myself on todays visit ?Normal sinus rhythm nonspecific T wave abnormality precordial leads ? ?Other past medical history reviewed ?Oxygen machine at night ? ?Problems with anemia, hemoglobin down in the 6 range requiring transfusion during September 2022 ? ? ?PMH:   has a past medical history of Anemia, Cervical cancer ([place]), and Thyroid disease. ? ?PSH:    ?Past Surgical History:  ?Procedure Laterality Date  ? CARDIAC CATHETERIZATION    ? CERVICAL BIOPSY  W/ LOOP ELECTRODE EXCISION    ? CESAREAN SECTION    ? CHOLECYSTECTOMY    ? LEG SURGERY    ? TRACHEOSTOMY TUBE PLACEMENT N/A 09/04/2020  ? Procedure: TRACHEOSTOMY;  Surgeon: Beverly Gust, MD;  Location: [facility] ORS;  Service: ENT;  Laterality: N/A;  ? ? ?Current Outpatient Medications  ?Medication Sig Dispense Refill  ? albuterol (PROVENTIL) (2.5 MG/3ML) 0.083% nebulizer solution Take 3 mLs (2.5 mg total) by nebulization every 6 (six) hours as needed for wheezing or shortness of breath. 360 mL 11  ? ferrous sulfate 325 (65 FE) MG EC tablet Take 1 tablet (325 mg total) by mouth every other day. With breakfast    ? levothyroxine (SYNTHROID, LEVOTHROID) 125 MCG tablet Take 125 mcg by mouth daily before breakfast.    ? propranolol (INDERAL) 10 MG tablet Take 1 tablet (10 mg total) by mouth 2 (two) times daily as needed (palpitations). 30 tablet 1  ? doxycycline (VIBRA-TABS) 100 MG tablet Take 1 tablet (100 mg total) by mouth 2 (two) times daily. (Patient not taking: Reported on 03/09/2022) 14 tablet 0  ? lidocaine (LIDODERM) 5 % Place 1  patch onto the skin every 12 (twelve) hours. Remove & Discard patch within 12 hours or as directed by MD (Patient not taking: Reported on 03/09/2022) 10 patch 0  ? medroxyPROGESTERone (PROVERA) 10 MG tablet Take 1 tablet (10 mg total) by mouth daily. Use for ten days (Patient not taking: Reported on 03/09/2022) 10 tablet 2  ? norethindrone (AYGESTIN) 5 MG tablet Take 1 tablet (5 mg total) by mouth 2 (two) times daily for 21 days.  (Patient not taking: Reported on 03/09/2022) 42 tablet 0  ? norethindrone (MICRONOR) 0.35 MG tablet Take 1 tablet (0.35 mg total) by mouth daily. (Patient not taking: Reported on 03/09/2022) 28 tablet 0  ? predniSONE (DELTASONE) 10 MG tablet 4 tabs for 2 days, then 3 tabs for 2 days, 2 tabs for 2 days, then 1 tab for 2 days, then stop (Patient not taking: Reported on 03/09/2022) 20 tablet 0  ? Zinc 220 (50 Zn) MG CAPS Take 1 capsule by mouth daily. (Patient not taking: Reported on 03/09/2022) 90 capsule 0  ? ?No current facility-administered medications for this visit.  ? ? ?Allergies:   Patient has no known allergies.  ? ?Social History:  The patient  reports that she quit smoking about 23 years ago. Her smoking use included cigarettes. She has a 2.50 pack-year smoking history. She has never used smokeless tobacco. She reports that she does not drink alcohol and does not use drugs.  ? ?Family History:   family history includes Heart disease in her father; Hyperlipidemia in her mother; Hypertension in her mother; Liver cancer in her father.  ? ? ?Review of Systems: ?Review of Systems  ?Constitutional: Negative.   ?HENT: Negative.    ?Respiratory: Negative.    ?Cardiovascular: Negative.   ?Gastrointestinal: Negative.   ?Musculoskeletal: Negative.   ?Neurological: Negative.   ?Psychiatric/Behavioral: Negative.    ?All other systems reviewed and are negative. ? ? ?PHYSICAL EXAM: ?VS:  Ht '5\' 9"'$  (1.753 m)   Wt 290 lb 2 oz (131.6 kg)   BMI 42.84 kg/m?  , BMI Body mass index is 42.84 kg/m?Marland Kitchen ?Constitutional:  oriented to person, place, and time. No distress.  ?HENT:  ?Head: Grossly normal ?Eyes:  no discharge. No scleral icterus.  ?Neck: No JVD, no carotid bruits  ?Cardiovascular: Regular rate and rhythm, no murmurs appreciated ?Trace ankle swelling ?Pulmonary/Chest: Clear to auscultation bilaterally, no wheezes or rales ?Abdominal: Soft.  no distension.  no tenderness.  ?Musculoskeletal: Normal range of motion ?Neurological:   normal muscle tone. Coordination normal. No atrophy ?Skin: Skin warm and dry ?Psychiatric: normal affect, pleasant ? ? ?Recent Labs: ?07/29/2021: B Natriuretic Peptide 11.0 ?02/09/2022: TSH 149.000 ?04/01/2022: ALT 15; BUN 10; Creatinine, Ser 0.79; Hemoglobin 10.0; Platelets 308; Potassium 3.6; Sodium 139  ? ? ?Lipid Panel ?Lab Results  ?Component Value Date  ? TRIG 1,906 (H) 08/17/2020  ? ?  ? ?Wt Readings from Last 3 Encounters:  ?04/06/22 290 lb 2 oz (131.6 kg)  ?04/01/22 280 lb (127 kg)  ?03/14/22 284 lb 6.3 oz (129 kg)  ?  ? ? ?ASSESSMENT AND PLAN: ? ?Problem List Items Addressed This Visit   ? ? Acute renal failure (ARF) (HCC)  ? Acute respiratory failure (HCC) - Primary  ? ?Other Visit Diagnoses   ? ? Dilated cardiomyopathy ([place])      ? SOB (shortness of breath)      ? Leg edema      ? ?  ?Shortness of breath ?Ejection fraction low normal on recent  echocardiogram performed last month ?Remains anemic hemoglobin of 10 which may be contributing to symptoms ?Continued secretions requiring suctioning ?Low risk for underlying ischemia ?She does have some ankle swelling, recommend she start Lasix 20 mg daily as needed with potassium daily as needed for ankle swelling ?Recommend she moderate her fluid intake ?Discussed weight loss strategies ?Ideally may benefit from weight loss injection medication ? ?Pneumonia, history of COVID, still with trach ?Significant events in 2021 ?Complicated course in the ICU required intubation, trach aggressive therapy ?Symptoms stable ?Still with trach, followed by pulmonary ?Recommended weight loss ? ?Anemia ?Hemoglobin low but stable 10 ?Was hospitalized September 2022 for anemia required transfusion ?Previously reported heavy menses ? ?Cardiomyopathy ?Ejection fraction 50% on echocardiogram  ?Lasix 20 as above with potassium as needed for ankle swelling ?On propranolol 10 twice daily, refill provided ? ?Irregular heartbeat ?Etiology unclear, ZIO monitor reviewed showing normal sinus  rhythm rare ectopy ?Normal EKG on today's visit ?Symptoms improved with propranolol ?If needed could change the propranolol as needed to bisoprolol daily ? ? ? Total encounter time more than 40 minutes ? Greater

## 2022-04-06 ENCOUNTER — Ambulatory Visit (INDEPENDENT_AMBULATORY_CARE_PROVIDER_SITE_OTHER): Payer: Medicaid Other | Admitting: Cardiovascular Disease

## 2022-04-06 ENCOUNTER — Encounter: Payer: Self-pay | Admitting: Cardiovascular Disease

## 2022-04-06 VITALS — BP 130/82 | HR 83 | Ht 69.0 in | Wt 290.1 lb

## 2022-04-06 DIAGNOSIS — R600 Localized edema: Secondary | ICD-10-CM

## 2022-04-06 DIAGNOSIS — R002 Palpitations: Secondary | ICD-10-CM | POA: Diagnosis not present

## 2022-04-06 DIAGNOSIS — J9601 Acute respiratory failure with hypoxia: Secondary | ICD-10-CM

## 2022-04-06 DIAGNOSIS — R0602 Shortness of breath: Secondary | ICD-10-CM | POA: Diagnosis not present

## 2022-04-06 DIAGNOSIS — I420 Dilated cardiomyopathy: Secondary | ICD-10-CM | POA: Diagnosis not present

## 2022-04-06 DIAGNOSIS — N170 Acute kidney failure with tubular necrosis: Secondary | ICD-10-CM

## 2022-04-06 MED ORDER — POTASSIUM CHLORIDE ER 20 MEQ PO TBCR: 10.0000 meq | EXTENDED_RELEASE_TABLET | Freq: Every day | ORAL | 3 refills | Status: DC | PRN

## 2022-04-06 MED ORDER — PROPRANOLOL HCL 10 MG PO TABS: 10.0000 mg | ORAL_TABLET | Freq: Three times a day (TID) | ORAL | 6 refills | Status: DC | PRN

## 2022-04-06 MED ORDER — FUROSEMIDE 20 MG PO TABS: 20.0000 mg | ORAL_TABLET | Freq: Every day | ORAL | 3 refills | Status: AC | PRN

## 2022-04-06 NOTE — Patient Instructions (Signed)
Medication Instructions:  ?Lasix /furosemide 20 mg daily as needed with potassium 20 meq for ankle swelling or shortness of breath ? ?If you need a refill on your cardiac medications before your next appointment, please call your pharmacy.  ? ?Lab work: ?No new labs needed ? ?Testing/Procedures: ?No new testing needed ? ?Follow-Up: ?At [facility], you and your health needs are our priority.  As part of our continuing mission to provide you with exceptional heart care, we have created designated Provider Care Teams.  These Care Teams include your primary Cardiologist (physician) and Advanced Practice Providers (APPs -  Physician Assistants and Nurse Practitioners) who all work together to provide you with the care you need, when you need it. ? ?You will need a follow up appointment in 6 months ? ?Providers on your designated Care Team:   ?Murray Hodgkins, NP ?Christell Faith, PA-C ?Cadence Kathlen Mody, PA-C ? ?COVID-19 Vaccine Information can be found at: [email or web address].co.uk For questions related to vaccine distribution or appointments, please email [email or web address] or call [phone].  ? ?

## 2022-04-27 ENCOUNTER — Emergency Department
Admission: EM | Admit: 2022-04-27 | Discharge: 2022-04-27 | Disposition: A | Discharge status: Home / Self Care | Payer: Medicaid Other | Attending: Emergency Medicine | Admitting: Emergency Medicine

## 2022-04-27 ENCOUNTER — Emergency Department: Payer: Medicaid Other

## 2022-04-27 ENCOUNTER — Other Ambulatory Visit: Payer: Self-pay

## 2022-04-27 DIAGNOSIS — J9503 Malfunction of tracheostomy stoma: Secondary | ICD-10-CM | POA: Diagnosis not present

## 2022-04-27 DIAGNOSIS — R0602 Shortness of breath: Secondary | ICD-10-CM | POA: Diagnosis present

## 2022-04-27 DIAGNOSIS — J45909 Unspecified asthma, uncomplicated: Secondary | ICD-10-CM | POA: Diagnosis not present

## 2022-04-27 DIAGNOSIS — Z20822 Contact with and (suspected) exposure to covid-19: Secondary | ICD-10-CM | POA: Diagnosis not present

## 2022-04-27 DIAGNOSIS — J9509 Other tracheostomy complication: Secondary | ICD-10-CM

## 2022-04-27 LAB — RESP PANEL BY RT-PCR (FLU A&B, COVID) ARPGX2
Influenza A by PCR: NEGATIVE
Influenza B by PCR: NEGATIVE
SARS Coronavirus 2 by RT PCR: NEGATIVE

## 2022-04-27 LAB — CBC WITH DIFFERENTIAL/PLATELET
Abs Immature Granulocytes: 0.02 10*3/uL (ref 0.00–0.07)
Basophils Absolute: 0 10*3/uL (ref 0.0–0.1)
Basophils Relative: 1 %
Eosinophils Absolute: 0.1 10*3/uL (ref 0.0–0.5)
Eosinophils Relative: 1 %
HCT: 35.6 % — ABNORMAL LOW (ref 36.0–46.0)
Hemoglobin: 10.9 g/dL — ABNORMAL LOW (ref 12.0–15.0)
Immature Granulocytes: 0 %
Lymphocytes Relative: 15 %
Lymphs Abs: 1.2 10*3/uL (ref 0.7–4.0)
MCH: 24.3 pg — ABNORMAL LOW (ref 26.0–34.0)
MCHC: 30.6 g/dL (ref 30.0–36.0)
MCV: 79.5 fL — ABNORMAL LOW (ref 80.0–100.0)
Monocytes Absolute: 0.5 10*3/uL (ref 0.1–1.0)
Monocytes Relative: 6 %
Neutro Abs: 6.4 10*3/uL (ref 1.7–7.7)
Neutrophils Relative %: 77 %
Platelets: 323 10*3/uL (ref 150–400)
RBC: 4.48 MIL/uL (ref 3.87–5.11)
RDW: 17.4 % — ABNORMAL HIGH (ref 11.5–15.5)
WBC: 8.2 10*3/uL (ref 4.0–10.5)
nRBC: 0 % (ref 0.0–0.2)

## 2022-04-27 LAB — BRAIN NATRIURETIC PEPTIDE: B Natriuretic Peptide: 12.3 pg/mL (ref 0.0–100.0)

## 2022-04-27 LAB — BASIC METABOLIC PANEL
Anion gap: 10 (ref 5–15)
BUN: 10 mg/dL (ref 6–20)
CO2: 26 mmol/L (ref 22–32)
Calcium: 9.7 mg/dL (ref 8.9–10.3)
Chloride: 100 mmol/L (ref 98–111)
Creatinine, Ser: 0.89 mg/dL (ref 0.44–1.00)
GFR, Estimated: >60 mL/min (ref >60)
Glucose, Bld: 128 mg/dL — ABNORMAL HIGH (ref 70–99)
Potassium: 3.6 mmol/L (ref 3.5–5.1)
Sodium: 136 mmol/L (ref 135–145)

## 2022-04-27 MED ORDER — FUROSEMIDE 10 MG/ML IJ SOLN
40.0000 mg | Freq: Once | INTRAMUSCULAR | Status: AC
  Administered 2022-04-27: 40 mg via INTRAVENOUS
  Filled 2022-04-27: qty 4

## 2022-04-27 NOTE — Discharge Instructions (Signed)
Follow-up with your ENT at [facility] and with your primary care.  Return to the ER for new, worsening, or persistent shortness of breath, mucous plugs, obstruction of your tracheostomy, fever, chest pain, swelling, or any other new or worsening symptoms that concern you.

## 2022-04-27 NOTE — ED Provider Notes (Signed)
[facility] Provider Note    Event Date/Time   First MD Initiated Contact with Patient 04/27/22 0840     (approximate)   History   Shortness of Breath   HPI  Kathy Vega is a 46 y.o. 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  She attempted to suction at home with no success.  She denies cough or fever.  She has no chest pain.  She does report some generalized edema over the last month which is acutely unchanged today.    Physical Exam   Triage Vital Signs: ED Triage Vitals  Enc Vitals Group     BP 04/27/22 0845 (!) 159/103     Pulse Rate 04/27/22 0839 (!) 110     Resp 04/27/22 0839 (!) 24     Temp 04/27/22 0844 98 F (36.7 C)     Temp src --      SpO2 04/27/22 0839 100 %     Weight 04/27/22 0840 290 lb 2 oz (131.6 kg)     Height 04/27/22 0840 '5\' 9"'$  (1.753 m)     Head Circumference --      Peak Flow --      Pain Score 04/27/22 0839 3     Pain Loc --      Pain Edu? --      Excl. in [place]? --     Most recent vital signs: Vitals:   04/27/22 0945 04/27/22 1000  BP:    Pulse: 84 96  Resp: 12 19  Temp:    SpO2: 96% 96%     General: Alert, uncomfortable appearing but in no acute distress. CV:  Good peripheral perfusion.  Normal heart sounds. Resp:  Slightly increased respiratory effort.  Lungs CTAB. Abd:  No distention.  Other:  Tracheostomy in place with no active secretions or drainage.  Stridorous breath sounds.  Mild generalized edema, nonpitting.   ED Results / Procedures / Treatments   Labs (all labs ordered are listed, but only abnormal results are displayed) Labs Reviewed  BASIC METABOLIC PANEL - Abnormal; Notable for the following components:      Result Value   Glucose, Bld 128 (*)    All other components within normal limits  CBC WITH  DIFFERENTIAL/PLATELET - Abnormal; Notable for the following components:   Hemoglobin 10.9 (*)    HCT 35.6 (*)    MCV 79.5 (*)    MCH 24.3 (*)    RDW 17.4 (*)    All other components within normal limits  RESP PANEL BY RT-PCR (FLU A&B, COVID) [id number]  BRAIN NATRIURETIC PEPTIDE     EKG     RADIOLOGY  Chest x-ray: I independently viewed and interpreted the images; there are bilateral interstitial opacities, slightly worsened when compared to x-ray from 04/01/2022  PROCEDURES:  Critical Care performed: Yes, see critical care procedure note(s)  .Critical Care Performed by: Arta Silence, MD Authorized by: Arta Silence, MD   Critical care provider statement:    Critical care time (minutes):  30   Critical care was necessary to treat or prevent imminent or life-threatening deterioration of the following conditions:  Respiratory failure   Critical care was time spent personally by me on the following activities:  Development of treatment plan with patient or surrogate, discussions with consultants, evaluation of patient's response  to treatment, examination of patient, ordering and review of laboratory studies, ordering and review of radiographic studies, ordering and performing treatments and interventions, pulse oximetry, re-evaluation of patient's condition and review of old [place] ED: Medications  furosemide (LASIX) injection 40 mg (40 mg Intravenous Given 04/27/22 0951)     IMPRESSION / MDM / [place] / ED COURSE  I reviewed the triage vital signs and the nursing notes.  46 year old female with PMH as noted above presents with shortness of breath and stridor this morning; she feels like her tracheostomy is plugged and tried to suction it at home without success.  I reviewed the past medical records.  The patient initially had the tracheostomy placed in 2021 after she was intubated for COVID.  She follows with ENT at [facility] and was  most recently seen there on 03/10/2022.  The patient had been switched to a Montgomery cannula and she is planned for eventual decannulation.  She had been reporting increased mucus and secretions at that time.  On exam the patient is slightly tachycardic and tachypneic with otherwise normal vital signs.  Per EMS, her O2 saturation was in the mid to high 90s on room air although we have placed a nonrebreather on the trach for patient comfort.  The patient demonstrates some increased work of breathing but is able to speak without significant difficulty.  The lungs are clear to auscultation.  There is minimal edema.  Differential diagnosis includes, but is not limited to, mucous plugging, other tracheostomy malfunction, airway stenosis, or lower respiratory infection or inflammation.  Patient's presentation is most consistent with acute presentation with potential threat to life or bodily function.  We will obtain a chest x-ray, basic labs, attempt to suction the tracheostomy and if this is unsuccessful I will call ENT.    The patient is on the cardiac monitor to evaluate for evidence of arrhythmia and/or significant heart rate changes.   ----------------------------------------- 8:56 AM on 04/27/2022 -----------------------------------------  Respiratory was unable to relieve the symptoms with suctioning.  I have consulted Dr. Richardson Landry from ENT who will come to evaluate the patient.  ----------------------------------------- 9:53 AM on 04/27/2022 -----------------------------------------  Dr. Richardson Landry was able to remove and replace the tracheostomy cannula and relieve the obstruction under video laryngoscopy.  The patient's acute airway symptoms are now resolved and she appears comfortable.  CBC and CMP are unremarkable.  The chest x-ray shows bilateral interstitial opacities consistent with mild edema or possible viral or atypical infection although based on my review this is only minimally  increased when compared to a prior chest x-ray from earlier this month.  The patient denies cough, fever, or any significant increase shortness of breath prior to the acute airway obstruction episode today, although as above she does report some generalized edema.  I reviewed an echocardiogram from last year which showed an EF of 55%.  It is possible that she is having mild edema although she does not carry a diagnosis of CHF.  Clinically she has no evidence of pneumonia.  We will obtain a BNP, respiratory panel, and give a dose of IV Lasix.  ----------------------------------------- 11:11 AM on 04/27/2022 -----------------------------------------  The patient continues to feel well and is currently asymptomatic.  She would like to go home.  Respiratory panel and BNP are both negative.  There is no clinical evidence of pneumonia or indication for antibiotics at this time.  I counseled the patient on the results of the work-up including the x-ray  findings.  I instructed her to follow-up with her ENT at [facility] and with her primary care.  She does not require any prescriptions at this time.  Return precautions given, she expresses understanding.    FINAL CLINICAL IMPRESSION(S) / ED DIAGNOSES   Final diagnoses:  Tracheostomy obstruction ([place])     Rx / DC Orders   ED Discharge Orders     None        Note:  This document was prepared using Dragon voice recognition software and may include unintentional dictation errors.    Arta Silence, MD 04/27/22 1112

## 2022-04-27 NOTE — ED Triage Notes (Signed)
Patient to ER via [facility] from home. Reports feeling well yesterday, states that this morning she felt like she had mucus plug deep inside her trach that she has been unable to remove with suction. Hx of the same. Lurline Idol has been present for 3 years. Room air sats 97% with EMS. Placed on 15L via NRB for comfort.   RT at bedside with EMS arrival.

## 2022-04-27 NOTE — Consult Note (Signed)
Kathy Vega, Kathy Vega [id number] December 08, 1975 Riley Nearing, MD  Reason for Consult: presumed occluded tracheostomy tube Requesting Physician: Arta Silence, MD Consulting Physician: Riley Nearing  HPI: This 46 y.o. year old female was admitted on 04/27/2022 for Difficulty Breathing, EMS. 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  She attempted to suction at home with no success.  She denies cough or fever.  She has no chest pain.  She does report some generalized edema over the last month which is acutely unchanged today. 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 Barbee Shropshire.  She underwent excision of subglottic stenosis with dilation in May 2022, as well as balloon dilation in September 2022.  She had a Montgomery tracheostomy cannula placed in March.  At her last visit apparently she had some obstruction in the cannula and had a nonworking suction at home.  She had been cleaning the cannula as best she could with a Q-tip at home.    Allergies: No Known Allergies  Medications: (Not in a hospital admission) .  No current facility-administered medications for this encounter.   Current Outpatient Medications  Medication Sig Dispense Refill   albuterol (PROVENTIL) (2.5 MG/3ML) 0.083% nebulizer solution Take 3 mLs (2.5 mg total) by nebulization every 6 (six) hours as needed for wheezing or shortness of breath. 360 mL 11   doxycycline (VIBRA-TABS) 100 MG tablet Take 1 tablet (100 mg total) by mouth 2 (two) times daily. (Patient not taking: Reported on 03/09/2022) 14 tablet 0   ferrous sulfate 325 (65 FE) MG EC tablet Take 1 tablet (325 mg total) by mouth every other day. With breakfast     furosemide (LASIX) 20 MG tablet Take 1 tablet (20 mg total) by mouth daily as needed for edema (shortness of  breath). 90 tablet 3   levothyroxine (SYNTHROID, LEVOTHROID) 125 MCG tablet Take 125 mcg by mouth daily before breakfast.     lidocaine (LIDODERM) 5 % Place 1 patch onto the skin every 12 (twelve) hours. Remove & Discard patch within 12 hours or as directed by MD (Patient not taking: Reported on 03/09/2022) 10 patch 0   medroxyPROGESTERone (PROVERA) 10 MG tablet Take 1 tablet (10 mg total) by mouth daily. Use for ten days (Patient not taking: Reported on 03/09/2022) 10 tablet 2   norethindrone (AYGESTIN) 5 MG tablet Take 1 tablet (5 mg total) by mouth 2 (two) times daily for 21 days. (Patient not taking: Reported on 03/09/2022) 42 tablet 0   norethindrone (MICRONOR) 0.35 MG tablet Take 1 tablet (0.35 mg total) by mouth daily. (Patient not taking: Reported on 03/09/2022) 28 tablet 0   potassium chloride 20 MEQ TBCR Take 10 mEq by mouth daily as needed (to take on days you take fluid pill). 90 tablet 3   predniSONE (DELTASONE) 10 MG tablet 4 tabs for 2 days, then 3 tabs for 2 days, 2 tabs for 2 days, then 1 tab for 2 days, then stop (Patient not taking: Reported on 03/09/2022) 20 tablet 0   propranolol (INDERAL) 10 MG tablet Take 1 tablet (10 mg total) by mouth 3 (three) times daily as needed (palpitations). 90 tablet 6   Zinc 220 (50 Zn) MG CAPS Take 1 capsule by mouth daily. (Patient not taking: Reported on 03/09/2022) 90 capsule 0  PMH:  Past Medical History:  Diagnosis Date   Anemia    Cervical cancer ([place])    Thyroid disease     Fam Hx:  Family History  Problem Relation Age of Onset   Hyperlipidemia Mother    Hypertension Mother    Heart disease Father    Liver cancer Father    Breast cancer Neg Hx    Ovarian cancer Neg Hx     Soc Hx:  Social History   Socioeconomic History   Marital status: Married    Spouse name: Not on file   Number of children: Not on file   Years of education: Not on file   Highest education level: Not on file  Occupational History   Not on file  Tobacco  Use   Smoking status: Former    Packs/day: 0.50    Years: 5.00    Pack years: 2.50    Types: Cigarettes    Quit date: 2000    Years since quitting: 23.4   Smokeless tobacco: Never  Vaping Use   Vaping Use: Never used  Substance and Sexual Activity   Alcohol use: No   Drug use: Never   Sexual activity: Yes    Birth control/protection: None  Other Topics Concern   Not on file  Social History Narrative   Not on file   Social Determinants of Health   Financial Resource Strain: Not on file  Food Insecurity: Not on file  Transportation Needs: Not on file  Physical Activity: Not on file  Stress: Not on file  Social Connections: Not on file  Intimate Partner Violence: Not on file    PSH:  Past Surgical History:  Procedure Laterality Date   CARDIAC CATHETERIZATION     CERVICAL BIOPSY  W/ [place] N/A 09/04/2020   Procedure: TRACHEOSTOMY;  Surgeon: Beverly Gust, MD;  Location: [facility] ORS;  Service: ENT;  Laterality: N/A;  . Procedures since admission: No admission procedures for hospital encounter.  ROS: Review of systems normal other than 12 systems except per HPI.  PHYSICAL EXAM Vitals:  Vitals:   04/27/22 0945 04/27/22 1000  BP:    Pulse: 84 96  Resp: 12 19  Temp:    SpO2: 96% 96%  . General: Well-developed, Well-nourished in no acute distress Mood: Mood and affect well adjusted, pleasant and cooperative. Orientation: Grossly alert and oriented. Vocal Quality: No hoarseness. Communicates verbally. head and Face: NCAT. No facial asymmetry. No visible skin lesions. No significant facial scars. No tenderness with sinus percussion. Facial strength normal and symmetric. Indirect Laryngoscopy/Nasopharyngoscopy: Visualization of the larynx, hypopharynx and nasopharynx is not possible in this setting with routine examination. Neck: Supple and symmetric with no  palpable masses, tenderness or crepitance. There is a Montgomery cannula in place was scant crusting around the opening.  Nothing obstructive visually. Lymphatic: Cervical lymph nodes are without palpable lymphadenopathy or tenderness. Respiratory: mild labored breathing through tracheostomy with supplemental O2 Cardiovascular: Carotid pulse shows regular rate and rhythm Neurologic: Cranial Nerves II through XII are grossly intact. Eyes: Gaze and Ocular Motility are grossly normal. PERRLA. No visible nystagmus.  MEDICAL DECISION MAKING: Data Review:  Results for orders placed or performed during the hospital encounter of 04/27/22 (from the past 48 hour(s))  Basic metabolic panel     Status: Abnormal   Collection Time: 04/27/22  8:45 AM  Result Value Ref Range   Sodium 136 135 - 145 mmol/L   Potassium 3.6 3.5 - 5.1 mmol/L   Chloride 100 98 - 111 mmol/L   CO2 26 22 - 32 mmol/L   Glucose, Bld 128 (H) 70 - 99 mg/dL    Comment: Glucose reference range applies only to samples taken after fasting for at least 8 hours.   BUN 10 6 - 20 mg/dL   Creatinine, Ser 0.89 0.44 - 1.00 mg/dL   Calcium 9.7 8.9 - 10.3 mg/dL   GFR, Estimated >60 >60 mL/min    Comment: (NOTE) Calculated using the CKD-EPI Creatinine Equation (2021)    Anion gap 10 5 - 15    Comment: Performed at [facility], [address] [place], [place] [address]  CBC with Differential     Status: Abnormal   Collection Time: 04/27/22  8:45 AM  Result Value Ref Range   WBC 8.2 4.0 - 10.5 K/uL   RBC 4.48 3.87 - 5.11 MIL/uL   Hemoglobin 10.9 (L) 12.0 - 15.0 g/dL   HCT 35.6 (L) 36.0 - 46.0 %   MCV 79.5 (L) 80.0 - 100.0 fL   MCH 24.3 (L) 26.0 - 34.0 pg   MCHC 30.6 30.0 - 36.0 g/dL   RDW 17.4 (H) 11.5 - 15.5 %   Platelets 323 150 - 400 K/uL   nRBC 0.0 0.0 - 0.2 %   Neutrophils Relative % 77 %   Neutro Abs 6.4 1.7 - 7.7 K/uL   Lymphocytes Relative 15 %   Lymphs Abs 1.2 0.7 - 4.0 K/uL   Monocytes Relative 6 %   Monocytes  Absolute 0.5 0.1 - 1.0 K/uL   Eosinophils Relative 1 %   Eosinophils Absolute 0.1 0.0 - 0.5 K/uL   Basophils Relative 1 %   Basophils Absolute 0.0 0.0 - 0.1 K/uL   Immature Granulocytes 0 %   Abs Immature Granulocytes 0.02 0.00 - 0.07 K/uL    Comment: Performed at [facility], [address] [place], [place] [address]  Brain natriuretic peptide     Status: None   Collection Time: 04/27/22  8:45 AM  Result Value Ref Range   B Natriuretic Peptide 12.3 0.0 - 100.0 pg/mL    Comment: Performed at [facility], [address] [place], [place] [address]  Resp Panel by RT-PCR (Flu A&B, Covid) Anterior Nasal Swab     Status: None   Collection Time: 04/27/22  9:50 AM   Specimen: Anterior Nasal Swab  Result Value Ref Range   SARS Coronavirus 2 by RT PCR NEGATIVE NEGATIVE    Comment: (NOTE) SARS-CoV-2 target nucleic acids are NOT DETECTED.  The SARS-CoV-2 RNA is generally detectable in upper respiratory specimens during the acute phase of infection. The lowest concentration of SARS-CoV-2 viral copies this assay can detect is 138 copies/mL. A negative result does not preclude SARS-Cov-2 infection and should not be used as the sole basis for treatment or other patient management decisions. A negative result may occur with  improper specimen collection/handling, submission of specimen other than nasopharyngeal swab, presence of viral mutation(s) within the areas targeted by this assay, and inadequate number of viral copies(<138 copies/mL). A negative result must be combined with clinical observations, patient history, and epidemiological information. The expected result is Negative.  Fact Sheet for Patients:  EntrepreneurPulse.com.au  Fact Sheet for Healthcare Providers:  IncredibleEmployment.be  This test is no t yet approved or cleared by the [place] FDA and  has  been authorized for detection and/or diagnosis of SARS-CoV-2  by FDA under an Emergency Use Authorization (EUA). This EUA will remain  in effect (meaning this test can be used) for the duration of the COVID-19 declaration under Section 564(b)(1) of the Act, 21 U.S.C.section 360bbb-3(b)(1), unless the authorization is terminated  or revoked sooner.       Influenza A by PCR NEGATIVE NEGATIVE   Influenza B by PCR NEGATIVE NEGATIVE    Comment: (NOTE) The Xpert Xpress SARS-CoV-2/FLU/RSV plus assay is intended as an aid in the diagnosis of influenza from Nasopharyngeal swab specimens and should not be used as a sole basis for treatment. Nasal washings and aspirates are unacceptable for Xpert Xpress SARS-CoV-2/FLU/RSV testing.  Fact Sheet for Patients: EntrepreneurPulse.com.au  Fact Sheet for Healthcare Providers: IncredibleEmployment.be  This test is not yet approved or cleared by the [place] FDA and has been authorized for detection and/or diagnosis of SARS-CoV-2 by FDA under an Emergency Use Authorization (EUA). This EUA will remain in effect (meaning this test can be used) for the duration of the COVID-19 declaration under Section 564(b)(1) of the Act, 21 U.S.C. section 360bbb-3(b)(1), unless the authorization is terminated or revoked.  Performed at [facility], [address], [place], [place] [address]   . DG Chest Port 1 View  Result Date: 04/27/2022 CLINICAL DATA:  Shortness of breath EXAM: PORTABLE CHEST 1 VIEW COMPARISON:  04/01/2022 FINDINGS: Stable mild cardiomegaly. Bilateral perihilar and bibasilar interstitial opacities, more pronounced on the right. No pleural effusion. No pneumothorax. IMPRESSION: Bilateral perihilar and bibasilar interstitial opacities, more pronounced on the right, may represent pulmonary edema versus atypical/viral infection. Electronically Signed   By: Davina Poke D.O.   On: 04/27/2022 09:08  .   PROCEDURE: Procedure: Diagnostic Fiberoptic  Tracheoscopy Diagnosis: history of subglottic stenosis with possible obstructive Montgomery cannula Indications: difficulty breathing Findings:there was dense mucus obstructing the distal aspect of the Montgomery cannula.  Attempts were made to remove this with a suction unsuccessfully so the Montgomery cannula was removed, cleaned with saline and a Q-tip, relieving the obstruction, and replaced.  Good placement of the cannula was confirmed endoscopically, easily visualizing the tracheal lumen Description of Procedure: After discussing procedure and risks  a flexible fiberoptic scope was passed through the tracheal cannula. Obstructing dense mucus was seen distally within the cannula.  Attempts were made to suction this clear, but this was unsuccessful.  The cannula was briefly removed, cleansed with saline and with use of a Q-tip to clear the dense mucus, and then easily replaced through the tracheal stoma.  The scope was again passed through the cannula to confirm that the cannula was properly within the lumen of the trachea.The patient tolerated the procedure well.  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  She is to follow-up with [facility] and review their recommended care protocols for this cannula further. We don't commonly use this particular tracheostomy tube which does not have a inner cannula that can be removed and cleaned.she needs to have a working suction at home so that she can suction the cannula thoroughly on a regular basis.   Riley Nearing, MD 04/27/2022 11:34 AM

## 2022-05-12 ENCOUNTER — Encounter: Payer: Self-pay | Admitting: Pulmonary Disease

## 2022-05-12 ENCOUNTER — Ambulatory Visit (INDEPENDENT_AMBULATORY_CARE_PROVIDER_SITE_OTHER): Payer: Medicaid Other | Admitting: Pulmonary Disease

## 2022-05-12 ENCOUNTER — Other Ambulatory Visit
Admission: RE | Admit: 2022-05-12 | Discharge: 2022-05-12 | Disposition: A | Discharge status: Home / Self Care | Payer: Medicaid Other | Attending: Pulmonary Disease | Admitting: Pulmonary Disease

## 2022-05-12 VITALS — BP 136/78 | HR 95 | Temp 97.7°F | Ht 69.0 in | Wt 285.4 lb

## 2022-05-12 DIAGNOSIS — E039 Hypothyroidism, unspecified: Secondary | ICD-10-CM | POA: Insufficient documentation

## 2022-05-12 DIAGNOSIS — J4540 Moderate persistent asthma, uncomplicated: Secondary | ICD-10-CM

## 2022-05-12 DIAGNOSIS — J8410 Pulmonary fibrosis, unspecified: Secondary | ICD-10-CM

## 2022-05-12 DIAGNOSIS — R5383 Other fatigue: Secondary | ICD-10-CM

## 2022-05-12 DIAGNOSIS — G4736 Sleep related hypoventilation in conditions classified elsewhere: Secondary | ICD-10-CM

## 2022-05-12 DIAGNOSIS — E669 Obesity, unspecified: Secondary | ICD-10-CM

## 2022-05-12 DIAGNOSIS — J398 Other specified diseases of upper respiratory tract: Secondary | ICD-10-CM | POA: Diagnosis not present

## 2022-05-12 LAB — TSH: TSH: 75 u[IU]/mL — ABNORMAL HIGH (ref 0.350–4.500)

## 2022-05-12 LAB — CORTISOL: Cortisol, Plasma: 5 ug/dL

## 2022-05-12 LAB — T4, FREE: Free T4: 0.29 ng/dL — ABNORMAL LOW (ref 0.61–1.12)

## 2022-05-12 MED ORDER — BUDESONIDE 0.5 MG/2ML IN SUSP: 0.5000 mg | Freq: Two times a day (BID) | RESPIRATORY_TRACT | 5 refills | Status: DC

## 2022-05-12 NOTE — Patient Instructions (Signed)
We have sent a request for another nebulizer machine.  Your albuterol medication for the nebulizer should be used at least twice a day.  We will receive another medication called Pulmicort (budesonide) that you will use twice a day through the nebulizer.  Make sure that you do the albuterol first and then followed with the Pulmicort.  You need to have your speaking valve off of the trach when you are using the nebulizer.  We have ordered some blood work to check on your thyroid function.  We have sent a referral for the endocrinologist (thyroid doctor) to see you.  We will see you in follow-up here in 3 months time call sooner should any new problems arise.

## 2022-05-12 NOTE — Progress Notes (Signed)
Subjective:    Patient ID: Kathy Vega, female    DOB: 02/21/1976, 46 y.o.   MRN: [id number] Patient Care Team: [facility], [facility] as PCP - General (General Practice)  Chief Complaint  Patient presents with   Follow-up    SOB with exertion.    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  Patient was last seen here on 15 March by Kathy Barrow, NP.  This is a scheduled visit.  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 Beverely Risen at [facility].  Most recently she had had a [place] cannula placed but however due to recurrent mucous plugging and visits to the ED due to the same she had the trach switch back to a Shiley.  She is tolerating PMV for the most part but does get breathless sometimes and takes it off.  She does use oxygen nocturnally.  On review of her medications today it appears that she is only getting albuterol as needed and no Pulmicort.  Now she tells me she really does not have a nebulizer at all.  She has not been using her nebulizers.  She does have moderate persistent asthma and does need medications.  Inhaler chart not going to be effective due to her tracheostomy dependent status.  The other issue with Kathy Vega is her severe hypothyroidism.  TSH last measured in March was in the 149 range.  This was also noted in September.  She is on Synthroid 125 mcg daily she had endocrinology consultation was placed in September and then again in March and she however has not had a call with regards to this.  She does not have any new significant complaints.  Continues to be fatigued and very short of breath.  Still disillusioned because her trach remains due to tracheal stenosis.  Review of Systems A 10 point review of systems was performed and it is as noted above  otherwise negative.  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   Intubation of airway performed without difficulty    Pain    Tracheostomy in place [facility]) 09/18/2020   Acute respiratory failure ([place])    Pneumonia due to COVID-19 virus 08/13/2020   Acute renal failure (ARF) ([place]) 08/13/2020   Elevated liver enzymes 08/13/2020   CAP (community acquired pneumonia) 08/13/2020   Acute encephalopathy 08/13/2020   Hypokalemia 08/13/2020   Social History   Tobacco Use   Smoking status: Former    Packs/day: 0.50    Years: 5.00    Total pack years: 2.50    Types: Cigarettes    Quit date: 2000    Years since quitting: 23.4   Smokeless tobacco: Never  Substance Use Topics   Alcohol use: No   No Known Allergies Current Meds  Medication Sig   albuterol (PROVENTIL) (2.5 MG/3ML) 0.083% nebulizer solution Take 3 mLs (2.5 mg total) by nebulization every 6 (six) hours as needed for wheezing or shortness of breath.   ferrous sulfate 325 (65 FE) MG EC tablet Take 1 tablet (325 mg total) by mouth every other day. With breakfast   furosemide (LASIX) 20 MG tablet Take 1 tablet (20 mg total) by mouth daily as needed for edema (shortness of breath).   levothyroxine (SYNTHROID, LEVOTHROID)  125 MCG tablet Take 125 mcg by mouth daily before breakfast.   lidocaine (LIDODERM) 5 % Place 1 patch onto the skin every 12 (twelve) hours. Remove & Discard patch within 12 hours or as directed by MD   medroxyPROGESTERone (PROVERA) 10 MG tablet Take 1 tablet (10 mg total) by mouth daily. Use for ten days   norethindrone (MICRONOR) 0.35 MG tablet Take 1 tablet (0.35 mg total) by mouth daily.   potassium chloride 20 MEQ TBCR Take 10 mEq by mouth daily as needed (to take on days you take fluid pill).   propranolol (INDERAL) 10 MG tablet Take 1 tablet (10 mg total) by mouth 3 (three) times daily as needed (palpitations).    [DISCONTINUED] doxycycline (VIBRA-TABS) 100 MG tablet Take 1 tablet (100 mg total) by mouth 2 (two) times daily.       Objective:   Physical Exam BP 136/78 (BP Location: Left Arm, Cuff Size: Large)   Pulse 95   Temp 97.7 F (36.5 C) (Temporal)   Ht '5\' 9"'$  (1.753 m)   Wt 285 lb 6.4 oz (129.5 kg)   SpO2 97%   BMI 42.15 kg/m  GENERAL: Obese woman, no acute distress, tracheostomy in place.  He has a Passy-Muir valve and able to speak without difficulty.  Uses trach collar for supplemental oxygen at night. HEAD: Normocephalic, atraumatic.  EYES: Pupils equal, round, reactive to light.  No scleral icterus.  MOUTH: Oral mucosa moist.  No thrush. NECK: Supple. No lymphadenopathy.  Tracheostomy in place as above with Passy-Muir valve. No JVD.  Trachea midline. PULMONARY: Good air entry bilaterally.  Scattered end expiratory wheezes noted.   CARDIOVASCULAR: S1 and S2. Regular rate and rhythm.  No rubs, murmurs or gallops heard. ABDOMEN: Obese otherwise unremarkable. MUSCULOSKELETAL: No joint deformity, no clubbing, no edema.  NEUROLOGIC: No overt focal deficit. SKIN: Intact,warm,dry. PSYCH: Mood and behavior normal.     Assessment & Plan:     ICD-10-CM   1. Hypothyroidism, unspecified type  E03.9 Ambulatory referral to Endocrinology    TSH    T4, free    T3, free    Cortisol   Recheck TSH, free T4 and free T3 today Resend referral to endocrinology Will try [facility] endocrinology    2. Moderate persistent asthma without complication  O27.74 AMB REFERRAL FOR DME    budesonide (PULMICORT) 0.5 MG/2ML nebulizer solution   Use albuterol at least twice a day via neb Follow-up with Pulmicort 0.5 mg twice daily via neb Reorder nebulizer machine    3. Fatigue, unspecified type  R53.83 Cortisol   Check thyroid functions as above Check cortisol level Poorly compensated hypothyroidism may affect adrenal function    4. 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ly [facility] at [facility] This issue  adds complexity to her management    5. 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    This has not been progressive Post COVID-19     Orders Placed This Encounter  Procedures   TSH    Standing Status:   Future    Number of Occurrences:   1    Standing Expiration Date:   11/11/2022   T4, free    Standing Status:   Future    Number of Occurrences:   1    Standing Expiration Date:   11/11/2022   T3, free  Standing Status:   Future    Number of Occurrences:   1    Standing Expiration Date:   11/11/2022   Cortisol    Standing Status:   Future    Number of Occurrences:   1    Standing Expiration Date:   11/11/2022   AMB REFERRAL FOR DME    Referral Priority:   Routine    Referral Type:   Durable Medical Equipment Purchase    Number of Visits Requested:   1   Ambulatory referral to Endocrinology    Referral Priority:   Routine    Referral Type:   Consultation    Referral Reason:   Specialty Services Required    Number of Visits Requested:   1    Meds ordered this encounter  Medications   budesonide (PULMICORT) 0.5 MG/2ML nebulizer solution    Sig: Take 2 mLs (0.5 mg total) by nebulization 2 (two) times daily.    Dispense:  120 mL    Refill:  5    We have made prior referrals to endocrinology and have recommended primary care referral to endocrinology.  As we are not being successful locally, will refer the patient to [facility] endocrine and metabolism clinic.  Patient is currently on Synthroid 125 mcg daily however even with this her thyroid remains low.  She may need addition of T3 supplementation.  Will make referral to [facility] endocrine clinic.  Patient was instructed on the proper use of nebulizer and nebulizer solutions.  Prescription for Pulmicort sent to the patient's pharmacy of choice.  Patient has albuterol at home already.  Unfortunately LABAs are not covered  by patient's insurance.  She was instructed to use albuterol first and then follow-up with Pulmicort twice a day.   We will see the patient in follow-up in 3 months time she is to contact us prior to that time should any new difficulties arise.   Renold Don, MD Advanced Bronchoscopy PCCM [facility]    *This note was dictated using voice recognition software/Dragon.  Despite best efforts to proofread, errors can occur which can change the meaning. Any transcriptional errors that result from this process are unintentional and may not be fully corrected at the time of dictation.

## 2022-05-12 NOTE — Addendum Note (Signed)
Addended by: Claudette Head A on: 05/12/2022 03:33 PM   Modules accepted: Orders

## 2022-05-13 LAB — T3, FREE: T3, Free: 1.6 pg/mL — ABNORMAL LOW (ref 2.0–4.4)

## 2022-06-09 ENCOUNTER — Other Ambulatory Visit: Payer: Self-pay | Admitting: Family Medicine

## 2022-06-09 DIAGNOSIS — R2242 Localized swelling, mass and lump, left lower limb: Secondary | ICD-10-CM

## 2022-06-10 ENCOUNTER — Ambulatory Visit
Admission: RE | Admit: 2022-06-10 | Discharge: 2022-06-10 | Disposition: A | Discharge status: Home / Self Care | Payer: Medicaid Other | Source: Ambulatory Visit | Attending: Family Medicine | Admitting: Family Medicine

## 2022-06-10 DIAGNOSIS — R2242 Localized swelling, mass and lump, left lower limb: Secondary | ICD-10-CM

## 2022-07-16 ENCOUNTER — Encounter: Payer: Self-pay | Admitting: Emergency Medicine

## 2022-07-16 ENCOUNTER — Observation Stay
Admission: EM | Admit: 2022-07-16 | Discharge: 2022-07-18 | Disposition: A | Discharge status: Home / Self Care | Payer: Medicaid Other | Attending: Internal Medicine | Admitting: Internal Medicine

## 2022-07-16 ENCOUNTER — Other Ambulatory Visit: Payer: Self-pay

## 2022-07-16 ENCOUNTER — Emergency Department: Payer: Medicaid Other

## 2022-07-16 DIAGNOSIS — J45909 Unspecified asthma, uncomplicated: Secondary | ICD-10-CM | POA: Diagnosis not present

## 2022-07-16 DIAGNOSIS — E039 Hypothyroidism, unspecified: Secondary | ICD-10-CM

## 2022-07-16 DIAGNOSIS — Z930 Tracheostomy status: Secondary | ICD-10-CM | POA: Diagnosis not present

## 2022-07-16 DIAGNOSIS — N920 Excessive and frequent menstruation with regular cycle: Secondary | ICD-10-CM | POA: Diagnosis not present

## 2022-07-16 DIAGNOSIS — Z9981 Dependence on supplemental oxygen: Secondary | ICD-10-CM | POA: Diagnosis not present

## 2022-07-16 DIAGNOSIS — Z8541 Personal history of malignant neoplasm of cervix uteri: Secondary | ICD-10-CM | POA: Diagnosis not present

## 2022-07-16 DIAGNOSIS — Z87891 Personal history of nicotine dependence: Secondary | ICD-10-CM | POA: Diagnosis not present

## 2022-07-16 DIAGNOSIS — Z79899 Other long term (current) drug therapy: Secondary | ICD-10-CM | POA: Diagnosis not present

## 2022-07-16 DIAGNOSIS — E669 Obesity, unspecified: Secondary | ICD-10-CM

## 2022-07-16 DIAGNOSIS — D500 Iron deficiency anemia secondary to blood loss (chronic): Secondary | ICD-10-CM | POA: Insufficient documentation

## 2022-07-16 DIAGNOSIS — D62 Acute posthemorrhagic anemia: Principal | ICD-10-CM

## 2022-07-16 DIAGNOSIS — Z8616 Personal history of COVID-19: Secondary | ICD-10-CM | POA: Insufficient documentation

## 2022-07-16 DIAGNOSIS — R0789 Other chest pain: Secondary | ICD-10-CM | POA: Diagnosis present

## 2022-07-16 DIAGNOSIS — D649 Anemia, unspecified: Secondary | ICD-10-CM

## 2022-07-16 LAB — CBC WITH DIFFERENTIAL/PLATELET
Abs Immature Granulocytes: 0.07 10*3/uL (ref 0.00–0.07)
Basophils Absolute: 0.1 10*3/uL (ref 0.0–0.1)
Basophils Relative: 1 %
Eosinophils Absolute: 0.1 10*3/uL (ref 0.0–0.5)
Eosinophils Relative: 1 %
HCT: 24 % — ABNORMAL LOW (ref 36.0–46.0)
Hemoglobin: 7.1 g/dL — ABNORMAL LOW (ref 12.0–15.0)
Immature Granulocytes: 1 %
Lymphocytes Relative: 22 %
Lymphs Abs: 1.8 10*3/uL (ref 0.7–4.0)
MCH: 22.8 pg — ABNORMAL LOW (ref 26.0–34.0)
MCHC: 29.6 g/dL — ABNORMAL LOW (ref 30.0–36.0)
MCV: 77.2 fL — ABNORMAL LOW (ref 80.0–100.0)
Monocytes Absolute: 0.4 10*3/uL (ref 0.1–1.0)
Monocytes Relative: 4 %
Neutro Abs: 5.8 10*3/uL (ref 1.7–7.7)
Neutrophils Relative %: 71 %
Platelets: 494 10*3/uL — ABNORMAL HIGH (ref 150–400)
RBC: 3.11 MIL/uL — ABNORMAL LOW (ref 3.87–5.11)
RDW: 17.8 % — ABNORMAL HIGH (ref 11.5–15.5)
WBC: 8.1 10*3/uL (ref 4.0–10.5)
nRBC: 0.2 % (ref 0.0–0.2)

## 2022-07-16 LAB — BASIC METABOLIC PANEL
Anion gap: 8 (ref 5–15)
BUN: 8 mg/dL (ref 6–20)
CO2: 25 mmol/L (ref 22–32)
Calcium: 9.1 mg/dL (ref 8.9–10.3)
Chloride: 104 mmol/L (ref 98–111)
Creatinine, Ser: 0.8 mg/dL (ref 0.44–1.00)
GFR, Estimated: >60 mL/min (ref >60)
Glucose, Bld: 111 mg/dL — ABNORMAL HIGH (ref 70–99)
Potassium: 3.4 mmol/L — ABNORMAL LOW (ref 3.5–5.1)
Sodium: 137 mmol/L (ref 135–145)

## 2022-07-16 LAB — IRON AND TIBC
Iron: 32 ug/dL (ref 28–170)
Saturation Ratios: 7 % — ABNORMAL LOW (ref 10.4–31.8)
TIBC: 445 ug/dL (ref 250–450)
UIBC: 413 ug/dL

## 2022-07-16 LAB — FOLATE: Folate: 15.1 ng/mL (ref >5.9)

## 2022-07-16 LAB — TSH: TSH: 167.262 u[IU]/mL — ABNORMAL HIGH (ref 0.350–4.500)

## 2022-07-16 LAB — TROPONIN I (HIGH SENSITIVITY)
Troponin I (High Sensitivity): 5 ng/L (ref <18)
Troponin I (High Sensitivity): 4 ng/L (ref <18)

## 2022-07-16 LAB — FERRITIN: Ferritin: 5 ng/mL — ABNORMAL LOW (ref 11–307)

## 2022-07-16 LAB — PREPARE RBC (CROSSMATCH)

## 2022-07-16 MED ORDER — LEVOTHYROXINE SODIUM 50 MCG PO TABS
125.0000 ug | ORAL_TABLET | Freq: Every day | ORAL | Status: DC
  Administered 2022-07-17: 125 ug via ORAL
  Filled 2022-07-16 (×2): qty 1

## 2022-07-16 MED ORDER — ALBUTEROL SULFATE (2.5 MG/3ML) 0.083% IN NEBU
2.5000 mg | INHALATION_SOLUTION | Freq: Four times a day (QID) | RESPIRATORY_TRACT | Status: DC | PRN
Start: 2022-07-16 — End: 2022-07-18

## 2022-07-16 MED ORDER — BUDESONIDE 0.5 MG/2ML IN SUSP
0.5000 mg | Freq: Two times a day (BID) | RESPIRATORY_TRACT | Status: DC
  Administered 2022-07-17 – 2022-07-18 (×4): 0.5 mg via RESPIRATORY_TRACT
  Filled 2022-07-16 (×4): qty 2

## 2022-07-16 MED ORDER — ACETAMINOPHEN 325 MG PO TABS
650.0000 mg | ORAL_TABLET | Freq: Four times a day (QID) | ORAL | Status: DC | PRN
  Administered 2022-07-16 – 2022-07-17 (×2): 650 mg via ORAL
  Filled 2022-07-16 (×2): qty 2

## 2022-07-16 MED ORDER — ONDANSETRON HCL 4 MG/2ML IJ SOLN: 4.0000 mg | Freq: Four times a day (QID) | INTRAMUSCULAR | Status: DC | PRN

## 2022-07-16 MED ORDER — PROPRANOLOL HCL 10 MG PO TABS: 10.0000 mg | ORAL_TABLET | Freq: Three times a day (TID) | ORAL | Status: DC | PRN

## 2022-07-16 MED ORDER — SODIUM CHLORIDE 0.9 % IV SOLN
10.0000 mL/h | Freq: Once | INTRAVENOUS | Status: AC
  Administered 2022-07-16: 10 mL/h via INTRAVENOUS

## 2022-07-16 MED ORDER — FUROSEMIDE 20 MG PO TABS: 20.0000 mg | ORAL_TABLET | Freq: Every day | ORAL | Status: DC | PRN

## 2022-07-16 MED ORDER — PANTOPRAZOLE SODIUM 40 MG PO TBEC
40.0000 mg | DELAYED_RELEASE_TABLET | Freq: Every day | ORAL | Status: DC
  Administered 2022-07-17 – 2022-07-18 (×2): 40 mg via ORAL
  Filled 2022-07-16 (×2): qty 1

## 2022-07-16 MED ORDER — ONDANSETRON HCL 4 MG PO TABS: 4.0000 mg | ORAL_TABLET | Freq: Four times a day (QID) | ORAL | Status: DC | PRN

## 2022-07-16 MED ORDER — FERROUS SULFATE 325 (65 FE) MG PO TABS
325.0000 mg | ORAL_TABLET | ORAL | Status: DC
  Administered 2022-07-18: 325 mg via ORAL
  Filled 2022-07-16: qty 1

## 2022-07-16 MED ORDER — ACETAMINOPHEN 325 MG RE SUPP: 650.0000 mg | Freq: Four times a day (QID) | RECTAL | Status: DC | PRN

## 2022-07-16 MED ORDER — NYSTATIN 100000 UNIT/GM EX CREA
TOPICAL_CREAM | Freq: Two times a day (BID) | CUTANEOUS | Status: DC
  Filled 2022-07-16: qty 30

## 2022-07-16 NOTE — ED Notes (Signed)
Patient has been taken to imaging.

## 2022-07-16 NOTE — H&P (Incomplete)
History and Physical    Patient: Kathy Vega NAT:[id number] DOB: Aug 06, 1976 DOA: 07/16/2022 DOS: the patient was seen and examined on 07/16/2022 PCP: [facility], [place]  Patient coming from: Home  Chief Complaint: No chief complaint on file.   HPI: Kathy Vega is a 46 y.o. female with medical history significant for Severe covid disease in 2021 requiring trach and prolonged hospitalization, now still with tracheostomy that is capped with no O2 requirement, but complicated with subglottal stenosis with plans for surgery at [facility] in October, in addition to history of thyroid disease, obesity  and anemia related to menorrhagia with hemoglobin as low as 4.8 for which she has needed transfusions in the past, and continues to have ongoing menorrhagia who presents to the ED for evaluation of a sensation of tightness in her neck as well as lightheadedness and palpitations on standing.  She denies shortness of breath, chest pain, dark or bloody stools, nausea or vomiting.  Has been seen in the past by GYN, Dr. Amalia Hailey.  She is currently on oral iron but unable to tolerate ED course and data review: BP 147/84 with otherwise normal vitals.  Labs with hemoglobin 7.1,Down from 10.9 on 04/27/2022.  EKG, personally viewed and interpreted showing NSR at 77 with no acute ST-T wave changes.  Chest x-ray with no acute findings.  Shows stable mild cardiomegaly and multifocal areas of scarring and tracheostomy tube in place.  Soft tissue neck shows tracheostomy in place. Patient was typed and crossed and started on 2 units PRBCs Due to the complaints for neck tightness ENT specialist Dr. Pryor Ochoa was consulted from the ED who came in and did nasolaryngoscopy.With finding of widely patent airway but with signs of reflux.  He recommended PPI for reflux as well as nystatin cream around tracheostomy tube for irritation with follow-up at [facility] laryngology for definitive treatment of subglottic stenosis. Hospitalist  consulted for admission     Past Medical History:  Diagnosis Date   Anemia    Cervical cancer ([place])    Thyroid disease    Past Surgical History:  Procedure Laterality Date   CARDIAC CATHETERIZATION     CERVICAL BIOPSY  W/ LOOP ELECTRODE EXCISION     CESAREAN SECTION     CHOLECYSTECTOMY     LEG SURGERY     TRACHEOSTOMY TUBE PLACEMENT N/A 09/04/2020   Procedure: TRACHEOSTOMY;  Surgeon: Beverly Gust, MD;  Location: [facility] ORS;  Service: ENT;  Laterality: N/A;   Social History:  reports that she quit smoking about 23 years ago. Her smoking use included cigarettes. She has a 2.50 pack-year smoking history. She has never used smokeless tobacco. She reports that she does not drink alcohol and does not use drugs.  No Known Allergies  Family History  Problem Relation Age of Onset   Hyperlipidemia Mother    Hypertension Mother    Heart disease Father    Liver cancer Father    Breast cancer Neg Hx    Ovarian cancer Neg Hx     Prior to Admission medications   Medication Sig Start Date End Date Taking? Authorizing Provider  albuterol (PROVENTIL) (2.5 MG/3ML) 0.083% nebulizer solution Take 3 mLs (2.5 mg total) by nebulization every 6 (six) hours as needed for wheezing or shortness of breath. 12/17/20   Martyn Ehrich, NP  budesonide (PULMICORT) 0.5 MG/2ML nebulizer solution Take 2 mLs (0.5 mg total) by nebulization 2 (two) times daily. 05/12/22 05/12/23  Tyler Pita, MD  ferrous sulfate 325 (  65 FE) MG EC tablet Take 1 tablet (325 mg total) by mouth every other day. With breakfast 07/29/21   Florencia Reasons, MD  furosemide (LASIX) 20 MG tablet Take 1 tablet (20 mg total) by mouth daily as needed for edema (shortness of breath). 04/06/22   Minna Merritts, MD  levothyroxine (SYNTHROID, LEVOTHROID) 125 MCG tablet Take 125 mcg by mouth daily before breakfast.    [provider]  lidocaine (LIDODERM) 5 % Place 1 patch onto the skin every 12 (twelve) hours. Remove & Discard patch  within 12 hours or as directed by MD 10/26/21 10/26/22  Vladimir Crofts, MD  medroxyPROGESTERone (PROVERA) 10 MG tablet Take 1 tablet (10 mg total) by mouth daily. Use for ten days 09/15/21   Harlin Heys, MD  norethindrone (AYGESTIN) 5 MG tablet Take 1 tablet (5 mg total) by mouth 2 (two) times daily for 21 days. Patient not taking: Reported on 03/09/2022 07/29/21 02/09/22  Florencia Reasons, MD  norethindrone (MICRONOR) 0.35 MG tablet Take 1 tablet (0.35 mg total) by mouth daily. 09/15/21   Harlin Heys, MD  potassium chloride 20 MEQ TBCR Take 10 mEq by mouth daily as needed (to take on days you take fluid pill). 04/06/22   Minna Merritts, MD  propranolol (INDERAL) 10 MG tablet Take 1 tablet (10 mg total) by mouth 3 (three) times daily as needed (palpitations). 04/06/22   Minna Merritts, MD    Physical Exam: Vitals:   07/16/22 1616 07/16/22 1617  BP: (!) 147/84   Pulse: 88   Resp: 16   Temp: 98 F (36.7 C)   TempSrc: Oral   SpO2: 100%   Weight:  113.4 kg  Height:  '5\' 9"'$  (1.753 m)   Physical Exam  Labs on Admission: I have personally reviewed following labs and imaging studies  CBC: Recent Labs  Lab 07/16/22 1858  WBC 8.1  NEUTROABS 5.8  HGB 7.1*  HCT 24.0*  MCV 77.2*  PLT 800*   Basic Metabolic Panel: Recent Labs  Lab 07/16/22 1858  NA 137  K 3.4*  CL 104  CO2 25  GLUCOSE 111*  BUN 8  CREATININE 0.80  CALCIUM 9.1   GFR: Estimated Creatinine Clearance: 118 mL/min (by C-G formula based on SCr of 0.8 mg/dL). Liver Function Tests: No results for input(s): "AST", "ALT", "ALKPHOS", "BILITOT", "PROT", "ALBUMIN" in the last 168 hours. No results for input(s): "LIPASE", "AMYLASE" in the last 168 hours. No results for input(s): "AMMONIA" in the last 168 hours. Coagulation Profile: No results for input(s): "INR", "PROTIME" in the last 168 hours. Cardiac Enzymes: No results for input(s): "CKTOTAL", "CKMB", "CKMBINDEX", "TROPONINI" in the last 168 hours. BNP (last 3  results) No results for input(s): "PROBNP" in the last 8760 hours. HbA1C: No results for input(s): "HGBA1C" in the last 72 hours. CBG: No results for input(s): "GLUCAP" in the last 168 hours. Lipid Profile: No results for input(s): "CHOL", "HDL", "LDLCALC", "TRIG", "CHOLHDL", "LDLDIRECT" in the last 72 hours. Thyroid Function Tests: No results for input(s): "TSH", "T4TOTAL", "FREET4", "T3FREE", "THYROIDAB" in the last 72 hours. Anemia Panel: Recent Labs    07/16/22 2103  TIBC 445  IRON 32   Urine analysis:    Component Value Date/Time   COLORURINE STRAW (A) 07/28/2021 1718   APPEARANCEUR HAZY (A) 07/28/2021 1718   LABSPEC 1.013 07/28/2021 1718   PHURINE 6.0 07/28/2021 1718   GLUCOSEU NEGATIVE 07/28/2021 1718   HGBUR MODERATE (A) 07/28/2021 1718   BILIRUBINUR NEGATIVE 07/28/2021  [place] 07/28/2021 [place] 07/28/2021 1718   NITRITE NEGATIVE 07/28/2021 1718   LEUKOCYTESUR NEGATIVE 07/28/2021 1718    Radiological Exams on Admission: DG Chest 2 View  Result Date: 07/16/2022 CLINICAL DATA:  Chest tightness. EXAM: CHEST - 2 VIEW COMPARISON:  04/27/2022 FINDINGS: Tracheostomy tube tip projects over the level of the clavicular heads. Stable lung volumes with multifocal areas of scarring. Stable mild cardiomegaly with unchanged mediastinal contours. No acute airspace disease, pleural effusion, or pneumothorax. No pulmonary edema. No acute osseous abnormalities are seen. IMPRESSION: 1. No acute findings. 2. Stable mild cardiomegaly and multifocal areas of scarring. 3. Tracheostomy tube remains in place. Electronically Signed   By: Keith Rake M.D.   On: 07/16/2022 18:36   DG Neck Soft Tissue  Result Date: 07/16/2022 CLINICAL DATA:  Chest tightness. Patient reports tracheostomy feels tight and pinching her. EXAM: NECK SOFT TISSUES - 1+ VIEW COMPARISON:  Neck CT 12/18/2020 FINDINGS: Tracheostomy tube projects over the tracheal air column with tip at  the level of the clavicular heads. There is no subcutaneous emphysema. No epiglottic enlargement. No prevertebral soft tissue thickening. Soft tissue planes are non suspicious. IMPRESSION: Tracheostomy tube in place, tip projecting over the tracheal air column at the level of the clavicular heads. Electronically Signed   By: Keith Rake M.D.   On: 07/16/2022 18:35     Data Reviewed: Relevant notes from primary care and specialist visits, past discharge summaries as available in EHR, including Care Everywhere. Prior diagnostic testing as pertinent to current admission diagnoses Updated medications and problem lists for reconciliation ED course, including vitals, labs, imaging, treatment and response to treatment Triage notes, nursing and pharmacy notes and ED provider's notes Notable results as noted in HPI   Assessment and Plan: * Acute on chronic blood loss anemia, symptomatic Menorrhagia Symptomatic for palpitations and lightheadedness Continue transfusion of 2 units PRBCs initiated in the ED Consider GYN consult in the a.m. 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 Pryor Ochoa with recommendation for continued follow-up at [facility] Continue PPI and nystatin cream around tracheostomy per recommendation of Dr. Jeannie Fend Vought  Hypothyroidism Continue Synthroid and propranolol  Obesity (BMI 30-39.9) Potential complicating factor to overall prognosis in view of comorbidities  Asthma Not acutely exacerbated.  Continue home Pulmicort nebulizer and albuterol as needed        DVT prophylaxis: SCD  Consults: none  Advance Care Planning:   Code Status: Prior   Family Communication: none  Disposition Plan: Back to previous home environment  Severity of Illness: The appropriate  patient status for this patient is OBSERVATION. Observation status is judged to be reasonable and necessary in order to provide the required intensity of service to ensure the patient's safety. The patient's presenting symptoms, physical exam findings, and initial radiographic and laboratory data in the context of their medical condition is felt to place them at decreased risk for further clinical deterioration. Furthermore, it is anticipated that the patient will be medically stable for discharge from the hospital within 2 midnights of admission.   Author: Athena Masse, MD 07/16/2022 10:03 PM  For on call review [email or web address].

## 2022-07-16 NOTE — Assessment & Plan Note (Signed)
Potential complicating factor to overall prognosis in view of comorbidities

## 2022-07-16 NOTE — Consult Note (Signed)
Kathy Vega, [place] [id number] 08/20/1976 Kathy Polio, MD  Reason for Consult: Shortness of breath  HPI: 46 year old female presents to ER for evaluation of tightness in her neck.  Found to be significantly anemic and being admitted for possible blood transfusion.  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  History of multiple dilations and tracheostomy changes at [facility].  Currently has a Size 4 Shiley in place with PMV of which she tolerates well.  She reports for last week or so she has felt a tightness with her neck and with swallowing.  Scheduled for what sounds like a tracheal resection in near future at [facility].  Last evaluated with CT scan 04/2022.  Allergies: No Known Allergies  ROS: Review of systems normal other than 12 systems except per HPI.  PMH:  Past Medical History:  Diagnosis Date   Anemia    Cervical cancer ([place])    Thyroid disease     FH:  Family History  Problem Relation Age of Onset   Hyperlipidemia Mother    Hypertension Mother    Heart disease Father    Liver cancer Father    Breast cancer Neg Hx    Ovarian cancer Neg Hx     SH:  Social History   Socioeconomic History   Marital status: Married    Spouse name: Not on file   Number of children: Not on file   Years of education: Not on file   Highest education level: Not on file  Occupational History   Not on file  Tobacco Use   Smoking status: Former    Packs/day: 0.50    Years: 5.00    Total pack years: 2.50    Types: Cigarettes    Quit date: 2000    Years since quitting: 23.6   Smokeless tobacco: Never  Vaping Use   Vaping Use: Never used  Substance and Sexual Activity   Alcohol use: No   Drug use: Never   Sexual activity: Yes    Birth control/protection: None  Other Topics Concern   Not on file  Social History Narrative   Not on file   Social Determinants of Health   Financial Resource Strain: Not on file  Food Insecurity: Not on file  Transportation Needs:  Not on file  Physical Activity: Not on file  Stress: Not on file  Social Connections: Not on file  Intimate Partner Violence: Not on file    PSH:  Past Surgical History:  Procedure Laterality Date   CARDIAC CATHETERIZATION     CERVICAL BIOPSY  W/ [place]ha N/A 09/04/2020   Procedure: TRACHEOSTOMY;  Surgeon: Beverly Gust, MD;  Location: [facility] ORS;  Service: ENT;  Laterality: N/A;    Physical  Exam:  GEN-  CN 2-12 grossly intact and symmetric. EARS- external ears clear NOSE- clear anteriorly OC/OP- no masses or lesions NECK- Size 4 shiley in place with PMV.  Minimal drainage.  Mild erythema surrounding.  Good air movement. RESP- unlabored CARD-  RRR  Procedures:  Trans-nasal flexible laryngoscopy and flexible tracheoscopy:  After verbal consent was obtained the flexible laryngoscope was inserted into the patient's right nostril without anesthesia.  The patient's nasal cavity was evaluated along with her nasopharynx, pharynx, and larynx.  This demonstrated normal appearing larynx with mobile vocal folds.  Unable to fully evaluate subglottic area.  Moderate post-cricoid edema and erythema.  No abnormal mass or lesion.  Airway widely patent.  Tracheoscopy revealed widely patent trachea with easily visualized carina.  No tracheal stenosis is seen.  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  Recommend treatment with PPI for reflux as well as Nystatin cream around tracheostomy tube for irritation.  Follow up with [facility] Laryngology for definitive treatment of subglottic stenosis.   [name] 07/16/2022 8:31 PM

## 2022-07-16 NOTE — Assessment & Plan Note (Addendum)
Not acutely exacerbated.  Continue home Pulmicort nebulizer and albuterol as needed sats 99--100%. No respiratory distress

## 2022-07-16 NOTE — Assessment & Plan Note (Signed)
Continue Synthroid and propranolol

## 2022-07-16 NOTE — ED Provider Notes (Signed)
[facility] Provider Note    Event Date/Time   First MD Initiated Contact with Patient 07/16/22 1747     (approximate)   History   No chief complaint on file.   HPI  Kathy Vega is a 46 y.o. female who comes in complaining of some tightness in her upper chest and in her tracheostomy and in her neck.  She says she is also lightheaded when she stands up at times and has palpitations.  The tightness may be worse with exertion.  She is not having any productive cough or fever.  She does not have any pain in her chest.  She reports she supposed to get the trach revised at [facility] in September.  She says her [facility] ENT doctor does not want anybody messing with her trach but Dr. Aundra Dubin here put it in when she had COVID.      Physical Exam   Triage Vital Signs: ED Triage Vitals  Enc Vitals Group     BP 07/16/22 1616 (!) 147/84     Pulse Rate 07/16/22 1616 88     Resp 07/16/22 1616 16     Temp 07/16/22 1616 98 F (36.7 C)     Temp Source 07/16/22 1616 Oral     SpO2 07/16/22 1616 100 %     Weight 07/16/22 1617 250 lb (113.4 kg)     Height 07/16/22 1617 '5\' 9"'$  (1.753 m)     Head Circumference --      Peak Flow --      Pain Score 07/16/22 1617 8     Pain Loc --      Pain Edu? --      Excl. in [place]? --     Most recent vital signs: Vitals:   07/16/22 1616  BP: (!) 147/84  Pulse: 88  Resp: 16  Temp: 98 F (36.7 C)  SpO2: 100%     General: Awake, no distress.  Neck supple nontender.  Trach site does not look inflamed.  There is some soft tissue deep in the trach at the bottom of the tracheal tube that is a little red and fairly adherent to the tracheal tube.  This looks like mucosal type tissue.  There is no erythema on the skin or swelling that looks like an infection or irritation there. CV:  Good peripheral perfusion.  Heart regular rate and rhythm no audible murmurs Resp:  Normal effort.  Lungs are clear Chest is nontender.  Patient reports she felt a  lump in her chest but this on palpation of the area turns out to be the manubrium joint with the lower part of the sternum. Abd:  No distention.  Other:  Extremities no edema  ED Results / Procedures / Treatments   Labs (all labs ordered are listed, but only abnormal results are displayed) Labs Reviewed  BASIC METABOLIC PANEL - Abnormal; Notable for the following components:      Result Value   Potassium 3.4 (*)    Glucose, Bld 111 (*)    All other components within normal limits  CBC WITH DIFFERENTIAL/PLATELET - Abnormal; Notable for the following components:   RBC 3.11 (*)    Hemoglobin 7.1 (*)    HCT 24.0 (*)    MCV 77.2 (*)    MCH 22.8 (*)    MCHC 29.6 (*)    RDW 17.8 (*)    Platelets 494 (*)    All other components within normal limits  IRON  AND TIBC  FERRITIN  TSH  VITAMIN B12  FOLATE  PREPARE RBC (CROSSMATCH)  TYPE AND SCREEN  TROPONIN I (HIGH SENSITIVITY)  TROPONIN I (HIGH SENSITIVITY)     EKG  EKG read interpreted by me shows normal sinus rhythm rate of 77 0 axis nonspecific ST-T wave changes with some flipped T waves in most of the chest leads III and F.  This is similar to EKG from 5 5 of this year.   RADIOLOGY X-ray and x-ray of the soft tissue of the neck read by radiology reviewed and interpreted by me shows only some scarring.  This radiologist feels is unchanged.  I will go with his reading.  PROCEDURES:  Critical Care performed:   Procedures   MEDICATIONS ORDERED IN ED: Medications  0.9 %  sodium chloride infusion (has no administration in time range)     IMPRESSION / MDM / ASSESSMENT AND PLAN / ED COURSE  I reviewed the triage vital signs and the nursing notes. Patient is quite adamant about getting her tight feeling trach checked.  She is not wheezing or anything but I called Dr. Pryor Ochoa who will come in to take a look at her and probably scope her.  We will attempt to get the equipment for him.  After that if her regular blood work comes  back with nothing besides her anemia because of the fact that she is on iron pills and not tolerating them I will try to get her a transfusion overnight.  After that she can go and follow-up with her docs at [facility] as well and also with her cardiologist Dr. Rockey Situ.  Differential diagnosis includes, but is not limited to, symptomatic anemia resulting in cardiac ischemia.  She may have some stenosis in her trach.  She is not wheezing or having any apparent shortness of breath or complaining of any shortness of breath she says her trach is tight.  Is complaining of lightheadedness which would also go along with symptomatic anemia.  She is having heavy menstrual periods which is the probably the cause of her anemia she will need to follow-up with OB/GYN for that.  I am still waiting to see if her troponin is elevated or her electrolytes are out of whack.  We will get iron studies TSH since it has been elevated in the past significantly to 75 and B12 and folate.  Patient's presentation is most consistent with acute presentation with potential threat to life or bodily function.  The patient is on the cardiac monitor to evaluate for evidence of arrhythmia and/or significant heart rate changes.  None have been seen      FINAL CLINICAL IMPRESSION(S) / ED DIAGNOSES   Final diagnoses:  Symptomatic anemia     Rx / DC Orders   ED Discharge Orders     None        Note:  This document was prepared using Dragon voice recognition software and may include unintentional dictation errors.   [name], MD 07/16/22 2041

## 2022-07-16 NOTE — Assessment & Plan Note (Addendum)
Menorrhagia Symptomatic for palpitations and lightheadedness Continue transfusion of 2 units PRBCs initiated in the ED Consider GYN consult in the a.m. for additional recommendations Ferrous sulfate 325 twice daily Age-appropriate colon cancer screening advised

## 2022-07-16 NOTE — Assessment & Plan Note (Signed)
S/p nasolaryngoscopy in the ED by ENT Dr. Pryor Ochoa with recommendation for continued follow-up at [facility] Continue PPI and nystatin cream around tracheostomy per recommendation of Dr. Flonnie Overman

## 2022-07-16 NOTE — ED Triage Notes (Signed)
Patient c/o her trach feels tight and pinching her ongoing for a while. States she has had trach for 3 years. No breathing issues.

## 2022-07-17 DIAGNOSIS — E669 Obesity, unspecified: Secondary | ICD-10-CM

## 2022-07-17 DIAGNOSIS — E038 Other specified hypothyroidism: Secondary | ICD-10-CM

## 2022-07-17 DIAGNOSIS — D649 Anemia, unspecified: Secondary | ICD-10-CM | POA: Diagnosis not present

## 2022-07-17 DIAGNOSIS — Z930 Tracheostomy status: Secondary | ICD-10-CM

## 2022-07-17 DIAGNOSIS — D62 Acute posthemorrhagic anemia: Secondary | ICD-10-CM | POA: Diagnosis not present

## 2022-07-17 LAB — HEMOGLOBIN AND HEMATOCRIT, BLOOD
HCT: 29 % — ABNORMAL LOW (ref 36.0–46.0)
Hemoglobin: 8.9 g/dL — ABNORMAL LOW (ref 12.0–15.0)

## 2022-07-17 LAB — CBC
HCT: 24.4 % — ABNORMAL LOW (ref 36.0–46.0)
Hemoglobin: 7.4 g/dL — ABNORMAL LOW (ref 12.0–15.0)
MCH: 23.6 pg — ABNORMAL LOW (ref 26.0–34.0)
MCHC: 30.3 g/dL (ref 30.0–36.0)
MCV: 78 fL — ABNORMAL LOW (ref 80.0–100.0)
Platelets: 447 10*3/uL — ABNORMAL HIGH (ref 150–400)
RBC: 3.13 MIL/uL — ABNORMAL LOW (ref 3.87–5.11)
RDW: 17.8 % — ABNORMAL HIGH (ref 11.5–15.5)
WBC: 7.9 10*3/uL (ref 4.0–10.5)
nRBC: 0.4 % — ABNORMAL HIGH (ref 0.0–0.2)

## 2022-07-17 LAB — VITAMIN B12: Vitamin B-12: 230 pg/mL (ref 180–914)

## 2022-07-17 LAB — PREPARE RBC (CROSSMATCH)

## 2022-07-17 MED ORDER — SODIUM CHLORIDE 0.9 % IV SOLN
300.0000 mg | Freq: Once | INTRAVENOUS | Status: AC
  Administered 2022-07-17: 300 mg via INTRAVENOUS
  Filled 2022-07-17 (×2): qty 15

## 2022-07-17 MED ORDER — ASCORBIC ACID 500 MG PO TABS
500.0000 mg | ORAL_TABLET | Freq: Every day | ORAL | Status: DC
Start: 2022-07-17 — End: 2022-07-18
  Administered 2022-07-17 – 2022-07-18 (×2): 500 mg via ORAL
  Filled 2022-07-17 (×2): qty 1

## 2022-07-17 MED ORDER — LEVOTHYROXINE SODIUM 50 MCG PO TABS
225.0000 ug | ORAL_TABLET | Freq: Every day | ORAL | Status: DC
  Administered 2022-07-18: 225 ug via ORAL
  Filled 2022-07-17: qty 1

## 2022-07-17 MED ORDER — MELATONIN 5 MG PO TABS
5.0000 mg | ORAL_TABLET | Freq: Once | ORAL | Status: AC
  Administered 2022-07-18: 5 mg via ORAL
  Filled 2022-07-17: qty 1

## 2022-07-17 MED ORDER — SODIUM CHLORIDE 0.9% IV SOLUTION: Freq: Once | INTRAVENOUS | Status: AC

## 2022-07-17 MED ORDER — LEVOTHYROXINE SODIUM 100 MCG PO TABS
100.0000 ug | ORAL_TABLET | Freq: Once | ORAL | Status: AC
Start: 2022-07-17 — End: 2022-07-17
  Administered 2022-07-17: 100 ug via ORAL
  Filled 2022-07-17: qty 1

## 2022-07-17 MED ORDER — SODIUM CHLORIDE 0.9 % IV SOLN: 500.0000 mg | Freq: Once | INTRAVENOUS | Status: DC

## 2022-07-17 NOTE — Progress Notes (Signed)
PROGRESS NOTE    Kathy Vega  BPZ:[id number] DOB: [birth year] DOA: 07/16/2022 PCP: [facility] [place]     Brief Narrative:  46 y.o. BF PMHx  Severe covid disease in 2021 requiring trach and prolonged hospitalization, now still with tracheostomy on nighttime and as needed O2 up to 4 L, but complicated with subglottal stenosis with plans for surgery at [facility] in October, in addition to history of thyroid disease, obesity  and anemia related to menorrhagia with hemoglobin as low as 4.8 for which she has needed transfusions in the past, and continues to have ongoing menorrhagia   Presents to the ED for evaluation of a sensation of tightness in her neck as well as lightheadedness on standing and palpitations and shortness of breath with exertion.  She denies chest pain, dark or bloody stools, nausea or vomiting.  Has been seen in the past by GYN, Dr. Amalia Hailey.  She is on the blood to tolerate oral iron.  Her last period ended 2 weeks prior and lasted 5 days but had a lot of clots.  She denies rectal bleeding. ED course and data review: BP 147/84 with otherwise normal vitals.  Labs with hemoglobin 7.1,Down from 10.9 on 04/27/2022.  EKG, personally viewed and interpreted showing NSR at 77 with no acute ST-T wave changes.  Chest x-ray with no acute findings.  Shows stable mild cardiomegaly and multifocal areas of scarring and tracheostomy tube in place.  Soft tissue neck shows tracheostomy in place. Patient was typed and crossed and started on 2 units PRBCs Due to the complaints for neck tightness ENT specialist Dr. Pryor Ochoa was consulted from the ED who came in and did nasolaryngoscopy.With finding of widely patent airway but with signs of reflux.  He recommended PPI for reflux as well as nystatin cream around tracheostomy tube for irritation with follow-up at [facility] laryngology for definitive treatment of subglottic stenosis. Hospitalist consulted for admission    Subjective: A/O x4, complains  of fatigue.  Left lower leg pain.   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  Patient has known cervical cancer s/p colposcopy.  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 Pryor Ochoa with recommendation for continued follow-up at [place] PPI and nystatin cream around tracheostomy per recommendation of Dr. Jeannie Fend Vought -8/20 per patient scheduled for repair of her trachea in October.  Asthma -Not acutely exacerbated.  Continue home Pulmicort nebulizer and albuterol as needed   Hypothyroidism -8/19 TSH= 167.2 -Unsure if patient was compliant with her Synthroid for severely depressed.  Patient appears to be compliant with other medications. - 8/20 increase Synthroid 225 mcg daily - 8/20 Synthroid 100 mcg x 1 - We will need to monitor closely all electrolytes  Essential HTN - Propranolol 10 mg TID PRN    Obesity (BMI 30-39.9) -Potential complicating factor to overall prognosis  in view of comorbidities  LLE pain - Mildly painful to touch, negative erythema, negative rubor not convinced a cellulitis. - We will obtain lactic acid. -We will hold on starting antibiotics at this time.    Pressure Injury 09/01/20 Buttocks Right;Left Deep Tissue Pressure Injury - Purple or maroon localized  area of discolored intact skin or blood-filled blister due to damage of underlying soft tissue from pressure and/or shear. Lower part of the bottocks. I (Active)  09/01/20 0800  Location: Buttocks  Location Orientation: Right;Left  Staging: Deep Tissue Pressure Injury - Purple or maroon localized area of discolored intact skin or blood-filled blister due to damage of underlying soft tissue from pressure and/or shear.  Wound Description (Comments): Lower part of the bottocks. It seems like from medical device. Maroon/purple streaks  Present on Admission:         Mobility Assessment (last 72 hours)     Mobility Assessment   No documentation.                 DVT prophylaxis: SCD Code Status: Full Family Communication:  Status is: Inpatient    Dispo: The patient is from: Home              Anticipated d/c is to: Home              Anticipated d/c date is: 3 days              Patient currently is not medically stable to d/c.      Consultants:    Procedures/Significant Events:    I have personally reviewed and interpreted all radiology studies and my findings are as above.  VENTILATOR SETTINGS:    Cultures   Antimicrobials:    Devices    LINES / TUBES:      Continuous Infusions:   Objective: Vitals:   07/17/22 0330 07/17/22 0400 07/17/22 0430 07/17/22 0742  BP: (!) 99/58 109/73 117/75 117/69  Pulse: 79 69 69 79  Resp: '13 14 15 18  '$ Temp:    (!) 97.4 F (36.3 C)  TempSrc:    Oral  SpO2: 97% 99% 100% 99%  Weight:      Height:        Intake/Output Summary (Last 24 hours) at 07/17/2022 5329 Last data filed at 07/17/2022 0100 Gross per 24 hour  Intake 330 ml  Output --  Net 330 ml   Filed Weights   07/16/22 1617  Weight: 113.4 kg    Examination:  General: A/O x4 ,No acute respiratory distress Eyes: negative scleral hemorrhage, negative anisocoria, negative icterus ENT: Negative Runny nose, negative gingival bleeding, Neck:   Negative scars, masses, torticollis, lymphadenopathy, JVD, trach collar in place negative sign of infection.  Patient able to communicate well with PMV in place Lungs: Clear to auscultation bilaterally without wheezes or crackles Cardiovascular: Regular rate and rhythm without murmur gallop or rub normal S1 and S2 Abdomen: OBESE, negative abdominal pain, nondistended, positive soft, bowel sounds, no rebound, no ascites, no appreciable mass Extremities: No significant cyanosis, clubbing, or edema bilateral lower extremities Skin: Negative rashes, lesions, ulcers Psychiatric:  Negative depression, negative anxiety, negative fatigue, negative mania  Central nervous system:  Cranial nerves II through XII intact, tongue/uvula midline, all extremities muscle strength 5/5, sensation intact throughout, negative dysarthria, negative expressive aphasia, negative receptive aphasia.  .     Data Reviewed: Care during the described time interval was provided by me .  I have reviewed this patient's available data,  including medical history, events of note, physical examination, and all test results as part of my evaluation.  CBC: Recent Labs  Lab 07/16/22 1858 07/17/22 0208  WBC 8.1 7.9  NEUTROABS 5.8  --   HGB 7.1* 7.4*  HCT 24.0* 24.4*  MCV 77.2* 78.0*  PLT 494* 737*   Basic Metabolic Panel: Recent Labs  Lab 07/16/22 1858  NA 137  K 3.4*  CL 104  CO2 25  GLUCOSE 111*  BUN 8  CREATININE 0.80  CALCIUM 9.1   GFR: Estimated Creatinine Clearance: 118 mL/min (by C-G formula based on SCr of 0.8 mg/dL). Liver Function Tests: No results for input(s): "AST", "ALT", "ALKPHOS", "BILITOT", "PROT", "ALBUMIN" in the last 168 hours. No results for input(s): "LIPASE", "AMYLASE" in the last 168 hours. No results for input(s): "AMMONIA" in the last 168 hours. Coagulation Profile: No results for input(s): "INR", "PROTIME" in the last 168 hours. Cardiac Enzymes: No results for input(s): "CKTOTAL",  "CKMB", "CKMBINDEX", "TROPONINI" in the last 168 hours. BNP (last 3 results) No results for input(s): "PROBNP" in the last 8760 hours. HbA1C: No results for input(s): "HGBA1C" in the last 72 hours. CBG: No results for input(s): "GLUCAP" in the last 168 hours. Lipid Profile: No results for input(s): "CHOL", "HDL", "LDLCALC", "TRIG", "CHOLHDL", "LDLDIRECT" in the last 72 hours. Thyroid Function Tests: Recent Labs    07/16/22 2103  TSH 167.262*   Anemia Panel: Recent Labs    07/16/22 2103  VITAMINB12 230  FOLATE 15.1  FERRITIN 5*  TIBC 445  IRON 32   Sepsis Labs: No results for input(s): "PROCALCITON", "LATICACIDVEN" in the last 168 hours.  No results found for this or any previous visit (from the past 240 hour(s)).       Radiology Studies: DG Chest 2 View  Result Date: 07/16/2022 CLINICAL DATA:  Chest tightness. EXAM: CHEST - 2 VIEW COMPARISON:  04/27/2022 FINDINGS: Tracheostomy tube tip projects over the level of the clavicular heads. Stable lung volumes with multifocal areas of scarring. Stable mild cardiomegaly with unchanged mediastinal contours. No acute airspace disease, pleural effusion, or pneumothorax. No pulmonary edema. No acute osseous abnormalities are seen. IMPRESSION: 1. No acute findings. 2. Stable mild cardiomegaly and multifocal areas of scarring. 3. Tracheostomy tube remains in place. Electronically Signed   By: Keith Rake M.D.   On: 07/16/2022 18:36   DG Neck Soft Tissue  Result Date: 07/16/2022 CLINICAL DATA:  Chest tightness. Patient reports tracheostomy feels tight and pinching her. EXAM: NECK SOFT TISSUES - 1+ VIEW COMPARISON:  Neck CT 12/18/2020 FINDINGS: Tracheostomy tube projects over the tracheal air column with tip at the level of the clavicular heads. There is no subcutaneous emphysema. No epiglottic enlargement. No prevertebral soft tissue thickening. Soft tissue planes are non suspicious. IMPRESSION: Tracheostomy tube in place, tip  projecting over the tracheal air column at the level of the clavicular heads. Electronically Signed   By: Keith Rake M.D.   On: 07/16/2022 18:35        Scheduled Meds:  budesonide  0.5 mg Nebulization BID   [START ON 07/18/2022] ferrous sulfate  325 mg Oral Q48H   levothyroxine  125 mcg Oral Q0600   nystatin cream   Topical BID   pantoprazole  40 mg Oral Daily   Continuous Infusions:   LOS: 0 days    Time spent:40 min    Khamron Gellert, Geraldo Docker, MD [facility]   If 7PM-7AM, please contact night-coverage 07/17/2022, 8:26 AM

## 2022-07-17 NOTE — Progress Notes (Signed)
       CROSS COVER NOTE  NAME: Kathy Vega MRN: [id number] DOB : November 11, 1976    Date of Service   07/17/22  HPI/Events of Note   Received medication request for sleep aid.  Interventions   Plan: Melatonin x1     This document was prepared using Dragon voice recognition software and may include unintentional dictation errors.  Neomia Glass DNP, MHA, FNP-BC Nurse Practitioner [facility] Pager [phone]

## 2022-07-17 NOTE — ED Notes (Signed)
Advised nurse that patient has ready bed 

## 2022-07-18 DIAGNOSIS — E669 Obesity, unspecified: Secondary | ICD-10-CM | POA: Diagnosis not present

## 2022-07-18 DIAGNOSIS — E038 Other specified hypothyroidism: Secondary | ICD-10-CM | POA: Diagnosis not present

## 2022-07-18 DIAGNOSIS — D62 Acute posthemorrhagic anemia: Secondary | ICD-10-CM | POA: Diagnosis not present

## 2022-07-18 DIAGNOSIS — Z930 Tracheostomy status: Secondary | ICD-10-CM | POA: Diagnosis not present

## 2022-07-18 LAB — TYPE AND SCREEN
ABO/RH(D): O POS
Antibody Screen: NEGATIVE
Unit division: 0
Unit division: 0

## 2022-07-18 LAB — COMPREHENSIVE METABOLIC PANEL
ALT: 12 U/L (ref 0–44)
AST: 19 U/L (ref 15–41)
Albumin: 3.6 g/dL (ref 3.5–5.0)
Alkaline Phosphatase: 54 U/L (ref 38–126)
Anion gap: 6 (ref 5–15)
BUN: 8 mg/dL (ref 6–20)
CO2: 26 mmol/L (ref 22–32)
Calcium: 9.2 mg/dL (ref 8.9–10.3)
Chloride: 107 mmol/L (ref 98–111)
Creatinine, Ser: 0.79 mg/dL (ref 0.44–1.00)
GFR, Estimated: >60 mL/min (ref >60)
Glucose, Bld: 114 mg/dL — ABNORMAL HIGH (ref 70–99)
Potassium: 4 mmol/L (ref 3.5–5.1)
Sodium: 139 mmol/L (ref 135–145)
Total Bilirubin: 0.8 mg/dL (ref 0.3–1.2)
Total Protein: 6.7 g/dL (ref 6.5–8.1)

## 2022-07-18 LAB — CBC WITH DIFFERENTIAL/PLATELET
Abs Immature Granulocytes: 0.05 K/uL (ref 0.00–0.07)
Basophils Absolute: 0.1 K/uL (ref 0.0–0.1)
Basophils Relative: 1 %
Eosinophils Absolute: 0.1 K/uL (ref 0.0–0.5)
Eosinophils Relative: 2 %
HCT: 28.4 % — ABNORMAL LOW (ref 36.0–46.0)
Hemoglobin: 8.8 g/dL — ABNORMAL LOW (ref 12.0–15.0)
Immature Granulocytes: 1 %
Lymphocytes Relative: 20 %
Lymphs Abs: 1.5 K/uL (ref 0.7–4.0)
MCH: 24.4 pg — ABNORMAL LOW (ref 26.0–34.0)
MCHC: 31 g/dL (ref 30.0–36.0)
MCV: 78.9 fL — ABNORMAL LOW (ref 80.0–100.0)
Monocytes Absolute: 0.5 K/uL (ref 0.1–1.0)
Monocytes Relative: 6 %
Neutro Abs: 5.4 K/uL (ref 1.7–7.7)
Neutrophils Relative %: 70 %
Platelets: 403 K/uL — ABNORMAL HIGH (ref 150–400)
RBC: 3.6 MIL/uL — ABNORMAL LOW (ref 3.87–5.11)
RDW: 17.2 % — ABNORMAL HIGH (ref 11.5–15.5)
WBC: 7.6 K/uL (ref 4.0–10.5)
nRBC: 0.5 % — ABNORMAL HIGH (ref 0.0–0.2)

## 2022-07-18 LAB — PHOSPHORUS: Phosphorus: 3.9 mg/dL (ref 2.5–4.6)

## 2022-07-18 LAB — BPAM RBC
Blood Product Expiration Date: 202309022359
Blood Product Expiration Date: 202309212359
ISSUE DATE / TIME: 202308192248
ISSUE DATE / TIME: 202308201514
Unit Type and Rh: 5100
Unit Type and Rh: 5100

## 2022-07-18 LAB — MAGNESIUM: Magnesium: 2.2 mg/dL (ref 1.7–2.4)

## 2022-07-18 LAB — HIV ANTIBODY (ROUTINE TESTING W REFLEX): HIV Screen 4th Generation wRfx: NONREACTIVE

## 2022-07-18 LAB — LACTIC ACID, PLASMA: Lactic Acid, Venous: 1.9 mmol/L (ref 0.5–1.9)

## 2022-07-18 MED ORDER — PANTOPRAZOLE SODIUM 40 MG PO TBEC: 40.0000 mg | DELAYED_RELEASE_TABLET | Freq: Every day | ORAL | 2 refills | Status: DC

## 2022-07-18 MED ORDER — LEVOTHYROXINE SODIUM 75 MCG PO TABS: 225.0000 ug | ORAL_TABLET | Freq: Every day | ORAL | 2 refills | Status: DC

## 2022-07-18 MED ORDER — ASCORBIC ACID 500 MG PO TABS: 500.0000 mg | ORAL_TABLET | Freq: Every day | ORAL | 0 refills | Status: DC

## 2022-07-18 MED ORDER — NYSTATIN 100000 UNIT/GM EX CREA: TOPICAL_CREAM | Freq: Two times a day (BID) | CUTANEOUS | 0 refills | Status: DC

## 2022-07-18 MED ORDER — SODIUM CHLORIDE 0.9 % IV SOLN
300.0000 mg | Freq: Once | INTRAVENOUS | Status: AC
  Administered 2022-07-18: 300 mg via INTRAVENOUS
  Filled 2022-07-18: qty 15
  Filled 2022-07-18: qty 300

## 2022-07-18 MED ORDER — POLYSACCHARIDE IRON COMPLEX 150 MG PO CAPS
150.0000 mg | ORAL_CAPSULE | Freq: Every day | ORAL | Status: DC
  Administered 2022-07-18: 150 mg via ORAL
  Filled 2022-07-18: qty 1

## 2022-07-18 MED ORDER — POLYSACCHARIDE IRON COMPLEX 150 MG PO CAPS: 150.0000 mg | ORAL_CAPSULE | Freq: Every day | ORAL | 1 refills | Status: DC

## 2022-07-18 NOTE — Discharge Summary (Signed)
Physician Discharge Summary   Patient: Kathy Vega MRN: [id number] DOB: Apr 30, 1976  Admit date:     07/16/2022  Discharge date: 07/18/22  Discharge Physician: Fritzi Mandes   PCP: [facility] [place]   Recommendations at discharge:    Pt advised to follow-up with OB/GYN Dr. Amalia Hailey as outpatient for menorrhagia. Patient will follow-up with cancer center on the appointment that will be called her for iron deficiency anemia. Follow-up [facility] ENT on your scheduled appointment. 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 female with medical history significant for Severe covid disease in 2021 requiring trach and prolonged hospitalization, now still with tracheostomy on nighttime and as needed O2 up to 4 L, but complicated with subglottal stenosis with plans for surgery at [facility] in October, in addition to history of thyroid disease, obesity  and anemia related to menorrhagia with hemoglobin as low as 4.8 for which she has needed transfusions in the past, and continues to have ongoing menorrhagia who presents to the ED for evaluation of a sensation of tightness in her neck as well as lightheadedness on standing and palpitations and shortness of breath with exertion.  Has been seen in the past by GYN, Dr. Amalia Hailey.  She is on the blood to tolerate oral iron.  Her last period ended 2 weeks prior and lasted 5 days but had a lot of clots.  She denies rectal bleeding.  * Acute on chronic blood loss anemia, symptomatic Menorrhagia --Symptomatic for palpitations and lightheadedness -- status post two unit blood transfusion  -- no active vaginally bleeding or rectal bleeding  -- patient received IV iron. Started on oral once daily dosing iron pills  -- patient advised to get appointment with  Dr. Amalia Hailey as outpatient. She also has stopped taking the meds that were prescribed for menorrhagia October 2022. -- Patient voice understanding importance of follow-up with GYN -- discussed with Dr. Rogue Bussing at the cancer center. Patient will follow-up as outpatient for anemia.  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 Pryor Ochoa with recommendation for continued follow-up at [facility] Continue PPI and nystatin cream around tracheostomy per recommendation of Dr. Jeannie Fend Vought Continue nighttime and as needed O2 up to 4 L  Hypothyroidism -- TSH elevated. Dose of Synthroid adjusted. Patient recommended to get TSH evaluated as outpatient by PCP and next few weeks.  Obesity (BMI 30-39.9) Potential complicating factor to overall prognosis in view of comorbidities  Asthma Not acutely exacerbated.  Continue home Pulmicort nebulizer and albuterol as needed sats 99--100%. No respiratory distress   Of your overall hemodynamically stable. Appears at baseline. Discharge instructions discussed with patient. She voiced understanding.       Consultants: ENT Procedures performed: flexible laryngoscopy Disposition: Home Diet recommendation:  Discharge Diet Orders (From admission, onward)     Start     Ordered   07/18/22 0000  Diet - low sodium heart healthy        07/18/22 1121           Regular diet DISCHARGE MEDICATION: Allergies as of 07/18/2022   No Known Allergies      Medication List     STOP taking these medications    ferrous sulfate 325 (65 FE)  MG EC tablet   lidocaine 5 % Commonly known as: Lidoderm   medroxyPROGESTERone 10 MG tablet Commonly known as: PROVERA   norethindrone 0.35 MG tablet Commonly known as: MICRONOR   norethindrone 5 MG tablet Commonly known as: AYGESTIN   Potassium Chloride ER 20 MEQ Tbcr       TAKE these  medications    acetaminophen 500 MG tablet Commonly known as: TYLENOL Take 500 mg by mouth every 6 (six) hours as needed for headache, fever, moderate pain or mild pain.   albuterol (2.5 MG/3ML) 0.083% nebulizer solution Commonly known as: PROVENTIL Take 3 mLs (2.5 mg total) by nebulization every 6 (six) hours as needed for wheezing or shortness of breath.   ascorbic acid 500 MG tablet Commonly known as: VITAMIN C Take 1 tablet (500 mg total) by mouth daily. Start taking on: July 19, 2022   budesonide 0.5 MG/2ML nebulizer solution Commonly known as: PULMICORT Take 2 mLs (0.5 mg total) by nebulization 2 (two) times daily.   furosemide 20 MG tablet Commonly known as: LASIX Take 1 tablet (20 mg total) by mouth daily as needed for edema (shortness of breath).   hydrOXYzine 10 MG tablet Commonly known as: ATARAX Take 10 mg by mouth 3 (three) times daily.   iron polysaccharides 150 MG capsule Commonly known as: NIFEREX Take 1 capsule (150 mg total) by mouth daily.   levothyroxine 75 MCG tablet Commonly known as: SYNTHROID Take 3 tablets (225 mcg total) by mouth daily at 6 (six) AM. Start taking on: July 19, 2022 What changed:  medication strength how much to take when to take this Another medication with the same name was removed. Continue taking this medication, and follow the directions you see here.   nystatin cream Commonly known as: MYCOSTATIN Apply topically 2 (two) times daily.   pantoprazole 40 MG tablet Commonly known as: PROTONIX Take 1 tablet (40 mg total) by mouth daily. Start taking on: July 19, 2022   propranolol 10 MG tablet Commonly known as: INDERAL Take 1 tablet (10 mg total) by mouth 3 (three) times daily as needed (palpitations).        Follow-up [place]. Schedule an appointment as soon as possible for a visit in 1 week(s).   Specialty: General Practice Why: Hospital follow-up Contact  information: [place] [address] [phone]         Cammie Sickle, MD. Go to.   Specialties: Internal Medicine, Oncology Why: For iron deficiency anemia. Patient advised to follow-up on appointment that will be called and by cancer center Contact information: [place] [address] [phone]                Discharge Exam: Danley Danker Weights   07/16/22 1617  Weight: 113.4 kg     Condition at discharge: fair  The results of significant diagnostics from this hospitalization (including imaging, microbiology, ancillary and laboratory) are listed below for reference.   Imaging Studies: DG Chest 2 View  Result Date: 07/16/2022 CLINICAL DATA:  Chest tightness. EXAM: CHEST - 2 VIEW COMPARISON:  04/27/2022 FINDINGS: Tracheostomy tube tip projects over the level of the clavicular heads. Stable lung volumes with multifocal areas of scarring. Stable mild cardiomegaly with unchanged mediastinal contours. No acute airspace disease, pleural effusion, or pneumothorax. No pulmonary edema. No acute osseous abnormalities are seen. IMPRESSION: 1. No acute findings. 2. Stable mild cardiomegaly and multifocal areas of scarring.  3. Tracheostomy tube remains in place. Electronically Signed   By: Keith Rake M.D.   On: 07/16/2022 18:36   DG Neck Soft Tissue  Result Date: 07/16/2022 CLINICAL DATA:  Chest tightness. Patient reports tracheostomy feels tight and pinching her. EXAM: NECK SOFT TISSUES - 1+ VIEW COMPARISON:  Neck CT 12/18/2020 FINDINGS: Tracheostomy tube projects over the tracheal air column with tip at the level of the clavicular heads. There is no subcutaneous emphysema. No epiglottic enlargement. No prevertebral soft tissue thickening. Soft tissue planes are non suspicious. IMPRESSION: Tracheostomy tube in place, tip projecting over the tracheal air column at the level of the clavicular heads. Electronically Signed   By:  Keith Rake M.D.   On: 07/16/2022 18:35    Microbiology: Results for orders placed or performed during the hospital encounter of 04/27/22  Resp Panel by RT-PCR (Flu A&B, Covid) Anterior Nasal Swab     Status: None   Collection Time: 04/27/22  9:50 AM   Specimen: Anterior Nasal Swab  Result Value Ref Range Status   SARS Coronavirus 2 by RT PCR NEGATIVE NEGATIVE Final    Comment: (NOTE) SARS-CoV-2 target nucleic acids are NOT DETECTED.  The SARS-CoV-2 RNA is generally detectable in upper respiratory specimens during the acute phase of infection. The lowest concentration of SARS-CoV-2 viral copies this assay can detect is 138 copies/mL. A negative result does not preclude SARS-Cov-2 infection and should not be used as the sole basis for treatment or other patient management decisions. A negative result may occur with  improper specimen collection/handling, submission of specimen other than nasopharyngeal swab, presence of viral mutation(s) within the areas targeted by this assay, and inadequate number of viral copies(<138 copies/mL). A negative result must be combined with clinical observations, patient history, and epidemiological information. The expected result is Negative.  Fact Sheet for Patients:  EntrepreneurPulse.com.au  Fact Sheet for Healthcare Providers:  IncredibleEmployment.be  This test is no t yet approved or cleared by the [place] FDA and  has been authorized for detection and/or diagnosis of SARS-CoV-2 by FDA under an Emergency Use Authorization (EUA). This EUA will remain  in effect (meaning this test can be used) for the duration of the COVID-19 declaration under Section 564(b)(1) of the Act, 21 U.S.C.section 360bbb-3(b)(1), unless the authorization is terminated  or revoked sooner.       Influenza A by PCR NEGATIVE NEGATIVE Final   Influenza B by PCR NEGATIVE NEGATIVE Final    Comment: (NOTE) The Xpert Xpress  SARS-CoV-2/FLU/RSV plus assay is intended as an aid in the diagnosis of influenza from Nasopharyngeal swab specimens and should not be used as a sole basis for treatment. Nasal washings and aspirates are unacceptable for Xpert Xpress SARS-CoV-2/FLU/RSV testing.  Fact Sheet for Patients: EntrepreneurPulse.com.au  Fact Sheet for Healthcare Providers: IncredibleEmployment.be  This test is not yet approved or cleared by the [place] FDA and has been authorized for detection and/or diagnosis of SARS-CoV-2 by FDA under an Emergency Use Authorization (EUA). This EUA will remain in effect (meaning this test can be used) for the duration of the COVID-19 declaration under Section 564(b)(1) of the Act, 21 U.S.C. section 360bbb-3(b)(1), unless the authorization is terminated or revoked.  Performed at [facility], [address] [place], [place] [address]     Labs: CBC: Recent Labs  Lab 07/16/22 1858 07/17/22 0208 07/17/22 2054 07/18/22 0429  WBC 8.1 7.9  --  7.6  NEUTROABS 5.8  --   --  5.4  HGB  7.1* 7.4* 8.9* 8.8*  HCT 24.0* 24.4* 29.0* 28.4*  MCV 77.2* 78.0*  --  78.9*  PLT 494* 447*  --  361*   Basic Metabolic Panel: Recent Labs  Lab 07/16/22 1858 07/18/22 0429  NA 137 139  K 3.4* 4.0  CL 104 107  CO2 25 26  GLUCOSE 111* 114*  BUN 8 8  CREATININE 0.80 0.79  CALCIUM 9.1 9.2  MG  --  2.2  PHOS  --  3.9   Liver Function Tests: Recent Labs  Lab 07/18/22 0429  AST 19  ALT 12  ALKPHOS 54  BILITOT 0.8  PROT 6.7  ALBUMIN 3.6    Discharge time spent: greater than 30 minutes.  Signed: Fritzi Mandes, MD [facility] 07/18/2022

## 2022-07-18 NOTE — TOC Initial Note (Signed)
Transition of Care [facility]) - Initial/Assessment Note    Patient Details  Name: Kathy Vega MRN: [id number] Date of Birth: [birth year]  [facility] of [facility]) CM/SW Contact:    Beverly Sessions, RN Phone Number: 07/18/2022, 11:58 AM  Clinical Narrative:                    Patient to discharge today Patient states her husband will be transporting at discharge Patient confirms she has all trach supplies and O2 at home through [employer]  Patient request a portable O2 tank for transport at discharge [place] with [employer] notified      Patient Goals and CMS Choice        Expected Discharge Plan and Services           Expected Discharge Date: 07/18/22                                   Prior Living Arrangements/Services                       Activities of Daily Living Home Assistive Devices/Equipment: Vent/Trach supplies, Oxygen, Walker (specify type), Built-in shower seat, Bedside commode/3-in-1 ADL Screening (condition at time of admission) Patient's cognitive ability adequate to safely complete daily activities?: Yes Is the patient deaf or have difficulty hearing?: No Does the patient have difficulty seeing, even when wearing glasses/contacts?: No Does the patient have difficulty concentrating, remembering, or making decisions?: Yes Patient able to express need for assistance with ADLs?: Yes Does the patient have difficulty dressing or bathing?: Yes Independently performs ADLs?: Yes (appropriate for developmental age) Does the patient have difficulty walking or climbing stairs?: No Weakness of Legs: None Weakness of Arms/Hands: None  Permission Sought/Granted                  Emotional Assessment              Admission diagnosis:  Symptomatic anemia [D64.9] Patient Active Problem List   Diagnosis Date Noted   Iron deficiency anemia due to chronic blood loss 07/16/2022   Obesity (BMI 30-39.9) 07/16/2022   Hypothyroidism    Acute on chronic  blood loss anemia, symptomatic 07/28/2021   Asthma 03/23/2021   Tracheal stenosis 03/23/2021   Intubation of airway performed without difficulty    Pain    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(HCC) 09/18/2020   Acute respiratory failure ([place])    Pneumonia due to COVID-19 virus 08/13/2020   Acute renal failure (ARF) ([place]) 08/13/2020   Elevated liver enzymes 08/13/2020   CAP (community acquired pneumonia) 08/13/2020   Acute encephalopathy 08/13/2020   Hypokalemia 08/13/2020   PCP:  [facility], [place]:   [facility] [place], [place] [address] [place] [facility] [address] [facility] [place] [address] Phone: [phone] Fax: [phone]     Social Determinants of Health (SDOH) Interventions    Readmission Risk Interventions    09/10/2020   11:48 AM  Readmission Risk Prevention Plan  Transportation Screening Complete  PCP or Specialist Appt within 3-5 Days Complete  HRI or Home Care Consult Complete  Social Work Consult for [place] Planning/Counseling Complete  Palliative Care Screening Complete  Medication Review Press photographer) Complete

## 2022-07-18 NOTE — Discharge Instructions (Addendum)
Pt advised to follow-up with OB/GYN Dr. Amalia Hailey as outpatient for menorrhagia. Patient will follow-up with cancer center on the appointment that will be called her for iron deficiency anemia. Follow-up [facility] ENT on your scheduled appointment. Tracheostomy care as before

## 2022-07-18 NOTE — Plan of Care (Signed)
Patient ID: Kathy Vega, female   DOB: 07/30/1976, 46 y.o.   MRN: [id number]  Problem: Education: Goal: Knowledge of General Education information will improve Description: Including pain rating scale, medication(s)/side effects and non-pharmacologic comfort measures Outcome: Adequate for Discharge   Problem: Health Behavior/Discharge Planning: Goal: Ability to manage health-related needs will improve Outcome: Adequate for Discharge   Problem: Clinical Measurements: Goal: Ability to maintain clinical measurements within normal limits will improve Outcome: Adequate for Discharge Goal: Will remain free from infection Outcome: Adequate for Discharge Goal: Diagnostic test results will improve Outcome: Adequate for Discharge Goal: Respiratory complications will improve Outcome: Adequate for Discharge Goal: Cardiovascular complication will be avoided Outcome: Adequate for Discharge   Problem: Activity: Goal: Risk for activity intolerance will decrease Outcome: Adequate for Discharge   Problem: Nutrition: Goal: Adequate nutrition will be maintained Outcome: Adequate for Discharge   Problem: Coping: Goal: Level of anxiety will decrease Outcome: Adequate for Discharge   Problem: Elimination: Goal: Will not experience complications related to bowel motility Outcome: Adequate for Discharge Goal: Will not experience complications related to urinary retention Outcome: Adequate for Discharge   Problem: Pain Managment: Goal: General experience of comfort will improve Outcome: Adequate for Discharge   Problem: Safety: Goal: Ability to remain free from injury will improve Outcome: Adequate for Discharge   Problem: Skin Integrity: Goal: Risk for impaired skin integrity will decrease Outcome: Adequate for Discharge    Haydee Salter, RN

## 2022-07-19 ENCOUNTER — Other Ambulatory Visit: Payer: Self-pay

## 2022-07-19 ENCOUNTER — Other Ambulatory Visit: Payer: Self-pay | Admitting: Internal Medicine

## 2022-07-19 ENCOUNTER — Telehealth: Payer: Self-pay | Admitting: Internal Medicine

## 2022-07-19 DIAGNOSIS — D500 Iron deficiency anemia secondary to blood loss (chronic): Secondary | ICD-10-CM

## 2022-07-19 DIAGNOSIS — E611 Iron deficiency: Secondary | ICD-10-CM | POA: Insufficient documentation

## 2022-07-19 NOTE — Telephone Encounter (Signed)
M-new referral from hospital; Dr. Miki Kins deficient anemia.   Please have the patient follow-up-2 to 3 weeks; MD- labs-CBC CMP; LDH ; haptoglobin ; hold tube; Venofer.   Thanks GB

## 2022-07-19 NOTE — Telephone Encounter (Signed)
Lab orders entered

## 2022-07-19 NOTE — Telephone Encounter (Addendum)
Please schedule patient for Venofer as well.  MD ok with day 1 lab/MD day 2 Venofer.

## 2022-08-05 ENCOUNTER — Inpatient Hospital Stay: Payer: Medicaid Other

## 2022-08-05 ENCOUNTER — Inpatient Hospital Stay: Payer: Medicaid Other | Attending: Internal Medicine | Admitting: Internal Medicine

## 2022-08-05 ENCOUNTER — Encounter: Payer: Self-pay | Admitting: Internal Medicine

## 2022-08-05 VITALS — BP 133/82 | HR 78

## 2022-08-05 DIAGNOSIS — E611 Iron deficiency: Secondary | ICD-10-CM

## 2022-08-05 DIAGNOSIS — Z8616 Personal history of COVID-19: Secondary | ICD-10-CM | POA: Insufficient documentation

## 2022-08-05 DIAGNOSIS — J984 Other disorders of lung: Secondary | ICD-10-CM | POA: Insufficient documentation

## 2022-08-05 DIAGNOSIS — Z79899 Other long term (current) drug therapy: Secondary | ICD-10-CM | POA: Diagnosis not present

## 2022-08-05 DIAGNOSIS — Z87891 Personal history of nicotine dependence: Secondary | ICD-10-CM | POA: Diagnosis not present

## 2022-08-05 DIAGNOSIS — I517 Cardiomegaly: Secondary | ICD-10-CM | POA: Insufficient documentation

## 2022-08-05 DIAGNOSIS — Z930 Tracheostomy status: Secondary | ICD-10-CM | POA: Diagnosis not present

## 2022-08-05 DIAGNOSIS — Z79890 Hormone replacement therapy (postmenopausal): Secondary | ICD-10-CM | POA: Insufficient documentation

## 2022-08-05 DIAGNOSIS — R200 Anesthesia of skin: Secondary | ICD-10-CM | POA: Insufficient documentation

## 2022-08-05 DIAGNOSIS — D509 Iron deficiency anemia, unspecified: Secondary | ICD-10-CM | POA: Insufficient documentation

## 2022-08-05 DIAGNOSIS — Z7951 Long term (current) use of inhaled steroids: Secondary | ICD-10-CM | POA: Diagnosis not present

## 2022-08-05 DIAGNOSIS — Z8541 Personal history of malignant neoplasm of cervix uteri: Secondary | ICD-10-CM | POA: Insufficient documentation

## 2022-08-05 DIAGNOSIS — D500 Iron deficiency anemia secondary to blood loss (chronic): Secondary | ICD-10-CM

## 2022-08-05 LAB — CBC WITH DIFFERENTIAL/PLATELET
Abs Immature Granulocytes: 0.01 10*3/uL (ref 0.00–0.07)
Basophils Absolute: 0.1 10*3/uL (ref 0.0–0.1)
Basophils Relative: 1 %
Eosinophils Absolute: 0 10*3/uL (ref 0.0–0.5)
Eosinophils Relative: 1 %
HCT: 34.1 % — ABNORMAL LOW (ref 36.0–46.0)
Hemoglobin: 10.5 g/dL — ABNORMAL LOW (ref 12.0–15.0)
Immature Granulocytes: 0 %
Lymphocytes Relative: 22 %
Lymphs Abs: 1.5 10*3/uL (ref 0.7–4.0)
MCH: 24.9 pg — ABNORMAL LOW (ref 26.0–34.0)
MCHC: 30.8 g/dL (ref 30.0–36.0)
MCV: 81 fL (ref 80.0–100.0)
Monocytes Absolute: 0.3 10*3/uL (ref 0.1–1.0)
Monocytes Relative: 5 %
Neutro Abs: 4.7 10*3/uL (ref 1.7–7.7)
Neutrophils Relative %: 71 %
Platelets: 369 10*3/uL (ref 150–400)
RBC: 4.21 MIL/uL (ref 3.87–5.11)
RDW: 19.1 % — ABNORMAL HIGH (ref 11.5–15.5)
WBC: 6.6 10*3/uL (ref 4.0–10.5)
nRBC: 0 % (ref 0.0–0.2)

## 2022-08-05 LAB — COMPREHENSIVE METABOLIC PANEL
ALT: 14 U/L (ref 0–44)
AST: 16 U/L (ref 15–41)
Albumin: 4.4 g/dL (ref 3.5–5.0)
Alkaline Phosphatase: 66 U/L (ref 38–126)
Anion gap: 6 (ref 5–15)
BUN: 11 mg/dL (ref 6–20)
CO2: 25 mmol/L (ref 22–32)
Calcium: 9.2 mg/dL (ref 8.9–10.3)
Chloride: 107 mmol/L (ref 98–111)
Creatinine, Ser: 0.83 mg/dL (ref 0.44–1.00)
GFR, Estimated: >60 mL/min (ref >60)
Glucose, Bld: 105 mg/dL — ABNORMAL HIGH (ref 70–99)
Potassium: 4 mmol/L (ref 3.5–5.1)
Sodium: 138 mmol/L (ref 135–145)
Total Bilirubin: 0.4 mg/dL (ref 0.3–1.2)
Total Protein: 7.9 g/dL (ref 6.5–8.1)

## 2022-08-05 LAB — LACTATE DEHYDROGENASE: LDH: 118 U/L (ref 98–192)

## 2022-08-05 MED ORDER — SODIUM CHLORIDE 0.9 % IV SOLN
Freq: Once | INTRAVENOUS | Status: AC
  Filled 2022-08-05: qty 250

## 2022-08-05 MED ORDER — SODIUM CHLORIDE 0.9 % IV SOLN
200.0000 mg | Freq: Once | INTRAVENOUS | Status: AC
  Administered 2022-08-05: 200 mg via INTRAVENOUS
  Filled 2022-08-05: qty 200

## 2022-08-05 MED ORDER — SODIUM CHLORIDE 0.9% FLUSH
10.0000 mL | Freq: Once | INTRAVENOUS | Status: AC | PRN
  Administered 2022-08-05: 10 mL
  Filled 2022-08-05: qty 10

## 2022-08-05 NOTE — Patient Instructions (Signed)

## 2022-08-05 NOTE — Assessment & Plan Note (Addendum)
#  Chronic severe anemia- Hb- 7-8; iron saturation 2% [AUG 2023]-very symptomatic.  Likely secondary to menorrhagia.  Lack of improvement on oral iron.  Discussed regarding IV iron infusion/Venofer. Discussed the potential acute infusion reactions with IV iron; which are quite rare.  Patient understands the risk; will proceed with infusions.  Patient has previous infusions-so proceed with IV infusion.  #Etiology of iron deficiency: Likely secondary to menorrhagia.  Will refer to Dr. Amalia Hailey gynecology.  However patient will need GI evaluation does endoscopies. Consider GI referral.   #Respiratory failure-COVID 2021 currently s/p trach-clinically stable.   Thank you Dr.Patel  for allowing me to participate in the care of your pleasant patient. Please do not hesitate to contact me with questions or concerns in the interim.  # DISPOSITION: # labs today # venofer today # venofer weekly x 3 # refer to Gynecology- Dr.Evans re: heavy menstrual cycles.  # follow up in 6-8 weeks- MD: labs- cbc/possible Venofer- Dr.B  # follow up in 6 weeks MD: labs- cbc;possible venofer-Dr.B

## 2022-08-05 NOTE — Progress Notes (Signed)
No concerns. 

## 2022-08-05 NOTE — Progress Notes (Signed)
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  She is accompanied by husband.  Kathy Vega 46 y.o.  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   Patient was recently admitted to the hospital for hemoglobin down to 7.  Patient was symptomatic with dizziness.  She needed a transfusion and also iron infusions in the hospital.  She can continues to feel extremely fatigued.  She continues to complain of cold intolerance.  Tingling and numbness.  Blood in stools: none Blood in urine:none  Difficulty swallowing: Change of bowel movement/constipation:none Prior blood transfusion: many Liver disease: none Alcohol: none Bariatric surgery:none  Vaginal bleeding: very heavy; started on ; seen Dr.Evans in past "fibroids- lining was thickening" Prior evaluation with hematology:none Prior bone marrow biopsy: none Oral iron: one a day;  caused GI issues.  Prior IV iron infusions: yes.     Review of Systems  Constitutional:  Positive for malaise/fatigue. Negative for chills, diaphoresis, fever and weight loss.  HENT:  Negative for nosebleeds and sore throat.    Eyes:  Negative for double vision.  Respiratory:  Negative for cough, hemoptysis, sputum production, shortness of breath and wheezing.   Cardiovascular:  Negative for chest pain, palpitations, orthopnea and leg swelling.  Gastrointestinal:  Negative for abdominal pain, blood in stool, constipation, diarrhea, heartburn, melena, nausea and vomiting.  Genitourinary:  Negative for dysuria, frequency and urgency.  Musculoskeletal:  Positive for back pain and joint pain.  Skin: Negative.  Negative for itching and rash.  Neurological:  Negative for dizziness, tingling, focal weakness, weakness and headaches.  Endo/Heme/Allergies:  Does not bruise/bleed easily.  Psychiatric/Behavioral:  Negative for depression. The patient is not nervous/anxious and does not have insomnia.      MEDICAL HISTORY:  Past Medical History:  Diagnosis Date   Anemia    Cervical cancer ([place])    Thyroid disease     SURGICAL HISTORY: Past Surgical History:  Procedure Laterality Date   CARDIAC CATHETERIZATION     CERVICAL BIOPSY  W/ LOOP ELECTRODE EXCISION     CESAREAN SECTION     CHOLECYSTECTOMY     LEG SURGERY     TRACHEOSTOMY TUBE PLACEMENT N/A 09/04/2020   Procedure: TRACHEOSTOMY;  Surgeon: Beverly Gust, MD;  Location: [facility] ORS;  Service: ENT;  Laterality: N/A;    SOCIAL HISTORY: Social History   Socioeconomic History   Marital status: Married    Spouse name: Not on file   Number of children: Not on file   Years of education: Not on file   Highest education level: Not on file  Occupational History   Not on file  Tobacco Use   Smoking status: Former    Packs/day: 0.50  Years: 5.00    Total pack years: 2.50    Types: Cigarettes    Quit date: 2000    Years since quitting: 23.7   Smokeless tobacco: Never  Vaping Use   Vaping Use: Never used  Substance and Sexual Activity   Alcohol use: No   Drug use: Never   Sexual activity: Yes    Birth control/protection: None  Other Topics Concern    Not on file  Social History Narrative   Not on file   Social Determinants of Health   Financial Resource Strain: Not on file  Food Insecurity: Not on file  Transportation Needs: Not on file  Physical Activity: Not on file  Stress: Not on file  Social Connections: Not on file  Intimate Partner Violence: Not on file    FAMILY HISTORY: Family History  Problem Relation Age of Onset   Hyperlipidemia Mother    Hypertension Mother    Heart disease Father    Liver cancer Father    Breast cancer Neg Hx    Ovarian cancer Neg Hx     ALLERGIES:  has No Known Allergies.  MEDICATIONS:  Current Outpatient Medications  Medication Sig Dispense Refill   acetaminophen (TYLENOL) 500 MG tablet Take 500 mg by mouth every 6 (six) hours as needed for headache, fever, moderate pain or mild pain.     albuterol (PROVENTIL) (2.5 MG/3ML) 0.083% nebulizer solution Take 3 mLs (2.5 mg total) by nebulization every 6 (six) hours as needed for wheezing or shortness of breath. 360 mL 11   ascorbic acid (VITAMIN C) 500 MG tablet Take 1 tablet (500 mg total) by mouth daily. 30 tablet 0   budesonide (PULMICORT) 0.5 MG/2ML nebulizer solution Take 2 mLs (0.5 mg total) by nebulization 2 (two) times daily. 120 mL 5   hydrOXYzine (ATARAX) 10 MG tablet Take 10 mg by mouth 3 (three) times daily.     iron polysaccharides (NIFEREX) 150 MG capsule Take 1 capsule (150 mg total) by mouth daily. 30 capsule 1   levothyroxine (SYNTHROID) 75 MCG tablet Take 3 tablets (225 mcg total) by mouth daily at 6 (six) AM. 30 tablet 2   nystatin cream (MYCOSTATIN) Apply topically 2 (two) times daily. 30 g 0   pantoprazole (PROTONIX) 40 MG tablet Take 1 tablet (40 mg total) by mouth daily. 30 tablet 2   propranolol (INDERAL) 10 MG tablet Take 1 tablet (10 mg total) by mouth 3 (three) times daily as needed (palpitations). 90 tablet 6   furosemide (LASIX) 20 MG tablet Take 1 tablet (20 mg total) by mouth daily as needed for edema (shortness  of breath). (Patient not taking: Reported on 07/17/2022) 90 tablet 3   No current facility-administered medications for this visit.     Marland Kitchen  PHYSICAL EXAMINATION:   Vitals:   08/05/22 1119  BP: 132/89  Pulse: 75  Temp: (!) 97.3 F (36.3 C)  SpO2: 100%   Filed Weights   08/05/22 1119  Weight: 274 lb 12.8 oz (124.6 kg)    Physical Exam Vitals and nursing note reviewed.  HENT:     Head: Normocephalic and atraumatic.     Mouth/Throat:     Pharynx: Oropharynx is clear.  Eyes:     Extraocular Movements: Extraocular movements intact.     Pupils: Pupils are equal, round, and reactive to light.  Cardiovascular:     Rate and Rhythm: Normal rate and regular rhythm.  Pulmonary:     Comments: Decreased breath sounds bilaterally.  Abdominal:     Palpations: Abdomen is soft.  Musculoskeletal:        General: Normal range of motion.     Cervical back: Normal range of motion.  Skin:    General: Skin is warm.  Neurological:     General: No focal deficit present.     Mental Status: She is alert and oriented to person, place, and time.  Psychiatric:        Behavior: Behavior normal.        Judgment: Judgment normal.      LABORATORY DATA:  I have reviewed the data as listed Lab Results  Component Value Date   WBC 6.6 08/05/2022   HGB 10.5 (L) 08/05/2022   HCT 34.1 (L) 08/05/2022   MCV 81.0 08/05/2022   PLT 369 08/05/2022   Recent Labs    04/01/22 1034 04/27/22 0845 07/16/22 1858 07/18/22 0429  NA 139 136 137 139  K 3.6 3.6 3.4* 4.0  CL 104 100 104 107  CO2 _0 GLUCOSE 102* 128* 111* 114*  BUN _1 CREATININE 0.79 0.89 0.80 0.79  CALCIUM 9.5 9.7 9.1 9.2  GFRNONAA >60 >60 >60 >60  PROT 7.5  --   --  6.7  ALBUMIN 4.2  --   --  3.6  AST 23  --   --  19  ALT 15  --   --  12  ALKPHOS 69  --   --  54  BILITOT 0.7  --   --  0.8     DG Chest 2 View  Result Date: 07/16/2022 CLINICAL DATA:  Chest tightness. EXAM: CHEST - 2 VIEW COMPARISON:   04/27/2022 FINDINGS: Tracheostomy tube tip projects over the level of the clavicular heads. Stable lung volumes with multifocal areas of scarring. Stable mild cardiomegaly with unchanged mediastinal contours. No acute airspace disease, pleural effusion, or pneumothorax. No pulmonary edema. No acute osseous abnormalities are seen. IMPRESSION: 1. No acute findings. 2. Stable mild cardiomegaly and multifocal areas of scarring. 3. Tracheostomy tube remains in place. Electronically Signed   By: Keith Rake M.D.   On: 07/16/2022 18:36   DG Neck Soft Tissue  Result Date: 07/16/2022 CLINICAL DATA:  Chest tightness. Patient reports tracheostomy feels tight and pinching her. EXAM: NECK SOFT TISSUES - 1+ VIEW COMPARISON:  Neck CT 12/18/2020 FINDINGS: Tracheostomy tube projects over the tracheal air column with tip at the level of the clavicular heads. There is no subcutaneous emphysema. No epiglottic enlargement. No prevertebral soft tissue thickening. Soft tissue planes are non suspicious. IMPRESSION: Tracheostomy tube in place, tip projecting over the tracheal air column at the level of the clavicular heads. Electronically Signed   By: Keith Rake M.D.   On: 07/16/2022 18:35    ASSESSMENT & PLAN:   Iron deficiency #Chronic severe anemia- Hb- 7-8; iron saturation 2% [AUG 2023]-very symptomatic.  Likely secondary to menorrhagia.  Lack of improvement on oral iron.  Discussed regarding IV iron infusion/Venofer. Discussed the potential acute infusion reactions with IV iron; which are quite rare.  Patient understands the risk; will proceed with infusions.  Patient has previous infusions-so proceed with IV infusion.  #Etiology of iron deficiency: Likely secondary to menorrhagia.  Will refer to Dr. Amalia Hailey gynecology.  However patient will need GI evaluation does endoscopies. Consider GI referral.   #Respiratory failure-COVID 2021 currently s/p trach-clinically stable.   Thank you Dr.Patel  for allowing me to  participate in the care of  your pleasant patient. Please do not hesitate to contact me with questions or concerns in the interim.  # DISPOSITION: # labs today # venofer today # venofer weekly x 3 # refer to Gynecology- Dr.Evans re: heavy menstrual cycles.  # follow up in 6-8 weeks- MD: labs- cbc/possible Venofer- Dr.B  # follow up in 6 weeks MD: labs- cbc;possible venofer-Dr.B    All questions were answered. The patient knows to call the clinic with any problems, questions or concerns.    Cammie Sickle, MD 08/05/2022 12:58 PM

## 2022-08-05 NOTE — Progress Notes (Signed)
Patient tolerated initial Venofer infusion well, no questions/concerns voiced. Monitored 30 min post transfusion. Patient stable at discharge. Refused AVS .

## 2022-08-06 LAB — HAPTOGLOBIN: Haptoglobin: 179 mg/dL (ref 42–296)

## 2022-08-11 ENCOUNTER — Inpatient Hospital Stay: Payer: Medicaid Other

## 2022-08-11 VITALS — BP 121/53 | HR 79 | Temp 98.7°F | Resp 18

## 2022-08-11 DIAGNOSIS — E611 Iron deficiency: Secondary | ICD-10-CM

## 2022-08-11 DIAGNOSIS — D509 Iron deficiency anemia, unspecified: Secondary | ICD-10-CM | POA: Diagnosis not present

## 2022-08-11 MED ORDER — SODIUM CHLORIDE 0.9 % IV SOLN
Freq: Once | INTRAVENOUS | Status: AC
  Filled 2022-08-11: qty 250

## 2022-08-11 MED ORDER — SODIUM CHLORIDE 0.9 % IV SOLN
200.0000 mg | Freq: Once | INTRAVENOUS | Status: AC
  Administered 2022-08-11: 200 mg via INTRAVENOUS
  Filled 2022-08-11: qty 200

## 2022-08-11 NOTE — Patient Instructions (Signed)
[facility]  Discharge Instructions: Thank you for choosing [facility] to provide your oncology and hematology care.  If you have a lab appointment with the Cancer Center, please go directly to the Cancer Center and check in at the registration area.  Wear comfortable clothing and clothing appropriate for easy access to any Portacath or PICC line.   We strive to give you quality time with your provider. You may need to reschedule your appointment if you arrive late (15 or more minutes).  Arriving late affects you and other patients whose appointments are after yours.  Also, if you miss three or more appointments without notifying the office, you may be dismissed from the clinic at the provider's discretion.      For prescription refill requests, have your pharmacy contact our office and allow 72 hours for refills to be completed.    Today you received the following chemotherapy and/or immunotherapy agents Iron Sucrose.      To help prevent nausea and vomiting after your treatment, we encourage you to take your nausea medication as directed.  BELOW ARE SYMPTOMS THAT SHOULD BE REPORTED IMMEDIATELY: *FEVER GREATER THAN 100.4 F (38 C) OR HIGHER *CHILLS OR SWEATING *NAUSEA AND VOMITING THAT IS NOT CONTROLLED WITH YOUR NAUSEA MEDICATION *UNUSUAL SHORTNESS OF BREATH *UNUSUAL BRUISING OR BLEEDING *URINARY PROBLEMS (pain or burning when urinating, or frequent urination) *BOWEL PROBLEMS (unusual diarrhea, constipation, pain near the anus) TENDERNESS IN MOUTH AND THROAT WITH OR WITHOUT PRESENCE OF ULCERS (sore throat, sores in mouth, or a toothache) UNUSUAL RASH, SWELLING OR PAIN  UNUSUAL VAGINAL DISCHARGE OR ITCHING   Items with * indicate a potential emergency and should be followed up as soon as possible or go to the Emergency Department if any problems should occur.  Please show the CHEMOTHERAPY ALERT CARD or IMMUNOTHERAPY ALERT CARD at check-in  to the Emergency Department and triage nurse.  Should you have questions after your visit or need to cancel or reschedule your appointment, please contact [facility]  [phone] and follow the prompts.  Office hours are 8:00 a.m. to 4:30 p.m. Monday - Friday. Please note that voicemails left after 4:00 p.m. may not be returned until the following business day.  We are closed weekends and major holidays. You have access to a nurse at all times for urgent questions. Please call the main number to the clinic [phone] and follow the prompts.  For any non-urgent questions, you may also contact your provider using MyChart. We now offer e-Visits for anyone 18 and older to request care online for non-urgent symptoms. For details visit [email or web address].   Also download the MyChart app! Go to the app store, search "MyChart", open the app, select [place], and log in with your MyChart username and password.  Masks are optional in the cancer centers. If you would like for your care team to wear a mask while they are taking care of you, please let them know. For doctor visits, patients may have with them one support person who is at least 47 years old. At this time, visitors are not allowed in the infusion area.   

## 2022-08-12 ENCOUNTER — Encounter: Payer: Self-pay | Admitting: Pulmonary Disease

## 2022-08-12 ENCOUNTER — Ambulatory Visit (INDEPENDENT_AMBULATORY_CARE_PROVIDER_SITE_OTHER): Payer: Medicaid Other | Admitting: Pulmonary Disease

## 2022-08-12 VITALS — BP 100/70 | HR 85 | Temp 98.1°F | Ht 69.0 in | Wt 268.8 lb

## 2022-08-12 DIAGNOSIS — J4540 Moderate persistent asthma, uncomplicated: Secondary | ICD-10-CM

## 2022-08-12 DIAGNOSIS — E039 Hypothyroidism, unspecified: Secondary | ICD-10-CM | POA: Diagnosis not present

## 2022-08-12 DIAGNOSIS — G4736 Sleep related hypoventilation in conditions classified elsewhere: Secondary | ICD-10-CM

## 2022-08-12 DIAGNOSIS — E669 Obesity, unspecified: Secondary | ICD-10-CM

## 2022-08-12 DIAGNOSIS — J386 Stenosis of larynx: Secondary | ICD-10-CM | POA: Diagnosis not present

## 2022-08-12 NOTE — Patient Instructions (Signed)
We are going to see if we can get you an appointment at the [facility]ology [facility] at [facility].  Continue follow-ups with Dr. Barbee Shropshire.  Continue your iron infusions.  See you in follow-up in 3 months time call sooner should any new problems arise.

## 2022-08-12 NOTE — Progress Notes (Signed)
Subjective:    Patient ID: Kathy Vega, female    DOB: 1976/07/17, 46 y.o.   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  Patient was last seen here on 12 May 2022 by me.  This is a scheduled visit.  As noted she has tracheal stenosis at has been followed by Dr. Beverely Vega at [facility]  In the recent past she had had a [place] cannula placed but however due to recurrent mucous plugging and visits to the ED due to the same she had the trach switched back to a #4 [place]. She is tolerating PMV for the most part but does get breathless sometimes and takes it off.  She does use oxygen nocturnally and as needed during the day.   Regards to her respiratory medications she is on albuterol and Pulmicort via nebulizer.  She endorses compliance with these.  She does have moderate persistent asthma and does need these medications. She is unable to use inhalers due to her tracheostomy dependence status.    After her visit here in June she had an admission on 19 August due to severe anemia from menorrhagia.  She has required iron infusions due to severe iron deficiency.  She is following up with GYN.  The other issue with Kathy Vega is her severe hypothyroidism.  TSH last measured this was 167.  Previously it was 44.  At her last visit she was referred to endocrinology at [facility] however she has not received a call from them.  She would like to go to [employer] [facility] if possible.  She is on Synthroid 225 mcg daily after adjustment was made by the hospitalist upon discharge during her August admission.    She does not have any new  significant complaints.  Continues to be fatigued and very short of breath.  Still disillusioned because her trach remains due to tracheal stenosis.  She is to have tracheal surgery by Dr. Beverely Vega soon.     Review of Systems A 10 point review of systems was performed and it is as noted above otherwise negative.  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) 09/18/2020   Acute respiratory failure ([place])    Pneumonia due to COVID-19 virus 08/13/2020   Acute renal failure (ARF) ([place]) 08/13/2020   Elevated liver enzymes 08/13/2020   CAP (community acquired pneumonia) 08/13/2020   Acute encephalopathy 08/13/2020   Hypokalemia 08/13/2020   Social History   Tobacco Use   Smoking status: Former    Packs/day: 0.50    Years: 5.00    Total pack years: 2.50    Types: Cigarettes    Quit date: 2000    Years since quitting: 23.7   Smokeless tobacco: Never  Substance Use Topics  Alcohol use: No   No Known Allergies  Current Meds  Medication Sig   acetaminophen (TYLENOL) 500 MG tablet Take 500 mg by mouth every 6 (six) hours as needed for headache, fever, moderate pain or mild pain.   albuterol (PROVENTIL) (2.5 MG/3ML) 0.083% nebulizer solution Take 3 mLs (2.5 mg total) by nebulization every 6 (six) hours as needed for wheezing or shortness of breath.   ascorbic acid (VITAMIN C) 500 MG tablet Take 1 tablet (500 mg total) by mouth daily.   budesonide (PULMICORT) 0.5 MG/2ML nebulizer solution Take 2 mLs (0.5 mg total) by nebulization 2 (two) times daily.   furosemide (LASIX) 20 MG  tablet Take 1 tablet (20 mg total) by mouth daily as needed for edema (shortness of breath).   hydrOXYzine (ATARAX) 10 MG tablet Take 10 mg by mouth 3 (three) times daily.   iron polysaccharides (NIFEREX) 150 MG capsule Take 1 capsule (150 mg total) by mouth daily.   levothyroxine (SYNTHROID) 75 MCG tablet Take 3 tablets (225 mcg total) by mouth daily at 6 (six) AM.   nystatin cream (MYCOSTATIN) Apply topically 2 (two) times daily.   pantoprazole (PROTONIX) 40 MG tablet Take 1 tablet (40 mg total) by mouth daily.   propranolol (INDERAL) 10 MG tablet Take 1 tablet (10 mg total) by mouth 3 (three) times daily as needed (palpitations).    There is no immunization history on file for this patient.      Objective:   Physical Exam BP 100/70 (BP Location: Right Arm, Patient Position: Sitting, Cuff Size: Large)   Pulse 85   Temp 98.1 F (36.7 C) (Oral)   Ht '5\' 9"'$  (1.753 m)   Wt 268 lb 12.8 oz (121.9 kg)   SpO2 99%   BMI 39.69 kg/m  GENERAL: Obese woman, no acute distress, tracheostomy in place.  He has a Passy-Muir valve and able to speak without difficulty.  Uses trach collar for supplemental oxygen at night. HEAD: Normocephalic, atraumatic.  EYES: Pupils equal, round, reactive to light.  No scleral icterus.  MOUTH: Oral mucosa moist.  No thrush. NECK: Supple. No lymphadenopathy.  Tracheostomy in place as above with Passy-Muir valve. No JVD.  Trachea midline. PULMONARY: Good air entry bilaterally.  Scattered end expiratory wheezes noted.   CARDIOVASCULAR: S1 and S2. Regular rate and rhythm.  No rubs, murmurs or gallops heard. ABDOMEN: Obese otherwise unremarkable. MUSCULOSKELETAL: No joint deformity, no clubbing, no edema.  NEUROLOGIC: No overt focal deficit. SKIN: Intact,warm,dry. PSYCH: Mood and behavior normal.  Recent Results (from the past 2160 hour(s))  Basic metabolic panel     Status: Abnormal   Collection Time: 07/16/22  6:58 PM  Result Value Ref Range   Sodium 137 135 -  145 mmol/L   Potassium 3.4 (L) 3.5 - 5.1 mmol/L   Chloride 104 98 - 111 mmol/L   CO2 25 22 - 32 mmol/L   Glucose, Bld 111 (H) 70 - 99 mg/dL    Comment: Glucose reference range applies only to samples taken after fasting for at least 8 hours.   BUN 8 6 - 20 mg/dL   Creatinine, Ser 0.80 0.44 - 1.00 mg/dL   Calcium 9.1 8.9 - 10.3 mg/dL   GFR, Estimated >60 >60 mL/min    Comment: (NOTE) Calculated using the CKD-EPI Creatinine Equation (2021)    Anion gap 8 5 - 15    Comment: Performed at [facility], [address] [place], [place] [address]  Troponin I (High Sensitivity)  Status: None   Collection Time: 07/16/22  6:58 PM  Result Value Ref Range   Troponin I (High Sensitivity) 5 <18 ng/L    Comment: (NOTE) Elevated high sensitivity troponin I (hsTnI) values and significant  changes across serial measurements may suggest ACS but many other  chronic and acute conditions are known to elevate hsTnI results.  Refer to the "Links" section for chest pain algorithms and additional  guidance. Performed at [facility], [address] [place], [place] [address]   CBC with Differential     Status: Abnormal   Collection Time: 07/16/22  6:58 PM  Result Value Ref Range   WBC 8.1 4.0 - 10.5 K/uL   RBC 3.11 (L) 3.87 - 5.11 MIL/uL   Hemoglobin 7.1 (L) 12.0 - 15.0 g/dL    Comment: Reticulocyte Hemoglobin testing may be clinically indicated, consider ordering this additional test [id number]    HCT 24.0 (L) 36.0 - 46.0 %   MCV 77.2 (L) 80.0 - 100.0 fL   MCH 22.8 (L) 26.0 - 34.0 pg   MCHC 29.6 (L) 30.0 - 36.0 g/dL   RDW 17.8 (H) 11.5 - 15.5 %   Platelets 494 (H) 150 - 400 K/uL   nRBC 0.2 0.0 - 0.2 %   Neutrophils Relative % 71 %   Neutro Abs 5.8 1.7 - 7.7 K/uL   Lymphocytes Relative 22 %   Lymphs Abs 1.8 0.7 - 4.0 K/uL   Monocytes Relative 4 %   Monocytes Absolute 0.4 0.1 - 1.0 K/uL   Eosinophils Relative 1 %   Eosinophils Absolute 0.1 0.0 - 0.5 K/uL   Basophils  Relative 1 %   Basophils Absolute 0.1 0.0 - 0.1 K/uL   Immature Granulocytes 1 %   Abs Immature Granulocytes 0.07 0.00 - 0.07 K/uL    Comment: Performed at [facility], [address] [place], [place] [address]  Prepare RBC (crossmatch)     Status: None   Collection Time: 07/16/22  7:52 PM  Result Value Ref Range   Order Confirmation      ORDER PROCESSED BY BLOOD BANK Performed at [facility], [place], [place] [address]   Troponin I (High Sensitivity)     Status: None   Collection Time: 07/16/22  9:03 PM  Result Value Ref Range   Troponin I (High Sensitivity) 4 <18 ng/L    Comment: (NOTE) Elevated high sensitivity troponin I (hsTnI) values and significant  changes across serial measurements may suggest ACS but many other  chronic and acute conditions are known to elevate hsTnI results.  Refer to the Links section for chest pain algorithms and additional  guidance. Performed at [facility], [address] [place],  [address]   Iron and TIBC     Status: Abnormal   Collection Time: 07/16/22  9:03 PM  Result Value Ref Range   Iron 32 28 - 170 ug/dL   TIBC 445 250 - 450 ug/dL   Saturation Ratios 7 (L) 10.4 - 31.8 %   UIBC 413 ug/dL    Comment: Performed at [facility], [address] [place], [place] [address]  Ferritin (Iron Binding Protein)     Status: Abnormal   Collection Time: 07/16/22  9:03 PM  Result Value Ref Range   Ferritin 5 (L) 11 - 307 ng/mL    Comment: Performed at [facility], [address] [place], [place] [address]  TSH     Status: Abnormal  Collection Time: 07/16/22  9:03 PM  Result Value Ref Range   TSH 167.262 (H) 0.350 - 4.500 uIU/mL    Comment: Performed by a 3rd Generation assay with a functional sensitivity of <=0.01 uIU/mL. Performed at [facility], [address] [place], [place] [address]   Vitamin B12     Status: None   Collection Time: 07/16/22  9:03 PM   Result Value Ref Range   Vitamin B-12 230 180 - 914 pg/mL    Comment: (NOTE) This assay is not validated for testing neonatal or myeloproliferative syndrome specimens for Vitamin B12 levels. Performed at [facility], [address] [address], [place], [place] [address]   Folate, serum, performed at [facility] [place] [facility] lab     Status: None   Collection Time: 07/16/22  9:03 PM  Result Value Ref Range   Folate 15.1 >5.9 ng/mL    Comment: Performed at [facility], [address] [place], [place] [address]  Type and screen [place]     Status: None   Collection Time: 07/16/22  9:03 PM  Result Value Ref Range   ABO/RH(D) O POS    Antibody Screen NEG    Sample Expiration 07/19/2022,2359    Unit Number [id number]    Blood Component Type RED CELLS,LR    Unit division 00    Status of Unit ISSUED,FINAL    Transfusion Status OK TO TRANSFUSE    Crossmatch Result Compatible    Unit Number [id number]    Blood Component Type RED CELLS,LR    Unit division 00    Status of Unit ISSUED,FINAL    Transfusion Status OK TO TRANSFUSE    Crossmatch Result      Compatible Performed at [facility], [address] [place], [place] [address]   BPAM RBC     Status: None   Collection Time: 07/16/22  9:03 PM  Result Value Ref Range   ISSUE DATE / TIME 092330076226    Blood Product Unit Number [id number]    PRODUCT CODE [id number]    Unit Type and Rh 5100    Blood Product Expiration Date 811572620355    ISSUE DATE / TIME 974163845364    Blood Product Unit Number [id number]    PRODUCT CODE [id number]    Unit Type and Rh 5100    Blood Product Expiration Date 037048889169   CBC     Status: Abnormal   Collection Time: 07/17/22  2:08 AM  Result Value Ref Range   WBC 7.9 4.0 - 10.5 K/uL   RBC 3.13 (L) 3.87 - 5.11 MIL/uL   Hemoglobin 7.4 (L) 12.0 - 15.0 g/dL   HCT 24.4 (L) 36.0 - 46.0 %   MCV 78.0 (L) 80.0 - 100.0 fL   MCH 23.6 (L) 26.0 - 34.0 pg   MCHC  30.3 30.0 - 36.0 g/dL   RDW 17.8 (H) 11.5 - 15.5 %   Platelets 447 (H) 150 - 400 K/uL   nRBC 0.4 (H) 0.0 - 0.2 %    Comment: Performed at [facility], [address] [place], [place] [address]  Prepare RBC (crossmatch)     Status: None   Collection Time: 07/17/22 10:28 AM  Result Value Ref Range   Order Confirmation      ORDER PROCESSED BY BLOOD BANK Performed at [facility], [address] [place], [place] [address]   Hemoglobin and hematocrit, blood     Status: Abnormal   Collection Time: 07/17/22  8:54 PM  Result Value Ref Range   Hemoglobin 8.9 (L) 12.0 - 15.0 g/dL   HCT 29.0 (L) 36.0 - 46.0 %    Comment: Performed at [facility], [address] [place], [place] [address]  HIV Antibody (routine testing w rflx)     Status: None   Collection Time: 07/18/22  4:29 AM  Result Value Ref Range   HIV Screen 4th Generation wRfx Non Reactive Non Reactive    Comment: Performed at [facility], [place] [address], [place], [place] [address]  Comprehensive metabolic panel     Status: Abnormal   Collection Time: 07/18/22  4:29 AM  Result Value Ref Range   Sodium 139 135 - 145 mmol/L   Potassium 4.0 3.5 - 5.1 mmol/L   Chloride 107 98 - 111 mmol/L   CO2 26 22 - 32 mmol/L   Glucose, Bld 114 (H) 70 - 99 mg/dL    Comment: Glucose reference range applies only to samples taken after fasting for at least 8 hours.   BUN 8 6 - 20 mg/dL   Creatinine, Ser 0.79 0.44 - 1.00 mg/dL   Calcium 9.2 8.9 - 10.3 mg/dL   Total Protein 6.7 6.5 - 8.1 g/dL   Albumin 3.6 3.5 - 5.0 g/dL   AST 19 15 - 41 U/L   ALT 12 0 - 44 U/L   Alkaline Phosphatase 54 38 - 126 U/L   Total Bilirubin 0.8 0.3 - 1.2 mg/dL   GFR, Estimated >60 >60 mL/min    Comment: (NOTE) Calculated using the CKD-EPI Creatinine Equation (2021)    Anion gap 6 5 - 15    Comment: Performed at [facility], [address] [place], [place] [address]  Magnesium     Status: None   Collection Time:  07/18/22  4:29 AM  Result Value Ref Range   Magnesium 2.2 1.7 - 2.4 mg/dL    Comment: Performed at [facility], [address] [place], [place] [address]  Phosphorus     Status: None   Collection Time: 07/18/22  4:29 AM  Result Value Ref Range   Phosphorus 3.9 2.5 - 4.6 mg/dL    Comment: Performed at [facility], [address] [place], [place] [address]  CBC with Differential/Platelet     Status: Abnormal   Collection Time: 07/18/22  4:29 AM  Result Value Ref Range   WBC 7.6 4.0 - 10.5 K/uL   RBC 3.60 (L) 3.87 - 5.11 MIL/uL   Hemoglobin 8.8 (L) 12.0 - 15.0 g/dL   HCT 28.4 (L) 36.0 - 46.0 %   MCV 78.9 (L) 80.0 - 100.0 fL   MCH 24.4 (L) 26.0 - 34.0 pg   MCHC 31.0 30.0 - 36.0 g/dL   RDW 17.2 (H) 11.5 - 15.5 %   Platelets 403 (H) 150 - 400 K/uL   nRBC 0.5 (H) 0.0 - 0.2 %   Neutrophils Relative % 70 %   Neutro Abs 5.4 1.7 - 7.7 K/uL   Lymphocytes Relative 20 %   Lymphs Abs 1.5 0.7 - 4.0 K/uL   Monocytes Relative 6 %   Monocytes Absolute 0.5 0.1 - 1.0 K/uL   Eosinophils Relative 2 %   Eosinophils Absolute 0.1 0.0 - 0.5 K/uL   Basophils Relative 1 %   Basophils Absolute 0.1 0.0 - 0.1 K/uL   Immature Granulocytes 1 %   Abs Immature Granulocytes 0.05 0.00 - 0.07 K/uL    Comment: Performed at [facility]  [facility], [place], [place] [address]  Lactic acid, plasma     Status: None   Collection Time: 07/18/22  4:29 AM  Result Value Ref Range   Lactic Acid, Venous 1.9 0.5 - 1.9 mmol/L    Comment: Performed at [facility], [address] [place], [place] [address]  Haptoglobin     Status: None   Collection Time: 08/05/22 12:27 PM  Result Value Ref Range   Haptoglobin 179 42 - 296 mg/dL    Comment: (NOTE) Performed At: [facility] [place], [place] [id number] Rush Farmer MD [id number]   Lactate dehydrogenase     Status: None   Collection Time: 08/05/22 12:27 PM  Result Value Ref Range   LDH 118 98 -  192 U/L    Comment: Performed at [facility], [address] [place], [place] [address]  Comprehensive metabolic panel     Status: Abnormal   Collection Time: 08/05/22 12:27 PM  Result Value Ref Range   Sodium 138 135 - 145 mmol/L   Potassium 4.0 3.5 - 5.1 mmol/L   Chloride 107 98 - 111 mmol/L   CO2 25 22 - 32 mmol/L   Glucose, Bld 105 (H) 70 - 99 mg/dL    Comment: Glucose reference range applies only to samples taken after fasting for at least 8 hours.   BUN 11 6 - 20 mg/dL   Creatinine, Ser 0.83 0.44 - 1.00 mg/dL   Calcium 9.2 8.9 - 10.3 mg/dL   Total Protein 7.9 6.5 - 8.1 g/dL   Albumin 4.4 3.5 - 5.0 g/dL   AST 16 15 - 41 U/L   ALT 14 0 - 44 U/L   Alkaline Phosphatase 66 38 - 126 U/L   Total Bilirubin 0.4 0.3 - 1.2 mg/dL   GFR, Estimated >60 >60 mL/min    Comment: (NOTE) Calculated using the CKD-EPI Creatinine Equation (2021)    Anion gap 6 5 - 15    Comment: Performed at [facility], [address] [place], [place] [address]  CBC with Differential/Platelet     Status: Abnormal   Collection Time: 08/05/22 12:27 PM  Result Value Ref Range   WBC 6.6 4.0 - 10.5 K/uL   RBC 4.21 3.87 - 5.11 MIL/uL   Hemoglobin 10.5 (L) 12.0 - 15.0 g/dL   HCT 34.1 (L) 36.0 - 46.0 %   MCV 81.0 80.0 - 100.0 fL   MCH 24.9 (L) 26.0 - 34.0 pg   MCHC 30.8 30.0 - 36.0 g/dL   RDW 19.1 (H) 11.5 - 15.5 %   Platelets 369 150 - 400 K/uL   nRBC 0.0 0.0 - 0.2 %   Neutrophils Relative % 71 %   Neutro Abs 4.7 1.7 - 7.7 K/uL   Lymphocytes Relative 22 %   Lymphs Abs 1.5 0.7 - 4.0 K/uL   Monocytes Relative 5 %   Monocytes Absolute 0.3 0.1 - 1.0 K/uL   Eosinophils Relative 1 %   Eosinophils Absolute 0.0 0.0 - 0.5 K/uL   Basophils Relative 1 %   Basophils Absolute 0.1 0.0 - 0.1 K/uL   Immature Granulocytes 0 %   Abs Immature Granulocytes 0.01 0.00 - 0.07 K/uL    Comment: Performed at [facility], [address], [place], [place] [address]       Assessment & Plan:      ICD-10-CM   1. Moderate persistent asthma without complication  G64.40    Continue albuterol  and and Pulmicort via nebulizer    2. Hypothyroidism (acquired)  E03.9 Ambulatory referral to Endocrinology   Referral to [facility] Endocrinology Very difficult to control May need to change supplementation Query absorption    3. 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 Derrill Kay, MD Advanced Bronchoscopy P[facility]    *This note was dictated using voice recognition software/Dragon.  Despite best efforts to proofread, errors can occur which can change the meaning. Any transcriptional errors that result from this process are unintentional and may not be fully corrected at the time of dictation.

## 2022-08-14 ENCOUNTER — Emergency Department
Admission: EM | Admit: 2022-08-14 | Discharge: 2022-08-14 | Disposition: A | Discharge status: Home / Self Care | Payer: Medicaid Other | Attending: Emergency Medicine | Admitting: Emergency Medicine

## 2022-08-14 ENCOUNTER — Encounter: Payer: Self-pay | Admitting: *Deleted

## 2022-08-14 ENCOUNTER — Other Ambulatory Visit: Payer: Self-pay

## 2022-08-14 ENCOUNTER — Emergency Department: Payer: Medicaid Other

## 2022-08-14 DIAGNOSIS — D594 Other nonautoimmune hemolytic anemias: Secondary | ICD-10-CM | POA: Diagnosis not present

## 2022-08-14 DIAGNOSIS — Z8616 Personal history of COVID-19: Secondary | ICD-10-CM | POA: Insufficient documentation

## 2022-08-14 DIAGNOSIS — E039 Hypothyroidism, unspecified: Secondary | ICD-10-CM | POA: Diagnosis not present

## 2022-08-14 DIAGNOSIS — J45909 Unspecified asthma, uncomplicated: Secondary | ICD-10-CM | POA: Diagnosis not present

## 2022-08-14 DIAGNOSIS — D649 Anemia, unspecified: Secondary | ICD-10-CM

## 2022-08-14 DIAGNOSIS — N939 Abnormal uterine and vaginal bleeding, unspecified: Secondary | ICD-10-CM | POA: Diagnosis present

## 2022-08-14 LAB — COMPREHENSIVE METABOLIC PANEL
ALT: 26 U/L (ref 0–44)
AST: 26 U/L (ref 15–41)
Albumin: 4.4 g/dL (ref 3.5–5.0)
Alkaline Phosphatase: 68 U/L (ref 38–126)
Anion gap: 7 (ref 5–15)
BUN: 8 mg/dL (ref 6–20)
CO2: 23 mmol/L (ref 22–32)
Calcium: 9.1 mg/dL (ref 8.9–10.3)
Chloride: 109 mmol/L (ref 98–111)
Creatinine, Ser: 0.91 mg/dL (ref 0.44–1.00)
GFR, Estimated: >60 mL/min (ref >60)
Glucose, Bld: 127 mg/dL — ABNORMAL HIGH (ref 70–99)
Potassium: 4.1 mmol/L (ref 3.5–5.1)
Sodium: 139 mmol/L (ref 135–145)
Total Bilirubin: 0.5 mg/dL (ref 0.3–1.2)
Total Protein: 7.6 g/dL (ref 6.5–8.1)

## 2022-08-14 LAB — URINALYSIS, ROUTINE W REFLEX MICROSCOPIC
Bacteria, UA: NONE SEEN
RBC / HPF: >50 RBC/hpf — ABNORMAL HIGH (ref 0–5)
Specific Gravity, Urine: 1.023 (ref 1.005–1.030)
Squamous Epithelial / HPF: NONE SEEN (ref 0–5)
WBC, UA: >50 WBC/hpf — ABNORMAL HIGH (ref 0–5)

## 2022-08-14 LAB — PREPARE RBC (CROSSMATCH)

## 2022-08-14 LAB — CBC
HCT: 27 % — ABNORMAL LOW (ref 36.0–46.0)
Hemoglobin: 8.1 g/dL — ABNORMAL LOW (ref 12.0–15.0)
MCH: 25.2 pg — ABNORMAL LOW (ref 26.0–34.0)
MCHC: 30 g/dL (ref 30.0–36.0)
MCV: 83.9 fL (ref 80.0–100.0)
Platelets: 338 10*3/uL (ref 150–400)
RBC: 3.22 MIL/uL — ABNORMAL LOW (ref 3.87–5.11)
RDW: 20.1 % — ABNORMAL HIGH (ref 11.5–15.5)
WBC: 8 10*3/uL (ref 4.0–10.5)
nRBC: 0.3 % — ABNORMAL HIGH (ref 0.0–0.2)

## 2022-08-14 LAB — PROTIME-INR
INR: 1.1 (ref 0.8–1.2)
Prothrombin Time: 13.6 seconds (ref 11.4–15.2)

## 2022-08-14 LAB — CBC WITH DIFFERENTIAL/PLATELET
Abs Immature Granulocytes: 0.05 10*3/uL (ref 0.00–0.07)
Basophils Absolute: 0.1 10*3/uL (ref 0.0–0.1)
Basophils Relative: 1 %
Eosinophils Absolute: 0 10*3/uL (ref 0.0–0.5)
Eosinophils Relative: 0 %
HCT: 27 % — ABNORMAL LOW (ref 36.0–46.0)
Hemoglobin: 8.1 g/dL — ABNORMAL LOW (ref 12.0–15.0)
Immature Granulocytes: 0 %
Lymphocytes Relative: 19 %
Lymphs Abs: 2.2 10*3/uL (ref 0.7–4.0)
MCH: 24.5 pg — ABNORMAL LOW (ref 26.0–34.0)
MCHC: 30 g/dL (ref 30.0–36.0)
MCV: 81.6 fL (ref 80.0–100.0)
Monocytes Absolute: 0.5 10*3/uL (ref 0.1–1.0)
Monocytes Relative: 4 %
Neutro Abs: 8.5 10*3/uL — ABNORMAL HIGH (ref 1.7–7.7)
Neutrophils Relative %: 76 %
Platelets: 389 10*3/uL (ref 150–400)
RBC: 3.31 MIL/uL — ABNORMAL LOW (ref 3.87–5.11)
RDW: 20.1 % — ABNORMAL HIGH (ref 11.5–15.5)
WBC: 11.2 10*3/uL — ABNORMAL HIGH (ref 4.0–10.5)
nRBC: 0.3 % — ABNORMAL HIGH (ref 0.0–0.2)

## 2022-08-14 LAB — TYPE AND SCREEN

## 2022-08-14 MED ORDER — SODIUM CHLORIDE 0.9 % IV SOLN: 10.0000 mL/h | Freq: Once | INTRAVENOUS | Status: DC

## 2022-08-14 MED ORDER — LIDOCAINE 5 % EX PTCH: 1.0000 | MEDICATED_PATCH | Freq: Two times a day (BID) | CUTANEOUS | 0 refills | Status: AC

## 2022-08-14 MED ORDER — MEDROXYPROGESTERONE ACETATE 10 MG PO TABS: 10.0000 mg | ORAL_TABLET | Freq: Every day | ORAL | 0 refills | Status: DC

## 2022-08-14 NOTE — ED Provider Notes (Signed)
[facility] Provider Note    Event Date/Time   First MD Initiated Contact with Patient 08/14/22 1114     (approximate)   History   Abdominal Pain   HPI  Kathy Vega is a 46 y.o. 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  Patient reports that she has gone through 3 packs of pads in the past 2 days.  She reports that she is passing clots that are the size of a tennis ball.  She reports that she began to have light bleeding 2 weeks ago, but over the past 2 to 3 days the bleeding has become heavy.  She reports that she has abdominal cramping in the center of her abdomen and also the right side of her abdomen.  She reports that the pain radiates into her back.  She has not had any fevers or chills.  No nausea or vomiting.  She feels lightheaded and also feels like her heart is "skipping a beat."  No urinary symptoms.  She reports that she has had 2-3 blood transfusions in the past, most recently a couple of months ago.  She has not touch base with her OB/GYN doctor.  She is not on any hormones.  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) 09/18/2020   Acute respiratory failure ([place])    Pneumonia due to COVID-19 virus 08/13/2020   Acute renal failure (ARF) ([place]) 08/13/2020   Elevated liver enzymes 08/13/2020   CAP  (community acquired pneumonia) 08/13/2020   Acute encephalopathy 08/13/2020   Hypokalemia 08/13/2020          Physical Exam   Triage Vital Signs: ED Triage Vitals  Enc Vitals Group     BP 08/14/22 0933 123/81     Pulse Rate 08/14/22 0933 (!) 116     Resp 08/14/22 0933 18     Temp 08/14/22 0933 98.2 F (36.8 C)     Temp Source 08/14/22 0933 Oral     SpO2 08/14/22 0933 100 %     Weight 08/14/22 0933 268 lb (121.6 kg)     Height 08/14/22 0933 '5\' 9"'$  (1.753 m)     Head Circumference --      Peak Flow --      Pain Score 08/14/22 0942 10     Pain Loc --      Pain Edu? --      Excl. in [place]? --     Most recent vital signs: Vitals:   08/14/22 1953 08/14/22 2028  BP: (!) 104/52 110/71  Pulse: 96 93  Resp: 17 18  Temp: 98.5 F (36.9 C) 98.3 F (36.8 C)  SpO2: 100% 100%    Physical Exam Vitals and nursing note reviewed.  Constitutional:      General: Awake and alert. No acute distress.    Appearance: Normal appearance. The patient  is normal weight.  HENT:     Head: Normocephalic and atraumatic.     Mouth: Mucous membranes are moist.  Eyes:     General: PERRL. Normal EOMs        Right eye: No discharge.        Left eye: No discharge.     Conjunctiva/sclera: Conjunctivae normal.  Cardiovascular:     Rate and Rhythm: Tachycardic rate and regular rhythm.     Pulses: Normal pulses.     Heart sounds: Normal heart sounds Pulmonary:     Effort: Pulmonary effort is normal. No respiratory distress.     Breath sounds: Normal breath sounds.  Abdominal:     Abdomen is soft. There is minimal suprapubic abdominal tenderness. No rebound or guarding. No distention. Declined pelvic exam Musculoskeletal:        General: No swelling. Normal range of motion.     Cervical back: Normal range of motion and neck supple.  Skin:    General: Skin is warm and dry.     Capillary Refill: Capillary refill takes less than 2 seconds.     Findings: No rash.  Neurological:     Mental Status:  The patient is awake and alert.      ED Results / Procedures / Treatments   Labs (all labs ordered are listed, but only abnormal results are displayed) Labs Reviewed  CBC - Abnormal; Notable for the following components:      Result Value   RBC 3.22 (*)    Hemoglobin 8.1 (*)    HCT 27.0 (*)    MCH 25.2 (*)    RDW 20.1 (*)    nRBC 0.3 (*)    All other components within normal limits  COMPREHENSIVE METABOLIC PANEL - Abnormal; Notable for the following components:   Glucose, Bld 127 (*)    All other components within normal limits  URINALYSIS, ROUTINE W REFLEX MICROSCOPIC - Abnormal; Notable for the following components:   Color, Urine RED (*)    APPearance TURBID (*)    Glucose, UA   (*)    Value: TEST NOT REPORTED DUE TO COLOR INTERFERENCE OF URINE PIGMENT   Hgb urine dipstick   (*)    Value: TEST NOT REPORTED DUE TO COLOR INTERFERENCE OF URINE PIGMENT   Bilirubin Urine   (*)    Value: TEST NOT REPORTED DUE TO COLOR INTERFERENCE OF URINE PIGMENT   Ketones, ur   (*)    Value: TEST NOT REPORTED DUE TO COLOR INTERFERENCE OF URINE PIGMENT   Protein, ur   (*)    Value: TEST NOT REPORTED DUE TO COLOR INTERFERENCE OF URINE PIGMENT   Nitrite   (*)    Value: TEST NOT REPORTED DUE TO COLOR INTERFERENCE OF URINE PIGMENT   Leukocytes,Ua   (*)    Value: TEST NOT REPORTED DUE TO COLOR INTERFERENCE OF URINE PIGMENT   RBC / HPF >50 (*)    WBC, UA >50 (*)    All other components within normal limits  CBC WITH DIFFERENTIAL/PLATELET - Abnormal; Notable for the following components:   WBC 11.2 (*)    RBC 3.31 (*)    Hemoglobin 8.1 (*)    HCT 27.0 (*)    MCH 24.5 (*)    RDW 20.1 (*)    nRBC 0.3 (*)    Neutro Abs 8.5 (*)    All other components within normal limits  PROTIME-INR  PROTIME-INR  CBC WITH DIFFERENTIAL/PLATELET  POC URINE PREG, ED  TYPE AND SCREEN  TYPE AND SCREEN  TYPE AND SCREEN  PREPARE RBC (CROSSMATCH)     EKG     RADIOLOGY I independently reviewed and  interpreted imaging and agree with radiologists findings.     PROCEDURES:  Critical Care performed:   Procedures   MEDICATIONS ORDERED IN ED: Medications  0.9 %  sodium chloride infusion (has no administration in time range)     IMPRESSION / MDM / ASSESSMENT AND PLAN / ED COURSE  I reviewed the triage vital signs and the nursing notes.   Differential diagnosis includes, but is not limited to, menorrhagia, fibroids, endometrial thickening, anemia.  Patient is tachycardic on arrival, though normotensive.  Patient had blood work on 08/05/2022.  In the past 9 days her H&H has gone from 34/10.5 to 27/8.1.  Her tachycardia resolved without intervention.  Ultrasound reveals small mural fibroid within endometrium thickness measurement of 10 mm.  I repeated her CBC 5 hours after the initial draw and H&H remained stable.  Her tachycardia has also improved.    I discussed her presentation with OB/GYN on-call who recommends restarting her Provera and have her call the clinic tomorrow for follow-up to discuss further options.  Patient agrees with this plan.  OB/GYN also agrees with blood transfusion for symptomatic anemia. I discussed the options as well as the risks and benefits of transfusion given that she is symptomatic.  Patient would like to proceed with transfusion.  She understands the risks and benefits and signed the consent form.  Patient feel significantly improved after the blood transfusion.  Her blood pressure has normalized.  She is ambulatory around the emergency department without feeling dizzy, and without palpitations.  She understands to call OB/GYN tomorrow, and agrees to start the Provera.  She did request a prescription for "pain patches" for her low back which was prescribed.  We discussed strict return precautions and the importance of close outpatient follow-up.  Patient understands and agrees with plan.  She was discharged in stable condition.  Patient was discussed with  Dr. Jacelyn Grip who agrees with assessment and plan.  Patient's presentation is most consistent with acute complicated illness / injury requiring diagnostic workup.   Clinical Course as of 08/14/22 2047  Nancy Fetter Aug 14, 2022  1626 Discussed with OB/GYN on-call, Gigi Gin who recommends follow-up tomorrow in clinic and restarting the Provera.  Also agrees with transfusing 2 units for symptomatic anemia [JP]    Clinical Course User Index [JP] Lorraine Cimmino, Clarnce Flock, PA-C     FINAL CLINICAL IMPRESSION(S) / ED DIAGNOSES   Final diagnoses:  Episode of heavy vaginal bleeding  Symptomatic anemia     Rx / DC Orders   ED Discharge Orders          Ordered    medroxyPROGESTERone (PROVERA) 10 MG tablet  Daily        08/14/22 1704    lidocaine (LIDODERM) 5 %  Every 12 hours        08/14/22 2037             Note:  This document was prepared using Dragon voice recognition software and may include unintentional dictation errors.   Emeline Gins 08/14/22 2047    Lucillie Garfinkel, MD 08/16/22 0030

## 2022-08-14 NOTE — Discharge Instructions (Signed)
Please restart your Provera.  Please call the OB/GYN office tomorrow to arrange follow-up with Dr. Amalia Hailey.  They are expecting your call.  You were transfused blood given your anemia.  Please return for any new, worsening, or change in symptoms or other concerns.  It was a pleasure caring for you today.

## 2022-08-14 NOTE — ED Triage Notes (Signed)
Patient reports severe menstrual cramps radiating to lower back for 3 days. Patient report going through "3 packs of pads in 3 days."

## 2022-08-14 NOTE — ED Notes (Signed)
While starting blood administration. Peripheral IV in Right AC failed. The writer placed IV in pt's Left AC and blood was started at 1755.

## 2022-08-14 NOTE — ED Notes (Signed)
3 yof with a c/c of lower abdominal and back pain for the last three days. The pt reports she has had 2 weeks of continuous bleeding and very heavy flow which is not her baseline.

## 2022-08-15 LAB — BPAM RBC
Blood Product Expiration Date: 202310242359
ISSUE DATE / TIME: 202309171720
Unit Type and Rh: 5100

## 2022-08-15 LAB — TYPE AND SCREEN
ABO/RH(D): O POS
Antibody Screen: NEGATIVE
Unit division: 0

## 2022-08-16 ENCOUNTER — Encounter: Payer: Self-pay | Admitting: Obstetrics and Gynecology

## 2022-08-16 ENCOUNTER — Ambulatory Visit (INDEPENDENT_AMBULATORY_CARE_PROVIDER_SITE_OTHER): Payer: Medicaid Other | Admitting: Obstetrics and Gynecology

## 2022-08-16 VITALS — BP 115/77 | HR 88 | Ht 69.0 in | Wt 268.2 lb

## 2022-08-16 DIAGNOSIS — R102 Pelvic and perineal pain: Secondary | ICD-10-CM | POA: Diagnosis not present

## 2022-08-16 DIAGNOSIS — N939 Abnormal uterine and vaginal bleeding, unspecified: Secondary | ICD-10-CM

## 2022-08-16 MED ORDER — NORETHINDRONE ACETATE 5 MG PO TABS: 5.0000 mg | ORAL_TABLET | Freq: Three times a day (TID) | ORAL | 0 refills | Status: DC

## 2022-08-16 NOTE — Progress Notes (Signed)
HPI:      [name] is a 46 y.o. [phone] who LMP was Patient's last menstrual period was 08/03/2022.  Subjective:   She presents today as a follow-up from the emergency department.  She presented there with pelvic pain and heavy vaginal bleeding.  She was found to be anemic and given a blood transfusion.  Over the last year patient has had irregular cycles sometimes very heavy.  She has been receiving iron infusions for her chronic/acute blood loss anemia. In addition she had significant COVID and received a tracheostomy. She is known to have a uterine fibroid Approximately 1 year ago she was having heavy vaginal bleeding and underwent endometrial biopsy which proved to be negative for hyperplasia or malignancy.    Hx: The following portions of the patient's history were reviewed and updated as appropriate:             She  has a past medical history of Anemia, Cervical cancer ([place]), and Thyroid disease. She does not have any pertinent problems on file. She  has a past surgical history that includes Leg Surgery; Cholecystectomy; Tracheostomy tube placement (N/A, 09/04/2020); Cervical biopsy w/ loop electrode excision; Cesarean section; and Cardiac catheterization. Her family history includes Heart disease in her father; Hyperlipidemia in her mother; Hypertension in her mother; Liver cancer in her father. She  reports that she quit smoking about 23 years ago. Her smoking use included cigarettes. She has a 2.50 pack-year smoking history. She has never used smokeless tobacco. She reports that she does not drink alcohol and does not use drugs. She has a current medication list which includes the following prescription(s): acetaminophen, albuterol, ascorbic acid, budesonide, furosemide, hydroxyzine, iron polysaccharides, levothyroxine, lidocaine, medroxyprogesterone, nystatin cream, pantoprazole, and propranolol. She has No Known Allergies.       Review of Systems:  Review of  Systems  Constitutional: Denied constitutional symptoms, night sweats, recent illness, fatigue, fever, insomnia and weight loss.  Eyes: Denied eye symptoms, eye pain, photophobia, vision change and visual disturbance.  Ears/Nose/Throat/Neck: Denied ear, nose, throat or neck symptoms, hearing loss, nasal discharge, sinus congestion and sore throat.  Cardiovascular: Denied cardiovascular symptoms, arrhythmia, chest pain/pressure, edema, exercise intolerance, orthopnea and palpitations.  Respiratory: Denied pulmonary symptoms, asthma, pleuritic pain, productive sputum, cough, dyspnea and wheezing.  Gastrointestinal: Denied, gastro-esophageal reflux, melena, nausea and vomiting.  Genitourinary: See HPI for additional information.  Musculoskeletal: Denied musculoskeletal symptoms, stiffness, swelling, muscle weakness and myalgia.  Dermatologic: Denied dermatology symptoms, rash and scar.  Neurologic: Denied neurology symptoms, dizziness, headache, neck pain and syncope.  Psychiatric: Denied psychiatric symptoms, anxiety and depression.  Endocrine: Denied endocrine symptoms including hot flashes and night sweats.   Meds:   Current Outpatient Medications on File Prior to Visit  Medication Sig Dispense Refill   acetaminophen (TYLENOL) 500 MG tablet Take 500 mg by mouth every 6 (six) hours as needed for headache, fever, moderate pain or mild pain.     albuterol (PROVENTIL) (2.5 MG/3ML) 0.083% nebulizer solution Take 3 mLs (2.5 mg total) by nebulization every 6 (six) hours as needed for wheezing or shortness of breath. 360 mL 11   ascorbic acid (VITAMIN C) 500 MG tablet Take 1 tablet (500 mg total) by mouth daily. 30 tablet 0   budesonide (PULMICORT) 0.5 MG/2ML nebulizer solution Take 2 mLs (0.5 mg total) by nebulization 2 (two) times daily. 120 mL 5   furosemide (LASIX) 20 MG tablet Take 1 tablet (20 mg total) by mouth daily as needed for  edema (shortness of breath). 90 tablet 3   hydrOXYzine (ATARAX)  10 MG tablet Take 10 mg by mouth 3 (three) times daily.     iron polysaccharides (NIFEREX) 150 MG capsule Take 1 capsule (150 mg total) by mouth daily. 30 capsule 1   levothyroxine (SYNTHROID) 75 MCG tablet Take 3 tablets (225 mcg total) by mouth daily at 6 (six) AM. 30 tablet 2   lidocaine (LIDODERM) 5 % Place 1 patch onto the skin every 12 (twelve) hours for 5 days. Remove & Discard patch within 12 hours or as directed by MD 10 patch 0   medroxyPROGESTERone (PROVERA) 10 MG tablet Take 1 tablet (10 mg total) by mouth daily for 5 days. 5 tablet 0   nystatin cream (MYCOSTATIN) Apply topically 2 (two) times daily. 30 g 0   pantoprazole (PROTONIX) 40 MG tablet Take 1 tablet (40 mg total) by mouth daily. 30 tablet 2   propranolol (INDERAL) 10 MG tablet Take 1 tablet (10 mg total) by mouth 3 (three) times daily as needed (palpitations). 90 tablet 6   No current facility-administered medications on file prior to visit.      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 Abnormal vaginal  bleeding   2. Pelvic pain     I think it very likely that her heavy and irregular bleeding is secondary to her uterine fibroid as hyperplasia and malignancy has been ruled out.  I do not think it reasonable that she could have developed endometrial cancer in less than 1 year.  She could have adenomyosis but I have no other reason to suspect this at this time.   Plan:            1.  Strongly recommend stopping her bleeding acutely using progesterone.  2.  Recommend placement of IUD to control bleeding in the future.  3.  Continue iron infusions until her blood count normalizes when she is not bleeding so heavily. 4.  If patient should fail IUD she is not interested in fibroid embolization.  She is also not a candidate for endometrial ablation because of her history of tubal ligation.  Orders No orders of the defined types were placed in this encounter.   No orders of the defined types were placed in this encounter.     F/U  No follow-ups on file. I spent 23 minutes involved in the care of this patient  preparing to see the patient by obtaining and reviewing her medical history (including labs, imaging tests and prior procedures), documenting clinical information in the electronic health record (EHR), counseling and coordinating care plans, writing and sending prescriptions, ordering tests or procedures and in direct communicating with the patient and medical staff discussing pertinent items from her history and physical exam.  Finis Bud, M.D. 08/16/2022 11:24 AM

## 2022-08-16 NOTE — Progress Notes (Signed)
Patient presents today for an ED follow-up for abnormal bleeding. She states she is going on 3 weeks of vaginal bleeding which started light and is now heavy. Patient also complains of pelvic and back pain, more so on the right side. She reports passing large blood clots. She has been taking provera for 2 days. No other concerns at this time.

## 2022-08-18 ENCOUNTER — Inpatient Hospital Stay: Payer: Medicaid Other

## 2022-08-18 VITALS — BP 122/72 | HR 88 | Temp 97.7°F | Resp 20

## 2022-08-18 DIAGNOSIS — D509 Iron deficiency anemia, unspecified: Secondary | ICD-10-CM | POA: Diagnosis not present

## 2022-08-18 DIAGNOSIS — E611 Iron deficiency: Secondary | ICD-10-CM

## 2022-08-18 MED ORDER — SODIUM CHLORIDE 0.9 % IV SOLN
Freq: Once | INTRAVENOUS | Status: AC
  Filled 2022-08-18: qty 250

## 2022-08-18 MED ORDER — SODIUM CHLORIDE 0.9 % IV SOLN
200.0000 mg | Freq: Once | INTRAVENOUS | Status: AC
  Administered 2022-08-18: 200 mg via INTRAVENOUS
  Filled 2022-08-18: qty 200

## 2022-08-25 ENCOUNTER — Inpatient Hospital Stay: Payer: Medicaid Other

## 2022-08-25 VITALS — BP 110/78 | HR 80

## 2022-08-25 DIAGNOSIS — D509 Iron deficiency anemia, unspecified: Secondary | ICD-10-CM | POA: Diagnosis not present

## 2022-08-25 DIAGNOSIS — E611 Iron deficiency: Secondary | ICD-10-CM

## 2022-08-25 MED ORDER — SODIUM CHLORIDE 0.9 % IV SOLN
200.0000 mg | Freq: Once | INTRAVENOUS | Status: AC
  Administered 2022-08-25: 200 mg via INTRAVENOUS
  Filled 2022-08-25: qty 200

## 2022-08-25 MED ORDER — SODIUM CHLORIDE 0.9 % IV SOLN
Freq: Once | INTRAVENOUS | Status: AC
  Filled 2022-08-25: qty 250

## 2022-08-25 NOTE — Patient Instructions (Signed)

## 2022-08-31 ENCOUNTER — Encounter: Payer: Self-pay | Admitting: Obstetrics and Gynecology

## 2022-08-31 ENCOUNTER — Ambulatory Visit (INDEPENDENT_AMBULATORY_CARE_PROVIDER_SITE_OTHER): Payer: Medicaid Other | Admitting: Obstetrics and Gynecology

## 2022-08-31 VITALS — BP 125/80 | HR 80 | Ht 69.0 in | Wt 270.8 lb

## 2022-08-31 DIAGNOSIS — Z30430 Encounter for insertion of intrauterine contraceptive device: Secondary | ICD-10-CM | POA: Diagnosis not present

## 2022-08-31 DIAGNOSIS — N926 Irregular menstruation, unspecified: Secondary | ICD-10-CM

## 2022-08-31 NOTE — Progress Notes (Signed)
Patient presents today for IUD insertion due to heavy and irregular cycles. She states no other questions or concerns.

## 2022-08-31 NOTE — Progress Notes (Signed)
HPI:      [name] is a 46 y.o. [phone] who LMP was Patient's last menstrual period was 08/03/2022.  Subjective:   She presents today for IUD insertion.  She is getting the IUD for heavy irregular menses.  She has a uterine fibroid intramural. After the procedure failed we discussed her history and she states that she had "cervical cancer" and had a conization on her cervix.    Hx: The following portions of the patient's history were reviewed and updated as appropriate:             She  has a past medical history of Anemia, Cervical cancer ([place]), and Thyroid disease. She does not have any pertinent problems on file. She  has a past surgical history that includes Leg Surgery; Cholecystectomy; Tracheostomy tube placement (N/A, 09/04/2020); Cervical biopsy w/ loop electrode excision; Cesarean section; and Cardiac catheterization. Her family history includes Heart disease in her father; Hyperlipidemia in her mother; Hypertension in her mother; Liver cancer in her father. She  reports that she quit smoking about 23 years ago. Her smoking use included cigarettes. She has a 2.50 pack-year smoking history. She has never used smokeless tobacco. She reports that she does not drink alcohol and does not use drugs. She has a current medication list which includes the following prescription(s): acetaminophen, albuterol, ascorbic acid, budesonide, furosemide, hydroxyzine, iron polysaccharides, levothyroxine, norethindrone, nystatin cream, pantoprazole, and propranolol. She has No Known Allergies.       Review of Systems:  Review of Systems  Constitutional: Denied constitutional symptoms, night sweats, recent illness, fatigue, fever, insomnia and weight loss.  Eyes: Denied eye symptoms, eye pain, photophobia, vision change and visual disturbance.  Ears/Nose/Throat/Neck: Denied ear, nose, throat or neck symptoms, hearing loss, nasal discharge, sinus congestion and sore throat.  Cardiovascular: Denied  cardiovascular symptoms, arrhythmia, chest pain/pressure, edema, exercise intolerance, orthopnea and palpitations.  Respiratory: Denied pulmonary symptoms, asthma, pleuritic pain, productive sputum, cough, dyspnea and wheezing.  Gastrointestinal: Denied, gastro-esophageal reflux, melena, nausea and vomiting.  Genitourinary: Denied genitourinary symptoms including symptomatic vaginal discharge, pelvic relaxation issues, and urinary complaints.  Musculoskeletal: Denied musculoskeletal symptoms, stiffness, swelling, muscle weakness and myalgia.  Dermatologic: Denied dermatology symptoms, rash and scar.  Neurologic: Denied neurology symptoms, dizziness, headache, neck pain and syncope.  Psychiatric: Denied psychiatric symptoms, anxiety and depression.  Endocrine: Denied endocrine symptoms including hot flashes and night sweats.   Meds:   Current Outpatient Medications on File Prior to Visit  Medication Sig Dispense Refill   acetaminophen (TYLENOL) 500 MG tablet Take 500 mg by mouth every 6 (six) hours as needed for headache, fever, moderate pain or mild pain.     albuterol (PROVENTIL) (2.5 MG/3ML) 0.083% nebulizer solution Take 3 mLs (2.5 mg total) by nebulization every 6 (six) hours as needed for wheezing or shortness of breath. 360 mL 11   ascorbic acid (VITAMIN C) 500 MG tablet Take 1 tablet (500 mg total) by mouth daily. 30 tablet 0   budesonide (PULMICORT) 0.5 MG/2ML nebulizer solution Take 2 mLs (0.5 mg total) by nebulization 2 (two) times daily. 120 mL 5   furosemide (LASIX) 20 MG tablet Take 1 tablet (20 mg total) by mouth daily as needed for edema (shortness of breath). 90 tablet 3   hydrOXYzine (ATARAX) 10 MG tablet Take 10 mg by mouth 3 (three) times daily.     iron polysaccharides (NIFEREX) 150 MG capsule Take 1 capsule (150 mg total) by mouth daily. 30 capsule 1  levothyroxine (SYNTHROID) 75 MCG tablet Take 3 tablets (225 mcg total) by mouth daily at 6 (six) AM. 30 tablet 2    norethindrone (AYGESTIN) 5 MG tablet Take 1 tablet (5 mg total) by mouth in the morning, at noon, and at bedtime. 50 tablet 0   nystatin cream (MYCOSTATIN) Apply topically 2 (two) times daily. 30 g 0   pantoprazole (PROTONIX) 40 MG tablet Take 1 tablet (40 mg total) by mouth daily. 30 tablet 2   propranolol (INDERAL) 10 MG tablet Take 1 tablet (10 mg total) by mouth 3 (three) times daily as needed (palpitations). 90 tablet 6   No current facility-administered medications on file prior to visit.    Objective:     Vitals:   08/31/22 1053  BP: 125/80  Pulse: 80    Physical examination   Pelvic:   Vulva: Normal appearance.  No lesions.  Vagina: No lesions or abnormalities noted.  Support: Normal pelvic support.  Urethra No masses tenderness or scarring.  Meatus Normal size without lesions or prolapse.  Cervix: Obviously status post procedure and flush with the vaginal vault.  Anus: Normal exam.  No lesions.  Perineum: Normal exam.  No lesions.   IUD Procedure Pt has read the booklet and signed the appropriate forms regarding the Mirena IUD.  All of her questions have been answered.   Using the largest speculum that we have it was noted that her cervix was difficult-next to impossible to identify as it was flush with the vaginal vault extremely high in the pelvis.  The os could not be identified with any confidence.  The cervix is also noted to be severely anterior.  Because of the difficulty in identifying the os, the nature of the appearance of the cervix and the inability to even get instruments long enough to reach her cervix we abandoned the procedure.             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 Encounter for IUD insertion   2. Irregular menstrual cycle     Previous surgical surgery making IUD impossible to insert  Plan:          I have spoken with her in some detail regarding other options including systematic progesterone use as well as operative insertion of IUD.  Patient has chosen operative insertion of IUD.  This started in the OR we can get better exposure identified the cervical os and dilated without causing pain to the patient and thus allow [place] to insert the IUD appropriately.  She plans to return for preop in 1 to 2 weeks.    I spent 31 minutes involved in the care of this patient preparing to see the patient by obtaining and reviewing her  medical history (including labs, imaging tests and prior procedures), documenting clinical information in the electronic health record (EHR), counseling and coordinating care plans, writing and sending prescriptions, ordering tests or procedures and in direct communicating with the patient and medical staff discussing pertinent items from her history and physical exam.  Finis Bud, M.D. 08/31/2022 11:29 AM

## 2022-09-21 ENCOUNTER — Ambulatory Visit (INDEPENDENT_AMBULATORY_CARE_PROVIDER_SITE_OTHER): Payer: Medicaid Other | Admitting: Obstetrics and Gynecology

## 2022-09-21 ENCOUNTER — Encounter: Payer: Self-pay | Admitting: Obstetrics and Gynecology

## 2022-09-21 VITALS — BP 118/79 | HR 62 | Ht 69.0 in | Wt 266.4 lb

## 2022-09-21 DIAGNOSIS — Z01818 Encounter for other preprocedural examination: Secondary | ICD-10-CM

## 2022-09-21 NOTE — Progress Notes (Signed)
Patient presents today for a pre-op exam prior to IUD insertion. She states no additional concerns today.

## 2022-09-21 NOTE — H&P (Signed)
PRE-OPERATIVE HISTORY AND PHYSICAL EXAM  PCP:  [facility] [place] Subjective:   HPI:  Kathy Vega is a 46 y.o. [phone].  Patient's last menstrual period was 09/02/2022 (approximate).  She presents today for a pre-op discussion and PE.  She has the following symptoms: Patient had previous LEEP and a very difficult pelvic examination.  She desires Mirena IUD.  Review of Systems:   Constitutional: Denied constitutional symptoms, night sweats, recent illness, fatigue, fever, insomnia and weight loss.  Eyes: Denied eye symptoms, eye pain, photophobia, vision change and visual disturbance.  Ears/Nose/Throat/Neck: Denied ear, nose, throat or neck symptoms, hearing loss, nasal discharge, sinus congestion and sore throat.  Cardiovascular: Denied cardiovascular symptoms, arrhythmia, chest pain/pressure, edema, exercise intolerance, orthopnea and palpitations.  Respiratory: Denied pulmonary symptoms, asthma, pleuritic pain, productive sputum, cough, dyspnea and wheezing.  Gastrointestinal: Denied, gastro-esophageal reflux, melena, nausea and vomiting.  Genitourinary: Denied genitourinary symptoms including symptomatic vaginal discharge, pelvic relaxation issues, and urinary complaints.  Musculoskeletal: Denied musculoskeletal symptoms, stiffness, swelling, muscle weakness and myalgia.  Dermatologic: Denied dermatology symptoms, rash and scar.  Neurologic: Denied neurology symptoms, dizziness, headache, neck pain and syncope.  Psychiatric: Denied psychiatric symptoms, anxiety and depression.  Endocrine: Denied endocrine symptoms including hot flashes and night sweats.   OB History  Gravida Para Term Preterm AB Living  '3 3 3     3  '$ SAB IAB Ectopic Multiple Live Births          3    # Outcome Date GA Lbr Len/2nd Weight Sex Delivery Anes PTL Lv  3 Term 2006   8 lb 3.2 oz (3.719 kg) M CS-Unspec   LIV  2 Term 2003   8 lb (3.629 kg) M Vag-Spont   LIV  1 Term 1998   8 lb  14.4 oz (4.037 kg) F Vag-Spont   LIV    Past Medical History:  Diagnosis Date   Anemia    Cervical cancer ([place])    Thyroid disease     Past Surgical History:  Procedure Laterality Date   CARDIAC CATHETERIZATION     CERVICAL BIOPSY  W/ LOOP ELECTRODE EXCISION     CESAREAN SECTION     CHOLECYSTECTOMY     LEG SURGERY     TRACHEOSTOMY TUBE PLACEMENT N/A 09/04/2020   Procedure: TRACHEOSTOMY;  Surgeon: Beverly Gust, MD;  Location: [facility] ORS;  Service: ENT;  Laterality: N/A;      SOCIAL HISTORY:  Social History   Tobacco Use  Smoking Status Former   Packs/day: 0.50   Years: 5.00   Total pack years: 2.50   Types: Cigarettes   Quit date: 2000   Years since quitting: 23.8  Smokeless Tobacco Never   Social History   Substance and Sexual Activity  Alcohol Use No    Social History   Substance and Sexual Activity  Drug Use Never    Family History  Problem Relation Age of Onset   Hyperlipidemia Mother    Hypertension Mother    Heart disease Father    Liver cancer Father    Breast cancer Neg Hx    Ovarian cancer Neg Hx     ALLERGIES:  Patient has no known allergies.  MEDS:   Current Outpatient Medications on File Prior to Visit  Medication Sig Dispense Refill   acetaminophen (TYLENOL) 500 MG tablet Take 500 mg by mouth every 6 (six) hours as needed for headache, fever, moderate pain or mild pain.  albuterol (PROVENTIL) (2.5 MG/3ML) 0.083% nebulizer solution Take 3 mLs (2.5 mg total) by nebulization every 6 (six) hours as needed for wheezing or shortness of breath. 360 mL 11   ascorbic acid (VITAMIN C) 500 MG tablet Take 1 tablet (500 mg total) by mouth daily. 30 tablet 0   budesonide (PULMICORT) 0.5 MG/2ML nebulizer solution Take 2 mLs (0.5 mg total) by nebulization 2 (two) times daily. 120 mL 5   furosemide (LASIX) 20 MG tablet Take 1 tablet (20 mg total) by mouth daily as needed for edema (shortness of breath). 90 tablet 3   hydrOXYzine (ATARAX) 10 MG  tablet Take 10 mg by mouth 3 (three) times daily.     iron polysaccharides (NIFEREX) 150 MG capsule Take 1 capsule (150 mg total) by mouth daily. 30 capsule 1   levothyroxine (SYNTHROID) 75 MCG tablet Take 3 tablets (225 mcg total) by mouth daily at 6 (six) AM. 30 tablet 2   norethindrone (AYGESTIN) 5 MG tablet Take 1 tablet (5 mg total) by mouth in the morning, at noon, and at bedtime. 50 tablet 0   nystatin cream (MYCOSTATIN) Apply topically 2 (two) times daily. 30 g 0   pantoprazole (PROTONIX) 40 MG tablet Take 1 tablet (40 mg total) by mouth daily. 30 tablet 2   propranolol (INDERAL) 10 MG tablet Take 1 tablet (10 mg total) by mouth 3 (three) times daily as needed (palpitations). 90 tablet 6   No current facility-administered medications on file prior to visit.    No orders of the defined types were placed in this encounter.    Physical examination BP 118/79   Pulse 62   Ht '5\' 9"'$  (1.753 m)   Wt 266 lb 6.4 oz (120.8 kg)   LMP 09/02/2022 (Approximate)   BMI 39.34 kg/m   General NAD, Conversant  HEENT Atraumatic; Op clear with mmm.  Normo-cephalic. Pupils reactive. Anicteric sclerae  Thyroid/Neck Smooth without nodularity or enlargement. Normal ROM.  Neck Supple.  Skin No rashes, lesions or ulceration. Normal palpated skin turgor. No nodularity.  Breasts: No masses or discharge.  Symmetric.  No axillary adenopathy.  Lungs: Clear to auscultation.No rales or wheezes. Normal Respiratory effort, no retractions.  Heart: NSR.  No murmurs or rubs appreciated. No periferal edema  Abdomen: Soft.  Non-tender.  No masses.  No HSM. No hernia  Extremities: Moves all appropriately.  Normal ROM for age. No lymphadenopathy.  Neuro: Oriented to PPT.  Normal mood. Normal affect.     Pelvic:   Vulva: Normal appearance.  No lesions.  Vagina: Long difficult exam vagina  Support: Normal pelvic support.  Urethra No masses tenderness or scarring.  Meatus Normal size without lesions or prolapse.   Cervix: Cervix flush with vaginal vault  Anus: Normal exam.  No lesions.  Perineum: Normal exam.  No lesions.        Bimanual   Uterus: Normal size.  Non-tender.  Mobile.  AV.  Adnexae: No masses.  Non-tender to palpation.  Cul-de-sac: Negative for abnormality.   Assessment:   G3P3003 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   Intubation of airway performed without difficulty    Pain    Tracheostomy tube dependence with subglottic stenosis with signs of  reflux on exam(HCC) 09/18/2020   Acute respiratory failure ([place])    Pneumonia due to COVID-19 virus 08/13/2020   Acute renal failure (ARF) ([place]) 08/13/2020   Elevated liver enzymes 08/13/2020   CAP (community acquired pneumonia) 08/13/2020   Acute encephalopathy 08/13/2020   Hypokalemia 08/13/2020    1. Pre-op exam      Plan:   Orders: No orders of the defined types were placed in this encounter.    1.  Dilation of the cervix and placement of Mirena IUD.

## 2022-09-21 NOTE — Progress Notes (Signed)
PRE-OPERATIVE HISTORY AND PHYSICAL EXAM  PCP:  [facility] [place] Subjective:   HPI:  Kathy Vega is a 46 y.o. [phone].  Patient's last menstrual period was 09/02/2022 (approximate).  She presents today for a pre-op discussion and PE.  She has the following symptoms: Patient had previous LEEP and a very difficult pelvic examination.  She desires Mirena IUD.  Review of Systems:   Constitutional: Denied constitutional symptoms, night sweats, recent illness, fatigue, fever, insomnia and weight loss.  Eyes: Denied eye symptoms, eye pain, photophobia, vision change and visual disturbance.  Ears/Nose/Throat/Neck: Denied ear, nose, throat or neck symptoms, hearing loss, nasal discharge, sinus congestion and sore throat.  Cardiovascular: Denied cardiovascular symptoms, arrhythmia, chest pain/pressure, edema, exercise intolerance, orthopnea and palpitations.  Respiratory: Denied pulmonary symptoms, asthma, pleuritic pain, productive sputum, cough, dyspnea and wheezing.  Gastrointestinal: Denied, gastro-esophageal reflux, melena, nausea and vomiting.  Genitourinary: Denied genitourinary symptoms including symptomatic vaginal discharge, pelvic relaxation issues, and urinary complaints.  Musculoskeletal: Denied musculoskeletal symptoms, stiffness, swelling, muscle weakness and myalgia.  Dermatologic: Denied dermatology symptoms, rash and scar.  Neurologic: Denied neurology symptoms, dizziness, headache, neck pain and syncope.  Psychiatric: Denied psychiatric symptoms, anxiety and depression.  Endocrine: Denied endocrine symptoms including hot flashes and night sweats.   OB History  Gravida Para Term Preterm AB Living  '3 3 3     3  '$ SAB IAB Ectopic Multiple Live Births          3    # Outcome Date GA Lbr Len/2nd Weight Sex Delivery Anes PTL Lv  3 Term 2006   8 lb 3.2 oz (3.719 kg) M CS-Unspec   LIV  2 Term 2003   8 lb (3.629 kg) M Vag-Spont   LIV  1 Term 1998   8 lb  14.4 oz (4.037 kg) F Vag-Spont   LIV    Past Medical History:  Diagnosis Date   Anemia    Cervical cancer ([place])    Thyroid disease     Past Surgical History:  Procedure Laterality Date   CARDIAC CATHETERIZATION     CERVICAL BIOPSY  W/ LOOP ELECTRODE EXCISION     CESAREAN SECTION     CHOLECYSTECTOMY     LEG SURGERY     TRACHEOSTOMY TUBE PLACEMENT N/A 09/04/2020   Procedure: TRACHEOSTOMY;  Surgeon: Beverly Gust, MD;  Location: [facility] ORS;  Service: ENT;  Laterality: N/A;      SOCIAL HISTORY:  Social History   Tobacco Use  Smoking Status Former   Packs/day: 0.50   Years: 5.00   Total pack years: 2.50   Types: Cigarettes   Quit date: 2000   Years since quitting: 23.8  Smokeless Tobacco Never   Social History   Substance and Sexual Activity  Alcohol Use No    Social History   Substance and Sexual Activity  Drug Use Never    Family History  Problem Relation Age of Onset   Hyperlipidemia Mother    Hypertension Mother    Heart disease Father    Liver cancer Father    Breast cancer Neg Hx    Ovarian cancer Neg Hx     ALLERGIES:  Patient has no known allergies.  MEDS:   Current Outpatient Medications on File Prior to Visit  Medication Sig Dispense Refill   acetaminophen (TYLENOL) 500 MG tablet Take 500 mg by mouth every 6 (six) hours as needed for headache, fever, moderate pain or mild pain.  albuterol (PROVENTIL) (2.5 MG/3ML) 0.083% nebulizer solution Take 3 mLs (2.5 mg total) by nebulization every 6 (six) hours as needed for wheezing or shortness of breath. 360 mL 11   ascorbic acid (VITAMIN C) 500 MG tablet Take 1 tablet (500 mg total) by mouth daily. 30 tablet 0   budesonide (PULMICORT) 0.5 MG/2ML nebulizer solution Take 2 mLs (0.5 mg total) by nebulization 2 (two) times daily. 120 mL 5   furosemide (LASIX) 20 MG tablet Take 1 tablet (20 mg total) by mouth daily as needed for edema (shortness of breath). 90 tablet 3   hydrOXYzine (ATARAX) 10 MG  tablet Take 10 mg by mouth 3 (three) times daily.     iron polysaccharides (NIFEREX) 150 MG capsule Take 1 capsule (150 mg total) by mouth daily. 30 capsule 1   levothyroxine (SYNTHROID) 75 MCG tablet Take 3 tablets (225 mcg total) by mouth daily at 6 (six) AM. 30 tablet 2   norethindrone (AYGESTIN) 5 MG tablet Take 1 tablet (5 mg total) by mouth in the morning, at noon, and at bedtime. 50 tablet 0   nystatin cream (MYCOSTATIN) Apply topically 2 (two) times daily. 30 g 0   pantoprazole (PROTONIX) 40 MG tablet Take 1 tablet (40 mg total) by mouth daily. 30 tablet 2   propranolol (INDERAL) 10 MG tablet Take 1 tablet (10 mg total) by mouth 3 (three) times daily as needed (palpitations). 90 tablet 6   No current facility-administered medications on file prior to visit.    No orders of the defined types were placed in this encounter.    Physical examination BP 118/79   Pulse 62   Ht '5\' 9"'$  (1.753 m)   Wt 266 lb 6.4 oz (120.8 kg)   LMP 09/02/2022 (Approximate)   BMI 39.34 kg/m   General NAD, Conversant  HEENT Atraumatic; Op clear with mmm.  Normo-cephalic. Pupils reactive. Anicteric sclerae  Thyroid/Neck Smooth without nodularity or enlargement. Normal ROM.  Neck Supple.  Skin No rashes, lesions or ulceration. Normal palpated skin turgor. No nodularity.  Breasts: No masses or discharge.  Symmetric.  No axillary adenopathy.  Lungs: Clear to auscultation.No rales or wheezes. Normal Respiratory effort, no retractions.  Heart: NSR.  No murmurs or rubs appreciated. No periferal edema  Abdomen: Soft.  Non-tender.  No masses.  No HSM. No hernia  Extremities: Moves all appropriately.  Normal ROM for age. No lymphadenopathy.  Neuro: Oriented to PPT.  Normal mood. Normal affect.     Pelvic:   Vulva: Normal appearance.  No lesions.  Vagina: Long difficult exam vagina  Support: Normal pelvic support.  Urethra No masses tenderness or scarring.  Meatus Normal size without lesions or prolapse.   Cervix: Cervix flush with vaginal vault  Anus: Normal exam.  No lesions.  Perineum: Normal exam.  No lesions.        Bimanual   Uterus: Normal size.  Non-tender.  Mobile.  AV.  Adnexae: No masses.  Non-tender to palpation.  Cul-de-sac: Negative for abnormality.   Assessment:   G3P3003 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   Intubation of airway performed without difficulty    Pain    Tracheostomy tube dependence with subglottic stenosis with signs of  reflux on exam(HCC) 09/18/2020   Acute respiratory failure ([place])    Pneumonia due to COVID-19 virus 08/13/2020   Acute renal failure (ARF) ([place]) 08/13/2020   Elevated liver enzymes 08/13/2020   CAP (community acquired pneumonia) 08/13/2020   Acute encephalopathy 08/13/2020   Hypokalemia 08/13/2020    1. Pre-op exam      Plan:   Orders: No orders of the defined types were placed in this encounter.    1.  Dilation of the cervix and placement of Mirena IUD.  Pre-op discussions regarding Risks and Benefits of her scheduled surgery.  Discussed possible risks of surgery including uterine perforation because of her previous LEEP and her examination showing the cervix flush with the vaginal vault as well as increased scarring of the os.  I spent 26 minutes involved in the care of this patient preparing to see the patient by obtaining and reviewing her medical history (including labs, imaging tests and prior procedures), documenting clinical information in the electronic health record (EHR), counseling and coordinating care plans, writing and sending prescriptions, ordering tests or procedures and in direct communicating with the patient and medical staff discussing  pertinent items from her history and physical exam.  Finis Bud, M.D. 09/21/2022 10:46 AM

## 2022-09-22 ENCOUNTER — Other Ambulatory Visit: Payer: Self-pay | Admitting: Obstetrics and Gynecology

## 2022-09-22 DIAGNOSIS — N939 Abnormal uterine and vaginal bleeding, unspecified: Secondary | ICD-10-CM

## 2022-09-28 ENCOUNTER — Inpatient Hospital Stay: Payer: Medicaid Other

## 2022-09-28 ENCOUNTER — Inpatient Hospital Stay: Payer: Medicaid Other | Attending: Internal Medicine

## 2022-09-28 ENCOUNTER — Inpatient Hospital Stay (HOSPITAL_BASED_OUTPATIENT_CLINIC_OR_DEPARTMENT_OTHER): Payer: Medicaid Other | Admitting: Internal Medicine

## 2022-09-28 VITALS — BP 114/73 | HR 74 | Resp 16

## 2022-09-28 DIAGNOSIS — E611 Iron deficiency: Secondary | ICD-10-CM

## 2022-09-28 DIAGNOSIS — D509 Iron deficiency anemia, unspecified: Secondary | ICD-10-CM | POA: Insufficient documentation

## 2022-09-28 DIAGNOSIS — Z79899 Other long term (current) drug therapy: Secondary | ICD-10-CM | POA: Insufficient documentation

## 2022-09-28 LAB — CBC WITH DIFFERENTIAL/PLATELET
Abs Immature Granulocytes: 0.02 10*3/uL (ref 0.00–0.07)
Basophils Absolute: 0.1 10*3/uL (ref 0.0–0.1)
Basophils Relative: 1 %
Eosinophils Absolute: 0.1 10*3/uL (ref 0.0–0.5)
Eosinophils Relative: 1 %
HCT: 37.2 % (ref 36.0–46.0)
Hemoglobin: 11.5 g/dL — ABNORMAL LOW (ref 12.0–15.0)
Immature Granulocytes: 0 %
Lymphocytes Relative: 21 %
Lymphs Abs: 1.4 10*3/uL (ref 0.7–4.0)
MCH: 25.1 pg — ABNORMAL LOW (ref 26.0–34.0)
MCHC: 30.9 g/dL (ref 30.0–36.0)
MCV: 81 fL (ref 80.0–100.0)
Monocytes Absolute: 0.4 10*3/uL (ref 0.1–1.0)
Monocytes Relative: 6 %
Neutro Abs: 4.9 10*3/uL (ref 1.7–7.7)
Neutrophils Relative %: 71 %
Platelets: 399 10*3/uL (ref 150–400)
RBC: 4.59 MIL/uL (ref 3.87–5.11)
RDW: 16.5 % — ABNORMAL HIGH (ref 11.5–15.5)
WBC: 6.9 10*3/uL (ref 4.0–10.5)
nRBC: 0 % (ref 0.0–0.2)

## 2022-09-28 MED ORDER — SODIUM CHLORIDE 0.9 % IV SOLN
Freq: Once | INTRAVENOUS | Status: AC
  Filled 2022-09-28: qty 250

## 2022-09-28 MED ORDER — SODIUM CHLORIDE 0.9 % IV SOLN
200.0000 mg | Freq: Once | INTRAVENOUS | Status: AC
  Administered 2022-09-28: 200 mg via INTRAVENOUS
  Filled 2022-09-28: qty 200

## 2022-09-28 NOTE — Assessment & Plan Note (Addendum)
#  Chronic severe anemia- Hb- 7-8; iron saturation 2% [AUG 2023]-very symptomatic.  Likely secondary to menorrhagia. S/p Venofer- Nov 2023- Hb 11.2; continue PO iron. Proceed with venofer today.   #Etiology of iron deficiency: Likely secondary to menorrhagia. S/p Dr. Amalia Hailey gynecology Clarisse Gouge IUD- Nov 13th, 2023. Down the line consider GI referral.   # Palpitations unrelated to anemia.  Today heart rate is 90.- awaiting evaluation with PCP on 11/13.   #Respiratory failure-COVID 2021 currently s/p trach-clinically stable; awaiting surgery [MAY 2024]   # DISPOSITION: # venofer today # follow up in 4 months- MD: labs- cbc/bmp; iron studies; ferritin possible Venofer- Dr.B

## 2022-09-28 NOTE — Progress Notes (Signed)
Pt in for follow up, reports energy levels still unchanged, feels fatigued all the time. Pt reports continues to have irregular heart rate and has been taking medication prn as ordered.

## 2022-09-28 NOTE — Patient Instructions (Signed)

## 2022-09-28 NOTE — Progress Notes (Signed)
Tolerated iron infusion without complaints.  VSS.  Pt denied 30 minute post observation

## 2022-09-28 NOTE — Progress Notes (Signed)
I connected with Kathy Vega on 09/28/22 at  1:15 PM EDT by video enabled telemedicine visit and verified that I am speaking with the correct person using two identifiers.  I discussed the limitations, risks, security and privacy concerns of performing an evaluation and management service by telemedicine and the availability of in-person appointments. I also discussed with the patient that there may be a patient responsible charge related to this service. The patient expressed understanding and agreed to proceed.    Other persons participating in the visit and their role in the encounter: RN/medical reconciliation Patient's location: office Provider's location: home  Oncology History   No history exists.    Chief Complaint: Iron deficiency   History of present illness:Kathy Vega 46 y.o.  female with history of significant for Severe covid disease in 2021 requiring trach and prolonged hospitalization, now still with tracheostomy on nighttime and as needed O2 up to 4 L, but complicated with subglottal stenosis , obesity  and anemia related to menorrhagia with hemoglobin is here for a follow up.  In the interim patient received IV iron infusion.  Feels her fatigue is improved.  However notes to have intermittent palpitations.  Denies any significant chest pains.  Pt evaluated by gynecology- awaiting IUD placement.   Observation/objective: Alert & oriented x 3. In No acute distress.   Assessment and plan: Iron deficiency #Chronic severe anemia- Hb- 7-8; iron saturation 2% [AUG 2023]-very symptomatic.  Likely secondary to menorrhagia. S/p Venofer- Nov 2023- Hb 11.2; continue PO iron. Proceed with venofer today.   #Etiology of iron deficiency: Likely secondary to menorrhagia. S/p Dr. Amalia Hailey gynecology Clarisse Gouge IUD- Nov 13th, 2023. Down the line consider GI referral.   # Palpitations- awaiting evaluation with PCP on 11/13.   #Respiratory failure-COVID 2021 currently s/p trach-clinically  stable; awaiting surgery [MAY 2024]   # DISPOSITION: # venofer today # follow up in 4 months- MD: labs- cbc/bmp; iron studies; ferritin possible Venofer- Dr.B     Follow-up instructions:  I discussed the assessment and treatment plan with the patient.  The patient was provided an opportunity to ask questions and all were answered.  The patient agreed with the plan and demonstrated understanding of instructions.  The patient was advised to call back or seek an in person evaluation if the symptoms worsen or if the condition fails to improve as anticipated.    Dr. Charlaine Dalton [facility] 09/28/2022 2:00 PM

## 2022-10-03 ENCOUNTER — Encounter
Admission: RE | Admit: 2022-10-03 | Discharge: 2022-10-03 | Disposition: A | Discharge status: Home / Self Care | Payer: Medicaid Other | Source: Ambulatory Visit | Attending: Obstetrics and Gynecology | Admitting: Obstetrics and Gynecology

## 2022-10-03 VITALS — Ht 69.0 in | Wt 268.0 lb

## 2022-10-03 DIAGNOSIS — Z01812 Encounter for preprocedural laboratory examination: Secondary | ICD-10-CM

## 2022-10-03 HISTORY — DX: Other specified diseases of upper respiratory tract: J39.8

## 2022-10-03 HISTORY — DX: Tracheostomy status: Z93.0

## 2022-10-03 HISTORY — DX: Hypothyroidism, unspecified: E03.9

## 2022-10-03 HISTORY — DX: Stenosis of larynx: J38.6

## 2022-10-03 HISTORY — DX: Obesity, unspecified: E66.9

## 2022-10-03 HISTORY — DX: Iron deficiency anemia, unspecified: D50.9

## 2022-10-03 HISTORY — DX: Unspecified asthma, uncomplicated: J45.909

## 2022-10-03 NOTE — Patient Instructions (Addendum)
Your procedure is scheduled on Monday, November 13 Report to the Registration Desk on the 1st floor of the [facility]. To find out your arrival time, please call [phone] between 1PM - 3PM on: Friday, November 10 If your arrival time is 6:00 am, do not arrive prior to that time as the [facility] entrance doors do not open until 6:00 am.  REMEMBER: Instructions that are not followed completely may result in serious medical risk, up to and including death; or upon the discretion of your surgeon and anesthesiologist your surgery may need to be rescheduled.  Do not eat or drink after midnight the night before surgery.  No gum chewing, lozengers or hard candies.  TAKE THESE MEDICATIONS THE MORNING OF SURGERY WITH A SIP OF WATER:  Levothyroxine Norethindrone Pantoprazole (Protonix) - (take one the night before and one on the morning of surgery - helps to prevent nausea after surgery.)  Use the budesonide (Pulmicort) nebulizer before coming to the hospital on the day of surgery.  One week prior to surgery: starting November 6 Stop Anti-inflammatories (NSAIDS) such as Advil, Aleve, Ibuprofen, Motrin, Naproxen, Naprosyn and Aspirin based products such as Excedrin, Goodys Powder, BC Powder. Stop ANY OVER THE COUNTER supplements until after surgery. Stop vitamin C. You may however, continue to take Tylenol if needed for pain up until the day of surgery.  No Alcohol for 24 hours before or after surgery.  No Smoking including e-cigarettes for 24 hours prior to surgery.  No chewable tobacco products for at least 6 hours prior to surgery.  No nicotine patches on the day of surgery.  Do not use any "recreational" drugs for at least a week prior to your surgery.  Please be advised that the combination of cocaine and anesthesia may have negative outcomes, up to and including death. If you test positive for cocaine, your surgery will be cancelled.  On the morning of surgery brush your teeth  with toothpaste and water, you may rinse your mouth with mouthwash if you wish. Do not swallow any toothpaste or mouthwash.  Do not wear jewelry, make-up, hairpins, clips or nail polish.  Do not wear lotions, powders, or perfumes.   Do not shave body from the neck down 48 hours prior to surgery just in case you cut yourself which could leave a site for infection.   Contact lenses, hearing aids and dentures may not be worn into surgery.  Do not bring valuables to the hospital. [facility] is not responsible for any missing/lost belongings or valuables.   Notify your doctor if there is any change in your medical condition (cold, fever, infection).  Wear comfortable clothing (specific to your surgery type) to the hospital.  After surgery, you can help prevent lung complications by doing breathing exercises.  Take deep breaths and cough every 1-2 hours. Your doctor may order a device called an Incentive Spirometer to help you take deep breaths.  If you are being discharged the day of surgery, you will not be allowed to drive home. You will need a responsible adult (18 years or older) to drive you home and stay with you that night.   If you are taking public transportation, you will need to have a responsible adult (18 years or older) with you. Please confirm with your physician that it is acceptable to use public transportation.   Please call the [facility]. at [phone] if you have any questions about these instructions.  Surgery Visitation Policy:  Patients  undergoing a surgery or procedure may have two family members or support persons with them as long as the person is not COVID-19 positive or experiencing its symptoms.

## 2022-10-03 NOTE — Progress Notes (Signed)
  Perioperative Services Pre-Admission/Anesthesia Testing    Date: 10/03/22  Name: ADDALEIGH NICHOLLS MRN:   [id number]  Re: Complex patient - need for preoperative clearance  Planned Surgical Procedure(s):    Case: [id number] Date/Time: 10/10/22 1225   Procedure: DILATION OF CERVIX AND INTRAUTERINE DEVICE (IUD) (MIRENA) INSERTION   Anesthesia type: Choice   Pre-op diagnosis: IUD Insert   Location: [facility] OR ROOM 08 / [place] ORS FOR ANESTHESIA GROUP   Surgeons: Harlin Heys, MD   Clinical Notes:  Patient is scheduled for the above procedure on 10/10/2022 with Dr. Jeannie Fend, MD.  Preparation for procedure, patient underwent free surgical/anesthesia interview on 10/03/2022.  During her interview, it was noted that patient has a tracheostomy in place.    Tracheostomy was placed in September 2021.    Patient underwent [place] cannula and tracheostomy site revision in 01/2022.  Procedure revealed suprastomal narrowing measuring 6 mm x 10 mm with mild associated distal tracheal collapse. Stoma dilated.  Patient downsized to a #4 Shiley.   When last seen by ENT on 08/09/2022, patient with persistent breath stacking with PM valve in place.  ENT noted that this was suggestive of limited airflow around #4 tracheostomy tube.  Patient with subglottic stenosis based on breath stacking and results from recent CT scan. Initially, patient had plans to undergo open airway surgery later this year. With that being said, notes indicate that procedure has been postponed, per patient preference, until the spring of 2024.  Information above shared with attending anesthesiologist on call Erenest Rasher, MD).  MD made aware of planned surgical intervention, as well as plans for open airway surgery for tracheal resection.  Given the complexity of patient's overall medical condition, anesthesiologist is requesting both pulmonary medicine and cardiology clearance prior to proceeding with elective D&C here at [facility].    PAT secretary to reach out to surgeons office to make them aware of anesthesia requests so that patient can be appropriately scheduled for appointments with these specialists for preoperative assessment and clearance.   Honor Loh, MSN, APRN, FNP-C, CEN [facility]  Peri-operative Services Nurse Practitioner Phone: [phone] 10/03/22 3:32 PM  NOTE: This note has been prepared using Dragon dictation software. Despite my best ability to proofread, there is always the potential that unintentional transcriptional errors may still occur from this process.

## 2022-10-04 ENCOUNTER — Encounter: Payer: Self-pay | Admitting: Urgent Care

## 2022-10-04 ENCOUNTER — Encounter
Admission: RE | Admit: 2022-10-04 | Discharge: 2022-10-04 | Disposition: A | Discharge status: Home / Self Care | Payer: Medicaid Other | Source: Ambulatory Visit | Attending: Obstetrics and Gynecology | Admitting: Obstetrics and Gynecology

## 2022-10-04 DIAGNOSIS — Z01818 Encounter for other preprocedural examination: Secondary | ICD-10-CM

## 2022-10-04 DIAGNOSIS — Z01812 Encounter for preprocedural laboratory examination: Secondary | ICD-10-CM

## 2022-10-07 ENCOUNTER — Telehealth: Payer: Self-pay

## 2022-10-07 NOTE — Telephone Encounter (Signed)
Patient contacted office and was upset as to why her appt for surgery was cancelled on 10/10/22. Patient states that she was not notified why it was cancelled  and states that she will not continue to call hospital with no answer. After speaking with CMA Lovena Le I advised patient that a clearance would be needed for cardiology and pulmonology prior to surgery. Patient became upset on phone and stated that this was unprofessional and that she will not follow up with specialist and states that she will not have surgery. KW

## 2022-10-10 ENCOUNTER — Ambulatory Visit
Admission: RE | Admit: 2022-10-10 | Payer: Medicaid Other | Source: Home / Self Care | Admitting: Obstetrics and Gynecology

## 2022-10-10 ENCOUNTER — Ambulatory Visit: Payer: Medicaid Other | Admitting: Cardiovascular Disease

## 2022-10-10 ENCOUNTER — Encounter: Admission: RE | Payer: Self-pay | Source: Home / Self Care

## 2022-10-10 DIAGNOSIS — D62 Acute posthemorrhagic anemia: Secondary | ICD-10-CM

## 2022-10-10 DIAGNOSIS — N179 Acute kidney failure, unspecified: Secondary | ICD-10-CM

## 2022-10-10 DIAGNOSIS — E876 Hypokalemia: Secondary | ICD-10-CM

## 2022-10-10 DIAGNOSIS — D500 Iron deficiency anemia secondary to blood loss (chronic): Secondary | ICD-10-CM

## 2022-10-10 DIAGNOSIS — Z01812 Encounter for preprocedural laboratory examination: Secondary | ICD-10-CM

## 2022-10-10 SURGERY — INSERTION, INTRAUTERINE DEVICE
Anesthesia: Choice

## 2022-10-16 NOTE — Progress Notes (Unsigned)
Cardiology Clinic Note   Patient Name: Kathy Vega Date of Encounter: 10/17/2022  Primary Care Provider:  [facility] [place] Primary Cardiologist:  None  Patient Profile    46 year old female with a past medical history of COVID-19 pneumonia, ARDS, dilated cardiomyopathy, chronic shortness of breath, peripheral edema, obesity, symptomatic anemia from heavy menses, palpitations, who is here today for follow-up and needing preoperative clearance for IUD insertion.  Past Medical History   Past Medical History:  Diagnosis Date   Acute encephalopathy 08/13/2020   Acute renal failure (ARF) ([place]) 08/13/2020   Anemia    Asthma    Cervical cancer (HCC)    Hypothyroidism    Iron deficiency anemia    Obesity (BMI 30-39.9)    Pneumonia due to COVID-19 virus 08/13/2020   requiring trach and prolonged hospitalization   Subglottic stenosis    Tracheal stenosis    Tracheostomy tube present [facility])    Past Surgical History:  Procedure Laterality Date   CARDIAC CATHETERIZATION     CERVICAL BIOPSY  W/ LOOP ELECTRODE EXCISION     CESAREAN SECTION     CHOLECYSTECTOMY     LARYNGOSCOPY  04/16/2021   LARYNGOSCOPY  02/25/2022   LARYNGOSCOPY  08/13/2021   LEG SURGERY     TRACHEOSTOMY TUBE PLACEMENT N/A 09/04/2020   Procedure: TRACHEOSTOMY;  Surgeon: Beverly Gust, MD;  Location: [facility] ORS;  Service: ENT;  Laterality: N/A;   TUBAL LIGATION      Allergies  No Known Allergies  History of Present Illness    Dwayne Begay is a 46 year old female with a past medical history of COVID-19 pneumonia and ARDS requiring intubation in 07/2020.  Tracheostomy was placed on 07/07/2020.  She began tolerating trach collar O2 and short duration-12/12 for the 12/13 but tracheal aspirate culture she was growing Pseudomonas.  She also has a history of morbid obesity, chronic shortness of breath, symptomatic anemia related to heavy menses, peripheral edema, palpitations, and chronic chest  pain.  Echocardiogram completed 03/30/2022 revealed LVEF 50-55%, no wall motion abnormalities, and no valvular abnormalities.  Long-term monitoring for 04/05/2022 revealed predominantly underlying rhythm of normal sinus rhythm, minimum heart rate of 56 and maximum heart rate of 151 bpm with average heart rate of 94 bpm there were isolated SVE's that were rare and isolated VE's there were rare.  Patient triggered events were associated with normal sinus rhythm and a rare PVC.  In 06/10/2022 she underwent a venous ultrasound to the bilateral lower extremities that was negative for DVT.  She was last seen in clinic 04/06/2022 by Dr. Rockey Situ.  At that time she continued to have complaints of shortness of breath, chest discomfort, and occasional palpitations.  She was started on Lasix and 20 mg daily as needed for swelling and potassium supplements when taking diuretic therapy.  Hemoglobin was stable at that time at 10.  No other changes in testing were ordered.  She returns clinic today stating that overall she has been doing fairly well.  Her hemoglobin has remained stable at 11.5.  She states that she continues to have some shortness of breath that is unchanged and continues with steady prickly chest discomforts that she has had for ongoing of up to over a year at this point they have not changed in frequency or intensity.  She has only had to take as needed furosemide and potassium supplements sparingly and denies any edema today.  She denies any recent hospitalizations or visits to the emergency  department.  Home Medications    Current Outpatient Medications  Medication Sig Dispense Refill   acetaminophen (TYLENOL) 500 MG tablet Take 500 mg by mouth every 6 (six) hours as needed for headache, fever, moderate pain or mild pain.     albuterol (PROVENTIL) (2.5 MG/3ML) 0.083% nebulizer solution Take 3 mLs (2.5 mg total) by nebulization every 6 (six) hours as needed for wheezing or shortness of breath. 360 mL  11   ascorbic acid (VITAMIN C) 500 MG tablet Take 1 tablet (500 mg total) by mouth daily. 30 tablet 0   budesonide (PULMICORT) 0.5 MG/2ML nebulizer solution Take 2 mLs (0.5 mg total) by nebulization 2 (two) times daily. 120 mL 5   furosemide (LASIX) 20 MG tablet Take 1 tablet (20 mg total) by mouth daily as needed for edema (shortness of breath). 90 tablet 3   hydrOXYzine (ATARAX) 10 MG tablet Take 10 mg by mouth 3 (three) times daily.     iron polysaccharides (NIFEREX) 150 MG capsule Take 1 capsule (150 mg total) by mouth daily. 30 capsule 1   levothyroxine (SYNTHROID) 75 MCG tablet Take 3 tablets (225 mcg total) by mouth daily at 6 (six) AM. 30 tablet 2   norethindrone (AYGESTIN) 5 MG tablet TAKE 1 TABLET BY MOUTH EVERY MORNING, 1 TABLET EVERY DAY AT NOON, AND 1 TABLET EVERY NIGHT AT BEDTIME 50 tablet 0   nystatin cream (MYCOSTATIN) Apply topically 2 (two) times daily. 30 g 0   OXYGEN Inhale into the lungs at bedtime as needed. Up to 4 liters     pantoprazole (PROTONIX) 40 MG tablet Take 1 tablet (40 mg total) by mouth daily. 30 tablet 2   propranolol (INDERAL) 10 MG tablet Take 1 tablet (10 mg total) by mouth 3 (three) times daily as needed (palpitations). 90 tablet 6   No current facility-administered medications for this visit.     Family History    Family History  Problem Relation Age of Onset   Hyperlipidemia Mother    Hypertension Mother    Heart disease Father    Liver cancer Father    Breast cancer Neg Hx    Ovarian cancer Neg Hx    She indicated that her mother is alive. She indicated that her father is deceased. She indicated that the status of her neg hx is unknown.  Social History    Social History   Socioeconomic History   Marital status: Married    Spouse name: Mliss Fritz   Number of children: 3   Years of education: Not on file   Highest education level: Not on file  Occupational History   Not on file  Tobacco Use   Smoking status: Former    Packs/day: 0.50     Years: 5.00    Total pack years: 2.50    Types: Cigarettes    Quit date: 2000    Years since quitting: 23.9   Smokeless tobacco: Never  Vaping Use   Vaping Use: Never used  Substance and Sexual Activity   Alcohol use: No   Drug use: Never   Sexual activity: Yes    Birth control/protection: Surgical    Comment: BTL  Other Topics Concern   Not on file  Social History Narrative   Not on file   Social Determinants of Health   Financial Resource Strain: Not on file  Food Insecurity: Not on file  Transportation Needs: Not on file  Physical Activity: Not on file  Stress: Not on file  Social  Connections: Not on file  Intimate Partner Violence: Not on file     Review of Systems    General:  No chills, fever, night sweats or weight changes.  Endorses fatigue Cardiovascular: Endorses chronic chest pain, dyspnea on exertion, edema, but denies orthopnea, palpitations, paroxysmal nocturnal dyspnea. Dermatological: No rash, lesions/masses Respiratory: No cough, endorses chronic dyspnea Urologic: No hematuria, dysuria Abdominal:   No nausea, vomiting, diarrhea, bright red blood per rectum, melena, or hematemesis Neurologic:  No visual changes, wkns, changes in mental status. All other systems reviewed and are otherwise negative except as noted above.   Physical Exam    VS:  BP 120/88 (BP Location: Left Arm, Patient Position: Sitting, Cuff Size: Large)   Pulse 86   Ht '5\' 9"'$  (1.753 m)   Wt 275 lb 6.4 oz (124.9 kg)   LMP 09/02/2022 (Approximate)   SpO2 98%   BMI 40.67 kg/m  , BMI Body mass index is 40.67 kg/m.     GEN: Well nourished, well developed, in no acute distress. HEENT: normal. Neck: Supple, no JVD, carotid bruits, or masses.  Tracheostomy intact secured with commercial tube holder with Passy-Muir valve on Cardiac: RRR, no murmurs, rubs, or gallops. No clubbing, cyanosis, trace pretibial edema.  Radials/DP/PT 2+ and equal bilaterally.  Respiratory:  Respirations  regular and unlabored, clear to auscultation bilaterally. GI: Soft, nontender, nondistended, BS + x 4. MS: no deformity or atrophy. Skin: warm and dry, no rash. Neuro:  Strength and sensation are intact. Psych: Normal affect.  Accessory Clinical Findings    ECG personally reviewed by me today-sinus rhythm with a rate of 86 and a left axis deviation- No acute changes  Lab Results  Component Value Date   WBC 6.9 09/28/2022   HGB 11.5 (L) 09/28/2022   HCT 37.2 09/28/2022   MCV 81.0 09/28/2022   PLT 399 09/28/2022   Lab Results  Component Value Date   CREATININE 0.91 08/14/2022   BUN 8 08/14/2022   NA 139 08/14/2022   K 4.1 08/14/2022   CL 109 08/14/2022   CO2 23 08/14/2022   Lab Results  Component Value Date   ALT 26 08/14/2022   AST 26 08/14/2022   ALKPHOS 68 08/14/2022   BILITOT 0.5 08/14/2022   Lab Results  Component Value Date   TRIG 1,906 (H) 08/17/2020    No results found for: "HGBA1C"  Assessment & Plan   1.  Chronic shortness of breath that is unchanged with tracheostomy intact.  She has as needed furosemide for shortness of breath and edema which she has only had to take a few doses of.  Last echocardiogram was completed 03/30/2022 which revealed an LVEF of 50-55%, no regional wall motion abnormalities, and no valvular abnormalities.   she continues to be low risk for underlying ischemia.She has been encouraged to watch her fluid intake, watch her sodium intake, and to weigh daily.  2.  Chronic symptomatic anemia with a stable hemoglobin 2 weeks ago of 11.5.  She was hospitalized and required transfusion previously reported heavy menses.  She does required clearance for IUD insertion today.  Thought is that will decrease her flow and thus help with her symptomatic anemia.  3.  Dilated cardiomyopathy with LVEF of 50-55% on last echocardiogram.  She is continued on furosemide 20 mg as needed and propanolol at 10 mg 3 times a day as needed for palpitations.  4.   Morbid obesity with a BMI of 40.6.  She has been congratulated on her  weight loss as she has lost 15 pounds since May.  She is encouraged to continue with her weight loss which will in essence help with her shortness of breath and deconditioning.     Ms. Konieczny's perioperative risk of a major cardiac event is 0.9% according to the Revised Cardiac Risk Index (RCRI).  Therefore, she is at low risk for perioperative complications.   Her functional capacity is fair at 5.19 METs according to the [place] Activity Status Index (DASI). Recommendations: According to ACC/AHA guidelines, no further cardiovascular testing needed.  The patient may proceed to surgery at acceptable risk.    5.  Disposition patient return to clinic to see MD/APP in 6 months or sooner if needed  Ashlye Oviedo, NP 10/17/2022, 10:26 AM

## 2022-10-17 ENCOUNTER — Encounter: Payer: Self-pay | Admitting: Cardiology

## 2022-10-17 ENCOUNTER — Ambulatory Visit: Payer: Medicaid Other | Attending: Cardiology | Admitting: Cardiology

## 2022-10-17 VITALS — BP 120/88 | HR 86 | Ht 69.0 in | Wt 275.4 lb

## 2022-10-17 DIAGNOSIS — D649 Anemia, unspecified: Secondary | ICD-10-CM | POA: Diagnosis not present

## 2022-10-17 DIAGNOSIS — R0602 Shortness of breath: Secondary | ICD-10-CM | POA: Diagnosis not present

## 2022-10-17 DIAGNOSIS — I420 Dilated cardiomyopathy: Secondary | ICD-10-CM

## 2022-10-17 NOTE — Patient Instructions (Signed)
Medication Instructions:  No changes at this time.   *If you need a refill on your cardiac medications before your next appointment, please call your pharmacy*   Lab Work: None  If you have labs (blood work) drawn today and your tests are completely normal, you will receive your results only by: [place] (if you have MyChart) OR A paper copy in the mail If you have any lab test that is abnormal or we need to change your treatment, we will call you to review the results.   Testing/Procedures: None   Follow-Up: At [facility], you and your health needs are our priority.  As part of our continuing mission to provide you with exceptional heart care, we have created designated Provider Care Teams.  These Care Teams include your primary Cardiologist (physician) and Advanced Practice Providers (APPs -  Physician Assistants and Nurse Practitioners) who all work together to provide you with the care you need, when you need it.   Your next appointment:   6 month(s)  The format for your next appointment:   In Person  Provider:   Ida Rogue, MD or Gerrie Nordmann, NP        Important Information About Sugar

## 2022-11-08 ENCOUNTER — Ambulatory Visit: Payer: Medicaid Other | Admitting: Physician Assistant

## 2022-11-08 ENCOUNTER — Encounter: Payer: Medicaid Other | Admitting: Obstetrics and Gynecology

## 2022-11-10 ENCOUNTER — Other Ambulatory Visit: Payer: Self-pay

## 2022-11-10 DIAGNOSIS — N939 Abnormal uterine and vaginal bleeding, unspecified: Secondary | ICD-10-CM

## 2022-11-10 MED ORDER — NORETHINDRONE ACETATE 5 MG PO TABS: ORAL_TABLET | ORAL | 0 refills | Status: DC

## 2022-11-11 ENCOUNTER — Ambulatory Visit: Payer: Medicaid Other | Admitting: Pulmonary Disease

## 2022-12-05 ENCOUNTER — Ambulatory Visit: Payer: Medicaid Other | Admitting: Cardiovascular Disease

## 2022-12-27 ENCOUNTER — Ambulatory Visit (INDEPENDENT_AMBULATORY_CARE_PROVIDER_SITE_OTHER): Payer: Medicaid Other | Admitting: Pulmonary Disease

## 2022-12-27 ENCOUNTER — Encounter: Payer: Self-pay | Admitting: Pulmonary Disease

## 2022-12-27 VITALS — BP 124/84 | HR 77 | Temp 98.0°F | Ht 69.0 in | Wt 274.8 lb

## 2022-12-27 DIAGNOSIS — J386 Stenosis of larynx: Secondary | ICD-10-CM

## 2022-12-27 DIAGNOSIS — J4540 Moderate persistent asthma, uncomplicated: Secondary | ICD-10-CM | POA: Diagnosis not present

## 2022-12-27 DIAGNOSIS — J9611 Chronic respiratory failure with hypoxia: Secondary | ICD-10-CM

## 2022-12-27 DIAGNOSIS — J8410 Pulmonary fibrosis, unspecified: Secondary | ICD-10-CM | POA: Diagnosis not present

## 2022-12-27 MED ORDER — FORMOTEROL FUMARATE 20 MCG/2ML IN NEBU: 20.0000 ug | INHALATION_SOLUTION | Freq: Two times a day (BID) | RESPIRATORY_TRACT | 11 refills | Status: DC

## 2022-12-27 NOTE — Progress Notes (Deleted)
   Subjective:    Patient ID: Kathy Vega, female    DOB: Dec 02, 1975, 47 y.o.   MRN: [id number]  HPI    Review of Systems     Objective:   Physical Exam        Assessment & Plan:

## 2022-12-27 NOTE — Patient Instructions (Signed)
We are going to enroll you in pulmonary rehab.  I have added nebulizer medicine that you will use before your budesonide (Pulmicort).  It is called formoterol or Perforomist.  This also twice a day with the nebulizer.  This should help with your breathing.  We will see you in follow-up in 3 to 4 months time call sooner should any new problems arise.

## 2022-12-27 NOTE — Progress Notes (Signed)
Subjective:    Patient ID: Kathy Vega, female    DOB: [birth year], 47 y.o.   MRN: [id number] Patient Care Team: Center, [facility] as PCP - General (General Practice)  Chief Complaint  Patient presents with   Follow-up    No wheezing. Cough due to the trach. Occasional SOB.   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  Patient was last seen here on 12 August 2022 by me.  This is a scheduled visit.  As noted she has tracheal stenosis at has been followed by Dr. Beverely Risen at [facility].  She currently has a #4 Shiley.  Previously, she could not tolerate Passy-Muir valve however she is tolerating this better.  After an admission in November 2023 she ended up with O2 at 6 L/min.  However, I do not see a true indication for this.  Previously she had been on 2 L/min via trach collar.   With regards to her respiratory medications she is on albuterol and Pulmicort via nebulizer.  She endorses compliance with these.  She does have moderate persistent asthma and does need these medications. She is unable to use inhalers due to her tracheostomy dependence status.  Albuterol does not last long.  She does not have long-acting bronchodilator.   Ramah has severe hypothyroidism.  TSH last measured this was 167.  Previously it was 39.  At her last visit she was referred to endocrinology at [facility] this had to be reordered by primary care given her Medicaid status.  She is to see endocrinology at [facility] in the next few weeks.   Her main complaints today are those of fatigue and breathlessness when she tries to ambulate.  She interprets breathlessness as needed to have more oxygen. Continues to be fatigued and very short of breath.  Still disillusioned because her trach remains  due to tracheal stenosis.  She needs to have tracheal revision by Dr. Beverely Risen however she would like to postpone it because she has "a lot going on".  She is having trach changes every 3 weeks.  [facility] by Dr. Beverely Risen.   Review of Systems A 10 point review of systems was performed and it is as noted above otherwise negative.  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) 09/18/2020   Acute respiratory failure ([place])    Pneumonia due to COVID-19 virus 08/13/2020   Acute renal failure (ARF) ([place]) 08/13/2020   Elevated liver enzymes 08/13/2020   CAP (community acquired pneumonia) 08/13/2020   Acute encephalopathy 08/13/2020   Hypokalemia 08/13/2020   Social History   Tobacco Use   Smoking status: Former    Packs/day: 0.50    Years: 5.00    Total pack years: 2.50    Types: Cigarettes    Quit date: 2000    Years since quitting: 24.0   Smokeless tobacco: Never  Substance Use Topics   Alcohol use: No  Current Meds  Medication Sig   acetaminophen (TYLENOL) 500 MG tablet Take 500 mg by mouth every 6 (six) hours as needed for headache, fever, moderate pain or mild pain.   albuterol (PROVENTIL) (2.5 MG/3ML) 0.083% nebulizer solution Take 3 mLs (2.5 mg total) by nebulization every 6 (six) hours as needed for wheezing or shortness of breath.   ascorbic acid (VITAMIN C) 500 MG tablet Take 1 tablet (500 mg total) by mouth daily.   budesonide (PULMICORT) 0.5 MG/2ML nebulizer solution Take 2 mLs (0.5 mg total) by nebulization 2 (two) times daily.   furosemide (LASIX) 20 MG tablet Take  1 tablet (20 mg total) by mouth daily as needed for edema (shortness of breath).   hydrOXYzine (ATARAX) 10 MG tablet Take 10 mg by mouth 3 (three) times daily.   iron polysaccharides (NIFEREX) 150 MG capsule Take 1 capsule (150 mg total) by mouth daily.   levothyroxine (SYNTHROID) 125 MCG tablet Take 250 mcg by mouth daily.   norethindrone (AYGESTIN) 5 MG tablet TAKE 1 TABLET BY MOUTH EVERY MORNING, 1 TABLET EVERY DAY AT NOON, AND 1 TABLET EVERY NIGHT AT BEDTIME   nystatin cream (MYCOSTATIN) Apply topically 2 (two) times daily.   OXYGEN Inhale into the lungs at bedtime as needed. Up to 4 liters   pantoprazole (PROTONIX) 40 MG tablet Take 1 tablet (40 mg total) by mouth daily.   propranolol (INDERAL) 10 MG tablet Take 1 tablet (10 mg total) by mouth 3 (three) times daily as needed (palpitations).   No Known Allergies  Current Meds  Medication Sig   acetaminophen (TYLENOL) 500 MG tablet Take 500 mg by mouth every 6 (six) hours as needed for headache, fever, moderate pain or mild pain.   albuterol (PROVENTIL) (2.5 MG/3ML) 0.083% nebulizer solution Take 3 mLs (2.5 mg total) by nebulization every 6 (six) hours as needed for wheezing or shortness of breath.   ascorbic acid (VITAMIN C) 500 MG tablet Take 1 tablet (500 mg total) by mouth daily.   budesonide (PULMICORT) 0.5 MG/2ML nebulizer solution Take 2 mLs (0.5 mg total) by nebulization 2 (two) times daily.   formoterol (PERFOROMIST) 20 MCG/2ML nebulizer solution Take 2 mLs (20 mcg total) by nebulization 2 (two) times daily. Use before the budesonide, twice a day.   furosemide (LASIX) 20 MG tablet Take 1 tablet (20 mg total) by mouth daily as needed for edema (shortness of breath).   hydrOXYzine (ATARAX) 10 MG tablet Take 10 mg by mouth 3 (three) times daily.   iron polysaccharides (NIFEREX) 150 MG capsule Take 1 capsule (150 mg total) by mouth daily.   levothyroxine (SYNTHROID) 125 MCG tablet Take 250 mcg by mouth daily.   norethindrone (AYGESTIN)  5 MG tablet TAKE 1 TABLET BY MOUTH EVERY MORNING, 1 TABLET EVERY DAY AT NOON, AND 1 TABLET EVERY NIGHT AT BEDTIME   nystatin cream (MYCOSTATIN) Apply topically 2 (two) times daily.   OXYGEN Inhale into the lungs at bedtime as needed. Up to 4 liters   pantoprazole (PROTONIX) 40 MG tablet Take 1 tablet (40 mg total) by mouth daily.   propranolol (INDERAL) 10 MG tablet Take 1 tablet (10 mg total) by mouth 3 (three) times daily as needed (palpitations).    There is no immunization history on file for this patient.      Objective: Short   Physical Exam BP 124/84 (BP Location: Left Arm, Cuff Size: Large)   Pulse 77   Temp 98 F (36.7 C)   Ht '5\' 9"'$  (  1.753 m)   Wt 274 lb 12.8 oz (124.6 kg)   SpO2 100%   BMI 40.58 kg/m   SpO2: 100 % O2 Device: Nasal cannula O2 Flow Rate (L/min): 6 L/min  GENERAL: Obese woman, no acute distress, tracheostomy in place.  He has a Passy-Muir valve and able to speak without difficulty.  Uses trach collar for supplemental oxygen. HEAD: Normocephalic, atraumatic.  EYES: Pupils equal, round, reactive to light.  No scleral icterus.  MOUTH: Oral mucosa moist.  No thrush. NECK: Supple. No lymphadenopathy.  Tracheostomy in place as above with Passy-Muir valve. No JVD.  Trachea midline. PULMONARY: Good air entry bilaterally.  Scattered end expiratory wheezes noted.   CARDIOVASCULAR: S1 and S2. Regular rate and rhythm.  No rubs, murmurs or gallops heard. ABDOMEN: Obese otherwise unremarkable. MUSCULOSKELETAL: No joint deformity, no clubbing, no edema.  NEUROLOGIC: No overt focal deficit. SKIN: Intact,warm,dry. PSYCH: Mood and behavior normal.  Ambulatory oximetry was performed today: Oxygen flow was decreased to 2 L/min.  Patient maintain oxygen saturations between 95 to 97% with ambulation.  Heart rate increased significantly with minimal activity.  This is likely due to deconditioning.    Assessment & Plan:     ICD-10-CM   1. 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 Chronic respiratory failure with hypoxia (HCC)  J96.11    Multifactorial Postinflammatory pulmonary fibrosis Obesity with alveolar hypoventilation O2 supplementation decreased to 2 L/min    5. Obesity, morbid, BMI 40.0-49.9 (HCC)  E66.01    This issue adds complexity to her management Weight loss is recommended     Orders Placed This Encounter  Procedures   AMB referral to pulmonary rehabilitation    Referral Priority:   Routine    Referral Type:   Consultation    Number of Visits Requested:   1   Meds ordered this encounter  Medications   formoterol (PERFOROMIST) 20 MCG/2ML nebulizer solution    Sig: Take 2 mLs (20 mcg total) by nebulization 2 (two) times daily. Use before the budesonide, twice a day.    Dispense:  120 mL    Refill:  11   Patient has been referred to pulmonary rehabilitation.  We have added Perforomist to her current nebulizer regimen.  She was instructed on the proper use.  We will see her in follow-up in 3 to 4 months time she is to call sooner should any new problems arise.  Renold Don, MD Advanced Bronchoscopy PCCM [facility]    *This note was dictated using voice recognition software/Dragon.  Despite best efforts to proofread, errors can occur which can change the meaning. Any transcriptional errors that result from this process are unintentional and may not be fully corrected at the time of dictation.

## 2023-01-16 ENCOUNTER — Other Ambulatory Visit: Payer: Self-pay

## 2023-01-16 ENCOUNTER — Encounter: Payer: Medicaid Other | Attending: Pulmonary Disease

## 2023-01-16 DIAGNOSIS — J4540 Moderate persistent asthma, uncomplicated: Secondary | ICD-10-CM | POA: Insufficient documentation

## 2023-01-16 DIAGNOSIS — Z5189 Encounter for other specified aftercare: Secondary | ICD-10-CM | POA: Insufficient documentation

## 2023-01-16 DIAGNOSIS — R0600 Dyspnea, unspecified: Secondary | ICD-10-CM | POA: Insufficient documentation

## 2023-01-16 NOTE — Progress Notes (Signed)
Virtual Visit completed. Patient informed on EP and RD appointment and 6 Minute walk test. Patient also informed of patient health questionnaires on My Chart. Patient Verbalizes understanding. Visit diagnosis can be found in [facility] 12/27/2022.

## 2023-01-23 ENCOUNTER — Ambulatory Visit: Payer: Medicaid Other

## 2023-01-24 ENCOUNTER — Telehealth: Payer: Self-pay

## 2023-01-24 ENCOUNTER — Encounter: Payer: Self-pay | Admitting: Internal Medicine

## 2023-01-24 ENCOUNTER — Other Ambulatory Visit (HOSPITAL_COMMUNITY): Payer: Self-pay

## 2023-01-24 NOTE — Telephone Encounter (Signed)
PA request received via CMM for Formoterol Fumarate 20MCG/2ML nebulizer solution  CMM request is through [place] Medicaid.  PA has been submitted and has been APPROVED from 01/24/2023-01/24/2024  Key: [facility]

## 2023-01-26 ENCOUNTER — Other Ambulatory Visit: Payer: Self-pay | Admitting: *Deleted

## 2023-01-26 VITALS — Ht 69.0 in | Wt 275.3 lb

## 2023-01-26 DIAGNOSIS — R0600 Dyspnea, unspecified: Secondary | ICD-10-CM | POA: Diagnosis not present

## 2023-01-26 DIAGNOSIS — J4540 Moderate persistent asthma, uncomplicated: Secondary | ICD-10-CM | POA: Diagnosis present

## 2023-01-26 DIAGNOSIS — D500 Iron deficiency anemia secondary to blood loss (chronic): Secondary | ICD-10-CM

## 2023-01-26 DIAGNOSIS — Z5189 Encounter for other specified aftercare: Secondary | ICD-10-CM | POA: Diagnosis not present

## 2023-01-26 MED FILL — Iron Sucrose Inj 20 MG/ML (Fe Equiv): INTRAVENOUS | Qty: 10 | Status: AC

## 2023-01-26 NOTE — Patient Instructions (Signed)
Patient Instructions  Patient Details  Name: Kathy Vega MRN: [id number] Date of Birth: July 20, 1976 Referring Provider:  Tyler Pita, MD  Below are your personal goals for exercise, nutrition, and risk factors. Our goal is to help you stay on track towards obtaining and maintaining these goals. We will be discussing your progress on these goals with you throughout the program.  Initial Exercise Prescription:  Initial Exercise Prescription - 01/26/23 1600       Date of Initial Exercise RX and Referring Provider   Date 01/26/23    Referring Provider Vernard Gambles MD      Oxygen   Oxygen Continuous    Liters 4   Trach collar; 28% Fio2   Maintain Oxygen Saturation 88% or higher      Recumbant Bike   Level 1    RPM 50    Watts 15    Minutes 15    METs 1.9      T5 Nustep   Level 1    SPM 80    Minutes 15    METs 1.9      Biostep-RELP   Level 1    SPM 50    Minutes 15    METs 1.9      Track   Laps 4   Breaks as needed   Minutes 15    METs 1.1      Prescription Details   Frequency (times per week) 2    Duration Progress to 30 minutes of continuous aerobic without signs/symptoms of physical distress      Intensity   THRR 40-80% of Max Heartrate 123- 156    Ratings of Perceived Exertion 11-13    Perceived Dyspnea 0-4      Progression   Progression Continue to progress workloads to maintain intensity without signs/symptoms of physical distress.      Resistance Training   Training Prescription Yes    Weight 3 lb   no overhead exercises; relayed to patient   Reps 10-15             Exercise Goals: Frequency: Be able to perform aerobic exercise two to three times per week in program working toward 2-5 days per week of home exercise.  Intensity: Work with a perceived exertion of 11 (fairly light) - 15 (hard) while following your exercise prescription.  We will make changes to your prescription with you as you progress through the program.    Duration: Be able to do 30 to 45 minutes of continuous aerobic exercise in addition to a 5 minute warm-up and a 5 minute cool-down routine.   Nutrition Goals: Your personal nutrition goals will be established when you do your nutrition analysis with the dietician.  The following are general nutrition guidelines to follow: Cholesterol < '200mg'$ /day Sodium < '1500mg'$ /day Fiber: Women under 50 yrs - 25 grams per day  Personal Goals:  Personal Goals and Risk Factors at Admission - 01/26/23 1646       Core Components/Risk Factors/Patient Goals on Admission    Weight Management Yes;Weight Loss;Obesity    Intervention Weight Management: Develop a combined nutrition and exercise program designed to reach desired caloric intake, while maintaining appropriate intake of nutrient and fiber, sodium and fats, and appropriate energy expenditure required for the weight goal.;Weight Management: Provide education and appropriate resources to help participant work on and attain dietary goals.;Weight Management/Obesity: Establish reasonable short term and long term weight goals.;Obesity: Provide education and appropriate resources to help participant work on  and attain dietary goals.    Admit Weight 275 lb (124.7 kg)    Goal Weight: Short Term 270 lb (122.5 kg)    Goal Weight: Long Term 225 lb (102.1 kg)    Expected Outcomes Short Term: Continue to assess and modify interventions until short term weight is achieved;Long Term: Adherence to nutrition and physical activity/exercise program aimed toward attainment of established weight goal;Weight Loss: Understanding of general recommendations for a balanced deficit meal plan, which promotes 1-2 lb weight loss per week and includes a negative energy balance of [id number] kcal/d;Understanding recommendations for meals to include 15-35% energy as protein, 25-35% energy from fat, 35-60% energy from carbohydrates, less than '200mg'$  of dietary cholesterol, 20-35 gm of total fiber  daily;Understanding of distribution of calorie intake throughout the day with the consumption of 4-5 meals/snacks    Improve shortness of breath with ADL's Yes    Intervention Provide education, individualized exercise plan and daily activity instruction to help decrease symptoms of SOB with activities of daily living.    Expected Outcomes Short Term: Improve cardiorespiratory fitness to achieve a reduction of symptoms when performing ADLs;Long Term: Be able to perform more ADLs without symptoms or delay the onset of symptoms    Increase knowledge of respiratory medications and ability to use respiratory devices properly  Yes    Intervention Provide education and demonstration as needed of appropriate use of medications, inhalers, and oxygen therapy.    Expected Outcomes Short Term: Achieves understanding of medications use. Understands that oxygen is a medication prescribed by physician. Demonstrates appropriate use of inhaler and oxygen therapy.;Long Term: Maintain appropriate use of medications, inhalers, and oxygen therapy.             Tobacco Use Initial Evaluation: Social History   Tobacco Use  Smoking Status Former   Packs/day: 0.50   Years: 5.00   Total pack years: 2.50   Types: Cigarettes   Quit date: 2000   Years since quitting: 24.1  Smokeless Tobacco Never    Exercise Goals and Review:  Exercise Goals     Row Name 01/26/23 1645             Exercise Goals   Increase Physical Activity Yes       Intervention Develop an individualized exercise prescription for aerobic and resistive training based on initial evaluation findings, risk stratification, comorbidities and participant's personal goals.;Provide advice, education, support and counseling about physical activity/exercise needs.       Expected Outcomes Short Term: Attend rehab on a regular basis to increase amount of physical activity.;Long Term: Add in home exercise to make exercise part of routine and to  increase amount of physical activity.;Long Term: Exercising regularly at least 3-5 days a week.       Increase Strength and Stamina Yes       Intervention Provide advice, education, support and counseling about physical activity/exercise needs.       Expected Outcomes Short Term: Perform resistance training exercises routinely during rehab and add in resistance training at home;Long Term: Improve cardiorespiratory fitness, muscular endurance and strength as measured by increased METs and functional capacity (6MWT);Short Term: Increase workloads from initial exercise prescription for resistance, speed, and METs.       Able to understand and use rate of perceived exertion (RPE) scale Yes       Intervention Provide education and explanation on how to use RPE scale       Expected Outcomes Short Term: Able to use RPE daily  in rehab to express subjective intensity level;Long Term:  Able to use RPE to guide intensity level when exercising independently       Able to understand and use Dyspnea scale Yes       Intervention Provide education and explanation on how to use Dyspnea scale       Expected Outcomes Long Term: Able to use Dyspnea scale to guide intensity level when exercising independently;Short Term: Able to use Dyspnea scale daily in rehab to express subjective sense of shortness of breath during exertion       Knowledge and understanding of Target Heart Rate Range (THRR) Yes       Intervention Provide education and explanation of THRR including how the numbers were predicted and where they are located for reference       Expected Outcomes Short Term: Able to state/look up THRR;Long Term: Able to use THRR to govern intensity when exercising independently;Short Term: Able to use daily as guideline for intensity in rehab       Able to check pulse independently Yes       Intervention Provide education and demonstration on how to check pulse in carotid and radial arteries.;Review the importance of being  able to check your own pulse for safety during independent exercise       Expected Outcomes Long Term: Able to check pulse independently and accurately;Short Term: Able to explain why pulse checking is important during independent exercise       Understanding of Exercise Prescription Yes       Intervention Provide education, explanation, and written materials on patient's individual exercise prescription       Expected Outcomes Short Term: Able to explain program exercise prescription;Long Term: Able to explain home exercise prescription to exercise independently                Copy of goals given to participant.

## 2023-01-26 NOTE — Progress Notes (Signed)
Pulmonary Individual Treatment Plan  Patient Details  Name: Kathy Vega MRN: [id number] Date of Birth: [birth year] Referring Provider:   Flowsheet Row Pulmonary Rehab from 01/26/2023 in [facility] Cardiac and Pulmonary Rehab  Referring Provider Vernard Gambles MD       Initial Encounter Date:  Flowsheet Row Pulmonary Rehab from 01/26/2023 in [facility] Cardiac and Pulmonary Rehab  Date 01/26/23       Visit Diagnosis: Moderate persistent asthma in adult without complication  Patient's Home Medications on Admission:  Current Outpatient Medications:    acetaminophen (TYLENOL) 500 MG tablet, Take 500 mg by mouth every 6 (six) hours as needed for headache, fever, moderate pain or mild pain., Disp: , Rfl:    albuterol (PROVENTIL) (2.5 MG/3ML) 0.083% nebulizer solution, Take 3 mLs (2.5 mg total) by nebulization every 6 (six) hours as needed for wheezing or shortness of breath., Disp: 360 mL, Rfl: 11   ascorbic acid (VITAMIN C) 500 MG tablet, Take 1 tablet (500 mg total) by mouth daily., Disp: 30 tablet, Rfl: 0   budesonide (PULMICORT) 0.5 MG/2ML nebulizer solution, Take 2 mLs (0.5 mg total) by nebulization 2 (two) times daily., Disp: 120 mL, Rfl: 5   formoterol (PERFOROMIST) 20 MCG/2ML nebulizer solution, Take 2 mLs (20 mcg total) by nebulization 2 (two) times daily. Use before the budesonide, twice a day., Disp: 120 mL, Rfl: 11   furosemide (LASIX) 20 MG tablet, Take 1 tablet (20 mg total) by mouth daily as needed for edema (shortness of breath)., Disp: 90 tablet, Rfl: 3   hydrOXYzine (ATARAX) 10 MG tablet, Take 10 mg by mouth 3 (three) times daily., Disp: , Rfl:    iron polysaccharides (NIFEREX) 150 MG capsule, Take 1 capsule (150 mg total) by mouth daily., Disp: 30 capsule, Rfl: 1   levothyroxine (SYNTHROID) 125 MCG tablet, Take 250 mcg by mouth daily., Disp: , Rfl:    norethindrone (AYGESTIN) 5 MG tablet, TAKE 1 TABLET BY MOUTH EVERY MORNING, 1 TABLET EVERY DAY AT NOON, AND 1 TABLET EVERY NIGHT AT  BEDTIME, Disp: 50 tablet, Rfl: 0   nystatin cream (MYCOSTATIN), Apply topically 2 (two) times daily., Disp: 30 g, Rfl: 0   OXYGEN, Inhale into the lungs at bedtime as needed. Up to 4 liters, Disp: , Rfl:    pantoprazole (PROTONIX) 40 MG tablet, Take 1 tablet (40 mg total) by mouth daily., Disp: 30 tablet, Rfl: 2   propranolol (INDERAL) 10 MG tablet, Take 1 tablet (10 mg total) by mouth 3 (three) times daily as needed (palpitations)., Disp: 90 tablet, Rfl: 6  Past Medical History: Past Medical History:  Diagnosis Date   Acute encephalopathy 08/13/2020   Acute renal failure (ARF) ([place]) 08/13/2020   Anemia    Asthma    Cervical cancer (HCC)    Hypothyroidism    Iron deficiency anemia    Obesity (BMI 30-39.9)    Pneumonia due to COVID-19 virus 08/13/2020   requiring trach and prolonged hospitalization   Subglottic stenosis    Tracheal stenosis    Tracheostomy tube present (HCC)     Tobacco Use: Social History   Tobacco Use  Smoking Status Former   Packs/day: 0.50   Years: 5.00   Total pack years: 2.50   Types: Cigarettes   Quit date: 2000   Years since quitting: 24.1  Smokeless Tobacco Never    Labs: Review Flowsheet  More data exists      Latest Ref Rng & Units 08/16/2020 08/17/2020 08/18/2020 08/20/2020 08/31/2020  Labs for ITP  Cardiac and Pulmonary Rehab  Trlycerides <150 mg/dL - 1,906  - - -  PH, Arterial 7.350 - 7.450 7.40  - 7.41  7.38  7.42  7.47   PCO2 arterial 32.0 - 48.0 mmHg 44  - 49  56  48  53   Bicarbonate 20.0 - 28.0 mmol/L 27.3  - 31.1  33.1  31.1  38.6   O2 Saturation % 98.8  - 92.3  94.6  93.1  92.5      Pulmonary Assessment Scores:  Pulmonary Assessment Scores     Row Name 01/26/23 1601         ADL UCSD   ADL Phase Entry     SOB Score total 100     Rest 3     Walk 4     Stairs 5     Bath 3     Dress 5     Shop 5       CAT Score   CAT Score 25       mMRC Score   mMRC Score 4              UCSD: Self-administered rating of  dyspnea associated with activities of daily living (ADLs) 6-point scale (0 = "not at all" to 5 = "maximal or unable to do because of breathlessness")  Scoring Scores range from 0 to 120.  Minimally important difference is 5 units  CAT: CAT can identify the health impairment of COPD patients and is better correlated with disease progression.  CAT has a scoring range of zero to 40. The CAT score is classified into four groups of low (less than 10), medium (10 - 20), high (21-30) and very high (31-40) based on the impact level of disease on health status. A CAT score over 10 suggests significant symptoms.  A worsening CAT score could be explained by an exacerbation, poor medication adherence, poor inhaler technique, or progression of COPD or comorbid conditions.  CAT MCID is 2 points  mMRC: mMRC (Modified Medical Research Council) Dyspnea Scale is used to assess the degree of baseline functional disability in patients of respiratory disease due to dyspnea. No minimal important difference is established. A decrease in score of 1 point or greater is considered a positive change.   Pulmonary Function Assessment:  Pulmonary Function Assessment - 01/16/23 1215       Breath   Shortness of Breath Yes;Limiting activity             Exercise Target Goals: Exercise Program Goal: Individual exercise prescription set using results from initial 6 min walk test and THRR while considering  patient's activity barriers and safety.   Exercise Prescription Goal: Initial exercise prescription builds to 30-45 minutes a day of aerobic activity, 2-3 days per week.  Home exercise guidelines will be given to patient during program as part of exercise prescription that the participant will acknowledge.  Education: Aerobic Exercise: - Group verbal and visual presentation on the components of exercise prescription. Introduces F.I.T.T principle from ACSM for exercise prescriptions.  Reviews F.I.T.T. principles of  aerobic exercise including progression. Written material given at graduation.   Education: Resistance Exercise: - Group verbal and visual presentation on the components of exercise prescription. Introduces F.I.T.T principle from ACSM for exercise prescriptions  Reviews F.I.T.T. principles of resistance exercise including progression. Written material given at graduation.    Education: Exercise & Equipment Safety: - Individual verbal instruction and demonstration of equipment use and safety with use of the equipment.  Flowsheet Row Pulmonary Rehab from 01/16/2023 in [facility] Cardiac and Pulmonary Rehab  Date 01/16/23  Educator [facility]  Instruction Review Code 1- Verbalizes Understanding       Education: Exercise Physiology & General Exercise Guidelines: - Group verbal and written instruction with models to review the exercise physiology of the cardiovascular system and associated critical values. Provides general exercise guidelines with specific guidelines to those with heart or lung disease.    Education: Flexibility, Balance, Mind/Body Relaxation: - Group verbal and visual presentation with interactive activity on the components of exercise prescription. Introduces F.I.T.T principle from ACSM for exercise prescriptions. Reviews F.I.T.T. principles of flexibility and balance exercise training including progression. Also discusses the mind body connection.  Reviews various relaxation techniques to help reduce and manage stress (i.e. Deep breathing, progressive muscle relaxation, and visualization). Balance handout provided to take home. Written material given at graduation.   Activity Barriers & Risk Stratification:  Activity Barriers & Cardiac Risk Stratification - 01/26/23 1617       Activity Barriers & Cardiac Risk Stratification   Activity Barriers Back Problems;Deconditioning;Muscular Weakness;Shortness of Breath;Other (comment)    Comments chronic back pain, bilateral knee pain,  tracheostomy in place             6 Minute Walk:  6 Minute Walk     Row Name 01/26/23 1626         6 Minute Walk   Phase Initial     Distance 395 feet     Walk Time 5.1 minutes     # of Rest Breaks 5  0.52-1:01; 1:32-1:43; 2:28-2:41; 3:37-3:43; 4:44-4:57     MPH 0.88     METS 1.94     RPE 13     Perceived Dyspnea  3     VO2 Peak 6.8     Symptoms Yes (comment)     Comments Back pain 7/10, SOB, fatigue     Resting HR 90 bpm     Resting BP 118/74     Resting Oxygen Saturation  99 %     Exercise Oxygen Saturation  during 6 min walk 94 %     Max Ex. HR 114 bpm     Max Ex. BP 132/80     2 Minute Post BP 120/76       Interval HR   1 Minute HR 101     2 Minute HR 110     3 Minute HR 112     4 Minute HR 108     5 Minute HR 114     6 Minute HR 111     2 Minute Post HR 87     Interval Heart Rate? Yes       Interval Oxygen   Interval Oxygen? Yes     Baseline Oxygen Saturation % 99 %     1 Minute Oxygen Saturation % 94 %     1 Minute Liters of Oxygen 4 L  trach collar; 28% Fio2     2 Minute Oxygen Saturation % 98 %     2 Minute Liters of Oxygen 4 L     3 Minute Oxygen Saturation % 99 %     3 Minute Liters of Oxygen 4 L     4 Minute Oxygen Saturation % 99 %     4 Minute Liters of Oxygen 4 L     5 Minute Oxygen Saturation % 97 %     5 Minute Liters of Oxygen 4 L  6 Minute Oxygen Saturation % 96 %     6 Minute Liters of Oxygen 4 L     2 Minute Post Oxygen Saturation % 98 %     2 Minute Post Liters of Oxygen 4 L             Oxygen Initial Assessment:  Oxygen Initial Assessment - 01/26/23 1557       Home Oxygen   Home Oxygen Device E-Tanks;Home Concentrator    Sleep Oxygen Prescription Continuous   Trach Collar   Liters per minute 5    Home Exercise Oxygen Prescription Continuous   28% Fio2- trach collar   Liters per minute 4   28% Fio2 - trach collar   Home Resting Oxygen Prescription Continuous   28% Fio2- trach collar   Liters per minute 4     Compliance with Home Oxygen Use Yes      Initial 6 min Walk   Oxygen Used Continuous    Liters per minute 4    FiO2% 28   trach collar     Program Oxygen Prescription   Program Oxygen Prescription Continuous    Liters per minute 4    FiO2% 28   trach collar   Comments RT met with patient and reviewed information with patient.      Intervention   Short Term Goals To learn and exhibit compliance with exercise, home and travel O2 prescription;To learn and understand importance of monitoring SPO2 with pulse oximeter and demonstrate accurate use of the pulse oximeter.;To learn and demonstrate proper pursed lip breathing techniques or other breathing techniques. ;To learn and understand importance of maintaining oxygen saturations>88%;To learn and demonstrate proper use of respiratory medications    Long  Term Goals Exhibits compliance with exercise, home  and travel O2 prescription;Maintenance of O2 saturations>88%;Compliance with respiratory medication;Demonstrates proper use of MDI's;Exhibits proper breathing techniques, such as pursed lip breathing or other method taught during program session;Verbalizes importance of monitoring SPO2 with pulse oximeter and return demonstration             Oxygen Re-Evaluation:   Oxygen Discharge (Final Oxygen Re-Evaluation):   Initial Exercise Prescription:  Initial Exercise Prescription - 01/26/23 1600       Date of Initial Exercise RX and Referring Provider   Date 01/26/23    Referring Provider Vernard Gambles MD      Oxygen   Oxygen Continuous    Liters 4   Trach collar; 28% Fio2   Maintain Oxygen Saturation 88% or higher      Recumbant Bike   Level 1    RPM 50    Watts 15    Minutes 15    METs 1.9      T5 Nustep   Level 1    SPM 80    Minutes 15    METs 1.9      Biostep-RELP   Level 1    SPM 50    Minutes 15    METs 1.9      Track   Laps 4   Breaks as needed   Minutes 15    METs 1.1      Prescription Details    Frequency (times per week) 2    Duration Progress to 30 minutes of continuous aerobic without signs/symptoms of physical distress      Intensity   THRR 40-80% of Max Heartrate 123- 156    Ratings of Perceived Exertion 11-13    Perceived Dyspnea 0-4  Progression   Progression Continue to progress workloads to maintain intensity without signs/symptoms of physical distress.      Resistance Training   Training Prescription Yes    Weight 3 lb   no overhead exercises; relayed to patient   Reps 10-15             Perform Capillary Blood Glucose checks as needed.  Exercise Prescription Changes:   Exercise Prescription Changes     Row Name 01/26/23 1600             Response to Exercise   Blood Pressure (Admit) 118/74       Blood Pressure (Exercise) 132/80       Blood Pressure (Exit) 120/76       Heart Rate (Admit) 90 bpm       Heart Rate (Exercise) 114 bpm       Heart Rate (Exit) 87 bpm       Oxygen Saturation (Admit) 99 %       Oxygen Saturation (Exercise) 94 %       Oxygen Saturation (Exit) 98 %       Rating of Perceived Exertion (Exercise) 13       Perceived Dyspnea (Exercise) 3       Symptoms SOB, fatigue, back pain 7/10       Comments walk test results                Exercise Comments:   Exercise Goals and Review:   Exercise Goals     Row Name 01/26/23 1645             Exercise Goals   Increase Physical Activity Yes       Intervention Develop an individualized exercise prescription for aerobic and resistive training based on initial evaluation findings, risk stratification, comorbidities and participant's personal goals.;Provide advice, education, support and counseling about physical activity/exercise needs.       Expected Outcomes Short Term: Attend rehab on a regular basis to increase amount of physical activity.;Long Term: Add in home exercise to make exercise part of routine and to increase amount of physical activity.;Long Term: Exercising  regularly at least 3-5 days a week.       Increase Strength and Stamina Yes       Intervention Provide advice, education, support and counseling about physical activity/exercise needs.       Expected Outcomes Short Term: Perform resistance training exercises routinely during rehab and add in resistance training at home;Long Term: Improve cardiorespiratory fitness, muscular endurance and strength as measured by increased METs and functional capacity (6MWT);Short Term: Increase workloads from initial exercise prescription for resistance, speed, and METs.       Able to understand and use rate of perceived exertion (RPE) scale Yes       Intervention Provide education and explanation on how to use RPE scale       Expected Outcomes Short Term: Able to use RPE daily in rehab to express subjective intensity level;Long Term:  Able to use RPE to guide intensity level when exercising independently       Able to understand and use Dyspnea scale Yes       Intervention Provide education and explanation on how to use Dyspnea scale       Expected Outcomes Long Term: Able to use Dyspnea scale to guide intensity level when exercising independently;Short Term: Able to use Dyspnea scale daily in rehab to express subjective sense of shortness of breath during exertion  Knowledge and understanding of Target Heart Rate Range (THRR) Yes       Intervention Provide education and explanation of THRR including how the numbers were predicted and where they are located for reference       Expected Outcomes Short Term: Able to state/look up THRR;Long Term: Able to use THRR to govern intensity when exercising independently;Short Term: Able to use daily as guideline for intensity in rehab       Able to check pulse independently Yes       Intervention Provide education and demonstration on how to check pulse in carotid and radial arteries.;Review the importance of being able to check your own pulse for safety during independent  exercise       Expected Outcomes Long Term: Able to check pulse independently and accurately;Short Term: Able to explain why pulse checking is important during independent exercise       Understanding of Exercise Prescription Yes       Intervention Provide education, explanation, and written materials on patient's individual exercise prescription       Expected Outcomes Short Term: Able to explain program exercise prescription;Long Term: Able to explain home exercise prescription to exercise independently                Exercise Goals Re-Evaluation :   Discharge Exercise Prescription (Final Exercise Prescription Changes):  Exercise Prescription Changes - 01/26/23 1600       Response to Exercise   Blood Pressure (Admit) 118/74    Blood Pressure (Exercise) 132/80    Blood Pressure (Exit) 120/76    Heart Rate (Admit) 90 bpm    Heart Rate (Exercise) 114 bpm    Heart Rate (Exit) 87 bpm    Oxygen Saturation (Admit) 99 %    Oxygen Saturation (Exercise) 94 %    Oxygen Saturation (Exit) 98 %    Rating of Perceived Exertion (Exercise) 13    Perceived Dyspnea (Exercise) 3    Symptoms SOB, fatigue, back pain 7/10    Comments walk test results             Nutrition:  Target Goals: Understanding of nutrition guidelines, daily intake of sodium '1500mg'$ , cholesterol '200mg'$ , calories 30% from fat and 7% or less from saturated fats, daily to have 5 or more servings of fruits and vegetables.  Education: All About Nutrition: -Group instruction provided by verbal, written material, interactive activities, discussions, models, and posters to present general guidelines for heart healthy nutrition including fat, fiber, MyPlate, the role of sodium in heart healthy nutrition, utilization of the nutrition label, and utilization of this knowledge for meal planning. Follow up email sent as well. Written material given at graduation.   Biometrics:  Pre Biometrics - 01/26/23 1624       Pre  Biometrics   Height '5\' 9"'$  (1.753 m)    Weight 275 lb 4.8 oz (124.9 kg)    Waist Circumference 51 inches    Hip Circumference 52.5 inches    Waist to Hip Ratio 0.97 %    BMI (Calculated) 40.64    Single Leg Stand 14.2 seconds              Nutrition Therapy Plan and Nutrition Goals:  Nutrition Therapy & Goals - 01/26/23 1603       Nutrition Therapy   RD appointment deferred Yes   Deferred 01/26/23            Nutrition Assessments:  MEDIFICTS Score Key: ?70 Need to make dietary  changes  40-70 Heart Healthy Diet ? 40 Therapeutic Level Cholesterol Diet  Flowsheet Row Pulmonary Rehab from 01/26/2023 in [facility] Cardiac and Pulmonary Rehab  Picture Your Plate Total Score on Admission 47      Picture Your Plate Scores: [id number] Unhealthy dietary pattern with much room for improvement. 41-50 Dietary pattern unlikely to meet recommendations for good health and room for improvement. 51-60 More healthful dietary pattern, with some room for improvement.  >60 Healthy dietary pattern, although there may be some specific behaviors that could be improved.   Nutrition Goals Re-Evaluation:   Nutrition Goals Discharge (Final Nutrition Goals Re-Evaluation):   Psychosocial: Target Goals: Acknowledge presence or absence of significant depression and/or stress, maximize coping skills, provide positive support system. Participant is able to verbalize types and ability to use techniques and skills needed for reducing stress and depression.   Education: Stress, Anxiety, and Depression - Group verbal and visual presentation to define topics covered.  Reviews how body is impacted by stress, anxiety, and depression.  Also discusses healthy ways to reduce stress and to treat/manage anxiety and depression.  Written material given at graduation.   Education: Sleep Hygiene -Provides group verbal and written instruction about how sleep can affect your health.  Define sleep hygiene, discuss sleep  cycles and impact of sleep habits. Review good sleep hygiene tips.    Initial Review & Psychosocial Screening:  Initial Psych Review & Screening - 01/16/23 1217       Initial Review   Current issues with Current Stress Concerns    Source of Stress Concerns Chronic Illness    Comments Patient states that she has her kids and husband for support. Other parts of her family can be caled for support but not often. She wants to get her health back on track and breath better.      Family Dynamics   Good Support System? Yes    Comments Short: Start [facility] to help with mood. Long: Maintain a healthy mental state.      Screening Interventions   Interventions Encouraged to exercise;To provide support and resources with identified psychosocial needs;Provide feedback about the scores to participant    Expected Outcomes Short Term goal: Utilizing psychosocial counselor, staff and physician to assist with identification of specific Stressors or current issues interfering with healing process. Setting desired goal for each stressor or current issue identified.;Long Term Goal: Stressors or current issues are controlled or eliminated.;Short Term goal: Identification and review with participant of any Quality of Life or Depression concerns found by scoring the questionnaire.;Long Term goal: The participant improves quality of Life and PHQ9 Scores as seen by post scores and/or verbalization of changes             Quality of Life Scores:  Scores of 19 and below usually indicate a poorer quality of life in these areas.  A difference of  2-3 points is a clinically meaningful difference.  A difference of 2-3 points in the total score of the Quality of Life Index has been associated with significant improvement in overall quality of life, self-image, physical symptoms, and general health in studies assessing change in quality of life.  PHQ-9: Review Flowsheet       01/26/2023  Depression screen PHQ 2/9   Decreased Interest 3  Down, Depressed, Hopeless 1  PHQ - 2 Score 4  Altered sleeping 0  Tired, decreased energy 3  Change in appetite 0  Feeling bad or failure about yourself  0  Trouble concentrating  0  Moving slowly or fidgety/restless 0  Suicidal thoughts 0  PHQ-9 Score 7  Difficult doing work/chores Somewhat difficult   Interpretation of Total Score  Total Score Depression Severity:  1-4 = Minimal depression, 5-9 = Mild depression, 10-14 = Moderate depression, 15-19 = Moderately severe depression, 20-27 = Severe depression   Psychosocial Evaluation and Intervention:  Psychosocial Evaluation - 01/16/23 1220       Psychosocial Evaluation & Interventions   Interventions Stress management education;Relaxation education;Encouraged to exercise with the program and follow exercise prescription    Comments Patient states that she has her kids and husband for support. Other parts of her family can be caled for support but not often. She wants to get her health back on track and breath better.    Expected Outcomes Short: Start [facility] to help with mood. Long: Maintain a healthy mental state.    Continue Psychosocial Services  Follow up required by staff             Psychosocial Re-Evaluation:   Psychosocial Discharge (Final Psychosocial Re-Evaluation):   Education: Education Goals: Education classes will be provided on a weekly basis, covering required topics. Participant will state understanding/return demonstration of topics presented.  Learning Barriers/Preferences:  Learning Barriers/Preferences - 01/16/23 1215       Learning Barriers/Preferences   Learning Barriers None    Learning Preferences None             General Pulmonary Education Topics:  Infection Prevention: - Provides verbal and written material to individual with discussion of infection control including proper hand washing and proper equipment cleaning during exercise session. Flowsheet Row  Pulmonary Rehab from 01/16/2023 in [facility] Cardiac and Pulmonary Rehab  Date 01/16/23  Educator [facility]  Instruction Review Code 1- Verbalizes Understanding       Falls Prevention: - Provides verbal and written material to individual with discussion of falls prevention and safety. Flowsheet Row Pulmonary Rehab from 01/16/2023 in [facility] Cardiac and Pulmonary Rehab  Date 01/16/23  Educator [facility]  Instruction Review Code 1- Verbalizes Understanding       Chronic Lung Disease Review: - Group verbal instruction with posters, models, PowerPoint presentations and videos,  to review new updates, new respiratory medications, new advancements in procedures and treatments. Providing information on websites and "800" numbers for continued self-education. Includes information about supplement oxygen, available portable oxygen systems, continuous and intermittent flow rates, oxygen safety, concentrators, and Medicare reimbursement for oxygen. Explanation of Pulmonary Drugs, including class, frequency, complications, importance of spacers, rinsing mouth after steroid MDI's, and proper cleaning methods for nebulizers. Review of basic lung anatomy and physiology related to function, structure, and complications of lung disease. Review of risk factors. Discussion about methods for diagnosing sleep apnea and types of masks and machines for OSA. Includes a review of the use of types of environmental controls: home humidity, furnaces, filters, dust mite/pet prevention, HEPA vacuums. Discussion about weather changes, air quality and the benefits of nasal washing. Instruction on Warning signs, infection symptoms, calling MD promptly, preventive modes, and value of vaccinations. Review of effective airway clearance, coughing and/or vibration techniques. Emphasizing that all should Create an Action Plan. Written material given at graduation. Flowsheet Row Pulmonary Rehab from 01/26/2023 in [facility] Card[facility]  Education  need identified 01/26/23       AED/CPR: - Group verbal and written instruction with the use of models to demonstrate the basic use of the AED with the basic ABC's of resuscitation.    Anatomy  and Cardiac Procedures: - Group verbal and visual presentation and models provide information about basic cardiac anatomy and function. Reviews the testing methods done to diagnose heart disease and the outcomes of the test results. Describes the treatment choices: Medical Management, Angioplasty, or Coronary Bypass Surgery for treating various heart conditions including Myocardial Infarction, Angina, Valve Disease, and Cardiac Arrhythmias.  Written material given at graduation.   Medication Safety: - Group verbal and visual instruction to review commonly prescribed medications for heart and lung disease. Reviews the medication, class of the drug, and side effects. Includes the steps to properly store meds and maintain the prescription regimen.  Written material given at graduation.   Other: -Provides group and verbal instruction on various topics (see comments)   Knowledge Questionnaire Score:  Knowledge Questionnaire Score - 01/26/23 1557       Knowledge Questionnaire Score   Pre Score 15/18              Core Components/Risk Factors/Patient Goals at Admission:  Personal Goals and Risk Factors at Admission - 01/26/23 1646       Core Components/Risk Factors/Patient Goals on Admission    Weight Management Yes;Weight Loss;Obesity    Intervention Weight Management: Develop a combined nutrition and exercise program designed to reach desired caloric intake, while maintaining appropriate intake of nutrient and fiber, sodium and fats, and appropriate energy expenditure required for the weight goal.;Weight Management: Provide education and appropriate resources to help participant work on and attain dietary goals.;Weight Management/Obesity: Establish reasonable short term and long term weight  goals.;Obesity: Provide education and appropriate resources to help participant work on and attain dietary goals.    Admit Weight 275 lb (124.7 kg)    Goal Weight: Short Term 270 lb (122.5 kg)    Goal Weight: Long Term 225 lb (102.1 kg)    Expected Outcomes Short Term: Continue to assess and modify interventions until short term weight is achieved;Long Term: Adherence to nutrition and physical activity/exercise program aimed toward attainment of established weight goal;Weight Loss: Understanding of general recommendations for a balanced deficit meal plan, which promotes 1-2 lb weight loss per week and includes a negative energy balance of [id number] kcal/d;Understanding recommendations for meals to include 15-35% energy as protein, 25-35% energy from fat, 35-60% energy from carbohydrates, less than '200mg'$  of dietary cholesterol, 20-35 gm of total fiber daily;Understanding of distribution of calorie intake throughout the day with the consumption of 4-5 meals/snacks    Improve shortness of breath with ADL's Yes    Intervention Provide education, individualized exercise plan and daily activity instruction to help decrease symptoms of SOB with activities of daily living.    Expected Outcomes Short Term: Improve cardiorespiratory fitness to achieve a reduction of symptoms when performing ADLs;Long Term: Be able to perform more ADLs without symptoms or delay the onset of symptoms    Increase knowledge of respiratory medications and ability to use respiratory devices properly  Yes    Intervention Provide education and demonstration as needed of appropriate use of medications, inhalers, and oxygen therapy.    Expected Outcomes Short Term: Achieves understanding of medications use. Understands that oxygen is a medication prescribed by physician. Demonstrates appropriate use of inhaler and oxygen therapy.;Long Term: Maintain appropriate use of medications, inhalers, and oxygen therapy.              Education:Diabetes - Individual verbal and written instruction to review signs/symptoms of diabetes, desired ranges of glucose level fasting, after meals and with exercise. Acknowledge that pre and  post exercise glucose checks will be done for 3 sessions at entry of program.   Know Your Numbers and Heart Failure: - Group verbal and visual instruction to discuss disease risk factors for cardiac and pulmonary disease and treatment options.  Reviews associated critical values for Overweight/Obesity, Hypertension, Cholesterol, and Diabetes.  Discusses basics of heart failure: signs/symptoms and treatments.  Introduces Heart Failure Zone chart for action plan for heart failure.  Written material given at graduation.   Core Components/Risk Factors/Patient Goals Review:    Core Components/Risk Factors/Patient Goals at Discharge (Final Review):    ITP Comments:  ITP Comments     Row Name 01/16/23 1212 01/26/23 1553         ITP Comments Virtual Visit completed. Patient informed on EP and RD appointment and 6 Minute walk test. Patient also informed of patient health questionnaires on My Chart. Patient Verbalizes understanding. Visit diagnosis can be found in [facility] 12/27/2022. Completed 6MWT and gym orientation. Initial ITP created and sent for review to Dr. Ottie Glazier,  Medical Director.               Comments: Initial ITP

## 2023-01-27 ENCOUNTER — Inpatient Hospital Stay (HOSPITAL_BASED_OUTPATIENT_CLINIC_OR_DEPARTMENT_OTHER): Payer: Medicaid Other | Admitting: Internal Medicine

## 2023-01-27 ENCOUNTER — Inpatient Hospital Stay: Payer: Medicaid Other

## 2023-01-27 ENCOUNTER — Inpatient Hospital Stay: Payer: Medicaid Other | Admitting: Internal Medicine

## 2023-01-27 ENCOUNTER — Inpatient Hospital Stay: Payer: Medicaid Other | Attending: Internal Medicine

## 2023-01-27 ENCOUNTER — Encounter: Payer: Self-pay | Admitting: Internal Medicine

## 2023-01-27 VITALS — BP 127/78 | HR 98 | Temp 96.3°F | Resp 16 | Wt 276.0 lb

## 2023-01-27 VITALS — BP 127/68 | HR 75 | Resp 16

## 2023-01-27 DIAGNOSIS — E611 Iron deficiency: Secondary | ICD-10-CM | POA: Diagnosis not present

## 2023-01-27 DIAGNOSIS — D509 Iron deficiency anemia, unspecified: Secondary | ICD-10-CM | POA: Diagnosis not present

## 2023-01-27 DIAGNOSIS — D500 Iron deficiency anemia secondary to blood loss (chronic): Secondary | ICD-10-CM

## 2023-01-27 LAB — CBC WITH DIFFERENTIAL/PLATELET
Abs Immature Granulocytes: 0.02 10*3/uL (ref 0.00–0.07)
Basophils Absolute: 0.1 10*3/uL (ref 0.0–0.1)
Basophils Relative: 1 %
Eosinophils Absolute: 0.1 10*3/uL (ref 0.0–0.5)
Eosinophils Relative: 1 %
HCT: 25.1 % — ABNORMAL LOW (ref 36.0–46.0)
Hemoglobin: 7.2 g/dL — ABNORMAL LOW (ref 12.0–15.0)
Immature Granulocytes: 0 %
Lymphocytes Relative: 23 %
Lymphs Abs: 1.4 10*3/uL (ref 0.7–4.0)
MCH: 21.3 pg — ABNORMAL LOW (ref 26.0–34.0)
MCHC: 28.7 g/dL — ABNORMAL LOW (ref 30.0–36.0)
MCV: 74.3 fL — ABNORMAL LOW (ref 80.0–100.0)
Monocytes Absolute: 0.3 10*3/uL (ref 0.1–1.0)
Monocytes Relative: 5 %
Neutro Abs: 4.3 10*3/uL (ref 1.7–7.7)
Neutrophils Relative %: 70 %
Platelets: 418 10*3/uL — ABNORMAL HIGH (ref 150–400)
RBC: 3.38 MIL/uL — ABNORMAL LOW (ref 3.87–5.11)
RDW: 17.8 % — ABNORMAL HIGH (ref 11.5–15.5)
WBC: 6.2 10*3/uL (ref 4.0–10.5)
nRBC: 0 % (ref 0.0–0.2)

## 2023-01-27 LAB — BASIC METABOLIC PANEL
Anion gap: 7 (ref 5–15)
BUN: 8 mg/dL (ref 6–20)
CO2: 25 mmol/L (ref 22–32)
Calcium: 9.3 mg/dL (ref 8.9–10.3)
Chloride: 103 mmol/L (ref 98–111)
Creatinine, Ser: 0.95 mg/dL (ref 0.44–1.00)
GFR, Estimated: >60 mL/min (ref >60)
Glucose, Bld: 130 mg/dL — ABNORMAL HIGH (ref 70–99)
Potassium: 3.5 mmol/L (ref 3.5–5.1)
Sodium: 135 mmol/L (ref 135–145)

## 2023-01-27 LAB — IRON AND TIBC
Iron: 31 ug/dL (ref 28–170)
Saturation Ratios: 5 % — ABNORMAL LOW (ref 10.4–31.8)
TIBC: 571 ug/dL — ABNORMAL HIGH (ref 250–450)
UIBC: 540 ug/dL

## 2023-01-27 LAB — PREGNANCY, URINE: Preg Test, Ur: NEGATIVE

## 2023-01-27 LAB — FERRITIN: Ferritin: 5 ng/mL — ABNORMAL LOW (ref 11–307)

## 2023-01-27 MED ORDER — SODIUM CHLORIDE 0.9 % IV SOLN
200.0000 mg | Freq: Once | INTRAVENOUS | Status: AC
  Administered 2023-01-27: 200 mg via INTRAVENOUS
  Filled 2023-01-27: qty 200

## 2023-01-27 MED ORDER — SODIUM CHLORIDE 0.9 % IV SOLN
Freq: Once | INTRAVENOUS | Status: AC
  Filled 2023-01-27: qty 250

## 2023-01-27 NOTE — Progress Notes (Signed)
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  Alone.   Kathy Vega 47 y.o.  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   Appetite is fair. Energy is low. Does admit to dyspnea and dizziness. Has very heavy menstual cycles with clots and cramping. Does crave ice chips. Feels cold all the time in her hands and feet. Denies any rectal bleeding. Currently awaiting IUD.    Review of Systems  Constitutional:  Positive for malaise/fatigue. Negative for chills, diaphoresis, fever and weight loss.  HENT:  Negative for nosebleeds and sore throat.   Eyes:  Negative for double vision.  Respiratory:  Negative for cough, hemoptysis, sputum production, shortness of breath and wheezing.   Cardiovascular:  Negative for chest pain, palpitations, orthopnea and leg swelling.  Gastrointestinal:  Negative for abdominal pain, blood in stool, constipation, diarrhea, heartburn, melena, nausea and vomiting.  Genitourinary:  Negative for dysuria,  frequency and urgency.  Musculoskeletal:  Positive for back pain and joint pain.  Skin: Negative.  Negative for itching and rash.  Neurological:  Negative for dizziness, tingling, focal weakness, weakness and headaches.  Endo/Heme/Allergies:  Does not bruise/bleed easily.  Psychiatric/Behavioral:  Negative for depression. The patient is not nervous/anxious and does not have insomnia.     MEDICAL HISTORY:  Past Medical History:  Diagnosis Date   Acute encephalopathy 08/13/2020   Acute renal failure (ARF) ([place]) 08/13/2020   Anemia    Asthma    Cervical cancer (HCC)    Hypothyroidism    Iron deficiency anemia    Obesity (BMI 30-39.9)    Pneumonia due to COVID-19 virus 08/13/2020   requiring trach and prolonged hospitalization   Subglottic stenosis    Tracheal stenosis    Tracheostomy tube present [facility])     SURGICAL HISTORY: Past Surgical History:  Procedure Laterality Date   CARDIAC CATHETERIZATION     CERVICAL BIOPSY  W/ LOOP ELECTRODE EXCISION     CESAREAN SECTION     CHOLECYSTECTOMY     LARYNGOSCOPY  04/16/2021   LARYNGOSCOPY  02/25/2022   LARYNGOSCOPY  08/13/2021   LEG SURGERY     TRACHEOSTOMY TUBE PLACEMENT N/A 09/04/2020   Procedure: TRACHEOSTOMY;  Surgeon: Beverly Gust, MD;  Location: [facility] ORS;  Service: ENT;  Laterality: N/A;   TUBAL LIGATION      SOCIAL HISTORY: Social History   Socioeconomic History   Marital status: Married    Spouse name: Mliss Fritz   Number of children: 3   Years of education: Not on file   Highest education level: Not on file  Occupational History   Not on file  Tobacco Use   Smoking status: Former    Packs/day: 0.50    Years: 5.00    Total pack years: 2.50    Types: Cigarettes    Quit date: 2000    Years since quitting: 24.1   Smokeless tobacco: Never  Vaping Use   Vaping Use: Never used  Substance and Sexual Activity   Alcohol use: No   Drug use: Never   Sexual activity: Yes    Birth control/protection: Surgical     Comment: BTL  Other Topics Concern   Not on file  Social History Narrative   Not on file   Social Determinants of Health   Financial Resource Strain: Not on file  Food Insecurity: Not on file  Transportation Needs: Not on file  Physical Activity: Not on file  Stress: Not on file  Social Connections: Not on file  Intimate Partner Violence: Not on file    FAMILY HISTORY: Family History  Problem Relation Age of Onset   Hyperlipidemia Mother    Hypertension Mother    Heart disease Father    Liver cancer Father    Breast cancer Neg Hx    Ovarian cancer Neg Hx     ALLERGIES:  has No Known Allergies.  MEDICATIONS:  Current Outpatient Medications  Medication Sig Dispense Refill   albuterol (PROVENTIL) (2.5 MG/3ML) 0.083% nebulizer solution Take 3 mLs (2.5 mg total) by nebulization every 6 (six) hours as needed for wheezing or shortness of breath. 360 mL 11   ascorbic acid (VITAMIN C) 500 MG tablet Take 1 tablet (500 mg total) by mouth daily. 30 tablet 0   budesonide (PULMICORT) 0.5 MG/2ML nebulizer solution Take 2 mLs (0.5 mg total) by nebulization 2 (two) times daily. 120 mL 5   formoterol (PERFOROMIST) 20 MCG/2ML nebulizer solution Take 2 mLs (20 mcg total) by nebulization 2 (two) times daily. Use before the budesonide, twice a day. 120 mL 11   furosemide (LASIX) 20 MG tablet Take 1 tablet (20 mg total) by mouth daily as needed for edema (shortness of breath). 90 tablet 3   hydrOXYzine (ATARAX) 10 MG tablet Take 10 mg by mouth 3 (three) times daily.     iron polysaccharides (NIFEREX) 150 MG capsule Take 1 capsule (150 mg total) by mouth daily. 30 capsule 1   levothyroxine (SYNTHROID) 125 MCG tablet Take 250 mcg by mouth daily.     norethindrone (AYGESTIN) 5 MG tablet TAKE 1 TABLET BY MOUTH EVERY MORNING, 1 TABLET EVERY DAY AT NOON, AND 1 TABLET EVERY NIGHT AT BEDTIME 50 tablet 0   nystatin cream (MYCOSTATIN) Apply topically 2 (two) times daily. 30 g 0   OXYGEN Inhale into the  lungs at bedtime as needed. Up to 4 liters     pantoprazole (PROTONIX) 40 MG tablet Take 1 tablet (40 mg total) by mouth daily. 30 tablet 2   propranolol (INDERAL) 10 MG tablet Take 1 tablet (10 mg total) by mouth 3 (three) times daily as needed (palpitations). 90 tablet 6   acetaminophen (TYLENOL) 500 MG tablet Take 500 mg by mouth every 6 (six) hours as needed for headache, fever, moderate pain or mild pain.     No current facility-administered medications for this visit.   Facility-Administered Medications Ordered in Other Visits  Medication Dose Route Frequency Provider Last Rate Last Admin   0.9 %  sodium chloride infusion   Intravenous Once Charlaine Dalton R, MD       iron sucrose (VENOFER) 200 mg in sodium  chloride 0.9 % 100 mL IVPB  200 mg Intravenous Once Charlaine Dalton R, MD        PHYSICAL EXAMINATION:   Vitals:   01/27/23 1422  BP: 127/78  Pulse: 98  Resp: 16  Temp: (!) 96.3 F (35.7 C)  SpO2: 100%   Filed Weights   01/27/23 1422  Weight: 276 lb (125.2 kg)   Trach in place.   Physical Exam Vitals and nursing note reviewed.  HENT:     Head: Normocephalic and atraumatic.     Mouth/Throat:     Pharynx: Oropharynx is clear.  Eyes:     Extraocular Movements: Extraocular movements intact.     Pupils: Pupils are equal, round, and reactive to light.  Cardiovascular:     Rate and Rhythm: Normal rate and regular rhythm.  Pulmonary:     Comments: Decreased breath sounds bilaterally.  Abdominal:     Palpations: Abdomen is soft.  Musculoskeletal:        General: Normal range of motion.     Cervical back: Normal range of motion.  Skin:    General: Skin is warm.  Neurological:     General: No focal deficit present.     Mental Status: She is alert and oriented to person, place, and time.  Psychiatric:        Behavior: Behavior normal.        Judgment: Judgment normal.      LABORATORY DATA:  I have reviewed the data as listed Lab Results  Component  Value Date   WBC 6.2 01/27/2023   HGB 7.2 (L) 01/27/2023   HCT 25.1 (L) 01/27/2023   MCV 74.3 (L) 01/27/2023   PLT 418 (H) 01/27/2023   Recent Labs    07/18/22 0429 08/05/22 1227 08/14/22 0954 01/27/23 1358  NA 139 138 139 135  K 4.0 4.0 4.1 3.5  CL 107 107 109 103  CO2 '26 25 23 25  '$ GLUCOSE 114* 105* 127* 130*  BUN '8 11 8 8  '$ CREATININE 0.79 0.83 0.91 0.95  CALCIUM 9.2 9.2 9.1 9.3  GFRNONAA >60 >60 >60 >60  PROT 6.7 7.9 7.6  --   ALBUMIN 3.6 4.4 4.4  --   AST '19 16 26  '$ --   ALT '12 14 26  '$ --   ALKPHOS 54 66 68  --   BILITOT 0.8 0.4 0.5  --      No results found.  ASSESSMENT & PLAN:   Iron deficiency #Chronic severe anemia- Hb- 7-8; iron saturation 2% [AUG 2023]-very symptomatic.  Likely secondary to menorrhagia. S/p Venofer- Nov 2023- Hb 11.2; continue PO iron.   # Today Hb 7.2; Proceed with venofer today.   #Etiology of iron deficiency: Likely secondary to menorrhagia. S/p Dr. Amalia Hailey gynecology Clarisse Gouge IUD-].  Nov 13th, 2023. Down the line consider GI referral.   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 Recommend talking to PT/PCP re: rehab with ongoing anemia.    # DISPOSITION: # venofer today # venofer weekly x 3  # follow up in 2  months- MD: labs- cbc/bmp;possible Venofer- Dr.B  All questions were answered. The patient knows to call the clinic with any problems, questions or concerns.  Cammie Sickle, MD 01/27/2023 3:29 PM

## 2023-01-27 NOTE — Progress Notes (Signed)
Appetite is fair. Energy is low. Does admit to dyspnea and dizziness. Has very heavy menstual cycles with clots and cramping. Denies any rectal bleeding. Does crave ice chips. Feels cold all the time in her hands and feet.

## 2023-01-27 NOTE — Assessment & Plan Note (Addendum)
#  Chronic severe anemia- Hb- 7-8; iron saturation 2% [AUG 2023]-very symptomatic.  Likely secondary to menorrhagia. S/p Venofer- Nov 2023- Hb 11.2; continue PO iron.   # Today Hb 7.2; Proceed with venofer today.   #Etiology of iron deficiency: Likely secondary to menorrhagia. S/p Dr. Amalia Hailey gynecology Clarisse Gouge IUD-].  Nov 13th, 2023. Down the line consider GI referral.   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 Recommend talking to PT/PCP re: rehab with ongoing anemia.    # DISPOSITION: # venofer today # venofer weekly x 3  # follow up in 2  months- MD: labs- cbc/bmp;possible Venofer- Dr.B

## 2023-01-27 NOTE — Progress Notes (Signed)
Pt has been educated and understands. Pt refused to stay 30 mins after iron infusion. VSS.

## 2023-01-27 NOTE — Addendum Note (Signed)
Addended by: Sofie Rower A on: 01/27/2023 03:38 PM   Modules accepted: Orders

## 2023-02-03 ENCOUNTER — Inpatient Hospital Stay: Payer: Medicaid Other

## 2023-02-03 VITALS — BP 114/74 | HR 81 | Temp 97.6°F

## 2023-02-03 DIAGNOSIS — E611 Iron deficiency: Secondary | ICD-10-CM

## 2023-02-03 DIAGNOSIS — D509 Iron deficiency anemia, unspecified: Secondary | ICD-10-CM | POA: Diagnosis not present

## 2023-02-03 MED ORDER — SODIUM CHLORIDE 0.9 % IV SOLN
Freq: Once | INTRAVENOUS | Status: AC
  Filled 2023-02-03: qty 250

## 2023-02-03 MED ORDER — SODIUM CHLORIDE 0.9 % IV SOLN
200.0000 mg | Freq: Once | INTRAVENOUS | Status: AC
  Administered 2023-02-03: 200 mg via INTRAVENOUS
  Filled 2023-02-03: qty 200

## 2023-02-09 MED FILL — Iron Sucrose Inj 20 MG/ML (Fe Equiv): INTRAVENOUS | Qty: 10 | Status: AC

## 2023-02-10 ENCOUNTER — Inpatient Hospital Stay: Payer: Medicaid Other

## 2023-02-10 VITALS — BP 121/67 | HR 74 | Temp 96.4°F

## 2023-02-10 DIAGNOSIS — D509 Iron deficiency anemia, unspecified: Secondary | ICD-10-CM | POA: Diagnosis not present

## 2023-02-10 DIAGNOSIS — E611 Iron deficiency: Secondary | ICD-10-CM

## 2023-02-10 MED ORDER — SODIUM CHLORIDE 0.9 % IV SOLN
200.0000 mg | Freq: Once | INTRAVENOUS | Status: AC
  Administered 2023-02-10: 200 mg via INTRAVENOUS
  Filled 2023-02-10: qty 200

## 2023-02-10 MED ORDER — SODIUM CHLORIDE 0.9 % IV SOLN
Freq: Once | INTRAVENOUS | Status: AC
  Filled 2023-02-10: qty 250

## 2023-02-15 ENCOUNTER — Ambulatory Visit (INDEPENDENT_AMBULATORY_CARE_PROVIDER_SITE_OTHER): Payer: Medicaid Other | Admitting: Obstetrics and Gynecology

## 2023-02-15 ENCOUNTER — Encounter: Payer: Self-pay | Admitting: *Deleted

## 2023-02-15 ENCOUNTER — Encounter: Payer: Self-pay | Admitting: Obstetrics and Gynecology

## 2023-02-15 VITALS — BP 117/78 | HR 76 | Ht 69.0 in | Wt 276.2 lb

## 2023-02-15 DIAGNOSIS — D259 Leiomyoma of uterus, unspecified: Secondary | ICD-10-CM

## 2023-02-15 DIAGNOSIS — N920 Excessive and frequent menstruation with regular cycle: Secondary | ICD-10-CM

## 2023-02-15 DIAGNOSIS — J4540 Moderate persistent asthma, uncomplicated: Secondary | ICD-10-CM

## 2023-02-15 DIAGNOSIS — Z01818 Encounter for other preprocedural examination: Secondary | ICD-10-CM

## 2023-02-15 DIAGNOSIS — D219 Benign neoplasm of connective and other soft tissue, unspecified: Secondary | ICD-10-CM

## 2023-02-15 MED ORDER — MYFEMBREE 40-1-0.5 MG PO TABS: 1.0000 | ORAL_TABLET | Freq: Every day | ORAL | 2 refills | Status: DC

## 2023-02-15 NOTE — Progress Notes (Signed)
Pulmonary Individual Treatment Plan  Patient Details  Name: Kathy Vega MRN: [id number] Date of Birth: 01-03-1976 Referring Provider:   Flowsheet Row Pulmonary Rehab from 01/26/2023 in [facility] Cardiac and Pulmonary Rehab  Referring Provider Vernard Gambles MD       Initial Encounter Date:  Flowsheet Row Pulmonary Rehab from 01/26/2023 in [facility] Cardiac and Pulmonary Rehab  Date 01/26/23       Visit Diagnosis: Moderate persistent asthma in adult without complication  Patient's Home Medications on Admission:  Current Outpatient Medications:    acetaminophen (TYLENOL) 500 MG tablet, Take 500 mg by mouth every 6 (six) hours as needed for headache, fever, moderate pain or mild pain., Disp: , Rfl:    albuterol (PROVENTIL) (2.5 MG/3ML) 0.083% nebulizer solution, Take 3 mLs (2.5 mg total) by nebulization every 6 (six) hours as needed for wheezing or shortness of breath., Disp: 360 mL, Rfl: 11   ascorbic acid (VITAMIN C) 500 MG tablet, Take 1 tablet (500 mg total) by mouth daily., Disp: 30 tablet, Rfl: 0   budesonide (PULMICORT) 0.5 MG/2ML nebulizer solution, Take 2 mLs (0.5 mg total) by nebulization 2 (two) times daily., Disp: 120 mL, Rfl: 5   formoterol (PERFOROMIST) 20 MCG/2ML nebulizer solution, Take 2 mLs (20 mcg total) by nebulization 2 (two) times daily. Use before the budesonide, twice a day., Disp: 120 mL, Rfl: 11   furosemide (LASIX) 20 MG tablet, Take 1 tablet (20 mg total) by mouth daily as needed for edema (shortness of breath)., Disp: 90 tablet, Rfl: 3   hydrOXYzine (ATARAX) 10 MG tablet, Take 10 mg by mouth 3 (three) times daily., Disp: , Rfl:    iron polysaccharides (NIFEREX) 150 MG capsule, Take 1 capsule (150 mg total) by mouth daily., Disp: 30 capsule, Rfl: 1   levothyroxine (SYNTHROID) 125 MCG tablet, Take 250 mcg by mouth daily., Disp: , Rfl:    nystatin cream (MYCOSTATIN), Apply topically 2 (two) times daily., Disp: 30 g, Rfl: 0   OXYGEN, Inhale into the lungs at bedtime as  needed. Up to 4 liters, Disp: , Rfl:    pantoprazole (PROTONIX) 40 MG tablet, Take 1 tablet (40 mg total) by mouth daily., Disp: 30 tablet, Rfl: 2   propranolol (INDERAL) 10 MG tablet, Take 1 tablet (10 mg total) by mouth 3 (three) times daily as needed (palpitations)., Disp: 90 tablet, Rfl: 6   Relugolix-Estradiol-Norethind (MYFEMBREE) 40-1-0.5 MG TABS, Take 1 tablet by mouth daily., Disp: 60 tablet, Rfl: 2  Past Medical History: Past Medical History:  Diagnosis Date   Acute encephalopathy 08/13/2020   Acute renal failure (ARF) ([place]) 08/13/2020   Anemia    Asthma    Cervical cancer (HCC)    Hypothyroidism    Iron deficiency anemia    Obesity (BMI 30-39.9)    Pneumonia due to COVID-19 virus 08/13/2020   requiring trach and prolonged hospitalization   Subglottic stenosis    Tracheal stenosis    Tracheostomy tube present (HCC)     Tobacco Use: Social History   Tobacco Use  Smoking Status Former   Packs/day: 0.50   Years: 5.00   Additional pack years: 0.00   Total pack years: 2.50   Types: Cigarettes   Quit date: 2000   Years since quitting: 24.2  Smokeless Tobacco Never    Labs: Review Flowsheet  More data exists      Latest Ref Rng & Units 08/16/2020 08/17/2020 08/18/2020 08/20/2020 08/31/2020  Labs for ITP Cardiac and Pulmonary Rehab  Trlycerides <150 mg/dL -  1,906  - - -  PH, Arterial 7.350 - 7.450 7.40  - 7.41  7.38  7.42  7.47   PCO2 arterial 32.0 - 48.0 mmHg 44  - 49  56  48  53   Bicarbonate 20.0 - 28.0 mmol/L 27.3  - 31.1  33.1  31.1  38.6   O2 Saturation % 98.8  - 92.3  94.6  93.1  92.5      Pulmonary Assessment Scores:  Pulmonary Assessment Scores     Row Name 01/26/23 1601         ADL UCSD   ADL Phase Entry     SOB Score total 100     Rest 3     Walk 4     Stairs 5     Bath 3     Dress 5     Shop 5       CAT Score   CAT Score 25       mMRC Score   mMRC Score 4              UCSD: Self-administered rating of dyspnea associated with  activities of daily living (ADLs) 6-point scale (0 = "not at all" to 5 = "maximal or unable to do because of breathlessness")  Scoring Scores range from 0 to 120.  Minimally important difference is 5 units  CAT: CAT can identify the health impairment of COPD patients and is better correlated with disease progression.  CAT has a scoring range of zero to 40. The CAT score is classified into four groups of low (less than 10), medium (10 - 20), high (21-30) and very high (31-40) based on the impact level of disease on health status. A CAT score over 10 suggests significant symptoms.  A worsening CAT score could be explained by an exacerbation, poor medication adherence, poor inhaler technique, or progression of COPD or comorbid conditions.  CAT MCID is 2 points  mMRC: mMRC (Modified Medical Research Council) Dyspnea Scale is used to assess the degree of baseline functional disability in patients of respiratory disease due to dyspnea. No minimal important difference is established. A decrease in score of 1 point or greater is considered a positive change.   Pulmonary Function Assessment:  Pulmonary Function Assessment - 01/16/23 1215       Breath   Shortness of Breath Yes;Limiting activity             Exercise Target Goals: Exercise Program Goal: Individual exercise prescription set using results from initial 6 min walk test and THRR while considering  patient's activity barriers and safety.   Exercise Prescription Goal: Initial exercise prescription builds to 30-45 minutes a day of aerobic activity, 2-3 days per week.  Home exercise guidelines will be given to patient during program as part of exercise prescription that the participant will acknowledge.  Education: Aerobic Exercise: - Group verbal and visual presentation on the components of exercise prescription. Introduces F.I.T.T principle from ACSM for exercise prescriptions.  Reviews F.I.T.T. principles of aerobic exercise  including progression. Written material given at graduation.   Education: Resistance Exercise: - Group verbal and visual presentation on the components of exercise prescription. Introduces F.I.T.T principle from ACSM for exercise prescriptions  Reviews F.I.T.T. principles of resistance exercise including progression. Written material given at graduation.    Education: Exercise & Equipment Safety: - Individual verbal instruction and demonstration of equipment use and safety with use of the equipment. Flowsheet Row Pulmonary Rehab from 01/16/2023 in [facility] Cardiac  and Pulmonary Rehab  Date 01/16/23  Educator [facility]  Instruction Review Code 1- Verbalizes Understanding       Education: Exercise Physiology & General Exercise Guidelines: - Group verbal and written instruction with models to review the exercise physiology of the cardiovascular system and associated critical values. Provides general exercise guidelines with specific guidelines to those with heart or lung disease.    Education: Flexibility, Balance, Mind/Body Relaxation: - Group verbal and visual presentation with interactive activity on the components of exercise prescription. Introduces F.I.T.T principle from ACSM for exercise prescriptions. Reviews F.I.T.T. principles of flexibility and balance exercise training including progression. Also discusses the mind body connection.  Reviews various relaxation techniques to help reduce and manage stress (i.e. Deep breathing, progressive muscle relaxation, and visualization). Balance handout provided to take home. Written material given at graduation.   Activity Barriers & Risk Stratification:  Activity Barriers & Cardiac Risk Stratification - 01/26/23 1617       Activity Barriers & Cardiac Risk Stratification   Activity Barriers Back Problems;Deconditioning;Muscular Weakness;Shortness of Breath;Other (comment)    Comments chronic back pain, bilateral knee pain, tracheostomy in place              6 Minute Walk:  6 Minute Walk     Row Name 01/26/23 1626         6 Minute Walk   Phase Initial     Distance 395 feet     Walk Time 5.1 minutes     # of Rest Breaks 5  0.52-1:01; 1:32-1:43; 2:28-2:41; 3:37-3:43; 4:44-4:57     MPH 0.88     METS 1.94     RPE 13     Perceived Dyspnea  3     VO2 Peak 6.8     Symptoms Yes (comment)     Comments Back pain 7/10, SOB, fatigue     Resting HR 90 bpm     Resting BP 118/74     Resting Oxygen Saturation  99 %     Exercise Oxygen Saturation  during 6 min walk 94 %     Max Ex. HR 114 bpm     Max Ex. BP 132/80     2 Minute Post BP 120/76       Interval HR   1 Minute HR 101     2 Minute HR 110     3 Minute HR 112     4 Minute HR 108     5 Minute HR 114     6 Minute HR 111     2 Minute Post HR 87     Interval Heart Rate? Yes       Interval Oxygen   Interval Oxygen? Yes     Baseline Oxygen Saturation % 99 %     1 Minute Oxygen Saturation % 94 %     1 Minute Liters of Oxygen 4 L  trach collar; 28% Fio2     2 Minute Oxygen Saturation % 98 %     2 Minute Liters of Oxygen 4 L     3 Minute Oxygen Saturation % 99 %     3 Minute Liters of Oxygen 4 L     4 Minute Oxygen Saturation % 99 %     4 Minute Liters of Oxygen 4 L     5 Minute Oxygen Saturation % 97 %     5 Minute Liters of Oxygen 4 L     6 Minute Oxygen Saturation %  96 %     6 Minute Liters of Oxygen 4 L     2 Minute Post Oxygen Saturation % 98 %     2 Minute Post Liters of Oxygen 4 L             Oxygen Initial Assessment:  Oxygen Initial Assessment - 01/26/23 1557       Home Oxygen   Home Oxygen Device E-Tanks;Home Concentrator    Sleep Oxygen Prescription Continuous   Trach Collar   Liters per minute 5    Home Exercise Oxygen Prescription Continuous   28% Fio2- trach collar   Liters per minute 4   28% Fio2 - trach collar   Home Resting Oxygen Prescription Continuous   28% Fio2- trach collar   Liters per minute 4    Compliance with Home Oxygen Use  Yes      Initial 6 min Walk   Oxygen Used Continuous    Liters per minute 4    FiO2% 28   trach collar     Program Oxygen Prescription   Program Oxygen Prescription Continuous    Liters per minute 4    FiO2% 28   trach collar   Comments RT met with patient and reviewed information with patient.      Intervention   Short Term Goals To learn and exhibit compliance with exercise, home and travel O2 prescription;To learn and understand importance of monitoring SPO2 with pulse oximeter and demonstrate accurate use of the pulse oximeter.;To learn and demonstrate proper pursed lip breathing techniques or other breathing techniques. ;To learn and understand importance of maintaining oxygen saturations>88%;To learn and demonstrate proper use of respiratory medications    Long  Term Goals Exhibits compliance with exercise, home  and travel O2 prescription;Maintenance of O2 saturations>88%;Compliance with respiratory medication;Demonstrates proper use of MDI's;Exhibits proper breathing techniques, such as pursed lip breathing or other method taught during program session;Verbalizes importance of monitoring SPO2 with pulse oximeter and return demonstration             Oxygen Re-Evaluation:   Oxygen Discharge (Final Oxygen Re-Evaluation):   Initial Exercise Prescription:  Initial Exercise Prescription - 01/26/23 1600       Date of Initial Exercise RX and Referring Provider   Date 01/26/23    Referring Provider Vernard Gambles MD      Oxygen   Oxygen Continuous    Liters 4   Trach collar; 28% Fio2   Maintain Oxygen Saturation 88% or higher      Recumbant Bike   Level 1    RPM 50    Watts 15    Minutes 15    METs 1.9      T5 Nustep   Level 1    SPM 80    Minutes 15    METs 1.9      Biostep-RELP   Level 1    SPM 50    Minutes 15    METs 1.9      Track   Laps 4   Breaks as needed   Minutes 15    METs 1.1      Prescription Details   Frequency (times per week) 2     Duration Progress to 30 minutes of continuous aerobic without signs/symptoms of physical distress      Intensity   THRR 40-80% of Max Heartrate 123- 156    Ratings of Perceived Exertion 11-13    Perceived Dyspnea 0-4      Progression  Progression Continue to progress workloads to maintain intensity without signs/symptoms of physical distress.      Resistance Training   Training Prescription Yes    Weight 3 lb   no overhead exercises; relayed to patient   Reps 10-15             Perform Capillary Blood Glucose checks as needed.  Exercise Prescription Changes:   Exercise Prescription Changes     Row Name 01/26/23 1600             Response to Exercise   Blood Pressure (Admit) 118/74       Blood Pressure (Exercise) 132/80       Blood Pressure (Exit) 120/76       Heart Rate (Admit) 90 bpm       Heart Rate (Exercise) 114 bpm       Heart Rate (Exit) 87 bpm       Oxygen Saturation (Admit) 99 %       Oxygen Saturation (Exercise) 94 %       Oxygen Saturation (Exit) 98 %       Rating of Perceived Exertion (Exercise) 13       Perceived Dyspnea (Exercise) 3       Symptoms SOB, fatigue, back pain 7/10       Comments walk test results                Exercise Comments:   Exercise Goals and Review:   Exercise Goals     Row Name 01/26/23 1645             Exercise Goals   Increase Physical Activity Yes       Intervention Develop an individualized exercise prescription for aerobic and resistive training based on initial evaluation findings, risk stratification, comorbidities and participant's personal goals.;Provide advice, education, support and counseling about physical activity/exercise needs.       Expected Outcomes Short Term: Attend rehab on a regular basis to increase amount of physical activity.;Long Term: Add in home exercise to make exercise part of routine and to increase amount of physical activity.;Long Term: Exercising regularly at least 3-5 days a  week.       Increase Strength and Stamina Yes       Intervention Provide advice, education, support and counseling about physical activity/exercise needs.       Expected Outcomes Short Term: Perform resistance training exercises routinely during rehab and add in resistance training at home;Long Term: Improve cardiorespiratory fitness, muscular endurance and strength as measured by increased METs and functional capacity (6MWT);Short Term: Increase workloads from initial exercise prescription for resistance, speed, and METs.       Able to understand and use rate of perceived exertion (RPE) scale Yes       Intervention Provide education and explanation on how to use RPE scale       Expected Outcomes Short Term: Able to use RPE daily in rehab to express subjective intensity level;Long Term:  Able to use RPE to guide intensity level when exercising independently       Able to understand and use Dyspnea scale Yes       Intervention Provide education and explanation on how to use Dyspnea scale       Expected Outcomes Long Term: Able to use Dyspnea scale to guide intensity level when exercising independently;Short Term: Able to use Dyspnea scale daily in rehab to express subjective sense of shortness of breath during exertion  Knowledge and understanding of Target Heart Rate Range (THRR) Yes       Intervention Provide education and explanation of THRR including how the numbers were predicted and where they are located for reference       Expected Outcomes Short Term: Able to state/look up THRR;Long Term: Able to use THRR to govern intensity when exercising independently;Short Term: Able to use daily as guideline for intensity in rehab       Able to check pulse independently Yes       Intervention Provide education and demonstration on how to check pulse in carotid and radial arteries.;Review the importance of being able to check your own pulse for safety during independent exercise       Expected  Outcomes Long Term: Able to check pulse independently and accurately;Short Term: Able to explain why pulse checking is important during independent exercise       Understanding of Exercise Prescription Yes       Intervention Provide education, explanation, and written materials on patient's individual exercise prescription       Expected Outcomes Short Term: Able to explain program exercise prescription;Long Term: Able to explain home exercise prescription to exercise independently                Exercise Goals Re-Evaluation :   Discharge Exercise Prescription (Final Exercise Prescription Changes):  Exercise Prescription Changes - 01/26/23 1600       Response to Exercise   Blood Pressure (Admit) 118/74    Blood Pressure (Exercise) 132/80    Blood Pressure (Exit) 120/76    Heart Rate (Admit) 90 bpm    Heart Rate (Exercise) 114 bpm    Heart Rate (Exit) 87 bpm    Oxygen Saturation (Admit) 99 %    Oxygen Saturation (Exercise) 94 %    Oxygen Saturation (Exit) 98 %    Rating of Perceived Exertion (Exercise) 13    Perceived Dyspnea (Exercise) 3    Symptoms SOB, fatigue, back pain 7/10    Comments walk test results             Nutrition:  Target Goals: Understanding of nutrition guidelines, daily intake of sodium 1500mg , cholesterol 200mg , calories 30% from fat and 7% or less from saturated fats, daily to have 5 or more servings of fruits and vegetables.  Education: All About Nutrition: -Group instruction provided by verbal, written material, interactive activities, discussions, models, and posters to present general guidelines for heart healthy nutrition including fat, fiber, MyPlate, the role of sodium in heart healthy nutrition, utilization of the nutrition label, and utilization of this knowledge for meal planning. Follow up email sent as well. Written material given at graduation.   Biometrics:  Pre Biometrics - 01/26/23 1624       Pre Biometrics   Height 5\' 9"   (1.753 m)    Weight 275 lb 4.8 oz (124.9 kg)    Waist Circumference 51 inches    Hip Circumference 52.5 inches    Waist to Hip Ratio 0.97 %    BMI (Calculated) 40.64    Single Leg Stand 14.2 seconds              Nutrition Therapy Plan and Nutrition Goals:  Nutrition Therapy & Goals - 01/26/23 1603       Nutrition Therapy   RD appointment deferred Yes   Deferred 01/26/23            Nutrition Assessments:  MEDIFICTS Score Key: ?70 Need to make dietary  changes  40-70 Heart Healthy Diet ? 40 Therapeutic Level Cholesterol Diet  Flowsheet Row Pulmonary Rehab from 01/26/2023 in [facility] Cardiac and Pulmonary Rehab  Picture Your Plate Total Score on Admission 47      Picture Your Plate Scores: [id number] Unhealthy dietary pattern with much room for improvement. 41-50 Dietary pattern unlikely to meet recommendations for good health and room for improvement. 51-60 More healthful dietary pattern, with some room for improvement.  >60 Healthy dietary pattern, although there may be some specific behaviors that could be improved.   Nutrition Goals Re-Evaluation:   Nutrition Goals Discharge (Final Nutrition Goals Re-Evaluation):   Psychosocial: Target Goals: Acknowledge presence or absence of significant depression and/or stress, maximize coping skills, provide positive support system. Participant is able to verbalize types and ability to use techniques and skills needed for reducing stress and depression.   Education: Stress, Anxiety, and Depression - Group verbal and visual presentation to define topics covered.  Reviews how body is impacted by stress, anxiety, and depression.  Also discusses healthy ways to reduce stress and to treat/manage anxiety and depression.  Written material given at graduation.   Education: Sleep Hygiene -Provides group verbal and written instruction about how sleep can affect your health.  Define sleep hygiene, discuss sleep cycles and impact of sleep  habits. Review good sleep hygiene tips.    Initial Review & Psychosocial Screening:  Initial Psych Review & Screening - 01/16/23 1217       Initial Review   Current issues with Current Stress Concerns    Source of Stress Concerns Chronic Illness    Comments Patient states that she has her kids and husband for support. Other parts of her family can be caled for support but not often. She wants to get her health back on track and breath better.      Family Dynamics   Good Support System? Yes    Comments Short: Start [facility] to help with mood. Long: Maintain a healthy mental state.      Screening Interventions   Interventions Encouraged to exercise;To provide support and resources with identified psychosocial needs;Provide feedback about the scores to participant    Expected Outcomes Short Term goal: Utilizing psychosocial counselor, staff and physician to assist with identification of specific Stressors or current issues interfering with healing process. Setting desired goal for each stressor or current issue identified.;Long Term Goal: Stressors or current issues are controlled or eliminated.;Short Term goal: Identification and review with participant of any Quality of Life or Depression concerns found by scoring the questionnaire.;Long Term goal: The participant improves quality of Life and PHQ9 Scores as seen by post scores and/or verbalization of changes             Quality of Life Scores:  Scores of 19 and below usually indicate a poorer quality of life in these areas.  A difference of  2-3 points is a clinically meaningful difference.  A difference of 2-3 points in the total score of the Quality of Life Index has been associated with significant improvement in overall quality of life, self-image, physical symptoms, and general health in studies assessing change in quality of life.  PHQ-9: Review Flowsheet       01/26/2023  Depression screen PHQ 2/9  Decreased Interest 3   Down, Depressed, Hopeless 1  PHQ - 2 Score 4  Altered sleeping 0  Tired, decreased energy 3  Change in appetite 0  Feeling bad or failure about yourself  0  Trouble concentrating  0  Moving slowly or fidgety/restless 0  Suicidal thoughts 0  PHQ-9 Score 7  Difficult doing work/chores Somewhat difficult   Interpretation of Total Score  Total Score Depression Severity:  1-4 = Minimal depression, 5-9 = Mild depression, 10-14 = Moderate depression, 15-19 = Moderately severe depression, 20-27 = Severe depression   Psychosocial Evaluation and Intervention:  Psychosocial Evaluation - 01/16/23 1220       Psychosocial Evaluation & Interventions   Interventions Stress management education;Relaxation education;Encouraged to exercise with the program and follow exercise prescription    Comments Patient states that she has her kids and husband for support. Other parts of her family can be caled for support but not often. She wants to get her health back on track and breath better.    Expected Outcomes Short: Start [facility] to help with mood. Long: Maintain a healthy mental state.    Continue Psychosocial Services  Follow up required by staff             Psychosocial Re-Evaluation:   Psychosocial Discharge (Final Psychosocial Re-Evaluation):   Education: Education Goals: Education classes will be provided on a weekly basis, covering required topics. Participant will state understanding/return demonstration of topics presented.  Learning Barriers/Preferences:  Learning Barriers/Preferences - 01/16/23 1215       Learning Barriers/Preferences   Learning Barriers None    Learning Preferences None             General Pulmonary Education Topics:  Infection Prevention: - Provides verbal and written material to individual with discussion of infection control including proper hand washing and proper equipment cleaning during exercise session. Flowsheet Row Pulmonary Rehab from  01/16/2023 in [facility] Cardiac and Pulmonary Rehab  Date 01/16/23  Educator [facility]  Instruction Review Code 1- Verbalizes Understanding       Falls Prevention: - Provides verbal and written material to individual with discussion of falls prevention and safety. Flowsheet Row Pulmonary Rehab from 01/16/2023 in [facility] Cardiac and Pulmonary Rehab  Date 01/16/23  Educator [facility]  Instruction Review Code 1- Verbalizes Understanding       Chronic Lung Disease Review: - Group verbal instruction with posters, models, PowerPoint presentations and videos,  to review new updates, new respiratory medications, new advancements in procedures and treatments. Providing information on websites and "800" numbers for continued self-education. Includes information about supplement oxygen, available portable oxygen systems, continuous and intermittent flow rates, oxygen safety, concentrators, and Medicare reimbursement for oxygen. Explanation of Pulmonary Drugs, including class, frequency, complications, importance of spacers, rinsing mouth after steroid MDI's, and proper cleaning methods for nebulizers. Review of basic lung anatomy and physiology related to function, structure, and complications of lung disease. Review of risk factors. Discussion about methods for diagnosing sleep apnea and types of masks and machines for OSA. Includes a review of the use of types of environmental controls: home humidity, furnaces, filters, dust mite/pet prevention, HEPA vacuums. Discussion about weather changes, air quality and the benefits of nasal washing. Instruction on Warning signs, infection symptoms, calling MD promptly, preventive modes, and value of vaccinations. Review of effective airway clearance, coughing and/or vibration techniques. Emphasizing that all should Create an Action Plan. Written material given at graduation. Flowsheet Row Pulmonary Rehab from 01/26/2023 in [facility]  Education need identified  01/26/23       AED/CPR: - Group verbal and written instruction with the use of models to demonstrate the basic use of the AED with the basic ABC's of resuscitation.    Anatomy  and Cardiac Procedures: - Group verbal and visual presentation and models provide information about basic cardiac anatomy and function. Reviews the testing methods done to diagnose heart disease and the outcomes of the test results. Describes the treatment choices: Medical Management, Angioplasty, or Coronary Bypass Surgery for treating various heart conditions including Myocardial Infarction, Angina, Valve Disease, and Cardiac Arrhythmias.  Written material given at graduation.   Medication Safety: - Group verbal and visual instruction to review commonly prescribed medications for heart and lung disease. Reviews the medication, class of the drug, and side effects. Includes the steps to properly store meds and maintain the prescription regimen.  Written material given at graduation.   Other: -Provides group and verbal instruction on various topics (see comments)   Knowledge Questionnaire Score:  Knowledge Questionnaire Score - 01/26/23 1557       Knowledge Questionnaire Score   Pre Score 15/18              Core Components/Risk Factors/Patient Goals at Admission:  Personal Goals and Risk Factors at Admission - 01/26/23 1646       Core Components/Risk Factors/Patient Goals on Admission    Weight Management Yes;Weight Loss;Obesity    Intervention Weight Management: Develop a combined nutrition and exercise program designed to reach desired caloric intake, while maintaining appropriate intake of nutrient and fiber, sodium and fats, and appropriate energy expenditure required for the weight goal.;Weight Management: Provide education and appropriate resources to help participant work on and attain dietary goals.;Weight Management/Obesity: Establish reasonable short term and long term weight goals.;Obesity:  Provide education and appropriate resources to help participant work on and attain dietary goals.    Admit Weight 275 lb (124.7 kg)    Goal Weight: Short Term 270 lb (122.5 kg)    Goal Weight: Long Term 225 lb (102.1 kg)    Expected Outcomes Short Term: Continue to assess and modify interventions until short term weight is achieved;Long Term: Adherence to nutrition and physical activity/exercise program aimed toward attainment of established weight goal;Weight Loss: Understanding of general recommendations for a balanced deficit meal plan, which promotes 1-2 lb weight loss per week and includes a negative energy balance of [phone] kcal/d;Understanding recommendations for meals to include 15-35% energy as protein, 25-35% energy from fat, 35-60% energy from carbohydrates, less than 200mg  of dietary cholesterol, 20-35 gm of total fiber daily;Understanding of distribution of calorie intake throughout the day with the consumption of 4-5 meals/snacks    Improve shortness of breath with ADL's Yes    Intervention Provide education, individualized exercise plan and daily activity instruction to help decrease symptoms of SOB with activities of daily living.    Expected Outcomes Short Term: Improve cardiorespiratory fitness to achieve a reduction of symptoms when performing ADLs;Long Term: Be able to perform more ADLs without symptoms or delay the onset of symptoms    Increase knowledge of respiratory medications and ability to use respiratory devices properly  Yes    Intervention Provide education and demonstration as needed of appropriate use of medications, inhalers, and oxygen therapy.    Expected Outcomes Short Term: Achieves understanding of medications use. Understands that oxygen is a medication prescribed by physician. Demonstrates appropriate use of inhaler and oxygen therapy.;Long Term: Maintain appropriate use of medications, inhalers, and oxygen therapy.             Education:Diabetes -  Individual verbal and written instruction to review signs/symptoms of diabetes, desired ranges of glucose level fasting, after meals and with exercise. Acknowledge that pre and  post exercise glucose checks will be done for 3 sessions at entry of program.   Know Your Numbers and Heart Failure: - Group verbal and visual instruction to discuss disease risk factors for cardiac and pulmonary disease and treatment options.  Reviews associated critical values for Overweight/Obesity, Hypertension, Cholesterol, and Diabetes.  Discusses basics of heart failure: signs/symptoms and treatments.  Introduces Heart Failure Zone chart for action plan for heart failure.  Written material given at graduation.   Core Components/Risk Factors/Patient Goals Review:    Core Components/Risk Factors/Patient Goals at Discharge (Final Review):    ITP Comments:  ITP Comments     Row Name 01/16/23 1212 01/26/23 1553 02/15/23 0932       ITP Comments Virtual Visit completed. Patient informed on EP and RD appointment and 6 Minute walk test. Patient also informed of patient health questionnaires on My Chart. Patient Verbalizes understanding. Visit diagnosis can be found in [facility] 12/27/2022. Completed 6MWT and gym orientation. Initial ITP created and sent for review to Dr. Ottie Glazier,  Medical Director. 30 Day review completed. Medical Director ITP review done, changes made as directed, and signed approval by Medical Director.    new to program              Comments:

## 2023-02-15 NOTE — Progress Notes (Signed)
HPI:      [name] is a 47 y.o. [phone] who LMP was Patient's last menstrual period was 01/06/2023 (approximate).  Subjective:   She presents today stating that she is not sure if she wants an IUD because she has talked to some of her friends and they do not recommend it.  She is open to considering other methods of cycle control.  She is getting iron infusions for a low hemoglobin and because her menstrual bleeding is so heavy.  She reports that she has regular cycles but they are very heavy.    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 She does not have any pertinent problems on file. She  has a past surgical history that includes Leg Surgery; Cholecystectomy; Tracheostomy tube placement (N/A, 09/04/2020); Cervical biopsy w/ loop electrode excision; Cesarean section; Cardiac catheterization; Laryngoscopy (04/16/2021); Laryngoscopy (02/25/2022); Laryngoscopy (08/13/2021); and Tubal ligation. Her family history includes Heart disease in her father; Hyperlipidemia in her mother; Hypertension in her mother; Liver cancer in her father. She  reports that she quit smoking about 24 years ago. Her smoking use included cigarettes. She has a 2.50 pack-year smoking history. She has never used smokeless tobacco. She reports that she does not drink alcohol and does not use drugs. She has a current medication list which includes the following prescription(s): acetaminophen, albuterol, ascorbic acid, budesonide, formoterol, furosemide, hydroxyzine, iron polysaccharides, levothyroxine, nystatin cream,  oxygen-helium, pantoprazole, propranolol, and myfembree. She has No Known Allergies.       Review of Systems:  Review of Systems  Constitutional: Denied constitutional symptoms, night sweats, recent illness, fatigue, fever, insomnia and weight loss.  Eyes: Denied eye symptoms, eye pain, photophobia, vision change and visual disturbance.  Ears/Nose/Throat/Neck: Denied ear, nose, throat or neck symptoms, hearing loss, nasal discharge, sinus congestion and sore throat.  Cardiovascular: Denied cardiovascular symptoms, arrhythmia, chest pain/pressure, edema, exercise intolerance, orthopnea and palpitations.  Respiratory: Denied pulmonary symptoms, asthma, pleuritic pain, productive sputum, cough, dyspnea and wheezing.  Gastrointestinal: Denied, gastro-esophageal reflux, melena, nausea and vomiting.  Genitourinary: See HPI for additional information.  Musculoskeletal: Denied musculoskeletal symptoms, stiffness, swelling, muscle weakness and myalgia.  Dermatologic: Denied dermatology symptoms, rash and scar.  Neurologic: Denied neurology symptoms, dizziness, headache, neck pain and syncope.  Psychiatric: Denied psychiatric symptoms, anxiety and depression.  Endocrine: Denied endocrine symptoms including hot flashes and night sweats.   Meds:   Current Outpatient Medications on File Prior to Visit  Medication Sig Dispense Refill   acetaminophen (TYLENOL) 500 MG tablet Take 500 mg by mouth every 6 (six) hours as needed for headache, fever, moderate pain or mild pain.     albuterol (PROVENTIL) (2.5 MG/3ML) 0.083% nebulizer solution Take 3 mLs (2.5 mg total) by nebulization every 6 (six) hours as needed for wheezing or shortness of breath. 360 mL 11   ascorbic acid (VITAMIN C) 500 MG tablet Take 1 tablet (500 mg total) by mouth daily. 30 tablet 0   budesonide (PULMICORT) 0.5 MG/2ML nebulizer solution Take 2 mLs (0.5 mg total) by nebulization 2 (two) times daily. 120 mL 5   formoterol (PERFOROMIST) 20  MCG/2ML nebulizer solution  Take 2 mLs (20 mcg total) by nebulization 2 (two) times daily. Use before the budesonide, twice a day. 120 mL 11   furosemide (LASIX) 20 MG tablet Take 1 tablet (20 mg total) by mouth daily as needed for edema (shortness of breath). 90 tablet 3   hydrOXYzine (ATARAX) 10 MG tablet Take 10 mg by mouth 3 (three) times daily.     iron polysaccharides (NIFEREX) 150 MG capsule Take 1 capsule (150 mg total) by mouth daily. 30 capsule 1   levothyroxine (SYNTHROID) 125 MCG tablet Take 250 mcg by mouth daily.     nystatin cream (MYCOSTATIN) Apply topically 2 (two) times daily. 30 g 0   OXYGEN Inhale into the lungs at bedtime as needed. Up to 4 liters     pantoprazole (PROTONIX) 40 MG tablet Take 1 tablet (40 mg total) by mouth daily. 30 tablet 2   propranolol (INDERAL) 10 MG tablet Take 1 tablet (10 mg total) by mouth 3 (three) times daily as needed (palpitations). 90 tablet 6   No current facility-administered medications on file prior to visit.      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 Menorrhagia with regular cycle   2. Fibroids     Her biggest issue is that it is very difficult to recognize her cervix and manipulated or do anything like an endometrial biopsy or placed an IUD.  We have found this to be impossible in the office. I have discussed with her the need for endometrial biopsy and the difficulty in getting this performed.  She has stated that she would like to try 1 more thing to control her bleeding and if this does not work she will consider OR endometrial biopsy with possible IUD placement concurrently.   Plan:            1.  Patient will try Myfembree for heavy regular cycles.  (Patient has tubal ligation for birth control).  2.  Should she fail Myfembree would consider endometrial biopsy in the OR with possible IUD placement.  3.  Possible referral for UFE Orders No orders of the defined types were placed in this encounter.    Meds ordered this encounter  Medications   Relugolix-Estradiol-Norethind (MYFEMBREE) 40-1-0.5 MG TABS    Sig: Take 1 tablet by mouth daily.    Dispense:  60 tablet    Refill:  2      F/U  Return in about 4 months (around 06/17/2023). I spent 22 minutes involved in the care of this patient preparing to see the patient by obtaining and reviewing her medical history (including labs, imaging tests and prior procedures), documenting clinical information in the electronic health record (EHR), counseling and coordinating care plans, writing and sending prescriptions, ordering tests or procedures and in direct communicating with the patient and medical staff discussing pertinent items from her history and physical exam.  Finis Bud, M.D. 02/15/2023 9:31 AM

## 2023-02-15 NOTE — Progress Notes (Signed)
Patient presents today for a pre-op exam prior to IUD insertion in th OR. She states no additional concerns today. She states continually to have lengthy heavy cycles but is hesitant about an IUD. She states questions regarding other possible options.

## 2023-02-16 MED FILL — Iron Sucrose Inj 20 MG/ML (Fe Equiv): INTRAVENOUS | Qty: 10 | Status: AC

## 2023-02-17 ENCOUNTER — Inpatient Hospital Stay: Payer: Medicaid Other

## 2023-02-17 VITALS — BP 121/77 | HR 82 | Temp 97.4°F | Resp 17

## 2023-02-17 DIAGNOSIS — D509 Iron deficiency anemia, unspecified: Secondary | ICD-10-CM | POA: Diagnosis not present

## 2023-02-17 DIAGNOSIS — E611 Iron deficiency: Secondary | ICD-10-CM

## 2023-02-17 MED ORDER — SODIUM CHLORIDE 0.9 % IV SOLN
200.0000 mg | Freq: Once | INTRAVENOUS | Status: AC
  Administered 2023-02-17: 200 mg via INTRAVENOUS
  Filled 2023-02-17: qty 200

## 2023-02-17 MED ORDER — SODIUM CHLORIDE 0.9 % IV SOLN
Freq: Once | INTRAVENOUS | Status: AC
  Filled 2023-02-17: qty 250

## 2023-03-13 ENCOUNTER — Encounter: Payer: 59 | Attending: Pulmonary Disease | Admitting: *Deleted

## 2023-03-13 DIAGNOSIS — J4540 Moderate persistent asthma, uncomplicated: Secondary | ICD-10-CM | POA: Diagnosis present

## 2023-03-13 NOTE — Progress Notes (Signed)
Daily Session Note  Patient Details  Name: Kathy Vega MRN: [id number] Date of Birth: July 09, 1976 Referring Provider:   Flowsheet Row Pulmonary Rehab from 01/26/2023 in [facility] Cardiac [facility] Pulmonary Rehab  Referring Provider Sarina Ser MD       Encounter Date: 03/13/2023  Check In:  Session Check In - 03/13/23 1359       Check-In   Supervising physician immediately available to respond to emergencies See telemetry face sheet for immediately available ER MD    Location [facility]-Cardiac & Pulmonary Rehab    Staff Present Susann Givens, RN BSN;Joseph Reino Kent, Guinevere Ferrari, RN, [place]    Virtual Visit No    Medication changes reported     No    Fall or balance concerns reported    No    Warm-up and Cool-down Performed on first and last piece of equipment    Resistance Training Performed Yes    VAD Patient? No    PAD/SET Patient? No      Pain Assessment   Currently in Pain? No/denies                Social History   Tobacco Use  Smoking Status Former   Packs/day: 0.50   Years: 5.00   Additional pack years: 0.00   Total pack years: 2.50   Types: Cigarettes   Quit date: 2000   Years since quitting: 24.3  Smokeless Tobacco Never    Goals Met:  Independence with exercise equipment Exercise tolerated well No report of concerns or symptoms today Strength training completed today  Goals Unmet:  Not Applicable  Comments: First full day of exercise!  Patient was oriented to gym and equipment including functions, settings, policies, and procedures.  Patient's individual exercise prescription and treatment plan were reviewed.  All starting workloads were established based on the results of the 6 minute walk test done at initial orientation visit.  The plan for exercise progression was also introduced and progression will be customized based on patient's performance and goals.    Dr. Bethann Punches is Medical Director for [facility] Cardiac Rehabilitation.  Dr.  Vida Rigger is Medical Director for [facility] Pulmonary Rehabilitation.

## 2023-03-15 ENCOUNTER — Encounter: Payer: Self-pay | Admitting: *Deleted

## 2023-03-15 DIAGNOSIS — J4540 Moderate persistent asthma, uncomplicated: Secondary | ICD-10-CM

## 2023-03-15 NOTE — Progress Notes (Signed)
Pulmonary Individual Treatment Plan  Patient Details  Name: CYNTHA BRICKMAN MRN: [id number] Date of Birth: 02-16-1976 Referring Provider:   Flowsheet Row Pulmonary Rehab from 01/26/2023 in [facility] Cardiac and Pulmonary Rehab  Referring Provider Sarina Ser MD       Initial Encounter Date:  Flowsheet Row Pulmonary Rehab from 01/26/2023 in [facility] Cardiac and Pulmonary Rehab  Date 01/26/23       Visit Diagnosis: Moderate persistent asthma in adult without complication  Patient's Home Medications on Admission:  Current Outpatient Medications:    acetaminophen (TYLENOL) 500 MG tablet, Take 500 mg by mouth every 6 (six) hours as needed for headache, fever, moderate pain or mild pain., Disp: , Rfl:    albuterol (PROVENTIL) (2.5 MG/3ML) 0.083% nebulizer solution, Take 3 mLs (2.5 mg total) by nebulization every 6 (six) hours as needed for wheezing or shortness of breath., Disp: 360 mL, Rfl: 11   ascorbic acid (VITAMIN C) 500 MG tablet, Take 1 tablet (500 mg total) by mouth daily., Disp: 30 tablet, Rfl: 0   budesonide (PULMICORT) 0.5 MG/2ML nebulizer solution, Take 2 mLs (0.5 mg total) by nebulization 2 (two) times daily., Disp: 120 mL, Rfl: 5   formoterol (PERFOROMIST) 20 MCG/2ML nebulizer solution, Take 2 mLs (20 mcg total) by nebulization 2 (two) times daily. Use before the budesonide, twice a day., Disp: 120 mL, Rfl: 11   furosemide (LASIX) 20 MG tablet, Take 1 tablet (20 mg total) by mouth daily as needed for edema (shortness of breath)., Disp: 90 tablet, Rfl: 3   hydrOXYzine (ATARAX) 10 MG tablet, Take 10 mg by mouth 3 (three) times daily., Disp: , Rfl:    iron polysaccharides (NIFEREX) 150 MG capsule, Take 1 capsule (150 mg total) by mouth daily., Disp: 30 capsule, Rfl: 1   levothyroxine (SYNTHROID) 125 MCG tablet, Take 250 mcg by mouth daily., Disp: , Rfl:    nystatin cream (MYCOSTATIN), Apply topically 2 (two) times daily., Disp: 30 g, Rfl: 0   OXYGEN, Inhale into the lungs at bedtime as  needed. Up to 4 liters, Disp: , Rfl:    pantoprazole (PROTONIX) 40 MG tablet, Take 1 tablet (40 mg total) by mouth daily., Disp: 30 tablet, Rfl: 2   propranolol (INDERAL) 10 MG tablet, Take 1 tablet (10 mg total) by mouth 3 (three) times daily as needed (palpitations)., Disp: 90 tablet, Rfl: 6   Relugolix-Estradiol-Norethind (MYFEMBREE) 40-1-0.5 MG TABS, Take 1 tablet by mouth daily., Disp: 60 tablet, Rfl: 2  Past Medical History: Past Medical History:  Diagnosis Date   Acute encephalopathy 08/13/2020   Acute renal failure (ARF) (HCC) 08/13/2020   Anemia    Asthma    Cervical cancer (HCC)    Hypothyroidism    Iron deficiency anemia    Obesity (BMI 30-39.9)    Pneumonia due to COVID-19 virus 08/13/2020   requiring trach and prolonged hospitalization   Subglottic stenosis    Tracheal stenosis    Tracheostomy tube present (HCC)     Tobacco Use: Social History   Tobacco Use  Smoking Status Former   Packs/day: 0.50   Years: 5.00   Additional pack years: 0.00   Total pack years: 2.50   Types: Cigarettes   Quit date: 2000   Years since quitting: 24.3  Smokeless Tobacco Never    Labs: Review Flowsheet  More data exists      Latest Ref Rng & Units 08/16/2020 08/17/2020 08/18/2020 08/20/2020 08/31/2020  Labs for ITP Cardiac and Pulmonary Rehab  Trlycerides <150 mg/dL -  1,906  - - -  PH, Arterial 7.350 - 7.450 7.40  - 7.41  7.38  7.42  7.47   PCO2 arterial 32.0 - 48.0 mmHg 44  - 49  56  48  53   Bicarbonate 20.0 - 28.0 mmol/L 27.3  - 31.1  33.1  31.1  38.6   O2 Saturation % 98.8  - 92.3  94.6  93.1  92.5      Pulmonary Assessment Scores:  Pulmonary Assessment Scores     Row Name 01/26/23 1601         ADL UCSD   ADL Phase Entry     SOB Score total 100     Rest 3     Walk 4     Stairs 5     Bath 3     Dress 5     Shop 5       CAT Score   CAT Score 25       mMRC Score   mMRC Score 4              UCSD: Self-administered rating of dyspnea associated with  activities of daily living (ADLs) 6-point scale (0 = "not at all" to 5 = "maximal or unable to do because of breathlessness")  Scoring Scores range from 0 to 120.  Minimally important difference is 5 units  CAT: CAT can identify the health impairment of COPD patients and is better correlated with disease progression.  CAT has a scoring range of zero to 40. The CAT score is classified into four groups of low (less than 10), medium (10 - 20), high (21-30) and very high (31-40) based on the impact level of disease on health status. A CAT score over 10 suggests significant symptoms.  A worsening CAT score could be explained by an exacerbation, poor medication adherence, poor inhaler technique, or progression of COPD or comorbid conditions.  CAT MCID is 2 points  mMRC: mMRC (Modified Medical Research Council) Dyspnea Scale is used to assess the degree of baseline functional disability in patients of respiratory disease due to dyspnea. No minimal important difference is established. A decrease in score of 1 point or greater is considered a positive change.   Pulmonary Function Assessment:  Pulmonary Function Assessment - 01/16/23 1215       Breath   Shortness of Breath Yes;Limiting activity             Exercise Target Goals: Exercise Program Goal: Individual exercise prescription set using results from initial 6 min walk test and THRR while considering  patient's activity barriers and safety.   Exercise Prescription Goal: Initial exercise prescription builds to 30-45 minutes a day of aerobic activity, 2-3 days per week.  Home exercise guidelines will be given to patient during program as part of exercise prescription that the participant will acknowledge.  Education: Aerobic Exercise: - Group verbal and visual presentation on the components of exercise prescription. Introduces F.I.T.T principle from ACSM for exercise prescriptions.  Reviews F.I.T.T. principles of aerobic exercise  including progression. Written material given at graduation.   Education: Resistance Exercise: - Group verbal and visual presentation on the components of exercise prescription. Introduces F.I.T.T principle from ACSM for exercise prescriptions  Reviews F.I.T.T. principles of resistance exercise including progression. Written material given at graduation.    Education: Exercise & Equipment Safety: - Individual verbal instruction and demonstration of equipment use and safety with use of the equipment. Flowsheet Row Pulmonary Rehab from 01/16/2023 in [facility] Cardiac  and Pulmonary Rehab  Date 01/16/23  Educator [facility]  Instruction Review Code 1- Verbalizes Understanding       Education: Exercise Physiology & General Exercise Guidelines: - Group verbal and written instruction with models to review the exercise physiology of the cardiovascular system and associated critical values. Provides general exercise guidelines with specific guidelines to those with heart or lung disease.    Education: Flexibility, Balance, Mind/Body Relaxation: - Group verbal and visual presentation with interactive activity on the components of exercise prescription. Introduces F.I.T.T principle from ACSM for exercise prescriptions. Reviews F.I.T.T. principles of flexibility and balance exercise training including progression. Also discusses the mind body connection.  Reviews various relaxation techniques to help reduce and manage stress (i.e. Deep breathing, progressive muscle relaxation, and visualization). Balance handout provided to take home. Written material given at graduation.   Activity Barriers & Risk Stratification:  Activity Barriers & Cardiac Risk Stratification - 01/26/23 1617       Activity Barriers & Cardiac Risk Stratification   Activity Barriers Back Problems;Deconditioning;Muscular Weakness;Shortness of Breath;Other (comment)    Comments chronic back pain, bilateral knee pain, tracheostomy in place              6 Minute Walk:  6 Minute Walk     Row Name 01/26/23 1626         6 Minute Walk   Phase Initial     Distance 395 feet     Walk Time 5.1 minutes     # of Rest Breaks 5  0.52-1:01; 1:32-1:43; 2:28-2:41; 3:37-3:43; 4:44-4:57     MPH 0.88     METS 1.94     RPE 13     Perceived Dyspnea  3     VO2 Peak 6.8     Symptoms Yes (comment)     Comments Back pain 7/10, SOB, fatigue     Resting HR 90 bpm     Resting BP 118/74     Resting Oxygen Saturation  99 %     Exercise Oxygen Saturation  during 6 min walk 94 %     Max Ex. HR 114 bpm     Max Ex. BP 132/80     2 Minute Post BP 120/76       Interval HR   1 Minute HR 101     2 Minute HR 110     3 Minute HR 112     4 Minute HR 108     5 Minute HR 114     6 Minute HR 111     2 Minute Post HR 87     Interval Heart Rate? Yes       Interval Oxygen   Interval Oxygen? Yes     Baseline Oxygen Saturation % 99 %     1 Minute Oxygen Saturation % 94 %     1 Minute Liters of Oxygen 4 L  trach collar; 28% Fio2     2 Minute Oxygen Saturation % 98 %     2 Minute Liters of Oxygen 4 L     3 Minute Oxygen Saturation % 99 %     3 Minute Liters of Oxygen 4 L     4 Minute Oxygen Saturation % 99 %     4 Minute Liters of Oxygen 4 L     5 Minute Oxygen Saturation % 97 %     5 Minute Liters of Oxygen 4 L     6 Minute Oxygen Saturation %  96 %     6 Minute Liters of Oxygen 4 L     2 Minute Post Oxygen Saturation % 98 %     2 Minute Post Liters of Oxygen 4 L             Oxygen Initial Assessment:  Oxygen Initial Assessment - 01/26/23 1557       Home Oxygen   Home Oxygen Device E-Tanks;Home Concentrator    Sleep Oxygen Prescription Continuous   Trach Collar   Liters per minute 5    Home Exercise Oxygen Prescription Continuous   28% Fio2- trach collar   Liters per minute 4   28% Fio2 - trach collar   Home Resting Oxygen Prescription Continuous   28% Fio2- trach collar   Liters per minute 4    Compliance with Home Oxygen Use  Yes      Initial 6 min Walk   Oxygen Used Continuous    Liters per minute 4    FiO2% 28   trach collar     Program Oxygen Prescription   Program Oxygen Prescription Continuous    Liters per minute 4    FiO2% 28   trach collar   Comments RT met with patient and reviewed information with patient.      Intervention   Short Term Goals To learn and exhibit compliance with exercise, home and travel O2 prescription;To learn and understand importance of monitoring SPO2 with pulse oximeter and demonstrate accurate use of the pulse oximeter.;To learn and demonstrate proper pursed lip breathing techniques or other breathing techniques. ;To learn and understand importance of maintaining oxygen saturations>88%;To learn and demonstrate proper use of respiratory medications    Long  Term Goals Exhibits compliance with exercise, home  and travel O2 prescription;Maintenance of O2 saturations>88%;Compliance with respiratory medication;Demonstrates proper use of MDI's;Exhibits proper breathing techniques, such as pursed lip breathing or other method taught during program session;Verbalizes importance of monitoring SPO2 with pulse oximeter and return demonstration             Oxygen Re-Evaluation:  Oxygen Re-Evaluation     Row Name 03/13/23 1400             Goals/Expected Outcomes   Short Term Goals To learn and exhibit compliance with exercise, home and travel O2 prescription;To learn and understand importance of monitoring SPO2 with pulse oximeter and demonstrate accurate use of the pulse oximeter.;To learn and demonstrate proper pursed lip breathing techniques or other breathing techniques. ;To learn and understand importance of maintaining oxygen saturations>88%;To learn and demonstrate proper use of respiratory medications       Long  Term Goals Exhibits compliance with exercise, home  and travel O2 prescription;Maintenance of O2 saturations>88%;Compliance with respiratory medication;Demonstrates  proper use of MDI's;Exhibits proper breathing techniques, such as pursed lip breathing or other method taught during program session;Verbalizes importance of monitoring SPO2 with pulse oximeter and return demonstration       Comments Reviewed PLB technique with pt.  Talked about how it works and it's importance in maintaining their exercise saturations.       Goals/Expected Outcomes Short: Become more profiecient at using PLB.   Long: Become independent at using PLB.                Oxygen Discharge (Final Oxygen Re-Evaluation):  Oxygen Re-Evaluation - 03/13/23 1400       Goals/Expected Outcomes   Short Term Goals To learn and exhibit compliance with exercise, home and travel O2 prescription;To learn  and understand importance of monitoring SPO2 with pulse oximeter and demonstrate accurate use of the pulse oximeter.;To learn and demonstrate proper pursed lip breathing techniques or other breathing techniques. ;To learn and understand importance of maintaining oxygen saturations>88%;To learn and demonstrate proper use of respiratory medications    Long  Term Goals Exhibits compliance with exercise, home  and travel O2 prescription;Maintenance of O2 saturations>88%;Compliance with respiratory medication;Demonstrates proper use of MDI's;Exhibits proper breathing techniques, such as pursed lip breathing or other method taught during program session;Verbalizes importance of monitoring SPO2 with pulse oximeter and return demonstration    Comments Reviewed PLB technique with pt.  Talked about how it works and it's importance in maintaining their exercise saturations.    Goals/Expected Outcomes Short: Become more profiecient at using PLB.   Long: Become independent at using PLB.             Initial Exercise Prescription:  Initial Exercise Prescription - 01/26/23 1600       Date of Initial Exercise RX and Referring Provider   Date 01/26/23    Referring Provider Sarina Ser MD      Oxygen    Oxygen Continuous    Liters 4   Trach collar; 28% Fio2   Maintain Oxygen Saturation 88% or higher      Recumbant Bike   Level 1    RPM 50    Watts 15    Minutes 15    METs 1.9      T5 Nustep   Level 1    SPM 80    Minutes 15    METs 1.9      Biostep-RELP   Level 1    SPM 50    Minutes 15    METs 1.9      Track   Laps 4   Breaks as needed   Minutes 15    METs 1.1      Prescription Details   Frequency (times per week) 2    Duration Progress to 30 minutes of continuous aerobic without signs/symptoms of physical distress      Intensity   THRR 40-80% of Max Heartrate 123- 156    Ratings of Perceived Exertion 11-13    Perceived Dyspnea 0-4      Progression   Progression Continue to progress workloads to maintain intensity without signs/symptoms of physical distress.      Resistance Training   Training Prescription Yes    Weight 3 lb   no overhead exercises; relayed to patient   Reps 10-15             Perform Capillary Blood Glucose checks as needed.  Exercise Prescription Changes:   Exercise Prescription Changes     Row Name 01/26/23 1600             Response to Exercise   Blood Pressure (Admit) 118/74       Blood Pressure (Exercise) 132/80       Blood Pressure (Exit) 120/76       Heart Rate (Admit) 90 bpm       Heart Rate (Exercise) 114 bpm       Heart Rate (Exit) 87 bpm       Oxygen Saturation (Admit) 99 %       Oxygen Saturation (Exercise) 94 %       Oxygen Saturation (Exit) 98 %       Rating of Perceived Exertion (Exercise) 13       Perceived Dyspnea (Exercise) 3  Symptoms SOB, fatigue, back pain 7/10       Comments walk test results                Exercise Comments:   Exercise Comments     Row Name 03/13/23 1400           Exercise Comments First full day of exercise!  Patient was oriented to gym and equipment including functions, settings, policies, and procedures.  Patient's individual exercise prescription and  treatment plan were reviewed.  All starting workloads were established based on the results of the 6 minute walk test done at initial orientation visit.  The plan for exercise progression was also introduced and progression will be customized based on patient's performance and goals.                Exercise Goals and Review:   Exercise Goals     Row Name 01/26/23 1645             Exercise Goals   Increase Physical Activity Yes       Intervention Develop an individualized exercise prescription for aerobic and resistive training based on initial evaluation findings, risk stratification, comorbidities and participant's personal goals.;Provide advice, education, support and counseling about physical activity/exercise needs.       Expected Outcomes Short Term: Attend rehab on a regular basis to increase amount of physical activity.;Long Term: Add in home exercise to make exercise part of routine and to increase amount of physical activity.;Long Term: Exercising regularly at least 3-5 days a week.       Increase Strength and Stamina Yes       Intervention Provide advice, education, support and counseling about physical activity/exercise needs.       Expected Outcomes Short Term: Perform resistance training exercises routinely during rehab and add in resistance training at home;Long Term: Improve cardiorespiratory fitness, muscular endurance and strength as measured by increased METs and functional capacity ( );Short Term: Increase workloads from initial exercise prescription for resistance, speed, and METs.       Able to understand and use rate of perceived exertion (RPE) scale Yes       Intervention Provide education and explanation on how to use RPE scale       Expected Outcomes Short Term: Able to use RPE daily in rehab to express subjective intensity level;Long Term:  Able to use RPE to guide intensity level when exercising independently       Able to understand and use Dyspnea scale Yes        Intervention Provide education and explanation on how to use Dyspnea scale       Expected Outcomes Long Term: Able to use Dyspnea scale to guide intensity level when exercising independently;Short Term: Able to use Dyspnea scale daily in rehab to express subjective sense of shortness of breath during exertion       Knowledge and understanding of Target Heart Rate Range (THRR) Yes       Intervention Provide education and explanation of THRR including how the numbers were predicted and where they are located for reference       Expected Outcomes Short Term: Able to state/look up THRR;Long Term: Able to use THRR to govern intensity when exercising independently;Short Term: Able to use daily as guideline for intensity in rehab       Able to check pulse independently Yes       Intervention Provide education and demonstration on how to check pulse in carotid and  radial arteries.;Review the importance of being able to check your own pulse for safety during independent exercise       Expected Outcomes Long Term: Able to check pulse independently and accurately;Short Term: Able to explain why pulse checking is important during independent exercise       Understanding of Exercise Prescription Yes       Intervention Provide education, explanation, and written materials on patient's individual exercise prescription       Expected Outcomes Short Term: Able to explain program exercise prescription;Long Term: Able to explain home exercise prescription to exercise independently                Exercise Goals Re-Evaluation :  Exercise Goals Re-Evaluation     Row Name 03/13/23 1400             Exercise Goal Re-Evaluation   Exercise Goals Review Increase Physical Activity;Able to understand and use rate of perceived exertion (RPE) scale;Knowledge and understanding of Target Heart Rate Range (THRR);Understanding of Exercise Prescription;Increase Strength and Stamina;Able to check pulse  independently;Able to understand and use Dyspnea scale       Comments Reviewed RPE scale, THR and program prescription with pt today.  Pt voiced understanding and was given a copy of goals to take home.       Expected Outcomes Short: Use RPE daily to regulate intensity.  Long: Follow program prescription in THR.                Discharge Exercise Prescription (Final Exercise Prescription Changes):  Exercise Prescription Changes - 01/26/23 1600       Response to Exercise   Blood Pressure (Admit) 118/74    Blood Pressure (Exercise) 132/80    Blood Pressure (Exit) 120/76    Heart Rate (Admit) 90 bpm    Heart Rate (Exercise) 114 bpm    Heart Rate (Exit) 87 bpm    Oxygen Saturation (Admit) 99 %    Oxygen Saturation (Exercise) 94 %    Oxygen Saturation (Exit) 98 %    Rating of Perceived Exertion (Exercise) 13    Perceived Dyspnea (Exercise) 3    Symptoms SOB, fatigue, back pain 7/10    Comments walk test results             Nutrition:  Target Goals: Understanding of nutrition guidelines, daily intake of sodium 1500mg , cholesterol 200mg , calories 30% from fat and 7% or less from saturated fats, daily to have 5 or more servings of fruits and vegetables.  Education: All About Nutrition: -Group instruction provided by verbal, written material, interactive activities, discussions, models, and posters to present general guidelines for heart healthy nutrition including fat, fiber, MyPlate, the role of sodium in heart healthy nutrition, utilization of the nutrition label, and utilization of this knowledge for meal planning. Follow up email sent as well. Written material given at graduation.   Biometrics:  Pre Biometrics - 01/26/23 1624       Pre Biometrics   Height 5\' 9"  (1.753 m)    Weight 275 lb 4.8 oz (124.9 kg)    Waist Circumference 51 inches    Hip Circumference 52.5 inches    Waist to Hip Ratio 0.97 %    BMI (Calculated) 40.64    Single Leg Stand 14.2 seconds               Nutrition Therapy Plan and Nutrition Goals:  Nutrition Therapy & Goals - 01/26/23 1603       Nutrition Therapy  RD appointment deferred Yes   Deferred 01/26/23            Nutrition Assessments:  MEDIFICTS Score Key: ?70 Need to make dietary changes  40-70 Heart Healthy Diet ? 40 Therapeutic Level Cholesterol Diet  Flowsheet Row Pulmonary Rehab from 01/26/2023 in [facility] Cardiac and Pulmonary Rehab  Picture Your Plate Total Score on Admission 47      Picture Your Plate Scores: <29 Unhealthy dietary pattern with much room for improvement. 41-50 Dietary pattern unlikely to meet recommendations for good health and room for improvement. 51-60 More healthful dietary pattern, with some room for improvement.  >60 Healthy dietary pattern, although there may be some specific behaviors that could be improved.   Nutrition Goals Re-Evaluation:   Nutrition Goals Discharge (Final Nutrition Goals Re-Evaluation):   Psychosocial: Target Goals: Acknowledge presence or absence of significant depression and/or stress, maximize coping skills, provide positive support system. Participant is able to verbalize types and ability to use techniques and skills needed for reducing stress and depression.   Education: Stress, Anxiety, and Depression - Group verbal and visual presentation to define topics covered.  Reviews how body is impacted by stress, anxiety, and depression.  Also discusses healthy ways to reduce stress and to treat/manage anxiety and depression.  Written material given at graduation.   Education: Sleep Hygiene -Provides group verbal and written instruction about how sleep can affect your health.  Define sleep hygiene, discuss sleep cycles and impact of sleep habits. Review good sleep hygiene tips.    Initial Review & Psychosocial Screening:  Initial Psych Review & Screening - 01/16/23 1217       Initial Review   Current issues with Current Stress Concerns    Source  of Stress Concerns Chronic Illness    Comments Patient states that she has her kids and husband for support. Other parts of her family can be caled for support but not often. She wants to get her health back on track and breath better.      Family Dynamics   Good Support System? Yes    Comments Short: Start [employer] to help with mood. Long: Maintain a healthy mental state.      Screening Interventions   Interventions Encouraged to exercise;To provide support and resources with identified psychosocial needs;Provide feedback about the scores to participant    Expected Outcomes Short Term goal: Utilizing psychosocial counselor, staff and physician to assist with identification of specific Stressors or current issues interfering with healing process. Setting desired goal for each stressor or current issue identified.;Long Term Goal: Stressors or current issues are controlled or eliminated.;Short Term goal: Identification and review with participant of any Quality of Life or Depression concerns found by scoring the questionnaire.;Long Term goal: The participant improves quality of Life and PHQ9 Scores as seen by post scores and/or verbalization of changes             Quality of Life Scores:  Scores of 19 and below usually indicate a poorer quality of life in these areas.  A difference of  2-3 points is a clinically meaningful difference.  A difference of 2-3 points in the total score of the Quality of Life Index has been associated with significant improvement in overall quality of life, self-image, physical symptoms, and general health in studies assessing change in quality of life.  PHQ-9: Review Flowsheet       01/26/2023  Depression screen PHQ 2/9  Decreased Interest 3  Down, Depressed, Hopeless 1  PHQ -  2 Score 4  Altered sleeping 0  Tired, decreased energy 3  Change in appetite 0  Feeling bad or failure about yourself  0  Trouble concentrating 0  Moving slowly or fidgety/restless  0  Suicidal thoughts 0  PHQ-9 Score 7  Difficult doing work/chores Somewhat difficult   Interpretation of Total Score  Total Score Depression Severity:  1-4 = Minimal depression, 5-9 = Mild depression, 10-14 = Moderate depression, 15-19 = Moderately severe depression, 20-27 = Severe depression   Psychosocial Evaluation and Intervention:  Psychosocial Evaluation - 01/16/23 1220       Psychosocial Evaluation & Interventions   Interventions Stress management education;Relaxation education;Encouraged to exercise with the program and follow exercise prescription    Comments Patient states that she has her kids and husband for support. Other parts of her family can be caled for support but not often. She wants to get her health back on track and breath better.    Expected Outcomes Short: Start [facility] to help with mood. Long: Maintain a healthy mental state.    Continue Psychosocial Services  Follow up required by staff             Psychosocial Re-Evaluation:   Psychosocial Discharge (Final Psychosocial Re-Evaluation):   Education: Education Goals: Education classes will be provided on a weekly basis, covering required topics. Participant will state understanding/return demonstration of topics presented.  Learning Barriers/Preferences:  Learning Barriers/Preferences - 01/16/23 1215       Learning Barriers/Preferences   Learning Barriers None    Learning Preferences None             General Pulmonary Education Topics:  Infection Prevention: - Provides verbal and written material to individual with discussion of infection control including proper hand washing and proper equipment cleaning during exercise session. Flowsheet Row Pulmonary Rehab from 01/16/2023 in [facility] Cardiac and Pulmonary Rehab  Date 01/16/23  Educator [facility]  Instruction Review Code 1- Verbalizes Understanding       Falls Prevention: - Provides verbal and written material to individual with  discussion of falls prevention and safety. Flowsheet Row Pulmonary Rehab from 01/16/2023 in [facility] Cardiac and Pulmonary Rehab  Date 01/16/23  Educator [facility]  Instruction Review Code 1- Verbalizes Understanding       Chronic Lung Disease Review: - Group verbal instruction with posters, models, PowerPoint presentations and videos,  to review new updates, new respiratory medications, new advancements in procedures and treatments. Providing information on websites and "800" numbers for continued self-education. Includes information about supplement oxygen, available portable oxygen systems, continuous and intermittent flow rates, oxygen safety, concentrators, and Medicare reimbursement for oxygen. Explanation of Pulmonary Drugs, including class, frequency, complications, importance of spacers, rinsing mouth after steroid MDI's, and proper cleaning methods for nebulizers. Review of basic lung anatomy and physiology related to function, structure, and complications of lung disease. Review of risk factors. Discussion about methods for diagnosing sleep apnea and types of masks and machines for OSA. Includes a review of the use of types of environmental controls: home humidity, furnaces, filters, dust mite/pet prevention, HEPA vacuums. Discussion about weather changes, air quality and the benefits of nasal washing. Instruction on Warning signs, infection symptoms, calling MD promptly, preventive modes, and value of vaccinations. Review of effective airway clearance, coughing and/or vibration techniques. Emphasizing that all should Create an Action Plan. Written material given at graduation. Flowsheet Row Pulmonary Rehab from 01/26/2023 in [facility] Cardiac and Pulmonary Rehab  Education need identified 01/26/23       AED/CPR: -  Group verbal and written instruction with the use of models to demonstrate the basic use of the AED with the basic ABC's of resuscitation.    Anatomy and Cardiac Procedures: - Group verbal  and visual presentation and models provide information about basic cardiac anatomy and function. Reviews the testing methods done to diagnose heart disease and the outcomes of the test results. Describes the treatment choices: Medical Management, Angioplasty, or Coronary Bypass Surgery for treating various heart conditions including Myocardial Infarction, Angina, Valve Disease, and Cardiac Arrhythmias.  Written material given at graduation.   Medication Safety: - Group verbal and visual instruction to review commonly prescribed medications for heart and lung disease. Reviews the medication, class of the drug, and side effects. Includes the steps to properly store meds and maintain the prescription regimen.  Written material given at graduation.   Other: -Provides group and verbal instruction on various topics (see comments)   Knowledge Questionnaire Score:  Knowledge Questionnaire Score - 01/26/23 1557       Knowledge Questionnaire Score   Pre Score 15/18              Core Components/Risk Factors/Patient Goals at Admission:  Personal Goals and Risk Factors at Admission - 01/26/23 1646       Core Components/Risk Factors/Patient Goals on Admission    Weight Management Yes;Weight Loss;Obesity    Intervention Weight Management: Develop a combined nutrition and exercise program designed to reach desired caloric intake, while maintaining appropriate intake of nutrient and fiber, sodium and fats, and appropriate energy expenditure required for the weight goal.;Weight Management: Provide education and appropriate resources to help participant work on and attain dietary goals.;Weight Management/Obesity: Establish reasonable short term and long term weight goals.;Obesity: Provide education and appropriate resources to help participant work on and attain dietary goals.    Admit Weight 275 lb (124.7 kg)    Goal Weight: Short Term 270 lb (122.5 kg)    Goal Weight: Long Term 225 lb (102.1 kg)     Expected Outcomes Short Term: Continue to assess and modify interventions until short term weight is achieved;Long Term: Adherence to nutrition and physical activity/exercise program aimed toward attainment of established weight goal;Weight Loss: Understanding of general recommendations for a balanced deficit meal plan, which promotes 1-2 lb weight loss per week and includes a negative energy balance of [phone] kcal/d;Understanding recommendations for meals to include 15-35% energy as protein, 25-35% energy from fat, 35-60% energy from carbohydrates, less than  of dietary cholesterol, 20-35 gm of total fiber daily;Understanding of distribution of calorie intake throughout the day with the consumption of 4-5 meals/snacks    Improve shortness of breath with ADL's Yes    Intervention Provide education, individualized exercise plan and daily activity instruction to help decrease symptoms of SOB with activities of daily living.    Expected Outcomes Short Term: Improve cardiorespiratory fitness to achieve a reduction of symptoms when performing ADLs;Long Term: Be able to perform more ADLs without symptoms or delay the onset of symptoms    Increase knowledge of respiratory medications and ability to use respiratory devices properly  Yes    Intervention Provide education and demonstration as needed of appropriate use of medications, inhalers, and oxygen therapy.    Expected Outcomes Short Term: Achieves understanding of medications use. Understands that oxygen is a medication prescribed by physician. Demonstrates appropriate use of inhaler and oxygen therapy.;Long Term: Maintain appropriate use of medications, inhalers, and oxygen therapy.  Education:Diabetes - Individual verbal and written instruction to review signs/symptoms of diabetes, desired ranges of glucose level fasting, after meals and with exercise. Acknowledge that pre and post exercise glucose checks will be done for 3 sessions  at entry of program.   Know Your Numbers and Heart Failure: - Group verbal and visual instruction to discuss disease risk factors for cardiac and pulmonary disease and treatment options.  Reviews associated critical values for Overweight/Obesity, Hypertension, Cholesterol, and Diabetes.  Discusses basics of heart failure: signs/symptoms and treatments.  Introduces Heart Failure Zone chart for action plan for heart failure.  Written material given at graduation.   Core Components/Risk Factors/Patient Goals Review:    Core Components/Risk Factors/Patient Goals at Discharge (Final Review):    ITP Comments:  ITP Comments     Row Name 01/16/23 1212 01/26/23 1553 02/15/23 0932 03/13/23 1359 03/15/23 1401   ITP Comments Virtual Visit completed. Patient informed on EP and RD appointment and 6 Minute walk test. Patient also informed of patient health questionnaires on My Chart. Patient Verbalizes understanding. Visit diagnosis can be found in [facility] 12/27/2022. Completed and gym orientation. Initial ITP created and sent for review to Dr. Vida Rigger,  Medical Director. 30 Day review completed. Medical Director ITP review done, changes made as directed, and signed approval by Medical Director.    new to program First full day of exercise!  Patient was oriented to gym and equipment including functions, settings, policies, and procedures.  Patient's individual exercise prescription and treatment plan were reviewed.  All starting workloads were established based on the results of the 6 minute walk test done at initial orientation visit.  The plan for exercise progression was also introduced and progression will be customized based on patient's performance and goals. 30 day review completed. ITP sent to Dr. Jinny Sanders, Medical Director of  Pulmonary Rehab. Continue with ITP unless changes are made by physician. First day of exercise was just this week, pt delayed her start.            Comments: 30  day review

## 2023-03-20 ENCOUNTER — Encounter: Payer: 59 | Admitting: *Deleted

## 2023-03-20 DIAGNOSIS — J4540 Moderate persistent asthma, uncomplicated: Secondary | ICD-10-CM

## 2023-03-20 NOTE — Progress Notes (Signed)
Daily Session Note  Patient Details  Name: PRESLY STEINRUCK MRN: [id number] Date of Birth: [birth year] Referring Provider:   Flowsheet Row Pulmonary Rehab from 01/26/2023 in [facility] Cardiac and Pulmonary Rehab  Referring Provider Sarina Ser MD       Encounter Date: 03/20/2023  Check In:  Session Check In - 03/20/23 1401       Check-In   Supervising physician immediately available to respond to emergencies See telemetry face sheet for immediately available ER MD    Location [facility]-Cardiac & Pulmonary Rehab    Staff Present Susann Givens, RN BSN;Joseph Reino Kent, Guinevere Ferrari, RN, [place]    Virtual Visit No    Medication changes reported     No    Fall or balance concerns reported    No    Warm-up and Cool-down Performed on first and last piece of equipment    Resistance Training Performed Yes    VAD Patient? No    PAD/SET Patient? No      Pain Assessment   Currently in Pain? No/denies                Social History   Tobacco Use  Smoking Status Former   Packs/day: 0.50   Years: 5.00   Additional pack years: 0.00   Total pack years: 2.50   Types: Cigarettes   Quit date: 2000   Years since quitting: 24.3  Smokeless Tobacco Never    Goals Met:  Independence with exercise equipment Exercise tolerated well No report of concerns or symptoms today Strength training completed today  Goals Unmet:  Not Applicable  Comments: Pt able to follow exercise prescription today without complaint.  Will continue to monitor for progression.    Dr. Bethann Punches is Medical Director for [facility] Cardiac Rehabilitation.  Dr. Vida Rigger is Medical Director for [facility] Pulmonary Rehabilitation.

## 2023-03-22 ENCOUNTER — Ambulatory Visit (INDEPENDENT_AMBULATORY_CARE_PROVIDER_SITE_OTHER): Payer: Medicaid Other | Admitting: Pulmonary Disease

## 2023-03-22 ENCOUNTER — Encounter: Payer: 59 | Admitting: *Deleted

## 2023-03-22 ENCOUNTER — Encounter: Payer: Self-pay | Admitting: Pulmonary Disease

## 2023-03-22 VITALS — BP 124/84 | HR 100 | Temp 97.9°F | Ht 69.0 in | Wt 280.8 lb

## 2023-03-22 DIAGNOSIS — J8410 Pulmonary fibrosis, unspecified: Secondary | ICD-10-CM | POA: Diagnosis not present

## 2023-03-22 DIAGNOSIS — J386 Stenosis of larynx: Secondary | ICD-10-CM | POA: Diagnosis not present

## 2023-03-22 DIAGNOSIS — J4540 Moderate persistent asthma, uncomplicated: Secondary | ICD-10-CM | POA: Diagnosis not present

## 2023-03-22 DIAGNOSIS — E039 Hypothyroidism, unspecified: Secondary | ICD-10-CM

## 2023-03-22 DIAGNOSIS — J9611 Chronic respiratory failure with hypoxia: Secondary | ICD-10-CM | POA: Diagnosis not present

## 2023-03-22 NOTE — Progress Notes (Signed)
Subjective:    Patient ID: Kathy Vega, female    DOB: 02/26/1976, 47 y.o.   MRN: [id number] Patient Care Team: Center, [facility] as PCP - General (General Practice) Salena Saner, MD as Consulting Physician (Pulmonary Disease) Earna Coder, MD as Consulting Physician (Internal Medicine)  Chief Complaint  Patient presents with   Follow-up    SOB. No wheezing. Dry cough. Occasional white sputum.    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  Patient was last seen here on 27 December 2022 by me.  This is a scheduled visit.  As noted she has tracheal stenosis at has been followed by Dr. Vergie Living at [facility].  She currently has a #4 Shiley.  Previously, she could not tolerate Passy-Muir valve however she is tolerating this better.  After an admission in November 2023 she ended up with O2 at 6 L/min.  However, I did not see a true indication for this.  She was reassessed during her last visit and was noted to have adequate oxygenation with ambulation on 2 L/min via trach.  She was instructed to go down to 2 L/min nocturnally and with ambulation.  She can be off of oxygen during quiet activity.  However today she presents stating that she is still using 5 L/min with sleep and with ambulation.  We discussed that she had been tested and does not need that higher flow.  She is undergoing pulmonary rehab and has not had significant desaturations during the sessions.  She feels that she is getting benefit from pulmonary rehab.  She has not any chest pain.  No cough or sputum production.  Her trach site looks very clean she does self care.  She is vocalizing well with the #4 Shiley and Passy-Muir valve.  She last saw Dr. Vergie Living at [facility] on 28 March, to  correct her issues and be able to decannulate her she will need open surgical tracheal reconstruction which she wants to postpone for now.  She continues to have trach changes through Dr. Atha Starks office.  At her prior visit we added Perforomist to her medication regimen for her asthma and asked her to continue Pulmicort.  She has been doing this twice a day and notes benefit from the therapy.  She rarely has to use albuterol rescue.  No other concerns voiced today.  Review of Systems A 10 point review of systems was performed and it is as noted above otherwise negative.  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) 09/18/2020   Acute respiratory failure    Pneumonia due to COVID-19 virus 08/13/2020   Acute renal failure (ARF) 08/13/2020   Elevated liver enzymes 08/13/2020   CAP (community acquired pneumonia) 08/13/2020   Acute encephalopathy 08/13/2020   Hypokalemia 08/13/2020   Social History   Tobacco Use   Smoking status: Former    Packs/day: 0.50  Years: 5.00    Additional pack years: 0.00    Total pack years: 2.50    Types: Cigarettes    Quit date: 2000    Years since quitting: 24.3   Smokeless tobacco: Never  Substance Use Topics   Alcohol use: No   No Known Allergies  Current Meds  Medication Sig   acetaminophen (TYLENOL) 500 MG tablet Take 500 mg by mouth every 6 (six) hours as needed for headache, fever, moderate pain or mild pain.   albuterol (PROVENTIL) (2.5 MG/3ML) 0.083% nebulizer solution Take 3 mLs (2.5 mg total) by nebulization every 6 (six)  hours as needed for wheezing or shortness of breath.   ascorbic acid (VITAMIN C) 500 MG tablet Take 1 tablet (500 mg total) by mouth daily.   budesonide (PULMICORT) 0.5 MG/2ML nebulizer solution Take 2 mLs (0.5 mg total) by nebulization 2 (two) times daily.   formoterol (PERFOROMIST) 20 MCG/2ML nebulizer solution Take 2 mLs (20 mcg total) by nebulization 2 (two) times daily. Use before the budesonide, twice a day.   furosemide (LASIX) 20 MG tablet Take 1 tablet (20 mg total) by mouth daily as needed for edema (shortness of breath).   hydrOXYzine (ATARAX) 10 MG tablet Take 10 mg by mouth 3 (three) times daily.   iron polysaccharides (NIFEREX) 150 MG capsule Take 1 capsule (150 mg total) by mouth daily.   levothyroxine (SYNTHROID) 125 MCG tablet Take 250 mcg by mouth daily.   norethindrone (AYGESTIN) 5 MG tablet Take 10 mg by mouth daily.   nystatin cream (MYCOSTATIN) Apply topically 2 (two) times daily.   OXYGEN Inhale into the lungs at bedtime as needed. Up to 4 liters   pantoprazole (PROTONIX) 40 MG tablet Take 1 tablet (40 mg total) by mouth daily.   propranolol (INDERAL) 10 MG tablet Take 1 tablet (10 mg total) by mouth 3 (three) times daily as needed (palpitations).    There is no immunization history on file for this patient.     Objective:   Physical Exam BP 124/84 (BP Location: Left Arm, Cuff Size: Large)   Pulse 100   Temp 97.9 F (36.6 C)   Ht  (1.753 m)   Wt 280 lb 12.8 oz (127.4 kg)   SpO2 100%   BMI 41.47 kg/m    SpO2: 100 % O2 Device: None (Room air)  GENERAL: Obese woman, no acute distress, tracheostomy in place.  He has a Passy-Muir valve and able to speak without difficulty.  Not wearing oxygen today. HEAD: Normocephalic, atraumatic.  EYES: Pupils equal, round, reactive to light.  No scleral icterus.  MOUTH: Oral mucosa moist.  No thrush. NECK: Supple. No lymphadenopathy.  Tracheostomy in place as above with Passy-Muir valve. No JVD.  Trachea  midline. PULMONARY: Good air entry bilaterally.  Scattered end expiratory wheezes noted.   CARDIOVASCULAR: S1 and S2. Regular rate and rhythm.  No rubs, murmurs or gallops heard. ABDOMEN: Obese otherwise unremarkable. MUSCULOSKELETAL: No joint deformity, no clubbing, no edema.  NEUROLOGIC: No overt focal deficit. SKIN: Intact,warm,dry. PSYCH: Mood and behavior normal.      Assessment & Plan:     ICD-10-CM   1. 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 Obesity, morbid, BMI 40.0-49.9  E66.01    This issue adds complexity to her management Weight loss recommended    6. Hypothyroidism, unspecified type  E03.9    She is on Synthroid Managed by primary care Has been referred to [facility] endocrinology     Orders Placed This Encounter  Procedures   Pulse oximetry, overnight    On room air DME: [employer]    Standing Status:   Future    Standing Expiration Date:   03/21/2024   Will see the patient in follow-up in 3 months time she is to contact us prior to that time should any new difficulties arise.   Gailen Shelter, MD Advanced Bronchoscopy [facility]    *This note was dictated using voice recognition software/Dragon.  Despite best efforts to proofread, errors can occur which can change the meaning.  Any transcriptional errors that result from this process are unintentional and may not be fully corrected at the time of dictation.

## 2023-03-22 NOTE — Patient Instructions (Signed)
Your lungs sounded very clear today.  Continue going to pulmonary rehab I think it is doing you lot of good.  Continue follow-up with Dr. Vergie Living.  We are going to check your oxygen at nighttime while you sleep.  I will see you in follow-up in 3 months time call sooner should any problems arise.

## 2023-03-22 NOTE — Progress Notes (Signed)
Daily Session Note  Patient Details  Name: Kathy Vega MRN: [id number] Date of Birth: 03/15/1976 Referring Provider:   Flowsheet Row Pulmonary Rehab from 01/26/2023 in [facility] Cardiac and Pulmonary Rehab  Referring Provider Sarina Ser MD       Encounter Date: 03/22/2023  Check In:  Session Check In - 03/22/23 1107       Check-In   Supervising physician immediately available to respond to emergencies See telemetry face sheet for immediately available ER MD    Location [facility]-Cardiac & Pulmonary Rehab    Staff Present Lanny Hurst, RN, ADN;Joseph Reino Kent, RCP,RRT,BSRT;Noah Tickle, BS, Exercise Physiologist    Virtual Visit No    Medication changes reported     No    Fall or balance concerns reported    No    Warm-up and Cool-down Performed on first and last piece of equipment    Resistance Training Performed Yes    VAD Patient? No    PAD/SET Patient? No      Pain Assessment   Currently in Pain? No/denies                Social History   Tobacco Use  Smoking Status Former   Packs/day: 0.50   Years: 5.00   Additional pack years: 0.00   Total pack years: 2.50   Types: Cigarettes   Quit date: 2000   Years since quitting: 24.3  Smokeless Tobacco Never    Goals Met:  Independence with exercise equipment Exercise tolerated well No report of concerns or symptoms today Strength training completed today  Goals Unmet:  Not Applicable  Comments: Pt able to follow exercise prescription today without complaint.  Will continue to monitor for progression.    Dr. Bethann Punches is Medical Director for [facility] Cardiac Rehabilitation.  Dr. Vida Rigger is Medical Director for [facility] Pulmonary Rehabilitation.

## 2023-03-24 ENCOUNTER — Encounter: Payer: Self-pay | Admitting: Internal Medicine

## 2023-03-30 ENCOUNTER — Other Ambulatory Visit: Payer: Self-pay

## 2023-03-30 ENCOUNTER — Encounter: Payer: Self-pay | Admitting: Emergency Medicine

## 2023-03-30 ENCOUNTER — Emergency Department: Payer: 59

## 2023-03-30 ENCOUNTER — Emergency Department
Admission: EM | Admit: 2023-03-30 | Discharge: 2023-03-30 | Disposition: A | Discharge status: Home / Self Care | Payer: 59 | Attending: Emergency Medicine | Admitting: Emergency Medicine

## 2023-03-30 DIAGNOSIS — X509XXA Other and unspecified overexertion or strenuous movements or postures, initial encounter: Secondary | ICD-10-CM | POA: Insufficient documentation

## 2023-03-30 DIAGNOSIS — S39012A Strain of muscle, fascia and tendon of lower back, initial encounter: Secondary | ICD-10-CM

## 2023-03-30 DIAGNOSIS — M545 Low back pain, unspecified: Secondary | ICD-10-CM | POA: Diagnosis present

## 2023-03-30 LAB — POC URINE PREG, ED: Preg Test, Ur: NEGATIVE

## 2023-03-30 MED ORDER — METHOCARBAMOL 500 MG PO TABS
1000.0000 mg | ORAL_TABLET | Freq: Once | ORAL | Status: AC
  Administered 2023-03-30: 1000 mg via ORAL
  Filled 2023-03-30: qty 2

## 2023-03-30 MED ORDER — MELOXICAM 15 MG PO TABS: 15.0000 mg | ORAL_TABLET | Freq: Every day | ORAL | 0 refills | Status: DC

## 2023-03-30 MED ORDER — HYDROCODONE-ACETAMINOPHEN 5-325 MG PO TABS: 1.0000 | ORAL_TABLET | ORAL | 0 refills | Status: DC | PRN

## 2023-03-30 MED ORDER — METHOCARBAMOL 500 MG PO TABS: 500.0000 mg | ORAL_TABLET | Freq: Four times a day (QID) | ORAL | 0 refills | Status: DC

## 2023-03-30 MED ORDER — HYDROCODONE-ACETAMINOPHEN 5-325 MG PO TABS
1.0000 | ORAL_TABLET | Freq: Once | ORAL | Status: AC
  Administered 2023-03-30: 1 via ORAL
  Filled 2023-03-30: qty 1

## 2023-03-30 MED ORDER — KETOROLAC TROMETHAMINE 30 MG/ML IJ SOLN
30.0000 mg | Freq: Once | INTRAMUSCULAR | Status: AC
  Administered 2023-03-30: 30 mg via INTRAMUSCULAR
  Filled 2023-03-30: qty 1

## 2023-03-30 NOTE — ED Triage Notes (Signed)
Patient to ED for lower back pain x1 week. No new injury.

## 2023-03-30 NOTE — ED Notes (Signed)
Patient Alert and oriented to baseline. Stable and ambulatory to baseline. Patient verbalized understanding of the discharge instructions.  Patient belongings were taken by the patient.   

## 2023-03-30 NOTE — ED Provider Notes (Signed)
[facility] Provider Note  Patient Contact: 3:07 PM (approximate)   History   Back Pain   HPI  Kathy Vega is a 47 y.o. female who presents emergency department complaining of low back pain.  Patient states roughly 4 to 5 days ago she was bending over to pick up a watermelon, felt a pull in her back has back pain since.  Patient has no radicular symptoms, bowel or bladder dysfunction, saddle anesthesia or paresthesias.  No direct trauma to the spine.  No history of chronic back pain.  No urinary or GI complaints.     Physical Exam   Triage Vital Signs: ED Triage Vitals  Enc Vitals Group     BP 03/30/23 1428 (!) 149/75     Pulse Rate 03/30/23 1428 (!) 104     Resp 03/30/23 1428 18     Temp 03/30/23 1428 98.2 F (36.8 C)     Temp Source 03/30/23 1428 Oral     SpO2 03/30/23 1428 97 %     Weight --      Height --      Head Circumference --      Peak Flow --      Pain Score 03/30/23 1427 10     Pain Loc --      Pain Edu? --      Excl. in GC? --     Most recent vital signs: Vitals:   03/30/23 1428  BP: (!) 149/75  Pulse: (!) 104  Resp: 18  Temp: 98.2 F (36.8 C)  SpO2: 97%     General: Alert and in no acute distress.   Cardiovascular:  Good peripheral perfusion Respiratory: Normal respiratory effort without tachypnea or retractions. Lungs CTAB.  Gastrointestinal: Bowel sounds 4 quadrants. Soft and nontender to palpation. No guarding or rigidity. No palpable masses. No distention. No CVA tenderness. Musculoskeletal: Full range of motion to all extremities.  Visualization of the lumbar spine reveals no visible signs of trauma.  Patient has diffuse tenderness midline and bilateral paraspinal region.  No palpable abnormality or step-off.  No extension into the SI joints or sciatic notches.  Pulses sensation intact and equal bilateral lower extremities. Neurologic:  No gross focal neurologic deficits are appreciated.  Skin:   No rash  noted Other:   ED Results / Procedures / Treatments   Labs (all labs ordered are listed, but only abnormal results are displayed) Labs Reviewed  POC URINE PREG, ED     EKG     RADIOLOGY  I personally viewed, evaluated, and interpreted these images as part of my medical decision making, as well as reviewing the written report by the radiologist.  ED Provider Interpretation: CT scan without evidence of compression fracture or herniated disc.  CT Lumbar Spine Wo Contrast  Result Date: 03/30/2023 CLINICAL DATA:  Low back pain, cauda equina syndrome suspected Sudden sharp lower back pain after lifting item a week ago EXAM: CT LUMBAR SPINE WITHOUT CONTRAST TECHNIQUE: Multidetector CT imaging of the lumbar spine was performed without intravenous contrast administration. Multiplanar CT image reconstructions were also generated. RADIATION DOSE REDUCTION: This exam was performed according to the departmental dose-optimization program which includes automated exposure control, adjustment of the mA and/or kV according to patient size and/or use of iterative reconstruction technique. COMPARISON:  None Available. FINDINGS: Segmentation: 5 lumbar type vertebrae. Alignment: Normal. Vertebrae: No acute fracture or focal pathologic process. Paraspinal and other soft tissues: Negative. Disc levels: Maintained. No disc herniation.  No central canal stenosis or neural foraminal narrowing. IMPRESSION: Unremarkable study. Electronically Signed   By: Charlett Nose M.D.   On: 03/30/2023 15:51    PROCEDURES:  Critical Care performed: No  Procedures   MEDICATIONS ORDERED IN ED: Medications  ketorolac (TORADOL) 30 MG/ML injection 30 mg (has no administration in time range)  methocarbamol (ROBAXIN) tablet 1,000 mg (has no administration in time range)  HYDROcodone-acetaminophen (NORCO/VICODIN) 5-325 MG per tablet 1 tablet (has no administration in time range)     IMPRESSION / MDM / ASSESSMENT AND PLAN /  ED COURSE  I reviewed the triage vital signs and the nursing notes.                                 Differential diagnosis includes, but is not limited to, muscle strain, herniated disc, compression fracture of the lumbar spine  Patient's presentation is most consistent with acute presentation with potential threat to life or bodily function.   Patient's diagnosis is consistent with lumbar strain.  Patient presents emergency department after bending over to pick up a watermelon.  Patient states that she has had pain in her lower back with no radiation down her legs, no urinary changes, bowel changes, saddle anesthesia.  Exam was reassuring and patient was neurovascularly intact in the lower extremities.  Imaging is reassuring without evidence of compression fracture or herniated disc.  At this time patient will be given symptom control medications at home..  Toradol and Robaxin given here in the ED.  Follow-up primary care as needed.  Patient is given ED precautions to return to the ED for any worsening or new symptoms.     FINAL CLINICAL IMPRESSION(S) / ED DIAGNOSES   Final diagnoses:  Strain of lumbar region, initial encounter     Rx / DC Orders   ED Discharge Orders          Ordered    HYDROcodone-acetaminophen (NORCO/VICODIN) 5-325 MG tablet  Every 4 hours PRN        03/30/23 1702    methocarbamol (ROBAXIN) 500 MG tablet  4 times daily        03/30/23 1702    meloxicam (MOBIC) 15 MG tablet  Daily        03/30/23 1702             Note:  This document was prepared using Dragon voice recognition software and may include unintentional dictation errors.   Lanette Hampshire 03/30/23 1702    Jene Every, MD 03/30/23 Zollie Pee

## 2023-03-31 ENCOUNTER — Inpatient Hospital Stay: Payer: 59

## 2023-03-31 ENCOUNTER — Inpatient Hospital Stay: Payer: 59 | Admitting: Internal Medicine

## 2023-04-03 ENCOUNTER — Encounter: Payer: 59 | Attending: Pulmonary Disease

## 2023-04-03 DIAGNOSIS — J4540 Moderate persistent asthma, uncomplicated: Secondary | ICD-10-CM | POA: Insufficient documentation

## 2023-04-04 ENCOUNTER — Encounter: Payer: Self-pay | Admitting: *Deleted

## 2023-04-04 ENCOUNTER — Telehealth: Payer: Self-pay | Admitting: *Deleted

## 2023-04-04 DIAGNOSIS — J4540 Moderate persistent asthma, uncomplicated: Secondary | ICD-10-CM

## 2023-04-04 NOTE — Telephone Encounter (Signed)
Kathy Vega called to let us know that she was still have significant back pain.  She went to ED for it and was given a shot and pain killers.  She has been resting and icing.  She wants to give it until Monday 5/13 to try again.

## 2023-04-10 ENCOUNTER — Encounter: Payer: 59 | Admitting: *Deleted

## 2023-04-10 DIAGNOSIS — J4540 Moderate persistent asthma, uncomplicated: Secondary | ICD-10-CM | POA: Diagnosis present

## 2023-04-10 NOTE — Progress Notes (Signed)
Daily Session Note  Patient Details  Name: Kathy Vega MRN: [id number] Date of Birth: May 02, 1976 Referring Provider:   Flowsheet Row Pulmonary Rehab from 01/26/2023 in [facility]  Referring Provider Sarina Ser MD       Encounter Date: 04/10/2023  Check In:  Session Check In - 04/10/23 1358       Check-In   Supervising physician immediately available to respond to emergencies See telemetry face sheet for immediately available ER MD    Location [facility]-Cardiac & Pulmonary Rehab    Staff Present Susann Givens, RN BSN;Joseph Reino Kent, Guinevere Ferrari, RN, [place]    Virtual Visit No    Medication changes reported     No    Fall or balance concerns reported    No    Warm-up and Cool-down Performed on first and last piece of equipment    Resistance Training Performed Yes    VAD Patient? No    PAD/SET Patient? No      Pain Assessment   Currently in Pain? No/denies                Social History   Tobacco Use  Smoking Status Former   Packs/day: 0.50   Years: 5.00   Additional pack years: 0.00   Total pack years: 2.50   Types: Cigarettes   Quit date: 2000   Years since quitting: 24.3  Smokeless Tobacco Never    Goals Met:  Independence with exercise equipment Exercise tolerated well No report of concerns or symptoms today Strength training completed today  Goals Unmet:  Not Applicable  Comments: Pt able to follow exercise prescription today without complaint.  Will continue to monitor for progression.   Dr. Bethann Punches is Medical Director for [facility] Cardiac Rehabilitation.  Dr. Vida Rigger is Medical Director for [facility] Pulmonary Rehabilitation.

## 2023-04-11 ENCOUNTER — Inpatient Hospital Stay: Payer: 59

## 2023-04-11 ENCOUNTER — Inpatient Hospital Stay: Payer: 59 | Attending: Internal Medicine

## 2023-04-11 ENCOUNTER — Inpatient Hospital Stay (HOSPITAL_BASED_OUTPATIENT_CLINIC_OR_DEPARTMENT_OTHER): Payer: 59 | Admitting: Internal Medicine

## 2023-04-11 ENCOUNTER — Encounter: Payer: Self-pay | Admitting: Internal Medicine

## 2023-04-11 VITALS — BP 139/74 | HR 93 | Temp 96.8°F | Ht 69.0 in | Wt 276.4 lb

## 2023-04-11 DIAGNOSIS — D509 Iron deficiency anemia, unspecified: Secondary | ICD-10-CM | POA: Insufficient documentation

## 2023-04-11 DIAGNOSIS — E611 Iron deficiency: Secondary | ICD-10-CM

## 2023-04-11 DIAGNOSIS — N920 Excessive and frequent menstruation with regular cycle: Secondary | ICD-10-CM | POA: Diagnosis not present

## 2023-04-11 LAB — CBC WITH DIFFERENTIAL (CANCER CENTER ONLY)
Abs Immature Granulocytes: 0.02 10*3/uL (ref 0.00–0.07)
Basophils Absolute: 0 10*3/uL (ref 0.0–0.1)
Basophils Relative: 1 %
Eosinophils Absolute: 0.1 10*3/uL (ref 0.0–0.5)
Eosinophils Relative: 1 %
HCT: 32.6 % — ABNORMAL LOW (ref 36.0–46.0)
Hemoglobin: 9.8 g/dL — ABNORMAL LOW (ref 12.0–15.0)
Immature Granulocytes: 0 %
Lymphocytes Relative: 24 %
Lymphs Abs: 1.4 10*3/uL (ref 0.7–4.0)
MCH: 23.3 pg — ABNORMAL LOW (ref 26.0–34.0)
MCHC: 30.1 g/dL (ref 30.0–36.0)
MCV: 77.4 fL — ABNORMAL LOW (ref 80.0–100.0)
Monocytes Absolute: 0.7 10*3/uL (ref 0.1–1.0)
Monocytes Relative: 12 %
Neutro Abs: 3.6 10*3/uL (ref 1.7–7.7)
Neutrophils Relative %: 62 %
Platelet Count: 366 10*3/uL (ref 150–400)
RBC: 4.21 MIL/uL (ref 3.87–5.11)
RDW: 20.4 % — ABNORMAL HIGH (ref 11.5–15.5)
WBC Count: 5.8 10*3/uL (ref 4.0–10.5)
nRBC: 0 % (ref 0.0–0.2)

## 2023-04-11 LAB — BASIC METABOLIC PANEL - CANCER CENTER ONLY
Anion gap: 8 (ref 5–15)
BUN: 12 mg/dL (ref 6–20)
CO2: 26 mmol/L (ref 22–32)
Calcium: 9.5 mg/dL (ref 8.9–10.3)
Chloride: 104 mmol/L (ref 98–111)
Creatinine: 0.8 mg/dL (ref 0.44–1.00)
GFR, Estimated: >60 mL/min (ref >60)
Glucose, Bld: 98 mg/dL (ref 70–99)
Potassium: 3.9 mmol/L (ref 3.5–5.1)
Sodium: 138 mmol/L (ref 135–145)

## 2023-04-11 MED ORDER — SODIUM CHLORIDE 0.9 % IV SOLN
200.0000 mg | Freq: Once | INTRAVENOUS | Status: AC
  Administered 2023-04-11: 200 mg via INTRAVENOUS
  Filled 2023-04-11: qty 200

## 2023-04-11 MED ORDER — SODIUM CHLORIDE 0.9 % IV SOLN
Freq: Once | INTRAVENOUS | Status: AC
  Filled 2023-04-11: qty 250

## 2023-04-11 NOTE — Progress Notes (Signed)
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  Accompanied by family.  Kathy Vega 47 y.o.  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  Patient continues to have heavy menstrual cycles.  She is currently being followed by gynecology.  She complains of fatigue and generalized weakness along with shortness of breath on exertion.   No blood in stools.  Review of Systems  Constitutional:  Positive for malaise/fatigue. Negative for chills, diaphoresis, fever and weight loss.  HENT:  Negative for nosebleeds and sore throat.   Eyes:  Negative for double vision.  Respiratory:  Negative for cough, hemoptysis, sputum production, shortness of breath and wheezing.   Cardiovascular:  Negative for chest pain, palpitations, orthopnea and leg swelling.  Gastrointestinal:  Negative for abdominal pain, blood in stool, constipation, diarrhea, heartburn, melena, nausea and vomiting.  Genitourinary:  Negative for dysuria, frequency and urgency.  Musculoskeletal:  Positive for back pain and joint pain.   Skin: Negative.  Negative for itching and rash.  Neurological:  Negative for dizziness, tingling, focal weakness, weakness and headaches.  Endo/Heme/Allergies:  Does not bruise/bleed easily.  Psychiatric/Behavioral:  Negative for depression. The patient is not nervous/anxious and does not have insomnia.     MEDICAL HISTORY:  Past Medical History:  Diagnosis Date   Acute encephalopathy 08/13/2020   Acute renal failure (ARF) (HCC) 08/13/2020   Anemia    Asthma    Cervical cancer (HCC)    Hypothyroidism    Iron deficiency anemia    Obesity (BMI 30-39.9)    Pneumonia due to COVID-19 virus 08/13/2020   requiring trach and prolonged hospitalization   Subglottic stenosis    Tracheal stenosis    Tracheostomy tube present [facility])     SURGICAL HISTORY: Past Surgical History:  Procedure Laterality Date   CARDIAC CATHETERIZATION     CERVICAL BIOPSY  W/ LOOP ELECTRODE EXCISION     CESAREAN SECTION     CHOLECYSTECTOMY     LARYNGOSCOPY  04/16/2021   LARYNGOSCOPY  02/25/2022   LARYNGOSCOPY  08/13/2021   LEG SURGERY     TRACHEOSTOMY TUBE PLACEMENT N/A 09/04/2020   Procedure: TRACHEOSTOMY;  Surgeon: Linus Salmons, MD;  Location: [facility] ORS;  Service: ENT;  Laterality: N/A;   TUBAL LIGATION      SOCIAL HISTORY: Social History   Socioeconomic History   Marital status: Married    Spouse name: Tiburcio Bash   Number of children: 3   Years of education: Not on file   Highest education level: Not on file  Occupational History   Not on file  Tobacco Use   Smoking status: Former    Packs/day: 0.50    Years: 5.00  Additional pack years: 0.00    Total pack years: 2.50    Types: Cigarettes    Quit date: 2000    Years since quitting: 24.3   Smokeless tobacco: Never  Vaping Use   Vaping Use: Never used  Substance and Sexual Activity   Alcohol use: No   Drug use: Never   Sexual activity: Yes    Birth control/protection: Surgical    Comment: BTL  Other Topics Concern   Not on file   Social History Narrative   Not on file   Social Determinants of Health   Financial Resource Strain: Not on file  Food Insecurity: Not on file  Transportation Needs: Not on file  Physical Activity: Not on file  Stress: Not on file  Social Connections: Not on file  Intimate Partner Violence: Not on file    FAMILY HISTORY: Family History  Problem Relation Age of Onset   Hyperlipidemia Mother    Hypertension Mother    Heart disease Father    Liver cancer Father    Breast cancer Neg Hx    Ovarian cancer Neg Hx     ALLERGIES:  has No Known Allergies.  MEDICATIONS:  Current Outpatient Medications  Medication Sig Dispense Refill   acetaminophen (TYLENOL) 500 MG tablet Take 500 mg by mouth every 6 (six) hours as needed for headache, fever, moderate pain or mild pain.     albuterol (PROVENTIL) (2.5 MG/3ML) 0.083% nebulizer solution Take 3 mLs (2.5 mg total) by nebulization every 6 (six) hours as needed for wheezing or shortness of breath. 360 mL 11   ascorbic acid (VITAMIN C) 500 MG tablet Take 1 tablet (500 mg total) by mouth daily. 30 tablet 0   budesonide (PULMICORT) 0.5 MG/2ML nebulizer solution Take 2 mLs (0.5 mg total) by nebulization 2 (two) times daily. 120 mL 5   formoterol (PERFOROMIST) 20 MCG/2ML nebulizer solution Take 2 mLs (20 mcg total) by nebulization 2 (two) times daily. Use before the budesonide, twice a day. 120 mL 11   furosemide (LASIX) 20 MG tablet Take 1 tablet (20 mg total) by mouth daily as needed for edema (shortness of breath). 90 tablet 3   HYDROcodone-acetaminophen (NORCO/VICODIN) 5-325 MG tablet Take 1 tablet by mouth every 4 (four) hours as needed for moderate pain. 12 tablet 0   hydrOXYzine (ATARAX) 10 MG tablet Take 10 mg by mouth 3 (three) times daily.     iron polysaccharides (NIFEREX) 150 MG capsule Take 1 capsule (150 mg total) by mouth daily. 30 capsule 1   levothyroxine (SYNTHROID) 125 MCG tablet Take 250 mcg by mouth daily.     meloxicam (MOBIC)  15 MG tablet Take 1 tablet (15 mg total) by mouth daily. 30 tablet 0   methocarbamol (ROBAXIN) 500 MG tablet Take 1 tablet (500 mg total) by mouth 4 (four) times daily. 30 tablet 0   norethindrone (AYGESTIN) 5 MG tablet Take 10 mg by mouth daily.     nystatin cream (MYCOSTATIN) Apply topically 2 (two) times daily. 30 g 0   OXYGEN Inhale into the lungs at bedtime as needed. Up to 4 liters     pantoprazole (PROTONIX) 40 MG tablet Take 1 tablet (40 mg total) by mouth daily. 30 tablet 2   propranolol (INDERAL) 10 MG tablet Take 1 tablet (10 mg total) by mouth 3 (three) times daily as needed (palpitations). 90 tablet 6   No current facility-administered medications for this visit.    PHYSICAL EXAMINATION:   Vitals:  04/11/23 1407  BP: 139/74  Pulse: 93  Temp: (!) 96.8 F (36 C)  SpO2: 98%    Filed Weights   04/11/23 1407  Weight: 276 lb 6.4 oz (125.4 kg)    Trach in place.   Physical Exam Vitals and nursing note reviewed.  HENT:     Head: Normocephalic and atraumatic.     Mouth/Throat:     Pharynx: Oropharynx is clear.  Eyes:     Extraocular Movements: Extraocular movements intact.     Pupils: Pupils are equal, round, and reactive to light.  Cardiovascular:     Rate and Rhythm: Normal rate and regular rhythm.  Pulmonary:     Comments: Decreased breath sounds bilaterally.  Abdominal:     Palpations: Abdomen is soft.  Musculoskeletal:        General: Normal range of motion.     Cervical back: Normal range of motion.  Skin:    General: Skin is warm.  Neurological:     General: No focal deficit present.     Mental Status: She is alert and oriented to person, place, and time.  Psychiatric:        Behavior: Behavior normal.        Judgment: Judgment normal.      LABORATORY DATA:  I have reviewed the data as listed Lab Results  Component Value Date   WBC 5.8 04/11/2023   HGB 9.8 (L) 04/11/2023   HCT 32.6 (L) 04/11/2023   MCV 77.4 (L) 04/11/2023   PLT 366  04/11/2023   Recent Labs    07/18/22 0429 08/05/22 1227 08/14/22 0954 01/27/23 1358 04/11/23 1408  NA 139 138 139 135 138  K 4.0 4.0 4.1 3.5 3.9  CL 107 107 109 103 104  CO2 26 25 23 25 26   GLUCOSE 114* 105* 127* 130* 98  BUN 8 11 8 8 12   CREATININE 0.79 0.83 0.91 0.95 0.80  CALCIUM 9.2 9.2 9.1 9.3 9.5  GFRNONAA >60 >60 >60 >60 >60  PROT 6.7 7.9 7.6  --   --   ALBUMIN 3.6 4.4 4.4  --   --   AST 19 16 26   --   --   ALT 12 14 26   --   --   ALKPHOS 54 66 68  --   --   BILITOT 0.8 0.4 0.5  --   --      CT Lumbar Spine Wo Contrast  Result Date: 03/30/2023 CLINICAL DATA:  Low back pain, cauda equina syndrome suspected Sudden sharp lower back pain after lifting item a week ago EXAM: CT LUMBAR SPINE WITHOUT CONTRAST TECHNIQUE: Multidetector CT imaging of the lumbar spine was performed without intravenous contrast administration. Multiplanar CT image reconstructions were also generated. RADIATION DOSE REDUCTION: This exam was performed according to the departmental dose-optimization program which includes automated exposure control, adjustment of the mA and/or kV according to patient size and/or use of iterative reconstruction technique. COMPARISON:  None Available. FINDINGS: Segmentation: 5 lumbar type vertebrae. Alignment: Normal. Vertebrae: No acute fracture or focal pathologic process. Paraspinal and other soft tissues: Negative. Disc levels: Maintained. No disc herniation. No central canal stenosis or neural foraminal narrowing. IMPRESSION: Unremarkable study. Electronically Signed   By: Charlett Nose M.D.   On: 03/30/2023 15:51    ASSESSMENT & PLAN:   Iron deficiency #Chronic severe anemia- Hb- 7-8; iron saturation 2% [AUG 2023]-very symptomatic.  Likely secondary to menorrhagia. S/p Venofer- Nov 2023- Hb 11.2; continue PO iron.   #  Today Hb 9.8 - Proceed with venofer today.   #Etiology of iron deficiency: Likely secondary to menorrhagia. S/p Dr. Logan Bores gynecology ;   Nov 13th, 2023.  Down the line consider GI referral.   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  Stable.    # DISPOSITION: # venofer today # venofer weekly x 3  # follow up in 3  months- MD: labs- cbc/bmp;; iron studies; ferritin;possible Venofer- Dr.B  All questions were answered. The patient knows to call the clinic with any problems, questions or concerns.  Earna Coder, MD 04/11/2023 3:05 PM

## 2023-04-11 NOTE — Progress Notes (Signed)
Fatigue/weakness: yes Dyspena: yes Light headedness: no  Blood in stool: no

## 2023-04-11 NOTE — Assessment & Plan Note (Addendum)
#  Chronic severe anemia- Hb- 7-8; iron saturation 2% [AUG 2023]-very symptomatic.  Likely secondary to menorrhagia. S/p Venofer- Nov 2023- Hb 11.2; continue PO iron.   # Today Hb 9.8 - Proceed with venofer today.   #Etiology of iron deficiency: Likely secondary to menorrhagia. S/p Dr. Logan Bores gynecology ;   Nov 13th, 2023. Down the line consider GI referral.   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  Stable.    # DISPOSITION: # venofer today # venofer weekly x 3  # follow up in 3  months- MD: labs- cbc/bmp;; iron studies; ferritin;possible Venofer- Dr.B

## 2023-04-12 ENCOUNTER — Encounter: Payer: Self-pay | Admitting: *Deleted

## 2023-04-12 ENCOUNTER — Encounter: Payer: 59 | Admitting: *Deleted

## 2023-04-12 DIAGNOSIS — J4540 Moderate persistent asthma, uncomplicated: Secondary | ICD-10-CM

## 2023-04-12 NOTE — Progress Notes (Signed)
Pulmonary Individual Treatment Plan  Patient Details  Name: Kathy Vega MRN: [id number] Date of Birth: September 15, 1976 Referring Provider:   Flowsheet Row Pulmonary Rehab from 01/26/2023 in [facility] Cardiac and Pulmonary Rehab  Referring Provider Sarina Ser MD       Initial Encounter Date:  Flowsheet Row Pulmonary Rehab from 01/26/2023 in [facility] Cardiac and Pulmonary Rehab  Date 01/26/23       Visit Diagnosis: Moderate persistent asthma in adult without complication  Patient's Home Medications on Admission:  Current Outpatient Medications:    acetaminophen (TYLENOL) 500 MG tablet, Take 500 mg by mouth every 6 (six) hours as needed for headache, fever, moderate pain or mild pain., Disp: , Rfl:    albuterol (PROVENTIL) (2.5 MG/3ML) 0.083% nebulizer solution, Take 3 mLs (2.5 mg total) by nebulization every 6 (six) hours as needed for wheezing or shortness of breath., Disp: 360 mL, Rfl: 11   ascorbic acid (VITAMIN C) 500 MG tablet, Take 1 tablet (500 mg total) by mouth daily., Disp: 30 tablet, Rfl: 0   budesonide (PULMICORT) 0.5 MG/2ML nebulizer solution, Take 2 mLs (0.5 mg total) by nebulization 2 (two) times daily., Disp: 120 mL, Rfl: 5   formoterol (PERFOROMIST) 20 MCG/2ML nebulizer solution, Take 2 mLs (20 mcg total) by nebulization 2 (two) times daily. Use before the budesonide, twice a day., Disp: 120 mL, Rfl: 11   furosemide (LASIX) 20 MG tablet, Take 1 tablet (20 mg total) by mouth daily as needed for edema (shortness of breath)., Disp: 90 tablet, Rfl: 3   HYDROcodone-acetaminophen (NORCO/VICODIN) 5-325 MG tablet, Take 1 tablet by mouth every 4 (four) hours as needed for moderate pain., Disp: 12 tablet, Rfl: 0   hydrOXYzine (ATARAX) 10 MG tablet, Take 10 mg by mouth 3 (three) times daily., Disp: , Rfl:    iron polysaccharides (NIFEREX) 150 MG capsule, Take 1 capsule (150 mg total) by mouth daily., Disp: 30 capsule, Rfl: 1   levothyroxine (SYNTHROID) 125 MCG tablet, Take 250 mcg by  mouth daily., Disp: , Rfl:    meloxicam (MOBIC) 15 MG tablet, Take 1 tablet (15 mg total) by mouth daily., Disp: 30 tablet, Rfl: 0   methocarbamol (ROBAXIN) 500 MG tablet, Take 1 tablet (500 mg total) by mouth 4 (four) times daily., Disp: 30 tablet, Rfl: 0   norethindrone (AYGESTIN) 5 MG tablet, Take 10 mg by mouth daily., Disp: , Rfl:    nystatin cream (MYCOSTATIN), Apply topically 2 (two) times daily., Disp: 30 g, Rfl: 0   OXYGEN, Inhale into the lungs at bedtime as needed. Up to 4 liters, Disp: , Rfl:    pantoprazole (PROTONIX) 40 MG tablet, Take 1 tablet (40 mg total) by mouth daily., Disp: 30 tablet, Rfl: 2   propranolol (INDERAL) 10 MG tablet, Take 1 tablet (10 mg total) by mouth 3 (three) times daily as needed (palpitations)., Disp: 90 tablet, Rfl: 6  Past Medical History: Past Medical History:  Diagnosis Date   Acute encephalopathy 08/13/2020   Acute renal failure (ARF) (HCC) 08/13/2020   Anemia    Asthma    Cervical cancer (HCC)    Hypothyroidism    Iron deficiency anemia    Obesity (BMI 30-39.9)    Pneumonia due to COVID-19 virus 08/13/2020   requiring trach and prolonged hospitalization   Subglottic stenosis    Tracheal stenosis    Tracheostomy tube present (HCC)     Tobacco Use: Social History   Tobacco Use  Smoking Status Former   Packs/day: 0.50  Years: 5.00   Additional pack years: 0.00   Total pack years: 2.50   Types: Cigarettes   Quit date: 2000   Years since quitting: 24.3  Smokeless Tobacco Never    Labs: Review Flowsheet  More data exists      Latest Ref Rng & Units 08/16/2020 08/17/2020 08/18/2020 08/20/2020 08/31/2020  Labs for ITP Cardiac and Pulmonary Rehab  Trlycerides <150 mg/dL - 2,130  - - -  PH, Arterial 7.350 - 7.450 7.40  - 7.41  7.38  7.42  7.47   PCO2 arterial 32.0 - 48.0 mmHg 44  - 49  56  48  53   Bicarbonate 20.0 - 28.0 mmol/L 27.3  - 31.1  33.1  31.1  38.6   O2 Saturation % 98.8  - 92.3  94.6  93.1  92.5      Pulmonary  Assessment Scores:  Pulmonary Assessment Scores     Row Name 01/26/23 1601         ADL UCSD   ADL Phase Entry     SOB Score total 100     Rest 3     Walk 4     Stairs 5     Bath 3     Dress 5     Shop 5       CAT Score   CAT Score 25       mMRC Score   mMRC Score 4              UCSD: Self-administered rating of dyspnea associated with activities of daily living (ADLs) 6-point scale (0 = "not at all" to 5 = "maximal or unable to do because of breathlessness")  Scoring Scores range from 0 to 120.  Minimally important difference is 5 units  CAT: CAT can identify the health impairment of COPD patients and is better correlated with disease progression.  CAT has a scoring range of zero to 40. The CAT score is classified into four groups of low (less than 10), medium (10 - 20), high (21-30) and very high (31-40) based on the impact level of disease on health status. A CAT score over 10 suggests significant symptoms.  A worsening CAT score could be explained by an exacerbation, poor medication adherence, poor inhaler technique, or progression of COPD or comorbid conditions.  CAT MCID is 2 points  mMRC: mMRC (Modified Medical Research Council) Dyspnea Scale is used to assess the degree of baseline functional disability in patients of respiratory disease due to dyspnea. No minimal important difference is established. A decrease in score of 1 point or greater is considered a positive change.   Pulmonary Function Assessment:  Pulmonary Function Assessment - 01/16/23 1215       Breath   Shortness of Breath Yes;Limiting activity             Exercise Target Goals: Exercise Program Goal: Individual exercise prescription set using results from initial 6 min walk test and THRR while considering  patient's activity barriers and safety.   Exercise Prescription Goal: Initial exercise prescription builds to 30-45 minutes a day of aerobic activity, 2-3 days per week.  Home  exercise guidelines will be given to patient during program as part of exercise prescription that the participant will acknowledge.  Education: Aerobic Exercise: - Group verbal and visual presentation on the components of exercise prescription. Introduces F.I.T.T principle from ACSM for exercise prescriptions.  Reviews F.I.T.T. principles of aerobic exercise including progression. Written material given at graduation.  Education: Resistance Exercise: - Group verbal and visual presentation on the components of exercise prescription. Introduces F.I.T.T principle from ACSM for exercise prescriptions  Reviews F.I.T.T. principles of resistance exercise including progression. Written material given at graduation.    Education: Exercise & Equipment Safety: - Individual verbal instruction and demonstration of equipment use and safety with use of the equipment. Flowsheet Row Pulmonary Rehab from 01/16/2023 in [facility] Cardiac and Pulmonary Rehab  Date 01/16/23  Educator [facility]  Instruction Review Code 1- Verbalizes Understanding       Education: Exercise Physiology & General Exercise Guidelines: - Group verbal and written instruction with models to review the exercise physiology of the cardiovascular system and associated critical values. Provides general exercise guidelines with specific guidelines to those with heart or lung disease.    Education: Flexibility, Balance, Mind/Body Relaxation: - Group verbal and visual presentation with interactive activity on the components of exercise prescription. Introduces F.I.T.T principle from ACSM for exercise prescriptions. Reviews F.I.T.T. principles of flexibility and balance exercise training including progression. Also discusses the mind body connection.  Reviews various relaxation techniques to help reduce and manage stress (i.e. Deep breathing, progressive muscle relaxation, and visualization). Balance handout provided to take home. Written material given at  graduation.   Activity Barriers & Risk Stratification:  Activity Barriers & Cardiac Risk Stratification - 01/26/23 1617       Activity Barriers & Cardiac Risk Stratification   Activity Barriers Back Problems;Deconditioning;Muscular Weakness;Shortness of Breath;Other (comment)    Comments chronic back pain, bilateral knee pain, tracheostomy in place             6 Minute Walk:  6 Minute Walk     Row Name 01/26/23 1626         6 Minute Walk   Phase Initial     Distance 395 feet     Walk Time 5.1 minutes     # of Rest Breaks 5  0.52-1:01; 1:32-1:43; 2:28-2:41; 3:37-3:43; 4:44-4:57     MPH 0.88     METS 1.94     RPE 13     Perceived Dyspnea  3     VO2 Peak 6.8     Symptoms Yes (comment)     Comments Back pain 7/10, SOB, fatigue     Resting HR 90 bpm     Resting BP 118/74     Resting Oxygen Saturation  99 %     Exercise Oxygen Saturation  during 6 min walk 94 %     Max Ex. HR 114 bpm     Max Ex. BP 132/80     2 Minute Post BP 120/76       Interval HR   1 Minute HR 101     2 Minute HR 110     3 Minute HR 112     4 Minute HR 108     5 Minute HR 114     6 Minute HR 111     2 Minute Post HR 87     Interval Heart Rate? Yes       Interval Oxygen   Interval Oxygen? Yes     Baseline Oxygen Saturation % 99 %     1 Minute Oxygen Saturation % 94 %     1 Minute Liters of Oxygen 4 L  trach collar; 28% Fio2     2 Minute Oxygen Saturation % 98 %     2 Minute Liters of Oxygen 4 L     3  Minute Oxygen Saturation % 99 %     3 Minute Liters of Oxygen 4 L     4 Minute Oxygen Saturation % 99 %     4 Minute Liters of Oxygen 4 L     5 Minute Oxygen Saturation % 97 %     5 Minute Liters of Oxygen 4 L     6 Minute Oxygen Saturation % 96 %     6 Minute Liters of Oxygen 4 L     2 Minute Post Oxygen Saturation % 98 %     2 Minute Post Liters of Oxygen 4 L             Oxygen Initial Assessment:  Oxygen Initial Assessment - 01/26/23 1557       Home Oxygen   Home Oxygen  Device E-Tanks;Home Concentrator    Sleep Oxygen Prescription Continuous   Trach Collar   Liters per minute 5    Home Exercise Oxygen Prescription Continuous   28% Fio2- trach collar   Liters per minute 4   28% Fio2 - trach collar   Home Resting Oxygen Prescription Continuous   28% Fio2- trach collar   Liters per minute 4    Compliance with Home Oxygen Use Yes      Initial 6 min Walk   Oxygen Used Continuous    Liters per minute 4    FiO2% 28   trach collar     Program Oxygen Prescription   Program Oxygen Prescription Continuous    Liters per minute 4    FiO2% 28   trach collar   Comments RT met with patient and reviewed information with patient.      Intervention   Short Term Goals To learn and exhibit compliance with exercise, home and travel O2 prescription;To learn and understand importance of monitoring SPO2 with pulse oximeter and demonstrate accurate use of the pulse oximeter.;To learn and demonstrate proper pursed lip breathing techniques or other breathing techniques. ;To learn and understand importance of maintaining oxygen saturations>88%;To learn and demonstrate proper use of respiratory medications    Long  Term Goals Exhibits compliance with exercise, home  and travel O2 prescription;Maintenance of O2 saturations>88%;Compliance with respiratory medication;Demonstrates proper use of MDI's;Exhibits proper breathing techniques, such as pursed lip breathing or other method taught during program session;Verbalizes importance of monitoring SPO2 with pulse oximeter and return demonstration             Oxygen Re-Evaluation:  Oxygen Re-Evaluation     Row Name 03/13/23 1400 04/10/23 1403           Program Oxygen Prescription   Program Oxygen Prescription -- Continuous      Liters per minute -- 4      Comments -- patient wanted to try room air for exercise today.        Home Oxygen   Home Oxygen Device -- E-Tanks;Home Concentrator      Sleep Oxygen Prescription --  Continuous      Liters per minute -- 5      Liters per minute -- 4      Home Resting Oxygen Prescription -- Continuous      Liters per minute -- 4      Compliance with Home Oxygen Use -- Yes        Goals/Expected Outcomes   Short Term Goals To learn and exhibit compliance with exercise, home and travel O2 prescription;To learn and understand importance of monitoring SPO2 with pulse oximeter  and demonstrate accurate use of the pulse oximeter.;To learn and demonstrate proper pursed lip breathing techniques or other breathing techniques. ;To learn and understand importance of maintaining oxygen saturations>88%;To learn and demonstrate proper use of respiratory medications To learn and demonstrate proper pursed lip breathing techniques or other breathing techniques.       Long  Term Goals Exhibits compliance with exercise, home  and travel O2 prescription;Maintenance of O2 saturations>88%;Compliance with respiratory medication;Demonstrates proper use of MDI's;Exhibits proper breathing techniques, such as pursed lip breathing or other method taught during program session;Verbalizes importance of monitoring SPO2 with pulse oximeter and return demonstration Exhibits proper breathing techniques, such as pursed lip breathing or other method taught during program session      Comments Reviewed PLB technique with pt.  Talked about how it works and it's importance in maintaining their exercise saturations. Diaphragmatic and PLB breathing explained and performed with patient. Patient has a better understanding of how to do these exercises to help with breathing performance and relaxation. Patient performed breathing techniques adequately and to practice further at home. Patient would like to titrate her oxygen today and see how her oxygen saturations are while exercising.      Goals/Expected Outcomes Short: Become more profiecient at using PLB.   Long: Become independent at using PLB. Short: practice PLB and  diaphragmatic breathing at home. Long: Use PLB, titrate oxygen and diaphragmatic breathing independently post [employer].               Oxygen Discharge (Final Oxygen Re-Evaluation):  Oxygen Re-Evaluation - 04/10/23 1403       Program Oxygen Prescription   Program Oxygen Prescription Continuous    Liters per minute 4    Comments patient wanted to try room air for exercise today.      Home Oxygen   Home Oxygen Device E-Tanks;Home Concentrator    Sleep Oxygen Prescription Continuous    Liters per minute 5    Liters per minute 4    Home Resting Oxygen Prescription Continuous    Liters per minute 4    Compliance with Home Oxygen Use Yes      Goals/Expected Outcomes   Short Term Goals To learn and demonstrate proper pursed lip breathing techniques or other breathing techniques.     Long  Term Goals Exhibits proper breathing techniques, such as pursed lip breathing or other method taught during program session    Comments Diaphragmatic and PLB breathing explained and performed with patient. Patient has a better understanding of how to do these exercises to help with breathing performance and relaxation. Patient performed breathing techniques adequately and to practice further at home. Patient would like to titrate her oxygen today and see how her oxygen saturations are while exercising.    Goals/Expected Outcomes Short: practice PLB and diaphragmatic breathing at home. Long: Use PLB, titrate oxygen and diaphragmatic breathing independently post [employer].             Initial Exercise Prescription:  Initial Exercise Prescription - 01/26/23 1600       Date of Initial Exercise RX and Referring Provider   Date 01/26/23    Referring Provider Sarina Ser MD      Oxygen   Oxygen Continuous    Liters 4   Trach collar; 28% Fio2   Maintain Oxygen Saturation 88% or higher      Recumbant Bike   Level 1    RPM 50    Watts 15    Minutes 15  METs 1.9      T5 Nustep   Level  1    SPM 80    Minutes 15    METs 1.9      Biostep-RELP   Level 1    SPM 50    Minutes 15    METs 1.9      Track   Laps 4   Breaks as needed   Minutes 15    METs 1.1      Prescription Details   Frequency (times per week) 2    Duration Progress to 30 minutes of continuous aerobic without signs/symptoms of physical distress      Intensity   THRR 40-80% of Max Heartrate 123- 156    Ratings of Perceived Exertion 11-13    Perceived Dyspnea 0-4      Progression   Progression Continue to progress workloads to maintain intensity without signs/symptoms of physical distress.      Resistance Training   Training Prescription Yes    Weight 3 lb   no overhead exercises; relayed to patient   Reps 10-15             Perform Capillary Blood Glucose checks as needed.  Exercise Prescription Changes:   Exercise Prescription Changes     Row Name 01/26/23 1600 03/16/23 1400 03/29/23 1300         Response to Exercise   Blood Pressure (Admit) 118/74 132/80 136/82     Blood Pressure (Exercise) 132/80 158/70 138/70     Blood Pressure (Exit) 120/76 130/80 140/70     Heart Rate (Admit) 90 bpm 114 bpm 102 bpm     Heart Rate (Exercise) 114 bpm 110 bpm 134 bpm     Heart Rate (Exit) 87 bpm 106 bpm 109 bpm     Oxygen Saturation (Admit) 99 % 92 % 98 %     Oxygen Saturation (Exercise) 94 % 96 % 95 %     Oxygen Saturation (Exit) 98 % 97 % 97 %     Rating of Perceived Exertion (Exercise) 13 11 13      Perceived Dyspnea (Exercise) 3 1 3      Symptoms SOB, fatigue, back pain 7/10 SOB SOB     Comments walk test results First full day of exercise --     Duration -- Progress to 30 minutes of  aerobic without signs/symptoms of physical distress Progress to 30 minutes of  aerobic without signs/symptoms of physical distress     Intensity -- THRR unchanged THRR unchanged       Progression   Progression -- Continue to progress workloads to maintain intensity without signs/symptoms of physical  distress. Continue to progress workloads to maintain intensity without signs/symptoms of physical distress.     Average METs -- 1.8 1.53       Resistance Training   Training Prescription -- Yes Yes     Weight -- 3 lb  no overhead exercises; relayed to patient 3 lb     Reps -- 10-15 10-15       Oxygen   Oxygen -- Continuous Continuous     Liters -- 3 3  Trach collar       T5 Nustep   Level -- 1 --     Minutes -- 30 --     METs -- 1.8 --       Biostep-RELP   Level -- -- 1     Minutes -- -- 15     METs -- --  2       Track   Laps -- -- 10     Minutes -- -- 15     METs -- -- 1.54       Oxygen   Maintain Oxygen Saturation -- 88% or higher 88% or higher              Exercise Comments:   Exercise Comments     Row Name 03/13/23 1400           Exercise Comments First full day of exercise!  Patient was oriented to gym and equipment including functions, settings, policies, and procedures.  Patient's individual exercise prescription and treatment plan were reviewed.  All starting workloads were established based on the results of the 6 minute walk test done at initial orientation visit.  The plan for exercise progression was also introduced and progression will be customized based on patient's performance and goals.                Exercise Goals and Review:   Exercise Goals     Row Name 01/26/23 1645             Exercise Goals   Increase Physical Activity Yes       Intervention Develop an individualized exercise prescription for aerobic and resistive training based on initial evaluation findings, risk stratification, comorbidities and participant's personal goals.;Provide advice, education, support and counseling about physical activity/exercise needs.       Expected Outcomes Short Term: Attend rehab on a regular basis to increase amount of physical activity.;Long Term: Add in home exercise to make exercise part of routine and to increase amount of physical  activity.;Long Term: Exercising regularly at least 3-5 days a week.       Increase Strength and Stamina Yes       Intervention Provide advice, education, support and counseling about physical activity/exercise needs.       Expected Outcomes Short Term: Perform resistance training exercises routinely during rehab and add in resistance training at home;Long Term: Improve cardiorespiratory fitness, muscular endurance and strength as measured by increased METs and functional capacity ( );Short Term: Increase workloads from initial exercise prescription for resistance, speed, and METs.       Able to understand and use rate of perceived exertion (RPE) scale Yes       Intervention Provide education and explanation on how to use RPE scale       Expected Outcomes Short Term: Able to use RPE daily in rehab to express subjective intensity level;Long Term:  Able to use RPE to guide intensity level when exercising independently       Able to understand and use Dyspnea scale Yes       Intervention Provide education and explanation on how to use Dyspnea scale       Expected Outcomes Long Term: Able to use Dyspnea scale to guide intensity level when exercising independently;Short Term: Able to use Dyspnea scale daily in rehab to express subjective sense of shortness of breath during exertion       Knowledge and understanding of Target Heart Rate Range (THRR) Yes       Intervention Provide education and explanation of THRR including how the numbers were predicted and where they are located for reference       Expected Outcomes Short Term: Able to state/look up THRR;Long Term: Able to use THRR to govern intensity when exercising independently;Short Term: Able to use daily as guideline for intensity in rehab  Able to check pulse independently Yes       Intervention Provide education and demonstration on how to check pulse in carotid and radial arteries.;Review the importance of being able to check your own pulse  for safety during independent exercise       Expected Outcomes Long Term: Able to check pulse independently and accurately;Short Term: Able to explain why pulse checking is important during independent exercise       Understanding of Exercise Prescription Yes       Intervention Provide education, explanation, and written materials on patient's individual exercise prescription       Expected Outcomes Short Term: Able to explain program exercise prescription;Long Term: Able to explain home exercise prescription to exercise independently                Exercise Goals Re-Evaluation :  Exercise Goals Re-Evaluation     Row Name 03/13/23 1400 03/16/23 1451 03/29/23 1341         Exercise Goal Re-Evaluation   Exercise Goals Review Increase Physical Activity;Able to understand and use rate of perceived exertion (RPE) scale;Knowledge and understanding of Target Heart Rate Range (THRR);Understanding of Exercise Prescription;Increase Strength and Stamina;Able to check pulse independently;Able to understand and use Dyspnea scale Increase Physical Activity;Increase Strength and Stamina;Understanding of Exercise Prescription Increase Physical Activity;Increase Strength and Stamina;Understanding of Exercise Prescription     Comments Reviewed RPE scale, THR and program prescription with pt today.  Pt voiced understanding and was given a copy of goals to take home. Shekeria is off to a good start in rehab. During her first session she was able to work at level 1 on the T5 nustep. She also did well with 3 lb hand weights for resistance training. We will continue to monitor her progress in the program. Rodney Booze completed 2 more sessions since last review, she was not able to attend the other sessions as she had to call out. She tried out the Biostep at level 1 and was able to walk 10 laps on the track! Patient has a trach to work with during exercise and has back up supplies with her here. Her oxygen saturations are  staying nicely above 88%. We will continue to monitor,     Expected Outcomes Short: Use RPE daily to regulate intensity.  Long: Follow program prescription in THR. Short: Continue to attend rehab consistently. Long: Continue to follow current exercise prescription. Short: Continue working in increasing pace on track to increase laps Long: Continue to increae overall MET level and stamina              Discharge Exercise Prescription (Final Exercise Prescription Changes):  Exercise Prescription Changes - 03/29/23 1300       Response to Exercise   Blood Pressure (Admit) 136/82    Blood Pressure (Exercise) 138/70    Blood Pressure (Exit) 140/70    Heart Rate (Admit) 102 bpm    Heart Rate (Exercise) 134 bpm    Heart Rate (Exit) 109 bpm    Oxygen Saturation (Admit) 98 %    Oxygen Saturation (Exercise) 95 %    Oxygen Saturation (Exit) 97 %    Rating of Perceived Exertion (Exercise) 13    Perceived Dyspnea (Exercise) 3    Symptoms SOB    Duration Progress to 30 minutes of  aerobic without signs/symptoms of physical distress    Intensity THRR unchanged      Progression   Progression Continue to progress workloads to maintain intensity without signs/symptoms of  physical distress.    Average METs 1.53      Resistance Training   Training Prescription Yes    Weight 3 lb    Reps 10-15      Oxygen   Oxygen Continuous    Liters 3   Trach collar     Biostep-RELP   Level 1    Minutes 15    METs 2      Track   Laps 10    Minutes 15    METs 1.54      Oxygen   Maintain Oxygen Saturation 88% or higher             Nutrition:  Target Goals: Understanding of nutrition guidelines, daily intake of sodium 1500mg , cholesterol 200mg , calories 30% from fat and 7% or less from saturated fats, daily to have 5 or more servings of fruits and vegetables.  Education: All About Nutrition: -Group instruction provided by verbal, written material, interactive activities, discussions,  models, and posters to present general guidelines for heart healthy nutrition including fat, fiber, MyPlate, the role of sodium in heart healthy nutrition, utilization of the nutrition label, and utilization of this knowledge for meal planning. Follow up email sent as well. Written material given at graduation.   Biometrics:  Pre Biometrics - 01/26/23 1624       Pre Biometrics   Height 5\' 9"  (1.753 m)    Weight 275 lb 4.8 oz (124.9 kg)    Waist Circumference 51 inches    Hip Circumference 52.5 inches    Waist to Hip Ratio 0.97 %    BMI (Calculated) 40.64    Single Leg Stand 14.2 seconds              Nutrition Therapy Plan and Nutrition Goals:  Nutrition Therapy & Goals - 01/26/23 1603       Nutrition Therapy   RD appointment deferred Yes   Deferred 01/26/23            Nutrition Assessments:  MEDIFICTS Score Key: ?70 Need to make dietary changes  40-70 Heart Healthy Diet ? 40 Therapeutic Level Cholesterol Diet  Flowsheet Row Pulmonary Rehab from 01/26/2023 in [facility] Cardiac and Pulmonary Rehab  Picture Your Plate Total Score on Admission 47      Picture Your Plate Scores: <98 Unhealthy dietary pattern with much room for improvement. 41-50 Dietary pattern unlikely to meet recommendations for good health and room for improvement. 51-60 More healthful dietary pattern, with some room for improvement.  >60 Healthy dietary pattern, although there may be some specific behaviors that could be improved.   Nutrition Goals Re-Evaluation:  Nutrition Goals Re-Evaluation     Row Name 04/10/23 1406             Goals   Current Weight 274 lb (124.3 kg)       Nutrition Goal eat smaller portions.       Comment Patient was informed on why it is important to maintain a balanced diet when dealing with Respiratory issues. Explained that it takes a lot of energy to breath and when they are short of breath often they will need to have a good diet to help keep up with the calories  they are expending for breathing.       Expected Outcome Short: Choose and plan snacks accordingly to patients caloric intake to improve breathing. Long: Maintain a diet independently that meets their caloric intake to aid in daily shortness of breath.  Nutrition Goals Discharge (Final Nutrition Goals Re-Evaluation):  Nutrition Goals Re-Evaluation - 04/10/23 1406       Goals   Current Weight 274 lb (124.3 kg)    Nutrition Goal eat smaller portions.    Comment Patient was informed on why it is important to maintain a balanced diet when dealing with Respiratory issues. Explained that it takes a lot of energy to breath and when they are short of breath often they will need to have a good diet to help keep up with the calories they are expending for breathing.    Expected Outcome Short: Choose and plan snacks accordingly to patients caloric intake to improve breathing. Long: Maintain a diet independently that meets their caloric intake to aid in daily shortness of breath.             Psychosocial: Target Goals: Acknowledge presence or absence of significant depression and/or stress, maximize coping skills, provide positive support system. Participant is able to verbalize types and ability to use techniques and skills needed for reducing stress and depression.   Education: Stress, Anxiety, and Depression - Group verbal and visual presentation to define topics covered.  Reviews how body is impacted by stress, anxiety, and depression.  Also discusses healthy ways to reduce stress and to treat/manage anxiety and depression.  Written material given at graduation.   Education: Sleep Hygiene -Provides group verbal and written instruction about how sleep can affect your health.  Define sleep hygiene, discuss sleep cycles and impact of sleep habits. Review good sleep hygiene tips.    Initial Review & Psychosocial Screening:  Initial Psych Review & Screening - 01/16/23 1217        Initial Review   Current issues with Current Stress Concerns    Source of Stress Concerns Chronic Illness    Comments Patient states that she has her kids and husband for support. Other parts of her family can be caled for support but not often. She wants to get her health back on track and breath better.      Family Dynamics   Good Support System? Yes    Comments Short: Start [facility] to help with mood. Long: Maintain a healthy mental state.      Screening Interventions   Interventions Encouraged to exercise;To provide support and resources with identified psychosocial needs;Provide feedback about the scores to participant    Expected Outcomes Short Term goal: Utilizing psychosocial counselor, staff and physician to assist with identification of specific Stressors or current issues interfering with healing process. Setting desired goal for each stressor or current issue identified.;Long Term Goal: Stressors or current issues are controlled or eliminated.;Short Term goal: Identification and review with participant of any Quality of Life or Depression concerns found by scoring the questionnaire.;Long Term goal: The participant improves quality of Life and PHQ9 Scores as seen by post scores and/or verbalization of changes             Quality of Life Scores:  Scores of 19 and below usually indicate a poorer quality of life in these areas.  A difference of  2-3 points is a clinically meaningful difference.  A difference of 2-3 points in the total score of the Quality of Life Index has been associated with significant improvement in overall quality of life, self-image, physical symptoms, and general health in studies assessing change in quality of life.  PHQ-9: Review Flowsheet       04/10/2023 01/26/2023  Depression screen PHQ 2/9  Decreased Interest 2 3  Down, Depressed, Hopeless 0 1  PHQ - 2 Score 2 4  Altered sleeping 0 0  Tired, decreased energy 3 3  Change in appetite 0 0   Feeling bad or failure about yourself  0 0  Trouble concentrating 0 0  Moving slowly or fidgety/restless 0 0  Suicidal thoughts 0 0  PHQ-9 Score 5 7  Difficult doing work/chores Somewhat difficult Somewhat difficult   Interpretation of Total Score  Total Score Depression Severity:  1-4 = Minimal depression, 5-9 = Mild depression, 10-14 = Moderate depression, 15-19 = Moderately severe depression, 20-27 = Severe depression   Psychosocial Evaluation and Intervention:  Psychosocial Evaluation - 01/16/23 1220       Psychosocial Evaluation & Interventions   Interventions Stress management education;Relaxation education;Encouraged to exercise with the program and follow exercise prescription    Comments Patient states that she has her kids and husband for support. Other parts of her family can be caled for support but not often. She wants to get her health back on track and breath better.    Expected Outcomes Short: Start [facility] to help with mood. Long: Maintain a healthy mental state.    Continue Psychosocial Services  Follow up required by staff             Psychosocial Re-Evaluation:  Psychosocial Re-Evaluation     Row Name 04/10/23 1411             Psychosocial Re-Evaluation   Current issues with History of Depression       Comments Reviewed patient health questionnaire (PHQ-9) with patient for follow up. Previously, patients score indicated signs/symptoms of depression.  Reviewed to see if patient is improving symptom wise while in program.  Score improved and patient states that it is because she has been able to control herhealth more.       Expected Outcomes Short: Continue to attend [facility] regularly for regular exercise and social engagement. Long: Continue to improve symptoms and manage a positive mental state.       Interventions Encouraged to attend Pulmonary Rehabilitation for the exercise       Continue Psychosocial Services  Follow up required by staff                 Psychosocial Discharge (Final Psychosocial Re-Evaluation):  Psychosocial Re-Evaluation - 04/10/23 1411       Psychosocial Re-Evaluation   Current issues with History of Depression    Comments Reviewed patient health questionnaire (PHQ-9) with patient for follow up. Previously, patients score indicated signs/symptoms of depression.  Reviewed to see if patient is improving symptom wise while in program.  Score improved and patient states that it is because she has been able to control herhealth more.    Expected Outcomes Short: Continue to attend [facility] regularly for regular exercise and social engagement. Long: Continue to improve symptoms and manage a positive mental state.    Interventions Encouraged to attend Pulmonary Rehabilitation for the exercise    Continue Psychosocial Services  Follow up required by staff             Education: Education Goals: Education classes will be provided on a weekly basis, covering required topics. Participant will state understanding/return demonstration of topics presented.  Learning Barriers/Preferences:  Learning Barriers/Preferences - 01/16/23 1215       Learning Barriers/Preferences   Learning Barriers None    Learning Preferences None             General Pulmonary Education Topics:  Infection Prevention: - Provides verbal and written material to individual with discussion of infection control including proper hand washing and proper equipment cleaning during exercise session. Flowsheet Row Pulmonary Rehab from 01/16/2023 in [facility] Cardiac and Pulmonary Rehab  Date 01/16/23  Educator [facility]  Instruction Review Code 1- Verbalizes Understanding       Falls Prevention: - Provides verbal and written material to individual with discussion of falls prevention and safety. Flowsheet Row Pulmonary Rehab from 01/16/2023 in [facility] Cardiac and Pulmonary Rehab  Date 01/16/23  Educator [facility]  Instruction Review Code 1- Verbalizes  Understanding       Chronic Lung Disease Review: - Group verbal instruction with posters, models, PowerPoint presentations and videos,  to review new updates, new respiratory medications, new advancements in procedures and treatments. Providing information on websites and "800" numbers for continued self-education. Includes information about supplement oxygen, available portable oxygen systems, continuous and intermittent flow rates, oxygen safety, concentrators, and Medicare reimbursement for oxygen. Explanation of Pulmonary Drugs, including class, frequency, complications, importance of spacers, rinsing mouth after steroid MDI's, and proper cleaning methods for nebulizers. Review of basic lung anatomy and physiology related to function, structure, and complications of lung disease. Review of risk factors. Discussion about methods for diagnosing sleep apnea and types of masks and machines for OSA. Includes a review of the use of types of environmental controls: home humidity, furnaces, filters, dust mite/pet prevention, HEPA vacuums. Discussion about weather changes, air quality and the benefits of nasal washing. Instruction on Warning signs, infection symptoms, calling MD promptly, preventive modes, and value of vaccinations. Review of effective airway clearance, coughing and/or vibration techniques. Emphasizing that all should Create an Action Plan. Written material given at graduation. Flowsheet Row Pulmonary Rehab from 01/26/2023 in [facility] Cardiac and Pulmonary Rehab  Education need identified 01/26/23       AED/CPR: - Group verbal and written instruction with the use of models to demonstrate the basic use of the AED with the basic ABC's of resuscitation.    Anatomy and Cardiac Procedures: - Group verbal and visual presentation and models provide information about basic cardiac anatomy and function. Reviews the testing methods done to diagnose heart disease and the outcomes of the test results.  Describes the treatment choices: Medical Management, Angioplasty, or Coronary Bypass Surgery for treating various heart conditions including Myocardial Infarction, Angina, Valve Disease, and Cardiac Arrhythmias.  Written material given at graduation.   Medication Safety: - Group verbal and visual instruction to review commonly prescribed medications for heart and lung disease. Reviews the medication, class of the drug, and side effects. Includes the steps to properly store meds and maintain the prescription regimen.  Written material given at graduation.   Other: -Provides group and verbal instruction on various topics (see comments)   Knowledge Questionnaire Score:  Knowledge Questionnaire Score - 01/26/23 1557       Knowledge Questionnaire Score   Pre Score 15/18              Core Components/Risk Factors/Patient Goals at Admission:  Personal Goals and Risk Factors at Admission - 01/26/23 1646       Core Components/Risk Factors/Patient Goals on Admission    Weight Management Yes;Weight Loss;Obesity    Intervention Weight Management: Develop a combined nutrition and exercise program designed to reach desired caloric intake, while maintaining appropriate intake of nutrient and fiber, sodium and fats, and appropriate energy expenditure required for the weight goal.;Weight Management: Provide education and appropriate resources to help participant work on and  attain dietary goals.;Weight Management/Obesity: Establish reasonable short term and long term weight goals.;Obesity: Provide education and appropriate resources to help participant work on and attain dietary goals.    Admit Weight 275 lb (124.7 kg)    Goal Weight: Short Term 270 lb (122.5 kg)    Goal Weight: Long Term 225 lb (102.1 kg)    Expected Outcomes Short Term: Continue to assess and modify interventions until short term weight is achieved;Long Term: Adherence to nutrition and physical activity/exercise program aimed  toward attainment of established weight goal;Weight Loss: Understanding of general recommendations for a balanced deficit meal plan, which promotes 1-2 lb weight loss per week and includes a negative energy balance of [phone] kcal/d;Understanding recommendations for meals to include 15-35% energy as protein, 25-35% energy from fat, 35-60% energy from carbohydrates, less than 200mg  of dietary cholesterol, 20-35 gm of total fiber daily;Understanding of distribution of calorie intake throughout the day with the consumption of 4-5 meals/snacks    Improve shortness of breath with ADL's Yes    Intervention Provide education, individualized exercise plan and daily activity instruction to help decrease symptoms of SOB with activities of daily living.    Expected Outcomes Short Term: Improve cardiorespiratory fitness to achieve a reduction of symptoms when performing ADLs;Long Term: Be able to perform more ADLs without symptoms or delay the onset of symptoms    Increase knowledge of respiratory medications and ability to use respiratory devices properly  Yes    Intervention Provide education and demonstration as needed of appropriate use of medications, inhalers, and oxygen therapy.    Expected Outcomes Short Term: Achieves understanding of medications use. Understands that oxygen is a medication prescribed by physician. Demonstrates appropriate use of inhaler and oxygen therapy.;Long Term: Maintain appropriate use of medications, inhalers, and oxygen therapy.             Education:Diabetes - Individual verbal and written instruction to review signs/symptoms of diabetes, desired ranges of glucose level fasting, after meals and with exercise. Acknowledge that pre and post exercise glucose checks will be done for 3 sessions at entry of program.   Know Your Numbers and Heart Failure: - Group verbal and visual instruction to discuss disease risk factors for cardiac and pulmonary disease and treatment options.   Reviews associated critical values for Overweight/Obesity, Hypertension, Cholesterol, and Diabetes.  Discusses basics of heart failure: signs/symptoms and treatments.  Introduces Heart Failure Zone chart for action plan for heart failure.  Written material given at graduation.   Core Components/Risk Factors/Patient Goals Review:   Goals and Risk Factor Review     Row Name 04/10/23 1405             Core Components/Risk Factors/Patient Goals Review   Personal Goals Review Improve shortness of breath with ADL's       Review Spoke to patient about their shortness of breath and what they can do to improve. Patient has been informed of breathing techniques when starting the program. Patient is informed to tell staff if they have had any med changes and that certain meds they are taking or not taking can be causing shortness of breath.       Expected Outcomes Short: Attend [facility] regularly to improve shortness of breath with ADL's. Long: maintain independence with ADL's                Core Components/Risk Factors/Patient Goals at Discharge (Final Review):   Goals and Risk Factor Review - 04/10/23 1405  Core Components/Risk Factors/Patient Goals Review   Personal Goals Review Improve shortness of breath with ADL's    Review Spoke to patient about their shortness of breath and what they can do to improve. Patient has been informed of breathing techniques when starting the program. Patient is informed to tell staff if they have had any med changes and that certain meds they are taking or not taking can be causing shortness of breath.    Expected Outcomes Short: Attend [facility] regularly to improve shortness of breath with ADL's. Long: maintain independence with ADL's             ITP Comments:  ITP Comments     Row Name 01/16/23 1212 01/26/23 1553 02/15/23 0932 03/13/23 1359 03/15/23 1401   ITP Comments Virtual Visit completed. Patient informed on EP and RD appointment and 6  Minute walk test. Patient also informed of patient health questionnaires on My Chart. Patient Verbalizes understanding. Visit diagnosis can be found in [facility] 12/27/2022. Completed and gym orientation. Initial ITP created and sent for review to Dr. Vida Rigger,  Medical Director. 30 Day review completed. Medical Director ITP review done, changes made as directed, and signed approval by Medical Director.    new to program First full day of exercise!  Patient was oriented to gym and equipment including functions, settings, policies, and procedures.  Patient's individual exercise prescription and treatment plan were reviewed.  All starting workloads were established based on the results of the 6 minute walk test done at initial orientation visit.  The plan for exercise progression was also introduced and progression will be customized based on patient's performance and goals. 30 day review completed. ITP sent to Dr. Jinny Sanders, Medical Director of  Pulmonary Rehab. Continue with ITP unless changes are made by physician. First day of exercise was just this week, pt delayed her start.    Row Name 04/04/23 1159 04/12/23 0936         ITP Comments Valeria called to let us know that she was still have significant back pain.  She went to ED for it and was given a shot and pain killers.  She has been resting and icing.  She wants to give it until Monday 5/13 to try again. 30 Day review completed. Medical Director ITP review done, changes made as directed, and signed approval by Medical Director.               Comments:

## 2023-04-12 NOTE — Progress Notes (Signed)
Daily Session Note  Patient Details  Name: Kathy Vega MRN: [id number] Date of Birth: 12/13/1975 Referring Provider:   Flowsheet Row Pulmonary Rehab from 01/26/2023 in [facility] Cardiac and Pulmonary Rehab  Referring Provider Sarina Ser MD       Encounter Date: 04/12/2023  Check In:  Session Check In - 04/12/23 1424       Check-In   Supervising physician immediately available to respond to emergencies See telemetry face sheet for immediately available ER MD    Location [facility]-Cardiac & Pulmonary Rehab    Staff Present Susann Givens, RN Atilano Median, RN, ADN;Laureen Manson Passey, BS, RRT, CPFT    Virtual Visit No    Medication changes reported     No    Fall or balance concerns reported    No    Warm-up and Cool-down Performed on first and last piece of equipment    Resistance Training Performed Yes    VAD Patient? No    PAD/SET Patient? No                Social History   Tobacco Use  Smoking Status Former   Packs/day: 0.50   Years: 5.00   Additional pack years: 0.00   Total pack years: 2.50   Types: Cigarettes   Quit date: 2000   Years since quitting: 24.3  Smokeless Tobacco Never    Goals Met:  Independence with exercise equipment Exercise tolerated well No report of concerns or symptoms today Strength training completed today  Goals Unmet:  Not Applicable  Comments: Pt able to follow exercise prescription today without complaint.  Will continue to monitor for progression.    Dr. Bethann Punches is Medical Director for [facility] Cardiac Rehabilitation.  Dr. Vida Rigger is Medical Director for [facility] Pulmonary Rehabilitation.

## 2023-04-16 NOTE — Progress Notes (Signed)
Cardiology Office Note  Date:  04/17/2023   ID:  Lynsay, Cholewa Jun 29, 1976, MRN [id number]  PCP:  [facility]   Chief Complaint  Patient presents with   6 month follow up     Patient c/o palpitations & sharp pain on left side of breast that comes and goes. Medications reviewed by the patient verbally.     HPI:  [name] is a 47 year old woman with past medical history of Admitted to the hospital 08/13/20 with COVID-19 pneumonia & ARDS requiring intubation on 9/17 and mechanical ventilation.   Proning protocols were followed while in ICU.   required maximal ventilation support with sedation and remained critically ill.  She was weaned to FiO2 60% on 10/1.  Tracheostomy was placed on 09/04/20.  Patient began tolerating trach collar O2 in short durations 10/12-13 and began OOB to chair.   tracheal aspirate culture growing Pseudomonas. Sepsis resolved before discharge. history of morbid obesity, Who presents for follow-up of her shortness of breath, mildly decreased ejection fraction September 2022 in the setting of anemia, heavy menses,  leg edema, chest pain, irregular beats  Last seen in clinic May 2023 Seen by one of our providers November 2023, preop for IUD insertion Echo May 2023 EF 50 to 55%  Wants trach out, later this year in November Trying to lose weight in preparation  Still with heavy menses, started on new medication Persistent anemia treated with iron infusions, last last week Hemoglobin 9.8  Continues to have symptoms from PVCs Has propranolol as needed   emergency room Apr 01, 2022 for palpitations Blood pressure was elevated 143/110 Work-up negative in the ER, started on propranolol 10 twice daily  EKG personally reviewed by myself on todays visit Normal sinus rhythm rate 98 bpm nonspecific T wave abnormality nonspecific T wave abnormality precordial leads  Other past medical history reviewed Oxygen machine at night  Problems  with anemia, hemoglobin down in the 6 range requiring transfusion during September 2022   PMH:   has a past medical history of Acute encephalopathy (08/13/2020), Acute renal failure (ARF) (HCC) (08/13/2020), Anemia, Asthma, Cervical cancer (HCC), Hypothyroidism, Iron deficiency anemia, Obesity (BMI 30-39.9), Pneumonia due to COVID-19 virus (08/13/2020), Subglottic stenosis, Tracheal stenosis, and Tracheostomy tube present (HCC).  PSH:    Past Surgical History:  Procedure Laterality Date   CARDIAC CATHETERIZATION     CERVICAL BIOPSY  W/ LOOP ELECTRODE EXCISION     CESAREAN SECTION     CHOLECYSTECTOMY     LARYNGOSCOPY  04/16/2021   LARYNGOSCOPY  02/25/2022   LARYNGOSCOPY  08/13/2021   LEG SURGERY     TRACHEOSTOMY TUBE PLACEMENT N/A 09/04/2020   Procedure: TRACHEOSTOMY;  Surgeon: Linus Salmons, MD;  Location: [facility] ORS;  Service: ENT;  Laterality: N/A;   TUBAL LIGATION      Current Outpatient Medications  Medication Sig Dispense Refill   acetaminophen (TYLENOL) 500 MG tablet Take 500 mg by mouth every 6 (six) hours as needed for headache, fever, moderate pain or mild pain.     albuterol (PROVENTIL) (2.5 MG/3ML) 0.083% nebulizer solution Take 3 mLs (2.5 mg total) by nebulization every 6 (six) hours as needed for wheezing or shortness of breath. 360 mL 11   ascorbic acid (VITAMIN C) 500 MG tablet Take 1 tablet (500 mg total) by mouth daily. 30 tablet 0   budesonide (PULMICORT) 0.5 MG/2ML nebulizer solution Take 2 mLs (0.5 mg total) by nebulization 2 (two) times daily. 120 mL 5  formoterol (PERFOROMIST) 20 MCG/2ML nebulizer solution Take 2 mLs (20 mcg total) by nebulization 2 (two) times daily. Use before the budesonide, twice a day. 120 mL 11   furosemide (LASIX) 20 MG tablet Take 1 tablet (20 mg total) by mouth daily as needed for edema (shortness of breath). 90 tablet 3   HYDROcodone-acetaminophen (NORCO/VICODIN) 5-325 MG tablet Take 1 tablet by mouth every 4 (four) hours as needed for  moderate pain. 12 tablet 0   hydrOXYzine (ATARAX) 10 MG tablet Take 10 mg by mouth 3 (three) times daily.     iron polysaccharides (NIFEREX) 150 MG capsule Take 1 capsule (150 mg total) by mouth daily. 30 capsule 1   levothyroxine (SYNTHROID) 125 MCG tablet Take 250 mcg by mouth daily.     meloxicam (MOBIC) 15 MG tablet Take 1 tablet (15 mg total) by mouth daily. 30 tablet 0   methocarbamol (ROBAXIN) 500 MG tablet Take 1 tablet (500 mg total) by mouth 4 (four) times daily. 30 tablet 0   norethindrone (AYGESTIN) 5 MG tablet Take 10 mg by mouth daily.     nystatin cream (MYCOSTATIN) Apply topically 2 (two) times daily. 30 g 0   OXYGEN Inhale into the lungs at bedtime as needed. Up to 4 liters     pantoprazole (PROTONIX) 40 MG tablet Take 1 tablet (40 mg total) by mouth daily. 30 tablet 2   propranolol (INDERAL) 10 MG tablet Take 1 tablet (10 mg total) by mouth 3 (three) times daily as needed (palpitations). 90 tablet 6   No current facility-administered medications for this visit.    Allergies:   Patient has no known allergies.   Social History:  The patient  reports that she quit smoking about 24 years ago. Her smoking use included cigarettes. She has a 2.50 pack-year smoking history. She has never used smokeless tobacco. She reports that she does not drink alcohol and does not use drugs.   Family History:   family history includes Heart disease in her father; Hyperlipidemia in her mother; Hypertension in her mother; Liver cancer in her father.    Review of Systems: Review of Systems  Constitutional: Negative.   HENT: Negative.    Respiratory: Negative.    Cardiovascular: Negative.   Gastrointestinal: Negative.   Musculoskeletal: Negative.   Neurological: Negative.   Psychiatric/Behavioral: Negative.    All other systems reviewed and are negative.   PHYSICAL EXAM: VS:  BP 120/80 (BP Location: Left Arm, Patient Position: Sitting, Cuff Size: Large)   Pulse 98   Ht 5\' 9"  (1.753 m)    Wt 272 lb (123.4 kg)   SpO2 98%   BMI 40.17 kg/m  , BMI Body mass index is 40.17 kg/m. Constitutional:  oriented to person, place, and time. No distress.  HENT:  Head: Grossly normal Eyes:  no discharge. No scleral icterus.  Neck: No JVD, no carotid bruits  Cardiovascular: Regular rate and rhythm, no murmurs appreciated Pulmonary/Chest: Clear to auscultation bilaterally, no wheezes or rails Abdominal: Soft.  no distension.  no tenderness.  Musculoskeletal: Normal range of motion Neurological:  normal muscle tone. Coordination normal. No atrophy Skin: Skin warm and dry Psychiatric: normal affect, pleasant  Recent Labs: 04/27/2022: B Natriuretic Peptide 12.3 07/16/2022: TSH 167.262 07/18/2022: Magnesium 2.2 08/14/2022: ALT 26 04/11/2023: BUN 12; Creatinine 0.80; Hemoglobin 9.8; Platelet Count 366; Potassium 3.9; Sodium 138    Lipid Panel Lab Results  Component Value Date   TRIG 1,906 (H) 08/17/2020    Wt Readings from Last  3 Encounters:  04/17/23 272 lb (123.4 kg)  04/11/23 276 lb 6.4 oz (125.4 kg)  03/22/23 280 lb 12.8 oz (127.4 kg)     ASSESSMENT AND PLAN:  Problem List Items Addressed This Visit     Acute renal failure (ARF) (HCC)   Acute respiratory failure (HCC)   Other Visit Diagnoses     Dilated cardiomyopathy (HCC)    -  Primary   SOB (shortness of breath)       Anemia, unspecified type       Morbid obesity (HCC)       Leg edema       Palpitations         Shortness of breath May  Ejection fraction low normal on  echocardiogram May 2023 Symptoms likely multifactorial in the setting of obesity, deconditioning, chronic anemia Appears relatively euvolemic  Anemia Hemoglobin low 9.8 Was hospitalized September 2022 for anemia required transfusion heavy menses Receiving iron infusions  Cardiomyopathy Ejection fraction 50% on echocardiogram  Lasix 20 as above with potassium as needed for ankle swelling Appears relatively euvolemic on today's  visit Blood pressure well-controlled  Irregular heartbeat Zio monitor with PVCs, having symptoms With recommend she start bisoprolol 5 mg daily and continue propranolol as needed for breakthrough palpitations    Total encounter time more than 30 minutes  Greater than 50% was spent in counseling and coordination of care with the patient    Signed, Dossie Arbour, M.D., Ph.D. [facility] [place], [place] [phone]

## 2023-04-17 ENCOUNTER — Ambulatory Visit: Payer: 59 | Attending: Cardiovascular Disease | Admitting: Cardiovascular Disease

## 2023-04-17 ENCOUNTER — Encounter: Payer: 59 | Admitting: *Deleted

## 2023-04-17 ENCOUNTER — Telehealth: Payer: Self-pay | Admitting: Emergency Medicine

## 2023-04-17 ENCOUNTER — Encounter: Payer: Self-pay | Admitting: Cardiovascular Disease

## 2023-04-17 VITALS — BP 120/80 | HR 98 | Ht 69.0 in | Wt 272.0 lb

## 2023-04-17 DIAGNOSIS — R002 Palpitations: Secondary | ICD-10-CM

## 2023-04-17 DIAGNOSIS — J4540 Moderate persistent asthma, uncomplicated: Secondary | ICD-10-CM | POA: Diagnosis not present

## 2023-04-17 DIAGNOSIS — I420 Dilated cardiomyopathy: Secondary | ICD-10-CM | POA: Diagnosis not present

## 2023-04-17 DIAGNOSIS — R0602 Shortness of breath: Secondary | ICD-10-CM

## 2023-04-17 DIAGNOSIS — D649 Anemia, unspecified: Secondary | ICD-10-CM | POA: Diagnosis not present

## 2023-04-17 DIAGNOSIS — R600 Localized edema: Secondary | ICD-10-CM

## 2023-04-17 DIAGNOSIS — J9601 Acute respiratory failure with hypoxia: Secondary | ICD-10-CM

## 2023-04-17 DIAGNOSIS — N170 Acute kidney failure with tubular necrosis: Secondary | ICD-10-CM

## 2023-04-17 MED ORDER — BISOPROLOL FUMARATE 5 MG PO TABS: 5.0000 mg | ORAL_TABLET | Freq: Every day | ORAL | 3 refills | Status: DC

## 2023-04-17 MED FILL — Iron Sucrose Inj 20 MG/ML (Fe Equiv): INTRAVENOUS | Qty: 10 | Status: AC

## 2023-04-17 NOTE — Patient Instructions (Signed)
Medication Instructions:  Please start bisoprolol 5 mg daily for extra beats  If you need a refill on your cardiac medications before your next appointment, please call your pharmacy.   Lab work: No new labs needed  Testing/Procedures: No new testing needed  Follow-Up: At [facility], you and your health needs are our priority.  As part of our continuing mission to provide you with exceptional heart care, we have created designated Provider Care Teams.  These Care Teams include your primary Cardiologist (physician) and Advanced Practice Providers (APPs -  Physician Assistants and Nurse Practitioners) who all work together to provide you with the care you need, when you need it.  You will need a follow up appointment in 12 months  Providers on your designated Care Team:   Nicolasa Ducking, NP Eula Listen, PA-C Cadence Fransico [place], [place]  COVID-19 Vaccine Information can be found at: [email or web address] For questions related to vaccine distribution or appointments, please email [email or web address] or call [phone].

## 2023-04-17 NOTE — Telephone Encounter (Signed)
Called and spoke with patient. Informed patient that [facility] had sent a fax stating that her insurance would not cover Bisoprolol 5 mg. Gave the patient information on [employer]. The patient stated that her insurance should cover the medication and that she is going to contact her insurance company. Instructed patient if she had any problems filling her medication to let us know. Patient verbalized understanding.

## 2023-04-17 NOTE — Progress Notes (Signed)
Daily Session Note  Patient Details  Name: DESMONIQUE HELFMAN MRN: [id number] Date of Birth: 1976/05/11 Referring Provider:   Flowsheet Row Pulmonary Rehab from 01/26/2023 in [facility] Cardiac and Pulmonary Rehab  Referring Provider Sarina Ser MD       Encounter Date: 04/17/2023  Check In:  Session Check In - 04/17/23 1106       Check-In   Supervising physician immediately available to respond to emergencies See telemetry face sheet for immediately available ER MD    Location [facility]-Cardiac & Pulmonary Rehab    Staff Present Lanny Hurst, RN, Franki Monte, BS, ACSM CEP, Exercise Physiologist;Noah Tickle, BS, Exercise Physiologist    Virtual Visit No    Medication changes reported     No    Fall or balance concerns reported    No    Warm-up and Cool-down Performed on first and last piece of equipment    Resistance Training Performed Yes    VAD Patient? No    PAD/SET Patient? No      Pain Assessment   Currently in Pain? No/denies                Social History   Tobacco Use  Smoking Status Former   Packs/day: 0.50   Years: 5.00   Additional pack years: 0.00   Total pack years: 2.50   Types: Cigarettes   Quit date: 2000   Years since quitting: 24.4  Smokeless Tobacco Never    Goals Met:  Independence with exercise equipment Exercise tolerated well No report of concerns or symptoms today Strength training completed today  Goals Unmet:  Not Applicable  Comments: Pt able to follow exercise prescription today without complaint.  Will continue to monitor for progression.    Dr. Bethann Punches is Medical Director for [facility] Cardiac Rehabilitation.  Dr. Vida Rigger is Medical Director for [facility] Pulmonary Rehabilitation.

## 2023-04-17 NOTE — Telephone Encounter (Signed)
Received a fax from [facility] that Bisoprolol 5 mg daily is not covered by insurance. Per Dr. Mariah Milling contact patient and see if she is able to afford the medication with using [employer]. Attempted to contact patient but no answer and voice mail full.

## 2023-04-18 ENCOUNTER — Inpatient Hospital Stay: Payer: 59

## 2023-04-18 VITALS — BP 120/76 | HR 88 | Temp 97.9°F

## 2023-04-18 DIAGNOSIS — D509 Iron deficiency anemia, unspecified: Secondary | ICD-10-CM | POA: Diagnosis not present

## 2023-04-18 DIAGNOSIS — E611 Iron deficiency: Secondary | ICD-10-CM

## 2023-04-18 MED ORDER — SODIUM CHLORIDE 0.9 % IV SOLN
200.0000 mg | Freq: Once | INTRAVENOUS | Status: AC
  Administered 2023-04-18: 200 mg via INTRAVENOUS
  Filled 2023-04-18: qty 200

## 2023-04-18 MED ORDER — SODIUM CHLORIDE 0.9 % IV SOLN
Freq: Once | INTRAVENOUS | Status: AC
  Filled 2023-04-18: qty 250

## 2023-04-18 NOTE — Progress Notes (Signed)
Patient declined to wait the 30 minutes for post iron infusion observation today. Tolerated infusion well. VSS. 

## 2023-04-19 ENCOUNTER — Encounter: Payer: 59 | Admitting: *Deleted

## 2023-04-19 DIAGNOSIS — J4540 Moderate persistent asthma, uncomplicated: Secondary | ICD-10-CM

## 2023-04-19 NOTE — Progress Notes (Signed)
Daily Session Note  Patient Details  Name: Kathy Vega MRN: [id number] Date of Birth: [birth year] Referring Provider:   Flowsheet Row Pulmonary Rehab from 01/26/2023 in [facility] Cardiac and Pulmonary Rehab  Referring Provider Sarina Ser MD       Encounter Date: 04/19/2023  Check In:  Session Check In - 04/19/23 1418       Check-In   Supervising physician immediately available to respond to emergencies See telemetry face sheet for immediately available ER MD    Location [facility]-Cardiac & Pulmonary Rehab    Staff Present Bess Kinds RN, Atilano Median, RN, Bobetta Lime, RCP,RRT,BSRT    Virtual Visit No    Medication changes reported     No    Fall or balance concerns reported    No    Tobacco Cessation No Change    Warm-up and Cool-down Performed on first and last piece of equipment    Resistance Training Performed Yes    VAD Patient? No    PAD/SET Patient? No      Pain Assessment   Currently in Pain? No/denies                Social History   Tobacco Use  Smoking Status Former   Packs/day: 0.50   Years: 5.00   Additional pack years: 0.00   Total pack years: 2.50   Types: Cigarettes   Quit date: 2000   Years since quitting: 24.4  Smokeless Tobacco Never    Goals Met:  Independence with exercise equipment Exercise tolerated well No report of concerns or symptoms today Strength training completed today  Goals Unmet:  Not Applicable  Comments: Pt able to follow exercise prescription today without complaint.  Will continue to monitor for progression.    Dr. Bethann Punches is Medical Director for [facility] Cardiac Rehabilitation.  Dr. Vida Rigger is Medical Director for [facility] Pulmonary Rehabilitation.

## 2023-04-25 ENCOUNTER — Inpatient Hospital Stay: Payer: 59

## 2023-04-25 VITALS — BP 106/62 | HR 82 | Temp 98.7°F | Resp 18

## 2023-04-25 DIAGNOSIS — D509 Iron deficiency anemia, unspecified: Secondary | ICD-10-CM | POA: Diagnosis not present

## 2023-04-25 DIAGNOSIS — E611 Iron deficiency: Secondary | ICD-10-CM

## 2023-04-25 MED ORDER — SODIUM CHLORIDE 0.9 % IV SOLN
200.0000 mg | Freq: Once | INTRAVENOUS | Status: AC
  Administered 2023-04-25: 200 mg via INTRAVENOUS
  Filled 2023-04-25: qty 200

## 2023-04-25 MED ORDER — SODIUM CHLORIDE 0.9 % IV SOLN
Freq: Once | INTRAVENOUS | Status: AC
  Filled 2023-04-25: qty 250

## 2023-04-25 NOTE — Progress Notes (Signed)
Pt has been educated and understands. Pt declined to stay 30 mins after iron infusion. VSS.  

## 2023-04-26 ENCOUNTER — Encounter: Payer: 59 | Admitting: *Deleted

## 2023-04-26 DIAGNOSIS — J4540 Moderate persistent asthma, uncomplicated: Secondary | ICD-10-CM

## 2023-04-26 NOTE — Progress Notes (Signed)
Daily Session Note  Patient Details  Name: Kathy Vega MRN: [id number] Date of Birth: 1976/09/17 Referring Provider:   Flowsheet Row Pulmonary Rehab from 01/26/2023 in [facility] Cardiac and Pulmonary Rehab  Referring Provider Kathy Ser MD       Encounter Date: 04/26/2023  Check In:  Session Check In - 04/26/23 1437       Check-In   Supervising physician immediately available to respond to emergencies See telemetry face sheet for immediately available ER MD    Location [facility]-Cardiac & Pulmonary Rehab    Staff Present Kathy Givens, RN BSN;Kathy Juanetta [place], MA, RCEP, CCRP, Kathy Demark, RN, [place]    Virtual Visit No    Medication changes reported     No    Fall or balance concerns reported    No    Warm-up and Cool-down Performed on first and last piece of equipment    Resistance Training Performed Yes    VAD Patient? No    PAD/SET Patient? No      Pain Assessment   Currently in Pain? No/denies                Social History   Tobacco Use  Smoking Status Former   Packs/day: 0.50   Years: 5.00   Additional pack years: 0.00   Total pack years: 2.50   Types: Cigarettes   Quit date: 2000   Years since quitting: 24.4  Smokeless Tobacco Never    Goals Met:  Independence with exercise equipment Exercise tolerated well No report of concerns or symptoms today Strength training completed today  Goals Unmet:  Not Applicable  Comments: Pt able to follow exercise prescription today without complaint.  Will continue to monitor for progression.    Dr. Bethann Vega is Medical Director for [facility] Cardiac Rehabilitation.  Dr. Vida Vega is Medical Director for [facility] Pulmonary Rehabilitation.

## 2023-04-28 ENCOUNTER — Telehealth: Payer: Self-pay

## 2023-04-28 NOTE — Telephone Encounter (Signed)
Received letter from [employer]. ONO device was delivered to >20 days ago. Patient has not returned device and [employer] has been unsuccessful with contacting patient.    Spoke to patient. She stated that she received device but she does not know where it's at. I have provided her with [employer] contact number. She will contact [employer].  Nothing further needed.

## 2023-05-02 ENCOUNTER — Inpatient Hospital Stay: Payer: 59 | Attending: Internal Medicine

## 2023-05-02 VITALS — BP 132/93 | HR 88 | Temp 97.3°F | Resp 18

## 2023-05-02 DIAGNOSIS — D509 Iron deficiency anemia, unspecified: Secondary | ICD-10-CM | POA: Insufficient documentation

## 2023-05-02 DIAGNOSIS — E611 Iron deficiency: Secondary | ICD-10-CM

## 2023-05-02 MED ORDER — SODIUM CHLORIDE 0.9 % IV SOLN
Freq: Once | INTRAVENOUS | Status: AC
  Filled 2023-05-02: qty 250

## 2023-05-02 MED ORDER — SODIUM CHLORIDE 0.9 % IV SOLN
200.0000 mg | Freq: Once | INTRAVENOUS | Status: AC
  Administered 2023-05-02: 200 mg via INTRAVENOUS
  Filled 2023-05-02: qty 200

## 2023-05-03 ENCOUNTER — Encounter: Payer: 59 | Attending: Pulmonary Disease | Admitting: *Deleted

## 2023-05-03 DIAGNOSIS — J4540 Moderate persistent asthma, uncomplicated: Secondary | ICD-10-CM | POA: Insufficient documentation

## 2023-05-03 NOTE — Progress Notes (Signed)
Daily Session Note  Patient Details  Name: Kathy Vega MRN: [id number] Date of Birth: 02-02-1976 Referring Provider:   Flowsheet Row Pulmonary Rehab from 01/26/2023 in [facility] Cardiac and Pulmonary Rehab  Referring Provider Sarina Ser MD       Encounter Date: 05/03/2023  Check In:  Session Check In - 05/03/23 1516       Check-In   Supervising physician immediately available to respond to emergencies See telemetry face sheet for immediately available ER MD    Location [facility]-Cardiac & Pulmonary Rehab    Staff Present Bess Kinds RN, BSN;Susanne Bice, RN, BSN, CCRP;Jessica [place], MA, RCEP, CCRP, CCET    Virtual Visit No    Medication changes reported     No    Fall or balance concerns reported    No    Warm-up and Cool-down Performed on first and last piece of equipment    Resistance Training Performed Yes    VAD Patient? No    PAD/SET Patient? No      Pain Assessment   Currently in Pain? No/denies                Social History   Tobacco Use  Smoking Status Former   Packs/day: 0.50   Years: 5.00   Additional pack years: 0.00   Total pack years: 2.50   Types: Cigarettes   Quit date: 2000   Years since quitting: 24.4  Smokeless Tobacco Never    Goals Met:  Independence with exercise equipment Exercise tolerated well No report of concerns or symptoms today Strength training completed today Personal goals reviewed  Goals Unmet:  Not Applicable  Comments: Pt able to follow exercise prescription today without complaint.  Will continue to monitor for progression.    Dr. Bethann Punches is Medical Director for [facility] Cardiac Rehabilitation.  Dr. Vida Rigger is Medical Director for [facility] Pulmonary Rehabilitation.

## 2023-05-08 ENCOUNTER — Encounter: Payer: 59 | Admitting: *Deleted

## 2023-05-08 DIAGNOSIS — J4540 Moderate persistent asthma, uncomplicated: Secondary | ICD-10-CM | POA: Diagnosis not present

## 2023-05-08 NOTE — Progress Notes (Signed)
Daily Session Note  Patient Details  Name: Kathy Vega MRN: [id number] Date of Birth: 1976-06-21 Referring Provider:   Flowsheet Row Pulmonary Rehab from 01/26/2023 in [facility] Cardiac and Pulmonary Rehab  Referring Provider Sarina Ser MD       Encounter Date: 05/08/2023  Check In:  Session Check In - 05/08/23 1356       Check-In   Supervising physician immediately available to respond to emergencies See telemetry face sheet for immediately available ER MD    Location [facility]-Cardiac & Pulmonary Rehab    Staff Present Susann Givens, RN BSN;Joseph Rossiter, RCP,RRT,BSRT;Laureen [place], [place], RRT, CPFT    Virtual Visit No    Medication changes reported     No    Fall or balance concerns reported    No    Warm-up and Cool-down Performed on first and last piece of equipment    Resistance Training Performed Yes    VAD Patient? No    PAD/SET Patient? No      Pain Assessment   Currently in Pain? No/denies                Social History   Tobacco Use  Smoking Status Former   Packs/day: 0.50   Years: 5.00   Additional pack years: 0.00   Total pack years: 2.50   Types: Cigarettes   Quit date: 2000   Years since quitting: 24.4  Smokeless Tobacco Never    Goals Met:  Independence with exercise equipment Exercise tolerated well No report of concerns or symptoms today Strength training completed today  Goals Unmet:  Not Applicable  Comments: Pt able to follow exercise prescription today without complaint.  Will continue to monitor for progression.    Dr. Bethann Punches is Medical Director for [facility] Cardiac Rehabilitation.  Dr. Vida Rigger is Medical Director for [facility] Pulmonary Rehabilitation.

## 2023-05-10 ENCOUNTER — Encounter: Payer: 59 | Admitting: *Deleted

## 2023-05-10 ENCOUNTER — Encounter: Payer: Self-pay | Admitting: *Deleted

## 2023-05-10 DIAGNOSIS — J4540 Moderate persistent asthma, uncomplicated: Secondary | ICD-10-CM

## 2023-05-10 NOTE — Progress Notes (Signed)
Daily Session Note  Patient Details  Name: Kathy Vega MRN: [id number] Date of Birth: 11-13-1976 Referring Provider:   Flowsheet Row Pulmonary Rehab from 01/26/2023 in [facility] Cardiac and Pulmonary Rehab  Referring Provider Sarina Ser MD       Encounter Date: 05/10/2023  Check In:  Session Check In - 05/10/23 1401       Check-In   Supervising physician immediately available to respond to emergencies See telemetry face sheet for immediately available ER MD    Location [facility]-Cardiac & Pulmonary Rehab    Staff Present Susann Givens, RN BSN;Laureen [place], BS, RRT, CPFT;Joseph [place], [place]    Virtual Visit No    Medication changes reported     No    Fall or balance concerns reported    No    Warm-up and Cool-down Performed on first and last piece of equipment    Resistance Training Performed Yes    VAD Patient? No    PAD/SET Patient? No      Pain Assessment   Currently in Pain? No/denies                Social History   Tobacco Use  Smoking Status Former   Packs/day: 0.50   Years: 5.00   Additional pack years: 0.00   Total pack years: 2.50   Types: Cigarettes   Quit date: 2000   Years since quitting: 24.4  Smokeless Tobacco Never    Goals Met:  Independence with exercise equipment Exercise tolerated well No report of concerns or symptoms today Strength training completed today  Goals Unmet:  Not Applicable  Comments: Pt able to follow exercise prescription today without complaint.  Will continue to monitor for progression.    Dr. Bethann Punches is Medical Director for [facility] Cardiac Rehabilitation.  Dr. Vida Rigger is Medical Director for [facility] Pulmonary Rehabilitation.

## 2023-05-10 NOTE — Progress Notes (Signed)
Pulmonary Individual Treatment Plan  Patient Details  Name: Kathy Vega MRN: [id number] Date of Birth: [birth year] Referring Provider:   Flowsheet Row Pulmonary Rehab from 01/26/2023 in [facility] Cardiac and Pulmonary Rehab  Referring Provider Sarina Ser MD       Initial Encounter Date:  Flowsheet Row Pulmonary Rehab from 01/26/2023 in [facility] Cardiac and Pulmonary Rehab  Date 01/26/23       Visit Diagnosis: Moderate persistent asthma in adult without complication  Patient's Home Medications on Admission:  Current Outpatient Medications:    acetaminophen (TYLENOL) 500 MG tablet, Take 500 mg by mouth every 6 (six) hours as needed for headache, fever, moderate pain or mild pain., Disp: , Rfl:    albuterol (PROVENTIL) (2.5 MG/3ML) 0.083% nebulizer solution, Take 3 mLs (2.5 mg total) by nebulization every 6 (six) hours as needed for wheezing or shortness of breath., Disp: 360 mL, Rfl: 11   ascorbic acid (VITAMIN C) 500 MG tablet, Take 1 tablet (500 mg total) by mouth daily., Disp: 30 tablet, Rfl: 0   bisoprolol (ZEBETA) 5 MG tablet, Take 1 tablet (5 mg total) by mouth daily., Disp: 90 tablet, Rfl: 3   budesonide (PULMICORT) 0.5 MG/2ML nebulizer solution, Take 2 mLs (0.5 mg total) by nebulization 2 (two) times daily., Disp: 120 mL, Rfl: 5   formoterol (PERFOROMIST) 20 MCG/2ML nebulizer solution, Take 2 mLs (20 mcg total) by nebulization 2 (two) times daily. Use before the budesonide, twice a day., Disp: 120 mL, Rfl: 11   furosemide (LASIX) 20 MG tablet, Take 1 tablet (20 mg total) by mouth daily as needed for edema (shortness of breath)., Disp: 90 tablet, Rfl: 3   HYDROcodone-acetaminophen (NORCO/VICODIN) 5-325 MG tablet, Take 1 tablet by mouth every 4 (four) hours as needed for moderate pain., Disp: 12 tablet, Rfl: 0   hydrOXYzine (ATARAX) 10 MG tablet, Take 10 mg by mouth 3 (three) times daily., Disp: , Rfl:    iron polysaccharides (NIFEREX) 150 MG capsule, Take 1 capsule (150 mg total) by  mouth daily., Disp: 30 capsule, Rfl: 1   levothyroxine (SYNTHROID) 125 MCG tablet, Take 250 mcg by mouth daily., Disp: , Rfl:    meloxicam (MOBIC) 15 MG tablet, Take 1 tablet (15 mg total) by mouth daily., Disp: 30 tablet, Rfl: 0   methocarbamol (ROBAXIN) 500 MG tablet, Take 1 tablet (500 mg total) by mouth 4 (four) times daily., Disp: 30 tablet, Rfl: 0   norethindrone (AYGESTIN) 5 MG tablet, Take 10 mg by mouth daily., Disp: , Rfl:    nystatin cream (MYCOSTATIN), Apply topically 2 (two) times daily., Disp: 30 g, Rfl: 0   OXYGEN, Inhale into the lungs at bedtime as needed. Up to 4 liters, Disp: , Rfl:    pantoprazole (PROTONIX) 40 MG tablet, Take 1 tablet (40 mg total) by mouth daily., Disp: 30 tablet, Rfl: 2   propranolol (INDERAL) 10 MG tablet, Take 1 tablet (10 mg total) by mouth 3 (three) times daily as needed (palpitations)., Disp: 90 tablet, Rfl: 6  Past Medical History: Past Medical History:  Diagnosis Date   Acute encephalopathy 08/13/2020   Acute renal failure (ARF) (HCC) 08/13/2020   Anemia    Asthma    Cervical cancer (HCC)    Hypothyroidism    Iron deficiency anemia    Obesity (BMI 30-39.9)    Pneumonia due to COVID-19 virus 08/13/2020   requiring trach and prolonged hospitalization   Subglottic stenosis    Tracheal stenosis    Tracheostomy tube present (HCC)  Tobacco Use: Social History   Tobacco Use  Smoking Status Former   Packs/day: 0.50   Years: 5.00   Additional pack years: 0.00   Total pack years: 2.50   Types: Cigarettes   Quit date: 2000   Years since quitting: 24.4  Smokeless Tobacco Never    Labs: Review Flowsheet  More data exists      Latest Ref Rng & Units 08/16/2020 08/17/2020 08/18/2020 08/20/2020 08/31/2020  Labs for ITP Cardiac and Pulmonary Rehab  Trlycerides <150 mg/dL - 1,610  - - -  PH, Arterial 7.350 - 7.450 7.40  - 7.41  7.38  7.42  7.47   PCO2 arterial 32.0 - 48.0 mmHg 44  - 49  56  48  53   Bicarbonate 20.0 - 28.0 mmol/L 27.3  -  31.1  33.1  31.1  38.6   O2 Saturation % 98.8  - 92.3  94.6  93.1  92.5      Pulmonary Assessment Scores:  Pulmonary Assessment Scores     Row Name 01/26/23 1601         ADL UCSD   ADL Phase Entry     SOB Score total 100     Rest 3     Walk 4     Stairs 5     Bath 3     Dress 5     Shop 5       CAT Score   CAT Score 25       mMRC Score   mMRC Score 4              UCSD: Self-administered rating of dyspnea associated with activities of daily living (ADLs) 6-point scale (0 = "not at all" to 5 = "maximal or unable to do because of breathlessness")  Scoring Scores range from 0 to 120.  Minimally important difference is 5 units  CAT: CAT can identify the health impairment of COPD patients and is better correlated with disease progression.  CAT has a scoring range of zero to 40. The CAT score is classified into four groups of low (less than 10), medium (10 - 20), high (21-30) and very high (31-40) based on the impact level of disease on health status. A CAT score over 10 suggests significant symptoms.  A worsening CAT score could be explained by an exacerbation, poor medication adherence, poor inhaler technique, or progression of COPD or comorbid conditions.  CAT MCID is 2 points  mMRC: mMRC (Modified Medical Research Council) Dyspnea Scale is used to assess the degree of baseline functional disability in patients of respiratory disease due to dyspnea. No minimal important difference is established. A decrease in score of 1 point or greater is considered a positive change.   Pulmonary Function Assessment:  Pulmonary Function Assessment - 01/16/23 1215       Breath   Shortness of Breath Yes;Limiting activity             Exercise Target Goals: Exercise Program Goal: Individual exercise prescription set using results from initial 6 min walk test and THRR while considering  patient's activity barriers and safety.   Exercise Prescription Goal: Initial exercise  prescription builds to 30-45 minutes a day of aerobic activity, 2-3 days per week.  Home exercise guidelines will be given to patient during program as part of exercise prescription that the participant will acknowledge.  Education: Aerobic Exercise: - Group verbal and visual presentation on the components of exercise prescription. Introduces F.I.T.T principle from ACSM  for exercise prescriptions.  Reviews F.I.T.T. principles of aerobic exercise including progression. Written material given at graduation.   Education: Resistance Exercise: - Group verbal and visual presentation on the components of exercise prescription. Introduces F.I.T.T principle from ACSM for exercise prescriptions  Reviews F.I.T.T. principles of resistance exercise including progression. Written material given at graduation.    Education: Exercise & Equipment Safety: - Individual verbal instruction and demonstration of equipment use and safety with use of the equipment. Flowsheet Row Pulmonary Rehab from 04/12/2023 in [facility] Cardiac and Pulmonary Rehab  Date 01/16/23  Educator [facility]  Instruction Review Code 1- Verbalizes Understanding       Education: Exercise Physiology & General Exercise Guidelines: - Group verbal and written instruction with models to review the exercise physiology of the cardiovascular system and associated critical values. Provides general exercise guidelines with specific guidelines to those with heart or lung disease.    Education: Flexibility, Balance, Mind/Body Relaxation: - Group verbal and visual presentation with interactive activity on the components of exercise prescription. Introduces F.I.T.T principle from ACSM for exercise prescriptions. Reviews F.I.T.T. principles of flexibility and balance exercise training including progression. Also discusses the mind body connection.  Reviews various relaxation techniques to help reduce and manage stress (i.e. Deep breathing, progressive muscle  relaxation, and visualization). Balance handout provided to take home. Written material given at graduation.   Activity Barriers & Risk Stratification:  Activity Barriers & Cardiac Risk Stratification - 01/26/23 1617       Activity Barriers & Cardiac Risk Stratification   Activity Barriers Back Problems;Deconditioning;Muscular Weakness;Shortness of Breath;Other (comment)    Comments chronic back pain, bilateral knee pain, tracheostomy in place             6 Minute Walk:  6 Minute Walk     Row Name 01/26/23 1626         6 Minute Walk   Phase Initial     Distance 395 feet     Walk Time 5.1 minutes     # of Rest Breaks 5  0.52-1:01; 1:32-1:43; 2:28-2:41; 3:37-3:43; 4:44-4:57     MPH 0.88     METS 1.94     RPE 13     Perceived Dyspnea  3     VO2 Peak 6.8     Symptoms Yes (comment)     Comments Back pain 7/10, SOB, fatigue     Resting HR 90 bpm     Resting BP 118/74     Resting Oxygen Saturation  99 %     Exercise Oxygen Saturation  during 6 min walk 94 %     Max Ex. HR 114 bpm     Max Ex. BP 132/80     2 Minute Post BP 120/76       Interval HR   1 Minute HR 101     2 Minute HR 110     3 Minute HR 112     4 Minute HR 108     5 Minute HR 114     6 Minute HR 111     2 Minute Post HR 87     Interval Heart Rate? Yes       Interval Oxygen   Interval Oxygen? Yes     Baseline Oxygen Saturation % 99 %     1 Minute Oxygen Saturation % 94 %     1 Minute Liters of Oxygen 4 L  trach collar; 28% Fio2     2 Minute Oxygen Saturation %  98 %     2 Minute Liters of Oxygen 4 L     3 Minute Oxygen Saturation % 99 %     3 Minute Liters of Oxygen 4 L     4 Minute Oxygen Saturation % 99 %     4 Minute Liters of Oxygen 4 L     5 Minute Oxygen Saturation % 97 %     5 Minute Liters of Oxygen 4 L     6 Minute Oxygen Saturation % 96 %     6 Minute Liters of Oxygen 4 L     2 Minute Post Oxygen Saturation % 98 %     2 Minute Post Liters of Oxygen 4 L             Oxygen  Initial Assessment:  Oxygen Initial Assessment - 01/26/23 1557       Home Oxygen   Home Oxygen Device E-Tanks;Home Concentrator    Sleep Oxygen Prescription Continuous   Trach Collar   Liters per minute 5    Home Exercise Oxygen Prescription Continuous   28% Fio2- trach collar   Liters per minute 4   28% Fio2 - trach collar   Home Resting Oxygen Prescription Continuous   28% Fio2- trach collar   Liters per minute 4    Compliance with Home Oxygen Use Yes      Initial 6 min Walk   Oxygen Used Continuous    Liters per minute 4    FiO2% 28   trach collar     Program Oxygen Prescription   Program Oxygen Prescription Continuous    Liters per minute 4    FiO2% 28   trach collar   Comments RT met with patient and reviewed information with patient.      Intervention   Short Term Goals To learn and exhibit compliance with exercise, home and travel O2 prescription;To learn and understand importance of monitoring SPO2 with pulse oximeter and demonstrate accurate use of the pulse oximeter.;To learn and demonstrate proper pursed lip breathing techniques or other breathing techniques. ;To learn and understand importance of maintaining oxygen saturations>88%;To learn and demonstrate proper use of respiratory medications    Long  Term Goals Exhibits compliance with exercise, home  and travel O2 prescription;Maintenance of O2 saturations>88%;Compliance with respiratory medication;Demonstrates proper use of MDI's;Exhibits proper breathing techniques, such as pursed lip breathing or other method taught during program session;Verbalizes importance of monitoring SPO2 with pulse oximeter and return demonstration             Oxygen Re-Evaluation:  Oxygen Re-Evaluation     Row Name 03/13/23 1400 04/10/23 1403 05/03/23 1435         Program Oxygen Prescription   Program Oxygen Prescription -- Continuous None     Liters per minute -- 4 --     Comments -- patient wanted to try room air for exercise  today. now staying on room air and saturation are staying above 90%       Home Oxygen   Home Oxygen Device -- E-Tanks;Home Concentrator None;Home Concentrator  not needing currently     Sleep Oxygen Prescription -- Continuous None     Liters per minute -- 5 --     Home Exercise Oxygen Prescription -- -- None     Liters per minute -- 4 --     Home Resting Oxygen Prescription -- Continuous None     Liters per minute -- 4 --  Compliance with Home Oxygen Use -- Yes Yes       Goals/Expected Outcomes   Short Term Goals To learn and exhibit compliance with exercise, home and travel O2 prescription;To learn and understand importance of monitoring SPO2 with pulse oximeter and demonstrate accurate use of the pulse oximeter.;To learn and demonstrate proper pursed lip breathing techniques or other breathing techniques. ;To learn and understand importance of maintaining oxygen saturations>88%;To learn and demonstrate proper use of respiratory medications To learn and demonstrate proper pursed lip breathing techniques or other breathing techniques.  To learn and exhibit compliance with exercise, home and travel O2 prescription;To learn and understand importance of monitoring SPO2 with pulse oximeter and demonstrate accurate use of the pulse oximeter.;To learn and understand importance of maintaining oxygen saturations>88%;To learn and demonstrate proper pursed lip breathing techniques or other breathing techniques.      Long  Term Goals Exhibits compliance with exercise, home  and travel O2 prescription;Maintenance of O2 saturations>88%;Compliance with respiratory medication;Demonstrates proper use of MDI's;Exhibits proper breathing techniques, such as pursed lip breathing or other method taught during program session;Verbalizes importance of monitoring SPO2 with pulse oximeter and return demonstration Exhibits proper breathing techniques, such as pursed lip breathing or other method taught during program session  Exhibits compliance with exercise, home  and travel O2 prescription;Verbalizes importance of monitoring SPO2 with pulse oximeter and return demonstration;Maintenance of O2 saturations>88%;Compliance with respiratory medication;Exhibits proper breathing techniques, such as pursed lip breathing or other method taught during program session     Comments Reviewed PLB technique with pt.  Talked about how it works and it's importance in maintaining their exercise saturations. Diaphragmatic and PLB breathing explained and performed with patient. Patient has a better understanding of how to do these exercises to help with breathing performance and relaxation. Patient performed breathing techniques adequately and to practice further at home. Patient would like to titrate her oxygen today and see how her oxygen saturations are while exercising. Readie is doing well in rehab.  She is no longer requiring oxygen therapy to maintain her saturations.  She continues to check them and uses her nebulizer routinely at home.  She is good about using her PLB as well.  She wants to ask about getting her trach out and plans to ask her doctor about it at end of month.  She is worried about closing off if it comes out, but really doesn't want it the rest of her life.     Goals/Expected Outcomes Short: Become more profiecient at using PLB.   Long: Become independent at using PLB. Short: practice PLB and diaphragmatic breathing at home. Long: Use PLB, titrate oxygen and diaphragmatic breathing independently post [facility]. Short: Continue to monitor saturations Long: Talk to doctor about trach removal.              Oxygen Discharge (Final Oxygen Re-Evaluation):  Oxygen Re-Evaluation - 05/03/23 1435       Program Oxygen Prescription   Program Oxygen Prescription None    Comments now staying on room air and saturation are staying above 90%      Home Oxygen   Home Oxygen Device None;Home Concentrator   not needing currently    Sleep Oxygen Prescription None    Home Exercise Oxygen Prescription None    Home Resting Oxygen Prescription None    Compliance with Home Oxygen Use Yes      Goals/Expected Outcomes   Short Term Goals To learn and exhibit compliance with exercise, home and travel O2 prescription;To  learn and understand importance of monitoring SPO2 with pulse oximeter and demonstrate accurate use of the pulse oximeter.;To learn and understand importance of maintaining oxygen saturations>88%;To learn and demonstrate proper pursed lip breathing techniques or other breathing techniques.     Long  Term Goals Exhibits compliance with exercise, home  and travel O2 prescription;Verbalizes importance of monitoring SPO2 with pulse oximeter and return demonstration;Maintenance of O2 saturations>88%;Compliance with respiratory medication;Exhibits proper breathing techniques, such as pursed lip breathing or other method taught during program session    Comments Nedda is doing well in rehab.  She is no longer requiring oxygen therapy to maintain her saturations.  She continues to check them and uses her nebulizer routinely at home.  She is good about using her PLB as well.  She wants to ask about getting her trach out and plans to ask her doctor about it at end of month.  She is worried about closing off if it comes out, but really doesn't want it the rest of her life.    Goals/Expected Outcomes Short: Continue to monitor saturations Long: Talk to doctor about trach removal.             Initial Exercise Prescription:  Initial Exercise Prescription - 01/26/23 1600       Date of Initial Exercise RX and Referring Provider   Date 01/26/23    Referring Provider Sarina Ser MD      Oxygen   Oxygen Continuous    Liters 4   Trach collar; 28% Fio2   Maintain Oxygen Saturation 88% or higher      Recumbant Bike   Level 1    RPM 50    Watts 15    Minutes 15    METs 1.9      T5 Nustep   Level 1    SPM 80     Minutes 15    METs 1.9      Biostep-RELP   Level 1    SPM 50    Minutes 15    METs 1.9      Track   Laps 4   Breaks as needed   Minutes 15    METs 1.1      Prescription Details   Frequency (times per week) 2    Duration Progress to 30 minutes of continuous aerobic without signs/symptoms of physical distress      Intensity   THRR 40-80% of Max Heartrate 123- 156    Ratings of Perceived Exertion 11-13    Perceived Dyspnea 0-4      Progression   Progression Continue to progress workloads to maintain intensity without signs/symptoms of physical distress.      Resistance Training   Training Prescription Yes    Weight 3 lb   no overhead exercises; relayed to patient   Reps 10-15             Perform Capillary Blood Glucose checks as needed.  Exercise Prescription Changes:   Exercise Prescription Changes     Row Name 01/26/23 1600 03/16/23 1400 03/29/23 1300 04/12/23 1300 04/27/23 1500     Response to Exercise   Blood Pressure (Admit) 118/74 132/80 136/82 148/80 110/72   Blood Pressure (Exercise) 132/80 158/70 138/70 128/70 144/76   Blood Pressure (Exit) 120/76 130/80 140/70 112/72 104/62   Heart Rate (Admit) 90 bpm 114 bpm 102 bpm 90 bpm 95 bpm   Heart Rate (Exercise) 114 bpm 110 bpm 134 bpm 128 bpm 128 bpm   Heart  Rate (Exit) 87 bpm 106 bpm 109 bpm 105 bpm 105 bpm   Oxygen Saturation (Admit) 99 % 92 % 98 % 92 % 94 %   Oxygen Saturation (Exercise) 94 % 96 % 95 % 88 % 89 %   Oxygen Saturation (Exit) 98 % 97 % 97 % 94 % 96 %   Rating of Perceived Exertion (Exercise) 13 11 13 13 16    Perceived Dyspnea (Exercise) 3 1 3 1 3    Symptoms SOB, fatigue, back pain 7/10 SOB SOB SOB SOB   Comments walk test results First full day of exercise -- -- --   Duration -- Progress to 30 minutes of  aerobic without signs/symptoms of physical distress Progress to 30 minutes of  aerobic without signs/symptoms of physical distress Progress to 30 minutes of  aerobic without signs/symptoms  of physical distress Continue with 30 min of aerobic exercise without signs/symptoms of physical distress.   Intensity -- THRR unchanged THRR unchanged THRR unchanged THRR unchanged     Progression   Progression -- Continue to progress workloads to maintain intensity without signs/symptoms of physical distress. Continue to progress workloads to maintain intensity without signs/symptoms of physical distress. Continue to progress workloads to maintain intensity without signs/symptoms of physical distress. Continue to progress workloads to maintain intensity without signs/symptoms of physical distress.   Average METs -- 1.8 1.53 1.93 2.03     Resistance Training   Training Prescription -- Yes Yes Yes Yes   Weight -- 3 lb  no overhead exercises; relayed to patient 3 lb 3 lb 3 lb   Reps -- 10-15 10-15 10-15 10-15     Interval Training   Interval Training -- -- -- No No     Oxygen   Oxygen -- Continuous Continuous -- --   Liters -- 3 3  Trach collar RA RA     NuStep   Level -- -- -- -- 1   Minutes -- -- -- -- 15   METs -- -- -- -- 2.3     Arm Ergometer   Level -- -- -- -- 1   Minutes -- -- -- -- 15   METs -- -- -- -- 2.1     REL-XR   Level -- -- -- -- 4   Minutes -- -- -- -- 15   METs -- -- -- -- 1     T5 Nustep   Level -- 1 -- 2 --   Minutes -- 30 -- 15 --   METs -- 1.8 -- 2.2 --     Biostep-RELP   Level -- -- 1 -- 1   Minutes -- -- 15 -- 15   METs -- -- 2 -- 3     Track   Laps -- -- 10 12 18    Minutes -- -- 15 15 15    METs -- -- 1.54 1.65 1.98     Oxygen   Maintain Oxygen Saturation -- 88% or higher 88% or higher 88% or higher 88% or higher    Row Name 05/10/23 0900             Response to Exercise   Blood Pressure (Admit) 134/62       Blood Pressure (Exercise) 148/80       Blood Pressure (Exit) 122/62       Heart Rate (Admit) 95 bpm       Heart Rate (Exercise) 124 bpm       Heart Rate (Exit) 102 bpm  Oxygen Saturation (Admit) 93 %       Oxygen  Saturation (Exercise) 92 %       Oxygen Saturation (Exit) 94 %       Rating of Perceived Exertion (Exercise) 13       Perceived Dyspnea (Exercise) 2       Symptoms SOB       Duration Continue with 30 min of aerobic exercise without signs/symptoms of physical distress.       Intensity THRR unchanged         Progression   Progression Continue to progress workloads to maintain intensity without signs/symptoms of physical distress.       Average METs 3.26         Resistance Training   Training Prescription Yes       Weight 3 lb       Reps 10-15         Interval Training   Interval Training No         Oxygen   Liters RA         NuStep   Level 5       Minutes 30       METs 4.6         REL-XR   Level 5       Minutes 15       METs 3.6         Track   Laps 8       Minutes 15       METs 1.44         Oxygen   Maintain Oxygen Saturation 88% or higher                Exercise Comments:   Exercise Comments     Row Name 03/13/23 1400           Exercise Comments First full day of exercise!  Patient was oriented to gym and equipment including functions, settings, policies, and procedures.  Patient's individual exercise prescription and treatment plan were reviewed.  All starting workloads were established based on the results of the 6 minute walk test done at initial orientation visit.  The plan for exercise progression was also introduced and progression will be customized based on patient's performance and goals.                Exercise Goals and Review:   Exercise Goals     Row Name 01/26/23 1645             Exercise Goals   Increase Physical Activity Yes       Intervention Develop an individualized exercise prescription for aerobic and resistive training based on initial evaluation findings, risk stratification, comorbidities and participant's personal goals.;Provide advice, education, support and counseling about physical activity/exercise needs.        Expected Outcomes Short Term: Attend rehab on a regular basis to increase amount of physical activity.;Long Term: Add in home exercise to make exercise part of routine and to increase amount of physical activity.;Long Term: Exercising regularly at least 3-5 days a week.       Increase Strength and Stamina Yes       Intervention Provide advice, education, support and counseling about physical activity/exercise needs.       Expected Outcomes Short Term: Perform resistance training exercises routinely during rehab and add in resistance training at home;Long Term: Improve cardiorespiratory fitness, muscular endurance and strength as measured by increased METs and functional  capacity ( );Short Term: Increase workloads from initial exercise prescription for resistance, speed, and METs.       Able to understand and use rate of perceived exertion (RPE) scale Yes       Intervention Provide education and explanation on how to use RPE scale       Expected Outcomes Short Term: Able to use RPE daily in rehab to express subjective intensity level;Long Term:  Able to use RPE to guide intensity level when exercising independently       Able to understand and use Dyspnea scale Yes       Intervention Provide education and explanation on how to use Dyspnea scale       Expected Outcomes Long Term: Able to use Dyspnea scale to guide intensity level when exercising independently;Short Term: Able to use Dyspnea scale daily in rehab to express subjective sense of shortness of breath during exertion       Knowledge and understanding of Target Heart Rate Range (THRR) Yes       Intervention Provide education and explanation of THRR including how the numbers were predicted and where they are located for reference       Expected Outcomes Short Term: Able to state/look up THRR;Long Term: Able to use THRR to govern intensity when exercising independently;Short Term: Able to use daily as guideline for intensity in rehab       Able  to check pulse independently Yes       Intervention Provide education and demonstration on how to check pulse in carotid and radial arteries.;Review the importance of being able to check your own pulse for safety during independent exercise       Expected Outcomes Long Term: Able to check pulse independently and accurately;Short Term: Able to explain why pulse checking is important during independent exercise       Understanding of Exercise Prescription Yes       Intervention Provide education, explanation, and written materials on patient's individual exercise prescription       Expected Outcomes Short Term: Able to explain program exercise prescription;Long Term: Able to explain home exercise prescription to exercise independently                Exercise Goals Re-Evaluation :  Exercise Goals Re-Evaluation     Row Name 03/13/23 1400 03/16/23 1451 03/29/23 1341 04/12/23 1329 04/27/23 1541     Exercise Goal Re-Evaluation   Exercise Goals Review Increase Physical Activity;Able to understand and use rate of perceived exertion (RPE) scale;Knowledge and understanding of Target Heart Rate Range (THRR);Understanding of Exercise Prescription;Increase Strength and Stamina;Able to check pulse independently;Able to understand and use Dyspnea scale Increase Physical Activity;Increase Strength and Stamina;Understanding of Exercise Prescription Increase Physical Activity;Increase Strength and Stamina;Understanding of Exercise Prescription Increase Physical Activity;Increase Strength and Stamina;Understanding of Exercise Prescription Increase Physical Activity;Increase Strength and Stamina;Understanding of Exercise Prescription   Comments Reviewed RPE scale, THR and program prescription with pt today.  Pt voiced understanding and was given a copy of goals to take home. Shelicia is off to a good start in rehab. During her first session she was able to work at level 1 on the T5 nustep. She also did well with 3 lb  hand weights for resistance training. We will continue to monitor her progress in the program. Rodney Booze completed 2 more sessions since last review, she was not able to attend the other sessions as she had to call out. She tried out the Biostep at level 1 and was  able to walk 10 laps on the track! Patient has a trach to work with during exercise and has back up supplies with her here. Her oxygen saturations are staying nicely above 88%. We will continue to monitor, Roxy has only completed one session since the last review and has been inconsistent with her attendance. She did perform her exercise without oxygen during his last session and did well on room air. She was able to keep her O2 saturation above 88% as well. She also improved to level 2 on the T5 nustep and walked up to 12 laps on the track. We will continue to monitor her progress in the program. Addisynn is doing well in rehab. She recently increased her overall average MET level to 2.03 METs. She also has continued to do well with exercise without oxygen, and her O2 saturation has only dropped below 90% once since the last review. She walked up to 18 laps as well, and improved to level 4 on the XR. We will continue to monitor her progress in the program.   Expected Outcomes Short: Use RPE daily to regulate intensity.  Long: Follow program prescription in THR. Short: Continue to attend rehab consistently. Long: Continue to follow current exercise prescription. Short: Continue working in Engineer, manufacturing systems on track to increase laps Long: Continue to increae overall MET level and stamina Short: Attend rehab more consistently, progressively increase workloads. Long: Continue to improve strength and stamina. Short: Continue to progressively increase workloads. Long: Continue to increase overall MET level and stamina.    Row Name 05/03/23 1427 05/10/23 0931           Exercise Goal Re-Evaluation   Exercise Goals Review Increase Physical Activity;Increase  Strength and Stamina;Understanding of Exercise Prescription Increase Physical Activity;Increase Strength and Stamina;Understanding of Exercise Prescription      Comments Vasilisa is doing well in rehab. She is exercising some on her off days from rehab by walking.  She is doing well with it; her breathing is her biggest limitation, but it is doing well.  She has noticed that her stamina is getting better with being in rehab. Ciclali continues to do well in rehab. She was able to improve to level 5 on both the T4 nustep and the XR. She also has continued to do well walking on the track without oxygen, as her O2 saturations have not dropped below 92%. She also increased her overall average MET level to 3.26 METs. We will continue to monitor her progress in the program.      Expected Outcomes Short: Continue to walk on off days Long: Continue to improve stamina Short: Continue to push for more laps on the track. Long: Continue to improve strength and stamina.               Discharge Exercise Prescription (Final Exercise Prescription Changes):  Exercise Prescription Changes - 05/10/23 0900       Response to Exercise   Blood Pressure (Admit) 134/62    Blood Pressure (Exercise) 148/80    Blood Pressure (Exit) 122/62    Heart Rate (Admit) 95 bpm    Heart Rate (Exercise) 124 bpm    Heart Rate (Exit) 102 bpm    Oxygen Saturation (Admit) 93 %    Oxygen Saturation (Exercise) 92 %    Oxygen Saturation (Exit) 94 %    Rating of Perceived Exertion (Exercise) 13    Perceived Dyspnea (Exercise) 2    Symptoms SOB    Duration Continue with 30 min  of aerobic exercise without signs/symptoms of physical distress.    Intensity THRR unchanged      Progression   Progression Continue to progress workloads to maintain intensity without signs/symptoms of physical distress.    Average METs 3.26      Resistance Training   Training Prescription Yes    Weight 3 lb    Reps 10-15      Interval Training   Interval  Training No      Oxygen   Liters RA      NuStep   Level 5    Minutes 30    METs 4.6      REL-XR   Level 5    Minutes 15    METs 3.6      Track   Laps 8    Minutes 15    METs 1.44      Oxygen   Maintain Oxygen Saturation 88% or higher             Nutrition:  Target Goals: Understanding of nutrition guidelines, daily intake of sodium 1500mg , cholesterol 200mg , calories 30% from fat and 7% or less from saturated fats, daily to have 5 or more servings of fruits and vegetables.  Education: All About Nutrition: -Group instruction provided by verbal, written material, interactive activities, discussions, models, and posters to present general guidelines for heart healthy nutrition including fat, fiber, MyPlate, the role of sodium in heart healthy nutrition, utilization of the nutrition label, and utilization of this knowledge for meal planning. Follow up email sent as well. Written material given at graduation.   Biometrics:  Pre Biometrics - 01/26/23 1624       Pre Biometrics   Height 5\' 9"  (1.753 m)    Weight 275 lb 4.8 oz (124.9 kg)    Waist Circumference 51 inches    Hip Circumference 52.5 inches    Waist to Hip Ratio 0.97 %    BMI (Calculated) 40.64    Single Leg Stand 14.2 seconds              Nutrition Therapy Plan and Nutrition Goals:  Nutrition Therapy & Goals - 05/03/23 1432       Nutrition Therapy   RD appointment deferred Yes             Nutrition Assessments:  MEDIFICTS Score Key: ?70 Need to make dietary changes  40-70 Heart Healthy Diet ? 40 Therapeutic Level Cholesterol Diet  Flowsheet Row Pulmonary Rehab from 01/26/2023 in [facility]  Picture Your Plate Total Score on Admission 47      Picture Your Plate Scores: <40 Unhealthy dietary pattern with much room for improvement. 41-50 Dietary pattern unlikely to meet recommendations for good health and room for improvement. 51-60 More healthful dietary  pattern, with some room for improvement.  >60 Healthy dietary pattern, although there may be some specific behaviors that could be improved.   Nutrition Goals Re-Evaluation:  Nutrition Goals Re-Evaluation     Row Name 04/10/23 1406 05/03/23 1431           Goals   Current Weight 274 lb (124.3 kg) --      Nutrition Goal eat smaller portions. work on snacking      Comment Patient was informed on why it is important to maintain a balanced diet when dealing with Respiratory issues. Explained that it takes a lot of energy to breath and when they are short of breath often they will need to have a  good diet to help keep up with the calories they are expending for breathing. Rodney Booze declined dietitian appt. Her weight is going down.  She is eating more fruit and cut back on portion sizes.  She is trying to do better with eating smaller meals.      Expected Outcome Short: Choose and plan snacks accordingly to patients caloric intake to improve breathing. Long: Maintain a diet independently that meets their caloric intake to aid in daily shortness of breath. Short; Continue to add in more fruits Long: Continue to improve diet               Nutrition Goals Discharge (Final Nutrition Goals Re-Evaluation):  Nutrition Goals Re-Evaluation - 05/03/23 1431       Goals   Nutrition Goal work on snacking    Comment Arcelia declined dietitian appt. Her weight is going down.  She is eating more fruit and cut back on portion sizes.  She is trying to do better with eating smaller meals.    Expected Outcome Short; Continue to add in more fruits Long: Continue to improve diet             Psychosocial: Target Goals: Acknowledge presence or absence of significant depression and/or stress, maximize coping skills, provide positive support system. Participant is able to verbalize types and ability to use techniques and skills needed for reducing stress and depression.   Education: Stress, Anxiety, and  Depression - Group verbal and visual presentation to define topics covered.  Reviews how body is impacted by stress, anxiety, and depression.  Also discusses healthy ways to reduce stress and to treat/manage anxiety and depression.  Written material given at graduation.   Education: Sleep Hygiene -Provides group verbal and written instruction about how sleep can affect your health.  Define sleep hygiene, discuss sleep cycles and impact of sleep habits. Review good sleep hygiene tips.    Initial Review & Psychosocial Screening:  Initial Psych Review & Screening - 01/16/23 1217       Initial Review   Current issues with Current Stress Concerns    Source of Stress Concerns Chronic Illness    Comments Patient states that she has her kids and husband for support. Other parts of her family can be caled for support but not often. She wants to get her health back on track and breath better.      Family Dynamics   Good Support System? Yes    Comments Short: Start [facility] to help with mood. Long: Maintain a healthy mental state.      Screening Interventions   Interventions Encouraged to exercise;To provide support and resources with identified psychosocial needs;Provide feedback about the scores to participant    Expected Outcomes Short Term goal: Utilizing psychosocial counselor, staff and physician to assist with identification of specific Stressors or current issues interfering with healing process. Setting desired goal for each stressor or current issue identified.;Long Term Goal: Stressors or current issues are controlled or eliminated.;Short Term goal: Identification and review with participant of any Quality of Life or Depression concerns found by scoring the questionnaire.;Long Term goal: The participant improves quality of Life and PHQ9 Scores as seen by post scores and/or verbalization of changes             Quality of Life Scores:  Scores of 19 and below usually indicate a poorer  quality of life in these areas.  A difference of  2-3 points is a clinically meaningful difference.  A difference of  2-3 points in the total score of the Quality of Life Index has been associated with significant improvement in overall quality of life, self-image, physical symptoms, and general health in studies assessing change in quality of life.  PHQ-9: Review Flowsheet       04/10/2023 01/26/2023  Depression screen PHQ 2/9  Decreased Interest 2 3  Down, Depressed, Hopeless 0 1  PHQ - 2 Score 2 4  Altered sleeping 0 0  Tired, decreased energy 3 3  Change in appetite 0 0  Feeling bad or failure about yourself  0 0  Trouble concentrating 0 0  Moving slowly or fidgety/restless 0 0  Suicidal thoughts 0 0  PHQ-9 Score 5 7  Difficult doing work/chores Somewhat difficult Somewhat difficult   Interpretation of Total Score  Total Score Depression Severity:  1-4 = Minimal depression, 5-9 = Mild depression, 10-14 = Moderate depression, 15-19 = Moderately severe depression, 20-27 = Severe depression   Psychosocial Evaluation and Intervention:  Psychosocial Evaluation - 01/16/23 1220       Psychosocial Evaluation & Interventions   Interventions Stress management education;Relaxation education;Encouraged to exercise with the program and follow exercise prescription    Comments Patient states that she has her kids and husband for support. Other parts of her family can be caled for support but not often. She wants to get her health back on track and breath better.    Expected Outcomes Short: Start [facility] to help with mood. Long: Maintain a healthy mental state.    Continue Psychosocial Services  Follow up required by staff             Psychosocial Re-Evaluation:  Psychosocial Re-Evaluation     Row Name 04/10/23 1411 05/03/23 1429           Psychosocial Re-Evaluation   Current issues with History of Depression History of Depression      Comments Reviewed patient health  questionnaire (PHQ-9) with patient for follow up. Previously, patients score indicated signs/symptoms of depression.  Reviewed to see if patient is improving symptom wise while in program.  Score improved and patient states that it is because she has been able to control herhealth more. Ariell is doing well in rehab. She is feeling better mentally and in a better place with her mood since when she was first released.  She is sleeping well.      Expected Outcomes Short: Continue to attend [facility] regularly for regular exercise and social engagement. Long: Continue to improve symptoms and manage a positive mental state. Short: Conitnue to exercise for mental boost LOng; Continue to stay positive      Interventions Encouraged to attend Pulmonary Rehabilitation for the exercise Encouraged to attend Pulmonary Rehabilitation for the exercise      Continue Psychosocial Services  Follow up required by staff Follow up required by staff               Psychosocial Discharge (Final Psychosocial Re-Evaluation):  Psychosocial Re-Evaluation - 05/03/23 1429       Psychosocial Re-Evaluation   Current issues with History of Depression    Comments Joyel is doing well in rehab. She is feeling better mentally and in a better place with her mood since when she was first released.  She is sleeping well.    Expected Outcomes Short: Conitnue to exercise for mental boost LOng; Continue to stay positive    Interventions Encouraged to attend Pulmonary Rehabilitation for the exercise    Continue Psychosocial Services  Follow  up required by staff             Education: Education Goals: Education classes will be provided on a weekly basis, covering required topics. Participant will state understanding/return demonstration of topics presented.  Learning Barriers/Preferences:  Learning Barriers/Preferences - 01/16/23 1215       Learning Barriers/Preferences   Learning Barriers None    Learning Preferences None              General Pulmonary Education Topics:  Infection Prevention: - Provides verbal and written material to individual with discussion of infection control including proper hand washing and proper equipment cleaning during exercise session. Flowsheet Row Pulmonary Rehab from 04/12/2023 in [facility] Card[facility] Pulmonary Re[facility]  Date 01/16/23  Educator [facility]  Instruction Review Code 1- Verbalizes Understanding       Falls Prevention: - Provides verbal and written material to individual with discussion of falls prevention and safety. Flowsheet Row Pulmonary Rehab from [facility]12/2023 in [facility] Card[facility] Pulmonary Re[facility]  Date 01/16/23  Educator [facility]  Instruction Review Code 1- Verbalizes Understanding       Chronic Lung Disease Review: - Group verbal instruction with posters, models, PowerPoint presentations and videos,  to review new updates, new respiratory medications, new advancements in procedures and treatments. Providing information on websites and "800" numbers for continued self-education. Includes information about supplement oxygen, available portable oxygen systems, continuous and intermittent flow rates, oxygen safety, concentrators, and Medicare reimbursement for oxygen. Explanation of Pulmonary Drugs, including class, frequency, complications, importance of spacers, rinsing mouth after steroid MDI's, and proper cleaning methods for nebulizers. Review of basic lung anatomy and physiology related to function, structure, and complications of lung disease. Review of risk factors. Discussion about methods for diagnosing sleep apnea and types of masks and machines for OSA. Includes a review of the use of types of environmental controls: home humidity, furnaces, filters, dust mite/pet prevention, HEPA vacuums. Discussion about weather changes, air quality and the benefits of nasal washing. Instruction on Warning signs, infection symptoms, calling MD promptly, preventive modes, and value of  vaccinations. Review of effective airway clearance, coughing and/or vibration techniques. Emphasizing that all should Create an Action Plan. Written material given at graduation. Flowsheet Row Pulmonary Rehab from 04/12/2023 in [facility] Cardiac [facility] Pulmonary Rehab  Education need identified 01/26/23       AED/CPR: - Group verbal and written instruction with the use of models to demonstrate the basic use of the AED with the basic ABC's of resuscitation.    Anatomy and Cardiac Procedures: - Group verbal and visual presentation and models provide information about basic cardiac anatomy and function. Reviews the testing methods done to diagnose heart disease and the outcomes of the test results. Describes the treatment choices: Medical Management, Angioplasty, or Coronary Bypass Surgery for treating various heart conditions including Myocardial Infarction, Angina, Valve Disease, and Cardiac Arrhythmias.  Written material given at graduation.   Medication Safety: - Group verbal and visual instruction to review commonly prescribed medications for heart and lung disease. Reviews the medication, class of the drug, and side effects. Includes the steps to properly store meds and maintain the prescription regimen.  Written material given at graduation.   Other: -Provides group and verbal instruction on various topics (see comments)   Knowledge Questionnaire Score:  Knowledge Questionnaire Score - 01/26/23 1557       Knowledge Questionnaire Score   Pre Score 15/18              Core Components/Risk Factors/Patient  Goals at Admission:  Personal Goals and Risk Factors at Admission - 01/26/23 1646       Core Components/Risk Factors/Patient Goals on Admission    Weight Management Yes;Weight Loss;Obesity    Intervention Weight Management: Develop a combined nutrition and exercise program designed to reach desired caloric intake, while maintaining appropriate intake of nutrient and fiber, sodium  and fats, and appropriate energy expenditure required for the weight goal.;Weight Management: Provide education and appropriate resources to help participant work on and attain dietary goals.;Weight Management/Obesity: Establish reasonable short term and long term weight goals.;Obesity: Provide education and appropriate resources to help participant work on and attain dietary goals.    Admit Weight 275 lb (124.7 kg)    Goal Weight: Short Term 270 lb (122.5 kg)    Goal Weight: Long Term 225 lb (102.1 kg)    Expected Outcomes Short Term: Continue to assess and modify interventions until short term weight is achieved;Long Term: Adherence to nutrition and physical activity/exercise program aimed toward attainment of established weight goal;Weight Loss: Understanding of general recommendations for a balanced deficit meal plan, which promotes 1-2 lb weight loss per week and includes a negative energy balance of [phone] kcal/d;Understanding recommendations for meals to include 15-35% energy as protein, 25-35% energy from fat, 35-60% energy from carbohydrates, less than 200mg  of dietary cholesterol, 20-35 gm of total fiber daily;Understanding of distribution of calorie intake throughout the day with the consumption of 4-5 meals/snacks    Improve shortness of breath with ADL's Yes    Intervention Provide education, individualized exercise plan and daily activity instruction to help decrease symptoms of SOB with activities of daily living.    Expected Outcomes Short Term: Improve cardiorespiratory fitness to achieve a reduction of symptoms when performing ADLs;Long Term: Be able to perform more ADLs without symptoms or delay the onset of symptoms    Increase knowledge of respiratory medications and ability to use respiratory devices properly  Yes    Intervention Provide education and demonstration as needed of appropriate use of medications, inhalers, and oxygen therapy.    Expected Outcomes Short Term: Achieves  understanding of medications use. Understands that oxygen is a medication prescribed by physician. Demonstrates appropriate use of inhaler and oxygen therapy.;Long Term: Maintain appropriate use of medications, inhalers, and oxygen therapy.             Education:Diabetes - Individual verbal and written instruction to review signs/symptoms of diabetes, desired ranges of glucose level fasting, after meals and with exercise. Acknowledge that pre and post exercise glucose checks will be done for 3 sessions at entry of program.   Know Your Numbers and Heart Failure: - Group verbal and visual instruction to discuss disease risk factors for cardiac and pulmonary disease and treatment options.  Reviews associated critical values for Overweight/Obesity, Hypertension, Cholesterol, and Diabetes.  Discusses basics of heart failure: signs/symptoms and treatments.  Introduces Heart Failure Zone chart for action plan for heart failure.  Written material given at graduation.   Core Components/Risk Factors/Patient Goals Review:   Goals and Risk Factor Review     Row Name 04/10/23 1405 05/03/23 1433           Core Components/Risk Factors/Patient Goals Review   Personal Goals Review Improve shortness of breath with ADL's Improve shortness of breath with ADL's;Weight Management/Obesity;Increase knowledge of respiratory medications and ability to use respiratory devices properly.      Review Spoke to patient about their shortness of breath and what they can do to improve. Patient  has been informed of breathing techniques when starting the program. Patient is informed to tell staff if they have had any med changes and that certain meds they are taking or not taking can be causing shortness of breath. Anael is doing well with her weight and continues to work on weight loss.  Her breathing is getting better and she is no longer needing oxygen for exercise.  She is still using her nebulizer and doing well with it.       Expected Outcomes Short: Attend [facility] regularly to improve shortness of breath with ADL's. Long: maintain independence with ADL's Short: Continue to exercise to help with weight loss long: Continue to monitor risk factors               Core Components/Risk Factors/Patient Goals at Discharge (Final Review):   Goals and Risk Factor Review - 05/03/23 1433       Core Components/Risk Factors/Patient Goals Review   Personal Goals Review Improve shortness of breath with ADL's;Weight Management/Obesity;Increase knowledge of respiratory medications and ability to use respiratory devices properly.    Review Etty is doing well with her weight and continues to work on weight loss.  Her breathing is getting better and she is no longer needing oxygen for exercise.  She is still using her nebulizer and doing well with it.    Expected Outcomes Short: Continue to exercise to help with weight loss long: Continue to monitor risk factors             ITP Comments:  ITP Comments     Row Name 01/16/23 1212 01/26/23 1553 02/15/23 0932 03/13/23 1359 03/15/23 1401   ITP Comments Virtual Visit completed. Patient informed on EP and RD appointment and 6 Minute walk test. Patient also informed of patient health questionnaires on My Chart. Patient Verbalizes understanding. Visit diagnosis can be found in [facility] 12/27/2022. Completed and gym orientation. Initial ITP created and sent for review to Dr. Vida Rigger,  Medical Director. 30 Day review completed. Medical Director ITP review done, changes made as directed, and signed approval by Medical Director.    new to program First full day of exercise!  Patient was oriented to gym and equipment including functions, settings, policies, and procedures.  Patient's individual exercise prescription and treatment plan were reviewed.  All starting workloads were established based on the results of the 6 minute walk test done at initial orientation visit.  The plan  for exercise progression was also introduced and progression will be customized based on patient's performance and goals. 30 day review completed. ITP sent to Dr. Jinny Sanders, Medical Director of  Pulmonary Rehab. Continue with ITP unless changes are made by physician. First day of exercise was just this week, pt delayed her start.    Row Name 04/04/23 1159 04/12/23 0936 05/10/23 1129       ITP Comments Shiniqua called to let us know that she was still have significant back pain.  She went to ED for it and was given a shot and pain killers.  She has been resting and icing.  She wants to give it until Monday 5/13 to try again. 30 Day review completed. Medical Director ITP review done, changes made as directed, and signed approval by Medical Director. 30 Day review completed. Medical Director ITP review done, changes made as directed, and signed approval by Medical Director.              Comments:

## 2023-05-15 ENCOUNTER — Encounter: Payer: 59 | Admitting: *Deleted

## 2023-05-15 DIAGNOSIS — J4540 Moderate persistent asthma, uncomplicated: Secondary | ICD-10-CM

## 2023-05-15 NOTE — Progress Notes (Signed)
Daily Session Note  Patient Details  Name: RUIE WOLFROM MRN: [id number] Date of Birth: 01-26-1976 Referring Provider:   Flowsheet Row Pulmonary Rehab from 01/26/2023 in [facility] Cardiac and Pulmonary Rehab  Referring Provider Sarina Ser MD       Encounter Date: 05/15/2023  Check In:  Session Check In - 05/15/23 1414       Check-In   Supervising physician immediately available to respond to emergencies See telemetry face sheet for immediately available ER MD    Location [facility]-Cardiac & Pulmonary Rehab    Staff Present Susann Givens, RN BSN;Joseph Reino Kent, Guinevere Ferrari, RN, [place]    Virtual Visit No    Medication changes reported     No    Fall or balance concerns reported    No    Warm-up and Cool-down Performed on first and last piece of equipment    Resistance Training Performed Yes    VAD Patient? No    PAD/SET Patient? No      Pain Assessment   Currently in Pain? No/denies                Social History   Tobacco Use  Smoking Status Former   Packs/day: 0.50   Years: 5.00   Additional pack years: 0.00   Total pack years: 2.50   Types: Cigarettes   Quit date: 2000   Years since quitting: 24.4  Smokeless Tobacco Never    Goals Met:  Independence with exercise equipment Exercise tolerated well No report of concerns or symptoms today Strength training completed today  Goals Unmet:  Not Applicable  Comments: Pt able to follow exercise prescription today without complaint.  Will continue to monitor for progression.    Dr. Bethann Punches is Medical Director for [facility] Cardiac Rehabilitation.  Dr. Vida Rigger is Medical Director for [facility] Pulmonary Rehabilitation.

## 2023-05-17 ENCOUNTER — Telehealth: Payer: Self-pay | Admitting: Cardiovascular Disease

## 2023-05-17 NOTE — Telephone Encounter (Signed)
Spoke with patient and discussed her symptoms. Offered appointment for next Friday with provider. She was agreeable with plan and verbalized understanding with no further questions at this time.

## 2023-05-17 NOTE — Telephone Encounter (Signed)
Patient c/o Palpitations:  High priority if patient c/o lightheadedness, shortness of breath, or chest pain  How long have you had palpitations/irregular HR/ Afib? Are you having the symptoms now?  Patient states she has been having palpitations for about 1 month. Patient states she is having palpitations right now   Are you currently experiencing lightheadedness, SOB or CP?  No   Do you have a history of afib (atrial fibrillation) or irregular heart rhythm?  Patient is unsure  Have you checked your BP or HR? (document readings if available):  Patient states her BP is fine She has not checked her HR  Are you experiencing any other symptoms?  SOB (not currently)

## 2023-05-22 ENCOUNTER — Encounter: Payer: 59 | Admitting: *Deleted

## 2023-05-22 DIAGNOSIS — J4540 Moderate persistent asthma, uncomplicated: Secondary | ICD-10-CM

## 2023-05-22 NOTE — Progress Notes (Signed)
Daily Session Note  Patient Details  Name: MAELANI YARBRO MRN: [id number] Date of Birth: 1976-01-29 Referring Provider:   Flowsheet Row Pulmonary Rehab from 01/26/2023 in [facility] Cardiac and Pulmonary Rehab  Referring Provider Sarina Ser MD       Encounter Date: 05/22/2023  Check In:  Session Check In - 05/22/23 1416       Check-In   Supervising physician immediately available to respond to emergencies See telemetry face sheet for immediately available ER MD    Location [facility]-Cardiac & Pulmonary Rehab    Staff Present Susann Givens, RN BSN;Laureen Manson Passey, BS, RRT, CPFT;Megan Katrinka Blazing, RN, ADN    Virtual Visit No    Medication changes reported     No    Fall or balance concerns reported    No    Warm-up and Cool-down Performed on first and last piece of equipment    Resistance Training Performed Yes    VAD Patient? No    PAD/SET Patient? No      Pain Assessment   Currently in Pain? No/denies                Social History   Tobacco Use  Smoking Status Former   Packs/day: 0.50   Years: 5.00   Additional pack years: 0.00   Total pack years: 2.50   Types: Cigarettes   Quit date: 2000   Years since quitting: 24.4  Smokeless Tobacco Never    Goals Met:  Independence with exercise equipment Exercise tolerated well No report of concerns or symptoms today Strength training completed today  Goals Unmet:  Not Applicable  Comments: Pt able to follow exercise prescription today without complaint.  Will continue to monitor for progression.    Dr. Bethann Punches is Medical Director for [facility] Cardiac Rehabilitation.  Dr. Vida Rigger is Medical Director for [facility] Pulmonary Rehabilitation.

## 2023-05-25 NOTE — Progress Notes (Signed)
Cardiology Office Note  Date:  05/26/2023   ID:  Blima, Huitt Nov 30, 1975, MRN [id number]  PCP:  [facility]   Chief Complaint  Patient presents with   Palpitations    Palpitations. SOB. Medications reviewed verbally by patient.      HPI:  [name] is a 47 year old woman with past medical history of Admitted to the hospital 08/13/20 with COVID-19 pneumonia & ARDS requiring intubation on 9/17 and mechanical ventilation.   Proning protocols were followed while in ICU.   required maximal ventilation support with sedation and remained critically ill.  She was weaned to FiO2 60% on 10/1.  Tracheostomy was placed on 09/04/20.  Patient began tolerating trach collar O2 in short durations 10/12-13 and began OOB to chair.   tracheal aspirate culture growing Pseudomonas. Sepsis resolved before discharge. history of morbid obesity, Ef 45 to 50% now ejection fraction 50 to 55%  Who presents for follow-up of her shortness of breath, mildly decreased ejection fraction September 2022 in the setting of anemia, heavy menses,  leg edema, chest pain, irregular beats  Last seen in clinic May 2024 Recently seen in clinic Apr 17, 2023 At that time reported having palpitations and was started on bisoprolol 5 mg daily She reports today bisoprolol was not covered by Medicaid She continues to have palpitations from PVCs Takes propranolol as needed  Scheduled for trach removal September 2024 Trying to lose weight in preparation  Echo May 2023 EF 50 to 55%  History of heavy menses, started on new medication Previously treated with iron infusions, l Hemoglobin 9.8   emergency room Apr 01, 2022 for palpitations  EKG personally reviewed by myself on todays visit EKG Interpretation Date/Time:  Friday May 26 2023 14:20:48 EDT Ventricular Rate:  88 PR Interval:  174 QRS Duration:  84 QT Interval:  372 QTC Calculation: 450 R Axis:   -2  Text Interpretation: Normal  sinus rhythm Nonspecific T wave abnormality When compared with ECG of 14-Aug-2022 12:06, Sinus rhythm has replaced Ectopic atrial rhythm Inverted T waves have replaced nonspecific T wave abnormality in Anterior leads Confirmed by Julien Nordmann [id number]) on 05/26/2023 2:45:52 PM    Other past medical history reviewed Oxygen machine at night  Problems with anemia, hemoglobin down in the 6 range requiring transfusion during September 2022   PMH:   has a past medical history of Acute encephalopathy (08/13/2020), Acute renal failure (ARF) (HCC) (08/13/2020), Anemia, Asthma, Cervical cancer (HCC), Hypothyroidism, Iron deficiency anemia, Obesity (BMI 30-39.9), Pneumonia due to COVID-19 virus (08/13/2020), Subglottic stenosis, Tracheal stenosis, and Tracheostomy tube present (HCC).  PSH:    Past Surgical History:  Procedure Laterality Date   CARDIAC CATHETERIZATION     CERVICAL BIOPSY  W/ LOOP ELECTRODE EXCISION     CESAREAN SECTION     CHOLECYSTECTOMY     LARYNGOSCOPY  04/16/2021   LARYNGOSCOPY  02/25/2022   LARYNGOSCOPY  08/13/2021   LEG SURGERY     TRACHEOSTOMY TUBE PLACEMENT N/A 09/04/2020   Procedure: TRACHEOSTOMY;  Surgeon: Linus Salmons, MD;  Location: [facility] ORS;  Service: ENT;  Laterality: N/A;   TUBAL LIGATION      Current Outpatient Medications  Medication Sig Dispense Refill   acetaminophen (TYLENOL) 500 MG tablet Take 500 mg by mouth every 6 (six) hours as needed for headache, fever, moderate pain or mild pain.     albuterol (PROVENTIL) (2.5 MG/3ML) 0.083% nebulizer solution Take 3 mLs (2.5 mg total) by nebulization every 6 (six)  hours as needed for wheezing or shortness of breath. 360 mL 11   ascorbic acid (VITAMIN C) 500 MG tablet Take 1 tablet (500 mg total) by mouth daily. 30 tablet 0   bisoprolol (ZEBETA) 5 MG tablet Take 1 tablet (5 mg total) by mouth daily. 90 tablet 3   formoterol (PERFOROMIST) 20 MCG/2ML nebulizer solution Take 2 mLs (20 mcg total) by nebulization 2  (two) times daily. Use before the budesonide, twice a day. 120 mL 11   furosemide (LASIX) 20 MG tablet Take 1 tablet (20 mg total) by mouth daily as needed for edema (shortness of breath). 90 tablet 3   HYDROcodone-acetaminophen (NORCO/VICODIN) 5-325 MG tablet Take 1 tablet by mouth every 4 (four) hours as needed for moderate pain. 12 tablet 0   hydrOXYzine (ATARAX) 10 MG tablet Take 10 mg by mouth 3 (three) times daily.     iron polysaccharides (NIFEREX) 150 MG capsule Take 1 capsule (150 mg total) by mouth daily. 30 capsule 1   levothyroxine (SYNTHROID) 125 MCG tablet Take 250 mcg by mouth daily.     meloxicam (MOBIC) 15 MG tablet Take 1 tablet (15 mg total) by mouth daily. 30 tablet 0   methocarbamol (ROBAXIN) 500 MG tablet Take 1 tablet (500 mg total) by mouth 4 (four) times daily. 30 tablet 0   norethindrone (AYGESTIN) 5 MG tablet Take 10 mg by mouth daily.     nystatin cream (MYCOSTATIN) Apply topically 2 (two) times daily. 30 g 0   OXYGEN Inhale into the lungs at bedtime as needed. Up to 4 liters     pantoprazole (PROTONIX) 40 MG tablet Take 1 tablet (40 mg total) by mouth daily. 30 tablet 2   propranolol (INDERAL) 10 MG tablet Take 1 tablet (10 mg total) by mouth 3 (three) times daily as needed (palpitations). 90 tablet 6   budesonide (PULMICORT) 0.5 MG/2ML nebulizer solution Take 2 mLs (0.5 mg total) by nebulization 2 (two) times daily. 120 mL 5   No current facility-administered medications for this visit.    Allergies:   Patient has no known allergies.   Social History:  The patient  reports that she quit smoking about 24 years ago. Her smoking use included cigarettes. She has a 2.50 pack-year smoking history. She has never used smokeless tobacco. She reports that she does not drink alcohol and does not use drugs.   Family History:   family history includes Heart disease in her father; Hyperlipidemia in her mother; Hypertension in her mother; Liver cancer in her father.    Review  of Systems: Review of Systems  Constitutional: Negative.   HENT: Negative.    Respiratory: Negative.    Cardiovascular: Negative.   Gastrointestinal: Negative.   Musculoskeletal: Negative.   Neurological: Negative.   Psychiatric/Behavioral: Negative.    All other systems reviewed and are negative.   PHYSICAL EXAM: VS:  BP 132/78 (BP Location: Left Arm, Patient Position: Sitting, Cuff Size: Normal)   Pulse 88   Ht 5\' 9"  (1.753 m)   Wt 267 lb 6.4 oz (121.3 kg)   SpO2 98%   BMI 39.49 kg/m  , BMI Body mass index is 39.49 kg/m. Constitutional:  oriented to person, place, and time. No distress.  HENT:  Head: Grossly normal Eyes:  no discharge. No scleral icterus.  Neck: No JVD, no carotid bruits  Cardiovascular: Regular rate and rhythm, no murmurs appreciated Pulmonary/Chest: Clear to auscultation bilaterally, no wheezes or rails Abdominal: Soft.  no distension.  no tenderness.  Musculoskeletal: Normal range of motion Neurological:  normal muscle tone. Coordination normal. No atrophy Skin: Skin warm and dry Psychiatric: normal affect, pleasant  Recent Labs: 07/16/2022: TSH 167.262 07/18/2022: Magnesium 2.2 08/14/2022: ALT 26 04/11/2023: BUN 12; Creatinine 0.80; Hemoglobin 9.8; Platelet Count 366; Potassium 3.9; Sodium 138    Lipid Panel Lab Results  Component Value Date   TRIG 1,906 (H) 08/17/2020    Wt Readings from Last 3 Encounters:  05/26/23 267 lb 6.4 oz (121.3 kg)  04/17/23 272 lb (123.4 kg)  04/11/23 276 lb 6.4 oz (125.4 kg)     ASSESSMENT AND PLAN:  Problem List Items Addressed This Visit     Acute renal failure (ARF) (HCC)   Acute respiratory failure (HCC)   Other Visit Diagnoses     Dilated cardiomyopathy (HCC)    -  Primary   SOB (shortness of breath)       Anemia, unspecified type       Morbid obesity (HCC)       Leg edema       Palpitations       Relevant Orders   EKG 12-Lead (Completed)     Shortness of breath  Ejection fraction low normal  on  echocardiogram May 2023 50% No further workup needed at this time  Anemia Hemoglobin low 9.8 Was hospitalized September 2022 for anemia required transfusion heavy menses Receiving iron infusions  Cardiomyopathy Ejection fraction 50% on echocardiogram  Denies ankle swelling, does not take Lasix as needed as she feels euvolemic  Irregular heartbeat Zio monitor with PVCs, having symptoms Prescription provided for bisoprolol 5 mg daily, she will fill this through [email or web address] $9 Continue propranolol as needed  Preop cardiovascular evaluation Reports that she is preparing for trach removal September 2024 Acceptable risk, no further cardiac workup needed    Total encounter time more than 40 minutes  Greater than 50% was spent in counseling and coordination of care with the patient    Signed, Dossie Arbour, M.D., Ph.D. [facility] [place], [place] [phone]

## 2023-05-26 ENCOUNTER — Ambulatory Visit: Payer: 59 | Attending: Cardiovascular Disease | Admitting: Cardiovascular Disease

## 2023-05-26 ENCOUNTER — Encounter: Payer: Self-pay | Admitting: Cardiovascular Disease

## 2023-05-26 VITALS — BP 132/78 | HR 88 | Ht 69.0 in | Wt 267.4 lb

## 2023-05-26 DIAGNOSIS — D649 Anemia, unspecified: Secondary | ICD-10-CM | POA: Diagnosis not present

## 2023-05-26 DIAGNOSIS — N170 Acute kidney failure with tubular necrosis: Secondary | ICD-10-CM

## 2023-05-26 DIAGNOSIS — R600 Localized edema: Secondary | ICD-10-CM

## 2023-05-26 DIAGNOSIS — I420 Dilated cardiomyopathy: Secondary | ICD-10-CM | POA: Diagnosis not present

## 2023-05-26 DIAGNOSIS — J9601 Acute respiratory failure with hypoxia: Secondary | ICD-10-CM

## 2023-05-26 DIAGNOSIS — R002 Palpitations: Secondary | ICD-10-CM

## 2023-05-26 DIAGNOSIS — R0602 Shortness of breath: Secondary | ICD-10-CM | POA: Diagnosis not present

## 2023-05-26 MED ORDER — BISOPROLOL FUMARATE 5 MG PO TABS: 5.0000 mg | ORAL_TABLET | Freq: Every day | ORAL | 6 refills | Status: AC

## 2023-05-26 NOTE — Patient Instructions (Addendum)
Medication Instructions:  Bisoprolol 5 mg once daily  If you need a refill on your cardiac medications before your next appointment, please call your pharmacy.   Lab work: No new labs needed  Testing/Procedures: No new testing needed  Follow-Up: At [facility], you and your health needs are our priority.  As part of our continuing mission to provide you with exceptional heart care, we have created designated Provider Care Teams.  These Care Teams include your primary Cardiologist (physician) and Advanced Practice Providers (APPs -  Physician Assistants and Nurse Practitioners) who all work together to provide you with the care you need, when you need it.  You will need a follow up appointment in 12 months  Providers on your designated Care Team:   Nicolasa Ducking, NP Eula Listen, PA-C Cadence Fransico Michael, [place]  COVID-19 Vaccine Information can be found at: [email or web address] For questions related to vaccine distribution or appointments, please email vaccine@[email or web address] or call 4310434295.

## 2023-05-29 ENCOUNTER — Encounter: Payer: 59 | Attending: Pulmonary Disease | Admitting: *Deleted

## 2023-05-29 DIAGNOSIS — J4540 Moderate persistent asthma, uncomplicated: Secondary | ICD-10-CM | POA: Diagnosis present

## 2023-05-29 NOTE — Progress Notes (Signed)
Daily Session Note  Patient Details  Name: JANAN KRAUSZ MRN: [id number] Date of Birth: 03/03/1976 Referring Provider:   Flowsheet Row Pulmonary Rehab from 01/26/2023 in [facility] Cardiac and Pulmonary Rehab  Referring Provider Sarina Ser MD       Encounter Date: 05/29/2023  Check In:  Session Check In - 05/29/23 1451       Check-In   Supervising physician immediately available to respond to emergencies See telemetry face sheet for immediately available ER MD    Location [facility]-Cardiac & Pulmonary Rehab    Staff Present Susann Givens, RN BSN;Joseph Reino Kent, Guinevere Ferrari, RN, [place]    Virtual Visit No    Medication changes reported     No    Fall or balance concerns reported    No    Warm-up and Cool-down Performed on first and last piece of equipment    Resistance Training Performed Yes    VAD Patient? No    PAD/SET Patient? No      Pain Assessment   Currently in Pain? No/denies                Social History   Tobacco Use  Smoking Status Former   Packs/day: 0.50   Years: 5.00   Additional pack years: 0.00   Total pack years: 2.50   Types: Cigarettes   Quit date: 2000   Years since quitting: 24.5  Smokeless Tobacco Never    Goals Met:  Independence with exercise equipment Exercise tolerated well No report of concerns or symptoms today Strength training completed today  Goals Unmet:  Not Applicable  Comments: Pt able to follow exercise prescription today without complaint.  Will continue to monitor for progression.    Dr. Bethann Punches is Medical Director for [facility] Cardiac Rehabilitation.  Dr. Vida Rigger is Medical Director for [facility] Pulmonary Rehabilitation.

## 2023-06-05 ENCOUNTER — Encounter: Payer: 59 | Admitting: *Deleted

## 2023-06-05 DIAGNOSIS — J4540 Moderate persistent asthma, uncomplicated: Secondary | ICD-10-CM

## 2023-06-05 NOTE — Progress Notes (Signed)
Daily Session Note  Patient Details  Name: JANACIA GENOVA MRN: [id number] Date of Birth: [birth year] Referring Provider:   Flowsheet Row Pulmonary Rehab from 01/26/2023 in [facility] Cardiac and Pulmonary Rehab  Referring Provider Sarina Ser MD       Encounter Date: 06/05/2023  Check In:  Session Check In - 06/05/23 1520       Check-In   Supervising physician immediately available to respond to emergencies See telemetry face sheet for immediately available ER MD    Location [facility]-Cardiac & Pulmonary Rehab    Staff Present Cora Collum, RN, BSN, CCRP;Meredith Jewel Baize, RN BSN    Virtual Visit No    Medication changes reported     No    Fall or balance concerns reported    No    Warm-up and Cool-down Performed on first and last piece of equipment    Resistance Training Performed Yes    VAD Patient? No    PAD/SET Patient? No      Pain Assessment   Currently in Pain? No/denies                Social History   Tobacco Use  Smoking Status Former   Packs/day: 0.50   Years: 5.00   Additional pack years: 0.00   Total pack years: 2.50   Types: Cigarettes   Quit date: 2000   Years since quitting: 24.5  Smokeless Tobacco Never    Goals Met:  Proper associated with RPD/PD & O2 Sat Independence with exercise equipment Exercise tolerated well No report of concerns or symptoms today  Goals Unmet:  Not Applicable  Comments: Pt able to follow exercise prescription today without complaint.  Will continue to monitor for progression.    Dr. Bethann Punches is Medical Director for [facility] Cardiac Rehabilitation.  Dr. Vida Rigger is Medical Director for [facility] Pulmonary Rehabilitation.

## 2023-06-06 ENCOUNTER — Encounter: Payer: Self-pay | Admitting: *Deleted

## 2023-06-06 DIAGNOSIS — J4540 Moderate persistent asthma, uncomplicated: Secondary | ICD-10-CM

## 2023-06-06 NOTE — Progress Notes (Signed)
Pulmonary Individual Treatment Plan  Patient Details  Name: Kathy Vega MRN: [id number] Date of Birth: 04-04-1976 Referring Provider:   Flowsheet Row Pulmonary Rehab from 01/26/2023 in [facility] Cardiac and Pulmonary Rehab  Referring Provider Sarina Ser MD       Initial Encounter Date:  Flowsheet Row Pulmonary Rehab from 01/26/2023 in [facility] Cardiac and Pulmonary Rehab  Date 01/26/23       Visit Diagnosis: Moderate persistent asthma in adult without complication  Patient's Home Medications on Admission:  Current Outpatient Medications:    acetaminophen (TYLENOL) 500 MG tablet, Take 500 mg by mouth every 6 (six) hours as needed for headache, fever, moderate pain or mild pain., Disp: , Rfl:    albuterol (PROVENTIL) (2.5 MG/3ML) 0.083% nebulizer solution, Take 3 mLs (2.5 mg total) by nebulization every 6 (six) hours as needed for wheezing or shortness of breath., Disp: 360 mL, Rfl: 11   ascorbic acid (VITAMIN C) 500 MG tablet, Take 1 tablet (500 mg total) by mouth daily., Disp: 30 tablet, Rfl: 0   bisoprolol (ZEBETA) 5 MG tablet, Take 1 tablet (5 mg total) by mouth daily., Disp: 30 tablet, Rfl: 6   budesonide (PULMICORT) 0.5 MG/2ML nebulizer solution, Take 2 mLs (0.5 mg total) by nebulization 2 (two) times daily., Disp: 120 mL, Rfl: 5   formoterol (PERFOROMIST) 20 MCG/2ML nebulizer solution, Take 2 mLs (20 mcg total) by nebulization 2 (two) times daily. Use before the budesonide, twice a day., Disp: 120 mL, Rfl: 11   furosemide (LASIX) 20 MG tablet, Take 1 tablet (20 mg total) by mouth daily as needed for edema (shortness of breath)., Disp: 90 tablet, Rfl: 3   HYDROcodone-acetaminophen (NORCO/VICODIN) 5-325 MG tablet, Take 1 tablet by mouth every 4 (four) hours as needed for moderate pain., Disp: 12 tablet, Rfl: 0   hydrOXYzine (ATARAX) 10 MG tablet, Take 10 mg by mouth 3 (three) times daily., Disp: , Rfl:    iron polysaccharides (NIFEREX) 150 MG capsule, Take 1 capsule (150 mg total) by  mouth daily., Disp: 30 capsule, Rfl: 1   levothyroxine (SYNTHROID) 125 MCG tablet, Take 250 mcg by mouth daily., Disp: , Rfl:    meloxicam (MOBIC) 15 MG tablet, Take 1 tablet (15 mg total) by mouth daily., Disp: 30 tablet, Rfl: 0   methocarbamol (ROBAXIN) 500 MG tablet, Take 1 tablet (500 mg total) by mouth 4 (four) times daily., Disp: 30 tablet, Rfl: 0   norethindrone (AYGESTIN) 5 MG tablet, Take 10 mg by mouth daily., Disp: , Rfl:    nystatin cream (MYCOSTATIN), Apply topically 2 (two) times daily., Disp: 30 g, Rfl: 0   OXYGEN, Inhale into the lungs at bedtime as needed. Up to 4 liters, Disp: , Rfl:    pantoprazole (PROTONIX) 40 MG tablet, Take 1 tablet (40 mg total) by mouth daily., Disp: 30 tablet, Rfl: 2   propranolol (INDERAL) 10 MG tablet, Take 1 tablet (10 mg total) by mouth 3 (three) times daily as needed (palpitations)., Disp: 90 tablet, Rfl: 6  Past Medical History: Past Medical History:  Diagnosis Date   Acute encephalopathy 08/13/2020   Acute renal failure (ARF) (HCC) 08/13/2020   Anemia    Asthma    Cervical cancer (HCC)    Hypothyroidism    Iron deficiency anemia    Obesity (BMI 30-39.9)    Pneumonia due to COVID-19 virus 08/13/2020   requiring trach and prolonged hospitalization   Subglottic stenosis    Tracheal stenosis    Tracheostomy tube present (HCC)  Tobacco Use: Social History   Tobacco Use  Smoking Status Former   Packs/day: 0.50   Years: 5.00   Additional pack years: 0.00   Total pack years: 2.50   Types: Cigarettes   Quit date: 2000   Years since quitting: 24.5  Smokeless Tobacco Never    Labs: Review Flowsheet  More data exists      Latest Ref Rng & Units 08/16/2020 08/17/2020 08/18/2020 08/20/2020 08/31/2020  Labs for ITP Cardiac and Pulmonary Rehab  Trlycerides <150 mg/dL - 0,865  - - -  PH, Arterial 7.350 - 7.450 7.40  - 7.41  7.38  7.42  7.47   PCO2 arterial 32.0 - 48.0 mmHg 44  - 49  56  48  53   Bicarbonate 20.0 - 28.0 mmol/L 27.3  -  31.1  33.1  31.1  38.6   O2 Saturation % 98.8  - 92.3  94.6  93.1  92.5      Pulmonary Assessment Scores:  Pulmonary Assessment Scores     Row Name 01/26/23 1601         ADL UCSD   ADL Phase Entry     SOB Score total 100     Rest 3     Walk 4     Stairs 5     Bath 3     Dress 5     Shop 5       CAT Score   CAT Score 25       mMRC Score   mMRC Score 4              UCSD: Self-administered rating of dyspnea associated with activities of daily living (ADLs) 6-point scale (0 = "not at all" to 5 = "maximal or unable to do because of breathlessness")  Scoring Scores range from 0 to 120.  Minimally important difference is 5 units  CAT: CAT can identify the health impairment of COPD patients and is better correlated with disease progression.  CAT has a scoring range of zero to 40. The CAT score is classified into four groups of low (less than 10), medium (10 - 20), high (21-30) and very high (31-40) based on the impact level of disease on health status. A CAT score over 10 suggests significant symptoms.  A worsening CAT score could be explained by an exacerbation, poor medication adherence, poor inhaler technique, or progression of COPD or comorbid conditions.  CAT MCID is 2 points  mMRC: mMRC (Modified Medical Research Council) Dyspnea Scale is used to assess the degree of baseline functional disability in patients of respiratory disease due to dyspnea. No minimal important difference is established. A decrease in score of 1 point or greater is considered a positive change.   Pulmonary Function Assessment:  Pulmonary Function Assessment - 01/16/23 1215       Breath   Shortness of Breath Yes;Limiting activity             Exercise Target Goals: Exercise Program Goal: Individual exercise prescription set using results from initial 6 min walk test and THRR while considering  patient's activity barriers and safety.   Exercise Prescription Goal: Initial exercise  prescription builds to 30-45 minutes a day of aerobic activity, 2-3 days per week.  Home exercise guidelines will be given to patient during program as part of exercise prescription that the participant will acknowledge.  Education: Aerobic Exercise: - Group verbal and visual presentation on the components of exercise prescription. Introduces F.I.T.T principle from ACSM  for exercise prescriptions.  Reviews F.I.T.T. principles of aerobic exercise including progression. Written material given at graduation.   Education: Resistance Exercise: - Group verbal and visual presentation on the components of exercise prescription. Introduces F.I.T.T principle from ACSM for exercise prescriptions  Reviews F.I.T.T. principles of resistance exercise including progression. Written material given at graduation.    Education: Exercise & Equipment Safety: - Individual verbal instruction and demonstration of equipment use and safety with use of the equipment. Flowsheet Row Pulmonary Rehab from 04/12/2023 in [facility] Cardiac and Pulmonary Rehab  Date 01/16/23  Educator [facility]  Instruction Review Code 1- Verbalizes Understanding       Education: Exercise Physiology & General Exercise Guidelines: - Group verbal and written instruction with models to review the exercise physiology of the cardiovascular system and associated critical values. Provides general exercise guidelines with specific guidelines to those with heart or lung disease.    Education: Flexibility, Balance, Mind/Body Relaxation: - Group verbal and visual presentation with interactive activity on the components of exercise prescription. Introduces F.I.T.T principle from ACSM for exercise prescriptions. Reviews F.I.T.T. principles of flexibility and balance exercise training including progression. Also discusses the mind body connection.  Reviews various relaxation techniques to help reduce and manage stress (i.e. Deep breathing, progressive muscle  relaxation, and visualization). Balance handout provided to take home. Written material given at graduation.   Activity Barriers & Risk Stratification:  Activity Barriers & Cardiac Risk Stratification - 01/26/23 1617       Activity Barriers & Cardiac Risk Stratification   Activity Barriers Back Problems;Deconditioning;Muscular Weakness;Shortness of Breath;Other (comment)    Comments chronic back pain, bilateral knee pain, tracheostomy in place             6 Minute Walk:  6 Minute Walk     Row Name 01/26/23 1626         6 Minute Walk   Phase Initial     Distance 395 feet     Walk Time 5.1 minutes     # of Rest Breaks 5  0.52-1:01; 1:32-1:43; 2:28-2:41; 3:37-3:43; 4:44-4:57     MPH 0.88     METS 1.94     RPE 13     Perceived Dyspnea  3     VO2 Peak 6.8     Symptoms Yes (comment)     Comments Back pain 7/10, SOB, fatigue     Resting HR 90 bpm     Resting BP 118/74     Resting Oxygen Saturation  99 %     Exercise Oxygen Saturation  during 6 min walk 94 %     Max Ex. HR 114 bpm     Max Ex. BP 132/80     2 Minute Post BP 120/76       Interval HR   1 Minute HR 101     2 Minute HR 110     3 Minute HR 112     4 Minute HR 108     5 Minute HR 114     6 Minute HR 111     2 Minute Post HR 87     Interval Heart Rate? Yes       Interval Oxygen   Interval Oxygen? Yes     Baseline Oxygen Saturation % 99 %     1 Minute Oxygen Saturation % 94 %     1 Minute Liters of Oxygen 4 L  trach collar; 28% Fio2     2 Minute Oxygen Saturation %  98 %     2 Minute Liters of Oxygen 4 L     3 Minute Oxygen Saturation % 99 %     3 Minute Liters of Oxygen 4 L     4 Minute Oxygen Saturation % 99 %     4 Minute Liters of Oxygen 4 L     5 Minute Oxygen Saturation % 97 %     5 Minute Liters of Oxygen 4 L     6 Minute Oxygen Saturation % 96 %     6 Minute Liters of Oxygen 4 L     2 Minute Post Oxygen Saturation % 98 %     2 Minute Post Liters of Oxygen 4 L             Oxygen  Initial Assessment:  Oxygen Initial Assessment - 01/26/23 1557       Home Oxygen   Home Oxygen Device E-Tanks;Home Concentrator    Sleep Oxygen Prescription Continuous   Trach Collar   Liters per minute 5    Home Exercise Oxygen Prescription Continuous   28% Fio2- trach collar   Liters per minute 4   28% Fio2 - trach collar   Home Resting Oxygen Prescription Continuous   28% Fio2- trach collar   Liters per minute 4    Compliance with Home Oxygen Use Yes      Initial 6 min Walk   Oxygen Used Continuous    Liters per minute 4    FiO2% 28   trach collar     Program Oxygen Prescription   Program Oxygen Prescription Continuous    Liters per minute 4    FiO2% 28   trach collar   Comments RT met with patient and reviewed information with patient.      Intervention   Short Term Goals To learn and exhibit compliance with exercise, home and travel O2 prescription;To learn and understand importance of monitoring SPO2 with pulse oximeter and demonstrate accurate use of the pulse oximeter.;To learn and demonstrate proper pursed lip breathing techniques or other breathing techniques. ;To learn and understand importance of maintaining oxygen saturations>88%;To learn and demonstrate proper use of respiratory medications    Long  Term Goals Exhibits compliance with exercise, home  and travel O2 prescription;Maintenance of O2 saturations>88%;Compliance with respiratory medication;Demonstrates proper use of MDI's;Exhibits proper breathing techniques, such as pursed lip breathing or other method taught during program session;Verbalizes importance of monitoring SPO2 with pulse oximeter and return demonstration             Oxygen Re-Evaluation:  Oxygen Re-Evaluation     Row Name 03/13/23 1400 04/10/23 1403 05/03/23 1435 05/29/23 1412       Program Oxygen Prescription   Program Oxygen Prescription -- Continuous None None    Liters per minute -- 4 -- --    Comments -- patient wanted to try room  air for exercise today. now staying on room air and saturation are staying above 90% --      Home Oxygen   Home Oxygen Device -- E-Tanks;Home Concentrator None;Home Concentrator  not needing currently None;Home Concentrator    Sleep Oxygen Prescription -- Continuous None None    Liters per minute -- 5 -- --    Home Exercise Oxygen Prescription -- -- None None    Liters per minute -- 4 -- --    Home Resting Oxygen Prescription -- Continuous None None    Liters per minute -- 4 -- --  Compliance with Home Oxygen Use -- Yes Yes --      Goals/Expected Outcomes   Short Term Goals To learn and exhibit compliance with exercise, home and travel O2 prescription;To learn and understand importance of monitoring SPO2 with pulse oximeter and demonstrate accurate use of the pulse oximeter.;To learn and demonstrate proper pursed lip breathing techniques or other breathing techniques. ;To learn and understand importance of maintaining oxygen saturations>88%;To learn and demonstrate proper use of respiratory medications To learn and demonstrate proper pursed lip breathing techniques or other breathing techniques.  To learn and exhibit compliance with exercise, home and travel O2 prescription;To learn and understand importance of monitoring SPO2 with pulse oximeter and demonstrate accurate use of the pulse oximeter.;To learn and understand importance of maintaining oxygen saturations>88%;To learn and demonstrate proper pursed lip breathing techniques or other breathing techniques.  To learn and understand importance of monitoring SPO2 with pulse oximeter and demonstrate accurate use of the pulse oximeter.;To learn and understand importance of maintaining oxygen saturations>88%    Long  Term Goals Exhibits compliance with exercise, home  and travel O2 prescription;Maintenance of O2 saturations>88%;Compliance with respiratory medication;Demonstrates proper use of MDI's;Exhibits proper breathing techniques, such as pursed  lip breathing or other method taught during program session;Verbalizes importance of monitoring SPO2 with pulse oximeter and return demonstration Exhibits proper breathing techniques, such as pursed lip breathing or other method taught during program session Exhibits compliance with exercise, home  and travel O2 prescription;Verbalizes importance of monitoring SPO2 with pulse oximeter and return demonstration;Maintenance of O2 saturations>88%;Compliance with respiratory medication;Exhibits proper breathing techniques, such as pursed lip breathing or other method taught during program session Verbalizes importance of monitoring SPO2 with pulse oximeter and return demonstration;Maintenance of O2 saturations>88%    Comments Reviewed PLB technique with pt.  Talked about how it works and it's importance in maintaining their exercise saturations. Diaphragmatic and PLB breathing explained and performed with patient. Patient has a better understanding of how to do these exercises to help with breathing performance and relaxation. Patient performed breathing techniques adequately and to practice further at home. Patient would like to titrate her oxygen today and see how her oxygen saturations are while exercising. Kathy Vega is doing well in rehab.  She is no longer requiring oxygen therapy to maintain her saturations.  She continues to check them and uses her nebulizer routinely at home.  She is good about using her PLB as well.  She wants to ask about getting her trach out and plans to ask her doctor about it at end of month.  She is worried about closing off if it comes out, but really doesn't want it the rest of her life. She/has a pulse oximeter to check her oxygen saturation at home. Informed and explained why it is important to have one. Reviewed that oxygen saturations should be 88 percent and above. Patient verbalizes understanding.    Goals/Expected Outcomes Short: Become more profiecient at using PLB.   Long: Become  independent at using PLB. Short: practice PLB and diaphragmatic breathing at home. Long: Use PLB, titrate oxygen and diaphragmatic breathing independently post [facility]. Short: Continue to monitor saturations Long: Talk to doctor about trach removal. Short: monitor oxygen at home with exertion. Long: maintain oxygen saturations above 88 percent independently.             Oxygen Discharge (Final Oxygen Re-Evaluation):  Oxygen Re-Evaluation - 05/29/23 1412       Program Oxygen Prescription   Program Oxygen Prescription None  Home Oxygen   Home Oxygen Device None;Home Concentrator    Sleep Oxygen Prescription None    Home Exercise Oxygen Prescription None    Home Resting Oxygen Prescription None      Goals/Expected Outcomes   Short Term Goals To learn and understand importance of monitoring SPO2 with pulse oximeter and demonstrate accurate use of the pulse oximeter.;To learn and understand importance of maintaining oxygen saturations>88%    Long  Term Goals Verbalizes importance of monitoring SPO2 with pulse oximeter and return demonstration;Maintenance of O2 saturations>88%    Comments She/has a pulse oximeter to check her oxygen saturation at home. Informed and explained why it is important to have one. Reviewed that oxygen saturations should be 88 percent and above. Patient verbalizes understanding.    Goals/Expected Outcomes Short: monitor oxygen at home with exertion. Long: maintain oxygen saturations above 88 percent independently.             Initial Exercise Prescription:  Initial Exercise Prescription - 01/26/23 1600       Date of Initial Exercise RX and Referring Provider   Date 01/26/23    Referring Provider Sarina Ser MD      Oxygen   Oxygen Continuous    Liters 4   Trach collar; 28% Fio2   Maintain Oxygen Saturation 88% or higher      Recumbant Bike   Level 1    RPM 50    Watts 15    Minutes 15    METs 1.9      T5 Nustep   Level 1    SPM 80     Minutes 15    METs 1.9      Biostep-RELP   Level 1    SPM 50    Minutes 15    METs 1.9      Track   Laps 4   Breaks as needed   Minutes 15    METs 1.1      Prescription Details   Frequency (times per week) 2    Duration Progress to 30 minutes of continuous aerobic without signs/symptoms of physical distress      Intensity   THRR 40-80% of Max Heartrate 123- 156    Ratings of Perceived Exertion 11-13    Perceived Dyspnea 0-4      Progression   Progression Continue to progress workloads to maintain intensity without signs/symptoms of physical distress.      Resistance Training   Training Prescription Yes    Weight 3 lb   no overhead exercises; relayed to patient   Reps 10-15             Perform Capillary Blood Glucose checks as needed.  Exercise Prescription Changes:   Exercise Prescription Changes     Row Name 01/26/23 1600 03/16/23 1400 03/29/23 1300 04/12/23 1300 04/27/23 1500     Response to Exercise   Blood Pressure (Admit) 118/74 132/80 136/82 148/80 110/72   Blood Pressure (Exercise) 132/80 158/70 138/70 128/70 144/76   Blood Pressure (Exit) 120/76 130/80 140/70 112/72 104/62   Heart Rate (Admit) 90 bpm 114 bpm 102 bpm 90 bpm 95 bpm   Heart Rate (Exercise) 114 bpm 110 bpm 134 bpm 128 bpm 128 bpm   Heart Rate (Exit) 87 bpm 106 bpm 109 bpm 105 bpm 105 bpm   Oxygen Saturation (Admit) 99 % 92 % 98 % 92 % 94 %   Oxygen Saturation (Exercise) 94 % 96 % 95 % 88 % 89 %  Oxygen Saturation (Exit) 98 % 97 % 97 % 94 % 96 %   Rating of Perceived Exertion (Exercise) 13 11 13 13 16    Perceived Dyspnea (Exercise) 3 1 3 1 3    Symptoms SOB, fatigue, back pain 7/10 SOB SOB SOB SOB   Comments walk test results First full day of exercise -- -- --   Duration -- Progress to 30 minutes of  aerobic without signs/symptoms of physical distress Progress to 30 minutes of  aerobic without signs/symptoms of physical distress Progress to 30 minutes of  aerobic without  signs/symptoms of physical distress Continue with 30 min of aerobic exercise without signs/symptoms of physical distress.   Intensity -- THRR unchanged THRR unchanged THRR unchanged THRR unchanged     Progression   Progression -- Continue to progress workloads to maintain intensity without signs/symptoms of physical distress. Continue to progress workloads to maintain intensity without signs/symptoms of physical distress. Continue to progress workloads to maintain intensity without signs/symptoms of physical distress. Continue to progress workloads to maintain intensity without signs/symptoms of physical distress.   Average METs -- 1.8 1.53 1.93 2.03     Resistance Training   Training Prescription -- Yes Yes Yes Yes   Weight -- 3 lb  no overhead exercises; relayed to patient 3 lb 3 lb 3 lb   Reps -- 10-15 10-15 10-15 10-15     Interval Training   Interval Training -- -- -- No No     Oxygen   Oxygen -- Continuous Continuous -- --   Liters -- 3 3  Trach collar RA RA     NuStep   Level -- -- -- -- 1   Minutes -- -- -- -- 15   METs -- -- -- -- 2.3     Arm Ergometer   Level -- -- -- -- 1   Minutes -- -- -- -- 15   METs -- -- -- -- 2.1     REL-XR   Level -- -- -- -- 4   Minutes -- -- -- -- 15   METs -- -- -- -- 1     T5 Nustep   Level -- 1 -- 2 --   Minutes -- 30 -- 15 --   METs -- 1.8 -- 2.2 --     Biostep-RELP   Level -- -- 1 -- 1   Minutes -- -- 15 -- 15   METs -- -- 2 -- 3     Track   Laps -- -- 10 12 18    Minutes -- -- 15 15 15    METs -- -- 1.54 1.65 1.98     Oxygen   Maintain Oxygen Saturation -- 88% or higher 88% or higher 88% or higher 88% or higher    Row Name 05/10/23 0900 05/25/23 1400           Response to Exercise   Blood Pressure (Admit) 134/62 132/80      Blood Pressure (Exercise) 148/80 --      Blood Pressure (Exit) 122/62 136/78      Heart Rate (Admit) 95 bpm 90 bpm      Heart Rate (Exercise) 124 bpm 118 bpm      Heart Rate (Exit) 102 bpm 102  bpm      Oxygen Saturation (Admit) 93 % 96 %      Oxygen Saturation (Exercise) 92 % 92 %      Oxygen Saturation (Exit) 94 % 95 %      Rating  of Perceived Exertion (Exercise) 13 13      Perceived Dyspnea (Exercise) 2 2      Symptoms SOB SOB      Duration Continue with 30 min of aerobic exercise without signs/symptoms of physical distress. Continue with 30 min of aerobic exercise without signs/symptoms of physical distress.      Intensity THRR unchanged THRR unchanged        Progression   Progression Continue to progress workloads to maintain intensity without signs/symptoms of physical distress. Continue to progress workloads to maintain intensity without signs/symptoms of physical distress.      Average METs 3.26 2.24        Resistance Training   Training Prescription Yes Yes      Weight 3 lb 3 lb      Reps 10-15 10-15        Interval Training   Interval Training No No        Oxygen   Liters RA RA        NuStep   Level 5 --      Minutes 30 --      METs 4.6 --        REL-XR   Level 5 5      Minutes 15 15      METs 3.6 3        T5 Nustep   Level -- 2      Minutes -- 30      METs -- 2.2        Track   Laps 8 18      Minutes 15 15      METs 1.44 1.98        Oxygen   Maintain Oxygen Saturation 88% or higher 88% or higher               Exercise Comments:   Exercise Comments     Row Name 03/13/23 1400           Exercise Comments First full day of exercise!  Patient was oriented to gym and equipment including functions, settings, policies, and procedures.  Patient's individual exercise prescription and treatment plan were reviewed.  All starting workloads were established based on the results of the 6 minute walk test done at initial orientation visit.  The plan for exercise progression was also introduced and progression will be customized based on patient's performance and goals.                Exercise Goals and Review:   Exercise Goals     Row  Name 01/26/23 1645             Exercise Goals   Increase Physical Activity Yes       Intervention Develop an individualized exercise prescription for aerobic and resistive training based on initial evaluation findings, risk stratification, comorbidities and participant's personal goals.;Provide advice, education, support and counseling about physical activity/exercise needs.       Expected Outcomes Short Term: Attend rehab on a regular basis to increase amount of physical activity.;Long Term: Add in home exercise to make exercise part of routine and to increase amount of physical activity.;Long Term: Exercising regularly at least 3-5 days a week.       Increase Strength and Stamina Yes       Intervention Provide advice, education, support and counseling about physical activity/exercise needs.       Expected Outcomes Short Term: Perform resistance training exercises routinely during rehab and  add in resistance training at home;Long Term: Improve cardiorespiratory fitness, muscular endurance and strength as measured by increased METs and functional capacity ( );Short Term: Increase workloads from initial exercise prescription for resistance, speed, and METs.       Able to understand and use rate of perceived exertion (RPE) scale Yes       Intervention Provide education and explanation on how to use RPE scale       Expected Outcomes Short Term: Able to use RPE daily in rehab to express subjective intensity level;Long Term:  Able to use RPE to guide intensity level when exercising independently       Able to understand and use Dyspnea scale Yes       Intervention Provide education and explanation on how to use Dyspnea scale       Expected Outcomes Long Term: Able to use Dyspnea scale to guide intensity level when exercising independently;Short Term: Able to use Dyspnea scale daily in rehab to express subjective sense of shortness of breath during exertion       Knowledge and understanding of Target  Heart Rate Range (THRR) Yes       Intervention Provide education and explanation of THRR including how the numbers were predicted and where they are located for reference       Expected Outcomes Short Term: Able to state/look up THRR;Long Term: Able to use THRR to govern intensity when exercising independently;Short Term: Able to use daily as guideline for intensity in rehab       Able to check pulse independently Yes       Intervention Provide education and demonstration on how to check pulse in carotid and radial arteries.;Review the importance of being able to check your own pulse for safety during independent exercise       Expected Outcomes Long Term: Able to check pulse independently and accurately;Short Term: Able to explain why pulse checking is important during independent exercise       Understanding of Exercise Prescription Yes       Intervention Provide education, explanation, and written materials on patient's individual exercise prescription       Expected Outcomes Short Term: Able to explain program exercise prescription;Long Term: Able to explain home exercise prescription to exercise independently                Exercise Goals Re-Evaluation :  Exercise Goals Re-Evaluation     Row Name 03/13/23 1400 03/16/23 1451 03/29/23 1341 04/12/23 1329 04/27/23 1541     Exercise Goal Re-Evaluation   Exercise Goals Review Increase Physical Activity;Able to understand and use rate of perceived exertion (RPE) scale;Knowledge and understanding of Target Heart Rate Range (THRR);Understanding of Exercise Prescription;Increase Strength and Stamina;Able to check pulse independently;Able to understand and use Dyspnea scale Increase Physical Activity;Increase Strength and Stamina;Understanding of Exercise Prescription Increase Physical Activity;Increase Strength and Stamina;Understanding of Exercise Prescription Increase Physical Activity;Increase Strength and Stamina;Understanding of Exercise  Prescription Increase Physical Activity;Increase Strength and Stamina;Understanding of Exercise Prescription   Comments Reviewed RPE scale, THR and program prescription with pt today.  Pt voiced understanding and was given a copy of goals to take home. Kathy Vega is off to a good start in rehab. During her first session she was able to work at level 1 on the T5 nustep. She also did well with 3 lb hand weights for resistance training. We will continue to monitor her progress in the program. Kathy Vega completed 2 more sessions since last review, she was not able  to attend the other sessions as she had to call out. She tried out the Biostep at level 1 and was able to walk 10 laps on the track! Patient has a trach to work with during exercise and has back up supplies with her here. Her oxygen saturations are staying nicely above 88%. We will continue to monitor, Kathy Vega has only completed one session since the last review and has been inconsistent with her attendance. She did perform her exercise without oxygen during his last session and did well on room air. She was able to keep her O2 saturation above 88% as well. She also improved to level 2 on the T5 nustep and walked up to 12 laps on the track. We will continue to monitor her progress in the program. Kathy Vega is doing well in rehab. She recently increased her overall average MET level to 2.03 METs. She also has continued to do well with exercise without oxygen, and her O2 saturation has only dropped below 90% once since the last review. She walked up to 18 laps as well, and improved to level 4 on the XR. We will continue to monitor her progress in the program.   Expected Outcomes Short: Use RPE daily to regulate intensity.  Long: Follow program prescription in THR. Short: Continue to attend rehab consistently. Long: Continue to follow current exercise prescription. Short: Continue working in Engineer, manufacturing systems on track to increase laps Long: Continue to increae overall MET level  and stamina Short: Attend rehab more consistently, progressively increase workloads. Long: Continue to improve strength and stamina. Short: Continue to progressively increase workloads. Long: Continue to increase overall MET level and stamina.    Row Name 05/03/23 1427 05/10/23 0931 05/25/23 1417         Exercise Goal Re-Evaluation   Exercise Goals Review Increase Physical Activity;Increase Strength and Stamina;Understanding of Exercise Prescription Increase Physical Activity;Increase Strength and Stamina;Understanding of Exercise Prescription Increase Physical Activity;Increase Strength and Stamina;Understanding of Exercise Prescription     Comments Kathy Vega is doing well in rehab. She is exercising some on her off days from rehab by walking.  She is doing well with it; her breathing is her biggest limitation, but it is doing well.  She has noticed that her stamina is getting better with being in rehab. Kathy Vega continues to do well in rehab. She was able to improve to level 5 on both the T4 nustep and the XR. She also has continued to do well walking on the track without oxygen, as her O2 saturations have not dropped below 92%. She also increased her overall average MET level to 3.26 METs. We will continue to monitor her progress in the program. Kathy Vega is doing well in rehab. She continues to work at level 5 on the XR and level 2 on the T5 nustep. She also increased her laps on the track back up to 18 laps. We will continue to monitor her progress in the program.     Expected Outcomes Short: Continue to walk on off days Long: Continue to improve stamina Short: Continue to push for more laps on the track. Long: Continue to improve strength and stamina. Short: Continue to push for more laps on the track. Long: Continue to improve strength and stamina.              Discharge Exercise Prescription (Final Exercise Prescription Changes):  Exercise Prescription Changes - 05/25/23 1400       Response to  Exercise   Blood Pressure (Admit) 132/80  Blood Pressure (Exit) 136/78    Heart Rate (Admit) 90 bpm    Heart Rate (Exercise) 118 bpm    Heart Rate (Exit) 102 bpm    Oxygen Saturation (Admit) 96 %    Oxygen Saturation (Exercise) 92 %    Oxygen Saturation (Exit) 95 %    Rating of Perceived Exertion (Exercise) 13    Perceived Dyspnea (Exercise) 2    Symptoms SOB    Duration Continue with 30 min of aerobic exercise without signs/symptoms of physical distress.    Intensity THRR unchanged      Progression   Progression Continue to progress workloads to maintain intensity without signs/symptoms of physical distress.    Average METs 2.24      Resistance Training   Training Prescription Yes    Weight 3 lb    Reps 10-15      Interval Training   Interval Training No      Oxygen   Liters RA      REL-XR   Level 5    Minutes 15    METs 3      T5 Nustep   Level 2    Minutes 30    METs 2.2      Track   Laps 18    Minutes 15    METs 1.98      Oxygen   Maintain Oxygen Saturation 88% or higher             Nutrition:  Target Goals: Understanding of nutrition guidelines, daily intake of sodium 1500mg , cholesterol 200mg , calories 30% from fat and 7% or less from saturated fats, daily to have 5 or more servings of fruits and vegetables.  Education: All About Nutrition: -Group instruction provided by verbal, written material, interactive activities, discussions, models, and posters to present general guidelines for heart healthy nutrition including fat, fiber, MyPlate, the role of sodium in heart healthy nutrition, utilization of the nutrition label, and utilization of this knowledge for meal planning. Follow up email sent as well. Written material given at graduation.   Biometrics:  Pre Biometrics - 01/26/23 1624       Pre Biometrics   Height 5\' 9"  (1.753 m)    Weight 275 lb 4.8 oz (124.9 kg)    Waist Circumference 51 inches    Hip Circumference 52.5 inches    Waist  to Hip Ratio 0.97 %    BMI (Calculated) 40.64    Single Leg Stand 14.2 seconds              Nutrition Therapy Plan and Nutrition Goals:  Nutrition Therapy & Goals - 05/03/23 1432       Nutrition Therapy   RD appointment deferred Yes             Nutrition Assessments:  MEDIFICTS Score Key: ?70 Need to make dietary changes  40-70 Heart Healthy Diet ? 40 Therapeutic Level Cholesterol Diet  Flowsheet Row Pulmonary Rehab from 01/26/2023 in [facility]  Picture Your Plate Total Score on Admission 47      Picture Your Plate Scores: <16 Unhealthy dietary pattern with much room for improvement. 41-50 Dietary pattern unlikely to meet recommendations for good health and room for improvement. 51-60 More healthful dietary pattern, with some room for improvement.  >60 Healthy dietary pattern, although there may be some specific behaviors that could be improved.   Nutrition Goals Re-Evaluation:  Nutrition Goals Re-Evaluation     Row Name 04/10/23 1406 05/03/23 1431  05/29/23 1407         Goals   Current Weight 274 lb (124.3 kg) -- 264 lb (119.7 kg)     Nutrition Goal eat smaller portions. work on Mining engineer with RD     Comment Patient was informed on why it is important to maintain a balanced diet when dealing with Respiratory issues. Explained that it takes a lot of energy to breath and when they are short of breath often they will need to have a good diet to help keep up with the calories they are expending for breathing. Kathy Vega declined dietitian appt. Her weight is going down.  She is eating more fruit and cut back on portion sizes.  She is trying to do better with eating smaller meals. Kathy Vega would like to meet with RD     Expected Outcome Short: Choose and plan snacks accordingly to patients caloric intake to improve breathing. Long: Maintain a diet independently that meets their caloric intake to aid in daily shortness of breath. Short; Continue to add  in more fruits Long: Continue to improve diet Short: Meet with RD. Long: adhere to a diet plan that pertains to her.              Nutrition Goals Discharge (Final Nutrition Goals Re-Evaluation):  Nutrition Goals Re-Evaluation - 05/29/23 1407       Goals   Current Weight 264 lb (119.7 kg)    Nutrition Goal Meet with RD    Comment Kathy Vega would like to meet with RD    Expected Outcome Short: Meet with RD. Long: adhere to a diet plan that pertains to her.             Psychosocial: Target Goals: Acknowledge presence or absence of significant depression and/or stress, maximize coping skills, provide positive support system. Participant is able to verbalize types and ability to use techniques and skills needed for reducing stress and depression.   Education: Stress, Anxiety, and Depression - Group verbal and visual presentation to define topics covered.  Reviews how body is impacted by stress, anxiety, and depression.  Also discusses healthy ways to reduce stress and to treat/manage anxiety and depression.  Written material given at graduation.   Education: Sleep Hygiene -Provides group verbal and written instruction about how sleep can affect your health.  Define sleep hygiene, discuss sleep cycles and impact of sleep habits. Review good sleep hygiene tips.    Initial Review & Psychosocial Screening:  Initial Psych Review & Screening - 01/16/23 1217       Initial Review   Current issues with Current Stress Concerns    Source of Stress Concerns Chronic Illness    Comments Patient states that she has her kids and husband for support. Other parts of her family can be caled for support but not often. She wants to get her health back on track and breath better.      Family Dynamics   Good Support System? Yes    Comments Short: Start [facility] to help with mood. Long: Maintain a healthy mental state.      Screening Interventions   Interventions Encouraged to exercise;To provide  support and resources with identified psychosocial needs;Provide feedback about the scores to participant    Expected Outcomes Short Term goal: Utilizing psychosocial counselor, staff and physician to assist with identification of specific Stressors or current issues interfering with healing process. Setting desired goal for each stressor or current issue identified.;Long Term Goal: Stressors or current issues are  controlled or eliminated.;Short Term goal: Identification and review with participant of any Quality of Life or Depression concerns found by scoring the questionnaire.;Long Term goal: The participant improves quality of Life and PHQ9 Scores as seen by post scores and/or verbalization of changes             Quality of Life Scores:  Scores of 19 and below usually indicate a poorer quality of life in these areas.  A difference of  2-3 points is a clinically meaningful difference.  A difference of 2-3 points in the total score of the Quality of Life Index has been associated with significant improvement in overall quality of life, self-image, physical symptoms, and general health in studies assessing change in quality of life.  PHQ-9: Review Flowsheet       05/29/2023 04/10/2023 01/26/2023  Depression screen PHQ 2/9  Decreased Interest 2 2 3   Down, Depressed, Hopeless 0 0 1  PHQ - 2 Score 2 2 4   Altered sleeping 0 0 0  Tired, decreased energy 1 3 3   Change in appetite 1 0 0  Feeling bad or failure about yourself  0 0 0  Trouble concentrating 0 0 0  Moving slowly or fidgety/restless 0 0 0  Suicidal thoughts 0 0 0  PHQ-9 Score 4 5 7   Difficult doing work/chores Somewhat difficult Somewhat difficult Somewhat difficult   Interpretation of Total Score  Total Score Depression Severity:  1-4 = Minimal depression, 5-9 = Mild depression, 10-14 = Moderate depression, 15-19 = Moderately severe depression, 20-27 = Severe depression   Psychosocial Evaluation and Intervention:  Psychosocial  Evaluation - 01/16/23 1220       Psychosocial Evaluation & Interventions   Interventions Stress management education;Relaxation education;Encouraged to exercise with the program and follow exercise prescription    Comments Patient states that she has her kids and husband for support. Other parts of her family can be caled for support but not often. She wants to get her health back on track and breath better.    Expected Outcomes Short: Start [facility] to help with mood. Long: Maintain a healthy mental state.    Continue Psychosocial Services  Follow up required by staff             Psychosocial Re-Evaluation:  Psychosocial Re-Evaluation     Row Name 04/10/23 1411 05/03/23 1429 05/29/23 1417         Psychosocial Re-Evaluation   Current issues with History of Depression History of Depression History of Depression     Comments Reviewed patient health questionnaire (PHQ-9) with patient for follow up. Previously, patients score indicated signs/symptoms of depression.  Reviewed to see if patient is improving symptom wise while in program.  Score improved and patient states that it is because she has been able to control herhealth more. Kathy Vega is doing well in rehab. She is feeling better mentally and in a better place with her mood since when she was first released.  She is sleeping well. Reviewed patient health questionnaire (PHQ-9) with patient for follow up. Previously, patients score indicated signs/symptoms of depression.  Reviewed to see if patient is improving symptom wise while in program.  Score improved and patient states that it is because she has been able to have more energy.     Expected Outcomes Short: Continue to attend [facility] regularly for regular exercise and social engagement. Long: Continue to improve symptoms and manage a positive mental state. Short: Conitnue to exercise for mental boost LOng; Continue to stay  positive Short: Continue to attend [facility]  regularly for regular exercise and social engagement. Long: Continue to improve symptoms and manage a positive mental state.     Interventions Encouraged to attend Pulmonary Rehabilitation for the exercise Encouraged to attend Pulmonary Rehabilitation for the exercise Encouraged to attend Pulmonary Rehabilitation for the exercise     Continue Psychosocial Services  Follow up required by staff Follow up required by staff Follow up required by staff              Psychosocial Discharge (Final Psychosocial Re-Evaluation):  Psychosocial Re-Evaluation - 05/29/23 1417       Psychosocial Re-Evaluation   Current issues with History of Depression    Comments Reviewed patient health questionnaire (PHQ-9) with patient for follow up. Previously, patients score indicated signs/symptoms of depression.  Reviewed to see if patient is improving symptom wise while in program.  Score improved and patient states that it is because she has been able to have more energy.    Expected Outcomes Short: Continue to attend [facility] regularly for regular exercise and social engagement. Long: Continue to improve symptoms and manage a positive mental state.    Interventions Encouraged to attend Pulmonary Rehabilitation for the exercise    Continue Psychosocial Services  Follow up required by staff             Education: Education Goals: Education classes will be provided on a weekly basis, covering required topics. Participant will state understanding/return demonstration of topics presented.  Learning Barriers/Preferences:  Learning Barriers/Preferences - 01/16/23 1215       Learning Barriers/Preferences   Learning Barriers None    Learning Preferences None             General Pulmonary Education Topics:  Infection Prevention: - Provides verbal and written material to individual with discussion of infection control including proper hand washing and proper equipment cleaning during  exercise session. Flowsheet Row Pulmonary Rehab from 04/12/2023 in [facility] Card[facility] and Pulmonary Rehab  Date 01/16/23  Educator [facility]  Instruction Review Code 1- Verbalizes Understanding       Falls Prevention: - Provides verbal and written material to individual with discussion of falls prevention and safety. Flowsheet Row Pulmonary Rehab from 04/12/2023 in [facility] Cardiac and Pulmonary Rehab  Date 01/16/23  Educator [facility]  Instruction Review Code 1- Verbalizes Understanding       Chronic Lung Disease Review: - Group verbal instruction with posters, models, PowerPoint presentations and videos,  to review new updates, new respiratory medications, new advancements in procedures and treatments. Providing information on websites and "800" numbers for continued self-education. Includes information about supplement oxygen, available portable oxygen systems, continuous and intermittent flow rates, oxygen safety, concentrators, and Medicare reimbursement for oxygen. Explanation of Pulmonary Drugs, including class, frequency, complications, importance of spacers, rinsing mouth after steroid MDI's, and proper cleaning methods for nebulizers. Review of basic lung anatomy and physiology related to function, structure, and complications of lung disease. Review of risk factors. Discussion about methods for diagnosing sleep apnea and types of masks and machines for OSA. Includes a review of the use of types of environmental controls: home humidity, furnaces, filters, dust mite/pet prevention, HEPA vacuums. Discussion about weather changes, air quality and the benefits of nasal washing. Instruction on Warning signs, infection symptoms, calling MD promptly, preventive modes, and value of vaccinations. Review of effective airway clearance, coughing and/or vibration techniques. Emphasizing that all should Create an Action Plan. Written material given at graduation. Flowsheet Row Pulmonary Rehab from 04/12/2023  in [facility] Cardiac  and Pulmonary Rehab  Education need identified 01/26/23       AED/CPR: - Group verbal and written instruction with the use of models to demonstrate the basic use of the AED with the basic ABC's of resuscitation.    Anatomy and Cardiac Procedures: - Group verbal and visual presentation and models provide information about basic cardiac anatomy and function. Reviews the testing methods done to diagnose heart disease and the outcomes of the test results. Describes the treatment choices: Medical Management, Angioplasty, or Coronary Bypass Surgery for treating various heart conditions including Myocardial Infarction, Angina, Valve Disease, and Cardiac Arrhythmias.  Written material given at graduation.   Medication Safety: - Group verbal and visual instruction to review commonly prescribed medications for heart and lung disease. Reviews the medication, class of the drug, and side effects. Includes the steps to properly store meds and maintain the prescription regimen.  Written material given at graduation.   Other: -Provides group and verbal instruction on various topics (see comments)   Knowledge Questionnaire Score:  Knowledge Questionnaire Score - 01/26/23 1557       Knowledge Questionnaire Score   Pre Score 15/18              Core Components/Risk Factors/Patient Goals at Admission:  Personal Goals and Risk Factors at Admission - 01/26/23 1646       Core Components/Risk Factors/Patient Goals on Admission    Weight Management Yes;Weight Loss;Obesity    Intervention Weight Management: Develop a combined nutrition and exercise program designed to reach desired caloric intake, while maintaining appropriate intake of nutrient and fiber, sodium and fats, and appropriate energy expenditure required for the weight goal.;Weight Management: Provide education and appropriate resources to help participant work on and attain dietary goals.;Weight Management/Obesity: Establish reasonable  short term and long term weight goals.;Obesity: Provide education and appropriate resources to help participant work on and attain dietary goals.    Admit Weight 275 lb (124.7 kg)    Goal Weight: Short Term 270 lb (122.5 kg)    Goal Weight: Long Term 225 lb (102.1 kg)    Expected Outcomes Short Term: Continue to assess and modify interventions until short term weight is achieved;Long Term: Adherence to nutrition and physical activity/exercise program aimed toward attainment of established weight goal;Weight Loss: Understanding of general recommendations for a balanced deficit meal plan, which promotes 1-2 lb weight loss per week and includes a negative energy balance of [phone] kcal/d;Understanding recommendations for meals to include 15-35% energy as protein, 25-35% energy from fat, 35-60% energy from carbohydrates, less than 200mg  of dietary cholesterol, 20-35 gm of total fiber daily;Understanding of distribution of calorie intake throughout the day with the consumption of 4-5 meals/snacks    Improve shortness of breath with ADL's Yes    Intervention Provide education, individualized exercise plan and daily activity instruction to help decrease symptoms of SOB with activities of daily living.    Expected Outcomes Short Term: Improve cardiorespiratory fitness to achieve a reduction of symptoms when performing ADLs;Long Term: Be able to perform more ADLs without symptoms or delay the onset of symptoms    Increase knowledge of respiratory medications and ability to use respiratory devices properly  Yes    Intervention Provide education and demonstration as needed of appropriate use of medications, inhalers, and oxygen therapy.    Expected Outcomes Short Term: Achieves understanding of medications use. Understands that oxygen is a medication prescribed by physician. Demonstrates appropriate use of inhaler and oxygen therapy.;Long Term: Maintain  appropriate use of medications, inhalers, and oxygen therapy.              Education:Diabetes - Individual verbal and written instruction to review signs/symptoms of diabetes, desired ranges of glucose level fasting, after meals and with exercise. Acknowledge that pre and post exercise glucose checks will be done for 3 sessions at entry of program.   Know Your Numbers and Heart Failure: - Group verbal and visual instruction to discuss disease risk factors for cardiac and pulmonary disease and treatment options.  Reviews associated critical values for Overweight/Obesity, Hypertension, Cholesterol, and Diabetes.  Discusses basics of heart failure: signs/symptoms and treatments.  Introduces Heart Failure Zone chart for action plan for heart failure.  Written material given at graduation.   Core Components/Risk Factors/Patient Goals Review:   Goals and Risk Factor Review     Row Name 04/10/23 1405 05/03/23 1433 05/29/23 1409         Core Components/Risk Factors/Patient Goals Review   Personal Goals Review Improve shortness of breath with ADL's Improve shortness of breath with ADL's;Weight Management/Obesity;Increase knowledge of respiratory medications and ability to use respiratory devices properly. Weight Management/Obesity     Review Spoke to patient about their shortness of breath and what they can do to improve. Patient has been informed of breathing techniques when starting the program. Patient is informed to tell staff if they have had any med changes and that certain meds they are taking or not taking can be causing shortness of breath. Kathy Vega is doing well with her weight and continues to work on weight loss.  Her breathing is getting better and she is no longer needing oxygen for exercise.  She is still using her nebulizer and doing well with it. Kathy Vega would like to continue to lose weight. She would like to lose weight before her surgery in September.Kathy Vega is going to work on losing a few pounds in the next couple weeks.     Expected Outcomes  Short: Attend [facility] regularly to improve shortness of breath with ADL's. Long: maintain independence with ADL's Short: Continue to exercise to help with weight loss long: Continue to monitor risk factors Short: lose some weight. Long: Reach weight goal.              Core Components/Risk Factors/Patient Goals at Discharge (Final Review):   Goals and Risk Factor Review - 05/29/23 1409       Core Components/Risk Factors/Patient Goals Review   Personal Goals Review Weight Management/Obesity    Review Kathy Vega would like to continue to lose weight. She would like to lose weight before her surgery in September.Kathy Vega is going to work on losing a few pounds in the next couple weeks.    Expected Outcomes Short: lose some weight. Long: Reach weight goal.             ITP Comments:  ITP Comments     Row Name 01/16/23 1212 01/26/23 1553 02/15/23 0932 03/13/23 1359 03/15/23 1401   ITP Comments Virtual Visit completed. Patient informed on EP and RD appointment and 6 Minute walk test. Patient also informed of patient health questionnaires on My Chart. Patient Verbalizes understanding. Visit diagnosis can be found in [facility] 12/27/[facility]22. Completed and gym orientation. Initial ITP created and sent for review to Dr. Vida Rigger,  Medical Director. 30 Day review completed. Medical Director ITP review done, changes made as directed, and signed approval by Medical Director.    new to program First full day of exercise!  Patient was oriented to gym and equipment including functions, settings, policies, and procedures.  Patient's individual exercise prescription and treatment plan were reviewed.  All starting workloads were established based on the results of the 6 minute walk test done at initial orientation visit.  The plan for exercise progression was also introduced and progression will be customized based on patient's performance and goals. 30 day review completed. ITP sent to Dr. Jinny Sanders,  Medical Director of  Pulmonary Rehab. Continue with ITP unless changes are made by physician. First day of exercise was just this week, pt delayed her start.    Row Name 04/04/23 1159 04/12/23 0936 05/10/23 1129 06/06/23 1502     ITP Comments Analiyah called to let us know that she was still have significant back pain.  She went to ED for it and was given a shot and pain killers.  She has been resting and icing.  She wants to give it until Monday 5/13 to try again. 30 Day review completed. Medical Director ITP review done, changes made as directed, and signed approval by Medical Director. 30 Day review completed. Medical Director ITP review done, changes made as directed, and signed approval by Medical Director. 30 Day review completed. Medical Director ITP review done, changes made as directed, and signed approval by Medical Director.             Comments:

## 2023-06-07 ENCOUNTER — Encounter: Payer: 59 | Admitting: *Deleted

## 2023-06-07 DIAGNOSIS — J4540 Moderate persistent asthma, uncomplicated: Secondary | ICD-10-CM | POA: Diagnosis not present

## 2023-06-07 NOTE — Progress Notes (Signed)
Daily Session Note  Patient Details  Name: Kathy Vega MRN: [id number] Date of Birth: 05-22-1976 Referring Provider:   Flowsheet Row Pulmonary Rehab from 01/26/2023 in [facility] Cardiac and Pulmonary Rehab  Referring Provider Sarina Ser MD       Encounter Date: 06/07/2023  Check In:  Session Check In - 06/07/23 1436       Check-In   Supervising physician immediately available to respond to emergencies See telemetry face sheet for immediately available ER MD    Location [facility]-Cardiac & Pulmonary Rehab    Staff Present Hulen Luster, BS, RRT, CPFT;Kahmari Herard Jewel Baize, RN BSN;Joseph Reino Kent, RCP,RRT,BSRT    Virtual Visit No    Medication changes reported     No    Fall or balance concerns reported    No    Warm-up and Cool-down Performed on first and last piece of equipment    Resistance Training Performed Yes    VAD Patient? No    PAD/SET Patient? No      Pain Assessment   Currently in Pain? No/denies                Social History   Tobacco Use  Smoking Status Former   Packs/day: 0.50   Years: 5.00   Additional pack years: 0.00   Total pack years: 2.50   Types: Cigarettes   Quit date: 2000   Years since quitting: 24.5  Smokeless Tobacco Never    Goals Met:  Independence with exercise equipment Exercise tolerated well No report of concerns or symptoms today Strength training completed today  Goals Unmet:  Not Applicable  Comments: Pt able to follow exercise prescription today without complaint.  Will continue to monitor for progression.    Dr. Bethann Punches is Medical Director for [facility] Cardiac Rehabilitation.  Dr. Vida Rigger is Medical Director for [facility] Pulmonary Rehabilitation.

## 2023-06-14 ENCOUNTER — Encounter: Payer: 59 | Admitting: *Deleted

## 2023-06-14 DIAGNOSIS — J4540 Moderate persistent asthma, uncomplicated: Secondary | ICD-10-CM

## 2023-06-14 NOTE — Progress Notes (Signed)
Daily Session Note  Patient Details  Name: QUETZALI HEINLE MRN: [id number] Date of Birth: 01-26-1976 Referring Provider:   Flowsheet Row Pulmonary Rehab from 01/26/2023 in [facility] Cardiac and Pulmonary Rehab  Referring Provider Sarina Ser MD       Encounter Date: 06/14/2023  Check In:  Session Check In - 06/14/23 1413       Check-In   Supervising physician immediately available to respond to emergencies See telemetry face sheet for immediately available ER MD    Location [facility]-Cardiac & Pulmonary Rehab    Staff Present Tommye Standard, BS, ACSM CEP, Exercise Physiologist;Laureen Manson Passey, BS, RRT, CPFT;Kayveon Lennartz Jewel Baize, RN BSN    Virtual Visit No    Medication changes reported     No    Fall or balance concerns reported    No    Warm-up and Cool-down Performed on first and last piece of equipment    Resistance Training Performed Yes    VAD Patient? No    PAD/SET Patient? No      Pain Assessment   Currently in Pain? No/denies                Social History   Tobacco Use  Smoking Status Former   Current packs/day: 0.00   Average packs/day: 0.5 packs/day for 5.0 years (2.5 ttl pk-yrs)   Types: Cigarettes   Start date: 64   Quit date: 2000   Years since quitting: 24.5  Smokeless Tobacco Never    Goals Met:  Independence with exercise equipment Exercise tolerated well No report of concerns or symptoms today Strength training completed today  Goals Unmet:  Not Applicable  Comments: Pt able to follow exercise prescription today without complaint.  Will continue to monitor for progression.    Dr. Bethann Punches is Medical Director for [facility] Cardiac Rehabilitation.  Dr. Vida Rigger is Medical Director for [facility] Pulmonary Rehabilitation.

## 2023-06-19 ENCOUNTER — Other Ambulatory Visit: Payer: Self-pay | Admitting: Physician Assistant

## 2023-06-19 ENCOUNTER — Encounter: Payer: 59 | Admitting: *Deleted

## 2023-06-19 DIAGNOSIS — J4540 Moderate persistent asthma, uncomplicated: Secondary | ICD-10-CM

## 2023-06-19 DIAGNOSIS — Z1231 Encounter for screening mammogram for malignant neoplasm of breast: Secondary | ICD-10-CM

## 2023-06-19 NOTE — Progress Notes (Signed)
Daily Session Note  Patient Details  Name: Kathy Vega MRN: [id number] Date of Birth: Dec 16, 1975 Referring Provider:   Flowsheet Row Pulmonary Rehab from 01/26/2023 in [facility] Cardiac and Pulmonary Rehab  Referring Provider Sarina Ser MD       Encounter Date: 06/19/2023  Check In:  Session Check In - 06/19/23 1408       Check-In   Supervising physician immediately available to respond to emergencies See telemetry face sheet for immediately available ER MD    Location [facility]-Cardiac & Pulmonary Rehab    Staff Present Lanny Hurst, RN, ADN;Laureen Manson Passey, BS, RRT, CPFT;Catalea Labrecque Jewel Baize, RN BSN    Virtual Visit No    Medication changes reported     No    Fall or balance concerns reported    No    Warm-up and Cool-down Performed on first and last piece of equipment    Resistance Training Performed Yes    VAD Patient? No    PAD/SET Patient? No      Pain Assessment   Currently in Pain? No/denies                Social History   Tobacco Use  Smoking Status Former   Current packs/day: 0.00   Average packs/day: 0.5 packs/day for 5.0 years (2.5 ttl pk-yrs)   Types: Cigarettes   Start date: 27   Quit date: 2000   Years since quitting: 24.5  Smokeless Tobacco Never    Goals Met:  Independence with exercise equipment Exercise tolerated well No report of concerns or symptoms today Strength training completed today  Goals Unmet:  Not Applicable  Comments: Pt able to follow exercise prescription today without complaint.  Will continue to monitor for progression.    Dr. Bethann Punches is Medical Director for [facility] Cardiac Rehabilitation.  Dr. Vida Rigger is Medical Director for [facility] Pulmonary Rehabilitation.

## 2023-06-20 ENCOUNTER — Ambulatory Visit: Payer: Medicaid Other | Admitting: Obstetrics and Gynecology

## 2023-06-20 ENCOUNTER — Other Ambulatory Visit: Payer: Self-pay

## 2023-06-20 DIAGNOSIS — D219 Benign neoplasm of connective and other soft tissue, unspecified: Secondary | ICD-10-CM

## 2023-06-20 DIAGNOSIS — N920 Excessive and frequent menstruation with regular cycle: Secondary | ICD-10-CM

## 2023-06-20 MED ORDER — NORETHINDRONE ACETATE 5 MG PO TABS: 10.0000 mg | ORAL_TABLET | Freq: Every day | ORAL | 1 refills | Status: DC

## 2023-06-20 NOTE — Telephone Encounter (Signed)
Pt calling; is trying to get her myfembree refilled at [facility]; they told her she would need to call us and have [place] to send rx there.  [phone]  Left detailed msg that her refill was sent to [facility].

## 2023-06-21 ENCOUNTER — Encounter: Payer: 59 | Admitting: *Deleted

## 2023-06-21 DIAGNOSIS — J4540 Moderate persistent asthma, uncomplicated: Secondary | ICD-10-CM | POA: Diagnosis not present

## 2023-06-21 NOTE — Progress Notes (Signed)
Assessment start time: 3:00 PM  Accessibility to food no concerns Digestive issues/concerns no known food allergies, no digestive concerns  24-hours Recall: B: nothing, hot tea L: 3 slices pizza  D: nothing  Beverages hot tea, vitamin water, little bit of soda Alcohol none  Supplements: Iron Intake Patterns Snack on nuts, fruit, or chips sometimes, but not a lot Pt eats away from home 2 times a week ([employer], [employer]). Patient does grocery shopping. Patient does cooking.  Where do you grocery shop? Food Education officer, community r/t nutrition plan She is drinking mostly vitamin water, some soda. Talked to her about ensuring she keep soda to a limit and meet a goal of ~64oz of water daily. She doesn't feel hungry most of the day. Will miss breakfast almost every day, dinner most days. Lunch is not always big in size but can at times be high calorie foods like pizza, fried chicken, ect. Educated on the Mediterranean diet and ways to eat more healthy foods, reduce bad fats, refined sugars, and sodium. But main focus is on getting enough nutrition with her small appetite. Build out several small meals and snacks centered around nutrient and calorie dense foods to help her meet her nutrition goals with as few bites as needed.   Goal 1: Eat 3 times per day (carbs paired with protein) Goal 2: Limit fried foods  End time 3:37 PM

## 2023-06-21 NOTE — Progress Notes (Signed)
Daily Session Note  Patient Details  Name: Kathy Vega MRN: [id number] Date of Birth: 23-May-1976 Referring Provider:   Flowsheet Row Pulmonary Rehab from 01/26/2023 in [facility] Cardiac and Pulmonary Rehab  Referring Provider Sarina Ser MD       Encounter Date: 06/21/2023  Check In:  Session Check In - 06/21/23 1412       Check-In   Supervising physician immediately available to respond to emergencies See telemetry face sheet for immediately available ER MD    Location [facility]-Cardiac & Pulmonary Rehab    Staff Present Susann Givens, RN Atilano Median, RN, ADN;Other   Maggie Best and Max Conetta   Virtual Visit No    Medication changes reported     No    Fall or balance concerns reported    No    Warm-up and Cool-down Performed on first and last piece of equipment    Resistance Training Performed Yes    VAD Patient? No    PAD/SET Patient? No      Pain Assessment   Currently in Pain? No/denies                Social History   Tobacco Use  Smoking Status Former   Current packs/day: 0.00   Average packs/day: 0.5 packs/day for 5.0 years (2.5 ttl pk-yrs)   Types: Cigarettes   Start date: 27   Quit date: 2000   Years since quitting: 24.5  Smokeless Tobacco Never    Goals Met:  Independence with exercise equipment Exercise tolerated well No report of concerns or symptoms today Strength training completed today  Goals Unmet:  Not Applicable  Comments: Pt able to follow exercise prescription today without complaint.  Will continue to monitor for progression.    Dr. Bethann Punches is Medical Director for [facility] Cardiac Rehabilitation.  Dr. Vida Rigger is Medical Director for [facility] Pulmonary Rehabilitation.

## 2023-07-03 ENCOUNTER — Encounter: Payer: 59 | Attending: Pulmonary Disease | Admitting: *Deleted

## 2023-07-03 DIAGNOSIS — J4540 Moderate persistent asthma, uncomplicated: Secondary | ICD-10-CM | POA: Diagnosis present

## 2023-07-03 NOTE — Progress Notes (Signed)
Daily Session Note  Patient Details  Name: KATELINA STADTLER MRN: [id number] Date of Birth: 1976-05-09 Referring Provider:   Flowsheet Row Pulmonary Rehab from 01/26/2023 in [facility] Cardiac and Pulmonary Rehab  Referring Provider Sarina Ser MD       Encounter Date: 07/03/2023  Check In:  Session Check In - 07/03/23 1412       Check-In   Supervising physician immediately available to respond to emergencies See telemetry face sheet for immediately available ER MD    Location [facility]-Cardiac & Pulmonary Rehab    Staff Present Susann Givens, RN BSN;Joseph Reino Kent, Guinevere Ferrari, RN, [place]    Virtual Visit No    Medication changes reported     No    Fall or balance concerns reported    No    Warm-up and Cool-down Performed on first and last piece of equipment    Resistance Training Performed Yes    VAD Patient? No    PAD/SET Patient? No      Pain Assessment   Currently in Pain? No/denies                Social History   Tobacco Use  Smoking Status Former   Current packs/day: 0.00   Average packs/day: 0.5 packs/day for 5.0 years (2.5 ttl pk-yrs)   Types: Cigarettes   Start date: 65   Quit date: 2000   Years since quitting: 24.6  Smokeless Tobacco Never    Goals Met:  Independence with exercise equipment Exercise tolerated well No report of concerns or symptoms today Strength training completed today  Goals Unmet:  Not Applicable  Comments: Pt able to follow exercise prescription today without complaint.  Will continue to monitor for progression.    Dr. Bethann Punches is Medical Director for [facility] Cardiac Rehabilitation.  Dr. Vida Rigger is Medical Director for [facility] Pulmonary Rehabilitation.

## 2023-07-05 ENCOUNTER — Encounter: Payer: Self-pay | Admitting: *Deleted

## 2023-07-05 DIAGNOSIS — J4540 Moderate persistent asthma, uncomplicated: Secondary | ICD-10-CM

## 2023-07-05 NOTE — Progress Notes (Signed)
Pulmonary Individual Treatment Plan  Patient Details  Name: KETTY SPIZZIRRI MRN: [id number] Date of Birth: 1976/06/12 Referring Provider:   Flowsheet Row Pulmonary Rehab from 01/26/2023 in [facility] Cardiac and Pulmonary Rehab  Referring Provider Sarina Ser MD       Initial Encounter Date:  Flowsheet Row Pulmonary Rehab from 01/26/2023 in [facility] Cardiac and Pulmonary Rehab  Date 01/26/23       Visit Diagnosis: Moderate persistent asthma in adult without complication  Patient's Home Medications on Admission:  Current Outpatient Medications:    acetaminophen (TYLENOL) 500 MG tablet, Take 500 mg by mouth every 6 (six) hours as needed for headache, fever, moderate pain or mild pain., Disp: , Rfl:    albuterol (PROVENTIL) (2.5 MG/3ML) 0.083% nebulizer solution, Take 3 mLs (2.5 mg total) by nebulization every 6 (six) hours as needed for wheezing or shortness of breath., Disp: 360 mL, Rfl: 11   ascorbic acid (VITAMIN C) 500 MG tablet, Take 1 tablet (500 mg total) by mouth daily., Disp: 30 tablet, Rfl: 0   bisoprolol (ZEBETA) 5 MG tablet, Take 1 tablet (5 mg total) by mouth daily., Disp: 30 tablet, Rfl: 6   budesonide (PULMICORT) 0.5 MG/2ML nebulizer solution, Take 2 mLs (0.5 mg total) by nebulization 2 (two) times daily., Disp: 120 mL, Rfl: 5   formoterol (PERFOROMIST) 20 MCG/2ML nebulizer solution, Take 2 mLs (20 mcg total) by nebulization 2 (two) times daily. Use before the budesonide, twice a day., Disp: 120 mL, Rfl: 11   furosemide (LASIX) 20 MG tablet, Take 1 tablet (20 mg total) by mouth daily as needed for edema (shortness of breath)., Disp: 90 tablet, Rfl: 3   HYDROcodone-acetaminophen (NORCO/VICODIN) 5-325 MG tablet, Take 1 tablet by mouth every 4 (four) hours as needed for moderate pain., Disp: 12 tablet, Rfl: 0   hydrOXYzine (ATARAX) 10 MG tablet, Take 10 mg by mouth 3 (three) times daily., Disp: , Rfl:    iron polysaccharides (NIFEREX) 150 MG capsule, Take 1 capsule (150 mg total) by  mouth daily., Disp: 30 capsule, Rfl: 1   levothyroxine (SYNTHROID) 125 MCG tablet, Take 250 mcg by mouth daily., Disp: , Rfl:    meloxicam (MOBIC) 15 MG tablet, Take 1 tablet (15 mg total) by mouth daily., Disp: 30 tablet, Rfl: 0   methocarbamol (ROBAXIN) 500 MG tablet, Take 1 tablet (500 mg total) by mouth 4 (four) times daily., Disp: 30 tablet, Rfl: 0   norethindrone (AYGESTIN) 5 MG tablet, Take 2 tablets (10 mg total) by mouth daily., Disp: 30 tablet, Rfl: 1   nystatin cream (MYCOSTATIN), Apply topically 2 (two) times daily., Disp: 30 g, Rfl: 0   OXYGEN, Inhale into the lungs at bedtime as needed. Up to 4 liters, Disp: , Rfl:    pantoprazole (PROTONIX) 40 MG tablet, Take 1 tablet (40 mg total) by mouth daily., Disp: 30 tablet, Rfl: 2   propranolol (INDERAL) 10 MG tablet, Take 1 tablet (10 mg total) by mouth 3 (three) times daily as needed (palpitations)., Disp: 90 tablet, Rfl: 6  Past Medical History: Past Medical History:  Diagnosis Date   Acute encephalopathy 08/13/2020   Acute renal failure (ARF) (HCC) 08/13/2020   Anemia    Asthma    Cervical cancer (HCC)    Hypothyroidism    Iron deficiency anemia    Obesity (BMI 30-39.9)    Pneumonia due to COVID-19 virus 08/13/2020   requiring trach and prolonged hospitalization   Subglottic stenosis    Tracheal stenosis    Tracheostomy  tube present (HCC)     Tobacco Use: Social History   Tobacco Use  Smoking Status Former   Current packs/day: 0.00   Average packs/day: 0.5 packs/day for 5.0 years (2.5 ttl pk-yrs)   Types: Cigarettes   Start date: 22   Quit date: 2000   Years since quitting: 24.6  Smokeless Tobacco Never    Labs: Review Flowsheet  More data exists      Latest Ref Rng & Units 08/16/2020 08/17/2020 08/18/2020 08/20/2020 08/31/2020  Labs for ITP Cardiac and Pulmonary Rehab  Trlycerides <150 mg/dL - 1,610  - - -  PH, Arterial 7.350 - 7.450 7.40  - 7.41  7.38  7.42  7.47   PCO2 arterial 32.0 - 48.0 mmHg 44  - 49  56   48  53   Bicarbonate 20.0 - 28.0 mmol/L 27.3  - 31.1  33.1  31.1  38.6   O2 Saturation % 98.8  - 92.3  94.6  93.1  92.5     Details       Multiple values from one day are sorted in reverse-chronological order          Pulmonary Assessment Scores:  Pulmonary Assessment Scores     Row Name 01/26/23 1601         ADL UCSD   ADL Phase Entry     SOB Score total 100     Rest 3     Walk 4     Stairs 5     Bath 3     Dress 5     Shop 5       CAT Score   CAT Score 25       mMRC Score   mMRC Score 4              UCSD: Self-administered rating of dyspnea associated with activities of daily living (ADLs) 6-point scale (0 = "not at all" to 5 = "maximal or unable to do because of breathlessness")  Scoring Scores range from 0 to 120.  Minimally important difference is 5 units  CAT: CAT can identify the health impairment of COPD patients and is better correlated with disease progression.  CAT has a scoring range of zero to 40. The CAT score is classified into four groups of low (less than 10), medium (10 - 20), high (21-30) and very high (31-40) based on the impact level of disease on health status. A CAT score over 10 suggests significant symptoms.  A worsening CAT score could be explained by an exacerbation, poor medication adherence, poor inhaler technique, or progression of COPD or comorbid conditions.  CAT MCID is 2 points  mMRC: mMRC (Modified Medical Research Council) Dyspnea Scale is used to assess the degree of baseline functional disability in patients of respiratory disease due to dyspnea. No minimal important difference is established. A decrease in score of 1 point or greater is considered a positive change.   Pulmonary Function Assessment:  Pulmonary Function Assessment - 01/16/23 1215       Breath   Shortness of Breath Yes;Limiting activity             Exercise Target Goals: Exercise Program Goal: Individual exercise prescription set using results  from initial 6 min walk test and THRR while considering  patient's activity barriers and safety.   Exercise Prescription Goal: Initial exercise prescription builds to 30-45 minutes a day of aerobic activity, 2-3 days per week.  Home exercise guidelines will be given to  patient during program as part of exercise prescription that the participant will acknowledge.  Education: Aerobic Exercise: - Group verbal and visual presentation on the components of exercise prescription. Introduces F.I.T.T principle from ACSM for exercise prescriptions.  Reviews F.I.T.T. principles of aerobic exercise including progression. Written material given at graduation.   Education: Resistance Exercise: - Group verbal and visual presentation on the components of exercise prescription. Introduces F.I.T.T principle from ACSM for exercise prescriptions  Reviews F.I.T.T. principles of resistance exercise including progression. Written material given at graduation.    Education: Exercise & Equipment Safety: - Individual verbal instruction and demonstration of equipment use and safety with use of the equipment. Flowsheet Row Pulmonary Rehab from 04/12/2023 in [facility] Cardiac and Pulmonary Rehab  Date 01/16/23  Educator [facility]  Instruction Review Code 1- Verbalizes Understanding       Education: Exercise Physiology & General Exercise Guidelines: - Group verbal and written instruction with models to review the exercise physiology of the cardiovascular system and associated critical values. Provides general exercise guidelines with specific guidelines to those with heart or lung disease.    Education: Flexibility, Balance, Mind/Body Relaxation: - Group verbal and visual presentation with interactive activity on the components of exercise prescription. Introduces F.I.T.T principle from ACSM for exercise prescriptions. Reviews F.I.T.T. principles of flexibility and balance exercise training including progression. Also discusses  the mind body connection.  Reviews various relaxation techniques to help reduce and manage stress (i.e. Deep breathing, progressive muscle relaxation, and visualization). Balance handout provided to take home. Written material given at graduation.   Activity Barriers & Risk Stratification:  Activity Barriers & Cardiac Risk Stratification - 01/26/23 1617       Activity Barriers & Cardiac Risk Stratification   Activity Barriers Back Problems;Deconditioning;Muscular Weakness;Shortness of Breath;Other (comment)    Comments chronic back pain, bilateral knee pain, tracheostomy in place             6 Minute Walk:  6 Minute Walk     Row Name 01/26/23 1626         6 Minute Walk   Phase Initial     Distance 395 feet     Walk Time 5.1 minutes     # of Rest Breaks 5  0.52-1:01; 1:32-1:43; 2:28-2:41; 3:37-3:43; 4:44-4:57     MPH 0.88     METS 1.94     RPE 13     Perceived Dyspnea  3     VO2 Peak 6.8     Symptoms Yes (comment)     Comments Back pain 7/10, SOB, fatigue     Resting HR 90 bpm     Resting BP 118/74     Resting Oxygen Saturation  99 %     Exercise Oxygen Saturation  during 6 min walk 94 %     Max Ex. HR 114 bpm     Max Ex. BP 132/80     2 Minute Post BP 120/76       Interval HR   1 Minute HR 101     2 Minute HR 110     3 Minute HR 112     4 Minute HR 108     5 Minute HR 114     6 Minute HR 111     2 Minute Post HR 87     Interval Heart Rate? Yes       Interval Oxygen   Interval Oxygen? Yes     Baseline Oxygen Saturation % 99 %  1 Minute Oxygen Saturation % 94 %     1 Minute Liters of Oxygen 4 L  trach collar; 28% Fio2     2 Minute Oxygen Saturation % 98 %     2 Minute Liters of Oxygen 4 L     3 Minute Oxygen Saturation % 99 %     3 Minute Liters of Oxygen 4 L     4 Minute Oxygen Saturation % 99 %     4 Minute Liters of Oxygen 4 L     5 Minute Oxygen Saturation % 97 %     5 Minute Liters of Oxygen 4 L     6 Minute Oxygen Saturation % 96 %     6  Minute Liters of Oxygen 4 L     2 Minute Post Oxygen Saturation % 98 %     2 Minute Post Liters of Oxygen 4 L             Oxygen Initial Assessment:  Oxygen Initial Assessment - 01/26/23 1557       Home Oxygen   Home Oxygen Device E-Tanks;Home Concentrator    Sleep Oxygen Prescription Continuous   Trach Collar   Liters per minute 5    Home Exercise Oxygen Prescription Continuous   28% Fio2- trach collar   Liters per minute 4   28% Fio2 - trach collar   Home Resting Oxygen Prescription Continuous   28% Fio2- trach collar   Liters per minute 4    Compliance with Home Oxygen Use Yes      Initial 6 min Walk   Oxygen Used Continuous    Liters per minute 4    FiO2% 28   trach collar     Program Oxygen Prescription   Program Oxygen Prescription Continuous    Liters per minute 4    FiO2% 28   trach collar   Comments RT met with patient and reviewed information with patient.      Intervention   Short Term Goals To learn and exhibit compliance with exercise, home and travel O2 prescription;To learn and understand importance of monitoring SPO2 with pulse oximeter and demonstrate accurate use of the pulse oximeter.;To learn and demonstrate proper pursed lip breathing techniques or other breathing techniques. ;To learn and understand importance of maintaining oxygen saturations>88%;To learn and demonstrate proper use of respiratory medications    Long  Term Goals Exhibits compliance with exercise, home  and travel O2 prescription;Maintenance of O2 saturations>88%;Compliance with respiratory medication;Demonstrates proper use of MDI's;Exhibits proper breathing techniques, such as pursed lip breathing or other method taught during program session;Verbalizes importance of monitoring SPO2 with pulse oximeter and return demonstration             Oxygen Re-Evaluation:  Oxygen Re-Evaluation     Row Name 03/13/23 1400 04/10/23 1403 05/03/23 1435 05/29/23 1412       Program Oxygen  Prescription   Program Oxygen Prescription -- Continuous None None    Liters per minute -- 4 -- --    Comments -- patient wanted to try room air for exercise today. now staying on room air and saturation are staying above 90% --      Home Oxygen   Home Oxygen Device -- E-Tanks;Home Concentrator None;Home Concentrator  not needing currently None;Home Concentrator    Sleep Oxygen Prescription -- Continuous None None    Liters per minute -- 5 -- --    Home Exercise Oxygen Prescription -- -- None None  Liters per minute -- 4 -- --    Home Resting Oxygen Prescription -- Continuous None None    Liters per minute -- 4 -- --    Compliance with Home Oxygen Use -- Yes Yes --      Goals/Expected Outcomes   Short Term Goals To learn and exhibit compliance with exercise, home and travel O2 prescription;To learn and understand importance of monitoring SPO2 with pulse oximeter and demonstrate accurate use of the pulse oximeter.;To learn and demonstrate proper pursed lip breathing techniques or other breathing techniques. ;To learn and understand importance of maintaining oxygen saturations>88%;To learn and demonstrate proper use of respiratory medications To learn and demonstrate proper pursed lip breathing techniques or other breathing techniques.  To learn and exhibit compliance with exercise, home and travel O2 prescription;To learn and understand importance of monitoring SPO2 with pulse oximeter and demonstrate accurate use of the pulse oximeter.;To learn and understand importance of maintaining oxygen saturations>88%;To learn and demonstrate proper pursed lip breathing techniques or other breathing techniques.  To learn and understand importance of monitoring SPO2 with pulse oximeter and demonstrate accurate use of the pulse oximeter.;To learn and understand importance of maintaining oxygen saturations>88%    Long  Term Goals Exhibits compliance with exercise, home  and travel O2 prescription;Maintenance of  O2 saturations>88%;Compliance with respiratory medication;Demonstrates proper use of MDI's;Exhibits proper breathing techniques, such as pursed lip breathing or other method taught during program session;Verbalizes importance of monitoring SPO2 with pulse oximeter and return demonstration Exhibits proper breathing techniques, such as pursed lip breathing or other method taught during program session Exhibits compliance with exercise, home  and travel O2 prescription;Verbalizes importance of monitoring SPO2 with pulse oximeter and return demonstration;Maintenance of O2 saturations>88%;Compliance with respiratory medication;Exhibits proper breathing techniques, such as pursed lip breathing or other method taught during program session Verbalizes importance of monitoring SPO2 with pulse oximeter and return demonstration;Maintenance of O2 saturations>88%    Comments Reviewed PLB technique with pt.  Talked about how it works and it's importance in maintaining their exercise saturations. Diaphragmatic and PLB breathing explained and performed with patient. Patient has a better understanding of how to do these exercises to help with breathing performance and relaxation. Patient performed breathing techniques adequately and to practice further at home. Patient would like to titrate her oxygen today and see how her oxygen saturations are while exercising. Lutie is doing well in rehab.  She is no longer requiring oxygen therapy to maintain her saturations.  She continues to check them and uses her nebulizer routinely at home.  She is good about using her PLB as well.  She wants to ask about getting her trach out and plans to ask her doctor about it at end of month.  She is worried about closing off if it comes out, but really doesn't want it the rest of her life. She/has a pulse oximeter to check her oxygen saturation at home. Informed and explained why it is important to have one. Reviewed that oxygen saturations should be  88 percent and above. Patient verbalizes understanding.    Goals/Expected Outcomes Short: Become more profiecient at using PLB.   Long: Become independent at using PLB. Short: practice PLB and diaphragmatic breathing at home. Long: Use PLB, titrate oxygen and diaphragmatic breathing independently post [employer]. Short: Continue to monitor saturations Long: Talk to doctor about trach removal. Short: monitor oxygen at home with exertion. Long: maintain oxygen saturations above 88 percent independently.  Oxygen Discharge (Final Oxygen Re-Evaluation):  Oxygen Re-Evaluation - 05/29/23 1412       Program Oxygen Prescription   Program Oxygen Prescription None      Home Oxygen   Home Oxygen Device None;Home Concentrator    Sleep Oxygen Prescription None    Home Exercise Oxygen Prescription None    Home Resting Oxygen Prescription None      Goals/Expected Outcomes   Short Term Goals To learn and understand importance of monitoring SPO2 with pulse oximeter and demonstrate accurate use of the pulse oximeter.;To learn and understand importance of maintaining oxygen saturations>88%    Long  Term Goals Verbalizes importance of monitoring SPO2 with pulse oximeter and return demonstration;Maintenance of O2 saturations>88%    Comments She/has a pulse oximeter to check her oxygen saturation at home. Informed and explained why it is important to have one. Reviewed that oxygen saturations should be 88 percent and above. Patient verbalizes understanding.    Goals/Expected Outcomes Short: monitor oxygen at home with exertion. Long: maintain oxygen saturations above 88 percent independently.             Initial Exercise Prescription:  Initial Exercise Prescription - 01/26/23 1600       Date of Initial Exercise RX and Referring Provider   Date 01/26/23    Referring Provider Sarina Ser MD      Oxygen   Oxygen Continuous    Liters 4   Trach collar; 28% Fio2   Maintain Oxygen  Saturation 88% or higher      Recumbant Bike   Level 1    RPM 50    Watts 15    Minutes 15    METs 1.9      T5 Nustep   Level 1    SPM 80    Minutes 15    METs 1.9      Biostep-RELP   Level 1    SPM 50    Minutes 15    METs 1.9      Track   Laps 4   Breaks as needed   Minutes 15    METs 1.1      Prescription Details   Frequency (times per week) 2    Duration Progress to 30 minutes of continuous aerobic without signs/symptoms of physical distress      Intensity   THRR 40-80% of Max Heartrate 123- 156    Ratings of Perceived Exertion 11-13    Perceived Dyspnea 0-4      Progression   Progression Continue to progress workloads to maintain intensity without signs/symptoms of physical distress.      Resistance Training   Training Prescription Yes    Weight 3 lb   no overhead exercises; relayed to patient   Reps 10-15             Perform Capillary Blood Glucose checks as needed.  Exercise Prescription Changes:   Exercise Prescription Changes     Row Name 01/26/23 1600 03/16/23 1400 03/29/23 1300 04/12/23 1300 04/27/23 1500     Response to Exercise   Blood Pressure (Admit) 118/74 132/80 136/82 148/80 110/72   Blood Pressure (Exercise) 132/80 158/70 138/70 128/70 144/76   Blood Pressure (Exit) 120/76 130/80 140/70 112/72 104/62   Heart Rate (Admit) 90 bpm 114 bpm 102 bpm 90 bpm 95 bpm   Heart Rate (Exercise) 114 bpm 110 bpm 134 bpm 128 bpm 128 bpm   Heart Rate (Exit) 87 bpm 106 bpm 109 bpm 105 bpm 105  bpm   Oxygen Saturation (Admit) 99 % 92 % 98 % 92 % 94 %   Oxygen Saturation (Exercise) 94 % 96 % 95 % 88 % 89 %   Oxygen Saturation (Exit) 98 % 97 % 97 % 94 % 96 %   Rating of Perceived Exertion (Exercise) 13 11 13 13 16    Perceived Dyspnea (Exercise) 3 1 3 1 3    Symptoms SOB, fatigue, back pain 7/10 SOB SOB SOB SOB   Comments walk test results First full day of exercise -- -- --   Duration -- Progress to 30 minutes of  aerobic without signs/symptoms of  physical distress Progress to 30 minutes of  aerobic without signs/symptoms of physical distress Progress to 30 minutes of  aerobic without signs/symptoms of physical distress Continue with 30 min of aerobic exercise without signs/symptoms of physical distress.   Intensity -- THRR unchanged THRR unchanged THRR unchanged THRR unchanged     Progression   Progression -- Continue to progress workloads to maintain intensity without signs/symptoms of physical distress. Continue to progress workloads to maintain intensity without signs/symptoms of physical distress. Continue to progress workloads to maintain intensity without signs/symptoms of physical distress. Continue to progress workloads to maintain intensity without signs/symptoms of physical distress.   Average METs -- 1.8 1.53 1.93 2.03     Resistance Training   Training Prescription -- Yes Yes Yes Yes   Weight -- 3 lb  no overhead exercises; relayed to patient 3 lb 3 lb 3 lb   Reps -- 10-15 10-15 10-15 10-15     Interval Training   Interval Training -- -- -- No No     Oxygen   Oxygen -- Continuous Continuous -- --   Liters -- 3 3  Trach collar RA RA     NuStep   Level -- -- -- -- 1   Minutes -- -- -- -- 15   METs -- -- -- -- 2.3     Arm Ergometer   Level -- -- -- -- 1   Minutes -- -- -- -- 15   METs -- -- -- -- 2.1     REL-XR   Level -- -- -- -- 4   Minutes -- -- -- -- 15   METs -- -- -- -- 1     T5 Nustep   Level -- 1 -- 2 --   Minutes -- 30 -- 15 --   METs -- 1.8 -- 2.2 --     Biostep-RELP   Level -- -- 1 -- 1   Minutes -- -- 15 -- 15   METs -- -- 2 -- 3     Track   Laps -- -- 10 12 18    Minutes -- -- 15 15 15    METs -- -- 1.54 1.65 1.98     Oxygen   Maintain Oxygen Saturation -- 88% or higher 88% or higher 88% or higher 88% or higher    Row Name 05/10/23 0900 05/25/23 1400 06/10/23 1100 07/04/23 1500       Response to Exercise   Blood Pressure (Admit) 134/62 132/80 128/80 146/90    Blood Pressure  (Exercise) 148/80 -- -- --    Blood Pressure (Exit) 122/62 136/78 122/78 146/98    Heart Rate (Admit) 95 bpm 90 bpm 92 bpm 93 bpm    Heart Rate (Exercise) 124 bpm 118 bpm 115 bpm 122 bpm    Heart Rate (Exit) 102 bpm 102 bpm 106 bpm 124 bpm  Oxygen Saturation (Admit) 93 % 96 % 97 % 99 %    Oxygen Saturation (Exercise) 92 % 92 % 95 % 91 %    Oxygen Saturation (Exit) 94 % 95 % 95 % 94 %    Rating of Perceived Exertion (Exercise) 13 13 13 13     Perceived Dyspnea (Exercise) 2 2 2 2     Symptoms SOB SOB SOB SOB    Duration Continue with 30 min of aerobic exercise without signs/symptoms of physical distress. Continue with 30 min of aerobic exercise without signs/symptoms of physical distress. Continue with 30 min of aerobic exercise without signs/symptoms of physical distress. Continue with 30 min of aerobic exercise without signs/symptoms of physical distress.    Intensity THRR unchanged THRR unchanged THRR unchanged THRR unchanged      Progression   Progression Continue to progress workloads to maintain intensity without signs/symptoms of physical distress. Continue to progress workloads to maintain intensity without signs/symptoms of physical distress. Continue to progress workloads to maintain intensity without signs/symptoms of physical distress. Continue to progress workloads to maintain intensity without signs/symptoms of physical distress.    Average METs 3.26 2.24 2.35 2.09      Resistance Training   Training Prescription Yes Yes Yes Yes    Weight 3 lb 3 lb 3 lb 4 lb    Reps 10-15 10-15 10-15 10-15      Interval Training   Interval Training No No No No      Oxygen   Liters RA RA RA RA      NuStep   Level 5 -- 3 --    Minutes 30 -- 15 --    METs 4.6 -- 2.6 --      Arm Ergometer   Level -- -- 1 1.5    Minutes -- -- 15 15    METs -- -- 2.6 1      REL-XR   Level 5 5 -- --    Minutes 15 15 -- --    METs 3.6 3 -- --      T5 Nustep   Level -- 2 -- --    Minutes -- 30 -- --     METs -- 2.2 -- --      Biostep-RELP   Level -- -- -- 3    Minutes -- -- -- 15    METs -- -- -- 3      Track   Laps 8 18 20 28     Minutes 15 15 15 15     METs 1.44 1.98 2.09 2.52      Oxygen   Maintain Oxygen Saturation 88% or higher 88% or higher 88% or higher 88% or higher             Exercise Comments:   Exercise Comments     Row Name 03/13/23 1400           Exercise Comments First full day of exercise!  Patient was oriented to gym and equipment including functions, settings, policies, and procedures.  Patient's individual exercise prescription and treatment plan were reviewed.  All starting workloads were established based on the results of the 6 minute walk test done at initial orientation visit.  The plan for exercise progression was also introduced and progression will be customized based on patient's performance and goals.                Exercise Goals and Review:   Exercise Goals     Row Name 01/26/23  1645             Exercise Goals   Increase Physical Activity Yes       Intervention Develop an individualized exercise prescription for aerobic and resistive training based on initial evaluation findings, risk stratification, comorbidities and participant's personal goals.;Provide advice, education, support and counseling about physical activity/exercise needs.       Expected Outcomes Short Term: Attend rehab on a regular basis to increase amount of physical activity.;Long Term: Add in home exercise to make exercise part of routine and to increase amount of physical activity.;Long Term: Exercising regularly at least 3-5 days a week.       Increase Strength and Stamina Yes       Intervention Provide advice, education, support and counseling about physical activity/exercise needs.       Expected Outcomes Short Term: Perform resistance training exercises routinely during rehab and add in resistance training at home;Long Term: Improve cardiorespiratory  fitness, muscular endurance and strength as measured by increased METs and functional capacity ( );Short Term: Increase workloads from initial exercise prescription for resistance, speed, and METs.       Able to understand and use rate of perceived exertion (RPE) scale Yes       Intervention Provide education and explanation on how to use RPE scale       Expected Outcomes Short Term: Able to use RPE daily in rehab to express subjective intensity level;Long Term:  Able to use RPE to guide intensity level when exercising independently       Able to understand and use Dyspnea scale Yes       Intervention Provide education and explanation on how to use Dyspnea scale       Expected Outcomes Long Term: Able to use Dyspnea scale to guide intensity level when exercising independently;Short Term: Able to use Dyspnea scale daily in rehab to express subjective sense of shortness of breath during exertion       Knowledge and understanding of Target Heart Rate Range (THRR) Yes       Intervention Provide education and explanation of THRR including how the numbers were predicted and where they are located for reference       Expected Outcomes Short Term: Able to state/look up THRR;Long Term: Able to use THRR to govern intensity when exercising independently;Short Term: Able to use daily as guideline for intensity in rehab       Able to check pulse independently Yes       Intervention Provide education and demonstration on how to check pulse in carotid and radial arteries.;Review the importance of being able to check your own pulse for safety during independent exercise       Expected Outcomes Long Term: Able to check pulse independently and accurately;Short Term: Able to explain why pulse checking is important during independent exercise       Understanding of Exercise Prescription Yes       Intervention Provide education, explanation, and written materials on patient's individual exercise prescription        Expected Outcomes Short Term: Able to explain program exercise prescription;Long Term: Able to explain home exercise prescription to exercise independently                Exercise Goals Re-Evaluation :  Exercise Goals Re-Evaluation     Row Name 03/13/23 1400 03/16/23 1451 03/29/23 1341 04/12/23 1329 04/27/23 1541     Exercise Goal Re-Evaluation   Exercise Goals Review Increase Physical Activity;Able to understand  and use rate of perceived exertion (RPE) scale;Knowledge and understanding of Target Heart Rate Range (THRR);Understanding of Exercise Prescription;Increase Strength and Stamina;Able to check pulse independently;Able to understand and use Dyspnea scale Increase Physical Activity;Increase Strength and Stamina;Understanding of Exercise Prescription Increase Physical Activity;Increase Strength and Stamina;Understanding of Exercise Prescription Increase Physical Activity;Increase Strength and Stamina;Understanding of Exercise Prescription Increase Physical Activity;Increase Strength and Stamina;Understanding of Exercise Prescription   Comments Reviewed RPE scale, THR and program prescription with pt today.  Pt voiced understanding and was given a copy of goals to take home. Lugene is off to a good start in rehab. During her first session she was able to work at level 1 on the T5 nustep. She also did well with 3 lb hand weights for resistance training. We will continue to monitor her progress in the program. Rodney Booze completed 2 more sessions since last review, she was not able to attend the other sessions as she had to call out. She tried out the Biostep at level 1 and was able to walk 10 laps on the track! Patient has a trach to work with during exercise and has back up supplies with her here. Her oxygen saturations are staying nicely above 88%. We will continue to monitor, Camiryn has only completed one session since the last review and has been inconsistent with her attendance. She did perform her  exercise without oxygen during his last session and did well on room air. She was able to keep her O2 saturation above 88% as well. She also improved to level 2 on the T5 nustep and walked up to 12 laps on the track. We will continue to monitor her progress in the program. Mathel is doing well in rehab. She recently increased her overall average MET level to 2.03 METs. She also has continued to do well with exercise without oxygen, and her O2 saturation has only dropped below 90% once since the last review. She walked up to 18 laps as well, and improved to level 4 on the XR. We will continue to monitor her progress in the program.   Expected Outcomes Short: Use RPE daily to regulate intensity.  Long: Follow program prescription in THR. Short: Continue to attend rehab consistently. Long: Continue to follow current exercise prescription. Short: Continue working in Engineer, manufacturing systems on track to increase laps Long: Continue to increae overall MET level and stamina Short: Attend rehab more consistently, progressively increase workloads. Long: Continue to improve strength and stamina. Short: Continue to progressively increase workloads. Long: Continue to increase overall MET level and stamina.    Row Name 05/03/23 1427 05/10/23 0931 05/25/23 1417 06/10/23 1122 07/04/23 1517     Exercise Goal Re-Evaluation   Exercise Goals Review Increase Physical Activity;Increase Strength and Stamina;Understanding of Exercise Prescription Increase Physical Activity;Increase Strength and Stamina;Understanding of Exercise Prescription Increase Physical Activity;Increase Strength and Stamina;Understanding of Exercise Prescription Increase Physical Activity;Increase Strength and Stamina;Understanding of Exercise Prescription Increase Physical Activity;Increase Strength and Stamina;Understanding of Exercise Prescription   Comments Tritia is doing well in rehab. She is exercising some on her off days from rehab by walking.  She is doing  well with it; her breathing is her biggest limitation, but it is doing well.  She has noticed that her stamina is getting better with being in rehab. Sabel continues to do well in rehab. She was able to improve to level 5 on both the T4 nustep and the XR. She also has continued to do well walking on the track without oxygen,  as her O2 saturations have not dropped below 92%. She also increased her overall average MET level to 3.26 METs. We will continue to monitor her progress in the program. Imagen is doing well in rehab. She continues to work at level 5 on the XR and level 2 on the T5 nustep. She also increased her laps on the track back up to 18 laps. We will continue to monitor her progress in the program. Dawnel continues to work on increasein workloads and keeping her RPE and THRR within her parameters. She increased the T5 Nustep from level 2 to level 3 this revciew period. Increased her laps by 2. REmains using 3 lb hand weights. Encourage her to increase the T4 NUstep by 1-2 levels.  Will continue to monitor exercise progression with goal of increased stamina and strength. Jenah continues to do well in rehab. She walked up to 28 laps on the track and increased her biostep level to 3. She also increased from 3 lbs to 4 lbs for resistance training. We will continue to monitor her progress in the program.   Expected Outcomes Short: Continue to walk on off days Long: Continue to improve stamina Short: Continue to push for more laps on the track. Long: Continue to improve strength and stamina. Short: Continue to push for more laps on the track. Long: Continue to improve strength and stamina. ONG:EXBMWUXL workloads as tolerated with goal of increased stamina and strength. Continue to encourage slow progression.   LTG: Continued exercise progression as tolerated during program and after discharge. Short: Continue to push for more laps on the track. Long: Continue exercise to improve strength and stamina.             Discharge Exercise Prescription (Final Exercise Prescription Changes):  Exercise Prescription Changes - 07/04/23 1500       Response to Exercise   Blood Pressure (Admit) 146/90    Blood Pressure (Exit) 146/98    Heart Rate (Admit) 93 bpm    Heart Rate (Exercise) 122 bpm    Heart Rate (Exit) 124 bpm    Oxygen Saturation (Admit) 99 %    Oxygen Saturation (Exercise) 91 %    Oxygen Saturation (Exit) 94 %    Rating of Perceived Exertion (Exercise) 13    Perceived Dyspnea (Exercise) 2    Symptoms SOB    Duration Continue with 30 min of aerobic exercise without signs/symptoms of physical distress.    Intensity THRR unchanged      Progression   Progression Continue to progress workloads to maintain intensity without signs/symptoms of physical distress.    Average METs 2.09      Resistance Training   Training Prescription Yes    Weight 4 lb    Reps 10-15      Interval Training   Interval Training No      Oxygen   Liters RA      Arm Ergometer   Level 1.5    Minutes 15    METs 1      Biostep-RELP   Level 3    Minutes 15    METs 3      Track   Laps 28    Minutes 15    METs 2.52      Oxygen   Maintain Oxygen Saturation 88% or higher             Nutrition:  Target Goals: Understanding of nutrition guidelines, daily intake of sodium 1500mg , cholesterol 200mg , calories 30% from fat  and 7% or less from saturated fats, daily to have 5 or more servings of fruits and vegetables.  Education: All About Nutrition: -Group instruction provided by verbal, written material, interactive activities, discussions, models, and posters to present general guidelines for heart healthy nutrition including fat, fiber, MyPlate, the role of sodium in heart healthy nutrition, utilization of the nutrition label, and utilization of this knowledge for meal planning. Follow up email sent as well. Written material given at graduation.   Biometrics:  Pre Biometrics - 01/26/23 1624        Pre Biometrics   Height 5\' 9"  (1.753 m)    Weight 275 lb 4.8 oz (124.9 kg)    Waist Circumference 51 inches    Hip Circumference 52.5 inches    Waist to Hip Ratio 0.97 %    BMI (Calculated) 40.64    Single Leg Stand 14.2 seconds              Nutrition Therapy Plan and Nutrition Goals:  Nutrition Therapy & Goals - 06/21/23 1554       Nutrition Therapy   Diet Mediterranean    Protein (specify units) 90    Fiber 25 grams    Whole Grain Foods 3 servings    Saturated Fats 15 max. grams    Fruits and Vegetables 5 servings/day    Sodium 2 grams      Personal Nutrition Goals   Nutrition Goal Eat 3 times per day (carbs paired with protein)    Personal Goal #2 Limit fried foods    Comments She is drinking mostly vitamin water, some soda. Talked to her about ensuring she keep soda to a limit and meet a goal of ~64oz of water daily. She doesn't feel hungry most of the day. Will miss breakfast almost every day, dinner most days. Lunch is not always big in size but can at times be high calorie foods like pizza, fried chicken, ect. Educated on the Mediterranean diet and ways to eat more healthy foods, reduce bad fats, refined sugars, and sodium. But main focus is on getting enough nutrition with her small appetite. Build out several small meals and snacks centered around nutrient and calorie dense foods to help her meet her nutrition goals with as few bites as needed.      Intervention Plan   Intervention Nutrition handout(s) given to patient.;Prescribe, educate and counsel regarding individualized specific dietary modifications aiming towards targeted core components such as weight, hypertension, lipid management, diabetes, heart failure and other comorbidities.    Expected Outcomes Long Term Goal: Adherence to prescribed nutrition plan.;Short Term Goal: A plan has been developed with personal nutrition goals set during dietitian appointment.;Short Term Goal: Understand basic principles  of dietary content, such as calories, fat, sodium, cholesterol and nutrients.             Nutrition Assessments:  MEDIFICTS Score Key: ?70 Need to make dietary changes  40-70 Heart Healthy Diet ? 40 Therapeutic Level Cholesterol Diet  Flowsheet Row Pulmonary Rehab from 01/26/2023 in [facility] Cardiac and Pulmonary Rehab  Picture Your Plate Total Score on Admission 47      Picture Your Plate Scores: <84 Unhealthy dietary pattern with much room for improvement. 41-50 Dietary pattern unlikely to meet recommendations for good health and room for improvement. 51-60 More healthful dietary pattern, with some room for improvement.  >60 Healthy dietary pattern, although there may be some specific behaviors that could be improved.   Nutrition Goals Re-Evaluation:  Nutrition Goals Re-Evaluation  Row Name 04/10/23 1406 05/03/23 1431 05/29/23 1407 06/19/23 1407       Goals   Current Weight 274 lb (124.3 kg) -- 264 lb (119.7 kg) 266 lb (120.7 kg)    Nutrition Goal eat smaller portions. work on Mining engineer with RD Meet with RD    Comment Patient was informed on why it is important to maintain a balanced diet when dealing with Respiratory issues. Explained that it takes a lot of energy to breath and when they are short of breath often they will need to have a good diet to help keep up with the calories they are expending for breathing. Rodney Booze declined dietitian appt. Her weight is going down.  She is eating more fruit and cut back on portion sizes.  She is trying to do better with eating smaller meals. Nilaya would like to meet with RD Patient is talking to RD to set up for an appointment.    Expected Outcome Short: Choose and plan snacks accordingly to patients caloric intake to improve breathing. Long: Maintain a diet independently that meets their caloric intake to aid in daily shortness of breath. Short; Continue to add in more fruits Long: Continue to improve diet Short: Meet with RD. Long:  adhere to a diet plan that pertains to her. Short: meet with RD and lose weight. Long: lose weight and follow RD guidlines.             Nutrition Goals Discharge (Final Nutrition Goals Re-Evaluation):  Nutrition Goals Re-Evaluation - 06/19/23 1407       Goals   Current Weight 266 lb (120.7 kg)    Nutrition Goal Meet with RD    Comment Patient is talking to RD to set up for an appointment.    Expected Outcome Short: meet with RD and lose weight. Long: lose weight and follow RD guidlines.             Psychosocial: Target Goals: Acknowledge presence or absence of significant depression and/or stress, maximize coping skills, provide positive support system. Participant is able to verbalize types and ability to use techniques and skills needed for reducing stress and depression.   Education: Stress, Anxiety, and Depression - Group verbal and visual presentation to define topics covered.  Reviews how body is impacted by stress, anxiety, and depression.  Also discusses healthy ways to reduce stress and to treat/manage anxiety and depression.  Written material given at graduation.   Education: Sleep Hygiene -Provides group verbal and written instruction about how sleep can affect your health.  Define sleep hygiene, discuss sleep cycles and impact of sleep habits. Review good sleep hygiene tips.    Initial Review & Psychosocial Screening:  Initial Psych Review & Screening - 01/16/23 1217       Initial Review   Current issues with Current Stress Concerns    Source of Stress Concerns Chronic Illness    Comments Patient states that she has her kids and husband for support. Other parts of her family can be caled for support but not often. She wants to get her health back on track and breath better.      Family Dynamics   Good Support System? Yes    Comments Short: Start [facility] to help with mood. Long: Maintain a healthy mental state.      Screening Interventions   Interventions  Encouraged to exercise;To provide support and resources with identified psychosocial needs;Provide feedback about the scores to participant    Expected Outcomes Short Term  goal: Utilizing psychosocial counselor, staff and physician to assist with identification of specific Stressors or current issues interfering with healing process. Setting desired goal for each stressor or current issue identified.;Long Term Goal: Stressors or current issues are controlled or eliminated.;Short Term goal: Identification and review with participant of any Quality of Life or Depression concerns found by scoring the questionnaire.;Long Term goal: The participant improves quality of Life and PHQ9 Scores as seen by post scores and/or verbalization of changes             Quality of Life Scores:  Scores of 19 and below usually indicate a poorer quality of life in these areas.  A difference of  2-3 points is a clinically meaningful difference.  A difference of 2-3 points in the total score of the Quality of Life Index has been associated with significant improvement in overall quality of life, self-image, physical symptoms, and general health in studies assessing change in quality of life.  PHQ-9: Review Flowsheet       05/29/2023 04/10/2023 01/26/2023  Depression screen PHQ 2/9  Decreased Interest 2 2 3   Down, Depressed, Hopeless 0 0 1  PHQ - 2 Score 2 2 4   Altered sleeping 0 0 0  Tired, decreased energy 1 3 3   Change in appetite 1 0 0  Feeling bad or failure about yourself  0 0 0  Trouble concentrating 0 0 0  Moving slowly or fidgety/restless 0 0 0  Suicidal thoughts 0 0 0  PHQ-9 Score 4 5 7   Difficult doing work/chores Somewhat difficult Somewhat difficult Somewhat difficult    Details           Interpretation of Total Score  Total Score Depression Severity:  1-4 = Minimal depression, 5-9 = Mild depression, 10-14 = Moderate depression, 15-19 = Moderately severe depression, 20-27 = Severe depression    Psychosocial Evaluation and Intervention:  Psychosocial Evaluation - 01/16/23 1220       Psychosocial Evaluation & Interventions   Interventions Stress management education;Relaxation education;Encouraged to exercise with the program and follow exercise prescription    Comments Patient states that she has her kids and husband for support. Other parts of her family can be caled for support but not often. She wants to get her health back on track and breath better.    Expected Outcomes Short: Start [facility] to help with mood. Long: Maintain a healthy mental state.    Continue Psychosocial Services  Follow up required by staff             Psychosocial Re-Evaluation:  Psychosocial Re-Evaluation     Row Name 04/10/23 1411 05/03/23 1429 05/29/23 1417 06/19/23 1409       Psychosocial Re-Evaluation   Current issues with History of Depression History of Depression History of Depression None Identified    Comments Reviewed patient health questionnaire (PHQ-9) with patient for follow up. Previously, patients score indicated signs/symptoms of depression.  Reviewed to see if patient is improving symptom wise while in program.  Score improved and patient states that it is because she has been able to control herhealth more. Odell is doing well in rehab. She is feeling better mentally and in a better place with her mood since when she was first released.  She is sleeping well. Reviewed patient health questionnaire (PHQ-9) with patient for follow up. Previously, patients score indicated signs/symptoms of depression.  Reviewed to see if patient is improving symptom wise while in program.  Score improved and patient states that  it is because she has been able to have more energy. Patient reports no issues with their current mental states, sleep, stress, depression or anxiety. Will follow up with patient in a few weeks for any changes.    Expected Outcomes Short: Continue to attend [facility] regularly for  regular exercise and social engagement. Long: Continue to improve symptoms and manage a positive mental state. Short: Conitnue to exercise for mental boost LOng; Continue to stay positive Short: Continue to attend [facility] regularly for regular exercise and social engagement. Long: Continue to improve symptoms and manage a positive mental state. Short: Continue to exercise regularly to support mental health and notify staff of any changes. Long: maintain mental health and well being through teaching of rehab or prescribed medications independently.    Interventions Encouraged to attend Pulmonary Rehabilitation for the exercise Encouraged to attend Pulmonary Rehabilitation for the exercise Encouraged to attend Pulmonary Rehabilitation for the exercise Encouraged to attend Pulmonary Rehabilitation for the exercise    Continue Psychosocial Services  Follow up required by staff Follow up required by staff Follow up required by staff Follow up required by staff             Psychosocial Discharge (Final Psychosocial Re-Evaluation):  Psychosocial Re-Evaluation - 06/19/23 1409       Psychosocial Re-Evaluation   Current issues with None Identified    Comments Patient reports no issues with their current mental states, sleep, stress, depression or anxiety. Will follow up with patient in a few weeks for any changes.    Expected Outcomes Short: Continue to exercise regularly to support mental health and notify staff of any changes. Long: maintain mental health and well being through teaching of rehab or prescribed medications independently.    Interventions Encouraged to attend Pulmonary Rehabilitation for the exercise    Continue Psychosocial Services  Follow up required by staff             Education: Education Goals: Education classes will be provided on a weekly basis, covering required topics. Participant will state understanding/return demonstration of topics presented.  Learning  Barriers/Preferences:  Learning Barriers/Preferences - 01/16/23 1215       Learning Barriers/Preferences   Learning Barriers None    Learning Preferences None             General Pulmonary Education Topics:  Infection Prevention: - Provides verbal and written material to individual with discussion of infection control including proper hand washing and proper equipment cleaning during exercise session. Flowsheet Row Pulmonary Rehab from 04/12/2023 in [facility] Cardiac and Pulmonary Rehab  Date 01/16/23  Educator [facility]  Instruction Review Code 1- Verbalizes Understanding       Falls Prevention: - Provides verbal and written material to individual with discussion of falls prevention and safety. Flowsheet Row Pulmonary Rehab from 04/12/2023 in [facility] Cardiac and Pulmonary Rehab  Date 01/16/23  Educator [facility]  Instruction Review Code 1- Verbalizes Understanding       Chronic Lung Disease Review: - Group verbal instruction with posters, models, PowerPoint presentations and videos,  to review new updates, new respiratory medications, new advancements in procedures and treatments. Providing information on websites and "800" numbers for continued self-education. Includes information about supplement oxygen, available portable oxygen systems, continuous and intermittent flow rates, oxygen safety, concentrators, and Medicare reimbursement for oxygen. Explanation of Pulmonary Drugs, including class, frequency, complications, importance of spacers, rinsing mouth after steroid MDI's, and proper cleaning methods for nebulizers. Review of basic lung anatomy and physiology related to function,  structure, and complications of lung disease. Review of risk factors. Discussion about methods for diagnosing sleep apnea and types of masks and machines for OSA. Includes a review of the use of types of environmental controls: home humidity, furnaces, filters, dust mite/pet prevention, HEPA vacuums. Discussion about  weather changes, air quality and the benefits of nasal washing. Instruction on Warning signs, infection symptoms, calling MD promptly, preventive modes, and value of vaccinations. Review of effective airway clearance, coughing and/or vibration techniques. Emphasizing that all should Create an Action Plan. Written material given at graduation. Flowsheet Row Pulmonary Rehab from 04/12/2023 in [facility] Cardiac and Pulmonary Rehab  Education need identified 01/26/23       AED/CPR: - Group verbal and written instruction with the use of models to demonstrate the basic use of the AED with the basic ABC's of resuscitation.    Anatomy and Cardiac Procedures: - Group verbal and visual presentation and models provide information about basic cardiac anatomy and function. Reviews the testing methods done to diagnose heart disease and the outcomes of the test results. Describes the treatment choices: Medical Management, Angioplasty, or Coronary Bypass Surgery for treating various heart conditions including Myocardial Infarction, Angina, Valve Disease, and Cardiac Arrhythmias.  Written material given at graduation.   Medication Safety: - Group verbal and visual instruction to review commonly prescribed medications for heart and lung disease. Reviews the medication, class of the drug, and side effects. Includes the steps to properly store meds and maintain the prescription regimen.  Written material given at graduation.   Other: -Provides group and verbal instruction on various topics (see comments)   Knowledge Questionnaire Score:  Knowledge Questionnaire Score - 01/26/23 1557       Knowledge Questionnaire Score   Pre Score 15/18              Core Components/Risk Factors/Patient Goals at Admission:  Personal Goals and Risk Factors at Admission - 01/26/23 1646       Core Components/Risk Factors/Patient Goals on Admission    Weight Management Yes;Weight Loss;Obesity    Intervention Weight  Management: Develop a combined nutrition and exercise program designed to reach desired caloric intake, while maintaining appropriate intake of nutrient and fiber, sodium and fats, and appropriate energy expenditure required for the weight goal.;Weight Management: Provide education and appropriate resources to help participant work on and attain dietary goals.;Weight Management/Obesity: Establish reasonable short term and long term weight goals.;Obesity: Provide education and appropriate resources to help participant work on and attain dietary goals.    Admit Weight 275 lb (124.7 kg)    Goal Weight: Short Term 270 lb (122.5 kg)    Goal Weight: Long Term 225 lb (102.1 kg)    Expected Outcomes Short Term: Continue to assess and modify interventions until short term weight is achieved;Long Term: Adherence to nutrition and physical activity/exercise program aimed toward attainment of established weight goal;Weight Loss: Understanding of general recommendations for a balanced deficit meal plan, which promotes 1-2 lb weight loss per week and includes a negative energy balance of [phone] kcal/d;Understanding recommendations for meals to include 15-35% energy as protein, 25-35% energy from fat, 35-60% energy from carbohydrates, less than 200mg  of dietary cholesterol, 20-35 gm of total fiber daily;Understanding of distribution of calorie intake throughout the day with the consumption of 4-5 meals/snacks    Improve shortness of breath with ADL's Yes    Intervention Provide education, individualized exercise plan and daily activity instruction to help decrease symptoms of SOB with activities of daily living.  Expected Outcomes Short Term: Improve cardiorespiratory fitness to achieve a reduction of symptoms when performing ADLs;Long Term: Be able to perform more ADLs without symptoms or delay the onset of symptoms    Increase knowledge of respiratory medications and ability to use respiratory devices properly  Yes     Intervention Provide education and demonstration as needed of appropriate use of medications, inhalers, and oxygen therapy.    Expected Outcomes Short Term: Achieves understanding of medications use. Understands that oxygen is a medication prescribed by physician. Demonstrates appropriate use of inhaler and oxygen therapy.;Long Term: Maintain appropriate use of medications, inhalers, and oxygen therapy.             Education:Diabetes - Individual verbal and written instruction to review signs/symptoms of diabetes, desired ranges of glucose level fasting, after meals and with exercise. Acknowledge that pre and post exercise glucose checks will be done for 3 sessions at entry of program.   Know Your Numbers and Heart Failure: - Group verbal and visual instruction to discuss disease risk factors for cardiac and pulmonary disease and treatment options.  Reviews associated critical values for Overweight/Obesity, Hypertension, Cholesterol, and Diabetes.  Discusses basics of heart failure: signs/symptoms and treatments.  Introduces Heart Failure Zone chart for action plan for heart failure.  Written material given at graduation.   Core Components/Risk Factors/Patient Goals Review:   Goals and Risk Factor Review     Row Name 04/10/23 1405 05/03/23 1433 05/29/23 1409 06/19/23 1410       Core Components/Risk Factors/Patient Goals Review   Personal Goals Review Improve shortness of breath with ADL's Improve shortness of breath with ADL's;Weight Management/Obesity;Increase knowledge of respiratory medications and ability to use respiratory devices properly. Weight Management/Obesity Weight Management/Obesity    Review Spoke to patient about their shortness of breath and what they can do to improve. Patient has been informed of breathing techniques when starting the program. Patient is informed to tell staff if they have had any med changes and that certain meds they are taking or not taking can be  causing shortness of breath. Malkia is doing well with her weight and continues to work on weight loss.  Her breathing is getting better and she is no longer needing oxygen for exercise.  She is still using her nebulizer and doing well with it. Nathania would like to continue to lose weight. She would like to lose weight before her surgery in September.Rodney Booze is going to work on losing a few pounds in the next couple weeks. Mekiyah states she has no questions at this time about her medications or her health. She wants to continue working on weight loss.    Expected Outcomes Short: Attend [facility] regularly to improve shortness of breath with ADL's. Long: maintain independence with ADL's Short: Continue to exercise to help with weight loss long: Continue to monitor risk factors Short: lose some weight. Long: Reach weight goal. Short: lose some weight. Long: Reach weight goal.             Core Components/Risk Factors/Patient Goals at Discharge (Final Review):   Goals and Risk Factor Review - 06/19/23 1410       Core Components/Risk Factors/Patient Goals Review   Personal Goals Review Weight Management/Obesity    Review Indica states she has no questions at this time about her medications or her health. She wants to continue working on weight loss.    Expected Outcomes Short: lose some weight. Long: Reach weight goal.  ITP Comments:  ITP Comments     Row Name 01/16/23 1212 01/26/23 1553 02/15/23 0932 03/13/23 1359 03/15/23 1401   ITP Comments Virtual Visit completed. Patient informed on EP and RD appointment and 6 Minute walk test. Patient also informed of patient health questionnaires on My Chart. Patient Verbalizes understanding. Visit diagnosis can be found in [facility] 12/27/[facility]. Completed and gym orientation. Initial ITP created and sent for review to Dr. Vida Rigger,  Medical Director. 30 Day review completed. Medical Director ITP review done, changes made as directed, and  signed approval by Medical Director.    new to program First full day of exercise!  Patient was oriented to gym and equipment including functions, settings, policies, and procedures.  Patient's individual exercise prescription and treatment plan were reviewed.  All starting workloads were established based on the results of the 6 minute walk test done at initial orientation visit.  The plan for exercise progression was also introduced and progression will be customized based on patient's performance and goals. 30 day review completed. ITP sent to Dr. Jinny Sanders, Medical Director of  Pulmonary Rehab. Continue with ITP unless changes are made by physician. First day of exercise was just this week, pt delayed her start.    Row Name 04/04/23 1159 04/12/23 0936 05/10/23 1129 06/06/23 1502 07/05/23 1201   ITP Comments Kaytlyn called to let us know that she was still have significant back pain.  She went to ED for it and was given a shot and pain killers.  She has been resting and icing.  She wants to give it until Monday 5/13 to try again. 30 Day review completed. Medical Director ITP review done, changes made as directed, and signed approval by Medical Director. 30 Day review completed. Medical Director ITP review done, changes made as directed, and signed approval by Medical Director. 30 Day review completed. Medical Director ITP review done, changes made as directed, and signed approval by Medical Director. 30 Day review completed. Medical Director ITP review done, changes made as directed, and signed approval by Medical Director.            Comments:

## 2023-07-06 ENCOUNTER — Ambulatory Visit: Payer: Medicaid Other | Admitting: Pulmonary Disease

## 2023-07-10 ENCOUNTER — Encounter: Payer: 59 | Admitting: *Deleted

## 2023-07-10 DIAGNOSIS — J4540 Moderate persistent asthma, uncomplicated: Secondary | ICD-10-CM | POA: Diagnosis not present

## 2023-07-10 NOTE — Progress Notes (Signed)
Daily Session Note  Patient Details  Name: Kathy Vega MRN: [id number] Date of Birth: 1976/09/12 Referring Provider:   Flowsheet Row Pulmonary Rehab from 01/26/2023 in [facility] Cardiac and Pulmonary Rehab  Referring Provider Sarina Ser MD       Encounter Date: 07/10/2023  Check In:  Session Check In - 07/10/23 1417       Check-In   Supervising physician immediately available to respond to emergencies See telemetry face sheet for immediately available ER MD    Location [facility]-Cardiac & Pulmonary Rehab    Staff Present Susann Givens, RN BSN;Megan Katrinka Blazing, RN, Bobetta Lime, RCP,RRT,BSRT    Virtual Visit No    Medication changes reported     No    Fall or balance concerns reported    No    Warm-up and Cool-down Performed on first and last piece of equipment    Resistance Training Performed Yes    VAD Patient? No    PAD/SET Patient? No      Pain Assessment   Currently in Pain? No/denies                Social History   Tobacco Use  Smoking Status Former   Current packs/day: 0.00   Average packs/day: 0.5 packs/day for 5.0 years (2.5 ttl pk-yrs)   Types: Cigarettes   Start date: 66   Quit date: 2000   Years since quitting: 24.6  Smokeless Tobacco Never    Goals Met:  Independence with exercise equipment Exercise tolerated well No report of concerns or symptoms today Strength training completed today  Goals Unmet:  Not Applicable  Comments: Pt able to follow exercise prescription today without complaint.  Will continue to monitor for progression.    Dr. Bethann Punches is Medical Director for [facility] Cardiac Rehabilitation.  Dr. Vida Rigger is Medical Director for [facility] Pulmonary Rehabilitation.

## 2023-07-12 ENCOUNTER — Inpatient Hospital Stay: Payer: 59

## 2023-07-12 ENCOUNTER — Inpatient Hospital Stay: Payer: 59 | Attending: Internal Medicine | Admitting: Internal Medicine

## 2023-07-12 ENCOUNTER — Encounter: Payer: Self-pay | Admitting: Internal Medicine

## 2023-07-12 ENCOUNTER — Encounter: Payer: 59 | Admitting: *Deleted

## 2023-07-12 VITALS — BP 109/73 | HR 79

## 2023-07-12 VITALS — BP 123/85 | HR 93 | Temp 97.7°F | Ht 69.0 in | Wt 269.4 lb

## 2023-07-12 DIAGNOSIS — D509 Iron deficiency anemia, unspecified: Secondary | ICD-10-CM | POA: Diagnosis present

## 2023-07-12 DIAGNOSIS — N920 Excessive and frequent menstruation with regular cycle: Secondary | ICD-10-CM | POA: Diagnosis not present

## 2023-07-12 DIAGNOSIS — J4540 Moderate persistent asthma, uncomplicated: Secondary | ICD-10-CM

## 2023-07-12 DIAGNOSIS — Z87891 Personal history of nicotine dependence: Secondary | ICD-10-CM | POA: Diagnosis not present

## 2023-07-12 DIAGNOSIS — E611 Iron deficiency: Secondary | ICD-10-CM

## 2023-07-12 DIAGNOSIS — R7303 Prediabetes: Secondary | ICD-10-CM | POA: Insufficient documentation

## 2023-07-12 LAB — IRON AND TIBC
Iron: 66 ug/dL (ref 28–170)
Saturation Ratios: 16 % (ref 10.4–31.8)
TIBC: 402 ug/dL (ref 250–450)
UIBC: 336 ug/dL

## 2023-07-12 LAB — BASIC METABOLIC PANEL - CANCER CENTER ONLY
Anion gap: 9 (ref 5–15)
BUN: 11 mg/dL (ref 6–20)
CO2: 24 mmol/L (ref 22–32)
Calcium: 9.7 mg/dL (ref 8.9–10.3)
Chloride: 104 mmol/L (ref 98–111)
Creatinine: 0.74 mg/dL (ref 0.44–1.00)
GFR, Estimated: >60 mL/min (ref >60)
Glucose, Bld: 111 mg/dL — ABNORMAL HIGH (ref 70–99)
Potassium: 3.8 mmol/L (ref 3.5–5.1)
Sodium: 137 mmol/L (ref 135–145)

## 2023-07-12 LAB — CBC WITH DIFFERENTIAL (CANCER CENTER ONLY)
Abs Immature Granulocytes: 0.02 10*3/uL (ref 0.00–0.07)
Basophils Absolute: 0.1 10*3/uL (ref 0.0–0.1)
Basophils Relative: 1 %
Eosinophils Absolute: 0.1 10*3/uL (ref 0.0–0.5)
Eosinophils Relative: 1 %
HCT: 38.7 % (ref 36.0–46.0)
Hemoglobin: 12.3 g/dL (ref 12.0–15.0)
Immature Granulocytes: 0 %
Lymphocytes Relative: 26 %
Lymphs Abs: 1.6 10*3/uL (ref 0.7–4.0)
MCH: 24.8 pg — ABNORMAL LOW (ref 26.0–34.0)
MCHC: 31.8 g/dL (ref 30.0–36.0)
MCV: 78.2 fL — ABNORMAL LOW (ref 80.0–100.0)
Monocytes Absolute: 0.4 10*3/uL (ref 0.1–1.0)
Monocytes Relative: 6 %
Neutro Abs: 4 10*3/uL (ref 1.7–7.7)
Neutrophils Relative %: 66 %
Platelet Count: 333 10*3/uL (ref 150–400)
RBC: 4.95 MIL/uL (ref 3.87–5.11)
RDW: 15.2 % (ref 11.5–15.5)
WBC Count: 6.1 10*3/uL (ref 4.0–10.5)
nRBC: 0 % (ref 0.0–0.2)

## 2023-07-12 LAB — FERRITIN: Ferritin: 41 ng/mL (ref 11–307)

## 2023-07-12 MED ORDER — SODIUM CHLORIDE 0.9 % IV SOLN
Freq: Once | INTRAVENOUS | Status: AC
  Filled 2023-07-12: qty 250

## 2023-07-12 MED ORDER — SODIUM CHLORIDE 0.9 % IV SOLN
200.0000 mg | Freq: Once | INTRAVENOUS | Status: AC
  Administered 2023-07-12: 200 mg via INTRAVENOUS
  Filled 2023-07-12: qty 200

## 2023-07-12 NOTE — Assessment & Plan Note (Signed)
#  Chronic severe anemia- Hb- 7-8; iron saturation 2% [AUG 2023]-very symptomatic.  Likely secondary to menorrhagia.  Recommend continue PO iron.   # Today Hb 12 - Proceed with venofer today.   #Etiology of iron deficiency: Likely secondary to menorrhagia. S/p Dr. Logan Bores gynecology Nov 13th, 2023. Down the line consider GI referral.   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  Stable.   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 Etiology- ? Prediabetes- defer to PCP.    # DISPOSITION: # venofer today # venofer week  x 1 # follow up in 4 months- MD: labs- cbc/bmp;; iron studies; ferritin;possible Venofer- Dr.B

## 2023-07-12 NOTE — Progress Notes (Signed)
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  Accompanied by family.  Kathy Vega 47 y.o.  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  Continues to have heavy menstrual cycles; no blood in stools. She is currently being followed by gynecology.  She complains of fatigue and generalized weakness along with shortness of breath on exertion.  C/o tingling in hands. C/o feet burning and itching x2 weeks, sometimes hurting to bad to walk. PCP told her she is pre-diabetic.   C/o heart palpatations/irregular, nothing new or worsening.   Review of Systems  Constitutional:  Positive for malaise/fatigue. Negative for chills, diaphoresis, fever and weight loss.  HENT:  Negative for nosebleeds and sore throat.   Eyes:  Negative for double vision.  Respiratory:  Negative for cough, hemoptysis, sputum production, shortness of breath and wheezing.   Cardiovascular:  Negative for chest pain, palpitations, orthopnea and leg swelling.  Gastrointestinal:  Negative for abdominal pain, blood in stool,  constipation, diarrhea, heartburn, melena, nausea and vomiting.  Genitourinary:  Negative for dysuria, frequency and urgency.  Musculoskeletal:  Positive for back pain and joint pain.  Skin: Negative.  Negative for itching and rash.  Neurological:  Negative for dizziness, tingling, focal weakness, weakness and headaches.  Endo/Heme/Allergies:  Does not bruise/bleed easily.  Psychiatric/Behavioral:  Negative for depression. The patient is not nervous/anxious and does not have insomnia.     MEDICAL HISTORY:  Past Medical History:  Diagnosis Date   Acute encephalopathy 08/13/2020   Acute renal failure (ARF) (HCC) 08/13/2020   Anemia    Asthma    Cervical cancer (HCC)    Hypothyroidism    Iron deficiency anemia    Obesity (BMI 30-39.9)    Pneumonia due to COVID-19 virus 08/13/2020   requiring trach and prolonged hospitalization   Subglottic stenosis    Tracheal stenosis    Tracheostomy tube present [facility])     SURGICAL HISTORY: Past Surgical History:  Procedure Laterality Date   CARDIAC CATHETERIZATION     CERVICAL BIOPSY  W/ LOOP ELECTRODE EXCISION     CESAREAN SECTION     CHOLECYSTECTOMY     LARYNGOSCOPY  04/16/2021   LARYNGOSCOPY  02/25/2022   LARYNGOSCOPY  08/13/2021   LEG SURGERY     TRACHEOSTOMY TUBE PLACEMENT N/A 09/04/2020   Procedure: TRACHEOSTOMY;  Surgeon: Linus Salmons, MD;  Location: [facility] ORS;  Service: ENT;  Laterality: N/A;   TUBAL LIGATION      SOCIAL HISTORY: Social History   Socioeconomic History   Marital status: Married    Spouse name: Tiburcio Bash   Number of children: 3   Years of education: Not on file   Highest education level:  Not on file  Occupational History   Not on file  Tobacco Use   Smoking status: Former    Current packs/day: 0.00    Average packs/day: 0.5 packs/day for 5.0 years (2.5 ttl pk-yrs)    Types: Cigarettes    Start date: 24    Quit date: 2000    Years since quitting: 24.6   Smokeless tobacco: Never  Vaping Use    Vaping status: Never Used  Substance and Sexual Activity   Alcohol use: No   Drug use: Never   Sexual activity: Yes    Birth control/protection: Surgical    Comment: BTL  Other Topics Concern   Not on file  Social History Narrative   Not on file   Social Determinants of Health   Financial Resource Strain: Not on file  Food Insecurity: Not on file  Transportation Needs: Not on file  Physical Activity: Not on file  Stress: Not on file  Social Connections: Not on file  Intimate Partner Violence: Not on file    FAMILY HISTORY: Family History  Problem Relation Age of Onset   Hyperlipidemia Mother    Hypertension Mother    Heart disease Father    Liver cancer Father    Breast cancer Neg Hx    Ovarian cancer Neg Hx     ALLERGIES:  has No Known Allergies.  MEDICATIONS:  Current Outpatient Medications  Medication Sig Dispense Refill   acetaminophen (TYLENOL) 500 MG tablet Take 500 mg by mouth every 6 (six) hours as needed for headache, fever, moderate pain or mild pain.     albuterol (PROVENTIL) (2.5 MG/3ML) 0.083% nebulizer solution Take 3 mLs (2.5 mg total) by nebulization every 6 (six) hours as needed for wheezing or shortness of breath. 360 mL 11   ascorbic acid (VITAMIN C) 500 MG tablet Take 1 tablet (500 mg total) by mouth daily. 30 tablet 0   bisoprolol (ZEBETA) 5 MG tablet Take 1 tablet (5 mg total) by mouth daily. 30 tablet 6   formoterol (PERFOROMIST) 20 MCG/2ML nebulizer solution Take 2 mLs (20 mcg total) by nebulization 2 (two) times daily. Use before the budesonide, twice a day. 120 mL 11   furosemide (LASIX) 20 MG tablet Take 1 tablet (20 mg total) by mouth daily as needed for edema (shortness of breath). 90 tablet 3   HYDROcodone-acetaminophen (NORCO/VICODIN) 5-325 MG tablet Take 1 tablet by mouth every 4 (four) hours as needed for moderate pain. 12 tablet 0   hydrOXYzine (ATARAX) 10 MG tablet Take 10 mg by mouth 3 (three) times daily.     iron polysaccharides  (NIFEREX) 150 MG capsule Take 1 capsule (150 mg total) by mouth daily. 30 capsule 1   levothyroxine (SYNTHROID) 125 MCG tablet Take 250 mcg by mouth daily.     meloxicam (MOBIC) 15 MG tablet Take 1 tablet (15 mg total) by mouth daily. 30 tablet 0   methocarbamol (ROBAXIN) 500 MG tablet Take 1 tablet (500 mg total) by mouth 4 (four) times daily. 30 tablet 0   norethindrone (AYGESTIN) 5 MG tablet Take 2 tablets (10 mg total) by mouth daily. 30 tablet 1   nystatin cream (MYCOSTATIN) Apply topically 2 (two) times daily. 30 g 0   OXYGEN Inhale into the lungs at bedtime as needed. Up to 4 liters     pantoprazole (PROTONIX) 40 MG tablet Take 1 tablet (40 mg total) by mouth daily. 30 tablet 2   propranolol (INDERAL) 10 MG tablet Take 1 tablet (10  mg total) by mouth 3 (three) times daily as needed (palpitations). 90 tablet 6   budesonide (PULMICORT) 0.5 MG/2ML nebulizer solution Take 2 mLs (0.5 mg total) by nebulization 2 (two) times daily. 120 mL 5   No current facility-administered medications for this visit.    PHYSICAL EXAMINATION:   Vitals:   07/12/23 1242  BP: 123/85  Pulse: 93  Temp: 97.7 F (36.5 C)  SpO2: 98%    Filed Weights   07/12/23 1242  Weight: 269 lb 6.4 oz (122.2 kg)    Trach in place.   Physical Exam Vitals and nursing note reviewed.  HENT:     Head: Normocephalic and atraumatic.     Mouth/Throat:     Pharynx: Oropharynx is clear.  Eyes:     Extraocular Movements: Extraocular movements intact.     Pupils: Pupils are equal, round, and reactive to light.  Cardiovascular:     Rate and Rhythm: Normal rate and regular rhythm.  Pulmonary:     Comments: Decreased breath sounds bilaterally.  Abdominal:     Palpations: Abdomen is soft.  Musculoskeletal:        General: Normal range of motion.     Cervical back: Normal range of motion.  Skin:    General: Skin is warm.  Neurological:     General: No focal deficit present.     Mental Status: She is alert and  oriented to person, place, and time.  Psychiatric:        Behavior: Behavior normal.        Judgment: Judgment normal.      LABORATORY DATA:  I have reviewed the data as listed Lab Results  Component Value Date   WBC 6.1 07/12/2023   HGB 12.3 07/12/2023   HCT 38.7 07/12/2023   MCV 78.2 (L) 07/12/2023   PLT 333 07/12/2023   Recent Labs    07/18/22 0429 08/05/22 1227 08/14/22 0954 01/27/23 1358 04/11/23 1408 07/12/23 1227  NA 139 138 139 135 138 137  K 4.0 4.0 4.1 3.5 3.9 3.8  CL 107 107 109 103 104 104  CO2 26 25 23 25 26 24   GLUCOSE 114* 105* 127* 130* 98 111*  BUN 8 11 8 8 12 11   CREATININE 0.79 0.83 0.91 0.95 0.80 0.74  CALCIUM 9.2 9.2 9.1 9.3 9.5 9.7  GFRNONAA >60 >60 >60 >60 >60 >60  PROT 6.7 7.9 7.6  --   --   --   ALBUMIN 3.6 4.4 4.4  --   --   --   AST 19 16 26   --   --   --   ALT 12 14 26   --   --   --   ALKPHOS 54 66 68  --   --   --   BILITOT 0.8 0.4 0.5  --   --   --      No results found.  ASSESSMENT & PLAN:   Iron deficiency #Chronic severe anemia- Hb- 7-8; iron saturation 2% [AUG 2023]-very symptomatic.  Likely secondary to menorrhagia.  Recommend continue PO iron.   # Today Hb 12 - Proceed with venofer today.   #Etiology of iron deficiency: Likely secondary to menorrhagia. S/p Dr. Logan Bores gynecology Nov 13th, 2023. Down the line consider GI referral.   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  Stable.   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 Etiology- ? Prediabetes- defer to PCP.    #  DISPOSITION: # venofer today # venofer week  x 1 # follow up in 4 months- MD: labs- cbc/bmp;; iron studies; ferritin;possible Venofer- Dr.B   All questions were answered. The patient knows to call the clinic with any problems, questions or concerns.  Earna Coder, MD 07/12/2023 1:22 PM

## 2023-07-12 NOTE — Progress Notes (Signed)
Fatigue/weakness: yes Dyspena: at times Light headedness: yes Blood in stool: no  PCP told her she is pre-diabetic.  C/o feet burning and itching x2 weeks, sometimes hurting to bad to walk.  C/o heart palpatations/irregular, nothing new or worsening.  Tingling in hands.  Occasional SOB.  C/o tingling in hands.

## 2023-07-12 NOTE — Patient Instructions (Signed)
 Iron Sucrose Injection What is this medication? IRON SUCROSE (EYE ern SOO krose) treats low levels of iron (iron deficiency anemia) in people with kidney disease. Iron is a mineral that plays an important role in making red blood cells, which carry oxygen from your lungs to the rest of your body. This medicine may be used for other purposes; ask your health care provider or pharmacist if you have questions. COMMON BRAND NAME(S): Venofer What should I tell my care team before I take this medication? They need to know if you have any of these conditions: Anemia not caused by low iron levels Heart disease High levels of iron in the blood Kidney disease Liver disease An unusual or allergic reaction to iron, other medications, foods, dyes, or preservatives Pregnant or trying to get pregnant Breastfeeding How should I use this medication? This medication is for infusion into a vein. It is given in a hospital or clinic setting. Talk to your care team about the use of this medication in children. While this medication may be prescribed for children as young as 2 years for selected conditions, precautions do apply. Overdosage: If you think you have taken too much of this medicine contact a poison control center or emergency room at once. NOTE: This medicine is only for you. Do not share this medicine with others. What if I miss a dose? Keep appointments for follow-up doses. It is important not to miss your dose. Call your care team if you are unable to keep an appointment. What may interact with this medication? Do not take this medication with any of the following: Deferoxamine Dimercaprol Other iron products This medication may also interact with the following: Chloramphenicol Deferasirox This list may not describe all possible interactions. Give your health care provider a list of all the medicines, herbs, non-prescription drugs, or dietary supplements you use. Also tell them if you smoke,  drink alcohol, or use illegal drugs. Some items may interact with your medicine. What should I watch for while using this medication? Visit your care team regularly. Tell your care team if your symptoms do not start to get better or if they get worse. You may need blood work done while you are taking this medication. You may need to follow a special diet. Talk to your care team. Foods that contain iron include: whole grains/cereals, dried fruits, beans, or peas, leafy green vegetables, and organ meats (liver, kidney). What side effects may I notice from receiving this medication? Side effects that you should report to your care team as soon as possible: Allergic reactions--skin rash, itching, hives, swelling of the face, lips, tongue, or throat Low blood pressure--dizziness, feeling faint or lightheaded, blurry vision Shortness of breath Side effects that usually do not require medical attention (report to your care team if they continue or are bothersome): Flushing Headache Joint pain Muscle pain Nausea Pain, redness, or irritation at injection site This list may not describe all possible side effects. Call your doctor for medical advice about side effects. You may report side effects to FDA at 1-800-FDA-1088. Where should I keep my medication? This medication is given in a hospital or clinic. It will not be stored at home. NOTE: This sheet is a summary. It may not cover all possible information. If you have questions about this medicine, talk to your doctor, pharmacist, or health care provider.  2024 Elsevier/Gold Standard (2023-04-21 00:00:00)

## 2023-07-12 NOTE — Progress Notes (Signed)
Declined 30 minute post observation. Aware of risks. Vitals stable at discharge.  

## 2023-07-12 NOTE — Progress Notes (Signed)
Daily Session Note  Patient Details  Name: Kathy Vega MRN: [id number] Date of Birth: [birth year] Referring Provider:   Flowsheet Row Pulmonary Rehab from 01/26/2023 in [facility] Cardiac and Pulmonary Rehab  Referring Provider Sarina Ser MD       Encounter Date: 07/12/2023  Check In:  Session Check In - 07/12/23 1105       Check-In   Supervising physician immediately available to respond to emergencies See telemetry face sheet for immediately available ER MD    Location [facility]-Cardiac & Pulmonary Rehab    Staff Present Rory Percy, MS, Exercise Physiologist;Maxon Conetta BS, , Exercise Physiologist; Katrinka Blazing, RN, ADN    Virtual Visit No    Medication changes reported     No    Fall or balance concerns reported    No    Warm-up and Cool-down Performed on first and last piece of equipment    Resistance Training Performed Yes    VAD Patient? No    PAD/SET Patient? No      Pain Assessment   Currently in Pain? No/denies                Social History   Tobacco Use  Smoking Status Former   Current packs/day: 0.00   Average packs/day: 0.5 packs/day for 5.0 years (2.5 ttl pk-yrs)   Types: Cigarettes   Start date: 18   Quit date: 2000   Years since quitting: 24.6  Smokeless Tobacco Never    Goals Met:  Independence with exercise equipment Exercise tolerated well No report of concerns or symptoms today Strength training completed today  Goals Unmet:  Not Applicable  Comments: Pt able to follow exercise prescription today without complaint.  Will continue to monitor for progression.    Dr. Bethann Punches is Medical Director for [facility] Cardiac Rehabilitation.  Dr. Vida Rigger is Medical Director for [facility] Pulmonary Rehabilitation.

## 2023-07-17 ENCOUNTER — Encounter: Payer: 59 | Admitting: *Deleted

## 2023-07-17 DIAGNOSIS — J4540 Moderate persistent asthma, uncomplicated: Secondary | ICD-10-CM

## 2023-07-17 NOTE — Progress Notes (Signed)
Daily Session Note  Patient Details  Name: Kathy Vega MRN: [id number] Date of Birth: [birth year] Referring Provider:   Flowsheet Row Pulmonary Rehab from 01/26/2023 in [facility] Cardiac and Pulmonary Rehab  Referring Provider Sarina Ser MD       Encounter Date: 07/17/2023  Check In:  Session Check In - 07/17/23 1404       Check-In   Supervising physician immediately available to respond to emergencies See telemetry face sheet for immediately available ER MD    Location [facility]-Cardiac & Pulmonary Rehab    Staff Present Susann Givens, RN BSN;Laureen Manson Passey, BS, RRT, CPFT;Megan Katrinka Blazing, RN, ADN    Virtual Visit No    Medication changes reported     No    Fall or balance concerns reported    No    Warm-up and Cool-down Performed on first and last piece of equipment    Resistance Training Performed Yes    VAD Patient? No    PAD/SET Patient? No      Pain Assessment   Currently in Pain? No/denies                Social History   Tobacco Use  Smoking Status Former   Current packs/day: 0.00   Average packs/day: 0.5 packs/day for 5.0 years (2.5 ttl pk-yrs)   Types: Cigarettes   Start date: 80   Quit date: 2000   Years since quitting: 24.6  Smokeless Tobacco Never    Goals Met:  Independence with exercise equipment Exercise tolerated well No report of concerns or symptoms today Strength training completed today  Goals Unmet:  Not Applicable  Comments: Pt able to follow exercise prescription today without complaint.  Will continue to monitor for progression.    Dr. Bethann Punches is Medical Director for [facility] Cardiac Rehabilitation.  Dr. Vida Rigger is Medical Director for [facility] Pulmonary Rehabilitation.

## 2023-07-18 ENCOUNTER — Ambulatory Visit: Payer: Medicaid Other | Admitting: Obstetrics and Gynecology

## 2023-07-18 ENCOUNTER — Ambulatory Visit: Payer: Medicaid Other | Admitting: Pulmonary Disease

## 2023-07-18 DIAGNOSIS — N920 Excessive and frequent menstruation with regular cycle: Secondary | ICD-10-CM

## 2023-07-18 DIAGNOSIS — D219 Benign neoplasm of connective and other soft tissue, unspecified: Secondary | ICD-10-CM

## 2023-07-19 ENCOUNTER — Ambulatory Visit
Admission: RE | Admit: 2023-07-19 | Discharge: 2023-07-19 | Disposition: A | Discharge status: Home / Self Care | Payer: 59 | Source: Ambulatory Visit | Attending: Physician Assistant | Admitting: Physician Assistant

## 2023-07-19 ENCOUNTER — Encounter: Payer: 59 | Admitting: *Deleted

## 2023-07-19 DIAGNOSIS — J4540 Moderate persistent asthma, uncomplicated: Secondary | ICD-10-CM | POA: Diagnosis not present

## 2023-07-19 DIAGNOSIS — Z1231 Encounter for screening mammogram for malignant neoplasm of breast: Secondary | ICD-10-CM | POA: Diagnosis present

## 2023-07-19 NOTE — Progress Notes (Signed)
Daily Session Note  Patient Details  Name: Kathy Vega MRN: [id number] Date of Birth: [birth year] Referring Provider:   Flowsheet Row Pulmonary Rehab from 01/26/2023 in [facility] Cardiac and Pulmonary Rehab  Referring Provider Sarina Ser MD       Encounter Date: 07/19/2023  Check In:  Session Check In - 07/19/23 1359       Check-In   Supervising physician immediately available to respond to emergencies See telemetry face sheet for immediately available ER MD    Location [facility]-Cardiac & Pulmonary Rehab    Staff Present Rory Percy, MS, Exercise Physiologist;Zamyiah Tino Jewel Baize, RN Atilano Median, RN, ADN    Virtual Visit No    Medication changes reported     No    Fall or balance concerns reported    No    Warm-up and Cool-down Performed on first and last piece of equipment    Resistance Training Performed Yes    VAD Patient? No    PAD/SET Patient? No      Pain Assessment   Currently in Pain? No/denies                Social History   Tobacco Use  Smoking Status Former   Current packs/day: 0.00   Average packs/day: 0.5 packs/day for 5.0 years (2.5 ttl pk-yrs)   Types: Cigarettes   Start date: 14   Quit date: 2000   Years since quitting: 24.6  Smokeless Tobacco Never    Goals Met:  Independence with exercise equipment Exercise tolerated well No report of concerns or symptoms today Strength training completed today  Goals Unmet:  Not Applicable  Comments: Pt able to follow exercise prescription today without complaint.  Will continue to monitor for progression.    Dr. Bethann Punches is Medical Director for [facility] Cardiac Rehabilitation.  Dr. Vida Rigger is Medical Director for [facility] Pulmonary Rehabilitation.

## 2023-07-20 ENCOUNTER — Other Ambulatory Visit: Payer: Self-pay | Admitting: Medical Genetics

## 2023-07-20 ENCOUNTER — Inpatient Hospital Stay: Payer: 59

## 2023-07-20 VITALS — BP 112/79 | HR 77 | Temp 97.8°F | Resp 16

## 2023-07-20 DIAGNOSIS — D509 Iron deficiency anemia, unspecified: Secondary | ICD-10-CM | POA: Diagnosis not present

## 2023-07-20 DIAGNOSIS — E611 Iron deficiency: Secondary | ICD-10-CM

## 2023-07-20 DIAGNOSIS — Z006 Encounter for examination for normal comparison and control in clinical research program: Secondary | ICD-10-CM

## 2023-07-20 MED ORDER — SODIUM CHLORIDE 0.9 % IV SOLN
Freq: Once | INTRAVENOUS | Status: AC
  Filled 2023-07-20: qty 250

## 2023-07-20 MED ORDER — SODIUM CHLORIDE 0.9 % IV SOLN
200.0000 mg | Freq: Once | INTRAVENOUS | Status: AC
  Administered 2023-07-20: 200 mg via INTRAVENOUS
  Filled 2023-07-20: qty 200

## 2023-07-20 NOTE — Patient Instructions (Signed)
 Iron Sucrose Injection What is this medication? IRON SUCROSE (EYE ern SOO krose) treats low levels of iron (iron deficiency anemia) in people with kidney disease. Iron is a mineral that plays an important role in making red blood cells, which carry oxygen from your lungs to the rest of your body. This medicine may be used for other purposes; ask your health care provider or pharmacist if you have questions. COMMON BRAND NAME(S): Venofer What should I tell my care team before I take this medication? They need to know if you have any of these conditions: Anemia not caused by low iron levels Heart disease High levels of iron in the blood Kidney disease Liver disease An unusual or allergic reaction to iron, other medications, foods, dyes, or preservatives Pregnant or trying to get pregnant Breastfeeding How should I use this medication? This medication is for infusion into a vein. It is given in a hospital or clinic setting. Talk to your care team about the use of this medication in children. While this medication may be prescribed for children as young as 2 years for selected conditions, precautions do apply. Overdosage: If you think you have taken too much of this medicine contact a poison control center or emergency room at once. NOTE: This medicine is only for you. Do not share this medicine with others. What if I miss a dose? Keep appointments for follow-up doses. It is important not to miss your dose. Call your care team if you are unable to keep an appointment. What may interact with this medication? Do not take this medication with any of the following: Deferoxamine Dimercaprol Other iron products This medication may also interact with the following: Chloramphenicol Deferasirox This list may not describe all possible interactions. Give your health care provider a list of all the medicines, herbs, non-prescription drugs, or dietary supplements you use. Also tell them if you smoke,  drink alcohol, or use illegal drugs. Some items may interact with your medicine. What should I watch for while using this medication? Visit your care team regularly. Tell your care team if your symptoms do not start to get better or if they get worse. You may need blood work done while you are taking this medication. You may need to follow a special diet. Talk to your care team. Foods that contain iron include: whole grains/cereals, dried fruits, beans, or peas, leafy green vegetables, and organ meats (liver, kidney). What side effects may I notice from receiving this medication? Side effects that you should report to your care team as soon as possible: Allergic reactions--skin rash, itching, hives, swelling of the face, lips, tongue, or throat Low blood pressure--dizziness, feeling faint or lightheaded, blurry vision Shortness of breath Side effects that usually do not require medical attention (report to your care team if they continue or are bothersome): Flushing Headache Joint pain Muscle pain Nausea Pain, redness, or irritation at injection site This list may not describe all possible side effects. Call your doctor for medical advice about side effects. You may report side effects to FDA at 1-800-FDA-1088. Where should I keep my medication? This medication is given in a hospital or clinic. It will not be stored at home. NOTE: This sheet is a summary. It may not cover all possible information. If you have questions about this medicine, talk to your doctor, pharmacist, or health care provider.  2024 Elsevier/Gold Standard (2023-04-21 00:00:00)

## 2023-07-20 NOTE — Progress Notes (Signed)
Declined 30 minute post observation. Aware of risks. Vitals stable at discharge.  

## 2023-07-26 ENCOUNTER — Encounter: Payer: 59 | Admitting: *Deleted

## 2023-07-26 DIAGNOSIS — J4540 Moderate persistent asthma, uncomplicated: Secondary | ICD-10-CM | POA: Diagnosis not present

## 2023-07-26 NOTE — Progress Notes (Signed)
Daily Session Note  Patient Details  Name: Kathy Vega MRN: [id number] Date of Birth: 1976/04/25 Referring Provider:   Flowsheet Row Pulmonary Rehab from 01/26/2023 in [facility] Cardiac and Pulmonary Rehab  Referring Provider Sarina Ser MD       Encounter Date: 07/26/2023  Check In:  Session Check In - 07/26/23 1421       Check-In   Supervising physician immediately available to respond to emergencies See telemetry face sheet for immediately available ER MD    Location [facility]-Cardiac & Pulmonary Rehab    Staff Present Bess Kinds RN, Atilano Median, RN, Bobetta Lime, RCP,RRT,BSRT    Virtual Visit No    Medication changes reported     No    Fall or balance concerns reported    No    Tobacco Cessation No Change    Warm-up and Cool-down Performed on first and last piece of equipment    Resistance Training Performed Yes    VAD Patient? No    PAD/SET Patient? No      Pain Assessment   Currently in Pain? No/denies                Social History   Tobacco Use  Smoking Status Former   Current packs/day: 0.00   Average packs/day: 0.5 packs/day for 5.0 years (2.5 ttl pk-yrs)   Types: Cigarettes   Start date: 20   Quit date: 2000   Years since quitting: 24.6  Smokeless Tobacco Never    Goals Met:  Independence with exercise equipment Exercise tolerated well No report of concerns or symptoms today Strength training completed today  Goals Unmet:  Not Applicable  Comments: Pt able to follow exercise prescription today without complaint.  Will continue to monitor for progression.    Dr. Bethann Punches is Medical Director for [facility] Cardiac Rehabilitation.  Dr. Vida Rigger is Medical Director for [facility] Pulmonary Rehabilitation.

## 2023-08-01 ENCOUNTER — Ambulatory Visit: Payer: 59 | Admitting: Pulmonary Disease

## 2023-08-02 ENCOUNTER — Encounter: Payer: Self-pay | Admitting: *Deleted

## 2023-08-02 DIAGNOSIS — J4540 Moderate persistent asthma, uncomplicated: Secondary | ICD-10-CM

## 2023-08-02 NOTE — Progress Notes (Signed)
Pulmonary Individual Treatment Plan  Patient Details  Name: Kathy Vega MRN: [id number] Date of Birth: August 07, 1976 Referring Provider:   Flowsheet Row Pulmonary Rehab from 01/26/2023 in [facility] Card[facility] and Pulmonary Rehab  Referring Provider Sarina Ser MD       Initial Encounter Date:  Flowsheet Row Pulmonary Rehab from 01/26/2023 in [facility] Cardiac and Pulmonary Rehab  Date 01/26/23       Visit Diagnosis: Moderate persistent asthma in adult without complication  Patient's Home Medications on Admission:  Current Outpatient Medications:    acetaminophen (TYLENOL) 500 MG tablet, Take 500 mg by mouth every 6 (six) hours as needed for headache, fever, moderate pain or mild pain., Disp: , Rfl:    albuterol (PROVENTIL) (2.5 MG/3ML) 0.083% nebulizer solution, Take 3 mLs (2.5 mg total) by nebulization every 6 (six) hours as needed for wheezing or shortness of breath., Disp: 360 mL, Rfl: 11   ascorbic acid (VITAMIN C) 500 MG tablet, Take 1 tablet (500 mg total) by mouth daily., Disp: 30 tablet, Rfl: 0   bisoprolol (ZEBETA) 5 MG tablet, Take 1 tablet (5 mg total) by mouth daily., Disp: 30 tablet, Rfl: 6   budesonide (PULMICORT) 0.5 MG/2ML nebulizer solution, Take 2 mLs (0.5 mg total) by nebulization 2 (two) times daily., Disp: 120 mL, Rfl: 5   formoterol (PERFOROMIST) 20 MCG/2ML nebulizer solution, Take 2 mLs (20 mcg total) by nebulization 2 (two) times daily. Use before the budesonide, twice a day., Disp: 120 mL, Rfl: 11   furosemide (LASIX) 20 MG tablet, Take 1 tablet (20 mg total) by mouth daily as needed for edema (shortness of breath)., Disp: 90 tablet, Rfl: 3   HYDROcodone-acetaminophen (NORCO/VICODIN) 5-325 MG tablet, Take 1 tablet by mouth every 4 (four) hours as needed for moderate pain., Disp: 12 tablet, Rfl: 0   hydrOXYzine (ATARAX) 10 MG tablet, Take 10 mg by mouth 3 (three) times daily., Disp: , Rfl:    iron polysaccharides (NIFEREX) 150 MG capsule, Take 1 capsule (150 mg total) by  mouth daily., Disp: 30 capsule, Rfl: 1   levothyroxine (SYNTHROID) 125 MCG tablet, Take 250 mcg by mouth daily., Disp: , Rfl:    meloxicam (MOBIC) 15 MG tablet, Take 1 tablet (15 mg total) by mouth daily., Disp: 30 tablet, Rfl: 0   methocarbamol (ROBAXIN) 500 MG tablet, Take 1 tablet (500 mg total) by mouth 4 (four) times daily., Disp: 30 tablet, Rfl: 0   norethindrone (AYGESTIN) 5 MG tablet, Take 2 tablets (10 mg total) by mouth daily., Disp: 30 tablet, Rfl: 1   nystatin cream (MYCOSTATIN), Apply topically 2 (two) times daily., Disp: 30 g, Rfl: 0   OXYGEN, Inhale into the lungs at bedtime as needed. Up to 4 liters, Disp: , Rfl:    pantoprazole (PROTONIX) 40 MG tablet, Take 1 tablet (40 mg total) by mouth daily., Disp: 30 tablet, Rfl: 2   propranolol (INDERAL) 10 MG tablet, Take 1 tablet (10 mg total) by mouth 3 (three) times daily as needed (palpitations)., Disp: 90 tablet, Rfl: 6  Past Medical History: Past Medical History:  Diagnosis Date   Acute encephalopathy 08/13/2020   Acute renal failure (ARF) (HCC) 08/13/2020   Anemia    Asthma    Cervical cancer (HCC)    Hypothyroidism    Iron deficiency anemia    Obesity (BMI 30-39.9)    Pneumonia due to COVID-19 virus 08/13/2020   requiring trach and prolonged hospitalization   Subglottic stenosis    Tracheal stenosis    Tracheostomy  tube present (HCC)     Tobacco Use: Social History   Tobacco Use  Smoking Status Former   Current packs/day: 0.00   Average packs/day: 0.5 packs/day for 5.0 years (2.5 ttl pk-yrs)   Types: Cigarettes   Start date: 68   Quit date: 2000   Years since quitting: 24.6  Smokeless Tobacco Never    Labs: Review Flowsheet  More data exists      Latest Ref Rng & Units 08/16/2020 08/17/2020 08/18/2020 08/20/2020 08/31/2020  Labs for ITP Cardiac and Pulmonary Rehab  Trlycerides <150 mg/dL - 8,657  - - -  PH, Arterial 7.350 - 7.450 7.40  - 7.41  7.38  7.42  7.47   PCO2 arterial 32.0 - 48.0 mmHg 44  - 49  56   48  53   Bicarbonate 20.0 - 28.0 mmol/L 27.3  - 31.1  33.1  31.1  38.6   O2 Saturation % 98.8  - 92.3  94.6  93.1  92.5     Details       Multiple values from one day are sorted in reverse-chronological order          Pulmonary Assessment Scores:   UCSD: Self-administered rating of dyspnea associated with activities of daily living (ADLs) 6-point scale (0 = "not at all" to 5 = "maximal or unable to do because of breathlessness")  Scoring Scores range from 0 to 120.  Minimally important difference is 5 units  CAT: CAT can identify the health impairment of COPD patients and is better correlated with disease progression.  CAT has a scoring range of zero to 40. The CAT score is classified into four groups of low (less than 10), medium (10 - 20), high (21-30) and very high (31-40) based on the impact level of disease on health status. A CAT score over 10 suggests significant symptoms.  A worsening CAT score could be explained by an exacerbation, poor medication adherence, poor inhaler technique, or progression of COPD or comorbid conditions.  CAT MCID is 2 points  mMRC: mMRC (Modified Medical Research Council) Dyspnea Scale is used to assess the degree of baseline functional disability in patients of respiratory disease due to dyspnea. No minimal important difference is established. A decrease in score of 1 point or greater is considered a positive change.   Pulmonary Function Assessment:   Exercise Target Goals: Exercise Program Goal: Individual exercise prescription set using results from initial 6 min walk test and THRR while considering  patient's activity barriers and safety.   Exercise Prescription Goal: Initial exercise prescription builds to 30-45 minutes a day of aerobic activity, 2-3 days per week.  Home exercise guidelines will be given to patient during program as part of exercise prescription that the participant will acknowledge.  Education: Aerobic Exercise: -  Group verbal and visual presentation on the components of exercise prescription. Introduces F.I.T.T principle from ACSM for exercise prescriptions.  Reviews F.I.T.T. principles of aerobic exercise including progression. Written material given at graduation.   Education: Resistance Exercise: - Group verbal and visual presentation on the components of exercise prescription. Introduces F.I.T.T principle from ACSM for exercise prescriptions  Reviews F.I.T.T. principles of resistance exercise including progression. Written material given at graduation.    Education: Exercise & Equipment Safety: - Individual verbal instruction and demonstration of equipment use and safety with use of the equipment. Flowsheet Row Pulmonary Rehab from 04/12/2023 in [facility] Cardiac and Pulmonary Rehab  Date 01/16/23  Educator [facility]  Instruction Review Code 1- Verbalizes Understanding  Education: Exercise Physiology & General Exercise Guidelines: - Group verbal and written instruction with models to review the exercise physiology of the cardiovascular system and associated critical values. Provides general exercise guidelines with specific guidelines to those with heart or lung disease.    Education: Flexibility, Balance, Mind/Body Relaxation: - Group verbal and visual presentation with interactive activity on the components of exercise prescription. Introduces F.I.T.T principle from ACSM for exercise prescriptions. Reviews F.I.T.T. principles of flexibility and balance exercise training including progression. Also discusses the mind body connection.  Reviews various relaxation techniques to help reduce and manage stress (i.e. Deep breathing, progressive muscle relaxation, and visualization). Balance handout provided to take home. Written material given at graduation.   Activity Barriers & Risk Stratification:   6 Minute Walk:  Oxygen Initial Assessment:   Oxygen Re-Evaluation:  Oxygen Re-Evaluation     Row  Name 03/13/23 1400 04/10/23 1403 05/03/23 1435 05/29/23 1412       Program Oxygen Prescription   Program Oxygen Prescription -- Continuous None None    Liters per minute -- 4 -- --    Comments -- patient wanted to try room air for exercise today. now staying on room air and saturation are staying above 90% --      Home Oxygen   Home Oxygen Device -- E-Tanks;Home Concentrator None;Home Concentrator  not needing currently None;Home Concentrator    Sleep Oxygen Prescription -- Continuous None None    Liters per minute -- 5 -- --    Home Exercise Oxygen Prescription -- -- None None    Liters per minute -- 4 -- --    Home Resting Oxygen Prescription -- Continuous None None    Liters per minute -- 4 -- --    Compliance with Home Oxygen Use -- Yes Yes --      Goals/Expected Outcomes   Short Term Goals To learn and exhibit compliance with exercise, home and travel O2 prescription;To learn and understand importance of monitoring SPO2 with pulse oximeter and demonstrate accurate use of the pulse oximeter.;To learn and demonstrate proper pursed lip breathing techniques or other breathing techniques. ;To learn and understand importance of maintaining oxygen saturations>88%;To learn and demonstrate proper use of respiratory medications To learn and demonstrate proper pursed lip breathing techniques or other breathing techniques.  To learn and exhibit compliance with exercise, home and travel O2 prescription;To learn and understand importance of monitoring SPO2 with pulse oximeter and demonstrate accurate use of the pulse oximeter.;To learn and understand importance of maintaining oxygen saturations>88%;To learn and demonstrate proper pursed lip breathing techniques or other breathing techniques.  To learn and understand importance of monitoring SPO2 with pulse oximeter and demonstrate accurate use of the pulse oximeter.;To learn and understand importance of maintaining oxygen saturations>88%    Long  Term  Goals Exhibits compliance with exercise, home  and travel O2 prescription;Maintenance of O2 saturations>88%;Compliance with respiratory medication;Demonstrates proper use of MDI's;Exhibits proper breathing techniques, such as pursed lip breathing or other method taught during program session;Verbalizes importance of monitoring SPO2 with pulse oximeter and return demonstration Exhibits proper breathing techniques, such as pursed lip breathing or other method taught during program session Exhibits compliance with exercise, home  and travel O2 prescription;Verbalizes importance of monitoring SPO2 with pulse oximeter and return demonstration;Maintenance of O2 saturations>88%;Compliance with respiratory medication;Exhibits proper breathing techniques, such as pursed lip breathing or other method taught during program session Verbalizes importance of monitoring SPO2 with pulse oximeter and return demonstration;Maintenance of O2 saturations>88%    Comments  Reviewed PLB technique with pt.  Talked about how it works and it's importance in maintaining their exercise saturations. Diaphragmatic and PLB breathing explained and performed with patient. Patient has a better understanding of how to do these exercises to help with breathing performance and relaxation. Patient performed breathing techniques adequately and to practice further at home. Patient would like to titrate her oxygen today and see how her oxygen saturations are while exercising. Dynastie is doing well in rehab.  She is no longer requiring oxygen therapy to maintain her saturations.  She continues to check them and uses her nebulizer routinely at home.  She is good about using her PLB as well.  She wants to ask about getting her trach out and plans to ask her doctor about it at end of month.  She is worried about closing off if it comes out, but really doesn't want it the rest of her life. She/has a pulse oximeter to check her oxygen saturation at home. Informed  and explained why it is important to have one. Reviewed that oxygen saturations should be 88 percent and above. Patient verbalizes understanding.    Goals/Expected Outcomes Short: Become more profiecient at using PLB.   Long: Become independent at using PLB. Short: practice PLB and diaphragmatic breathing at home. Long: Use PLB, titrate oxygen and diaphragmatic breathing independently post [employer]. Short: Continue to monitor saturations Long: Talk to doctor about trach removal. Short: monitor oxygen at home with exertion. Long: maintain oxygen saturations above 88 percent independently.             Oxygen Discharge (Final Oxygen Re-Evaluation):  Oxygen Re-Evaluation - 05/29/23 1412       Program Oxygen Prescription   Program Oxygen Prescription None      Home Oxygen   Home Oxygen Device None;Home Concentrator    Sleep Oxygen Prescription None    Home Exercise Oxygen Prescription None    Home Resting Oxygen Prescription None      Goals/Expected Outcomes   Short Term Goals To learn and understand importance of monitoring SPO2 with pulse oximeter and demonstrate accurate use of the pulse oximeter.;To learn and understand importance of maintaining oxygen saturations>88%    Long  Term Goals Verbalizes importance of monitoring SPO2 with pulse oximeter and return demonstration;Maintenance of O2 saturations>88%    Comments She/has a pulse oximeter to check her oxygen saturation at home. Informed and explained why it is important to have one. Reviewed that oxygen saturations should be 88 percent and above. Patient verbalizes understanding.    Goals/Expected Outcomes Short: monitor oxygen at home with exertion. Long: maintain oxygen saturations above 88 percent independently.             Initial Exercise Prescription:   Perform Capillary Blood Glucose checks as needed.  Exercise Prescription Changes:   Exercise Prescription Changes     Row Name 03/16/23 1400 03/29/23 1300 04/12/23  1300 04/27/23 1500 05/10/23 0900     Response to Exercise   Blood Pressure (Admit) 132/80 136/82 148/80 110/72 134/62   Blood Pressure (Exercise) 158/70 138/70 128/70 144/76 148/80   Blood Pressure (Exit) 130/80 140/70 112/72 104/62 122/62   Heart Rate (Admit) 114 bpm 102 bpm 90 bpm 95 bpm 95 bpm   Heart Rate (Exercise) 110 bpm 134 bpm 128 bpm 128 bpm 124 bpm   Heart Rate (Exit) 106 bpm 109 bpm 105 bpm 105 bpm 102 bpm   Oxygen Saturation (Admit) 92 % 98 % 92 % 94 % 93 %  Oxygen Saturation (Exercise) 96 % 95 % 88 % 89 % 92 %   Oxygen Saturation (Exit) 97 % 97 % 94 % 96 % 94 %   Rating of Perceived Exertion (Exercise) 11 13 13 16 13    Perceived Dyspnea (Exercise) 1 3 1 3 2    Symptoms SOB SOB SOB SOB SOB   Comments First full day of exercise -- -- -- --   Duration Progress to 30 minutes of  aerobic without signs/symptoms of physical distress Progress to 30 minutes of  aerobic without signs/symptoms of physical distress Progress to 30 minutes of  aerobic without signs/symptoms of physical distress Continue with 30 min of aerobic exercise without signs/symptoms of physical distress. Continue with 30 min of aerobic exercise without signs/symptoms of physical distress.   Intensity THRR unchanged THRR unchanged THRR unchanged THRR unchanged THRR unchanged     Progression   Progression Continue to progress workloads to maintain intensity without signs/symptoms of physical distress. Continue to progress workloads to maintain intensity without signs/symptoms of physical distress. Continue to progress workloads to maintain intensity without signs/symptoms of physical distress. Continue to progress workloads to maintain intensity without signs/symptoms of physical distress. Continue to progress workloads to maintain intensity without signs/symptoms of physical distress.   Average METs 1.8 1.53 1.93 2.03 3.26     Resistance Training   Training Prescription Yes Yes Yes Yes Yes   Weight 3 lb  no overhead  exercises; relayed to patient 3 lb 3 lb 3 lb 3 lb   Reps 10-15 10-15 10-15 10-15 10-15     Interval Training   Interval Training -- -- No No No     Oxygen   Oxygen Continuous Continuous -- -- --   Liters 3 3  Trach collar RA RA RA     NuStep   Level -- -- -- 1 5   Minutes -- -- -- 15 30   METs -- -- -- 2.3 4.6     Arm Ergometer   Level -- -- -- 1 --   Minutes -- -- -- 15 --   METs -- -- -- 2.1 --     REL-XR   Level -- -- -- 4 5   Minutes -- -- -- 15 15   METs -- -- -- 1 3.6     T5 Nustep   Level 1 -- 2 -- --   Minutes 30 -- 15 -- --   METs 1.8 -- 2.2 -- --     Biostep-RELP   Level -- 1 -- 1 --   Minutes -- 15 -- 15 --   METs -- 2 -- 3 --     Track   Laps -- 10 12 18 8    Minutes -- 15 15 15 15    METs -- 1.54 1.65 1.98 1.44     Oxygen   Maintain Oxygen Saturation 88% or higher 88% or higher 88% or higher 88% or higher 88% or higher    Row Name 05/25/23 1400 06/10/23 1100 07/04/23 1500 07/17/23 0900 07/19/23 1400     Response to Exercise   Blood Pressure (Admit) 132/80 128/80 146/90 122/60 --   Blood Pressure (Exit) 136/78 122/78 146/98 120/98 --   Heart Rate (Admit) 90 bpm 92 bpm 93 bpm 80 bpm --   Heart Rate (Exercise) 118 bpm 115 bpm 122 bpm 118 bpm --   Heart Rate (Exit) 102 bpm 106 bpm 124 bpm 105 bpm --   Oxygen Saturation (Admit) 96 % 97 %  99 % 96 % --   Oxygen Saturation (Exercise) 92 % 95 % 91 % 93 % --   Oxygen Saturation (Exit) 95 % 95 % 94 % 93 % --   Rating of Perceived Exertion (Exercise) 13 13 13 13  --   Perceived Dyspnea (Exercise) 2 2 2 1  --   Symptoms SOB SOB SOB SOB --   Duration Continue with 30 min of aerobic exercise without signs/symptoms of physical distress. Continue with 30 min of aerobic exercise without signs/symptoms of physical distress. Continue with 30 min of aerobic exercise without signs/symptoms of physical distress. Continue with 30 min of aerobic exercise without signs/symptoms of physical distress. --   Intensity THRR  unchanged THRR unchanged THRR unchanged THRR unchanged --     Progression   Progression Continue to progress workloads to maintain intensity without signs/symptoms of physical distress. Continue to progress workloads to maintain intensity without signs/symptoms of physical distress. Continue to progress workloads to maintain intensity without signs/symptoms of physical distress. Continue to progress workloads to maintain intensity without signs/symptoms of physical distress. --   Average METs 2.24 2.35 2.09 3.55 --     Resistance Training   Training Prescription Yes Yes Yes Yes --   Weight 3 lb 3 lb 4 lb 4 lb --   Reps 10-15 10-15 10-15 10-15 --     Interval Training   Interval Training No No No No --     Oxygen   Liters RA RA RA RA --     NuStep   Level -- 3 -- -- --   Minutes -- 15 -- -- --   METs -- 2.6 -- -- --     Arm Ergometer   Level -- 1 1.5 1.5 --   Minutes -- 15 15 15  --   METs -- 2.6 1 2.4 --     REL-XR   Level 5 -- -- 6 --   Minutes 15 -- -- 15 --   METs 3 -- -- 5.8 --     T5 Nustep   Level 2 -- -- -- --   Minutes 30 -- -- -- --   METs 2.2 -- -- -- --     Biostep-RELP   Level -- -- 3 3 --   Minutes -- -- 15 15 --   METs -- -- 3 3 --     Track   Laps 18 20 28  42 --   Minutes 15 15 15 15  --   METs 1.98 2.09 2.52 3.28 --     Home Exercise Plan   Plans to continue exercise at -- -- -- -- Home (comment)  Walking and stretching at home in neighborhood, potentially joining [employer]   Frequency -- -- -- -- Add 2 additional days to program exercise sessions.   Initial Home Exercises Provided -- -- -- -- 07/19/23     Oxygen   Maintain Oxygen Saturation 88% or higher 88% or higher 88% or higher 88% or higher 88% or higher    Row Name 08/01/23 1400             Response to Exercise   Blood Pressure (Admit) 106/60       Blood Pressure (Exit) 116/60       Heart Rate (Admit) 111 bpm       Heart Rate (Exercise) 130 bpm       Heart Rate (Exit) 105 bpm        Oxygen Saturation (Admit) 95 %  Oxygen Saturation (Exercise) 94 %       Oxygen Saturation (Exit) 98 %       Rating of Perceived Exertion (Exercise) 13       Perceived Dyspnea (Exercise) 2       Symptoms SOB       Duration Continue with 30 min of aerobic exercise without signs/symptoms of physical distress.       Intensity THRR unchanged         Progression   Progression Continue to progress workloads to maintain intensity without signs/symptoms of physical distress.       Average METs 2.17         Resistance Training   Training Prescription Yes       Weight 4 lb       Reps 10-15         Interval Training   Interval Training No         NuStep   Level 5       Minutes 15       METs 3.8         Arm Ergometer   Level 4       Minutes 15       METs 1         REL-XR   Level 5       Minutes 15         Track   Laps 20       Minutes 15       METs 2.09         Home Exercise Plan   Plans to continue exercise at Home (comment)  Walking and stretching at home in neighborhood, potentially joining [employer]       Frequency Add 2 additional days to program exercise sessions.       Initial Home Exercises Provided 07/19/23         Oxygen   Maintain Oxygen Saturation 88% or higher                Exercise Comments:   Exercise Comments     Row Name 03/13/23 1400           Exercise Comments First full day of exercise!  Patient was oriented to gym and equipment including functions, settings, policies, and procedures.  Patient's individual exercise prescription and treatment plan were reviewed.  All starting workloads were established based on the results of the 6 minute walk test done at initial orientation visit.  The plan for exercise progression was also introduced and progression will be customized based on patient's performance and goals.                Exercise Goals and Review:   Exercise Goals Re-Evaluation :  Exercise Goals Re-Evaluation     Row  Name 03/13/23 1400 03/16/23 1451 03/29/23 1341 04/12/23 1329 04/27/23 1541     Exercise Goal Re-Evaluation   Exercise Goals Review Increase Physical Activity;Able to understand and use rate of perceived exertion (RPE) scale;Knowledge and understanding of Target Heart Rate Range (THRR);Understanding of Exercise Prescription;Increase Strength and Stamina;Able to check pulse independently;Able to understand and use Dyspnea scale Increase Physical Activity;Increase Strength and Stamina;Understanding of Exercise Prescription Increase Physical Activity;Increase Strength and Stamina;Understanding of Exercise Prescription Increase Physical Activity;Increase Strength and Stamina;Understanding of Exercise Prescription Increase Physical Activity;Increase Strength and Stamina;Understanding of Exercise Prescription   Comments Reviewed RPE scale, THR and program prescription with pt today.  Pt voiced understanding and was given a  copy of goals to take home. Sachi is off to a good start in rehab. During her first session she was able to work at level 1 on the T5 nustep. She also did well with 3 lb hand weights for resistance training. We will continue to monitor her progress in the program. Rodney Booze completed 2 more sessions since last review, she was not able to attend the other sessions as she had to call out. She tried out the Biostep at level 1 and was able to walk 10 laps on the track! Patient has a trach to work with during exercise and has back up supplies with her here. Her oxygen saturations are staying nicely above 88%. We will continue to monitor, Annabelle has only completed one session since the last review and has been inconsistent with her attendance. She did perform her exercise without oxygen during his last session and did well on room air. She was able to keep her O2 saturation above 88% as well. She also improved to level 2 on the T5 nustep and walked up to 12 laps on the track. We will continue to monitor her  progress in the program. Cindy is doing well in rehab. She recently increased her overall average MET level to 2.03 METs. She also has continued to do well with exercise without oxygen, and her O2 saturation has only dropped below 90% once since the last review. She walked up to 18 laps as well, and improved to level 4 on the XR. We will continue to monitor her progress in the program.   Expected Outcomes Short: Use RPE daily to regulate intensity.  Long: Follow program prescription in THR. Short: Continue to attend rehab consistently. Long: Continue to follow current exercise prescription. Short: Continue working in Engineer, manufacturing systems on track to increase laps Long: Continue to increae overall MET level and stamina Short: Attend rehab more consistently, progressively increase workloads. Long: Continue to improve strength and stamina. Short: Continue to progressively increase workloads. Long: Continue to increase overall MET level and stamina.    Row Name 05/03/23 1427 05/10/23 0931 05/25/23 1417 06/10/23 1122 07/04/23 1517     Exercise Goal Re-Evaluation   Exercise Goals Review Increase Physical Activity;Increase Strength and Stamina;Understanding of Exercise Prescription Increase Physical Activity;Increase Strength and Stamina;Understanding of Exercise Prescription Increase Physical Activity;Increase Strength and Stamina;Understanding of Exercise Prescription Increase Physical Activity;Increase Strength and Stamina;Understanding of Exercise Prescription Increase Physical Activity;Increase Strength and Stamina;Understanding of Exercise Prescription   Comments Jovona is doing well in rehab. She is exercising some on her off days from rehab by walking.  She is doing well with it; her breathing is her biggest limitation, but it is doing well.  She has noticed that her stamina is getting better with being in rehab. Dontia continues to do well in rehab. She was able to improve to level 5 on both the T4 nustep and the  XR. She also has continued to do well walking on the track without oxygen, as her O2 saturations have not dropped below 92%. She also increased her overall average MET level to 3.26 METs. We will continue to monitor her progress in the program. Calisha is doing well in rehab. She continues to work at level 5 on the XR and level 2 on the T5 nustep. She also increased her laps on the track back up to 18 laps. We will continue to monitor her progress in the program. Miosha continues to work on increasein workloads and keeping her RPE and THRR  within her parameters. She increased the T5 Nustep from level 2 to level 3 this revciew period. Increased her laps by 2. REmains using 3 lb hand weights. Encourage her to increase the T4 NUstep by 1-2 levels.  Will continue to monitor exercise progression with goal of increased stamina and strength. Shakeima continues to do well in rehab. She walked up to 28 laps on the track and increased her biostep level to 3. She also increased from 3 lbs to 4 lbs for resistance training. We will continue to monitor her progress in the program.   Expected Outcomes Short: Continue to walk on off days Long: Continue to improve stamina Short: Continue to push for more laps on the track. Long: Continue to improve strength and stamina. Short: Continue to push for more laps on the track. Long: Continue to improve strength and stamina. [id number] workloads as tolerated with goal of increased stamina and strength. Continue to encourage slow progression.   LTG: Continued exercise progression as tolerated during program and after discharge. Short: Continue to push for more laps on the track. Long: Continue exercise to improve strength and stamina.    Row Name 07/17/23 9811 07/19/23 1446 08/01/23 1436         Exercise Goal Re-Evaluation   Exercise Goals Review Increase Physical Activity;Increase Strength and Stamina;Understanding of Exercise Prescription Increase Physical Activity;Able to understand  and use Dyspnea scale;Understanding of Exercise Prescription;Knowledge and understanding of Target Heart Rate Range (THRR);Increase Strength and Stamina;Able to check pulse independently;Able to understand and use rate of perceived exertion (RPE) scale Increase Physical Activity;Increase Strength and Stamina;Understanding of Exercise Prescription     Comments Raeven continues to do well in rehab. She increased her track laps from 28 to 42, she also increased her level on the REXR from level 5 to 6. Improvements were also seen in percieved Dyspnea, and her overall average MET's increased from 2.09 last review to 3.55. We will continue to monitor her progress in the program. Reviewed home exercise with pt today.  Pt plans to walk at home around her neighborhood and stretch for exercise.  She also may join the [facility].  Reviewed THR, pulse, RPE, sign and symptoms, pulse oximetery and when to call 911 or MD.  Also discussed weather considerations and indoor options.  Pt voiced understanding. Yazmeen continues to do well in rehab. She recently increased her intensity on the upper body ergometer from 1.5 to 4. We will continue to monitor her progress in the program.     Expected Outcomes Short: Continue to follow current exercise prescription, and progressively increase workloads. Long: Continue exercise to improve strength and stamina. Short: Walk at home on days away from rehab. Long: Continue to exercise independently. Short: Continue to follow current exercise prescription, and progressively increase workloads. Long: Continue exercise to improve strength and stamina.              Discharge Exercise Prescription (Final Exercise Prescription Changes):  Exercise Prescription Changes - 08/01/23 1400       Response to Exercise   Blood Pressure (Admit) 106/60    Blood Pressure (Exit) 116/60    Heart Rate (Admit) 111 bpm    Heart Rate (Exercise) 130 bpm    Heart Rate (Exit) 105 bpm    Oxygen Saturation  (Admit) 95 %    Oxygen Saturation (Exercise) 94 %    Oxygen Saturation (Exit) 98 %    Rating of Perceived Exertion (Exercise) 13    Perceived Dyspnea (Exercise) 2  Symptoms SOB    Duration Continue with 30 min of aerobic exercise without signs/symptoms of physical distress.    Intensity THRR unchanged      Progression   Progression Continue to progress workloads to maintain intensity without signs/symptoms of physical distress.    Average METs 2.17      Resistance Training   Training Prescription Yes    Weight 4 lb    Reps 10-15      Interval Training   Interval Training No      NuStep   Level 5    Minutes 15    METs 3.8      Arm Ergometer   Level 4    Minutes 15    METs 1      REL-XR   Level 5    Minutes 15      Track   Laps 20    Minutes 15    METs 2.09      Home Exercise Plan   Plans to continue exercise at Home (comment)   Walking and stretching at home in neighborhood, potentially joining [facility]   Frequency Add 2 additional days to program exercise sessions.    Initial Home Exercises Provided 07/19/23      Oxygen   Maintain Oxygen Saturation 88% or higher             Nutrition:  Target Goals: Understanding of nutrition guidelines, daily intake of sodium 1500mg , cholesterol 200mg , calories 30% from fat and 7% or less from saturated fats, daily to have 5 or more servings of fruits and vegetables.  Education: All About Nutrition: -Group instruction provided by verbal, written material, interactive activities, discussions, models, and posters to present general guidelines for heart healthy nutrition including fat, fiber, MyPlate, the role of sodium in heart healthy nutrition, utilization of the nutrition label, and utilization of this knowledge for meal planning. Follow up email sent as well. Written material given at graduation.   Biometrics:    Nutrition Therapy Plan and Nutrition Goals:  Nutrition Therapy & Goals - 06/21/23 1554        Nutrition Therapy   Diet Mediterranean    Protein (specify units) 90    Fiber 25 grams    Whole Grain Foods 3 servings    Saturated Fats 15 max. grams    Fruits and Vegetables 5 servings/day    Sodium 2 grams      Personal Nutrition Goals   Nutrition Goal Eat 3 times per day (carbs paired with protein)    Personal Goal #2 Limit fried foods    Comments She is drinking mostly vitamin water, some soda. Talked to her about ensuring she keep soda to a limit and meet a goal of ~64oz of water daily. She doesn't feel hungry most of the day. Will miss breakfast almost every day, dinner most days. Lunch is not always big in size but can at times be high calorie foods like pizza, fried chicken, ect. Educated on the Mediterranean diet and ways to eat more healthy foods, reduce bad fats, refined sugars, and sodium. But main focus is on getting enough nutrition with her small appetite. Build out several small meals and snacks centered around nutrient and calorie dense foods to help her meet her nutrition goals with as few bites as needed.      Intervention Plan   Intervention Nutrition handout(s) given to patient.;Prescribe, educate and counsel regarding individualized specific dietary modifications aiming towards targeted core components such as weight, hypertension,  lipid management, diabetes, heart failure and other comorbidities.    Expected Outcomes Long Term Goal: Adherence to prescribed nutrition plan.;Short Term Goal: A plan has been developed with personal nutrition goals set during dietitian appointment.;Short Term Goal: Understand basic principles of dietary content, such as calories, fat, sodium, cholesterol and nutrients.             Nutrition Assessments:  MEDIFICTS Score Key: ?70 Need to make dietary changes  40-70 Heart Healthy Diet ? 40 Therapeutic Level Cholesterol Diet  Flowsheet Row Pulmonary Rehab from 01/26/2023 in [facility] Card[facility] and Pulmonary Rehab  Picture Your Plate Total  Score on Admission 47      Picture Your Plate Scores: <81 Unhealthy dietary pattern with much room for improvement. 41-50 Dietary pattern unlikely to meet recommendations for good health and room for improvement. 51-60 More healthful dietary pattern, with some room for improvement.  >60 Healthy dietary pattern, although there may be some specific behaviors that could be improved.   Nutrition Goals Re-Evaluation:  Nutrition Goals Re-Evaluation     Row Name 04/10/23 1406 05/03/23 1431 05/29/23 1407 06/19/23 1407       Goals   Current Weight 274 lb (124.3 kg) -- 264 lb (119.7 kg) 266 lb (120.7 kg)    Nutrition Goal eat smaller portions. work on Mining engineer with RD Meet with RD    Comment Patient was informed on why it is important to maintain a balanced diet when dealing with Respiratory issues. Explained that it takes a lot of energy to breath and when they are short of breath often they will need to have a good diet to help keep up with the calories they are expending for breathing. Rodney Booze declined dietitian appt. Her weight is going down.  She is eating more fruit and cut back on portion sizes.  She is trying to do better with eating smaller meals. Bexleigh would like to meet with RD Patient is talking to RD to set up for an appointment.    Expected Outcome Short: Choose and plan snacks accordingly to patients caloric intake to improve breathing. Long: Maintain a diet independently that meets their caloric intake to aid in daily shortness of breath. Short; Continue to add in more fruits Long: Continue to improve diet Short: Meet with RD. Long: adhere to a diet plan that pertains to her. Short: meet with RD and lose weight. Long: lose weight and follow RD guidlines.             Nutrition Goals Discharge (Final Nutrition Goals Re-Evaluation):  Nutrition Goals Re-Evaluation - 06/19/23 1407       Goals   Current Weight 266 lb (120.7 kg)    Nutrition Goal Meet with RD    Comment Patient  is talking to RD to set up for an appointment.    Expected Outcome Short: meet with RD and lose weight. Long: lose weight and follow RD guidlines.             Psychosocial: Target Goals: Acknowledge presence or absence of significant depression and/or stress, maximize coping skills, provide positive support system. Participant is able to verbalize types and ability to use techniques and skills needed for reducing stress and depression.   Education: Stress, Anxiety, and Depression - Group verbal and visual presentation to define topics covered.  Reviews how body is impacted by stress, anxiety, and depression.  Also discusses healthy ways to reduce stress and to treat/manage anxiety and depression.  Written material given at graduation.  Education: Sleep Hygiene -Provides group verbal and written instruction about how sleep can affect your health.  Define sleep hygiene, discuss sleep cycles and impact of sleep habits. Review good sleep hygiene tips.    Initial Review & Psychosocial Screening:   Quality of Life Scores:  Scores of 19 and below usually indicate a poorer quality of life in these areas.  A difference of  2-3 points is a clinically meaningful difference.  A difference of 2-3 points in the total score of the Quality of Life Index has been associated with significant improvement in overall quality of life, self-image, physical symptoms, and general health in studies assessing change in quality of life.  PHQ-9: Review Flowsheet       05/29/2023 04/10/2023 01/26/2023  Depression screen PHQ 2/9  Decreased Interest 2 2 3   Down, Depressed, Hopeless 0 0 1  PHQ - 2 Score 2 2 4   Altered sleeping 0 0 0  Tired, decreased energy 1 3 3   Change in appetite 1 0 0  Feeling bad or failure about yourself  0 0 0  Trouble concentrating 0 0 0  Moving slowly or fidgety/restless 0 0 0  Suicidal thoughts 0 0 0  PHQ-9 Score 4 5 7   Difficult doing work/chores Somewhat difficult Somewhat  difficult Somewhat difficult    Details           Interpretation of Total Score  Total Score Depression Severity:  1-4 = Minimal depression, 5-9 = Mild depression, 10-14 = Moderate depression, 15-19 = Moderately severe depression, 20-27 = Severe depression   Psychosocial Evaluation and Intervention:   Psychosocial Re-Evaluation:  Psychosocial Re-Evaluation     Row Name 04/10/23 1411 05/03/23 1429 05/29/23 1417 06/19/23 1409       Psychosocial Re-Evaluation   Current issues with History of Depression History of Depression History of Depression None Identified    Comments Reviewed patient health questionnaire (PHQ-9) with patient for follow up. Previously, patients score indicated signs/symptoms of depression.  Reviewed to see if patient is improving symptom wise while in program.  Score improved and patient states that it is because she has been able to control herhealth more. Raelin is doing well in rehab. She is feeling better mentally and in a better place with her mood since when she was first released.  She is sleeping well. Reviewed patient health questionnaire (PHQ-9) with patient for follow up. Previously, patients score indicated signs/symptoms of depression.  Reviewed to see if patient is improving symptom wise while in program.  Score improved and patient states that it is because she has been able to have more energy. Patient reports no issues with their current mental states, sleep, stress, depression or anxiety. Will follow up with patient in a few weeks for any changes.    Expected Outcomes Short: Continue to attend [facility] regularly for regular exercise and social engagement. Long: Continue to improve symptoms and manage a positive mental state. Short: Conitnue to exercise for mental boost LOng; Continue to stay positive Short: Continue to attend [facility] regularly for regular exercise and social engagement. Long: Continue to improve symptoms and manage a positive  mental state. Short: Continue to exercise regularly to support mental health and notify staff of any changes. Long: maintain mental health and well being through teaching of rehab or prescribed medications independently.    Interventions Encouraged to attend Pulmonary Rehabilitation for the exercise Encouraged to attend Pulmonary Rehabilitation for the exercise Encouraged to attend Pulmonary Rehabilitation for the exercise Encouraged to attend  Pulmonary Rehabilitation for the exercise    Continue Psychosocial Services  Follow up required by staff Follow up required by staff Follow up required by staff Follow up required by staff             Psychosocial Discharge (Final Psychosocial Re-Evaluation):  Psychosocial Re-Evaluation - 06/19/23 1409       Psychosocial Re-Evaluation   Current issues with None Identified    Comments Patient reports no issues with their current mental states, sleep, stress, depression or anxiety. Will follow up with patient in a few weeks for any changes.    Expected Outcomes Short: Continue to exercise regularly to support mental health and notify staff of any changes. Long: maintain mental health and well being through teaching of rehab or prescribed medications independently.    Interventions Encouraged to attend Pulmonary Rehabilitation for the exercise    Continue Psychosocial Services  Follow up required by staff             Education: Education Goals: Education classes will be provided on a weekly basis, covering required topics. Participant will state understanding/return demonstration of topics presented.  Learning Barriers/Preferences:   General Pulmonary Education Topics:  Infection Prevention: - Provides verbal and written material to individual with discussion of infection control including proper hand washing and proper equipment cleaning during exercise session. Flowsheet Row Pulmonary Rehab from 04/12/2023 in [facility] Cardiac and Pulmonary Rehab   Date 01/16/23  Educator [facility]  Instruction Review Code 1- Verbalizes Understanding       Falls Prevention: - Provides verbal and written material to individual with discussion of falls prevention and safety. Flowsheet Row Pulmonary Rehab from 04/12/2023 in [facility] Cardiac and Pulmonary Rehab  Date 01/16/23  Educator [facility]  Instruction Review Code 1- Verbalizes Understanding       Chronic Lung Disease Review: - Group verbal instruction with posters, models, PowerPoint presentations and videos,  to review new updates, new respiratory medications, new advancements in procedures and treatments. Providing information on websites and "800" numbers for continued self-education. Includes information about supplement oxygen, available portable oxygen systems, continuous and intermittent flow rates, oxygen safety, concentrators, and Medicare reimbursement for oxygen. Explanation of Pulmonary Drugs, including class, frequency, complications, importance of spacers, rinsing mouth after steroid MDI's, and proper cleaning methods for nebulizers. Review of basic lung anatomy and physiology related to function, structure, and complications of lung disease. Review of risk factors. Discussion about methods for diagnosing sleep apnea and types of masks and machines for OSA. Includes a review of the use of types of environmental controls: home humidity, furnaces, filters, dust mite/pet prevention, HEPA vacuums. Discussion about weather changes, air quality and the benefits of nasal washing. Instruction on Warning signs, infection symptoms, calling MD promptly, preventive modes, and value of vaccinations. Review of effective airway clearance, coughing and/or vibration techniques. Emphasizing that all should Create an Action Plan. Written material given at graduation. Flowsheet Row Pulmonary Rehab from 04/12/2023 in [facility] Cardiac and Pulmonary Rehab  Education need identified 01/26/23       AED/CPR: - Group verbal and  written instruction with the use of models to demonstrate the basic use of the AED with the basic ABC's of resuscitation.    Anatomy and Cardiac Procedures: - Group verbal and visual presentation and models provide information about basic cardiac anatomy and function. Reviews the testing methods done to diagnose heart disease and the outcomes of the test results. Describes the treatment choices: Medical Management, Angioplasty, or Coronary Bypass Surgery for treating various  heart conditions including Myocardial Infarction, Angina, Valve Disease, and Cardiac Arrhythmias.  Written material given at graduation.   Medication Safety: - Group verbal and visual instruction to review commonly prescribed medications for heart and lung disease. Reviews the medication, class of the drug, and side effects. Includes the steps to properly store meds and maintain the prescription regimen.  Written material given at graduation.   Other: -Provides group and verbal instruction on various topics (see comments)   Knowledge Questionnaire Score:    Core Components/Risk Factors/Patient Goals at Admission:   Education:Diabetes - Individual verbal and written instruction to review signs/symptoms of diabetes, desired ranges of glucose level fasting, after meals and with exercise. Acknowledge that pre and post exercise glucose checks will be done for 3 sessions at entry of program.   Know Your Numbers and Heart Failure: - Group verbal and visual instruction to discuss disease risk factors for cardiac and pulmonary disease and treatment options.  Reviews associated critical values for Overweight/Obesity, Hypertension, Cholesterol, and Diabetes.  Discusses basics of heart failure: signs/symptoms and treatments.  Introduces Heart Failure Zone chart for action plan for heart failure.  Written material given at graduation.   Core Components/Risk Factors/Patient Goals Review:   Goals and Risk Factor Review     Row  Name 04/10/23 1405 05/03/23 1433 05/29/23 1409 06/19/23 1410       Core Components/Risk Factors/Patient Goals Review   Personal Goals Review Improve shortness of breath with ADL's Improve shortness of breath with ADL's;Weight Management/Obesity;Increase knowledge of respiratory medications and ability to use respiratory devices properly. Weight Management/Obesity Weight Management/Obesity    Review Spoke to patient about their shortness of breath and what they can do to improve. Patient has been informed of breathing techniques when starting the program. Patient is informed to tell staff if they have had any med changes and that certain meds they are taking or not taking can be causing shortness of breath. Rayneisha is doing well with her weight and continues to work on weight loss.  Her breathing is getting better and she is no longer needing oxygen for exercise.  She is still using her nebulizer and doing well with it. Suhail would like to continue to lose weight. She would like to lose weight before her surgery in September.Rodney Booze is going to work on losing a few pounds in the next couple weeks. Harjot states she has no questions at this time about her medications or her health. She wants to continue working on weight loss.    Expected Outcomes Short: Attend [facility] regularly to improve shortness of breath with ADL's. Long: maintain independence with ADL's Short: Continue to exercise to help with weight loss long: Continue to monitor risk factors Short: lose some weight. Long: Reach weight goal. Short: lose some weight. Long: Reach weight goal.             Core Components/Risk Factors/Patient Goals at Discharge (Final Review):   Goals and Risk Factor Review - 06/19/23 1410       Core Components/Risk Factors/Patient Goals Review   Personal Goals Review Weight Management/Obesity    Review Zamaya states she has no questions at this time about her medications or her health. She wants to continue  working on weight loss.    Expected Outcomes Short: lose some weight. Long: Reach weight goal.             ITP Comments:  ITP Comments     Row Name 02/15/23 0932 03/13/23 1359 03/15/23 1401  04/04/23 1159 04/12/23 0936   ITP Comments 30 Day review completed. Medical Director ITP review done, changes made as directed, and signed approval by Medical Director.    new to program First full day of exercise!  Patient was oriented to gym and equipment including functions, settings, policies, and procedures.  Patient's individual exercise prescription and treatment plan were reviewed.  All starting workloads were established based on the results of the 6 minute walk test done at initial orientation visit.  The plan for exercise progression was also introduced and progression will be customized based on patient's performance and goals. 30 day review completed. ITP sent to Dr. Jinny Sanders, Medical Director of  Pulmonary Rehab. Continue with ITP unless changes are made by physician. First day of exercise was just this week, pt delayed her start. Ayverie called to let us know that she was still have significant back pain.  She went to ED for it and was given a shot and pain killers.  She has been resting and icing.  She wants to give it until Monday 5/13 to try again. 30 Day review completed. Medical Director ITP review done, changes made as directed, and signed approval by Medical Director.    Row Name 05/10/23 1129 06/06/23 1502 07/05/23 1201 08/02/23 1114     ITP Comments 30 Day review completed. Medical Director ITP review done, changes made as directed, and signed approval by Medical Director. 30 Day review completed. Medical Director ITP review done, changes made as directed, and signed approval by Medical Director. 30 Day review completed. Medical Director ITP review done, changes made as directed, and signed approval by Medical Director. 30 Day review completed. Medical Director ITP review done, changes  made as directed, and signed approval by Medical Director.             Comments:

## 2023-08-08 ENCOUNTER — Ambulatory Visit: Payer: 59 | Admitting: Obstetrics and Gynecology

## 2023-08-08 DIAGNOSIS — N920 Excessive and frequent menstruation with regular cycle: Secondary | ICD-10-CM

## 2023-08-08 DIAGNOSIS — D219 Benign neoplasm of connective and other soft tissue, unspecified: Secondary | ICD-10-CM

## 2023-08-17 ENCOUNTER — Encounter: Payer: Self-pay | Admitting: Pulmonary Disease

## 2023-08-17 ENCOUNTER — Ambulatory Visit (INDEPENDENT_AMBULATORY_CARE_PROVIDER_SITE_OTHER): Payer: Medicare Other | Admitting: Pulmonary Disease

## 2023-08-17 VITALS — BP 120/82 | HR 75 | Temp 98.1°F | Ht 69.0 in | Wt 264.0 lb

## 2023-08-17 DIAGNOSIS — E669 Obesity, unspecified: Secondary | ICD-10-CM

## 2023-08-17 DIAGNOSIS — J8410 Pulmonary fibrosis, unspecified: Secondary | ICD-10-CM

## 2023-08-17 DIAGNOSIS — J386 Stenosis of larynx: Secondary | ICD-10-CM | POA: Diagnosis not present

## 2023-08-17 DIAGNOSIS — G4736 Sleep related hypoventilation in conditions classified elsewhere: Secondary | ICD-10-CM

## 2023-08-17 DIAGNOSIS — E039 Hypothyroidism, unspecified: Secondary | ICD-10-CM

## 2023-08-17 DIAGNOSIS — J4540 Moderate persistent asthma, uncomplicated: Secondary | ICD-10-CM | POA: Diagnosis not present

## 2023-08-17 NOTE — Progress Notes (Signed)
Subjective:    Patient ID: Kathy Vega, female    DOB: 1976-08-29, 47 y.o.   MRN: [id number]  Patient Care Team: Center, [facility] as PCP - General (General Practice) Salena Saner, MD as Consulting Physician (Pulmonary Disease) Earna Coder, MD as Consulting Physician (Internal Medicine)  Chief Complaint  Patient presents with   Follow-up    Breathing is the same. SOB all the time. No wheezing or cough.     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  Patient was last seen here on 4 April y 2024 by me.  This is a scheduled visit.  As noted she has tracheal stenosis at has been followed by Dr. Vergie Living at [facility].  She currently has a # 4 Shiley.  She is tolerating Passy-Muir valve well.  After an admission in November 2023 she ended up with O2 at 6 L/min.  However, I did not see a true indication for this.  She has been weaned off of oxygen during the day but uses it at nighttime with sleep.  She is compliant with this.  She is undergoing pulmonary rehab and has not had significant desaturations during the sessions.  He has 2 more sessions to go.  She feels that she is getting great benefit from pulmonary rehab.   She has not any chest pain.  No cough or sputum production.  Her trach site looks very clean she does self care.  She is vocalizing well with the # 4 Shiley and Passy-Muir valve.   She last saw Dr. Vergie Living at [facility] on the 7 June, to correct her issues and be able to decannulate her she will need open surgical tracheal reconstruction for this month however, her daughter passed away and she postponed the procedure.  She continues to have trach changes through Dr. Atha Starks office.   At the prior visit we added Perforomist to her  medication regimen for her asthma and asked her to continue Pulmicort.  She has been doing this twice a day and notes benefit from the therapy.  She rarely has to use albuterol rescue.   No other concerns voiced today.  She feels well and looks well.   Review of Systems A 10 point review of systems was performed and it is as noted above otherwise negative.   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) 09/18/2020   Acute respiratory failure (HCC)    Pneumonia due to COVID-19 virus 08/13/2020   Acute renal failure (ARF) (HCC) 08/13/2020   Elevated liver enzymes 08/13/2020   CAP (community acquired pneumonia) 08/13/2020   Acute encephalopathy 08/13/2020   Hypokalemia 08/13/2020    Social History   Tobacco Use   Smoking status: Former    Current packs/day: 0.00    Average packs/day: 0.5 packs/day for 5.0 years (2.5 ttl pk-yrs)    Types: Cigarettes    Start  date: 86    Quit date: 2000    Years since quitting: 24.7   Smokeless tobacco: Never  Substance Use Topics   Alcohol use: No    No Known Allergies  Current Meds  Medication Sig   acetaminophen (TYLENOL) 500 MG tablet Take 500 mg by mouth every 6 (six) hours as needed for headache, fever, moderate pain or mild pain.   albuterol (PROVENTIL) (2.5 MG/3ML) 0.083% nebulizer solution Take 3 mLs (2.5 mg total) by nebulization every 6 (six) hours as needed for wheezing or shortness of breath.   ascorbic acid (VITAMIN C) 500 MG tablet Take 1 tablet (500 mg total) by mouth daily.   bisoprolol (ZEBETA) 5 MG tablet Take 1  tablet (5 mg total) by mouth daily.   formoterol (PERFOROMIST) 20 MCG/2ML nebulizer solution Take 2 mLs (20 mcg total) by nebulization 2 (two) times daily. Use before the budesonide, twice a day.   furosemide (LASIX) 20 MG tablet Take 1 tablet (20 mg total) by mouth daily as needed for edema (shortness of breath).   HYDROcodone-acetaminophen (NORCO/VICODIN) 5-325 MG tablet Take 1 tablet by mouth every 4 (four) hours as needed for moderate pain.   hydrOXYzine (ATARAX) 10 MG tablet Take 10 mg by mouth 3 (three) times daily.   iron polysaccharides (NIFEREX) 150 MG capsule Take 1 capsule (150 mg total) by mouth daily.   levothyroxine (SYNTHROID) 125 MCG tablet Take 250 mcg by mouth daily.   meloxicam (MOBIC) 15 MG tablet Take 1 tablet (15 mg total) by mouth daily.   methocarbamol (ROBAXIN) 500 MG tablet Take 1 tablet (500 mg total) by mouth 4 (four) times daily.   norethindrone (AYGESTIN) 5 MG tablet Take 2 tablets (10 mg total) by mouth daily.   nystatin cream (MYCOSTATIN) Apply topically 2 (two) times daily.   OXYGEN Inhale into the lungs at bedtime as needed. Up to 4 liters   pantoprazole (PROTONIX) 40 MG tablet Take 1 tablet (40 mg total) by mouth daily.   propranolol (INDERAL) 10 MG tablet Take 1 tablet (10 mg total) by mouth 3 (three) times daily as needed (palpitations).     There is no immunization history on file for this patient.      Objective:     BP 120/82 (BP Location: Right Arm, Cuff Size: Large)   Pulse 75   Temp 98.1 F (36.7 C)   Ht 5\' 9"  (1.753 m)   Wt 264 lb (119.7 kg)   SpO2 96%   BMI 38.99 kg/m   SpO2: 96 % O2 Device: None (Room air)  GENERAL: Obese woman, no acute distress, tracheostomy in place.  She has a Passy-Muir valve and able to speak without difficulty.  Not wearing oxygen. HEAD: Normocephalic, atraumatic.  EYES: Pupils equal, round, reactive to light.  No scleral icterus.  MOUTH: Oral mucosa moist.  No thrush. NECK: Supple. No lymphadenopathy.   Tracheostomy in place as above with Passy-Muir valve. No JVD.  Trachea midline. PULMONARY: Good air entry bilaterally.  To auscultation bilaterally.   CARDIOVASCULAR: S1 and S2. Regular rate and rhythm.  No rubs, murmurs or gallops heard. ABDOMEN: Obese otherwise unremarkable. MUSCULOSKELETAL: No joint deformity, no clubbing, no edema.  NEUROLOGIC: No overt focal deficit. SKIN: Intact,warm,dry. PSYCH: Mood and behavior normal.    Assessment & Plan:     ICD-10-CM   1. Moderate persistent asthma without complication  J45.40    Patient appears well compensated Continue Perforomist and budesonide Continue as needed albuterol    2. 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 Vergie Living at [facility]    3. 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 Hypothyroidism (acquired)  E03.9    Issue adds complexity to her management Follows with primary care On supplementation     Overall Kathy Vega is doing well.  Will need surgery for eventual decannulation of her tracheostomy.  Has been very well-controlled.  We will see her in follow-up in 4 months time she is to contact us prior to that time should any new difficulties arise.     Gailen Shelter, MD Advanced Bronchoscopy PCCM [facility]    *This note was dictated using voice recognition software/Dragon.  Despite best efforts to proofread, errors can occur which can change the meaning. Any transcriptional errors that result from this process are unintentional and may not be fully corrected at the time of dictation.

## 2023-08-17 NOTE — Patient Instructions (Addendum)
Your lungs sounded clear today.  Continue using your nose as you are doing.  I am glad that the rehab helped you.  Follow-up in 4 months time.  Please call sooner should any new problems arise.

## 2023-08-21 ENCOUNTER — Encounter: Payer: Medicare Other | Attending: Pulmonary Disease | Admitting: *Deleted

## 2023-08-21 DIAGNOSIS — J4540 Moderate persistent asthma, uncomplicated: Secondary | ICD-10-CM | POA: Insufficient documentation

## 2023-08-21 NOTE — Progress Notes (Signed)
Daily Session Note  Patient Details  Name: ALVADA OLANO MRN: [id number] Date of Birth: [birth year] Referring Provider:   Flowsheet Row Pulmonary Rehab from 01/26/2023 in [facility]  Referring Provider Sarina Ser MD       Encounter Date: 08/21/2023  Check In:  Session Check In - 08/21/23 1412       Check-In   Supervising physician immediately available to respond to emergencies See telemetry face sheet for immediately available ER MD    Location [facility]-Cardiac & Pulmonary Rehab    Staff Present Maxon Suzzette Righter, , Exercise Physiologist;Dayanira Giovannetti Jewel Baize, RN BSN;Joseph Reino Kent, RCP,RRT,BSRT    Virtual Visit No    Medication changes reported     No    Fall or balance concerns reported    No    Warm-up and Cool-down Performed on first and last piece of equipment    Resistance Training Performed Yes    VAD Patient? No    PAD/SET Patient? No      Pain Assessment   Currently in Pain? No/denies                Social History   Tobacco Use  Smoking Status Former   Current packs/day: 0.00   Average packs/day: 0.5 packs/day for 5.0 years (2.5 ttl pk-yrs)   Types: Cigarettes   Start date: 43   Quit date: 2000   Years since quitting: 24.7  Smokeless Tobacco Never    Goals Met:  Independence with exercise equipment Exercise tolerated well No report of concerns or symptoms today Strength training completed today  Goals Unmet:  Not Applicable  Comments: Pt able to follow exercise prescription today without complaint.  Will continue to monitor for progression.    Dr. Bethann Punches is Medical Director for [facility] Cardiac Rehabilitation.  Dr. Vida Rigger is Medical Director for [facility] Pulmonary Rehabilitation.

## 2023-08-23 ENCOUNTER — Encounter: Payer: Self-pay | Admitting: Internal Medicine

## 2023-08-24 ENCOUNTER — Encounter: Payer: Self-pay | Admitting: Internal Medicine

## 2023-08-30 ENCOUNTER — Encounter: Payer: Self-pay | Admitting: *Deleted

## 2023-08-30 DIAGNOSIS — J4540 Moderate persistent asthma, uncomplicated: Secondary | ICD-10-CM

## 2023-08-30 NOTE — Progress Notes (Signed)
Pulmonary Individual Treatment Plan  Patient Details  Name: Kathy Vega MRN: [id number] Date of Birth: 1976-02-12 Referring Provider:   Flowsheet Row Pulmonary Rehab from 01/26/2023 in [facility] Cardiac and Pulmonary Rehab  Referring Provider Sarina Ser MD       Initial Encounter Date:  Flowsheet Row Pulmonary Rehab from 01/26/2023 in [facility] Cardiac and Pulmonary Rehab  Date 01/26/23       Visit Diagnosis: Moderate persistent asthma in adult without complication  Patient's Home Medications on Admission:  Current Outpatient Medications:    acetaminophen (TYLENOL) 500 MG tablet, Take 500 mg by mouth every 6 (six) hours as needed for headache, fever, moderate pain or mild pain., Disp: , Rfl:    albuterol (PROVENTIL) (2.5 MG/3ML) 0.083% nebulizer solution, Take 3 mLs (2.5 mg total) by nebulization every 6 (six) hours as needed for wheezing or shortness of breath., Disp: 360 mL, Rfl: 11   ascorbic acid (VITAMIN C) 500 MG tablet, Take 1 tablet (500 mg total) by mouth daily., Disp: 30 tablet, Rfl: 0   bisoprolol (ZEBETA) 5 MG tablet, Take 1 tablet (5 mg total) by mouth daily., Disp: 30 tablet, Rfl: 6   budesonide (PULMICORT) 0.5 MG/2ML nebulizer solution, Take 2 mLs (0.5 mg total) by nebulization 2 (two) times daily., Disp: 120 mL, Rfl: 5   formoterol (PERFOROMIST) 20 MCG/2ML nebulizer solution, Take 2 mLs (20 mcg total) by nebulization 2 (two) times daily. Use before the budesonide, twice a day., Disp: 120 mL, Rfl: 11   furosemide (LASIX) 20 MG tablet, Take 1 tablet (20 mg total) by mouth daily as needed for edema (shortness of breath)., Disp: 90 tablet, Rfl: 3   HYDROcodone-acetaminophen (NORCO/VICODIN) 5-325 MG tablet, Take 1 tablet by mouth every 4 (four) hours as needed for moderate pain., Disp: 12 tablet, Rfl: 0   hydrOXYzine (ATARAX) 10 MG tablet, Take 10 mg by mouth 3 (three) times daily., Disp: , Rfl:    iron polysaccharides (NIFEREX) 150 MG capsule, Take 1 capsule (150 mg total) by  mouth daily., Disp: 30 capsule, Rfl: 1   levothyroxine (SYNTHROID) 125 MCG tablet, Take 250 mcg by mouth daily., Disp: , Rfl:    meloxicam (MOBIC) 15 MG tablet, Take 1 tablet (15 mg total) by mouth daily., Disp: 30 tablet, Rfl: 0   methocarbamol (ROBAXIN) 500 MG tablet, Take 1 tablet (500 mg total) by mouth 4 (four) times daily., Disp: 30 tablet, Rfl: 0   norethindrone (AYGESTIN) 5 MG tablet, Take 2 tablets (10 mg total) by mouth daily., Disp: 30 tablet, Rfl: 1   nystatin cream (MYCOSTATIN), Apply topically 2 (two) times daily., Disp: 30 g, Rfl: 0   OXYGEN, Inhale into the lungs at bedtime as needed. Up to 4 liters, Disp: , Rfl:    pantoprazole (PROTONIX) 40 MG tablet, Take 1 tablet (40 mg total) by mouth daily., Disp: 30 tablet, Rfl: 2   propranolol (INDERAL) 10 MG tablet, Take 1 tablet (10 mg total) by mouth 3 (three) times daily as needed (palpitations)., Disp: 90 tablet, Rfl: 6  Past Medical History: Past Medical History:  Diagnosis Date   Acute encephalopathy 08/13/2020   Acute renal failure (ARF) (HCC) 08/13/2020   Anemia    Asthma    Cervical cancer (HCC)    Hypothyroidism    Iron deficiency anemia    Obesity (BMI 30-39.9)    Pneumonia due to COVID-19 virus 08/13/2020   requiring trach and prolonged hospitalization   Subglottic stenosis    Tracheal stenosis    Tracheostomy  tube present (HCC)     Tobacco Use: Social History   Tobacco Use  Smoking Status Former   Current packs/day: 0.00   Average packs/day: 0.5 packs/day for 5.0 years (2.5 ttl pk-yrs)   Types: Cigarettes   Start date: 56   Quit date: 2000   Years since quitting: 24.7  Smokeless Tobacco Never    Labs: Review Flowsheet  More data exists      Latest Ref Rng & Units 08/16/2020 08/17/2020 08/18/2020 08/20/2020 08/31/2020  Labs for ITP Cardiac and Pulmonary Rehab  Trlycerides <150 mg/dL - 0,932  - - -  PH, Arterial 7.350 - 7.450 7.40  - 7.41  7.38  7.42  7.47   PCO2 arterial 32.0 - 48.0 mmHg 44  - 49  56   48  53   Bicarbonate 20.0 - 28.0 mmol/L 27.3  - 31.1  33.1  31.1  38.6   O2 Saturation % 98.8  - 92.3  94.6  93.1  92.5     Details       Multiple values from one day are sorted in reverse-chronological order          Pulmonary Assessment Scores:   UCSD: Self-administered rating of dyspnea associated with activities of daily living (ADLs) 6-point scale (0 = "not at all" to 5 = "maximal or unable to do because of breathlessness")  Scoring Scores range from 0 to 120.  Minimally important difference is 5 units  CAT: CAT can identify the health impairment of COPD patients and is better correlated with disease progression.  CAT has a scoring range of zero to 40. The CAT score is classified into four groups of low (less than 10), medium (10 - 20), high (21-30) and very high (31-40) based on the impact level of disease on health status. A CAT score over 10 suggests significant symptoms.  A worsening CAT score could be explained by an exacerbation, poor medication adherence, poor inhaler technique, or progression of COPD or comorbid conditions.  CAT MCID is 2 points  mMRC: mMRC (Modified Medical Research Council) Dyspnea Scale is used to assess the degree of baseline functional disability in patients of respiratory disease due to dyspnea. No minimal important difference is established. A decrease in score of 1 point or greater is considered a positive change.   Pulmonary Function Assessment:   Exercise Target Goals: Exercise Program Goal: Individual exercise prescription set using results from initial 6 min walk test and THRR while considering  patient's activity barriers and safety.   Exercise Prescription Goal: Initial exercise prescription builds to 30-45 minutes a day of aerobic activity, 2-3 days per week.  Home exercise guidelines will be given to patient during program as part of exercise prescription that the participant will acknowledge.  Education: Aerobic Exercise: -  Group verbal and visual presentation on the components of exercise prescription. Introduces F.I.T.T principle from ACSM for exercise prescriptions.  Reviews F.I.T.T. principles of aerobic exercise including progression. Written material given at graduation.   Education: Resistance Exercise: - Group verbal and visual presentation on the components of exercise prescription. Introduces F.I.T.T principle from ACSM for exercise prescriptions  Reviews F.I.T.T. principles of resistance exercise including progression. Written material given at graduation.    Education: Exercise & Equipment Safety: - Individual verbal instruction and demonstration of equipment use and safety with use of the equipment. Flowsheet Row Pulmonary Rehab from 04/12/2023 in [facility] Cardiac and Pulmonary Rehab  Date 01/16/23  Educator [facility]  Instruction Review Code 1- Verbalizes Understanding  Education: Exercise Physiology & General Exercise Guidelines: - Group verbal and written instruction with models to review the exercise physiology of the cardiovascular system and associated critical values. Provides general exercise guidelines with specific guidelines to those with heart or lung disease.    Education: Flexibility, Balance, Mind/Body Relaxation: - Group verbal and visual presentation with interactive activity on the components of exercise prescription. Introduces F.I.T.T principle from ACSM for exercise prescriptions. Reviews F.I.T.T. principles of flexibility and balance exercise training including progression. Also discusses the mind body connection.  Reviews various relaxation techniques to help reduce and manage stress (i.e. Deep breathing, progressive muscle relaxation, and visualization). Balance handout provided to take home. Written material given at graduation.   Activity Barriers & Risk Stratification:   6 Minute Walk:  Oxygen Initial Assessment:   Oxygen Re-Evaluation:  Oxygen Re-Evaluation     Row  Name 03/13/23 1400 04/10/23 1403 05/03/23 1435 05/29/23 1412       Program Oxygen Prescription   Program Oxygen Prescription -- Continuous None None    Liters per minute -- 4 -- --    Comments -- patient wanted to try room air for exercise today. now staying on room air and saturation are staying above 90% --      Home Oxygen   Home Oxygen Device -- E-Tanks;Home Concentrator None;Home Concentrator  not needing currently None;Home Concentrator    Sleep Oxygen Prescription -- Continuous None None    Liters per minute -- 5 -- --    Home Exercise Oxygen Prescription -- -- None None    Liters per minute -- 4 -- --    Home Resting Oxygen Prescription -- Continuous None None    Liters per minute -- 4 -- --    Compliance with Home Oxygen Use -- Yes Yes --      Goals/Expected Outcomes   Short Term Goals To learn and exhibit compliance with exercise, home and travel O2 prescription;To learn and understand importance of monitoring SPO2 with pulse oximeter and demonstrate accurate use of the pulse oximeter.;To learn and demonstrate proper pursed lip breathing techniques or other breathing techniques. ;To learn and understand importance of maintaining oxygen saturations>88%;To learn and demonstrate proper use of respiratory medications To learn and demonstrate proper pursed lip breathing techniques or other breathing techniques.  To learn and exhibit compliance with exercise, home and travel O2 prescription;To learn and understand importance of monitoring SPO2 with pulse oximeter and demonstrate accurate use of the pulse oximeter.;To learn and understand importance of maintaining oxygen saturations>88%;To learn and demonstrate proper pursed lip breathing techniques or other breathing techniques.  To learn and understand importance of monitoring SPO2 with pulse oximeter and demonstrate accurate use of the pulse oximeter.;To learn and understand importance of maintaining oxygen saturations>88%    Long  Term  Goals Exhibits compliance with exercise, home  and travel O2 prescription;Maintenance of O2 saturations>88%;Compliance with respiratory medication;Demonstrates proper use of MDI's;Exhibits proper breathing techniques, such as pursed lip breathing or other method taught during program session;Verbalizes importance of monitoring SPO2 with pulse oximeter and return demonstration Exhibits proper breathing techniques, such as pursed lip breathing or other method taught during program session Exhibits compliance with exercise, home  and travel O2 prescription;Verbalizes importance of monitoring SPO2 with pulse oximeter and return demonstration;Maintenance of O2 saturations>88%;Compliance with respiratory medication;Exhibits proper breathing techniques, such as pursed lip breathing or other method taught during program session Verbalizes importance of monitoring SPO2 with pulse oximeter and return demonstration;Maintenance of O2 saturations>88%    Comments  Reviewed PLB technique with pt.  Talked about how it works and it's importance in maintaining their exercise saturations. Diaphragmatic and PLB breathing explained and performed with patient. Patient has a better understanding of how to do these exercises to help with breathing performance and relaxation. Patient performed breathing techniques adequately and to practice further at home. Patient would like to titrate her oxygen today and see how her oxygen saturations are while exercising. Tress is doing well in rehab.  She is no longer requiring oxygen therapy to maintain her saturations.  She continues to check them and uses her nebulizer routinely at home.  She is good about using her PLB as well.  She wants to ask about getting her trach out and plans to ask her doctor about it at end of month.  She is worried about closing off if it comes out, but really doesn't want it the rest of her life. She/has a pulse oximeter to check her oxygen saturation at home. Informed  and explained why it is important to have one. Reviewed that oxygen saturations should be 88 percent and above. Patient verbalizes understanding.    Goals/Expected Outcomes Short: Become more profiecient at using PLB.   Long: Become independent at using PLB. Short: practice PLB and diaphragmatic breathing at home. Long: Use PLB, titrate oxygen and diaphragmatic breathing independently post [employer]. Short: Continue to monitor saturations Long: Talk to doctor about trach removal. Short: monitor oxygen at home with exertion. Long: maintain oxygen saturations above 88 percent independently.             Oxygen Discharge (Final Oxygen Re-Evaluation):  Oxygen Re-Evaluation - 05/29/23 1412       Program Oxygen Prescription   Program Oxygen Prescription None      Home Oxygen   Home Oxygen Device None;Home Concentrator    Sleep Oxygen Prescription None    Home Exercise Oxygen Prescription None    Home Resting Oxygen Prescription None      Goals/Expected Outcomes   Short Term Goals To learn and understand importance of monitoring SPO2 with pulse oximeter and demonstrate accurate use of the pulse oximeter.;To learn and understand importance of maintaining oxygen saturations>88%    Long  Term Goals Verbalizes importance of monitoring SPO2 with pulse oximeter and return demonstration;Maintenance of O2 saturations>88%    Comments She/has a pulse oximeter to check her oxygen saturation at home. Informed and explained why it is important to have one. Reviewed that oxygen saturations should be 88 percent and above. Patient verbalizes understanding.    Goals/Expected Outcomes Short: monitor oxygen at home with exertion. Long: maintain oxygen saturations above 88 percent independently.             Initial Exercise Prescription:   Perform Capillary Blood Glucose checks as needed.  Exercise Prescription Changes:   Exercise Prescription Changes     Row Name 03/16/23 1400 03/29/23 1300 04/12/23  1300 04/27/23 1500 05/10/23 0900     Response to Exercise   Blood Pressure (Admit) 132/80 136/82 148/80 110/72 134/62   Blood Pressure (Exercise) 158/70 138/70 128/70 144/76 148/80   Blood Pressure (Exit) 130/80 140/70 112/72 104/62 122/62   Heart Rate (Admit) 114 bpm 102 bpm 90 bpm 95 bpm 95 bpm   Heart Rate (Exercise) 110 bpm 134 bpm 128 bpm 128 bpm 124 bpm   Heart Rate (Exit) 106 bpm 109 bpm 105 bpm 105 bpm 102 bpm   Oxygen Saturation (Admit) 92 % 98 % 92 % 94 % 93 %  Oxygen Saturation (Exercise) 96 % 95 % 88 % 89 % 92 %   Oxygen Saturation (Exit) 97 % 97 % 94 % 96 % 94 %   Rating of Perceived Exertion (Exercise) 11 13 13 16 13    Perceived Dyspnea (Exercise) 1 3 1 3 2    Symptoms SOB SOB SOB SOB SOB   Comments First full day of exercise -- -- -- --   Duration Progress to 30 minutes of  aerobic without signs/symptoms of physical distress Progress to 30 minutes of  aerobic without signs/symptoms of physical distress Progress to 30 minutes of  aerobic without signs/symptoms of physical distress Continue with 30 min of aerobic exercise without signs/symptoms of physical distress. Continue with 30 min of aerobic exercise without signs/symptoms of physical distress.   Intensity THRR unchanged THRR unchanged THRR unchanged THRR unchanged THRR unchanged     Progression   Progression Continue to progress workloads to maintain intensity without signs/symptoms of physical distress. Continue to progress workloads to maintain intensity without signs/symptoms of physical distress. Continue to progress workloads to maintain intensity without signs/symptoms of physical distress. Continue to progress workloads to maintain intensity without signs/symptoms of physical distress. Continue to progress workloads to maintain intensity without signs/symptoms of physical distress.   Average METs 1.8 1.53 1.93 2.03 3.26     Resistance Training   Training Prescription Yes Yes Yes Yes Yes   Weight 3 lb  no overhead  exercises; relayed to patient 3 lb 3 lb 3 lb 3 lb   Reps 10-15 10-15 10-15 10-15 10-15     Interval Training   Interval Training -- -- No No No     Oxygen   Oxygen Continuous Continuous -- -- --   Liters 3 3  Trach collar RA RA RA     NuStep   Level -- -- -- 1 5   Minutes -- -- -- 15 30   METs -- -- -- 2.3 4.6     Arm Ergometer   Level -- -- -- 1 --   Minutes -- -- -- 15 --   METs -- -- -- 2.1 --     REL-XR   Level -- -- -- 4 5   Minutes -- -- -- 15 15   METs -- -- -- 1 3.6     T5 Nustep   Level 1 -- 2 -- --   Minutes 30 -- 15 -- --   METs 1.8 -- 2.2 -- --     Biostep-RELP   Level -- 1 -- 1 --   Minutes -- 15 -- 15 --   METs -- 2 -- 3 --     Track   Laps -- 10 12 18 8    Minutes -- 15 15 15 15    METs -- 1.54 1.65 1.98 1.44     Oxygen   Maintain Oxygen Saturation 88% or higher 88% or higher 88% or higher 88% or higher 88% or higher    Row Name 05/25/23 1400 06/10/23 1100 07/04/23 1500 07/17/23 0900 07/19/23 1400     Response to Exercise   Blood Pressure (Admit) 132/80 128/80 146/90 122/60 --   Blood Pressure (Exit) 136/78 122/78 146/98 120/98 --   Heart Rate (Admit) 90 bpm 92 bpm 93 bpm 80 bpm --   Heart Rate (Exercise) 118 bpm 115 bpm 122 bpm 118 bpm --   Heart Rate (Exit) 102 bpm 106 bpm 124 bpm 105 bpm --   Oxygen Saturation (Admit) 96 % 97 %  99 % 96 % --   Oxygen Saturation (Exercise) 92 % 95 % 91 % 93 % --   Oxygen Saturation (Exit) 95 % 95 % 94 % 93 % --   Rating of Perceived Exertion (Exercise) 13 13 13 13  --   Perceived Dyspnea (Exercise) 2 2 2 1  --   Symptoms SOB SOB SOB SOB --   Duration Continue with 30 min of aerobic exercise without signs/symptoms of physical distress. Continue with 30 min of aerobic exercise without signs/symptoms of physical distress. Continue with 30 min of aerobic exercise without signs/symptoms of physical distress. Continue with 30 min of aerobic exercise without signs/symptoms of physical distress. --   Intensity THRR  unchanged THRR unchanged THRR unchanged THRR unchanged --     Progression   Progression Continue to progress workloads to maintain intensity without signs/symptoms of physical distress. Continue to progress workloads to maintain intensity without signs/symptoms of physical distress. Continue to progress workloads to maintain intensity without signs/symptoms of physical distress. Continue to progress workloads to maintain intensity without signs/symptoms of physical distress. --   Average METs 2.24 2.35 2.09 3.55 --     Resistance Training   Training Prescription Yes Yes Yes Yes --   Weight 3 lb 3 lb 4 lb 4 lb --   Reps 10-15 10-15 10-15 10-15 --     Interval Training   Interval Training No No No No --     Oxygen   Liters RA RA RA RA --     NuStep   Level -- 3 -- -- --   Minutes -- 15 -- -- --   METs -- 2.6 -- -- --     Arm Ergometer   Level -- 1 1.5 1.5 --   Minutes -- 15 15 15  --   METs -- 2.6 1 2.4 --     REL-XR   Level 5 -- -- 6 --   Minutes 15 -- -- 15 --   METs 3 -- -- 5.8 --     T5 Nustep   Level 2 -- -- -- --   Minutes 30 -- -- -- --   METs 2.2 -- -- -- --     Biostep-RELP   Level -- -- 3 3 --   Minutes -- -- 15 15 --   METs -- -- 3 3 --     Track   Laps 18 20 28  42 --   Minutes 15 15 15 15  --   METs 1.98 2.09 2.52 3.28 --     Home Exercise Plan   Plans to continue exercise at -- -- -- -- Home (comment)  Walking and stretching at home in neighborhood, potentially joining [employer]   Frequency -- -- -- -- Add 2 additional days to program exercise sessions.   Initial Home Exercises Provided -- -- -- -- 07/19/23     Oxygen   Maintain Oxygen Saturation 88% or higher 88% or higher 88% or higher 88% or higher 88% or higher    Row Name 08/01/23 1400             Response to Exercise   Blood Pressure (Admit) 106/60       Blood Pressure (Exit) 116/60       Heart Rate (Admit) 111 bpm       Heart Rate (Exercise) 130 bpm       Heart Rate (Exit) 105 bpm        Oxygen Saturation (Admit) 95 %  Oxygen Saturation (Exercise) 94 %       Oxygen Saturation (Exit) 98 %       Rating of Perceived Exertion (Exercise) 13       Perceived Dyspnea (Exercise) 2       Symptoms SOB       Duration Continue with 30 min of aerobic exercise without signs/symptoms of physical distress.       Intensity THRR unchanged         Progression   Progression Continue to progress workloads to maintain intensity without signs/symptoms of physical distress.       Average METs 2.17         Resistance Training   Training Prescription Yes       Weight 4 lb       Reps 10-15         Interval Training   Interval Training No         NuStep   Level 5       Minutes 15       METs 3.8         Arm Ergometer   Level 4       Minutes 15       METs 1         REL-XR   Level 5       Minutes 15         Track   Laps 20       Minutes 15       METs 2.09         Home Exercise Plan   Plans to continue exercise at Home (comment)  Walking and stretching at home in neighborhood, potentially joining [employer]       Frequency Add 2 additional days to program exercise sessions.       Initial Home Exercises Provided 07/19/23         Oxygen   Maintain Oxygen Saturation 88% or higher                Exercise Comments:   Exercise Comments     Row Name 03/13/23 1400           Exercise Comments First full day of exercise!  Patient was oriented to gym and equipment including functions, settings, policies, and procedures.  Patient's individual exercise prescription and treatment plan were reviewed.  All starting workloads were established based on the results of the 6 minute walk test done at initial orientation visit.  The plan for exercise progression was also introduced and progression will be customized based on patient's performance and goals.                Exercise Goals and Review:   Exercise Goals Re-Evaluation :  Exercise Goals Re-Evaluation     Row  Name 03/13/23 1400 03/16/23 1451 03/29/23 1341 04/12/23 1329 04/27/23 1541     Exercise Goal Re-Evaluation   Exercise Goals Review Increase Physical Activity;Able to understand and use rate of perceived exertion (RPE) scale;Knowledge and understanding of Target Heart Rate Range (THRR);Understanding of Exercise Prescription;Increase Strength and Stamina;Able to check pulse independently;Able to understand and use Dyspnea scale Increase Physical Activity;Increase Strength and Stamina;Understanding of Exercise Prescription Increase Physical Activity;Increase Strength and Stamina;Understanding of Exercise Prescription Increase Physical Activity;Increase Strength and Stamina;Understanding of Exercise Prescription Increase Physical Activity;Increase Strength and Stamina;Understanding of Exercise Prescription   Comments Reviewed RPE scale, THR and program prescription with pt today.  Pt voiced understanding and was given a  copy of goals to take home. Shekema is off to a good start in rehab. During her first session she was able to work at level 1 on the T5 nustep. She also did well with 3 lb hand weights for resistance training. We will continue to monitor her progress in the program. Rodney Booze completed 2 more sessions since last review, she was not able to attend the other sessions as she had to call out. She tried out the Biostep at level 1 and was able to walk 10 laps on the track! Patient has a trach to work with during exercise and has back up supplies with her here. Her oxygen saturations are staying nicely above 88%. We will continue to monitor, Mishka has only completed one session since the last review and has been inconsistent with her attendance. She did perform her exercise without oxygen during his last session and did well on room air. She was able to keep her O2 saturation above 88% as well. She also improved to level 2 on the T5 nustep and walked up to 12 laps on the track. We will continue to monitor her  progress in the program. Loranda is doing well in rehab. She recently increased her overall average MET level to 2.03 METs. She also has continued to do well with exercise without oxygen, and her O2 saturation has only dropped below 90% once since the last review. She walked up to 18 laps as well, and improved to level 4 on the XR. We will continue to monitor her progress in the program.   Expected Outcomes Short: Use RPE daily to regulate intensity.  Long: Follow program prescription in THR. Short: Continue to attend rehab consistently. Long: Continue to follow current exercise prescription. Short: Continue working in Engineer, manufacturing systems on track to increase laps Long: Continue to increae overall MET level and stamina Short: Attend rehab more consistently, progressively increase workloads. Long: Continue to improve strength and stamina. Short: Continue to progressively increase workloads. Long: Continue to increase overall MET level and stamina.    Row Name 05/03/23 1427 05/10/23 0931 05/25/23 1417 06/10/23 1122 07/04/23 1517     Exercise Goal Re-Evaluation   Exercise Goals Review Increase Physical Activity;Increase Strength and Stamina;Understanding of Exercise Prescription Increase Physical Activity;Increase Strength and Stamina;Understanding of Exercise Prescription Increase Physical Activity;Increase Strength and Stamina;Understanding of Exercise Prescription Increase Physical Activity;Increase Strength and Stamina;Understanding of Exercise Prescription Increase Physical Activity;Increase Strength and Stamina;Understanding of Exercise Prescription   Comments Susi is doing well in rehab. She is exercising some on her off days from rehab by walking.  She is doing well with it; her breathing is her biggest limitation, but it is doing well.  She has noticed that her stamina is getting better with being in rehab. Ayo continues to do well in rehab. She was able to improve to level 5 on both the T4 nustep and the  XR. She also has continued to do well walking on the track without oxygen, as her O2 saturations have not dropped below 92%. She also increased her overall average MET level to 3.26 METs. We will continue to monitor her progress in the program. Shion is doing well in rehab. She continues to work at level 5 on the XR and level 2 on the T5 nustep. She also increased her laps on the track back up to 18 laps. We will continue to monitor her progress in the program. Kaylaann continues to work on increasein workloads and keeping her RPE and THRR  within her parameters. She increased the T5 Nustep from level 2 to level 3 this revciew period. Increased her laps by 2. REmains using 3 lb hand weights. Encourage her to increase the T4 NUstep by 1-2 levels.  Will continue to monitor exercise progression with goal of increased stamina and strength. Sinae continues to do well in rehab. She walked up to 28 laps on the track and increased her biostep level to 3. She also increased from 3 lbs to 4 lbs for resistance training. We will continue to monitor her progress in the program.   Expected Outcomes Short: Continue to walk on off days Long: Continue to improve stamina Short: Continue to push for more laps on the track. Long: Continue to improve strength and stamina. Short: Continue to push for more laps on the track. Long: Continue to improve strength and stamina. FTD:DUKGURKY workloads as tolerated with goal of increased stamina and strength. Continue to encourage slow progression.   LTG: Continued exercise progression as tolerated during program and after discharge. Short: Continue to push for more laps on the track. Long: Continue exercise to improve strength and stamina.    Row Name 07/17/23 7062 07/19/23 1446 08/01/23 1436 08/15/23 1421       Exercise Goal Re-Evaluation   Exercise Goals Review Increase Physical Activity;Increase Strength and Stamina;Understanding of Exercise Prescription Increase Physical Activity;Able to  understand and use Dyspnea scale;Understanding of Exercise Prescription;Knowledge and understanding of Target Heart Rate Range (THRR);Increase Strength and Stamina;Able to check pulse independently;Able to understand and use rate of perceived exertion (RPE) scale Increase Physical Activity;Increase Strength and Stamina;Understanding of Exercise Prescription Increase Physical Activity;Increase Strength and Stamina;Understanding of Exercise Prescription    Comments Assa continues to do well in rehab. She increased her track laps from 28 to 42, she also increased her level on the REXR from level 5 to 6. Improvements were also seen in percieved Dyspnea, and her overall average MET's increased from 2.09 last review to 3.55. We will continue to monitor her progress in the program. Reviewed home exercise with pt today.  Pt plans to walk at home around her neighborhood and stretch for exercise.  She also may join the [place].  Reviewed THR, pulse, RPE, sign and symptoms, pulse oximetery and when to call 911 or MD.  Also discussed weather considerations and indoor options.  Pt voiced understanding. Viney continues to do well in rehab. She recently increased her intensity on the upper body ergometer from 1.5 to 4. We will continue to monitor her progress in the program. Stefanie did not attend a session this review. We have been in contact with her, and she is dealing with some personal matters. We will continue to monitor her progress in the program upon return.    Expected Outcomes Short: Continue to follow current exercise prescription, and progressively increase workloads. Long: Continue exercise to improve strength and stamina. Short: Walk at home on days away from rehab. Long: Continue to exercise independently. Short: Continue to follow current exercise prescription, and progressively increase workloads. Long: Continue exercise to improve strength and stamina. Short: Return to rehab. Long: Continue exercise to improve  strength and stamina.             Discharge Exercise Prescription (Final Exercise Prescription Changes):  Exercise Prescription Changes - 08/01/23 1400       Response to Exercise   Blood Pressure (Admit) 106/60    Blood Pressure (Exit) 116/60    Heart Rate (Admit) 111 bpm    Heart  Rate (Exercise) 130 bpm    Heart Rate (Exit) 105 bpm    Oxygen Saturation (Admit) 95 %    Oxygen Saturation (Exercise) 94 %    Oxygen Saturation (Exit) 98 %    Rating of Perceived Exertion (Exercise) 13    Perceived Dyspnea (Exercise) 2    Symptoms SOB    Duration Continue with 30 min of aerobic exercise without signs/symptoms of physical distress.    Intensity THRR unchanged      Progression   Progression Continue to progress workloads to maintain intensity without signs/symptoms of physical distress.    Average METs 2.17      Resistance Training   Training Prescription Yes    Weight 4 lb    Reps 10-15      Interval Training   Interval Training No      NuStep   Level 5    Minutes 15    METs 3.8      Arm Ergometer   Level 4    Minutes 15    METs 1      REL-XR   Level 5    Minutes 15      Track   Laps 20    Minutes 15    METs 2.09      Home Exercise Plan   Plans to continue exercise at Home (comment)   Walking and stretching at home in neighborhood, potentially joining [facility]   Frequency Add 2 additional days to program exercise sessions.    Initial Home Exercises Provided 07/19/23      Oxygen   Maintain Oxygen Saturation 88% or higher             Nutrition:  Target Goals: Understanding of nutrition guidelines, daily intake of sodium 1500mg , cholesterol 200mg , calories 30% from fat and 7% or less from saturated fats, daily to have 5 or more servings of fruits and vegetables.  Education: All About Nutrition: -Group instruction provided by verbal, written material, interactive activities, discussions, models, and posters to present general guidelines for heart  healthy nutrition including fat, fiber, MyPlate, the role of sodium in heart healthy nutrition, utilization of the nutrition label, and utilization of this knowledge for meal planning. Follow up email sent as well. Written material given at graduation.   Biometrics:    Nutrition Therapy Plan and Nutrition Goals:  Nutrition Therapy & Goals - 06/21/23 1554       Nutrition Therapy   Diet Mediterranean    Protein (specify units) 90    Fiber 25 grams    Whole Grain Foods 3 servings    Saturated Fats 15 max. grams    Fruits and Vegetables 5 servings/day    Sodium 2 grams      Personal Nutrition Goals   Nutrition Goal Eat 3 times per day (carbs paired with protein)    Personal Goal #2 Limit fried foods    Comments She is drinking mostly vitamin water, some soda. Talked to her about ensuring she keep soda to a limit and meet a goal of ~64oz of water daily. She doesn't feel hungry most of the day. Will miss breakfast almost every day, dinner most days. Lunch is not always big in size but can at times be high calorie foods like pizza, fried chicken, ect. Educated on the Mediterranean diet and ways to eat more healthy foods, reduce bad fats, refined sugars, and sodium. But main focus is on getting enough nutrition with her small appetite. Build out several small  meals and snacks centered around nutrient and calorie dense foods to help her meet her nutrition goals with as few bites as needed.      Intervention Plan   Intervention Nutrition handout(s) given to patient.;Prescribe, educate and counsel regarding individualized specific dietary modifications aiming towards targeted core components such as weight, hypertension, lipid management, diabetes, heart failure and other comorbidities.    Expected Outcomes Long Term Goal: Adherence to prescribed nutrition plan.;Short Term Goal: A plan has been developed with personal nutrition goals set during dietitian appointment.;Short Term Goal: Understand basic  principles of dietary content, such as calories, fat, sodium, cholesterol and nutrients.             Nutrition Assessments:  MEDIFICTS Score Key: >=70 Need to make dietary changes  40-70 Heart Healthy Diet <= 40 Therapeutic Level Cholesterol Diet  Flowsheet Row Pulmonary Rehab from 01/26/2023 in [facility]  Picture Your Plate Total Score on Admission 47      Picture Your Plate Scores: <73 Unhealthy dietary pattern with much room for improvement. 41-50 Dietary pattern unlikely to meet recommendations for good health and room for improvement. 51-60 More healthful dietary pattern, with some room for improvement.  >60 Healthy dietary pattern, although there may be some specific behaviors that could be improved.   Nutrition Goals Re-Evaluation:  Nutrition Goals Re-Evaluation     Row Name 04/10/23 1406 05/03/23 1431 05/29/23 1407 06/19/23 1407       Goals   Current Weight 274 lb (124.3 kg) -- 264 lb (119.7 kg) 266 lb (120.7 kg)    Nutrition Goal eat smaller portions. work on Mining engineer with RD Meet with RD    Comment Patient was informed on why it is important to maintain a balanced diet when dealing with Respiratory issues. Explained that it takes a lot of energy to breath and when they are short of breath often they will need to have a good diet to help keep up with the calories they are expending for breathing. Rodney Booze declined dietitian appt. Her weight is going down.  She is eating more fruit and cut back on portion sizes.  She is trying to do better with eating smaller meals. Cydni would like to meet with RD Patient is talking to RD to set up for an appointment.    Expected Outcome Short: Choose and plan snacks accordingly to patients caloric intake to improve breathing. Long: Maintain a diet independently that meets their caloric intake to aid in daily shortness of breath. Short; Continue to add in more fruits Long: Continue to improve diet Short: Meet with  RD. Long: adhere to a diet plan that pertains to her. Short: meet with RD and lose weight. Long: lose weight and follow RD guidlines.             Nutrition Goals Discharge (Final Nutrition Goals Re-Evaluation):  Nutrition Goals Re-Evaluation - 06/19/23 1407       Goals   Current Weight 266 lb (120.7 kg)    Nutrition Goal Meet with RD    Comment Patient is talking to RD to set up for an appointment.    Expected Outcome Short: meet with RD and lose weight. Long: lose weight and follow RD guidlines.             Psychosocial: Target Goals: Acknowledge presence or absence of significant depression and/or stress, maximize coping skills, provide positive support system. Participant is able to verbalize types and ability to use techniques and skills  needed for reducing stress and depression.   Education: Stress, Anxiety, and Depression - Group verbal and visual presentation to define topics covered.  Reviews how body is impacted by stress, anxiety, and depression.  Also discusses healthy ways to reduce stress and to treat/manage anxiety and depression.  Written material given at graduation.   Education: Sleep Hygiene -Provides group verbal and written instruction about how sleep can affect your health.  Define sleep hygiene, discuss sleep cycles and impact of sleep habits. Review good sleep hygiene tips.    Initial Review & Psychosocial Screening:   Quality of Life Scores:  Scores of 19 and below usually indicate a poorer quality of life in these areas.  A difference of  2-3 points is a clinically meaningful difference.  A difference of 2-3 points in the total score of the Quality of Life Index has been associated with significant improvement in overall quality of life, self-image, physical symptoms, and general health in studies assessing change in quality of life.  PHQ-9: Review Flowsheet       05/29/2023 04/10/2023 01/26/2023  Depression screen PHQ 2/9  Decreased Interest 2 2 3    Down, Depressed, Hopeless 0 0 1  PHQ - 2 Score 2 2 4   Altered sleeping 0 0 0  Tired, decreased energy 1 3 3   Change in appetite 1 0 0  Feeling bad or failure about yourself  0 0 0  Trouble concentrating 0 0 0  Moving slowly or fidgety/restless 0 0 0  Suicidal thoughts 0 0 0  PHQ-9 Score 4 5 7   Difficult doing work/chores Somewhat difficult Somewhat difficult Somewhat difficult    Details           Interpretation of Total Score  Total Score Depression Severity:  1-4 = Minimal depression, 5-9 = Mild depression, 10-14 = Moderate depression, 15-19 = Moderately severe depression, 20-27 = Severe depression   Psychosocial Evaluation and Intervention:   Psychosocial Re-Evaluation:  Psychosocial Re-Evaluation     Row Name 04/10/23 1411 05/03/23 1429 05/29/23 1417 06/19/23 1409       Psychosocial Re-Evaluation   Current issues with History of Depression History of Depression History of Depression None Identified    Comments Reviewed patient health questionnaire (PHQ-9) with patient for follow up. Previously, patients score indicated signs/symptoms of depression.  Reviewed to see if patient is improving symptom wise while in program.  Score improved and patient states that it is because she has been able to control herhealth more. Jiayi is doing well in rehab. She is feeling better mentally and in a better place with her mood since when she was first released.  She is sleeping well. Reviewed patient health questionnaire (PHQ-9) with patient for follow up. Previously, patients score indicated signs/symptoms of depression.  Reviewed to see if patient is improving symptom wise while in program.  Score improved and patient states that it is because she has been able to have more energy. Patient reports no issues with their current mental states, sleep, stress, depression or anxiety. Will follow up with patient in a few weeks for any changes.    Expected Outcomes Short: Continue to attend  [facility] regularly for regular exercise and social engagement. Long: Continue to improve symptoms and manage a positive mental state. Short: Conitnue to exercise for mental boost LOng; Continue to stay positive Short: Continue to attend [facility] regularly for regular exercise and social engagement. Long: Continue to improve symptoms and manage a positive mental state. Short: Continue to exercise regularly  to support mental health and notify staff of any changes. Long: maintain mental health and well being through teaching of rehab or prescribed medications independently.    Interventions Encouraged to attend Pulmonary Rehabilitation for the exercise Encouraged to attend Pulmonary Rehabilitation for the exercise Encouraged to attend Pulmonary Rehabilitation for the exercise Encouraged to attend Pulmonary Rehabilitation for the exercise    Continue Psychosocial Services  Follow up required by staff Follow up required by staff Follow up required by staff Follow up required by staff             Psychosocial Discharge (Final Psychosocial Re-Evaluation):  Psychosocial Re-Evaluation - 06/19/23 1409       Psychosocial Re-Evaluation   Current issues with None Identified    Comments Patient reports no issues with their current mental states, sleep, stress, depression or anxiety. Will follow up with patient in a few weeks for any changes.    Expected Outcomes Short: Continue to exercise regularly to support mental health and notify staff of any changes. Long: maintain mental health and well being through teaching of rehab or prescribed medications independently.    Interventions Encouraged to attend Pulmonary Rehabilitation for the exercise    Continue Psychosocial Services  Follow up required by staff             Education: Education Goals: Education classes will be provided on a weekly basis, covering required topics. Participant will state understanding/return demonstration of topics  presented.  Learning Barriers/Preferences:   General Pulmonary Education Topics:  Infection Prevention: - Provides verbal and written material to individual with discussion of infection control including proper hand washing and proper equipment cleaning during exercise session. Flowsheet Row Pulmonary Rehab from 04/12/2023 in [facility] Card[facility] and Pulmonary Rehab  Date 01/16/23  Educator [facility]  Instruction Review Code 1- Verbalizes Understanding       Falls Prevention: - Provides verbal and written material to individual with discussion of falls prevention and safety. Flowsheet Row Pulmonary Rehab from 04/12/2023 in [facility] Cardiac and Pulmonary Rehab  Date 01/16/23  Educator [facility]  Instruction Review Code 1- Verbalizes Understanding       Chronic Lung Disease Review: - Group verbal instruction with posters, models, PowerPoint presentations and videos,  to review new updates, new respiratory medications, new advancements in procedures and treatments. Providing information on websites and "800" numbers for continued self-education. Includes information about supplement oxygen, available portable oxygen systems, continuous and intermittent flow rates, oxygen safety, concentrators, and Medicare reimbursement for oxygen. Explanation of Pulmonary Drugs, including class, frequency, complications, importance of spacers, rinsing mouth after steroid MDI's, and proper cleaning methods for nebulizers. Review of basic lung anatomy and physiology related to function, structure, and complications of lung disease. Review of risk factors. Discussion about methods for diagnosing sleep apnea and types of masks and machines for OSA. Includes a review of the use of types of environmental controls: home humidity, furnaces, filters, dust mite/pet prevention, HEPA vacuums. Discussion about weather changes, air quality and the benefits of nasal washing. Instruction on Warning signs, infection symptoms, calling MD promptly,  preventive modes, and value of vaccinations. Review of effective airway clearance, coughing and/or vibration techniques. Emphasizing that all should Create an Action Plan. Written material given at graduation. Flowsheet Row Pulmonary Rehab from 04/12/2023 in [facility] Cardiac and Pulmonary Rehab  Education need identified 01/26/23       AED/CPR: - Group verbal and written instruction with the use of models to demonstrate the basic use of the AED with the basic  ABC's of resuscitation.    Anatomy and Cardiac Procedures: - Group verbal and visual presentation and models provide information about basic cardiac anatomy and function. Reviews the testing methods done to diagnose heart disease and the outcomes of the test results. Describes the treatment choices: Medical Management, Angioplasty, or Coronary Bypass Surgery for treating various heart conditions including Myocardial Infarction, Angina, Valve Disease, and Cardiac Arrhythmias.  Written material given at graduation.   Medication Safety: - Group verbal and visual instruction to review commonly prescribed medications for heart and lung disease. Reviews the medication, class of the drug, and side effects. Includes the steps to properly store meds and maintain the prescription regimen.  Written material given at graduation.   Other: -Provides group and verbal instruction on various topics (see comments)   Knowledge Questionnaire Score:    Core Components/Risk Factors/Patient Goals at Admission:   Education:Diabetes - Individual verbal and written instruction to review signs/symptoms of diabetes, desired ranges of glucose level fasting, after meals and with exercise. Acknowledge that pre and post exercise glucose checks will be done for 3 sessions at entry of program.   Know Your Numbers and Heart Failure: - Group verbal and visual instruction to discuss disease risk factors for cardiac and pulmonary disease and treatment options.  Reviews  associated critical values for Overweight/Obesity, Hypertension, Cholesterol, and Diabetes.  Discusses basics of heart failure: signs/symptoms and treatments.  Introduces Heart Failure Zone chart for action plan for heart failure.  Written material given at graduation.   Core Components/Risk Factors/Patient Goals Review:   Goals and Risk Factor Review     Row Name 04/10/23 1405 05/03/23 1433 05/29/23 1409 06/19/23 1410       Core Components/Risk Factors/Patient Goals Review   Personal Goals Review Improve shortness of breath with ADL's Improve shortness of breath with ADL's;Weight Management/Obesity;Increase knowledge of respiratory medications and ability to use respiratory devices properly. Weight Management/Obesity Weight Management/Obesity    Review Spoke to patient about their shortness of breath and what they can do to improve. Patient has been informed of breathing techniques when starting the program. Patient is informed to tell staff if they have had any med changes and that certain meds they are taking or not taking can be causing shortness of breath. Charlinda is doing well with her weight and continues to work on weight loss.  Her breathing is getting better and she is no longer needing oxygen for exercise.  She is still using her nebulizer and doing well with it. Lorel would like to continue to lose weight. She would like to lose weight before her surgery in September.Rodney Booze is going to work on losing a few pounds in the next couple weeks. Jacqline states she has no questions at this time about her medications or her health. She wants to continue working on weight loss.    Expected Outcomes Short: Attend [facility] regularly to improve shortness of breath with ADL's. Long: maintain independence with ADL's Short: Continue to exercise to help with weight loss long: Continue to monitor risk factors Short: lose some weight. Long: Reach weight goal. Short: lose some weight. Long: Reach weight goal.              Core Components/Risk Factors/Patient Goals at Discharge (Final Review):   Goals and Risk Factor Review - 06/19/23 1410       Core Components/Risk Factors/Patient Goals Review   Personal Goals Review Weight Management/Obesity    Review Bryla states she has no questions at this time  about her medications or her health. She wants to continue working on weight loss.    Expected Outcomes Short: lose some weight. Long: Reach weight goal.             ITP Comments:  ITP Comments     Row Name 03/13/23 1359 03/15/23 1401 04/04/23 1159 04/12/23 0936 05/10/23 1129   ITP Comments First full day of exercise!  Patient was oriented to gym and equipment including functions, settings, policies, and procedures.  Patient's individual exercise prescription and treatment plan were reviewed.  All starting workloads were established based on the results of the 6 minute walk test done at initial orientation visit.  The plan for exercise progression was also introduced and progression will be customized based on patient's performance and goals. 30 day review completed. ITP sent to Dr. Jinny Sanders, Medical Director of  Pulmonary Rehab. Continue with ITP unless changes are made by physician. First day of exercise was just this week, pt delayed her start. Chalanda called to let us know that she was still have significant back pain.  She went to ED for it and was given a shot and pain killers.  She has been resting and icing.  She wants to give it until Monday 5/13 to try again. 30 Day review completed. Medical Director ITP review done, changes made as directed, and signed approval by Medical Director. 30 Day review completed. Medical Director ITP review done, changes made as directed, and signed approval by Medical Director.    Row Name 06/06/23 1502 07/05/23 1201 08/02/23 1114 08/30/23 1241     ITP Comments 30 Day review completed. Medical Director ITP review done, changes made as directed, and signed  approval by Medical Director. 30 Day review completed. Medical Director ITP review done, changes made as directed, and signed approval by Medical Director. 30 Day review completed. Medical Director ITP review done, changes made as directed, and signed approval by Medical Director. 30 Day review completed. Medical Director ITP review done, changes made as directed, and signed approval by Medical Director.             Comments:

## 2023-09-06 ENCOUNTER — Ambulatory Visit: Payer: Medicare Other | Admitting: Obstetrics and Gynecology

## 2023-09-06 ENCOUNTER — Encounter: Payer: Medicare Other | Attending: Pulmonary Disease | Admitting: *Deleted

## 2023-09-06 DIAGNOSIS — D219 Benign neoplasm of connective and other soft tissue, unspecified: Secondary | ICD-10-CM

## 2023-09-06 DIAGNOSIS — J4540 Moderate persistent asthma, uncomplicated: Secondary | ICD-10-CM | POA: Insufficient documentation

## 2023-09-06 DIAGNOSIS — N920 Excessive and frequent menstruation with regular cycle: Secondary | ICD-10-CM

## 2023-09-06 NOTE — Progress Notes (Signed)
Daily Session Note  Patient Details  Name: Kathy Vega MRN: [id number] Date of Birth: 31-Jan-1976 Referring Provider:   Flowsheet Row Pulmonary Rehab from 01/26/2023 in [facility] Cardiac and Pulmonary Rehab  Referring Provider Sarina Ser MD       Encounter Date: 09/06/2023  Check In:  Session Check In - 09/06/23 1352       Check-In   Supervising physician immediately available to respond to emergencies See telemetry face sheet for immediately available ER MD    Location [facility]-Cardiac & Pulmonary Rehab    Staff Present Maxon Suzzette Righter, , Exercise Physiologist;Kylei Purington Jewel Baize, RN Mabeline Caras, BS, ACSM CEP, Exercise Physiologist;Noah Tickle, BS, Exercise Physiologist    Virtual Visit No    Medication changes reported     No    Fall or balance concerns reported    No    Warm-up and Cool-down Performed on first and last piece of equipment    Resistance Training Performed Yes    VAD Patient? No    PAD/SET Patient? No      Pain Assessment   Currently in Pain? No/denies                Social History   Tobacco Use  Smoking Status Former   Current packs/day: 0.00   Average packs/day: 0.5 packs/day for 5.0 years (2.5 ttl pk-yrs)   Types: Cigarettes   Start date: 5   Quit date: 2000   Years since quitting: 24.7  Smokeless Tobacco Never    Goals Met:  Independence with exercise equipment Exercise tolerated well No report of concerns or symptoms today Strength training completed today  Goals Unmet:  Not Applicable  Comments: Pt able to follow exercise prescription today without complaint.  Will continue to monitor for progression.    Dr. Bethann Punches is Medical Director for [facility] Cardiac Rehabilitation.  Dr. Vida Rigger is Medical Director for [facility] Pulmonary Rehabilitation.

## 2023-09-11 ENCOUNTER — Encounter: Payer: Medicare Other | Admitting: *Deleted

## 2023-09-11 DIAGNOSIS — J4540 Moderate persistent asthma, uncomplicated: Secondary | ICD-10-CM

## 2023-09-11 NOTE — Progress Notes (Signed)
Daily Session Note  Patient Details  Name: Kathy Vega MRN: [id number] Date of Birth: [birth year] Referring Provider:   Flowsheet Row Pulmonary Rehab from 01/26/2023 in [facility] Cardiac and Pulmonary Rehab  Referring Provider Sarina Ser MD       Encounter Date: 09/11/2023  Check In:  Session Check In - 09/11/23 1433       Check-In   Supervising physician immediately available to respond to emergencies See telemetry face sheet for immediately available ER MD    Location [facility]-Cardiac & Pulmonary Rehab    Staff Present Maxon Suzzette Righter, , Exercise Physiologist;Jaeleigh [place] Jewel Baize, RN BSN;Joseph Hood, RCP,RRT,BSRT;Noah Tickle, BS, Exercise Physiologist    Virtual Visit No    Medication changes reported     No    Fall or balance concerns reported    No    Warm-up and Cool-down Performed on first and last piece of equipment    Resistance Training Performed Yes    VAD Patient? No    PAD/SET Patient? No      Pain Assessment   Currently in Pain? No/denies                Social History   Tobacco Use  Smoking Status Former   Current packs/day: 0.00   Average packs/day: 0.5 packs/day for 5.0 years (2.5 ttl pk-yrs)   Types: Cigarettes   Start date: 30   Quit date: 2000   Years since quitting: 24.8  Smokeless Tobacco Never    Goals Met:  Independence with exercise equipment Exercise tolerated well No report of concerns or symptoms today Strength training completed today  Goals Unmet:  Not Applicable  Comments: Pt able to follow exercise prescription today without complaint.  Will continue to monitor for progression.    Dr. Bethann Punches is Medical Director for [facility] Cardiac Rehabilitation.  Dr. Vida Rigger is Medical Director for [facility] Pulmonary Rehabilitation.

## 2023-09-13 ENCOUNTER — Encounter: Payer: Medicare Other | Admitting: *Deleted

## 2023-09-13 VITALS — Ht 69.0 in | Wt 265.6 lb

## 2023-09-13 DIAGNOSIS — J4540 Moderate persistent asthma, uncomplicated: Secondary | ICD-10-CM | POA: Diagnosis not present

## 2023-09-13 NOTE — Progress Notes (Signed)
Daily Session Note  Patient Details  Name: Kathy Vega MRN: [id number] Date of Birth: May 16, 1976 Referring Provider:   Flowsheet Row Pulmonary Rehab from 01/26/2023 in [facility] Cardiac and Pulmonary Rehab  Referring Provider Sarina Ser MD       Encounter Date: 09/13/2023  Check In:  Session Check In - 09/13/23 1353       Check-In   Supervising physician immediately available to respond to emergencies See telemetry face sheet for immediately available ER MD    Location [facility]-Cardiac & Pulmonary Rehab    Staff Present Susann Givens, RN Mabeline Caras, BS, ACSM CEP, Exercise Physiologist;Megan Katrinka Blazing, RN, Silas Flood, BS, Exercise Physiologist    Virtual Visit No    Medication changes reported     No    Fall or balance concerns reported    No    Warm-up and Cool-down Performed on first and last piece of equipment    Resistance Training Performed Yes    VAD Patient? No    PAD/SET Patient? No      Pain Assessment   Currently in Pain? No/denies                Social History   Tobacco Use  Smoking Status Former   Current packs/day: 0.00   Average packs/day: 0.5 packs/day for 5.0 years (2.5 ttl pk-yrs)   Types: Cigarettes   Start date: 20   Quit date: 2000   Years since quitting: 24.8  Smokeless Tobacco Never    Goals Met:  Independence with exercise equipment Exercise tolerated well No report of concerns or symptoms today Strength training completed today  Goals Unmet:  Not Applicable  Comments: Pt able to follow exercise prescription today without complaint.  Will continue to monitor for progression.    Dr. Bethann Punches is Medical Director for [facility] Cardiac Rehabilitation.  Dr. Vida Rigger is Medical Director for [facility] Pulmonary Rehabilitation.

## 2023-09-13 NOTE — Patient Instructions (Signed)
Discharge Patient Instructions  Patient Details  Name: Kathy Vega MRN: [id number] Date of Birth: 1976-02-20 Referring Provider:  [facility]   Number of Visits: 36  Reason for Discharge:  Patient reached a stable level of exercise. Patient independent in their exercise. Patient has met program and personal goals.  Smoking History:  Social History   Tobacco Use  Smoking Status Former   Current packs/day: 0.00   Average packs/day: 0.5 packs/day for 5.0 years (2.5 ttl pk-yrs)   Types: Cigarettes   Start date: 100   Quit date: 2000   Years since quitting: 24.8  Smokeless Tobacco Never    Diagnosis:  Moderate persistent asthma in adult without complication  Initial Exercise Prescription:   Discharge Exercise Prescription (Final Exercise Prescription Changes):  Exercise Prescription Changes - 09/01/23 1200       Response to Exercise   Blood Pressure (Admit) 102/62    Blood Pressure (Exit) 100/62    Heart Rate (Admit) 98 bpm    Heart Rate (Exercise) 121 bpm    Heart Rate (Exit) 110 bpm    Oxygen Saturation (Admit) 98 %    Oxygen Saturation (Exercise) 92 %    Oxygen Saturation (Exit) 94 %    Rating of Perceived Exertion (Exercise) 13    Perceived Dyspnea (Exercise) 1    Symptoms SOB    Duration Continue with 30 min of aerobic exercise without signs/symptoms of physical distress.    Intensity THRR unchanged      Progression   Progression Continue to progress workloads to maintain intensity without signs/symptoms of physical distress.    Average METs 2.09      Resistance Training   Training Prescription Yes    Weight 4 lb    Reps 10-15      Interval Training   Interval Training No      Oxygen   Liters RA      REL-XR   Level 6    Minutes 15      Track   Laps 20    Minutes 15    METs 2.09      Home Exercise Plan   Plans to continue exercise at Home (comment)   Walking and stretching at home in neighborhood, potentially joining  [employer]   Frequency Add 2 additional days to program exercise sessions.    Initial Home Exercises Provided 07/19/23      Oxygen   Maintain Oxygen Saturation 88% or higher             Functional Capacity:  6 Minute Walk     Row Name 09/13/23 1406         6 Minute Walk   Phase Discharge     Distance 1375 feet     Distance % Change 248 %     Distance Feet Change 980 ft     Walk Time 6 minutes     # of Rest Breaks 0     MPH 2.6     METS 3.91     RPE 13     Perceived Dyspnea  1     VO2 Peak 13.7     Symptoms No     Resting HR 89 bpm     Resting BP 110/62     Resting Oxygen Saturation  96 %     Exercise Oxygen Saturation  during 6 min walk 91 %     Max Ex. HR 126 bpm     Max Ex.  BP 138/72     2 Minute Post BP 126/70       Interval HR   1 Minute HR 110     2 Minute HR 118     3 Minute HR 120     4 Minute HR 124     5 Minute HR 125     6 Minute HR 126     2 Minute Post HR 99     Interval Heart Rate? Yes       Interval Oxygen   Interval Oxygen? Yes     Baseline Oxygen Saturation % 96 %     1 Minute Oxygen Saturation % 97 %     1 Minute Liters of Oxygen 0 L  RA     2 Minute Oxygen Saturation % 96 %     2 Minute Liters of Oxygen 0 L     3 Minute Oxygen Saturation % 95 %     3 Minute Liters of Oxygen 0 L     4 Minute Oxygen Saturation % 94 %     4 Minute Liters of Oxygen 0 L     5 Minute Oxygen Saturation % 91 %     5 Minute Liters of Oxygen 0 L     6 Minute Oxygen Saturation % 93 %     6 Minute Liters of Oxygen 0 L     2 Minute Post Oxygen Saturation % 98 %     2 Minute Post Liters of Oxygen 0 L              Nutrition & Weight - Outcomes:   Post Biometrics - 09/13/23 1412        Post  Biometrics   Height 5\' 9"  (1.753 m)    Weight 265 lb 9.6 oz (120.5 kg)    Waist Circumference 47 inches    Hip Circumference 46.5 inches    Waist to Hip Ratio 1.01 %    BMI (Calculated) 39.2    Single Leg Stand 30 seconds            Nutrition:   Nutrition Therapy & Goals - 06/21/23 1554       Nutrition Therapy   Diet Mediterranean    Protein (specify units) 90    Fiber 25 grams    Whole Grain Foods 3 servings    Saturated Fats 15 max. grams    Fruits and Vegetables 5 servings/day    Sodium 2 grams      Personal Nutrition Goals   Nutrition Goal Eat 3 times per day (carbs paired with protein)    Personal Goal #2 Limit fried foods    Comments She is drinking mostly vitamin water, some soda. Talked to her about ensuring she keep soda to a limit and meet a goal of ~64oz of water daily. She doesn't feel hungry most of the day. Will miss breakfast almost every day, dinner most days. Lunch is not always big in size but can at times be high calorie foods like pizza, fried chicken, ect. Educated on the Mediterranean diet and ways to eat more healthy foods, reduce bad fats, refined sugars, and sodium. But main focus is on getting enough nutrition with her small appetite. Build out several small meals and snacks centered around nutrient and calorie dense foods to help her meet her nutrition goals with as few bites as needed.      Intervention Plan   Intervention Nutrition handout(s) given to patient.;Prescribe,  educate and counsel regarding individualized specific dietary modifications aiming towards targeted core components such as weight, hypertension, lipid management, diabetes, heart failure and other comorbidities.    Expected Outcomes Long Term Goal: Adherence to prescribed nutrition plan.;Short Term Goal: A plan has been developed with personal nutrition goals set during dietitian appointment.;Short Term Goal: Understand basic principles of dietary content, such as calories, fat, sodium, cholesterol and nutrients.

## 2023-09-15 ENCOUNTER — Encounter: Payer: Self-pay | Admitting: Internal Medicine

## 2023-09-18 ENCOUNTER — Encounter: Payer: Medicare Other | Admitting: *Deleted

## 2023-09-18 DIAGNOSIS — J4540 Moderate persistent asthma, uncomplicated: Secondary | ICD-10-CM

## 2023-09-18 NOTE — Progress Notes (Signed)
Daily Session Note  Patient Details  Name: Kathy Vega MRN: [id number] Date of Birth: [birth year] Referring Provider:   Flowsheet Row Pulmonary Rehab from 01/26/2023 in [facility] Cardiac and Pulmonary Rehab  Referring Provider Sarina Ser MD       Encounter Date: 09/18/2023  Check In:  Session Check In - 09/18/23 1347       Check-In   Supervising physician immediately available to respond to emergencies See telemetry face sheet for immediately available ER MD    Location [facility]-Cardiac & Pulmonary Rehab    Staff Present Maxon Suzzette Righter, , Exercise Physiologist;Jaley Yan Jewel Baize, RN BSN;Joseph Hood, RCP,RRT,BSRT;Noah Tickle, BS, Exercise Physiologist    Virtual Visit No    Medication changes reported     No    Fall or balance concerns reported    No    Warm-up and Cool-down Performed on first and last piece of equipment    Resistance Training Performed Yes    VAD Patient? No    PAD/SET Patient? No      Pain Assessment   Currently in Pain? No/denies                Social History   Tobacco Use  Smoking Status Former   Current packs/day: 0.00   Average packs/day: 0.5 packs/day for 5.0 years (2.5 ttl pk-yrs)   Types: Cigarettes   Start date: 3   Quit date: 2000   Years since quitting: 24.8  Smokeless Tobacco Never    Goals Met:  Independence with exercise equipment Exercise tolerated well No report of concerns or symptoms today Strength training completed today  Goals Unmet:  Not Applicable  Comments: Pt able to follow exercise prescription today without complaint.  Will continue to monitor for progression.    Dr. Bethann Punches is Medical Director for [facility] Cardiac Rehabilitation.  Dr. Vida Rigger is Medical Director for [facility] Pulmonary Rehabilitation.

## 2023-09-21 LAB — LAB REPORT - SCANNED: EGFR: 100

## 2023-09-25 ENCOUNTER — Encounter: Payer: Medicare Other | Admitting: *Deleted

## 2023-09-25 DIAGNOSIS — J4540 Moderate persistent asthma, uncomplicated: Secondary | ICD-10-CM

## 2023-09-25 NOTE — Progress Notes (Signed)
Daily Session Note  Patient Details  Name: ENGIE PERLSTEIN MRN: [id number] Date of Birth: 1976-11-14 Referring Provider:   Flowsheet Row Pulmonary Rehab from 01/26/2023 in [facility] Cardiac and Pulmonary Rehab  Referring Provider Sarina Ser MD       Encounter Date: 09/25/2023  Check In:  Session Check In - 09/25/23 1446       Check-In   Supervising physician immediately available to respond to emergencies See telemetry face sheet for immediately available ER MD    Location [facility]-Cardiac & Pulmonary Rehab    Staff Present Susann Givens, RN BSN;Joseph [place], RCP,RRT,BSRT;Noah Tickle, [place], Exercise Physiologist;Maxon [place] BS, , Exercise Physiologist    Virtual Visit No    Medication changes reported     No    Fall or balance concerns reported    No    Warm-up and Cool-down Performed on first and last piece of equipment    Resistance Training Performed Yes    VAD Patient? No    PAD/SET Patient? No      Pain Assessment   Currently in Pain? No/denies                Social History   Tobacco Use  Smoking Status Former   Current packs/day: 0.00   Average packs/day: 0.5 packs/day for 5.0 years (2.5 ttl pk-yrs)   Types: Cigarettes   Start date: 36   Quit date: 2000   Years since quitting: 24.8  Smokeless Tobacco Never    Goals Met:  Independence with exercise equipment Exercise tolerated well No report of concerns or symptoms today Strength training completed today  Goals Unmet:  Not Applicable  Comments: Pt able to follow exercise prescription today without complaint.  Will continue to monitor for progression.    Dr. Bethann Punches is Medical Director for [facility] Cardiac Rehabilitation.  Dr. Vida Rigger is Medical Director for [facility] Pulmonary Rehabilitation.

## 2023-09-27 ENCOUNTER — Encounter: Payer: Self-pay | Admitting: *Deleted

## 2023-09-27 ENCOUNTER — Encounter: Payer: Medicare Other | Admitting: *Deleted

## 2023-09-27 DIAGNOSIS — J4540 Moderate persistent asthma, uncomplicated: Secondary | ICD-10-CM

## 2023-09-27 NOTE — Progress Notes (Signed)
Daily Session Note  Patient Details  Name: Kathy VOORHEIS MRN: [id number] Date of Birth: 1976/08/09 Referring Provider:   Flowsheet Row Pulmonary Rehab from 01/26/2023 in [facility] Cardiac and Pulmonary Rehab  Referring Provider Sarina Ser MD       Encounter Date: 09/27/2023  Check In:  Session Check In - 09/27/23 1435       Check-In   Supervising physician immediately available to respond to emergencies See telemetry face sheet for immediately available ER MD    Location [facility]-Cardiac & Pulmonary Rehab    Staff Present Cora Collum, RN, BSN, CCRP;Joseph [place], RCP,RRT,BSRT;Noah Tickle, BS, Exercise Physiologist;Megan Katrinka Blazing, RN, [place]    Virtual Visit No    Medication changes reported     No    Fall or balance concerns reported    No    Warm-up and Cool-down Performed on first and last piece of equipment    Resistance Training Performed Yes    VAD Patient? No    PAD/SET Patient? No      Pain Assessment   Currently in Pain? No/denies                Social History   Tobacco Use  Smoking Status Former   Current packs/day: 0.00   Average packs/day: 0.5 packs/day for 5.0 years (2.5 ttl pk-yrs)   Types: Cigarettes   Start date: 18   Quit date: 2000   Years since quitting: 24.8  Smokeless Tobacco Never    Goals Met:  Proper associated with RPD/PD & O2 Sat Independence with exercise equipment Exercise tolerated well No report of concerns or symptoms today  Goals Unmet:  Not Applicable  Comments: Pt able to follow exercise prescription today without complaint.  Will continue to monitor for progression.    Dr. Bethann Punches is Medical Director for [facility] Cardiac Rehabilitation.  Dr. Vida Rigger is Medical Director for [facility] Pulmonary Rehabilitation.

## 2023-09-27 NOTE — Progress Notes (Signed)
Pulmonary Individual Treatment Plan  Patient Details  Name: Kathy Vega MRN: [id number] Date of Birth: 1976/05/31 Referring Provider:   Flowsheet Row Pulmonary Rehab from 01/26/2023 in [facility] Cardiac and Pulmonary Rehab  Referring Provider Sarina Ser MD       Initial Encounter Date:  Flowsheet Row Pulmonary Rehab from 01/26/2023 in [facility] Cardiac and Pulmonary Rehab  Date 01/26/23       Visit Diagnosis: Moderate persistent asthma in adult without complication  Patient's Home Medications on Admission:  Current Outpatient Medications:    acetaminophen (TYLENOL) 500 MG tablet, Take 500 mg by mouth every 6 (six) hours as needed for headache, fever, moderate pain or mild pain., Disp: , Rfl:    albuterol (PROVENTIL) (2.5 MG/3ML) 0.083% nebulizer solution, Take 3 mLs (2.5 mg total) by nebulization every 6 (six) hours as needed for wheezing or shortness of breath., Disp: 360 mL, Rfl: 11   ascorbic acid (VITAMIN C) 500 MG tablet, Take 1 tablet (500 mg total) by mouth daily., Disp: 30 tablet, Rfl: 0   bisoprolol (ZEBETA) 5 MG tablet, Take 1 tablet (5 mg total) by mouth daily., Disp: 30 tablet, Rfl: 6   budesonide (PULMICORT) 0.5 MG/2ML nebulizer solution, Take 2 mLs (0.5 mg total) by nebulization 2 (two) times daily., Disp: 120 mL, Rfl: 5   formoterol (PERFOROMIST) 20 MCG/2ML nebulizer solution, Take 2 mLs (20 mcg total) by nebulization 2 (two) times daily. Use before the budesonide, twice a day., Disp: 120 mL, Rfl: 11   furosemide (LASIX) 20 MG tablet, Take 1 tablet (20 mg total) by mouth daily as needed for edema (shortness of breath)., Disp: 90 tablet, Rfl: 3   HYDROcodone-acetaminophen (NORCO/VICODIN) 5-325 MG tablet, Take 1 tablet by mouth every 4 (four) hours as needed for moderate pain., Disp: 12 tablet, Rfl: 0   hydrOXYzine (ATARAX) 10 MG tablet, Take 10 mg by mouth 3 (three) times daily., Disp: , Rfl:    iron polysaccharides (NIFEREX) 150 MG capsule, Take 1 capsule (150 mg total) by  mouth daily., Disp: 30 capsule, Rfl: 1   levothyroxine (SYNTHROID) 125 MCG tablet, Take 250 mcg by mouth daily., Disp: , Rfl:    meloxicam (MOBIC) 15 MG tablet, Take 1 tablet (15 mg total) by mouth daily., Disp: 30 tablet, Rfl: 0   methocarbamol (ROBAXIN) 500 MG tablet, Take 1 tablet (500 mg total) by mouth 4 (four) times daily., Disp: 30 tablet, Rfl: 0   norethindrone (AYGESTIN) 5 MG tablet, Take 2 tablets (10 mg total) by mouth daily., Disp: 30 tablet, Rfl: 1   nystatin cream (MYCOSTATIN), Apply topically 2 (two) times daily., Disp: 30 g, Rfl: 0   OXYGEN, Inhale into the lungs at bedtime as needed. Up to 4 liters, Disp: , Rfl:    pantoprazole (PROTONIX) 40 MG tablet, Take 1 tablet (40 mg total) by mouth daily., Disp: 30 tablet, Rfl: 2   propranolol (INDERAL) 10 MG tablet, Take 1 tablet (10 mg total) by mouth 3 (three) times daily as needed (palpitations)., Disp: 90 tablet, Rfl: 6  Past Medical History: Past Medical History:  Diagnosis Date   Acute encephalopathy 08/13/2020   Acute renal failure (ARF) (HCC) 08/13/2020   Anemia    Asthma    Cervical cancer (HCC)    Hypothyroidism    Iron deficiency anemia    Obesity (BMI 30-39.9)    Pneumonia due to COVID-19 virus 08/13/2020   requiring trach and prolonged hospitalization   Subglottic stenosis    Tracheal stenosis    Tracheostomy  tube present (HCC)     Tobacco Use: Social History   Tobacco Use  Smoking Status Former   Current packs/day: 0.00   Average packs/day: 0.5 packs/day for 5.0 years (2.5 ttl pk-yrs)   Types: Cigarettes   Start date: 68   Quit date: 2000   Years since quitting: 24.8  Smokeless Tobacco Never    Labs: Review Flowsheet  More data exists      Latest Ref Rng & Units 08/16/2020 08/17/2020 08/18/2020 08/20/2020 08/31/2020  Labs for ITP Cardiac and Pulmonary Rehab  Trlycerides <150 mg/dL - 1,610  - - -  PH, Arterial 7.350 - 7.450 7.40  - 7.41  7.38  7.42  7.47   PCO2 arterial 32.0 - 48.0 mmHg 44  - 49  56   48  53   Bicarbonate 20.0 - 28.0 mmol/L 27.3  - 31.1  33.1  31.1  38.6   O2 Saturation % 98.8  - 92.3  94.6  93.1  92.5     Details       Multiple values from one day are sorted in reverse-chronological order          Pulmonary Assessment Scores:   UCSD: Self-administered rating of dyspnea associated with activities of daily living (ADLs) 6-point scale (0 = "not at all" to 5 = "maximal or unable to do because of breathlessness")  Scoring Scores range from 0 to 120.  Minimally important difference is 5 units  CAT: CAT can identify the health impairment of COPD patients and is better correlated with disease progression.  CAT has a scoring range of zero to 40. The CAT score is classified into four groups of low (less than 10), medium (10 - 20), high (21-30) and very high (31-40) based on the impact level of disease on health status. A CAT score over 10 suggests significant symptoms.  A worsening CAT score could be explained by an exacerbation, poor medication adherence, poor inhaler technique, or progression of COPD or comorbid conditions.  CAT MCID is 2 points  mMRC: mMRC (Modified Medical Research Council) Dyspnea Scale is used to assess the degree of baseline functional disability in patients of respiratory disease due to dyspnea. No minimal important difference is established. A decrease in score of 1 point or greater is considered a positive change.   Pulmonary Function Assessment:   Exercise Target Goals: Exercise Program Goal: Individual exercise prescription set using results from initial 6 min walk test and THRR while considering  patient's activity barriers and safety.   Exercise Prescription Goal: Initial exercise prescription builds to 30-45 minutes a day of aerobic activity, 2-3 days per week.  Home exercise guidelines will be given to patient during program as part of exercise prescription that the participant will acknowledge.  Education: Aerobic Exercise: -  Group verbal and visual presentation on the components of exercise prescription. Introduces F.I.T.T principle from ACSM for exercise prescriptions.  Reviews F.I.T.T. principles of aerobic exercise including progression. Written material given at graduation.   Education: Resistance Exercise: - Group verbal and visual presentation on the components of exercise prescription. Introduces F.I.T.T principle from ACSM for exercise prescriptions  Reviews F.I.T.T. principles of resistance exercise including progression. Written material given at graduation.    Education: Exercise & Equipment Safety: - Individual verbal instruction and demonstration of equipment use and safety with use of the equipment. Flowsheet Row Pulmonary Rehab from 04/12/2023 in [facility] Cardiac and Pulmonary Rehab  Date 01/16/23  Educator [facility]  Instruction Review Code 1- Verbalizes Understanding  Education: Exercise Physiology & General Exercise Guidelines: - Group verbal and written instruction with models to review the exercise physiology of the cardiovascular system and associated critical values. Provides general exercise guidelines with specific guidelines to those with heart or lung disease.    Education: Flexibility, Balance, Mind/Body Relaxation: - Group verbal and visual presentation with interactive activity on the components of exercise prescription. Introduces F.I.T.T principle from ACSM for exercise prescriptions. Reviews F.I.T.T. principles of flexibility and balance exercise training including progression. Also discusses the mind body connection.  Reviews various relaxation techniques to help reduce and manage stress (i.e. Deep breathing, progressive muscle relaxation, and visualization). Balance handout provided to take home. Written material given at graduation.   Activity Barriers & Risk Stratification:   6 Minute Walk:  6 Minute Walk     Row Name 09/13/23 1406         6 Minute Walk   Phase Discharge      Distance 1375 feet     Distance % Change 248 %     Distance Feet Change 980 ft     Walk Time 6 minutes     # of Rest Breaks 0     MPH 2.6     METS 3.91     RPE 13     Perceived Dyspnea  1     VO2 Peak 13.7     Symptoms No     Resting HR 89 bpm     Resting BP 110/62     Resting Oxygen Saturation  96 %     Exercise Oxygen Saturation  during 6 min walk 91 %     Max Ex. HR 126 bpm     Max Ex. BP 138/72     2 Minute Post BP 126/70       Interval HR   1 Minute HR 110     2 Minute HR 118     3 Minute HR 120     4 Minute HR 124     5 Minute HR 125     6 Minute HR 126     2 Minute Post HR 99     Interval Heart Rate? Yes       Interval Oxygen   Interval Oxygen? Yes     Baseline Oxygen Saturation % 96 %     1 Minute Oxygen Saturation % 97 %     1 Minute Liters of Oxygen 0 L  RA     2 Minute Oxygen Saturation % 96 %     2 Minute Liters of Oxygen 0 L     3 Minute Oxygen Saturation % 95 %     3 Minute Liters of Oxygen 0 L     4 Minute Oxygen Saturation % 94 %     4 Minute Liters of Oxygen 0 L     5 Minute Oxygen Saturation % 91 %     5 Minute Liters of Oxygen 0 L     6 Minute Oxygen Saturation % 93 %     6 Minute Liters of Oxygen 0 L     2 Minute Post Oxygen Saturation % 98 %     2 Minute Post Liters of Oxygen 0 L             Oxygen Initial Assessment:   Oxygen Re-Evaluation:  Oxygen Re-Evaluation     Row Name 04/10/23 1403 05/03/23 1435 05/29/23 1412 09/11/23 1420  Program Oxygen Prescription   Program Oxygen Prescription Continuous None None None    Liters per minute 4 -- -- 0    Comments patient wanted to try room air for exercise today. now staying on room air and saturation are staying above 90% -- --      Home Oxygen   Home Oxygen Device E-Tanks;Home Concentrator None;Home Concentrator  not needing currently None;Home Concentrator None;Home Concentrator    Sleep Oxygen Prescription Continuous None None None    Liters per minute 5 -- -- 2  as needed     Home Exercise Oxygen Prescription -- None None None    Liters per minute 4 -- -- --    Home Resting Oxygen Prescription Continuous None None None    Liters per minute 4 -- -- 2  as needed    Compliance with Home Oxygen Use Yes Yes -- Yes      Goals/Expected Outcomes   Short Term Goals To learn and demonstrate proper pursed lip breathing techniques or other breathing techniques.  To learn and exhibit compliance with exercise, home and travel O2 prescription;To learn and understand importance of monitoring SPO2 with pulse oximeter and demonstrate accurate use of the pulse oximeter.;To learn and understand importance of maintaining oxygen saturations>88%;To learn and demonstrate proper pursed lip breathing techniques or other breathing techniques.  To learn and understand importance of monitoring SPO2 with pulse oximeter and demonstrate accurate use of the pulse oximeter.;To learn and understand importance of maintaining oxygen saturations>88% Other;To learn and understand importance of maintaining oxygen saturations>88%;To learn and understand importance of monitoring SPO2 with pulse oximeter and demonstrate accurate use of the pulse oximeter.    Long  Term Goals Exhibits proper breathing techniques, such as pursed lip breathing or other method taught during program session Exhibits compliance with exercise, home  and travel O2 prescription;Verbalizes importance of monitoring SPO2 with pulse oximeter and return demonstration;Maintenance of O2 saturations>88%;Compliance with respiratory medication;Exhibits proper breathing techniques, such as pursed lip breathing or other method taught during program session Verbalizes importance of monitoring SPO2 with pulse oximeter and return demonstration;Maintenance of O2 saturations>88% Maintenance of O2 saturations>88%;Verbalizes importance of monitoring SPO2 with pulse oximeter and return demonstration;Other    Comments Diaphragmatic and PLB breathing explained and  performed with patient. Patient has a better understanding of how to do these exercises to help with breathing performance and relaxation. Patient performed breathing techniques adequately and to practice further at home. Patient would like to titrate her oxygen today and see how her oxygen saturations are while exercising. Harjit is doing well in rehab.  She is no longer requiring oxygen therapy to maintain her saturations.  She continues to check them and uses her nebulizer routinely at home.  She is good about using her PLB as well.  She wants to ask about getting her trach out and plans to ask her doctor about it at end of month.  She is worried about closing off if it comes out, but really doesn't want it the rest of her life. She/has a pulse oximeter to check her oxygen saturation at home. Informed and explained why it is important to have one. Reviewed that oxygen saturations should be 88 percent and above. Patient verbalizes understanding. Melasia is doing well in Rehab. Her oxygen has been staying above 88 percent on exertion. She uses oxygen at night as needed.    Goals/Expected Outcomes Short: practice PLB and diaphragmatic breathing at home. Long: Use PLB, titrate oxygen and diaphragmatic breathing independently post  [employer]. Short: Continue to monitor saturations Long: Talk to doctor about trach removal. Short: monitor oxygen at home with exertion. Long: maintain oxygen saturations above 88 percent independently. Short: continue to monitor oxygen while exercising. Long: maintain oxygen saturations independently.             Oxygen Discharge (Final Oxygen Re-Evaluation):  Oxygen Re-Evaluation - 09/11/23 1420       Program Oxygen Prescription   Program Oxygen Prescription None    Liters per minute 0      Home Oxygen   Home Oxygen Device None;Home Concentrator    Sleep Oxygen Prescription None    Liters per minute 2   as needed   Home Exercise Oxygen Prescription None    Home Resting  Oxygen Prescription None    Liters per minute 2   as needed   Compliance with Home Oxygen Use Yes      Goals/Expected Outcomes   Short Term Goals Other;To learn and understand importance of maintaining oxygen saturations>88%;To learn and understand importance of monitoring SPO2 with pulse oximeter and demonstrate accurate use of the pulse oximeter.    Long  Term Goals Maintenance of O2 saturations>88%;Verbalizes importance of monitoring SPO2 with pulse oximeter and return demonstration;Other    Comments Caelee is doing well in Rehab. Her oxygen has been staying above 88 percent on exertion. She uses oxygen at night as needed.    Goals/Expected Outcomes Short: continue to monitor oxygen while exercising. Long: maintain oxygen saturations independently.             Initial Exercise Prescription:   Perform Capillary Blood Glucose checks as needed.  Exercise Prescription Changes:   Exercise Prescription Changes     Row Name 04/12/23 1300 04/27/23 1500 05/10/23 0900 05/25/23 1400 06/10/23 1100     Response to Exercise   Blood Pressure (Admit) 148/80 110/72 134/62 132/80 128/80   Blood Pressure (Exercise) 128/70 144/76 148/80 -- --   Blood Pressure (Exit) 112/72 104/62 122/62 136/78 122/78   Heart Rate (Admit) 90 bpm 95 bpm 95 bpm 90 bpm 92 bpm   Heart Rate (Exercise) 128 bpm 128 bpm 124 bpm 118 bpm 115 bpm   Heart Rate (Exit) 105 bpm 105 bpm 102 bpm 102 bpm 106 bpm   Oxygen Saturation (Admit) 92 % 94 % 93 % 96 % 97 %   Oxygen Saturation (Exercise) 88 % 89 % 92 % 92 % 95 %   Oxygen Saturation (Exit) 94 % 96 % 94 % 95 % 95 %   Rating of Perceived Exertion (Exercise) 13 16 13 13 13    Perceived Dyspnea (Exercise) 1 3 2 2 2    Symptoms SOB SOB SOB SOB SOB   Duration Progress to 30 minutes of  aerobic without signs/symptoms of physical distress Continue with 30 min of aerobic exercise without signs/symptoms of physical distress. Continue with 30 min of aerobic exercise without  signs/symptoms of physical distress. Continue with 30 min of aerobic exercise without signs/symptoms of physical distress. Continue with 30 min of aerobic exercise without signs/symptoms of physical distress.   Intensity THRR unchanged THRR unchanged THRR unchanged THRR unchanged THRR unchanged     Progression   Progression Continue to progress workloads to maintain intensity without signs/symptoms of physical distress. Continue to progress workloads to maintain intensity without signs/symptoms of physical distress. Continue to progress workloads to maintain intensity without signs/symptoms of physical distress. Continue to progress workloads to maintain intensity without signs/symptoms of physical distress. Continue to progress workloads to  maintain intensity without signs/symptoms of physical distress.   Average METs 1.93 2.03 3.26 2.24 2.35     Resistance Training   Training Prescription Yes Yes Yes Yes Yes   Weight 3 lb 3 lb 3 lb 3 lb 3 lb   Reps 10-15 10-15 10-15 10-15 10-15     Interval Training   Interval Training No No No No No     Oxygen   Liters RA RA RA RA RA     NuStep   Level -- 1 5 -- 3   Minutes -- 15 30 -- 15   METs -- 2.3 4.6 -- 2.6     Arm Ergometer   Level -- 1 -- -- 1   Minutes -- 15 -- -- 15   METs -- 2.1 -- -- 2.6     REL-XR   Level -- 4 5 5  --   Minutes -- 15 15 15  --   METs -- 1 3.6 3 --     T5 Nustep   Level 2 -- -- 2 --   Minutes 15 -- -- 30 --   METs 2.2 -- -- 2.2 --     Biostep-RELP   Level -- 1 -- -- --   Minutes -- 15 -- -- --   METs -- 3 -- -- --     Track   Laps 12 18 8 18 20    Minutes 15 15 15 15 15    METs 1.65 1.98 1.44 1.98 2.09     Oxygen   Maintain Oxygen Saturation 88% or higher 88% or higher 88% or higher 88% or higher 88% or higher    Row Name 07/04/23 1500 07/17/23 0900 07/19/23 1400 08/01/23 1400 09/01/23 1200     Response to Exercise   Blood Pressure (Admit) 146/90 122/60 -- 106/60 102/62   Blood Pressure (Exit) 146/98  120/98 -- 116/60 100/62   Heart Rate (Admit) 93 bpm 80 bpm -- 111 bpm 98 bpm   Heart Rate (Exercise) 122 bpm 118 bpm -- 130 bpm 121 bpm   Heart Rate (Exit) 124 bpm 105 bpm -- 105 bpm 110 bpm   Oxygen Saturation (Admit) 99 % 96 % -- 95 % 98 %   Oxygen Saturation (Exercise) 91 % 93 % -- 94 % 92 %   Oxygen Saturation (Exit) 94 % 93 % -- 98 % 94 %   Rating of Perceived Exertion (Exercise) 13 13 -- 13 13   Perceived Dyspnea (Exercise) 2 1 -- 2 1   Symptoms SOB SOB -- SOB SOB   Duration Continue with 30 min of aerobic exercise without signs/symptoms of physical distress. Continue with 30 min of aerobic exercise without signs/symptoms of physical distress. -- Continue with 30 min of aerobic exercise without signs/symptoms of physical distress. Continue with 30 min of aerobic exercise without signs/symptoms of physical distress.   Intensity THRR unchanged THRR unchanged -- THRR unchanged THRR unchanged     Progression   Progression Continue to progress workloads to maintain intensity without signs/symptoms of physical distress. Continue to progress workloads to maintain intensity without signs/symptoms of physical distress. -- Continue to progress workloads to maintain intensity without signs/symptoms of physical distress. Continue to progress workloads to maintain intensity without signs/symptoms of physical distress.   Average METs 2.09 3.55 -- 2.17 2.09     Resistance Training   Training Prescription Yes Yes -- Yes Yes   Weight 4 lb 4 lb -- 4 lb 4 lb   Reps 10-15 10-15 --  10-15 10-15     Interval Training   Interval Training No No -- No No     Oxygen   Liters RA RA -- -- RA     NuStep   Level -- -- -- 5 --   Minutes -- -- -- 15 --   METs -- -- -- 3.8 --     Arm Ergometer   Level 1.5 1.5 -- 4 --   Minutes 15 15 -- 15 --   METs 1 2.4 -- 1 --     REL-XR   Level -- 6 -- 5 6   Minutes -- 15 -- 15 15   METs -- 5.8 -- -- --     Biostep-RELP   Level 3 3 -- -- --   Minutes 15 15 -- --  --   METs 3 3 -- -- --     Track   Laps 28 42 -- 20 20   Minutes 15 15 -- 15 15   METs 2.52 3.28 -- 2.09 2.09     Home Exercise Plan   Plans to continue exercise at -- -- Home (comment)  Walking and stretching at home in neighborhood, potentially joining [facility] Home (comment)  Walking and stretching at home in neighborhood, potentially joining [facility] Home (comment)  Walking and stretching at home in neighborhood, potentially joining [facility]   Frequency -- -- Add 2 additional days to program exercise sessions. Add 2 additional days to program exercise sessions. Add 2 additional days to program exercise sessions.   Initial Home Exercises Provided -- -- 07/19/23 07/19/23 07/19/23     Oxygen   Maintain Oxygen Saturation 88% or higher 88% or higher 88% or higher 88% or higher 88% or higher    Row Name 09/15/23 1100             Response to Exercise   Blood Pressure (Admit) 120/62       Blood Pressure (Exit) 118/80       Heart Rate (Admit) 87 bpm       Heart Rate (Exercise) 113 bpm       Heart Rate (Exit) 80 bpm       Oxygen Saturation (Admit) 97 %       Oxygen Saturation (Exercise) 94 %       Oxygen Saturation (Exit) 97 %       Rating of Perceived Exertion (Exercise) 13       Perceived Dyspnea (Exercise) 1       Symptoms SOB       Duration Continue with 30 min of aerobic exercise without signs/symptoms of physical distress.       Intensity THRR unchanged         Progression   Progression Continue to progress workloads to maintain intensity without signs/symptoms of physical distress.       Average METs 2.43         Resistance Training   Training Prescription Yes       Weight 4 lb       Reps 10-15         Interval Training   Interval Training No         Oxygen   Liters RA         Arm Ergometer   Level 1.3       Minutes 15       METs 2.6         Track   Laps 23  Minutes 15       METs 2.25         Home Exercise Plan   Plans to continue exercise at Home  (comment)  Walking and stretching at home in neighborhood, potentially joining [employer]       Frequency Add 2 additional days to program exercise sessions.       Initial Home Exercises Provided 07/19/23         Oxygen   Maintain Oxygen Saturation 88% or higher                Exercise Comments:   Exercise Goals and Review:   Exercise Goals Re-Evaluation :  Exercise Goals Re-Evaluation     Row Name 04/12/23 1329 04/27/23 1541 05/03/23 1427 05/10/23 0931 05/25/23 1417     Exercise Goal Re-Evaluation   Exercise Goals Review Increase Physical Activity;Increase Strength and Stamina;Understanding of Exercise Prescription Increase Physical Activity;Increase Strength and Stamina;Understanding of Exercise Prescription Increase Physical Activity;Increase Strength and Stamina;Understanding of Exercise Prescription Increase Physical Activity;Increase Strength and Stamina;Understanding of Exercise Prescription Increase Physical Activity;Increase Strength and Stamina;Understanding of Exercise Prescription   Comments Shatara has only completed one session since the last review and has been inconsistent with her attendance. She did perform her exercise without oxygen during his last session and did well on room air. She was able to keep her O2 saturation above 88% as well. She also improved to level 2 on the T5 nustep and walked up to 12 laps on the track. We will continue to monitor her progress in the program. Kingston is doing well in rehab. She recently increased her overall average MET level to 2.03 METs. She also has continued to do well with exercise without oxygen, and her O2 saturation has only dropped below 90% once since the last review. She walked up to 18 laps as well, and improved to level 4 on the XR. We will continue to monitor her progress in the program. Tkai is doing well in rehab. She is exercising some on her off days from rehab by walking.  She is doing well with it; her breathing is her  biggest limitation, but it is doing well.  She has noticed that her stamina is getting better with being in rehab. Keyarra continues to do well in rehab. She was able to improve to level 5 on both the T4 nustep and the XR. She also has continued to do well walking on the track without oxygen, as her O2 saturations have not dropped below 92%. She also increased her overall average MET level to 3.26 METs. We will continue to monitor her progress in the program. Renesme is doing well in rehab. She continues to work at level 5 on the XR and level 2 on the T5 nustep. She also increased her laps on the track back up to 18 laps. We will continue to monitor her progress in the program.   Expected Outcomes Short: Attend rehab more consistently, progressively increase workloads. Long: Continue to improve strength and stamina. Short: Continue to progressively increase workloads. Long: Continue to increase overall MET level and stamina. Short: Continue to walk on off days Long: Continue to improve stamina Short: Continue to push for more laps on the track. Long: Continue to improve strength and stamina. Short: Continue to push for more laps on the track. Long: Continue to improve strength and stamina.    Row Name 06/10/23 1122 07/04/23 1517 07/17/23 0921 07/19/23 1446 08/01/23 1436     Exercise  Goal Re-Evaluation   Exercise Goals Review Increase Physical Activity;Increase Strength and Stamina;Understanding of Exercise Prescription Increase Physical Activity;Increase Strength and Stamina;Understanding of Exercise Prescription Increase Physical Activity;Increase Strength and Stamina;Understanding of Exercise Prescription Increase Physical Activity;Able to understand and use Dyspnea scale;Understanding of Exercise Prescription;Knowledge and understanding of Target Heart Rate Range (THRR);Increase Strength and Stamina;Able to check pulse independently;Able to understand and use rate of perceived exertion (RPE) scale Increase  Physical Activity;Increase Strength and Stamina;Understanding of Exercise Prescription   Comments Pamlea continues to work on increasein workloads and keeping her RPE and THRR within her parameters. She increased the T5 Nustep from level 2 to level 3 this revciew period. Increased her laps by 2. REmains using 3 lb hand weights. Encourage her to increase the T4 NUstep by 1-2 levels.  Will continue to monitor exercise progression with goal of increased stamina and strength. Ohana continues to do well in rehab. She walked up to 28 laps on the track and increased her biostep level to 3. She also increased from 3 lbs to 4 lbs for resistance training. We will continue to monitor her progress in the program. Shawnisha continues to do well in rehab. She increased her track laps from 28 to 42, she also increased her level on the REXR from level 5 to 6. Improvements were also seen in percieved Dyspnea, and her overall average MET's increased from 2.09 last review to 3.55. We will continue to monitor her progress in the program. Reviewed home exercise with pt today.  Pt plans to walk at home around her neighborhood and stretch for exercise.  She also may join the [facility].  Reviewed THR, pulse, RPE, sign and symptoms, pulse oximetery and when to call 911 or MD.  Also discussed weather considerations and indoor options.  Pt voiced understanding. Amran continues to do well in rehab. She recently increased her intensity on the upper body ergometer from 1.5 to 4. We will continue to monitor her progress in the program.   Expected Outcomes VOZ:[id number] workloads as tolerated with goal of increased stamina and strength. Continue to encourage slow progression.   LTG: Continued exercise progression as tolerated during program and after discharge. Short: Continue to push for more laps on the track. Long: Continue exercise to improve strength and stamina. Short: Continue to follow current exercise prescription, and progressively increase  workloads. Long: Continue exercise to improve strength and stamina. Short: Walk at home on days away from rehab. Long: Continue to exercise independently. Short: Continue to follow current exercise prescription, and progressively increase workloads. Long: Continue exercise to improve strength and stamina.    Row Name 08/15/23 1421 09/01/23 1218 09/15/23 1116         Exercise Goal Re-Evaluation   Exercise Goals Review Increase Physical Activity;Increase Strength and Stamina;Understanding of Exercise Prescription Increase Physical Activity;Increase Strength and Stamina;Understanding of Exercise Prescription Increase Physical Activity;Increase Strength and Stamina;Understanding of Exercise Prescription     Comments Kamariyah did not attend a session this review. We have been in contact with her, and she is dealing with some personal matters. We will continue to monitor her progress in the program upon return. Liana has only attended one session since the last review. During that session she was able to walk 20 laps on the track and improve back up to level 6 on the XR. She is due for her post when she returns and will look to improve on it. We will continue to monitor her progress in the program. Raylan has only  attended one session since the last review. During that session she was able to walk 23 laps on the track and work at level 1.3 on the arm crank. She also completed her post and improved by 248%! We will continue to monitor her progress in the program.     Expected Outcomes Short: Return to rehab. Long: Continue exercise to improve strength and stamina. Short: Attend rehab consistently, improve on post . Long: Continue exercise to improve strength and stamina. Short: Graduate. Long: Continue to exercise indpendently.              Discharge Exercise Prescription (Final Exercise Prescription Changes):  Exercise Prescription Changes - 09/15/23 1100       Response to Exercise   Blood  Pressure (Admit) 120/62    Blood Pressure (Exit) 118/80    Heart Rate (Admit) 87 bpm    Heart Rate (Exercise) 113 bpm    Heart Rate (Exit) 80 bpm    Oxygen Saturation (Admit) 97 %    Oxygen Saturation (Exercise) 94 %    Oxygen Saturation (Exit) 97 %    Rating of Perceived Exertion (Exercise) 13    Perceived Dyspnea (Exercise) 1    Symptoms SOB    Duration Continue with 30 min of aerobic exercise without signs/symptoms of physical distress.    Intensity THRR unchanged      Progression   Progression Continue to progress workloads to maintain intensity without signs/symptoms of physical distress.    Average METs 2.43      Resistance Training   Training Prescription Yes    Weight 4 lb    Reps 10-15      Interval Training   Interval Training No      Oxygen   Liters RA      Arm Ergometer   Level 1.3    Minutes 15    METs 2.6      Track   Laps 23    Minutes 15    METs 2.25      Home Exercise Plan   Plans to continue exercise at Home (comment)   Walking and stretching at home in neighborhood, potentially joining [employer]   Frequency Add 2 additional days to program exercise sessions.    Initial Home Exercises Provided 07/19/23      Oxygen   Maintain Oxygen Saturation 88% or higher             Nutrition:  Target Goals: Understanding of nutrition guidelines, daily intake of sodium 1500mg , cholesterol 200mg , calories 30% from fat and 7% or less from saturated fats, daily to have 5 or more servings of fruits and vegetables.  Education: All About Nutrition: -Group instruction provided by verbal, written material, interactive activities, discussions, models, and posters to present general guidelines for heart healthy nutrition including fat, fiber, MyPlate, the role of sodium in heart healthy nutrition, utilization of the nutrition label, and utilization of this knowledge for meal planning. Follow up email sent as well. Written material given at  graduation.   Biometrics:   Post Biometrics - 09/13/23 1412        Post  Biometrics   Height 5\' 9"  (1.753 m)    Weight 265 lb 9.6 oz (120.5 kg)    Waist Circumference 47 inches    Hip Circumference 46.5 inches    Waist to Hip Ratio 1.01 %    BMI (Calculated) 39.2    Single Leg Stand 30 seconds  Nutrition Therapy Plan and Nutrition Goals:  Nutrition Therapy & Goals - 06/21/23 1554       Nutrition Therapy   Diet Mediterranean    Protein (specify units) 90    Fiber 25 grams    Whole Grain Foods 3 servings    Saturated Fats 15 max. grams    Fruits and Vegetables 5 servings/day    Sodium 2 grams      Personal Nutrition Goals   Nutrition Goal Eat 3 times per day (carbs paired with protein)    Personal Goal #2 Limit fried foods    Comments She is drinking mostly vitamin water, some soda. Talked to her about ensuring she keep soda to a limit and meet a goal of ~64oz of water daily. She doesn't feel hungry most of the day. Will miss breakfast almost every day, dinner most days. Lunch is not always big in size but can at times be high calorie foods like pizza, fried chicken, ect. Educated on the Mediterranean diet and ways to eat more healthy foods, reduce bad fats, refined sugars, and sodium. But main focus is on getting enough nutrition with her small appetite. Build out several small meals and snacks centered around nutrient and calorie dense foods to help her meet her nutrition goals with as few bites as needed.      Intervention Plan   Intervention Nutrition handout(s) given to patient.;Prescribe, educate and counsel regarding individualized specific dietary modifications aiming towards targeted core components such as weight, hypertension, lipid management, diabetes, heart failure and other comorbidities.    Expected Outcomes Long Term Goal: Adherence to prescribed nutrition plan.;Short Term Goal: A plan has been developed with personal nutrition goals set during  dietitian appointment.;Short Term Goal: Understand basic principles of dietary content, such as calories, fat, sodium, cholesterol and nutrients.             Nutrition Assessments:  MEDIFICTS Score Key: >=70 Need to make dietary changes  40-70 Heart Healthy Diet <= 40 Therapeutic Level Cholesterol Diet  Flowsheet Row Pulmonary Rehab from 01/26/2023 in [facility] Cardiac and Pulmonary Rehab  Picture Your Plate Total Score on Admission 47      Picture Your Plate Scores: <40 Unhealthy dietary pattern with much room for improvement. 41-50 Dietary pattern unlikely to meet recommendations for good health and room for improvement. 51-60 More healthful dietary pattern, with some room for improvement.  >60 Healthy dietary pattern, although there may be some specific behaviors that could be improved.   Nutrition Goals Re-Evaluation:  Nutrition Goals Re-Evaluation     Row Name 04/10/23 1406 05/03/23 1431 05/29/23 1407 06/19/23 1407 09/11/23 1429     Goals   Current Weight 274 lb (124.3 kg) -- 264 lb (119.7 kg) 266 lb (120.7 kg) 268 lb (121.6 kg)   Nutrition Goal eat smaller portions. work on Mining engineer with RD Meet with RD Lose weight, eat smaller meals   Comment Patient was informed on why it is important to maintain a balanced diet when dealing with Respiratory issues. Explained that it takes a lot of energy to breath and when they are short of breath often they will need to have a good diet to help keep up with the calories they are expending for breathing. Rodney Booze declined dietitian appt. Her weight is going down.  She is eating more fruit and cut back on portion sizes.  She is trying to do better with eating smaller meals. Dai would like to meet with RD Patient is talking to  RD to set up for an appointment. Corey is trying to eat smaller meals and lose some weight   Expected Outcome Short: Choose and plan snacks accordingly to patients caloric intake to improve breathing. Long: Maintain a  diet independently that meets their caloric intake to aid in daily shortness of breath. Short; Continue to add in more fruits Long: Continue to improve diet Short: Meet with RD. Long: adhere to a diet plan that pertains to her. Short: meet with RD and lose weight. Long: lose weight and follow RD guidlines. Short: eat smaller meals. Long: continue to work [employer].            Nutrition Goals Discharge (Final Nutrition Goals Re-Evaluation):  Nutrition Goals Re-Evaluation - 09/11/23 1429       Goals   Current Weight 268 lb (121.6 kg)    Nutrition Goal Lose weight, eat smaller meals    Comment Marykatherine is trying to eat smaller meals and lose some weight    Expected Outcome Short: eat smaller meals. Long: continue to work [employer].             Psychosocial: Target Goals: Acknowledge presence or absence of significant depression and/or stress, maximize coping skills, provide positive support system. Participant is able to verbalize types and ability to use techniques and skills needed for reducing stress and depression.   Education: Stress, Anxiety, and Depression - Group verbal and visual presentation to define topics covered.  Reviews how body is impacted by stress, anxiety, and depression.  Also discusses healthy ways to reduce stress and to treat/manage anxiety and depression.  Written material given at graduation.   Education: Sleep Hygiene -Provides group verbal and written instruction about how sleep can affect your health.  Define sleep hygiene, discuss sleep cycles and impact of sleep habits. Review good sleep hygiene tips.    Initial Review & Psychosocial Screening:   Quality of Life Scores:  Scores of 19 and below usually indicate a poorer quality of life in these areas.  A difference of  2-3 points is a clinically meaningful difference.  A difference of 2-3 points in the total score of the Quality of Life Index has been associated with significant  improvement in overall quality of life, self-image, physical symptoms, and general health in studies assessing change in quality of life.  PHQ-9: Review Flowsheet       05/29/2023 04/10/2023 01/26/2023  Depression screen PHQ 2/9  Decreased Interest 2 2 3   Down, Depressed, Hopeless 0 0 1  PHQ - 2 Score 2 2 4   Altered sleeping 0 0 0  Tired, decreased energy 1 3 3   Change in appetite 1 0 0  Feeling bad or failure about yourself  0 0 0  Trouble concentrating 0 0 0  Moving slowly or fidgety/restless 0 0 0  Suicidal thoughts 0 0 0  PHQ-9 Score 4 5 7   Difficult doing work/chores Somewhat difficult Somewhat difficult Somewhat difficult    Details           Interpretation of Total Score  Total Score Depression Severity:  1-4 = Minimal depression, 5-9 = Mild depression, 10-14 = Moderate depression, 15-19 = Moderately severe depression, 20-27 = Severe depression   Psychosocial Evaluation and Intervention:   Psychosocial Re-Evaluation:  Psychosocial Re-Evaluation     Row Name 04/10/23 1411 05/03/23 1429 05/29/23 1417 06/19/23 1409 09/11/23 1432     Psychosocial Re-Evaluation   Current issues with History of Depression History of Depression History  of Depression None Identified None Identified   Comments Reviewed patient health questionnaire (PHQ-9) with patient for follow up. Previously, patients score indicated signs/symptoms of depression.  Reviewed to see if patient is improving symptom wise while in program.  Score improved and patient states that it is because she has been able to control herhealth more. Floella is doing well in rehab. She is feeling better mentally and in a better place with her mood since when she was first released.  She is sleeping well. Reviewed patient health questionnaire (PHQ-9) with patient for follow up. Previously, patients score indicated signs/symptoms of depression.  Reviewed to see if patient is improving symptom wise while in program.  Score improved and  patient states that it is because she has been able to have more energy. Patient reports no issues with their current mental states, sleep, stress, depression or anxiety. Will follow up with patient in a few weeks for any changes. Patient reports no issues with their current mental states, sleep, stress, depression or anxiety. Will follow up with patient in a few weeks for any changes.   Expected Outcomes Short: Continue to attend [facility] regularly for regular exercise and social engagement. Long: Continue to improve symptoms and manage a positive mental state. Short: Conitnue to exercise for mental boost LOng; Continue to stay positive Short: Continue to attend [facility] regularly for regular exercise and social engagement. Long: Continue to improve symptoms and manage a positive mental state. Short: Continue to exercise regularly to support mental health and notify staff of any changes. Long: maintain mental health and well being through teaching of rehab or prescribed medications independently. Short: Continue to exercise regularly to support mental health and notify staff of any changes. Long: maintain mental health and well being through teaching of rehab or prescribed medications independently.   Interventions Encouraged to attend Pulmonary Rehabilitation for the exercise Encouraged to attend Pulmonary Rehabilitation for the exercise Encouraged to attend Pulmonary Rehabilitation for the exercise Encouraged to attend Pulmonary Rehabilitation for the exercise Encouraged to attend Pulmonary Rehabilitation for the exercise   Continue Psychosocial Services  Follow up required by staff Follow up required by staff Follow up required by staff Follow up required by staff Follow up required by staff            Psychosocial Discharge (Final Psychosocial Re-Evaluation):  Psychosocial Re-Evaluation - 09/11/23 1432       Psychosocial Re-Evaluation   Current issues with None Identified     Comments Patient reports no issues with their current mental states, sleep, stress, depression or anxiety. Will follow up with patient in a few weeks for any changes.    Expected Outcomes Short: Continue to exercise regularly to support mental health and notify staff of any changes. Long: maintain mental health and well being through teaching of rehab or prescribed medications independently.    Interventions Encouraged to attend Pulmonary Rehabilitation for the exercise    Continue Psychosocial Services  Follow up required by staff             Education: Education Goals: Education classes will be provided on a weekly basis, covering required topics. Participant will state understanding/return demonstration of topics presented.  Learning Barriers/Preferences:   General Pulmonary Education Topics:  Infection Prevention: - Provides verbal and written material to individual with discussion of infection control including proper hand washing and proper equipment cleaning during exercise session. Flowsheet Row Pulmonary Rehab from 04/12/2023 in [facility] Cardiac and Pulmonary Rehab  Date 01/16/23  Educator [facility]  Instruction Review Code 1- Verbalizes Understanding       Falls Prevention: - Provides verbal and written material to individual with discussion of falls prevention and safety. Flowsheet Row Pulmonary Rehab from 04/12/2023 in [facility] Cardiac and Pulmonary Rehab  Date 01/16/23  Educator [facility]  Instruction Review Code 1- Verbalizes Understanding       Chronic Lung Disease Review: - Group verbal instruction with posters, models, PowerPoint presentations and videos,  to review new updates, new respiratory medications, new advancements in procedures and treatments. Providing information on websites and "800" numbers for continued self-education. Includes information about supplement oxygen, available portable oxygen systems, continuous and intermittent flow rates, oxygen safety, concentrators,  and Medicare reimbursement for oxygen. Explanation of Pulmonary Drugs, including class, frequency, complications, importance of spacers, rinsing mouth after steroid MDI's, and proper cleaning methods for nebulizers. Review of basic lung anatomy and physiology related to function, structure, and complications of lung disease. Review of risk factors. Discussion about methods for diagnosing sleep apnea and types of masks and machines for OSA. Includes a review of the use of types of environmental controls: home humidity, furnaces, filters, dust mite/pet prevention, HEPA vacuums. Discussion about weather changes, air quality and the benefits of nasal washing. Instruction on Warning signs, infection symptoms, calling MD promptly, preventive modes, and value of vaccinations. Review of effective airway clearance, coughing and/or vibration techniques. Emphasizing that all should Create an Action Plan. Written material given at graduation. Flowsheet Row Pulmonary Rehab from 04/12/2023 in [facility] Cardiac and Pulmonary Rehab  Education need identified 01/26/23       AED/CPR: - Group verbal and written instruction with the use of models to demonstrate the basic use of the AED with the basic ABC's of resuscitation.    Anatomy and Cardiac Procedures: - Group verbal and visual presentation and models provide information about basic cardiac anatomy and function. Reviews the testing methods done to diagnose heart disease and the outcomes of the test results. Describes the treatment choices: Medical Management, Angioplasty, or Coronary Bypass Surgery for treating various heart conditions including Myocardial Infarction, Angina, Valve Disease, and Cardiac Arrhythmias.  Written material given at graduation.   Medication Safety: - Group verbal and visual instruction to review commonly prescribed medications for heart and lung disease. Reviews the medication, class of the drug, and side effects. Includes the steps to  properly store meds and maintain the prescription regimen.  Written material given at graduation.   Other: -Provides group and verbal instruction on various topics (see comments)   Knowledge Questionnaire Score:    Core Components/Risk Factors/Patient Goals at Admission:   Education:Diabetes - Individual verbal and written instruction to review signs/symptoms of diabetes, desired ranges of glucose level fasting, after meals and with exercise. Acknowledge that pre and post exercise glucose checks will be done for 3 sessions at entry of program.   Know Your Numbers and Heart Failure: - Group verbal and visual instruction to discuss disease risk factors for cardiac and pulmonary disease and treatment options.  Reviews associated critical values for Overweight/Obesity, Hypertension, Cholesterol, and Diabetes.  Discusses basics of heart failure: signs/symptoms and treatments.  Introduces Heart Failure Zone chart for action plan for heart failure.  Written material given at graduation.   Core Components/Risk Factors/Patient Goals Review:   Goals and Risk Factor Review     Row Name 04/10/23 1405 05/03/23 1433 05/29/23 1409 06/19/23 1410 09/11/23 1425     Core Components/Risk Factors/Patient Goals Review   Personal Goals Review Improve shortness of breath with  ADL's Improve shortness of breath with ADL's;Weight Management/Obesity;Increase knowledge of respiratory medications and ability to use respiratory devices properly. Weight Management/Obesity Weight Management/Obesity Weight Management/Obesity   Review Spoke to patient about their shortness of breath and what they can do to improve. Patient has been informed of breathing techniques when starting the program. Patient is informed to tell staff if they have had any med changes and that certain meds they are taking or not taking can be causing shortness of breath. Tynlee is doing well with her weight and continues to work on weight loss.  Her  breathing is getting better and she is no longer needing oxygen for exercise.  She is still using her nebulizer and doing well with it. Tannika would like to continue to lose weight. She would like to lose weight before her surgery in September.Rodney Booze is going to work on losing a few pounds in the next couple weeks. Jadrian states she has no questions at this time about her medications or her health. She wants to continue working on weight loss. Charnita wants to lose weight before her surgery in January. She wants to try to lose at least 50 pounds.   Expected Outcomes Short: Attend [facility] regularly to improve shortness of breath with ADL's. Long: maintain independence with ADL's Short: Continue to exercise to help with weight loss long: Continue to monitor risk factors Short: lose some weight. Long: Reach weight goal. Short: lose some weight. Long: Reach weight goal. Short: lose a few pounds in a couple weeks. Long: reach weight goal.            Core Components/Risk Factors/Patient Goals at Discharge (Final Review):   Goals and Risk Factor Review - 09/11/23 1425       Core Components/Risk Factors/Patient Goals Review   Personal Goals Review Weight Management/Obesity    Review Everleaner wants to lose weight before her surgery in January. She wants to try to lose at least 50 pounds.    Expected Outcomes Short: lose a few pounds in a couple weeks. Long: reach weight goal.             ITP Comments:  ITP Comments     Row Name 04/04/23 1159 04/12/23 0936 05/10/23 1129 06/06/23 1502 07/05/23 1201   ITP Comments Ellen called to let us know that she was still have significant back pain.  She went to ED for it and was given a shot and pain killers.  She has been resting and icing.  She wants to give it until Monday 5/13 to try again. 30 Day review completed. Medical Director ITP review done, changes made as directed, and signed approval by Medical Director. 30 Day review completed. Medical Director ITP  review done, changes made as directed, and signed approval by Medical Director. 30 Day review completed. Medical Director ITP review done, changes made as directed, and signed approval by Medical Director. 30 Day review completed. Medical Director ITP review done, changes made as directed, and signed approval by Medical Director.    Row Name 08/02/23 1114 08/30/23 1241 09/27/23 0856       ITP Comments 30 Day review completed. Medical Director ITP review done, changes made as directed, and signed approval by Medical Director. 30 Day review completed. Medical Director ITP review done, changes made as directed, and signed approval by Medical Director. 30 Day review completed. Medical Director ITP review done, changes made as directed, and signed approval by Medical Director.  Comments:

## 2023-10-02 ENCOUNTER — Encounter: Payer: 59 | Attending: Pulmonary Disease | Admitting: *Deleted

## 2023-10-02 DIAGNOSIS — J4540 Moderate persistent asthma, uncomplicated: Secondary | ICD-10-CM | POA: Insufficient documentation

## 2023-10-02 NOTE — Progress Notes (Signed)
Daily Session Note  Patient Details  Name: Kathy Vega MRN: [id number] Date of Birth: 03-22-1976 Referring Provider:   Flowsheet Row Pulmonary Rehab from 01/26/2023 in [facility] Cardiac and Pulmonary Rehab  Referring Provider Sarina Ser MD       Encounter Date: 10/02/2023  Check In:  Session Check In - 10/02/23 1355       Check-In   Supervising physician immediately available to respond to emergencies See telemetry face sheet for immediately available ER MD    Location [facility]-Cardiac & Pulmonary Rehab    Staff Present Maxon Suzzette Righter, , Exercise Physiologist;Sherlonda Flater Jewel Baize, RN BSN;Joseph Hood, RCP,RRT,BSRT;Noah Tickle, BS, Exercise Physiologist    Virtual Visit No    Medication changes reported     No    Fall or balance concerns reported    No    Warm-up and Cool-down Performed on first and last piece of equipment    Resistance Training Performed Yes    VAD Patient? No    PAD/SET Patient? No      Pain Assessment   Currently in Pain? No/denies                Social History   Tobacco Use  Smoking Status Former   Current packs/day: 0.00   Average packs/day: 0.5 packs/day for 5.0 years (2.5 ttl pk-yrs)   Types: Cigarettes   Start date: 58   Quit date: 2000   Years since quitting: 24.8  Smokeless Tobacco Never    Goals Met:  Independence with exercise equipment Exercise tolerated well No report of concerns or symptoms today Strength training completed today  Goals Unmet:  Not Applicable  Comments: Pt able to follow exercise prescription today without complaint.  Will continue to monitor for progression.    Dr. Bethann Punches is Medical Director for [facility] Cardiac Rehabilitation.  Dr. Vida Rigger is Medical Director for [facility] Pulmonary Rehabilitation.

## 2023-10-04 ENCOUNTER — Encounter: Payer: 59 | Admitting: *Deleted

## 2023-10-04 DIAGNOSIS — J4540 Moderate persistent asthma, uncomplicated: Secondary | ICD-10-CM

## 2023-10-04 NOTE — Progress Notes (Signed)
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 Date of Birth: [birth year] Referring Provider:   Flowsheet Row Pulmonary Rehab from 01/26/2023 in [facility] Cardiac and Pulmonary Rehab  Referring Provider Sarina Ser MD       Encounter Date: 10/04/2023  Check In:  Session Check In - 10/04/23 1348       Check-In   Supervising physician immediately available to respond to emergencies See telemetry face sheet for immediately available ER MD    Location [facility]-Cardiac & Pulmonary Rehab    Staff Present Maxon Conetta BS, , Exercise Physiologist;Jasmane Brockway Jewel Baize, RN BSN;Noah Tickle, BS, Exercise Physiologist;Kelly Madilyn Fireman, BS, ACSM CEP, Exercise Physiologist    Virtual Visit No    Medication changes reported     No    Fall or balance concerns reported    No    Warm-up and Cool-down Performed on first and last piece of equipment    Resistance Training Performed Yes    VAD Patient? No    PAD/SET Patient? No      Pain Assessment   Currently in Pain? No/denies                Social History   Tobacco Use  Smoking Status Former   Current packs/day: 0.00   Average packs/day: 0.5 packs/day for 5.0 years (2.5 ttl pk-yrs)   Types: Cigarettes   Start date: 23   Quit date: 2000   Years since quitting: 24.8  Smokeless Tobacco Never    Goals Met:  Independence with exercise equipment Exercise tolerated well No report of concerns or symptoms today Strength training completed today  Goals Unmet:  Not Applicable  Comments: Pt able to follow exercise prescription today without complaint.  Will continue to monitor for progression.    Dr. Bethann Punches is Medical Director for [facility] Cardiac Rehabilitation.  Dr. Vida Rigger is Medical Director for [facility] Pulmonary Rehabilitation.

## 2023-10-06 ENCOUNTER — Encounter: Payer: Self-pay | Admitting: Intensive Care

## 2023-10-06 ENCOUNTER — Other Ambulatory Visit
Admission: RE | Admit: 2023-10-06 | Discharge: 2023-10-06 | Disposition: A | Discharge status: Home / Self Care | Payer: Medicare Other | Source: Ambulatory Visit | Attending: Medical Genetics | Admitting: Medical Genetics

## 2023-10-06 ENCOUNTER — Other Ambulatory Visit: Payer: Self-pay

## 2023-10-06 ENCOUNTER — Emergency Department
Admission: EM | Admit: 2023-10-06 | Discharge: 2023-10-06 | Disposition: A | Discharge status: Home / Self Care | Payer: 59 | Attending: Student in an Organized Health Care Education/Training Program | Admitting: Student in an Organized Health Care Education/Training Program

## 2023-10-06 ENCOUNTER — Emergency Department: Payer: 59

## 2023-10-06 DIAGNOSIS — R1032 Left lower quadrant pain: Secondary | ICD-10-CM | POA: Diagnosis not present

## 2023-10-06 DIAGNOSIS — Z8616 Personal history of COVID-19: Secondary | ICD-10-CM | POA: Insufficient documentation

## 2023-10-06 DIAGNOSIS — R109 Unspecified abdominal pain: Secondary | ICD-10-CM | POA: Diagnosis present

## 2023-10-06 DIAGNOSIS — Z006 Encounter for examination for normal comparison and control in clinical research program: Secondary | ICD-10-CM | POA: Insufficient documentation

## 2023-10-06 DIAGNOSIS — J45909 Unspecified asthma, uncomplicated: Secondary | ICD-10-CM | POA: Diagnosis not present

## 2023-10-06 DIAGNOSIS — E039 Hypothyroidism, unspecified: Secondary | ICD-10-CM | POA: Diagnosis not present

## 2023-10-06 LAB — BASIC METABOLIC PANEL
Anion gap: 9 (ref 5–15)
BUN: 20 mg/dL (ref 6–20)
CO2: 27 mmol/L (ref 22–32)
Calcium: 9.7 mg/dL (ref 8.9–10.3)
Chloride: 100 mmol/L (ref 98–111)
Creatinine, Ser: 0.8 mg/dL (ref 0.44–1.00)
GFR, Estimated: >60 mL/min (ref >60)
Glucose, Bld: 107 mg/dL — ABNORMAL HIGH (ref 70–99)
Potassium: 4 mmol/L (ref 3.5–5.1)
Sodium: 136 mmol/L (ref 135–145)

## 2023-10-06 LAB — CBC
HCT: 39.2 % (ref 36.0–46.0)
Hemoglobin: 12.8 g/dL (ref 12.0–15.0)
MCH: 26.2 pg (ref 26.0–34.0)
MCHC: 32.7 g/dL (ref 30.0–36.0)
MCV: 80.3 fL (ref 80.0–100.0)
Platelets: 353 10*3/uL (ref 150–400)
RBC: 4.88 MIL/uL (ref 3.87–5.11)
RDW: 14.6 % (ref 11.5–15.5)
WBC: 7.2 10*3/uL (ref 4.0–10.5)
nRBC: 0 % (ref 0.0–0.2)

## 2023-10-06 LAB — URINALYSIS, ROUTINE W REFLEX MICROSCOPIC
Bilirubin Urine: NEGATIVE
Glucose, UA: NEGATIVE mg/dL
Hgb urine dipstick: NEGATIVE
Ketones, ur: NEGATIVE mg/dL
Leukocytes,Ua: NEGATIVE
Nitrite: NEGATIVE
Protein, ur: NEGATIVE mg/dL
Specific Gravity, Urine: 1.015 (ref 1.005–1.030)
pH: 5 (ref 5.0–8.0)

## 2023-10-06 LAB — POC URINE PREG, ED: Preg Test, Ur: NEGATIVE

## 2023-10-06 MED ORDER — IOHEXOL 300 MG/ML  SOLN
100.0000 mL | Freq: Once | INTRAMUSCULAR | Status: AC | PRN
  Administered 2023-10-06: 100 mL via INTRAVENOUS

## 2023-10-06 MED ORDER — ACETAMINOPHEN 325 MG PO TABS
650.0000 mg | ORAL_TABLET | Freq: Once | ORAL | Status: AC
  Administered 2023-10-06: 650 mg via ORAL
  Filled 2023-10-06: qty 2

## 2023-10-06 MED ORDER — KETOROLAC TROMETHAMINE 15 MG/ML IJ SOLN
15.0000 mg | Freq: Once | INTRAMUSCULAR | Status: AC
  Administered 2023-10-06: 15 mg via INTRAVENOUS
  Filled 2023-10-06: qty 1

## 2023-10-06 NOTE — ED Notes (Signed)
Called lab to come and stick pt

## 2023-10-06 NOTE — ED Triage Notes (Signed)
Pt comes via EMS from home with painful urination and flank pain. Pt states this all started two weeks ago. Pt stats radiation to left lower quad.   VSS

## 2023-10-06 NOTE — Discharge Instructions (Signed)
Your blood work, urinalysis, and CT scan are normal.  You did not wish to do any further swabs today.  Please return for any new, worsening, or change in symptoms or other concerns.  It was a pleasure caring for you today.

## 2023-10-06 NOTE — ED Provider Notes (Signed)
[facility] Provider Note    Event Date/Time   First MD Initiated Contact with Patient 10/06/23 1125     (approximate)   History   Flank Pain and Back Pain   HPI  Kathy Vega is a 47 y.o. female with a past medical history of tracheostomy who presents today for evaluation of left-sided flank pain and left-sided abdominal pain that began approximately 2 weeks ago.  Patient denies any burning with urination or hematuria.  She reports that she has not had pain like this before.  She denies history of kidney stones.  She has not had any fevers.  No nausea, vomiting, diarrhea.  She has not taken anything for her symptoms.  She reports that she has not had any vaginal discharge.  She denies being sexually active currently.  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) 09/18/2020   Acute respiratory failure (HCC)    Pneumonia due to COVID-19 virus 08/13/2020   Acute renal failure (ARF) (HCC) 08/13/2020   Elevated liver enzymes 08/13/2020   CAP (community acquired pneumonia) 08/13/2020   Acute encephalopathy 08/13/2020   Hypokalemia 08/13/2020          Physical Exam   Triage Vital Signs: ED Triage Vitals  Encounter Vitals Group     BP 10/06/23 1047 136/85     Systolic BP Percentile --      Diastolic BP Percentile --      Pulse Rate 10/06/23 1047 90     Resp 10/06/23 1047 18     Temp 10/06/23 1047 98.2 F (36.8 C)     Temp Source 10/06/23 1047 Oral     SpO2 10/06/23 1047 97 %     Weight 10/06/23 1043 264 lb (119.7 kg)     Height 10/06/23  1043 5\' 9"  (1.753 m)     Head Circumference --      Peak Flow --      Pain Score 10/06/23 1043 10     Pain Loc --      Pain Education --      Exclude from Growth Chart --     Most recent vital signs: Vitals:   10/06/23 1047 10/06/23 1610  BP: 136/85 126/80  Pulse: 90 79  Resp: 18 18  Temp: 98.2 F (36.8 C)   SpO2: 97% 97%    Physical Exam Vitals and nursing note reviewed.  Constitutional:      General: Awake and alert. No acute distress.    Appearance: Normal appearance. The patient is obese.  HENT:     Head: Normocephalic and atraumatic.     Mouth: Mucous membranes are moist.  Tracheostomy in place Eyes:     General: PERRL. Normal EOMs        Right eye: No discharge.        Left eye: No discharge.     Conjunctiva/sclera: Conjunctivae normal.  Cardiovascular:     Rate and Rhythm: Normal rate and regular rhythm.     Pulses: Normal pulses.  Pulmonary:     Effort: Pulmonary  effort is normal. No respiratory distress.     Breath sounds: Normal breath sounds.  Abdominal:     Abdomen is soft. There is mild left lower quadrant abdominal tenderness. No rebound or guarding. No distention.  No CVA tenderness Musculoskeletal:        General: No swelling. Normal range of motion.     Cervical back: Normal range of motion and neck supple.  Skin:    General: Skin is warm and dry.     Capillary Refill: Capillary refill takes less than 2 seconds.     Findings: No rash.  Neurological:     Mental Status: The patient is awake and alert.      ED Results / Procedures / Treatments   Labs (all labs ordered are listed, but only abnormal results are displayed) Labs Reviewed  URINALYSIS, ROUTINE W REFLEX MICROSCOPIC - Abnormal; Notable for the following components:      Result Value   Color, Urine STRAW (*)    APPearance CLEAR (*)    All other components within normal limits  BASIC METABOLIC PANEL - Abnormal; Notable for the following components:   Glucose, Bld 107 (*)    All  other components within normal limits  WET PREP, GENITAL  CBC  POC URINE PREG, ED     EKG     RADIOLOGY I independently reviewed and interpreted imaging and agree with radiologists findings.     PROCEDURES:  Critical Care performed:   Procedures   MEDICATIONS ORDERED IN ED: Medications  iohexol (OMNIPAQUE) 300 MG/ML solution 100 mL (100 mLs Intravenous Contrast Given 10/06/23 1254)  ketorolac (TORADOL) 15 MG/ML injection 15 mg (15 mg Intravenous Given 10/06/23 1354)  acetaminophen (TYLENOL) tablet 650 mg (650 mg Oral Given 10/06/23 1612)     IMPRESSION / MDM / ASSESSMENT AND PLAN / ED COURSE  I reviewed the triage vital signs and the nursing notes.   Differential diagnosis includes, but is not limited to, nephrolithiasis, pyelonephritis, diverticulitis.  Patient is awake and alert, hemodynamically stable and afebrile.  She is nontoxic in appearance.  She has mild tenderness to palpation to her left lower abdomen without rebound or guarding.  Further workup is indicated.  Labs are obtained which are overall reassuring, no leukocytosis, normal LFTs and lipase, normal kidney function.  Urinalysis does not reveal any evidence of infection.  CT scan obtained demonstrates no acute intraabdominal abnormalities.  Upon reevaluation, patient reports that she continues to have mild discomfort, I offered ultrasound and swabs for further evaluation, the patient initially agreed and then the nurse advised me that the patient changed her mind and wishes to go home.  We did discuss the possibility of missing something without this test, and delayed or advanced diagnosis.  There were no adnexal masses or reproductive organ abnormalities.   We did discuss strict return precautions and the importance of close outpatient follow-up.  Patient understands and agrees with plan.  She was discharged in stable condition.   Patient's presentation is most consistent with acute complicated illness /  injury requiring diagnostic workup.   Clinical Course as of 10/06/23 1731  Fri Oct 06, 2023  1530 Patient continues to report discomfort with urination, despite her clear urinalysis and lack of any findings on her CAT scan.  She is not sexually active, but agrees to wet prep/US [JP]  1607 Patient changed her mind, does not wish to proceed with further testing and would like to go home. [JP]    Clinical Course User  Index [JP] Kinsleigh Ludolph, Herb Grays, PA-C     FINAL CLINICAL IMPRESSION(S) / ED DIAGNOSES   Final diagnoses:  Left flank pain     Rx / DC Orders   ED Discharge Orders     None        Note:  This document was prepared using Dragon voice recognition software and may include unintentional dictation errors.   Keturah Shavers 10/06/23 1731    Willy Eddy, MD 10/06/23 [phone]

## 2023-10-06 NOTE — ED Triage Notes (Signed)
Patient c/o left flank pain and painful urination X2 weeks

## 2023-10-07 ENCOUNTER — Encounter: Payer: Self-pay | Admitting: Internal Medicine

## 2023-10-08 ENCOUNTER — Encounter: Payer: Self-pay | Admitting: Internal Medicine

## 2023-10-09 ENCOUNTER — Ambulatory Visit: Payer: 59

## 2023-10-11 ENCOUNTER — Encounter: Payer: 59 | Admitting: *Deleted

## 2023-10-11 DIAGNOSIS — J4540 Moderate persistent asthma, uncomplicated: Secondary | ICD-10-CM | POA: Diagnosis not present

## 2023-10-11 NOTE — Progress Notes (Signed)
Daily Session Note  Patient Details  Name: Kathy Vega MRN: [id number] Date of Birth: [birth year] Referring Provider:   Flowsheet Row Pulmonary Rehab from 01/26/2023 in [facility] Cardiac and Pulmonary Rehab  Referring Provider Sarina Ser MD       Encounter Date: 10/11/2023  Check In:  Session Check In - 10/11/23 1451       Check-In   Supervising physician immediately available to respond to emergencies See telemetry face sheet for immediately available ER MD    Location [facility]-Cardiac & Pulmonary Rehab    Staff Present Tommye Standard, BS, ACSM CEP, Exercise Physiologist;Megan Katrinka Blazing, RN, ADN;Anicia Leuthold Jewel Baize, RN BSN;Noah Tickle, BS, Exercise Physiologist    Virtual Visit No    Medication changes reported     No    Fall or balance concerns reported    No    Warm-up and Cool-down Performed on first and last piece of equipment    Resistance Training Performed Yes    VAD Patient? No    PAD/SET Patient? No      Pain Assessment   Currently in Pain? No/denies                Social History   Tobacco Use  Smoking Status Former   Current packs/day: 0.00   Average packs/day: 0.5 packs/day for 5.0 years (2.5 ttl pk-yrs)   Types: Cigarettes   Start date: 58   Quit date: 2000   Years since quitting: 24.8  Smokeless Tobacco Never    Goals Met:  Independence with exercise equipment Exercise tolerated well No report of concerns or symptoms today Strength training completed today  Goals Unmet:  Not Applicable  Comments:  Kathy Vega graduated today from  rehab with 36 sessions completed.  Details of the patient's exercise prescription and what She needs to do in order to continue the prescription and progress were discussed with patient.  Patient was given a copy of prescription and goals.  Patient verbalized understanding. Kathy Vega plans to continue to exercise by walking.    Dr. Bethann Punches is Medical Director for [facility] Cardiac Rehabilitation.  Dr. Vida Rigger is  Medical Director for [facility] Pulmonary Rehabilitation.

## 2023-10-11 NOTE — Progress Notes (Signed)
Pulmonary Individual Treatment Plan  Patient Details  Name: Kathy Vega MRN: [id number] Date of Birth: 02/07/1976 Referring Provider:   Flowsheet Row Pulmonary Rehab from 01/26/2023 in [facility] Cardiac and Pulmonary Rehab  Referring Provider Sarina Ser MD       Initial Encounter Date:  Flowsheet Row Pulmonary Rehab from 01/26/2023 in [facility] Cardiac and Pulmonary Rehab  Date 01/26/23       Visit Diagnosis: Moderate persistent asthma in adult without complication  Patient's Home Medications on Admission:  Current Outpatient Medications:    acetaminophen (TYLENOL) 500 MG tablet, Take 500 mg by mouth every 6 (six) hours as needed for headache, fever, moderate pain or mild pain., Disp: , Rfl:    albuterol (PROVENTIL) (2.5 MG/3ML) 0.083% nebulizer solution, Take 3 mLs (2.5 mg total) by nebulization every 6 (six) hours as needed for wheezing or shortness of breath., Disp: 360 mL, Rfl: 11   ascorbic acid (VITAMIN C) 500 MG tablet, Take 1 tablet (500 mg total) by mouth daily., Disp: 30 tablet, Rfl: 0   bisoprolol (ZEBETA) 5 MG tablet, Take 1 tablet (5 mg total) by mouth daily., Disp: 30 tablet, Rfl: 6   budesonide (PULMICORT) 0.5 MG/2ML nebulizer solution, Take 2 mLs (0.5 mg total) by nebulization 2 (two) times daily., Disp: 120 mL, Rfl: 5   formoterol (PERFOROMIST) 20 MCG/2ML nebulizer solution, Take 2 mLs (20 mcg total) by nebulization 2 (two) times daily. Use before the budesonide, twice a day., Disp: 120 mL, Rfl: 11   furosemide (LASIX) 20 MG tablet, Take 1 tablet (20 mg total) by mouth daily as needed for edema (shortness of breath)., Disp: 90 tablet, Rfl: 3   HYDROcodone-acetaminophen (NORCO/VICODIN) 5-325 MG tablet, Take 1 tablet by mouth every 4 (four) hours as needed for moderate pain., Disp: 12 tablet, Rfl: 0   hydrOXYzine (ATARAX) 10 MG tablet, Take 10 mg by mouth 3 (three) times daily., Disp: , Rfl:    iron polysaccharides (NIFEREX) 150 MG capsule, Take 1 capsule (150 mg total) by  mouth daily., Disp: 30 capsule, Rfl: 1   levothyroxine (SYNTHROID) 125 MCG tablet, Take 250 mcg by mouth daily., Disp: , Rfl:    meloxicam (MOBIC) 15 MG tablet, Take 1 tablet (15 mg total) by mouth daily., Disp: 30 tablet, Rfl: 0   methocarbamol (ROBAXIN) 500 MG tablet, Take 1 tablet (500 mg total) by mouth 4 (four) times daily., Disp: 30 tablet, Rfl: 0   norethindrone (AYGESTIN) 5 MG tablet, Take 2 tablets (10 mg total) by mouth daily., Disp: 30 tablet, Rfl: 1   nystatin cream (MYCOSTATIN), Apply topically 2 (two) times daily., Disp: 30 g, Rfl: 0   OXYGEN, Inhale into the lungs at bedtime as needed. Up to 4 liters, Disp: , Rfl:    pantoprazole (PROTONIX) 40 MG tablet, Take 1 tablet (40 mg total) by mouth daily., Disp: 30 tablet, Rfl: 2   propranolol (INDERAL) 10 MG tablet, Take 1 tablet (10 mg total) by mouth 3 (three) times daily as needed (palpitations)., Disp: 90 tablet, Rfl: 6  Past Medical History: Past Medical History:  Diagnosis Date   Acute encephalopathy 08/13/2020   Acute renal failure (ARF) (HCC) 08/13/2020   Anemia    Asthma    Cervical cancer (HCC)    Hypothyroidism    Iron deficiency anemia    Obesity (BMI 30-39.9)    Pneumonia due to COVID-19 virus 08/13/2020   requiring trach and prolonged hospitalization   Subglottic stenosis    Tracheal stenosis    Tracheostomy  tube present (HCC)     Tobacco Use: Social History   Tobacco Use  Smoking Status Former   Current packs/day: 0.00   Average packs/day: 0.5 packs/day for 5.0 years (2.5 ttl pk-yrs)   Types: Cigarettes   Start date: 7   Quit date: 2000   Years since quitting: 24.8  Smokeless Tobacco Never    Labs: Review Flowsheet  More data exists      Latest Ref Rng & Units 08/16/2020 08/17/2020 08/18/2020 08/20/2020 08/31/2020  Labs for Kathy Vega Cardiac and Pulmonary Rehab  Trlycerides <150 mg/dL - 1,610  - - -  PH, Arterial 7.350 - 7.450 7.40  - 7.41  7.38  7.42  7.47   PCO2 arterial 32.0 - 48.0 mmHg 44  - 49  56   48  53   Bicarbonate 20.0 - 28.0 mmol/L 27.3  - 31.1  33.1  31.1  38.6   O2 Saturation % 98.8  - 92.3  94.6  93.1  92.5     Details       Multiple values from one day are sorted in reverse-chronological order          Pulmonary Assessment Scores:   UCSD: Self-administered rating of dyspnea associated with activities of daily living (ADLs) 6-point scale (0 = "not at all" to 5 = "maximal or unable to do because of breathlessness")  Scoring Scores range from 0 to 120.  Minimally important difference is 5 units  CAT: CAT can identify the health impairment of COPD patients and is better correlated with disease progression.  CAT has a scoring range of zero to 40. The CAT score is classified into four groups of low (less than 10), medium (10 - 20), high (21-30) and very high (31-40) based on the impact level of disease on health status. A CAT score over 10 suggests significant symptoms.  A worsening CAT score could be explained by an exacerbation, poor medication adherence, poor inhaler technique, or progression of COPD or comorbid conditions.  CAT MCID is 2 points  mMRC: mMRC (Modified Medical Research Council) Dyspnea Scale is used to assess the degree of baseline functional disability in patients of respiratory disease due to dyspnea. No minimal important difference is established. A decrease in score of 1 point or greater is considered a positive change.   Pulmonary Function Assessment:   Exercise Target Goals: Exercise Program Goal: Individual exercise prescription set using results from initial 6 min walk test and THRR while considering  patient's activity barriers and safety.   Exercise Prescription Goal: Initial exercise prescription builds to 30-45 minutes a day of aerobic activity, 2-3 days per week.  Home exercise guidelines will be given to patient during program as part of exercise prescription that the participant will acknowledge.  Education: Aerobic Exercise: -  Group verbal and visual presentation on the components of exercise prescription. Introduces F.I.T.T principle from ACSM for exercise prescriptions.  Reviews F.I.T.T. principles of aerobic exercise including progression. Written material given at graduation.   Education: Resistance Exercise: - Group verbal and visual presentation on the components of exercise prescription. Introduces F.I.T.T principle from ACSM for exercise prescriptions  Reviews F.I.T.T. principles of resistance exercise including progression. Written material given at graduation.    Education: Exercise & Equipment Safety: - Individual verbal instruction and demonstration of equipment use and safety with use of the equipment. Flowsheet Row Pulmonary Rehab from 04/12/2023 in [facility] Cardiac and Pulmonary Rehab  Date 01/16/23  Educator [facility]  Instruction Review Code 1- Verbalizes Understanding  Education: Exercise Physiology & General Exercise Guidelines: - Group verbal and written instruction with models to review the exercise physiology of the cardiovascular system and associated critical values. Provides general exercise guidelines with specific guidelines to those with heart or lung disease.    Education: Flexibility, Balance, Mind/Body Relaxation: - Group verbal and visual presentation with interactive activity on the components of exercise prescription. Introduces F.I.T.T principle from ACSM for exercise prescriptions. Reviews F.I.T.T. principles of flexibility and balance exercise training including progression. Also discusses the mind body connection.  Reviews various relaxation techniques to help reduce and manage stress (i.e. Deep breathing, progressive muscle relaxation, and visualization). Balance handout provided to take home. Written material given at graduation.   Activity Barriers & Risk Stratification:   6 Minute Walk:  6 Minute Walk     Row Name 09/13/23 1406         6 Minute Walk   Phase Discharge      Distance 1375 feet     Distance % Change 248 %     Distance Feet Change 980 ft     Walk Time 6 minutes     # of Rest Breaks 0     MPH 2.6     METS 3.91     RPE 13     Perceived Dyspnea  1     VO2 Peak 13.7     Symptoms No     Resting HR 89 bpm     Resting BP 110/62     Resting Oxygen Saturation  96 %     Exercise Oxygen Saturation  during 6 min walk 91 %     Max Ex. HR 126 bpm     Max Ex. BP 138/72     2 Minute Post BP 126/70       Interval HR   1 Minute HR 110     2 Minute HR 118     3 Minute HR 120     4 Minute HR 124     5 Minute HR 125     6 Minute HR 126     2 Minute Post HR 99     Interval Heart Rate? Yes       Interval Oxygen   Interval Oxygen? Yes     Baseline Oxygen Saturation % 96 %     1 Minute Oxygen Saturation % 97 %     1 Minute Liters of Oxygen 0 L  RA     2 Minute Oxygen Saturation % 96 %     2 Minute Liters of Oxygen 0 L     3 Minute Oxygen Saturation % 95 %     3 Minute Liters of Oxygen 0 L     4 Minute Oxygen Saturation % 94 %     4 Minute Liters of Oxygen 0 L     5 Minute Oxygen Saturation % 91 %     5 Minute Liters of Oxygen 0 L     6 Minute Oxygen Saturation % 93 %     6 Minute Liters of Oxygen 0 L     2 Minute Post Oxygen Saturation % 98 %     2 Minute Post Liters of Oxygen 0 L             Oxygen Initial Assessment:   Oxygen Re-Evaluation:  Oxygen Re-Evaluation     Row Name 05/03/23 1435 05/29/23 1412 09/11/23 1420 10/11/23 1400  Program Oxygen Prescription   Program Oxygen Prescription None None None None    Liters per minute -- -- 0 0    Comments now staying on room air and saturation are staying above 90% -- -- now staying on room air and saturation are staying above 90%      Home Oxygen   Home Oxygen Device None;Home Concentrator  not needing currently None;Home Concentrator None;Home Concentrator None;Home Concentrator    Sleep Oxygen Prescription None None None None    Liters per minute -- -- 2  as needed 2     Home Exercise Oxygen Prescription None None None None    Liters per minute -- -- -- 4    Home Resting Oxygen Prescription None None None None    Liters per minute -- -- 2  as needed 2    Compliance with Home Oxygen Use Yes -- Yes Yes      Goals/Expected Outcomes   Short Term Goals To learn and exhibit compliance with exercise, home and travel O2 prescription;To learn and understand importance of monitoring SPO2 with pulse oximeter and demonstrate accurate use of the pulse oximeter.;To learn and understand importance of maintaining oxygen saturations>88%;To learn and demonstrate proper pursed lip breathing techniques or other breathing techniques.  To learn and understand importance of monitoring SPO2 with pulse oximeter and demonstrate accurate use of the pulse oximeter.;To learn and understand importance of maintaining oxygen saturations>88% Other;To learn and understand importance of maintaining oxygen saturations>88%;To learn and understand importance of monitoring SPO2 with pulse oximeter and demonstrate accurate use of the pulse oximeter. Other;To learn and understand importance of maintaining oxygen saturations>88%;To learn and understand importance of monitoring SPO2 with pulse oximeter and demonstrate accurate use of the pulse oximeter.    Long  Term Goals Exhibits compliance with exercise, home  and travel O2 prescription;Verbalizes importance of monitoring SPO2 with pulse oximeter and return demonstration;Maintenance of O2 saturations>88%;Compliance with respiratory medication;Exhibits proper breathing techniques, such as pursed lip breathing or other method taught during program session Verbalizes importance of monitoring SPO2 with pulse oximeter and return demonstration;Maintenance of O2 saturations>88% Maintenance of O2 saturations>88%;Verbalizes importance of monitoring SPO2 with pulse oximeter and return demonstration;Other Maintenance of O2 saturations>88%;Verbalizes importance of monitoring  SPO2 with pulse oximeter and return demonstration;Other    Comments Kathy Vega is doing well in rehab.  She is no longer requiring oxygen therapy to maintain her saturations.  She continues to check them and uses her nebulizer routinely at home.  She is good about using her PLB as well.  She wants to ask about getting her trach out and plans to ask her doctor about it at end of month.  She is worried about closing off if it comes out, but really doesn't want it the rest of her life. She/has a pulse oximeter to check her oxygen saturation at home. Informed and explained why it is important to have one. Reviewed that oxygen saturations should be 88 percent and above. Patient verbalizes understanding. Kathy Vega is doing well in Rehab. Her oxygen has been staying above 88 percent on exertion. She uses oxygen at night as needed. Julliana is graduating from rehab today. She has her trach removal surgery scheduled for January and continues to be followed by her medical team to help control SOB. SHe takes all her respiratory meds as prescribed and monitors oxygen saturatings and SOB.    Goals/Expected Outcomes Short: Continue to monitor saturations Long: Talk to doctor about trach removal. Short: monitor oxygen at home with  exertion. Long: maintain oxygen saturations above 88 percent independently. Short: continue to monitor oxygen while exercising. Long: maintain oxygen saturations independently. Short: graduate from pulmonary rehab. Long: continue to manage oxygen saturations  and pulmonary health.             Oxygen Discharge (Final Oxygen Re-Evaluation):  Oxygen Re-Evaluation - 10/11/23 1400       Program Oxygen Prescription   Program Oxygen Prescription None    Liters per minute 0    Comments now staying on room air and saturation are staying above 90%      Home Oxygen   Home Oxygen Device None;Home Concentrator    Sleep Oxygen Prescription None    Liters per minute 2    Home Exercise Oxygen Prescription  None    Liters per minute 4    Home Resting Oxygen Prescription None    Liters per minute 2    Compliance with Home Oxygen Use Yes      Goals/Expected Outcomes   Short Term Goals Other;To learn and understand importance of maintaining oxygen saturations>88%;To learn and understand importance of monitoring SPO2 with pulse oximeter and demonstrate accurate use of the pulse oximeter.    Long  Term Goals Maintenance of O2 saturations>88%;Verbalizes importance of monitoring SPO2 with pulse oximeter and return demonstration;Other    Comments Desere is graduating from rehab today. She has her trach removal surgery scheduled for January and continues to be followed by her medical team to help control SOB. SHe takes all her respiratory meds as prescribed and monitors oxygen saturatings and SOB.    Goals/Expected Outcomes Short: graduate from pulmonary rehab. Long: continue to manage oxygen saturations  and pulmonary health.             Initial Exercise Prescription:   Perform Capillary Blood Glucose checks as needed.  Exercise Prescription Changes:   Exercise Prescription Changes     Row Name 04/27/23 1500 05/10/23 0900 05/25/23 1400 06/10/23 1100 07/04/23 1500     Response to Exercise   Blood Pressure (Admit) 110/72 134/62 132/80 128/80 146/90   Blood Pressure (Exercise) 144/76 148/80 -- -- --   Blood Pressure (Exit) 104/62 122/62 136/78 122/78 146/98   Heart Rate (Admit) 95 bpm 95 bpm 90 bpm 92 bpm 93 bpm   Heart Rate (Exercise) 128 bpm 124 bpm 118 bpm 115 bpm 122 bpm   Heart Rate (Exit) 105 bpm 102 bpm 102 bpm 106 bpm 124 bpm   Oxygen Saturation (Admit) 94 % 93 % 96 % 97 % 99 %   Oxygen Saturation (Exercise) 89 % 92 % 92 % 95 % 91 %   Oxygen Saturation (Exit) 96 % 94 % 95 % 95 % 94 %   Rating of Perceived Exertion (Exercise) 16 13 13 13 13    Perceived Dyspnea (Exercise) 3 2 2 2 2    Symptoms SOB SOB SOB SOB SOB   Duration Continue with 30 min of aerobic exercise without  signs/symptoms of physical distress. Continue with 30 min of aerobic exercise without signs/symptoms of physical distress. Continue with 30 min of aerobic exercise without signs/symptoms of physical distress. Continue with 30 min of aerobic exercise without signs/symptoms of physical distress. Continue with 30 min of aerobic exercise without signs/symptoms of physical distress.   Intensity THRR unchanged THRR unchanged THRR unchanged THRR unchanged THRR unchanged     Progression   Progression Continue to progress workloads to maintain intensity without signs/symptoms of physical distress. Continue to progress workloads to maintain intensity  without signs/symptoms of physical distress. Continue to progress workloads to maintain intensity without signs/symptoms of physical distress. Continue to progress workloads to maintain intensity without signs/symptoms of physical distress. Continue to progress workloads to maintain intensity without signs/symptoms of physical distress.   Average METs 2.03 3.26 2.24 2.35 2.09     Resistance Training   Training Prescription Yes Yes Yes Yes Yes   Weight 3 lb 3 lb 3 lb 3 lb 4 lb   Reps 10-15 10-15 10-15 10-15 10-15     Interval Training   Interval Training No No No No No     Oxygen   Liters RA RA RA RA RA     NuStep   Level 1 5 -- 3 --   Minutes 15 30 -- 15 --   METs 2.3 4.6 -- 2.6 --     Arm Ergometer   Level 1 -- -- 1 1.5   Minutes 15 -- -- 15 15   METs 2.1 -- -- 2.6 1     REL-XR   Level 4 5 5  -- --   Minutes 15 15 15  -- --   METs 1 3.6 3 -- --     T5 Nustep   Level -- -- 2 -- --   Minutes -- -- 30 -- --   METs -- -- 2.2 -- --     Biostep-RELP   Level 1 -- -- -- 3   Minutes 15 -- -- -- 15   METs 3 -- -- -- 3     Track   Laps 18 8 18 20 28    Minutes 15 15 15 15 15    METs 1.98 1.44 1.98 2.09 2.52     Oxygen   Maintain Oxygen Saturation 88% or higher 88% or higher 88% or higher 88% or higher 88% or higher    Row Name 07/17/23 0900  07/19/23 1400 08/01/23 1400 09/01/23 1200 09/15/23 1100     Response to Exercise   Blood Pressure (Admit) 122/60 -- 106/60 102/62 120/62   Blood Pressure (Exit) 120/98 -- 116/60 100/62 118/80   Heart Rate (Admit) 80 bpm -- 111 bpm 98 bpm 87 bpm   Heart Rate (Exercise) 118 bpm -- 130 bpm 121 bpm 113 bpm   Heart Rate (Exit) 105 bpm -- 105 bpm 110 bpm 80 bpm   Oxygen Saturation (Admit) 96 % -- 95 % 98 % 97 %   Oxygen Saturation (Exercise) 93 % -- 94 % 92 % 94 %   Oxygen Saturation (Exit) 93 % -- 98 % 94 % 97 %   Rating of Perceived Exertion (Exercise) 13 -- 13 13 13    Perceived Dyspnea (Exercise) 1 -- 2 1 1    Symptoms SOB -- SOB SOB SOB   Duration Continue with 30 min of aerobic exercise without signs/symptoms of physical distress. -- Continue with 30 min of aerobic exercise without signs/symptoms of physical distress. Continue with 30 min of aerobic exercise without signs/symptoms of physical distress. Continue with 30 min of aerobic exercise without signs/symptoms of physical distress.   Intensity THRR unchanged -- THRR unchanged THRR unchanged THRR unchanged     Progression   Progression Continue to progress workloads to maintain intensity without signs/symptoms of physical distress. -- Continue to progress workloads to maintain intensity without signs/symptoms of physical distress. Continue to progress workloads to maintain intensity without signs/symptoms of physical distress. Continue to progress workloads to maintain intensity without signs/symptoms of physical distress.   Average METs 3.55 -- 2.17  2.09 2.43     Resistance Training   Training Prescription Yes -- Yes Yes Yes   Weight 4 lb -- 4 lb 4 lb 4 lb   Reps 10-15 -- 10-15 10-15 10-15     Interval Training   Interval Training No -- No No No     Oxygen   Liters RA -- -- RA RA     NuStep   Level -- -- 5 -- --   Minutes -- -- 15 -- --   METs -- -- 3.8 -- --     Arm Ergometer   Level 1.5 -- 4 -- 1.3   Minutes 15 -- 15 -- 15    METs 2.4 -- 1 -- 2.6     REL-XR   Level 6 -- 5 6 --   Minutes 15 -- 15 15 --   METs 5.8 -- -- -- --     Biostep-RELP   Level 3 -- -- -- --   Minutes 15 -- -- -- --   METs 3 -- -- -- --     Track   Laps 42 -- 20 20 23    Minutes 15 -- 15 15 15    METs 3.28 -- 2.09 2.09 2.25     Home Exercise Plan   Plans to continue exercise at -- Home (comment)  Walking and stretching at home in neighborhood, potentially joining [facility] Home (comment)  Walking and stretching at home in neighborhood, potentially joining [facility] Home (comment)  Walking and stretching at home in neighborhood, potentially joining [facility] Home (comment)  Walking and stretching at home in neighborhood, potentially joining [facility]   Frequency -- Add 2 additional days to program exercise sessions. Add 2 additional days to program exercise sessions. Add 2 additional days to program exercise sessions. Add 2 additional days to program exercise sessions.   Initial Home Exercises Provided -- 07/19/23 07/19/23 07/19/23 07/19/23     Oxygen   Maintain Oxygen Saturation 88% or higher 88% or higher 88% or higher 88% or higher 88% or higher    Row Name 09/28/23 1600             Response to Exercise   Blood Pressure (Admit) 118/62       Blood Pressure (Exit) 122/64       Heart Rate (Admit) 90 bpm       Heart Rate (Exercise) 126 bpm       Heart Rate (Exit) 104 bpm       Oxygen Saturation (Admit) 97 %       Oxygen Saturation (Exercise) 93 %       Oxygen Saturation (Exit) 95 %       Rating of Perceived Exertion (Exercise) 13       Perceived Dyspnea (Exercise) 1       Symptoms SOB       Duration Continue with 30 min of aerobic exercise without signs/symptoms of physical distress.       Intensity THRR unchanged         Progression   Progression Continue to progress workloads to maintain intensity without signs/symptoms of physical distress.       Average METs 2.19         Resistance Training   Training Prescription Yes        Weight 5 lb       Reps 10-15         Interval Training   Interval Training No  Oxygen   Liters RA         Arm Ergometer   Level 1.5       Minutes 15       METs 1         REL-XR   Level 6       Minutes 15         Biostep-RELP   Level 3       Minutes 15       METs 3         Track   Laps 37       Minutes 15       METs 3.01         Home Exercise Plan   Plans to continue exercise at Home (comment)  Walking and stretching at home in neighborhood, potentially joining [facility]       Frequency Add 2 additional days to program exercise sessions.       Initial Home Exercises Provided 07/19/23         Oxygen   Maintain Oxygen Saturation 88% or higher                Exercise Comments:   Exercise Goals and Review:   Exercise Goals Re-Evaluation :  Exercise Goals Re-Evaluation     Row Name 04/27/23 1541 05/03/23 1427 05/10/23 0931 05/25/23 1417 06/10/23 1122     Exercise Goal Re-Evaluation   Exercise Goals Review Increase Physical Activity;Increase Strength and Stamina;Understanding of Exercise Prescription Increase Physical Activity;Increase Strength and Stamina;Understanding of Exercise Prescription Increase Physical Activity;Increase Strength and Stamina;Understanding of Exercise Prescription Increase Physical Activity;Increase Strength and Stamina;Understanding of Exercise Prescription Increase Physical Activity;Increase Strength and Stamina;Understanding of Exercise Prescription   Comments Lareina is doing well in rehab. She recently increased her overall average MET level to 2.03 METs. She also has continued to do well with exercise without oxygen, and her O2 saturation has only dropped below 90% once since the last review. She walked up to 18 laps as well, and improved to level 4 on the XR. We will continue to monitor her progress in the program. Kathy Vega is doing well in rehab. She is exercising some on her off days from rehab by walking.  She is doing well  with it; her breathing is her biggest limitation, but it is doing well.  She has noticed that her stamina is getting better with being in rehab. Kathy Vega continues to do well in rehab. She was able to improve to level 5 on both the T4 nustep and the XR. She also has continued to do well walking on the track without oxygen, as her O2 saturations have not dropped below 92%. She also increased her overall average MET level to 3.26 METs. We will continue to monitor her progress in the program. Kathy Vega is doing well in rehab. She continues to work at level 5 on the XR and level 2 on the T5 nustep. She also increased her laps on the track back up to 18 laps. We will continue to monitor her progress in the program. Breyonna continues to work on increasein workloads and keeping her RPE and THRR within her parameters. She increased the T5 Nustep from level 2 to level 3 this revciew period. Increased her laps by 2. REmains using 3 lb hand weights. Encourage her to increase the T4 NUstep by 1-2 levels.  Will continue to monitor exercise progression with goal of increased stamina and strength.   Expected Outcomes Short: Continue to  progressively increase workloads. Long: Continue to increase overall MET level and stamina. Short: Continue to walk on off days Long: Continue to improve stamina Short: Continue to push for more laps on the track. Long: Continue to improve strength and stamina. Short: Continue to push for more laps on the track. Long: Continue to improve strength and stamina. KGM:[id number] workloads as tolerated with goal of increased stamina and strength. Continue to encourage slow progression.   LTG: Continued exercise progression as tolerated during program and after discharge.    Row Name 07/04/23 1517 07/17/23 0921 07/19/23 1446 08/01/23 1436 08/15/23 1421     Exercise Goal Re-Evaluation   Exercise Goals Review Increase Physical Activity;Increase Strength and Stamina;Understanding of Exercise Prescription Increase  Physical Activity;Increase Strength and Stamina;Understanding of Exercise Prescription Increase Physical Activity;Able to understand and use Dyspnea scale;Understanding of Exercise Prescription;Knowledge and understanding of Target Heart Rate Range (THRR);Increase Strength and Stamina;Able to check pulse independently;Able to understand and use rate of perceived exertion (RPE) scale Increase Physical Activity;Increase Strength and Stamina;Understanding of Exercise Prescription Increase Physical Activity;Increase Strength and Stamina;Understanding of Exercise Prescription   Comments Jazlyn continues to do well in rehab. She walked up to 28 laps on the track and increased her biostep level to 3. She also increased from 3 lbs to 4 lbs for resistance training. We will continue to monitor her progress in the program. Kathy Vega continues to do well in rehab. She increased her track laps from 28 to 42, she also increased her level on the REXR from level 5 to 6. Improvements were also seen in percieved Dyspnea, and her overall average MET's increased from 2.09 last review to 3.55. We will continue to monitor her progress in the program. Reviewed home exercise with pt today.  Pt plans to walk at home around her neighborhood and stretch for exercise.  She also may join the [facility].  Reviewed THR, pulse, RPE, sign and symptoms, pulse oximetery and when to call 911 or MD.  Also discussed weather considerations and indoor options.  Pt voiced understanding. Kathy Vega continues to do well in rehab. She recently increased her intensity on the upper body ergometer from 1.5 to 4. We will continue to monitor her progress in the program. Kathy Vega did not attend a session this review. We have been in contact with her, and she is dealing with some personal matters. We will continue to monitor her progress in the program upon return.   Expected Outcomes Short: Continue to push for more laps on the track. Long: Continue exercise to improve strength  and stamina. Short: Continue to follow current exercise prescription, and progressively increase workloads. Long: Continue exercise to improve strength and stamina. Short: Walk at home on days away from rehab. Long: Continue to exercise independently. Short: Continue to follow current exercise prescription, and progressively increase workloads. Long: Continue exercise to improve strength and stamina. Short: Return to rehab. Long: Continue exercise to improve strength and stamina.    Row Name 09/01/23 1218 09/15/23 1116 09/28/23 1606 10/11/23 1350       Exercise Goal Re-Evaluation   Exercise Goals Review Increase Physical Activity;Increase Strength and Stamina;Understanding of Exercise Prescription Increase Physical Activity;Increase Strength and Stamina;Understanding of Exercise Prescription Increase Physical Activity;Increase Strength and Stamina;Understanding of Exercise Prescription Increase Physical Activity;Increase Strength and Stamina;Able to understand and use rate of perceived exertion (RPE) scale;Able to understand and use Dyspnea scale;Knowledge and understanding of Target Heart Rate Range (THRR);Able to check pulse independently;Understanding of Exercise Prescription    Comments Kathy Vega has  only attended one session since the last review. During that session she was able to walk 20 laps on the track and improve back up to level 6 on the XR. She is due for her post when she returns and will look to improve on it. We will continue to monitor her progress in the program. Kathy Vega has only attended one session since the last review. During that session she was able to walk 23 laps on the track and work at level 1.3 on the arm crank. She also completed her post and improved by 248%! We will continue to monitor her progress in the program. Kathy Vega continues to do well in rehab and is close to graduating. She recently completed her post and improved by 248%! She also walked up to 37 laps on the  track and stayed consistent with her workloads on the XR and biostep. We will continue to monitor her progress until she graduates from the program. Kathy Vega graduates from pulmonary rehab today and plans to continue her exercise at home by walking, using hand weights, and stretching. She is feeling stronger but SOB is still her main limiting factors.    Expected Outcomes Short: Attend rehab consistently, improve on post . Long: Continue exercise to improve strength and stamina. Short: Graduate. Long: Continue to exercise indpendently. Short: Graduate. Long: Continue to exercise indpendently. Short: Graduate. Long: Continue to exercise indpendently.             Discharge Exercise Prescription (Final Exercise Prescription Changes):  Exercise Prescription Changes - 09/28/23 1600       Response to Exercise   Blood Pressure (Admit) 118/62    Blood Pressure (Exit) 122/64    Heart Rate (Admit) 90 bpm    Heart Rate (Exercise) 126 bpm    Heart Rate (Exit) 104 bpm    Oxygen Saturation (Admit) 97 %    Oxygen Saturation (Exercise) 93 %    Oxygen Saturation (Exit) 95 %    Rating of Perceived Exertion (Exercise) 13    Perceived Dyspnea (Exercise) 1    Symptoms SOB    Duration Continue with 30 min of aerobic exercise without signs/symptoms of physical distress.    Intensity THRR unchanged      Progression   Progression Continue to progress workloads to maintain intensity without signs/symptoms of physical distress.    Average METs 2.19      Resistance Training   Training Prescription Yes    Weight 5 lb    Reps 10-15      Interval Training   Interval Training No      Oxygen   Liters RA      Arm Ergometer   Level 1.5    Minutes 15    METs 1      REL-XR   Level 6    Minutes 15      Biostep-RELP   Level 3    Minutes 15    METs 3      Track   Laps 37    Minutes 15    METs 3.01      Home Exercise Plan   Plans to continue exercise at Home (comment)   Walking and stretching  at home in neighborhood, potentially joining [facility]   Frequency Add 2 additional days to program exercise sessions.    Initial Home Exercises Provided 07/19/23      Oxygen   Maintain Oxygen Saturation 88% or higher  Nutrition:  Target Goals: Understanding of nutrition guidelines, daily intake of sodium 1500mg , cholesterol 200mg , calories 30% from fat and 7% or less from saturated fats, daily to have 5 or more servings of fruits and vegetables.  Education: All About Nutrition: -Group instruction provided by verbal, written material, interactive activities, discussions, models, and posters to present general guidelines for heart healthy nutrition including fat, fiber, MyPlate, the role of sodium in heart healthy nutrition, utilization of the nutrition label, and utilization of this knowledge for meal planning. Follow up email sent as well. Written material given at graduation.   Biometrics:   Post Biometrics - 09/13/23 1412        Post  Biometrics   Height 5\' 9"  (1.753 m)    Weight 265 lb 9.6 oz (120.5 kg)    Waist Circumference 47 inches    Hip Circumference 46.5 inches    Waist to Hip Ratio 1.01 %    BMI (Calculated) 39.2    Single Leg Stand 30 seconds             Nutrition Therapy Plan and Nutrition Goals:  Nutrition Therapy & Goals - 06/21/23 1554       Nutrition Therapy   Diet Mediterranean    Protein (specify units) 90    Fiber 25 grams    Whole Grain Foods 3 servings    Saturated Fats 15 max. grams    Fruits and Vegetables 5 servings/day    Sodium 2 grams      Personal Nutrition Goals   Nutrition Goal Eat 3 times per day (carbs paired with protein)    Personal Goal #2 Limit fried foods    Comments She is drinking mostly vitamin water, some soda. Talked to her about ensuring she keep soda to a limit and meet a goal of ~64oz of water daily. She doesn't feel hungry most of the day. Will miss breakfast almost every day, dinner most days. Lunch  is not always big in size but can at times be high calorie foods like pizza, fried chicken, ect. Educated on the Mediterranean diet and ways to eat more healthy foods, reduce bad fats, refined sugars, and sodium. But main focus is on getting enough nutrition with her small appetite. Build out several small meals and snacks centered around nutrient and calorie dense foods to help her meet her nutrition goals with as few bites as needed.      Intervention Plan   Intervention Nutrition handout(s) given to patient.;Prescribe, educate and counsel regarding individualized specific dietary modifications aiming towards targeted core components such as weight, hypertension, lipid management, diabetes, heart failure and other comorbidities.    Expected Outcomes Long Term Goal: Adherence to prescribed nutrition plan.;Short Term Goal: A plan has been developed with personal nutrition goals set during dietitian appointment.;Short Term Goal: Understand basic principles of dietary content, such as calories, fat, sodium, cholesterol and nutrients.             Nutrition Assessments:  MEDIFICTS Score Key: >=70 Need to make dietary changes  40-70 Heart Healthy Diet <= 40 Therapeutic Level Cholesterol Diet  Flowsheet Row Pulmonary Rehab from 01/26/2023 in [facility]  Picture Your Plate Total Score on Admission 47      Picture Your Plate Scores: <75 Unhealthy dietary pattern with much room for improvement. 41-50 Dietary pattern unlikely to meet recommendations for good health and room for improvement. 51-60 More healthful dietary pattern, with some room for improvement.  >60 Healthy dietary pattern,  although there may be some specific behaviors that could be improved.   Nutrition Goals Re-Evaluation:  Nutrition Goals Re-Evaluation     Row Name 05/03/23 1431 05/29/23 1407 06/19/23 1407 09/11/23 1429 10/11/23 1348     Goals   Current Weight -- 264 lb (119.7 kg) 266 lb (120.7 kg) 268  lb (121.6 kg) --   Nutrition Goal work on snacking Meet with RD Meet with RD Lose weight, eat smaller meals Lose weight, eat smaller meals   Comment Kathy Vega declined dietitian appt. Her weight is going down.  She is eating more fruit and cut back on portion sizes.  She is trying to do better with eating smaller meals. Kathy Vega would like to meet with RD Patient is talking to RD to set up for an appointment. Kathy Vega is trying to eat smaller meals and lose some weight Kathy Vega is graduating from pulmonary rehab today and plans to continue to work towards her weight loss goal. Her goal is to work towards losing 50 lbs. She is eating smaller meals and being mindful of making sure she meets her nutritional needs.   Expected Outcome Short; Continue to add in more fruits Long: Continue to improve diet Short: Meet with RD. Long: adhere to a diet plan that pertains to her. Short: meet with RD and lose weight. Long: lose weight and follow RD guidlines. Short: eat smaller meals. Long: continue to work [employer]. Short: graduate from pulmonary rehab. Long: continue to work towards weight loss goal of 50 lbs.            Nutrition Goals Discharge (Final Nutrition Goals Re-Evaluation):  Nutrition Goals Re-Evaluation - 10/11/23 1348       Goals   Nutrition Goal Lose weight, eat smaller meals    Comment Kathy Vega is graduating from pulmonary rehab today and plans to continue to work towards her weight loss goal. Her goal is to work towards losing 50 lbs. She is eating smaller meals and being mindful of making sure she meets her nutritional needs.    Expected Outcome Short: graduate from pulmonary rehab. Long: continue to work towards weight loss goal of 50 lbs.             Psychosocial: Target Goals: Acknowledge presence or absence of significant depression and/or stress, maximize coping skills, provide positive support system. Participant is able to verbalize types and ability to use techniques and skills  needed for reducing stress and depression.   Education: Stress, Anxiety, and Depression - Group verbal and visual presentation to define topics covered.  Reviews how body is impacted by stress, anxiety, and depression.  Also discusses healthy ways to reduce stress and to treat/manage anxiety and depression.  Written material given at graduation.   Education: Sleep Hygiene -Provides group verbal and written instruction about how sleep can affect your health.  Define sleep hygiene, discuss sleep cycles and impact of sleep habits. Review good sleep hygiene tips.    Initial Review & Psychosocial Screening:   Quality of Life Scores:  Scores of 19 and below usually indicate a poorer quality of life in these areas.  A difference of  2-3 points is a clinically meaningful difference.  A difference of 2-3 points in the total score of the Quality of Life Index has been associated with significant improvement in overall quality of life, self-image, physical symptoms, and general health in studies assessing change in quality of life.  PHQ-9: Review Flowsheet       05/29/2023 04/10/2023 01/26/2023  Depression screen PHQ 2/9  Decreased Interest 2 2 3   Down, Depressed, Hopeless 0 0 1  PHQ - 2 Score 2 2 4   Altered sleeping 0 0 0  Tired, decreased energy 1 3 3   Change in appetite 1 0 0  Feeling bad or failure about yourself  0 0 0  Trouble concentrating 0 0 0  Moving slowly or fidgety/restless 0 0 0  Suicidal thoughts 0 0 0  PHQ-9 Score 4 5 7   Difficult doing work/chores Somewhat difficult Somewhat difficult Somewhat difficult    Details           Interpretation of Total Score  Total Score Depression Severity:  1-4 = Minimal depression, 5-9 = Mild depression, 10-14 = Moderate depression, 15-19 = Moderately severe depression, 20-27 = Severe depression   Psychosocial Evaluation and Intervention:   Psychosocial Re-Evaluation:  Psychosocial Re-Evaluation     Row Name 05/03/23 1429 05/29/23  1417 06/19/23 1409 09/11/23 1432 10/11/23 1358     Psychosocial Re-Evaluation   Current issues with History of Depression History of Depression None Identified None Identified None Identified   Comments Kathy Vega is doing well in rehab. She is feeling better mentally and in a better place with her mood since when she was first released.  She is sleeping well. Reviewed patient health questionnaire (PHQ-9) with patient for follow up. Previously, patients score indicated signs/symptoms of depression.  Reviewed to see if patient is improving symptom wise while in program.  Score improved and patient states that it is because she has been able to have more energy. Patient reports no issues with their current mental states, sleep, stress, depression or anxiety. Will follow up with patient in a few weeks for any changes. Patient reports no issues with their current mental states, sleep, stress, depression or anxiety. Will follow up with patient in a few weeks for any changes. Marvia graduates from pulmonary rehab today. She reports no issues with their current mental states, sleep, stress, depression or anxiety. She will continue to monitor her mental health and inform her doctor of any changes or concerns.   Expected Outcomes Short: Conitnue to exercise for mental boost LOng; Continue to stay positive Short: Continue to attend [facility] regularly for regular exercise and social engagement. Long: Continue to improve symptoms and manage a positive mental state. Short: Continue to exercise regularly to support mental health and notify staff of any changes. Long: maintain mental health and well being through teaching of rehab or prescribed medications independently. Short: Continue to exercise regularly to support mental health and notify staff of any changes. Long: maintain mental health and well being through teaching of rehab or prescribed medications independently. Short: graduate from pulmonary rehab. Continue  to exercise regularly to support mental health. Long: maintain mental health and well being   Interventions Encouraged to attend Pulmonary Rehabilitation for the exercise Encouraged to attend Pulmonary Rehabilitation for the exercise Encouraged to attend Pulmonary Rehabilitation for the exercise Encouraged to attend Pulmonary Rehabilitation for the exercise --   Continue Psychosocial Services  Follow up required by staff Follow up required by staff Follow up required by staff Follow up required by staff No Follow up required  graduating.            Psychosocial Discharge (Final Psychosocial Re-Evaluation):  Psychosocial Re-Evaluation - 10/11/23 1358       Psychosocial Re-Evaluation   Current issues with None Identified    Comments Kathy Vega graduates from pulmonary rehab today. She reports no issues with  their current mental states, sleep, stress, depression or anxiety. She will continue to monitor her mental health and inform her doctor of any changes or concerns.    Expected Outcomes Short: graduate from pulmonary rehab. Continue to exercise regularly to support mental health. Long: maintain mental health and well being    Continue Psychosocial Services  No Follow up required   graduating.            Education: Education Goals: Education classes will be provided on a weekly basis, covering required topics. Participant will state understanding/return demonstration of topics presented.  Learning Barriers/Preferences:   General Pulmonary Education Topics:  Infection Prevention: - Provides verbal and written material to individual with discussion of infection control including proper hand washing and proper equipment cleaning during exercise session. Flowsheet Row Pulmonary Rehab from 04/12/2023 in [facility] Cardiac and Pulmonary Rehab  Date 01/16/23  Educator [facility]  Instruction Review Code 1- Verbalizes Understanding       Falls Prevention: - Provides verbal and written material to  individual with discussion of falls prevention and safety. Flowsheet Row Pulmonary Rehab from 04/12/2023 in [facility] Cardiac and Pulmonary Rehab  Date 01/16/23  Educator [facility]  Instruction Review Code 1- Verbalizes Understanding       Chronic Lung Disease Review: - Group verbal instruction with posters, models, PowerPoint presentations and videos,  to review new updates, new respiratory medications, new advancements in procedures and treatments. Providing information on websites and "800" numbers for continued self-education. Includes information about supplement oxygen, available portable oxygen systems, continuous and intermittent flow rates, oxygen safety, concentrators, and Medicare reimbursement for oxygen. Explanation of Pulmonary Drugs, including class, frequency, complications, importance of spacers, rinsing mouth after steroid MDI's, and proper cleaning methods for nebulizers. Review of basic lung anatomy and physiology related to function, structure, and complications of lung disease. Review of risk factors. Discussion about methods for diagnosing sleep apnea and types of masks and machines for OSA. Includes a review of the use of types of environmental controls: home humidity, furnaces, filters, dust mite/pet prevention, HEPA vacuums. Discussion about weather changes, air quality and the benefits of nasal washing. Instruction on Warning signs, infection symptoms, calling MD promptly, preventive modes, and value of vaccinations. Review of effective airway clearance, coughing and/or vibration techniques. Emphasizing that all should Create an Action Plan. Written material given at graduation. Flowsheet Row Pulmonary Rehab from 04/12/2023 in [facility] Cardiac and Pulmonary Rehab  Education need identified 01/26/23       AED/CPR: - Group verbal and written instruction with the use of models to demonstrate the basic use of the AED with the basic ABC's of resuscitation.    Anatomy and Cardiac  Procedures: - Group verbal and visual presentation and models provide information about basic cardiac anatomy and function. Reviews the testing methods done to diagnose heart disease and the outcomes of the test results. Describes the treatment choices: Medical Management, Angioplasty, or Coronary Bypass Surgery for treating various heart conditions including Myocardial Infarction, Angina, Valve Disease, and Cardiac Arrhythmias.  Written material given at graduation.   Medication Safety: - Group verbal and visual instruction to review commonly prescribed medications for heart and lung disease. Reviews the medication, class of the drug, and side effects. Includes the steps to properly store meds and maintain the prescription regimen.  Written material given at graduation.   Other: -Provides group and verbal instruction on various topics (see comments)   Knowledge Questionnaire Score:    Core Components/Risk Factors/Patient Goals at Admission:   Education:Diabetes -  Individual verbal and written instruction to review signs/symptoms of diabetes, desired ranges of glucose level fasting, after meals and with exercise. Acknowledge that pre and post exercise glucose checks will be done for 3 sessions at entry of program.   Know Your Numbers and Heart Failure: - Group verbal and visual instruction to discuss disease risk factors for cardiac and pulmonary disease and treatment options.  Reviews associated critical values for Overweight/Obesity, Hypertension, Cholesterol, and Diabetes.  Discusses basics of heart failure: signs/symptoms and treatments.  Introduces Heart Failure Zone chart for action plan for heart failure.  Written material given at graduation.   Core Components/Risk Factors/Patient Goals Review:   Goals and Risk Factor Review     Row Name 05/03/23 1433 05/29/23 1409 06/19/23 1410 09/11/23 1425 10/11/23 1355     Core Components/Risk Factors/Patient Goals Review   Personal Goals  Review Improve shortness of breath with ADL's;Weight Management/Obesity;Increase knowledge of respiratory medications and ability to use respiratory devices properly. Weight Management/Obesity Weight Management/Obesity Weight Management/Obesity Weight Management/Obesity;Improve shortness of breath with ADL's   Review Echo is doing well with her weight and continues to work on weight loss.  Her breathing is getting better and she is no longer needing oxygen for exercise.  She is still using her nebulizer and doing well with it. Kathy Vega would like to continue to lose weight. She would like to lose weight before her surgery in September.Kathy Vega is going to work on losing a few pounds in the next couple weeks. Kathy Vega states she has no questions at this time about her medications or her health. She wants to continue working on weight loss. Kathy Vega wants to lose weight before her surgery in January. She wants to try to lose at least 50 pounds. Kathy Vega wants to lose weight before her surgery in January.  She states that she has been up and down with her weight but continues to work with nutrition and consistent exercise to aid in her weight loss. She wants to try to lose at least 50 pounds. Kathy Vega continues to deal with SOB but does feel stronger and works closely with medical team to manage SOB. She takes all respiratory meds as prescribed and is scheduled for her trach removal surgery in January.   Expected Outcomes Short: Continue to exercise to help with weight loss long: Continue to monitor risk factors Short: lose some weight. Long: Reach weight goal. Short: lose some weight. Long: Reach weight goal. Short: lose a few pounds in a couple weeks. Long: reach weight goal. Short: graduate from pulmonary rehab.  Long: reach weight goal and continue to improve with SOB.            Core Components/Risk Factors/Patient Goals at Discharge (Final Review):   Goals and Risk Factor Review - 10/11/23 1355       Core  Components/Risk Factors/Patient Goals Review   Personal Goals Review Weight Management/Obesity;Improve shortness of breath with ADL's    Review Kathy Vega wants to lose weight before her surgery in January.  She states that she has been up and down with her weight but continues to work with nutrition and consistent exercise to aid in her weight loss. She wants to try to lose at least 50 pounds. Kathy Vega continues to deal with SOB but does feel stronger and works closely with medical team to manage SOB. She takes all respiratory meds as prescribed and is scheduled for her trach removal surgery in January.    Expected Outcomes Short: graduate from pulmonary rehab.  Long: reach weight goal and continue to improve with SOB.             Kathy Vega Comments:  Kathy Vega Comments     Row Name 05/10/23 1129 06/06/23 1502 07/05/23 1201 08/02/23 1114 08/30/23 1241   Kathy Vega Comments 30 Day review completed. Medical Director Kathy Vega review done, changes made as directed, and signed approval by Medical Director. 30 Day review completed. Medical Director Kathy Vega review done, changes made as directed, and signed approval by Medical Director. 30 Day review completed. Medical Director Kathy Vega review done, changes made as directed, and signed approval by Medical Director. 30 Day review completed. Medical Director Kathy Vega review done, changes made as directed, and signed approval by Medical Director. 30 Day review completed. Medical Director Kathy Vega review done, changes made as directed, and signed approval by Medical Director.    Row Name 09/27/23 0856           Kathy Vega Comments 30 Day review completed. Medical Director Kathy Vega review done, changes made as directed, and signed approval by Medical Director.                Comments: Discharge Kathy Vega

## 2023-10-11 NOTE — Progress Notes (Signed)
Discharge Summary   Kathy Vega DOB: 1976/11/04  Kathy Vega graduated today from  rehab with 36 sessions completed.  Details of the patient's exercise prescription and what She needs to do in order to continue the prescription and progress were discussed with patient.  Patient was given a copy of prescription and goals.  Patient verbalized understanding. Kathy Vega plans to continue to exercise by walking.   6 Minute Walk     Row Name 09/13/23 1406         6 Minute Walk   Phase Discharge     Distance 1375 feet     Distance % Change 248 %     Distance Feet Change 980 ft     Walk Time 6 minutes     # of Rest Breaks 0     MPH 2.6     METS 3.91     RPE 13     Perceived Dyspnea  1     VO2 Peak 13.7     Symptoms No     Resting HR 89 bpm     Resting BP 110/62     Resting Oxygen Saturation  96 %     Exercise Oxygen Saturation  during 6 min walk 91 %     Max Ex. HR 126 bpm     Max Ex. BP 138/72     2 Minute Post BP 126/70       Interval HR   1 Minute HR 110     2 Minute HR 118     3 Minute HR 120     4 Minute HR 124     5 Minute HR 125     6 Minute HR 126     2 Minute Post HR 99     Interval Heart Rate? Yes       Interval Oxygen   Interval Oxygen? Yes     Baseline Oxygen Saturation % 96 %     1 Minute Oxygen Saturation % 97 %     1 Minute Liters of Oxygen 0 L  RA     2 Minute Oxygen Saturation % 96 %     2 Minute Liters of Oxygen 0 L     3 Minute Oxygen Saturation % 95 %     3 Minute Liters of Oxygen 0 L     4 Minute Oxygen Saturation % 94 %     4 Minute Liters of Oxygen 0 L     5 Minute Oxygen Saturation % 91 %     5 Minute Liters of Oxygen 0 L     6 Minute Oxygen Saturation % 93 %     6 Minute Liters of Oxygen 0 L     2 Minute Post Oxygen Saturation % 98 %     2 Minute Post Liters of Oxygen 0 L

## 2023-10-16 ENCOUNTER — Other Ambulatory Visit: Payer: Self-pay

## 2023-10-16 ENCOUNTER — Emergency Department
Admission: EM | Admit: 2023-10-16 | Discharge: 2023-10-16 | Disposition: A | Discharge status: Home / Self Care | Payer: 59 | Attending: Emergency Medicine | Admitting: Emergency Medicine

## 2023-10-16 DIAGNOSIS — M549 Dorsalgia, unspecified: Secondary | ICD-10-CM | POA: Diagnosis present

## 2023-10-16 DIAGNOSIS — M545 Low back pain, unspecified: Secondary | ICD-10-CM | POA: Insufficient documentation

## 2023-10-16 DIAGNOSIS — E039 Hypothyroidism, unspecified: Secondary | ICD-10-CM | POA: Insufficient documentation

## 2023-10-16 MED ORDER — CYCLOBENZAPRINE HCL 10 MG PO TABS
10.0000 mg | ORAL_TABLET | Freq: Once | ORAL | Status: AC
  Administered 2023-10-16: 10 mg via ORAL
  Filled 2023-10-16: qty 1

## 2023-10-16 MED ORDER — LIDOCAINE 5 % EX PTCH
1.0000 | MEDICATED_PATCH | CUTANEOUS | Status: DC
  Administered 2023-10-16: 1 via TRANSDERMAL
  Filled 2023-10-16: qty 1

## 2023-10-16 MED ORDER — HYDROCODONE-ACETAMINOPHEN 5-325 MG PO TABS: 1.0000 | ORAL_TABLET | Freq: Four times a day (QID) | ORAL | 0 refills | Status: DC | PRN

## 2023-10-16 MED ORDER — KETOROLAC TROMETHAMINE 15 MG/ML IJ SOLN
15.0000 mg | Freq: Once | INTRAMUSCULAR | Status: AC
  Administered 2023-10-16: 15 mg via INTRAMUSCULAR
  Filled 2023-10-16: qty 1

## 2023-10-16 MED ORDER — PREDNISONE 10 MG (21) PO TBPK: ORAL_TABLET | ORAL | 0 refills | Status: DC

## 2023-10-16 MED ORDER — LIDOCAINE 5 % EX PTCH: 1.0000 | MEDICATED_PATCH | Freq: Two times a day (BID) | CUTANEOUS | 0 refills | Status: DC

## 2023-10-16 MED ORDER — HYDROCODONE-ACETAMINOPHEN 5-325 MG PO TABS
1.0000 | ORAL_TABLET | Freq: Once | ORAL | Status: AC
  Administered 2023-10-16: 1 via ORAL
  Filled 2023-10-16: qty 1

## 2023-10-16 MED ORDER — BACLOFEN 10 MG PO TABS: 10.0000 mg | ORAL_TABLET | Freq: Three times a day (TID) | ORAL | 0 refills | Status: AC

## 2023-10-16 NOTE — Discharge Instructions (Signed)
Follow-up with regular doctor.  Please call them for an appointment.  You may need to see orthopedics.  There is no other imaging that we can do from the emergency department.  You had an x-ray and a CT.  CT was much more in-depth than an x-ray.  Therefore the next imaging you would need is an MRI which would have to be ordered by your primary care doctor or orthopedics.  We do not order these from the emergency department for back pain.  Take your medications as prescribed.  Apply ice to the lower back.  Try to do gentle stretches.  Return if worsening.

## 2023-10-16 NOTE — ED Provider Notes (Signed)
[facility] Provider Note    Event Date/Time   First MD Initiated Contact with Patient 10/16/23 1023     (approximate)   History   Back Pain   HPI  Kathy Vega is a 47 y.o. female with history of hypothyroidism, AKI, presents emergency department with chronic back pain.  Patient states worsened over past few days.  Was seen previously and had a CT which was negative.  States did not get an x-ray.  No numbness or tingling.  Pain is just with movement.      Physical Exam   Triage Vital Signs: ED Triage Vitals  Encounter Vitals Group     BP 10/16/23 1006 138/78     Systolic BP Percentile --      Diastolic BP Percentile --      Pulse Rate 10/16/23 1006 75     Resp 10/16/23 1006 20     Temp 10/16/23 1006 97.7 F (36.5 C)     Temp src --      SpO2 10/16/23 1006 100 %     Weight 10/16/23 0959 262 lb 5.6 oz (119 kg)     Height 10/16/23 0959 5\' 9"  (1.753 m)     Head Circumference --      Peak Flow --      Pain Score 10/16/23 0959 7     Pain Loc --      Pain Education --      Exclude from Growth Chart --     Most recent vital signs: Vitals:   10/16/23 1006  BP: 138/78  Pulse: 75  Resp: 20  Temp: 97.7 F (36.5 C)  SpO2: 100%     General: Awake, no distress.   CV:  Good peripheral perfusion. regular rate and  rhythm Resp:  Normal effort.  Abd:  No distention.   Other:  Lumbar spine nontender, 5/5 strength lower extremities, patient does move slowly as if in pain.   ED Results / Procedures / Treatments   Labs (all labs ordered are listed, but only abnormal results are displayed) Labs Reviewed - No data to display   EKG     RADIOLOGY     PROCEDURES:   Procedures   MEDICATIONS ORDERED IN ED: Medications  ketorolac (TORADOL) 15 MG/ML injection 15 mg (15 mg Intramuscular Given 10/16/23 1055)  cyclobenzaprine (FLEXERIL) tablet 10 mg (10 mg Oral Given 10/16/23 1054)  HYDROcodone-acetaminophen (NORCO/VICODIN) 5-325 MG  per tablet 1 tablet (1 tablet Oral Given 10/16/23 1054)     IMPRESSION / MDM / ASSESSMENT AND PLAN / ED COURSE  I reviewed the triage vital signs and the nursing notes.                              Differential diagnosis includes, but is not limited to, acute midline low back pain, chronic pain, lumbar radiculopathy, cauda equina, muscle spasms  Patient's presentation is most consistent with exacerbation of chronic illness.   A Lidoderm patch is placed on the patient's lower back, she was given Toradol 15 mg IM, Flexeril p.o. and Vaickute NPO.  I did explain to her I do not feel that we need to do further imaging.  A CT is much more specific than a plain x-ray.  She can follow-up with orthopedics if not improving.  Follow-up with her regular doctor for referral as needed.  She was given a prescription for baclofen, Lidoderm, Sterapred, and  a few hydrocodone for severe pain.  Patient is in agreement treatment plan.  She is discharged stable condition.      FINAL CLINICAL IMPRESSION(S) / ED DIAGNOSES   Final diagnoses:  Acute midline low back pain without sciatica     Rx / DC Orders   ED Discharge Orders          Ordered    predniSONE (STERAPRED UNI-PAK 21 TAB) 10 MG (21) TBPK tablet        10/16/23 1042    baclofen (LIORESAL) 10 MG tablet  3 times daily        10/16/23 1042    lidocaine (LIDODERM) 5 %  Every 12 hours        10/16/23 1042    HYDROcodone-acetaminophen (NORCO/VICODIN) 5-325 MG tablet  Every 6 hours PRN        10/16/23 1042             Note:  This document was prepared using Dragon voice recognition software and may include unintentional dictation errors.    Faythe Ghee, PA-C 10/16/23 1608    Jene Every, MD 10/19/23 [phone]

## 2023-10-16 NOTE — ED Triage Notes (Signed)
Pt presents to ED with c/o of back pain for the past 3 weeks, pt states no injury. NAD noted.

## 2023-10-17 LAB — GENECONNECT MOLECULAR SCREEN

## 2023-10-17 LAB — HELIX MOLECULAR SCREEN: Genetic Analysis Overall Interpretation: NEGATIVE

## 2023-10-19 ENCOUNTER — Encounter: Payer: Self-pay | Admitting: Internal Medicine

## 2023-10-24 ENCOUNTER — Encounter: Payer: Self-pay | Admitting: Internal Medicine

## 2023-11-10 ENCOUNTER — Inpatient Hospital Stay: Payer: 59 | Attending: Internal Medicine

## 2023-11-10 ENCOUNTER — Ambulatory Visit: Payer: 59

## 2023-11-10 ENCOUNTER — Inpatient Hospital Stay (HOSPITAL_BASED_OUTPATIENT_CLINIC_OR_DEPARTMENT_OTHER): Payer: 59 | Admitting: Internal Medicine

## 2023-11-10 ENCOUNTER — Encounter: Payer: Self-pay | Admitting: Internal Medicine

## 2023-11-10 VITALS — BP 133/79 | HR 90 | Temp 96.4°F | Resp 18

## 2023-11-10 DIAGNOSIS — Z87891 Personal history of nicotine dependence: Secondary | ICD-10-CM | POA: Diagnosis not present

## 2023-11-10 DIAGNOSIS — R7303 Prediabetes: Secondary | ICD-10-CM | POA: Diagnosis not present

## 2023-11-10 DIAGNOSIS — D509 Iron deficiency anemia, unspecified: Secondary | ICD-10-CM | POA: Insufficient documentation

## 2023-11-10 DIAGNOSIS — N920 Excessive and frequent menstruation with regular cycle: Secondary | ICD-10-CM | POA: Insufficient documentation

## 2023-11-10 DIAGNOSIS — Z8541 Personal history of malignant neoplasm of cervix uteri: Secondary | ICD-10-CM | POA: Insufficient documentation

## 2023-11-10 DIAGNOSIS — Z800 Family history of malignant neoplasm of digestive organs: Secondary | ICD-10-CM | POA: Insufficient documentation

## 2023-11-10 DIAGNOSIS — E611 Iron deficiency: Secondary | ICD-10-CM

## 2023-11-10 LAB — IRON AND TIBC
Iron: 87 ug/dL (ref 28–170)
Saturation Ratios: 21 % (ref 10.4–31.8)
TIBC: 410 ug/dL (ref 250–450)
UIBC: 323 ug/dL

## 2023-11-10 LAB — BASIC METABOLIC PANEL - CANCER CENTER ONLY
Anion gap: 9 (ref 5–15)
BUN: 15 mg/dL (ref 6–20)
CO2: 25 mmol/L (ref 22–32)
Calcium: 10.1 mg/dL (ref 8.9–10.3)
Chloride: 104 mmol/L (ref 98–111)
Creatinine: 0.73 mg/dL (ref 0.44–1.00)
GFR, Estimated: >60 mL/min (ref >60)
Glucose, Bld: 107 mg/dL — ABNORMAL HIGH (ref 70–99)
Potassium: 4.2 mmol/L (ref 3.5–5.1)
Sodium: 138 mmol/L (ref 135–145)

## 2023-11-10 LAB — CBC WITH DIFFERENTIAL (CANCER CENTER ONLY)
Abs Immature Granulocytes: 0.04 10*3/uL (ref 0.00–0.07)
Basophils Absolute: 0.1 10*3/uL (ref 0.0–0.1)
Basophils Relative: 1 %
Eosinophils Absolute: 0.1 10*3/uL (ref 0.0–0.5)
Eosinophils Relative: 1 %
HCT: 39.1 % (ref 36.0–46.0)
Hemoglobin: 12.9 g/dL (ref 12.0–15.0)
Immature Granulocytes: 1 %
Lymphocytes Relative: 23 %
Lymphs Abs: 1.9 10*3/uL (ref 0.7–4.0)
MCH: 26.4 pg (ref 26.0–34.0)
MCHC: 33 g/dL (ref 30.0–36.0)
MCV: 80.1 fL (ref 80.0–100.0)
Monocytes Absolute: 0.6 10*3/uL (ref 0.1–1.0)
Monocytes Relative: 8 %
Neutro Abs: 5.3 10*3/uL (ref 1.7–7.7)
Neutrophils Relative %: 66 %
Platelet Count: 358 10*3/uL (ref 150–400)
RBC: 4.88 MIL/uL (ref 3.87–5.11)
RDW: 14.3 % (ref 11.5–15.5)
WBC Count: 7.9 10*3/uL (ref 4.0–10.5)
nRBC: 0 % (ref 0.0–0.2)

## 2023-11-10 LAB — FERRITIN: Ferritin: 117 ng/mL (ref 11–307)

## 2023-11-10 NOTE — Progress Notes (Signed)
Feels fatigued all the time. Fingers feel tingling and cold. Dyspnea with exertion. Has dizziness with bending over. No blood in stool. Sleeps fair. No menstrual cycles since med given by OB GYN.

## 2023-11-10 NOTE — Progress Notes (Signed)
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  Accompanied by family.  Kathy Vega 47 y.o.  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  Patient feels fatigued all the time. Fingers feel tingling and cold.  Denies any shortness of breath or chest pain.  Not on oral iron.  No blood in stool. Sleeps fair. No menstrual cycles since med given by OB GYN   Review of Systems  Constitutional:  Positive for malaise/fatigue. Negative for chills, diaphoresis, fever and weight loss.  HENT:  Negative for nosebleeds and sore throat.   Eyes:  Negative for double vision.  Respiratory:  Negative for cough, hemoptysis, sputum production, shortness of breath and wheezing.   Cardiovascular:  Negative for chest pain, palpitations, orthopnea and leg swelling.  Gastrointestinal:  Negative for abdominal pain, blood in stool, constipation, diarrhea, heartburn, melena, nausea and vomiting.  Genitourinary:  Negative for dysuria, frequency and urgency.  Musculoskeletal:  Positive for back pain and joint pain.   Skin: Negative.  Negative for itching and rash.  Neurological:  Negative for dizziness, tingling, focal weakness, weakness and headaches.  Endo/Heme/Allergies:  Does not bruise/bleed easily.  Psychiatric/Behavioral:  Negative for depression. The patient is not nervous/anxious and does not have insomnia.     MEDICAL HISTORY:  Past Medical History:  Diagnosis Date   Acute encephalopathy 08/13/2020   Acute renal failure (ARF) (HCC) 08/13/2020   Anemia    Asthma    Cervical cancer (HCC)    Hypothyroidism    Iron deficiency anemia    Obesity (BMI 30-39.9)    Pneumonia due to COVID-19 virus 08/13/2020   requiring trach and prolonged hospitalization   Subglottic stenosis    Tracheal stenosis    Tracheostomy tube present [facility])     SURGICAL HISTORY: Past Surgical History:  Procedure Laterality Date   CARDIAC CATHETERIZATION     CERVICAL BIOPSY  W/ LOOP ELECTRODE EXCISION     CESAREAN SECTION     CHOLECYSTECTOMY     LARYNGOSCOPY  04/16/2021   LARYNGOSCOPY  02/25/2022   LARYNGOSCOPY  08/13/2021   LEG SURGERY     TRACHEOSTOMY TUBE PLACEMENT N/A 09/04/2020   Procedure: TRACHEOSTOMY;  Surgeon: Linus Salmons, MD;  Location: [facility] ORS;  Service: ENT;  Laterality: N/A;   TUBAL LIGATION      SOCIAL HISTORY: Social History   Socioeconomic History   Marital status: Married    Spouse name: Tiburcio Bash   Number of children: 3   Years of education: Not on file   Highest education level: Not on file  Occupational History   Not on file  Tobacco Use   Smoking status: Former    Current packs/day:  0.00    Average packs/day: 0.5 packs/day for 5.0 years (2.5 ttl pk-yrs)    Types: Cigarettes    Start date: 66    Quit date: 2000    Years since quitting: 24.9   Smokeless tobacco: Never  Vaping Use   Vaping status: Never Used  Substance and Sexual Activity   Alcohol use: No   Drug use: Never   Sexual activity: Yes    Birth control/protection: Surgical    Comment: BTL  Other  Topics Concern   Not on file  Social History Narrative   Not on file   Social Drivers of Health   Financial Resource Strain: Low Risk  (09/29/2023)   Received from [facility]   Overall Financial Resource Strain (CARDIA)    Difficulty of Paying Living Expenses: Not very hard  Food Insecurity: Patient Declined (09/29/2023)   Received from [facility]   Hunger Vital Sign    Worried About Running Out of Food in the Last Year: Patient declined    Ran Out of Food in the Last Year: Patient declined  Transportation Needs: Not on file  Physical Activity: Not on file  Stress: Not on file  Social Connections: Not on file  Intimate Partner Violence: Not on file    FAMILY HISTORY: Family History  Problem Relation Age of Onset   Hyperlipidemia Mother    Hypertension Mother    Heart disease Father    Liver cancer Father    Breast cancer Neg Hx    Ovarian cancer Neg Hx     ALLERGIES:  has no known allergies.  MEDICATIONS:  Current Outpatient Medications  Medication Sig Dispense Refill   albuterol (PROVENTIL) (2.5 MG/3ML) 0.083% nebulizer solution Take 3 mLs (2.5 mg total) by nebulization every 6 (six) hours as needed for wheezing or shortness of breath. 360 mL 11   ascorbic acid (VITAMIN C) 500 MG tablet Take 1 tablet (500 mg total) by mouth daily. 30 tablet 0   bisoprolol (ZEBETA) 5 MG tablet Take 1 tablet (5 mg total) by mouth daily. 30 tablet 6   budesonide (PULMICORT) 0.5 MG/2ML nebulizer solution Take 2 mLs (0.5 mg total) by nebulization 2 (two) times daily. 120 mL 5   formoterol (PERFOROMIST) 20 MCG/2ML nebulizer solution Take 2 mLs (20 mcg total) by nebulization 2 (two) times daily. Use before the budesonide, twice a day. 120 mL 11   furosemide (LASIX) 20 MG tablet Take 1 tablet (20 mg total) by mouth daily as needed for edema (shortness of breath). 90 tablet 3   HYDROcodone-acetaminophen (NORCO/VICODIN) 5-325 MG tablet Take 1 tablet by mouth  every 6 (six) hours as needed for moderate pain (pain score 4-6). 10 tablet 0   hydrOXYzine (ATARAX) 10 MG tablet Take 10 mg by mouth 3 (three) times daily.     iron polysaccharides (NIFEREX) 150 MG capsule Take 1 capsule (150 mg total) by mouth daily. 30 capsule 1   levothyroxine (SYNTHROID) 125 MCG tablet Take 250 mcg by mouth daily.     lidocaine (LIDODERM) 5 % Place 1 patch onto the skin every 12 (twelve) hours. Remove & Discard patch within 12 hours or as directed by MD 10 patch 0   norethindrone (AYGESTIN) 5 MG tablet Take 2 tablets (10 mg total) by mouth daily. 30 tablet 1   nystatin cream (MYCOSTATIN) Apply topically 2 (two) times daily. 30 g 0   OXYGEN Inhale into the lungs at bedtime as needed. Up to 4 liters  acetaminophen (TYLENOL) 500 MG tablet Take 500 mg by mouth every 6 (six) hours as needed for headache, fever, moderate pain or mild pain.     pantoprazole (PROTONIX) 40 MG tablet Take 1 tablet (40 mg total) by mouth daily. 30 tablet 2   propranolol (INDERAL) 10 MG tablet Take 1 tablet (10 mg total) by mouth 3 (three) times daily as needed (palpitations). 90 tablet 6   No current facility-administered medications for this visit.    PHYSICAL EXAMINATION:   Vitals:   11/10/23 1251  BP: 133/79  Pulse: 90  Resp: 18  Temp: (!) 96.4 F (35.8 C)  SpO2: 100%    There were no vitals filed for this visit.   Trach in place.   Physical Exam Vitals and nursing note reviewed.  HENT:     Head: Normocephalic and atraumatic.     Mouth/Throat:     Pharynx: Oropharynx is clear.  Eyes:     Extraocular Movements: Extraocular movements intact.     Pupils: Pupils are equal, round, and reactive to light.  Cardiovascular:     Rate and Rhythm: Normal rate and regular rhythm.  Pulmonary:     Comments: Decreased breath sounds bilaterally.  Abdominal:     Palpations: Abdomen is soft.  Musculoskeletal:        General: Normal range of motion.     Cervical back: Normal range of  motion.  Skin:    General: Skin is warm.  Neurological:     General: No focal deficit present.     Mental Status: She is alert and oriented to person, place, and time.  Psychiatric:        Behavior: Behavior normal.        Judgment: Judgment normal.      LABORATORY DATA:  I have reviewed the data as listed Lab Results  Component Value Date   WBC 7.9 11/10/2023   HGB 12.9 11/10/2023   HCT 39.1 11/10/2023   MCV 80.1 11/10/2023   PLT 358 11/10/2023   Recent Labs    07/12/23 1227 10/06/23 1139 11/10/23 1230  NA 137 136 138  K 3.8 4.0 4.2  CL 104 100 104  CO2 24 27 25   GLUCOSE 111* 107* 107*  BUN 11 20 15   CREATININE 0.74 0.80 0.73  CALCIUM 9.7 9.7 10.1  GFRNONAA >60 >60 >60     No results found.  ASSESSMENT & PLAN:   Iron deficiency #Chronic severe anemia- Hb- 7-8; iron saturation 2% [AUG 2023]-very symptomatic.  Likely secondary to menorrhagia; currently improved..   # Today Hb 12.9 - HOLD venofer today. #Recommend gentle iron [iron biglycinate; 28 mg ] 1 pill a day.  This pill is unlikely to cause stomach upset or cause constipation.   #Etiology of iron deficiency: Likely secondary to menorrhagia. S/p Dr. Logan Bores gynecology- Improved- Discussed/awaiting PCP  GI referral for screening colonoscopy.   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  Stable.   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 Etiology- ? Prediabetes- defer to PCP- Stable.    # DISPOSITION: # HOLD venofer  # follow up in 6  months- MD: labs- cbc/bmp;; iron studies; ferritin;possible Venofer- Dr.B    All questions were answered. The patient knows to call the clinic with any problems, questions or concerns.  Earna Coder, MD 11/10/2023 1:22 PM

## 2023-11-10 NOTE — Patient Instructions (Signed)
#  Recommend gentle iron [iron biglycinate; 28 mg ] 1 pill a day.  This pill is unlikely to cause stomach upset or cause constipation.  

## 2023-11-10 NOTE — Assessment & Plan Note (Addendum)
#  Chronic severe anemia- Hb- 7-8; iron saturation 2% [AUG 2023]-very symptomatic.  Likely secondary to menorrhagia; currently improved..   # Today Hb 12.9 - HOLD venofer today. #Recommend gentle iron [iron biglycinate; 28 mg ] 1 pill a day.  This pill is unlikely to cause stomach upset or cause constipation.   #Etiology of iron deficiency: Likely secondary to menorrhagia. S/p Dr. Logan Bores gynecology- Improved- Discussed/awaiting PCP  GI referral for screening colonoscopy.   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  Stable.   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 Etiology- ? Prediabetes- defer to PCP- Stable.    # DISPOSITION: # HOLD venofer  # follow up in 6  months- MD: labs- cbc/bmp;; iron studies; ferritin;possible Venofer- Dr.B

## 2023-11-13 ENCOUNTER — Ambulatory Visit: Payer: 59 | Admitting: Physical Therapy

## 2023-11-15 ENCOUNTER — Other Ambulatory Visit: Payer: Self-pay

## 2023-11-15 ENCOUNTER — Other Ambulatory Visit
Admission: RE | Admit: 2023-11-15 | Discharge: 2023-11-15 | Disposition: A | Discharge status: Home / Self Care | Payer: Self-pay | Source: Ambulatory Visit | Attending: Medical Genetics | Admitting: Medical Genetics

## 2023-11-15 ENCOUNTER — Encounter: Payer: 59 | Admitting: Physical Therapy

## 2023-11-15 DIAGNOSIS — Z006 Encounter for examination for normal comparison and control in clinical research program: Secondary | ICD-10-CM | POA: Insufficient documentation

## 2023-11-20 ENCOUNTER — Encounter: Payer: 59 | Admitting: Physical Therapy

## 2023-11-23 ENCOUNTER — Encounter: Payer: 59 | Admitting: Physical Therapy

## 2023-11-28 ENCOUNTER — Ambulatory Visit: Payer: 59 | Attending: Physical Therapy | Admitting: Physical Therapy

## 2023-11-28 ENCOUNTER — Encounter: Payer: Self-pay | Admitting: Internal Medicine

## 2023-11-30 ENCOUNTER — Ambulatory Visit: Payer: 59 | Admitting: Physical Therapy

## 2023-12-05 ENCOUNTER — Ambulatory Visit: Payer: 59 | Admitting: Physical Therapy

## 2023-12-07 ENCOUNTER — Ambulatory Visit: Payer: 59 | Admitting: Physical Therapy

## 2023-12-12 ENCOUNTER — Ambulatory Visit: Payer: 59 | Admitting: Physical Therapy

## 2023-12-14 ENCOUNTER — Ambulatory Visit: Payer: 59 | Admitting: Physical Therapy

## 2023-12-19 ENCOUNTER — Ambulatory Visit: Payer: 59 | Admitting: Physical Therapy

## 2023-12-21 ENCOUNTER — Ambulatory Visit: Payer: 59 | Admitting: Physical Therapy

## 2023-12-26 ENCOUNTER — Ambulatory Visit: Payer: 59 | Admitting: Physical Therapy

## 2023-12-28 ENCOUNTER — Ambulatory Visit: Payer: 59 | Admitting: Physical Therapy

## 2024-01-02 ENCOUNTER — Ambulatory Visit: Payer: 59 | Admitting: Physical Therapy

## 2024-01-04 ENCOUNTER — Ambulatory Visit: Payer: 59 | Admitting: Physical Therapy

## 2024-01-09 ENCOUNTER — Ambulatory Visit: Payer: 59 | Admitting: Physical Therapy

## 2024-01-11 ENCOUNTER — Ambulatory Visit: Payer: 59 | Admitting: Physical Therapy

## 2024-01-16 ENCOUNTER — Ambulatory Visit: Payer: 59 | Admitting: Physical Therapy

## 2024-01-18 ENCOUNTER — Ambulatory Visit: Payer: 59 | Admitting: Physical Therapy

## 2024-01-23 ENCOUNTER — Ambulatory Visit: Payer: 59 | Admitting: Physical Therapy

## 2024-01-25 ENCOUNTER — Ambulatory Visit: Payer: 59 | Admitting: Physical Therapy

## 2024-01-30 ENCOUNTER — Ambulatory Visit: Payer: 59 | Admitting: Physical Therapy

## 2024-02-01 ENCOUNTER — Ambulatory Visit: Payer: 59 | Admitting: Physical Therapy

## 2024-02-06 ENCOUNTER — Ambulatory Visit: Payer: 59 | Admitting: Physical Therapy

## 2024-02-08 ENCOUNTER — Ambulatory Visit: Payer: 59 | Admitting: Physical Therapy

## 2024-02-12 ENCOUNTER — Telehealth: Payer: Self-pay | Admitting: Cardiovascular Disease

## 2024-02-12 NOTE — Telephone Encounter (Signed)
 Left voicemail, pt is not due to appt until June, left message to see if patient needs appt with [facility] soon after receiving request via mychart.

## 2024-02-13 ENCOUNTER — Ambulatory Visit: Payer: 59 | Admitting: Physical Therapy

## 2024-02-15 ENCOUNTER — Ambulatory Visit: Payer: 59 | Admitting: Physical Therapy

## 2024-02-20 ENCOUNTER — Ambulatory Visit: Payer: 59 | Admitting: Physical Therapy

## 2024-02-29 ENCOUNTER — Encounter: Payer: Self-pay | Admitting: Internal Medicine

## 2024-03-07 ENCOUNTER — Ambulatory Visit: Admitting: Pulmonary Disease

## 2024-03-08 ENCOUNTER — Ambulatory Visit: Admitting: Pulmonary Disease

## 2024-03-12 ENCOUNTER — Ambulatory Visit: Admitting: Pulmonary Disease

## 2024-03-18 NOTE — ED Provider Notes (Signed)
 EMAP test result follow-up note  March 18, 2024 6:17 PM   I was contacted by the ED resource nurse Teddi Drones) with regard to [employer]. She was seen in the [facility] Emergency Department on 03/14/24 at which time wound culture was done which has returned with the following result: 1 Result Note     View Follow-Up Encounter <redacted file path> Aerobic/Anaerobic Culture 4+ Methicillin resistant Staphylococcus aureus Abnormal   3+ Pseudomonas aeruginosa Abnormal   Remainder of Culture in Progress Abnormal     Specimen Source: Neck        Gram Stain Result 3+ Polymorphonuclear leukocytes  3+ Gram positive cocci       Resulting Agency: [facility]   Susceptibility   Methicillin resistant Staphylococcus aureus Pseudomonas aeruginosa    MIC SUSCEPTIBILITY RESULT (Preliminary) KIRBY BAUER (Preliminary) KIRBY BAUER (Preliminary)    Ceftazidime    Susceptible    Ciprofloxacin    Susceptible    Clindamycin   Susceptible     Doxycycline    Susceptible     Erythromycin   Resistant     Fluoroquinolone   No Interpretation 1     Gentamicin   Susceptible 2     Levofloxacin    Susceptible    Linezolid   Susceptible     Nafcillin   Resistant 3     Piperacillin + Tazobactam    Susceptible    Tobramycin    Susceptible    Trimethoprim  + Sulfamethoxazole    Susceptible     Vancomycin 2 Susceptible                 1 Fluoroquinolones are not indicated for the treatment of staphylococcal infections, including MRSA.  2 Gentamicin is used only in combination with other active agents that test susceptible  3 Methicillin-resistant staphylococci are resistant to all currently available beta-lactam antibiotics EXCEPT ceftaroline.Contact [facility] at [phone] to request ceftaroline susceptibility testing.         I have reviewed the patient's chart from this ED visit. Pt had presented with neck swelling with concern for abscess.  She had been seen by ENT, status post drainage started on on Augmentin  and sent home.  With culture has returned with both MRSA as well as Pseudomonas.  Thankfully, it does appear to be susceptible to different oral medications.  Will plan to do a trial of oral doxycycline  and ciprofloxacin.  The ED resource nurse will contact the patient, inform her of the positive test result(s) and prescriptions will be started.  We will also do a check to see whether she is feeling poorly, given the severe nature of the organisms, return precautions should be reviewed and she should be instructed to follow-up as directed at the time of her ED visit.m

## 2024-04-16 ENCOUNTER — Ambulatory Visit (INDEPENDENT_AMBULATORY_CARE_PROVIDER_SITE_OTHER)

## 2024-04-16 ENCOUNTER — Encounter: Payer: Self-pay | Admitting: Podiatry

## 2024-04-16 ENCOUNTER — Ambulatory Visit (INDEPENDENT_AMBULATORY_CARE_PROVIDER_SITE_OTHER): Admitting: Podiatry

## 2024-04-16 DIAGNOSIS — M7751 Other enthesopathy of right foot: Secondary | ICD-10-CM | POA: Diagnosis not present

## 2024-04-16 DIAGNOSIS — M778 Other enthesopathies, not elsewhere classified: Secondary | ICD-10-CM | POA: Diagnosis not present

## 2024-04-16 DIAGNOSIS — M722 Plantar fascial fibromatosis: Secondary | ICD-10-CM | POA: Diagnosis not present

## 2024-04-16 DIAGNOSIS — M7671 Peroneal tendinitis, right leg: Secondary | ICD-10-CM | POA: Diagnosis not present

## 2024-04-16 MED ORDER — BETAMETHASONE SOD PHOS & ACET 6 (3-3) MG/ML IJ SUSP: 3.0000 mg | Freq: Once | INTRAMUSCULAR | Status: AC

## 2024-04-16 NOTE — Progress Notes (Signed)
 Chief Complaint  Patient presents with   Foot Pain    "I have burning on the bottom of my foot." N - burning L - plantar pain right D - 02/15/2024 O - suddenly, I had surgery and they put an IV in my leg C - constant sharp pain, numbness, burns, constantly hurts A - walking T - went to my main doctor, Pregabalin, Tylenol      HPI: 48 y.o. female presenting today as a new patient for evaluation of right foot pain.  She believes the pain may have been initiated when an IV was put in her right leg when she had surgery.  DOS: 02/15/2024.  Since that time she has pain in the bottom of her foot as well as the lateral aspect of her foot.  She also experiences burning and numbness at night when she lays down.  Past Medical History:  Diagnosis Date   Acute encephalopathy 08/13/2020   Acute renal failure (ARF) (HCC) 08/13/2020   Anemia    Asthma    Cervical cancer (HCC)    Hypothyroidism    Iron  deficiency anemia    Obesity (BMI 30-39.9)    Pneumonia due to COVID-19 virus 08/13/2020   requiring trach and prolonged hospitalization   Subglottic stenosis    Tracheal stenosis    Tracheostomy tube present [facility])     Past Surgical History:  Procedure Laterality Date   CARDIAC CATHETERIZATION     CERVICAL BIOPSY  W/ LOOP ELECTRODE EXCISION     CESAREAN SECTION     CHOLECYSTECTOMY     LARYNGOSCOPY  04/16/2021   LARYNGOSCOPY  02/25/2022   LARYNGOSCOPY  08/13/2021   LEG SURGERY     TRACHEOSTOMY TUBE PLACEMENT N/A 09/04/2020   Procedure: TRACHEOSTOMY;  Surgeon: Lesly Raspberry, MD;  Location: [facility] ORS;  Service: ENT;  Laterality: N/A;   TUBAL LIGATION      No Known Allergies   Physical Exam: General: The patient is alert and oriented x3 in no acute distress.  Dermatology: Skin is warm, dry and supple bilateral lower extremities.   Vascular: Palpable pedal pulses bilaterally. Capillary refill within normal limits.  No appreciable edema.  No erythema.  Neurological: Grossly intact  via light touch  Musculoskeletal Exam: No pedal deformities noted.  Tenderness with palpation noted to the plantar fascia right as well as the fifth metatarsal tubercle at the insertion of the peroneal tendon right  Radiographic Exam RT foot 04/16/2021 5:  Normal osseous mineralization. Joint spaces preserved.  No fractures or osseous irregularities noted.  Impression: Negative  Assessment/Plan of Care: 1.  Plantar fasciitis right 2.  Peroneal tendinitis right 3.  Nocturnal paresthesia and numbness right foot  -Patient evaluated.  X-rays reviewed -Injection of 0.5 cc Celestone Soluspan injected into the plantar fascia right as well as the fifth metatarsal tubercle at the insertion of the peroneal tendon right -Compression ankle sleeve dispensed.  Wear daily -Advised against going barefoot.  Recommend good supportive tennis shoes and sneakers -Return to clinic as needed       Dot Gazella, DPM [facility]  Dr. Dot Gazella, DPM    [address] [place], [place] [address]  Office [phone]  Fax [phone]

## 2024-04-18 ENCOUNTER — Ambulatory Visit (INDEPENDENT_AMBULATORY_CARE_PROVIDER_SITE_OTHER): Admitting: Pulmonary Disease

## 2024-04-18 ENCOUNTER — Encounter: Payer: Self-pay | Admitting: Pulmonary Disease

## 2024-04-18 VITALS — BP 128/86 | HR 61 | Temp 97.7°F | Wt 284.8 lb

## 2024-04-18 DIAGNOSIS — E039 Hypothyroidism, unspecified: Secondary | ICD-10-CM | POA: Diagnosis not present

## 2024-04-18 DIAGNOSIS — G4736 Sleep related hypoventilation in conditions classified elsewhere: Secondary | ICD-10-CM

## 2024-04-18 DIAGNOSIS — Z930 Tracheostomy status: Secondary | ICD-10-CM

## 2024-04-18 DIAGNOSIS — J8410 Pulmonary fibrosis, unspecified: Secondary | ICD-10-CM

## 2024-04-18 DIAGNOSIS — J4540 Moderate persistent asthma, uncomplicated: Secondary | ICD-10-CM | POA: Diagnosis not present

## 2024-04-18 DIAGNOSIS — E669 Obesity, unspecified: Secondary | ICD-10-CM | POA: Diagnosis not present

## 2024-04-18 DIAGNOSIS — Z87891 Personal history of nicotine dependence: Secondary | ICD-10-CM

## 2024-04-18 DIAGNOSIS — J386 Stenosis of larynx: Secondary | ICD-10-CM | POA: Diagnosis not present

## 2024-04-18 MED ORDER — FORMOTEROL FUMARATE 20 MCG/2ML IN NEBU: 20.0000 ug | INHALATION_SOLUTION | Freq: Two times a day (BID) | RESPIRATORY_TRACT | 11 refills | Status: DC

## 2024-04-18 MED ORDER — BUDESONIDE 0.5 MG/2ML IN SUSP: 0.5000 mg | Freq: Two times a day (BID) | RESPIRATORY_TRACT | 11 refills | Status: DC

## 2024-04-18 NOTE — Progress Notes (Signed)
 Subjective:    Patient ID: Kathy Vega, female    DOB: 08-Apr-1976, 48 y.o.   MRN: [id number]  Patient Care Team: Center, [facility] as PCP - General (General Practice) Marc Senior, MD as Consulting Physician (Pulmonary Disease) Gwyn Leos, MD as Consulting Physician (Internal Medicine)  Chief Complaint  Patient presents with   Follow-up    No cough or wheezing. Occasional shortness of breath on exertion.     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  Patient was last seen here on 17 August 2023 by me.  This is a scheduled visit.  As noted she has tracheal stenosis at has been followed by Dr. Joyceann No at [facility]  She currently has a # 4 Shiley.  She is tolerating Passy-Muir valve well.   HPI Discussed the use of AI scribe software for clinical note transcription with the patient, who gave verbal consent to proceed.  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  She has been following up regarding her tracheostomy dependence, with a potential plan for another reconstruction to remove the tracheostomy. The next procedure is scheduled for 20 June.  She is currently using nebulizers for control of her asthma. She recently completed a course of antibiotics. She is also on Pulmicort  and Performist, with prescriptions filled at [facility] pharmacy. No wheezing today.  She is taking Synthroid  twice a day for thyroid  management, with her last follow-up in November. Her thyroid  medication is managed by a provider at the [facility].      She does not have any complaints today.  She has had no fevers,  chills or sweats.    Review of Systems A 10 point review of systems was performed and it is as noted above otherwise negative.   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(HCC) 09/18/2020   Acute respiratory failure (HCC)    Pneumonia due to COVID-19 virus 08/13/2020   Acute renal failure (ARF) (HCC) 08/13/2020   Elevated liver enzymes 08/13/2020   CAP (community acquired pneumonia) 08/13/2020   Acute encephalopathy 08/13/2020   Hypokalemia 08/13/2020    Social History   Tobacco Use   Smoking status: Former    Current packs/day: 0.00    Average packs/day: 0.5 packs/day for 5.0 years (2.5 ttl pk-yrs)    Types: Cigarettes    Start date: 76    Quit date: 2000    Years since quitting: 25.4   Smokeless tobacco: Never  Substance Use Topics   Alcohol use: No    No Known Allergies  Current Meds  Medication Sig   acetaminophen  (TYLENOL ) 500 MG tablet Take 500 mg by mouth every 6 (six) hours as needed for headache, fever, moderate pain or mild pain.   albuterol  (PROVENTIL ) (2.5 MG/3ML) 0.083% nebulizer  solution Take 3 mLs (2.5 mg total) by nebulization every 6 (six) hours as needed for wheezing or shortness of breath.   ascorbic acid  (VITAMIN C) 500 MG tablet Take 1 tablet (500 mg total) by mouth daily.   bisoprolol  (ZEBETA ) 5 MG tablet Take 1 tablet (5 mg total) by mouth daily.   furosemide  (LASIX ) 20 MG tablet Take 1 tablet (20 mg total) by mouth daily as needed for edema (shortness of breath).   HYDROcodone -acetaminophen  (NORCO/VICODIN) 5-325 MG tablet Take 1 tablet  by mouth every 6 (six) hours as needed for moderate pain (pain score 4-6).   hydrOXYzine (ATARAX) 10 MG tablet Take 10 mg by mouth 3 (three) times daily.   iron  polysaccharides (NIFEREX) 150 MG capsule Take 1 capsule (150 mg total) by mouth daily.   levothyroxine  (SYNTHROID ) 125 MCG tablet Take 250 mcg by mouth daily.   lidocaine  (LIDODERM ) 5 % Place 1 patch onto the skin every 12 (twelve) hours. Remove & Discard patch within 12 hours or as directed by MD   norethindrone  (AYGESTIN ) 5 MG tablet Take 2 tablets (10 mg total) by mouth daily.   nystatin  cream (MYCOSTATIN ) Apply topically 2 (two) times daily.   OXYGEN Inhale into the lungs at bedtime as needed. Up to 4 liters   pantoprazole  (PROTONIX ) 40 MG tablet Take 1 tablet (40 mg total) by mouth daily.   propranolol  (INDERAL ) 10 MG tablet Take 1 tablet (10 mg total) by mouth 3 (three) times daily as needed (palpitations).   [DISCONTINUED] budesonide  (PULMICORT ) 0.5 MG/2ML nebulizer solution Take 2 mLs (0.5 mg total) by nebulization 2 (two) times daily.   [DISCONTINUED] formoterol  (PERFOROMIST ) 20 MCG/2ML nebulizer solution Take 2 mLs (20 mcg total) by nebulization 2 (two) times daily. Use before the budesonide , twice a day.   Current Facility-Administered Medications for the 04/18/24 encounter (Office Visit) with Marc Senior, MD  Medication   betamethasone  acetate-betamethasone  sodium phosphate  (CELESTONE ) injection 3 mg     There is no immunization history on file for this patient.      Objective:     BP 128/86 (BP Location: Right Arm, Patient Position: Sitting, Cuff Size: Normal)   Pulse 61   Temp 97.7 F (36.5 C) (Temporal)   Wt 284 lb 12.8 oz (129.2 kg)   LMP 03/28/2022 (Approximate)   SpO2 100%   BMI 42.06 kg/m   SpO2: 100 %  GENERAL: Obese woman, no acute distress, tracheostomy in place.  She has a Passy-Muir valve and able to speak without difficulty.  Not wearing oxygen. HEAD: Normocephalic, atraumatic.  EYES:  Pupils equal, round, reactive to light.  No scleral icterus.  MOUTH: Oral mucosa moist.  No thrush. NECK: Supple. No lymphadenopathy.  Tracheostomy in place as above with Passy-Muir valve. No JVD.  Trachea midline. PULMONARY: Good air entry bilaterally.  To auscultation bilaterally.   CARDIOVASCULAR: S1 and S2. Regular rate and rhythm.  No rubs, murmurs or gallops heard. ABDOMEN: Obese otherwise unremarkable. MUSCULOSKELETAL: No joint deformity, no clubbing, no edema.  NEUROLOGIC: No overt focal deficit. SKIN: Intact,warm,dry. PSYCH: Mood and behavior normal.    Assessment & Plan:     ICD-10-CM   1. Moderate persistent asthma without complication  J45.40 budesonide  (PULMICORT ) 0.5 MG/2ML nebulizer solution   Use albuterol  at least twice a day via neb Follow-up with Pulmicort  0.5 mg twice daily via neb Reorder nebulizer machine    2. Subglottic stenosis  J38.6     3. 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 Use before the budesonide , twice a day.    Dispense:  120 mL    Refill:  11   budesonide  (PULMICORT ) 0.5 MG/2ML nebulizer solution    Sig: Take 2 mLs (0.5 mg total) by nebulization 2 (two) times daily.    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 Under the care of Dr. Joyceann No, with a planned reconstruction procedure in June for potential tracheostomy removal.  Asthma Asthma is well-controlled with no wheezing during examination. She continues on nebulizers, Pulmicort , and Performist. - Continue Pulmicort  and Performist prescriptions. - Verify pharmacy details  for medication refills.  Hypothyroidism Hypothyroidism managed with Synthroid , taken twice daily. Last follow-up in November, monitored by a provider at the [facility].       Advised if symptoms do not improve or worsen, to please contact office for sooner follow up or seek emergency care.    I spent 30 minutes of dedicated to the care of this patient on the date of this encounter to include pre-visit review of records, face-to-face time with the patient discussing conditions above, post visit ordering of testing, clinical documentation with the electronic health record, making appropriate referrals as documented, and communicating necessary findings to members of the patients care team.     C. Chloe Counter, MD Advanced Bronchoscopy PCCM [facility]    *This note was generated using voice recognition software/Dragon and/or AI transcription program.  Despite best efforts to proofread, errors Vega occur which Vega change the meaning. Any transcriptional errors that result from this process are unintentional and may not be fully corrected at the time of dictation.

## 2024-04-18 NOTE — Patient Instructions (Signed)
 VISIT SUMMARY:  Today, you came in for a follow-up appointment to discuss your tracheostomy dependence, asthma, and hypothyroidism. We reviewed your current treatments and made plans for your ongoing care.  YOUR PLAN:  -TRACHEOSTOMY DEPENDENCE: You have a tracheostomy due to complications from a COVID-19 infection. You are under the care of Dr. Joyceann No and have a planned reconstruction procedure in June to potentially remove the tracheostomy.  -ASTHMA: Your asthma is well-controlled, and there was no wheezing during today's examination. You should continue using your nebulizers, Pulmicort , and Performist. Please verify your pharmacy details to ensure your medication refills are accurate.  -HYPOTHYROIDISM: Hypothyroidism is a condition where your thyroid  does not produce enough hormones. You are managing this with Synthroid , which you take twice daily. Your last follow-up was in November, and your thyroid  medication is monitored by a provider at the [facility].  INSTRUCTIONS:  Your next procedure for potential tracheostomy removal is scheduled for June. Please continue with your current medications and verify your pharmacy details for asthma medication refills.  Will see you in follow-up in 3 months time call sooner should any new problems arise.

## 2024-05-17 ENCOUNTER — Ambulatory Visit: Payer: 59 | Admitting: Internal Medicine

## 2024-05-17 ENCOUNTER — Ambulatory Visit: Payer: 59

## 2024-05-17 ENCOUNTER — Other Ambulatory Visit: Payer: 59

## 2024-05-19 NOTE — Progress Notes (Deleted)
 NO SHOW

## 2024-05-20 ENCOUNTER — Ambulatory Visit: Attending: Cardiovascular Disease | Admitting: Cardiovascular Disease

## 2024-05-20 DIAGNOSIS — N170 Acute kidney failure with tubular necrosis: Secondary | ICD-10-CM

## 2024-05-20 DIAGNOSIS — J9601 Acute respiratory failure with hypoxia: Secondary | ICD-10-CM

## 2024-05-20 DIAGNOSIS — R0602 Shortness of breath: Secondary | ICD-10-CM

## 2024-05-20 DIAGNOSIS — R600 Localized edema: Secondary | ICD-10-CM

## 2024-05-20 DIAGNOSIS — I420 Dilated cardiomyopathy: Secondary | ICD-10-CM

## 2024-05-20 DIAGNOSIS — D649 Anemia, unspecified: Secondary | ICD-10-CM

## 2024-05-20 DIAGNOSIS — R002 Palpitations: Secondary | ICD-10-CM

## 2024-05-23 ENCOUNTER — Encounter: Payer: Self-pay | Admitting: Internal Medicine

## 2024-05-24 ENCOUNTER — Encounter: Payer: Self-pay | Admitting: Internal Medicine

## 2024-05-28 ENCOUNTER — Inpatient Hospital Stay

## 2024-05-28 ENCOUNTER — Inpatient Hospital Stay: Admitting: Nurse Practitioner

## 2024-06-04 DIAGNOSIS — M899 Disorder of bone, unspecified: Secondary | ICD-10-CM | POA: Insufficient documentation

## 2024-06-04 DIAGNOSIS — Z789 Other specified health status: Secondary | ICD-10-CM | POA: Insufficient documentation

## 2024-06-04 DIAGNOSIS — Z79899 Other long term (current) drug therapy: Secondary | ICD-10-CM | POA: Insufficient documentation

## 2024-06-04 DIAGNOSIS — G894 Chronic pain syndrome: Secondary | ICD-10-CM | POA: Insufficient documentation

## 2024-06-04 NOTE — Patient Instructions (Signed)

## 2024-06-04 NOTE — Progress Notes (Unsigned)
 PROVIDER NOTE: Interpretation of information contained herein should be left to medically-trained personnel. Specific patient instructions are provided elsewhere under Patient Instructions section of medical record. This document was created in part using AI and STT-dictation technology, any transcriptional errors that may result from this process are unintentional.  Patient: Kathy Vega  Service: E/M Encounter  Provider: Eric DELENA Como, MD  DOB: 03/20/1976  Delivery: Face-to-face  Specialty: Interventional Pain Management  MRN: [id number]  Setting: Ambulatory outpatient facility  Specialty designation: 09  Type: New Patient  Location: Outpatient office facility  [facility]P: [facility]  DOS: 06/05/2024    Referring Prov.: Cecily Katz, PA-C   Primary Reason(s) for Visit: Encounter for initial evaluation of one or more chronic problems (new to examiner) potentially causing chronic pain, and posing a threat to normal musculoskeletal function. (Level of risk: High) CC: No chief complaint on file.  HPI  Kathy Vega is a 48 y.o. year old, female patient, who comes for the first time to our practice referred by Cecily Katz, PA-C for our initial evaluation of her chronic pain. 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 Today she comes in for evaluation of her No chief  complaint on file.  Pain Assessment: Location:     Radiating:   Onset:   Duration:   Quality:   Severity:  /10 (subjective, self-reported pain score)  Effect on ADL:   Timing:   Modifying factors:   BP:    HR:    Onset and Duration: {Hx; Onset and Duration:210120511} Cause of pain: {Hx; Cause:210120521} Severity: {Pain Severity:210120502} Timing: {Symptoms; Timing:210120501} Aggravating Factors: {Causes; Aggravating pain factors:210120507} Alleviating Factors: {Causes; Alleviating Factors:210120500} Associated Problems: {Hx; Associated problems:210120515} Quality of Pain: {Hx; Symptom quality or Descriptor:210120531} Previous Examinations or Tests: {Hx; Previous examinations or test:210120529} Previous Treatments: {Hx; Previous Treatment:210120503}  Kathy Vega is being evaluated for possible interventional pain management therapies for the treatment of her chronic pain.  Discussed the use of AI scribe software for clinical note transcription with the patient, who gave verbal consent to proceed.  History of Present Illness           ***  Kathy Vega has been informed that this initial visit was an evaluation only.  On the follow up appointment I will go over the results, including ordered tests and available interventional therapies. At that time she will have the opportunity to decide whether to proceed with offered therapies or not. In the event that Kathy Vega prefers avoiding interventional options, this will conclude our involvement in the case.  Medication management recommendations may be provided upon request.  Patient informed that diagnostic tests may be ordered to assist in identifying underlying causes, narrow the list of differential diagnoses and aid in determining candidacy for (or contraindications to) planned therapeutic interventions.  Historic Controlled Substance Pharmacotherapy Review PMP and historical list of controlled substances: ***  Most recently  prescribed controlled substance(s): Opioid Analgesic: *** MME/day: *** mg/day  Historical Monitoring: The patient  reports no history of drug use.  List of prior UDS Testing: Lab Results  Component Value Date   ETH <10 08/14/2020   Historical Background Evaluation: [place] PMP: PDMP reviewed during this encounter. Review of the past 38-months conducted.             PMP NARX Score Report:  Narcotic: *** Sedative: *** Stimulant: *** [place] Department of public safety, offender search: Engineer, mining Information) Non-contributory Risk Assessment Profile: Aberrant behavior: None observed or detected today Risk factors for fatal opioid overdose: None identified today PMP NARX Overdose Risk Score: *** Fatal overdose hazard ratio (HR): Calculation deferred Non-fatal overdose hazard ratio (HR): Calculation deferred Risk of opioid abuse or dependence: 0.7-3.0% with doses <= 36 MME/day and 6.1-26% with doses >= 120 MME/day. Substance use disorder (SUD) risk level: See below Personal History of Substance Abuse (SUD-Substance use disorder):  Alcohol:    Illegal Drugs:    Rx Drugs:    ORT Risk Level calculation:    ORT Scoring interpretation table:  Score <3 = Low Risk for SUD  Score between 4-7 = Moderate Risk for SUD  Score >8 = High Risk for Opioid Abuse   PHQ-2 Depression Scale:  Total score:    PHQ-2 Scoring interpretation table: (Score and probability of major depressive disorder)  Score 0 = No depression  Score 1 = 15.4% Probability  Score 2 = 21.1% Probability  Score 3 = 38.4% Probability  Score 4 = 45.5% Probability  Score 5 = 56.4% Probability  Score 6 = 78.6% Probability   PHQ-9 Depression Scale:  Total score:    PHQ-9 Scoring interpretation table:  Score 0-4 = No depression  Score 5-9 = Mild depression  Score 10-14 = Moderate depression  Score 15-19 = Moderately severe depression  Score 20-27 = Severe depression (2.4 times higher risk of SUD and 2.89 times higher risk of overuse)    Pharmacologic Plan: As per protocol, I have not taken over any controlled substance management, pending the results of ordered tests and/or consults.            Initial impression: Pending review of available data and ordered tests.  Meds   Current Outpatient Medications:    acetaminophen  (TYLENOL ) 500 MG tablet, Take 500 mg by mouth every 6 (six) hours as needed for headache, fever, moderate pain or mild pain., Disp: , Rfl:    albuterol  (PROVENTIL ) (2.5 MG/3ML) 0.083% nebulizer solution, Take 3 mLs (2.5 mg total) by nebulization every 6 (six) hours as needed for wheezing or shortness of breath., Disp: 360 mL, Rfl: 11   ascorbic acid  (VITAMIN C) 500 MG tablet, Take 1 tablet (500 mg total) by mouth daily., Disp: 30 tablet, Rfl: 0   bisoprolol  (ZEBETA ) 5 MG tablet, Take 1 tablet (5 mg total) by mouth daily., Disp: 30 tablet, Rfl: 6   budesonide  (PULMICORT ) 0.5 MG/2ML nebulizer solution, Take 2 mLs (0.5 mg total) by nebulization 2 (two) times daily., Disp: 120 mL, Rfl: 11   formoterol  (PERFOROMIST ) 20 MCG/2ML nebulizer solution, Take 2 mLs (20 mcg total) by nebulization 2 (two) times daily. Use before the budesonide , twice a day., Disp: 120 mL, Rfl: 11   furosemide  (LASIX ) 20 MG tablet, Take 1 tablet (20 mg total) by mouth daily as needed for edema (shortness of breath)., Disp: 90 tablet, Rfl: 3   HYDROcodone -acetaminophen  (NORCO/VICODIN) 5-325 MG tablet, Take 1 tablet by mouth every 6 (six) hours as needed for moderate pain (pain score 4-6)., Disp: 10 tablet, Rfl: 0   hydrOXYzine (ATARAX) 10 MG tablet, Take  10 mg by mouth 3 (three) times daily., Disp: , Rfl:    iron  polysaccharides (NIFEREX) 150 MG capsule, Take 1 capsule (150 mg total) by mouth daily., Disp: 30 capsule, Rfl: 1   levothyroxine  (SYNTHROID ) 125 MCG tablet, Take 250 mcg by mouth daily., Disp: , Rfl:    lidocaine  (LIDODERM ) 5 %, Place 1 patch onto the skin every 12 (twelve) hours. Remove & Discard patch within 12 hours or as directed  by MD, Disp: 10 patch, Rfl: 0   norethindrone  (AYGESTIN ) 5 MG tablet, Take 2 tablets (10 mg total) by mouth daily., Disp: 30 tablet, Rfl: 1   nystatin  cream (MYCOSTATIN ), Apply topically 2 (two) times daily., Disp: 30 g, Rfl: 0   OXYGEN, Inhale into the lungs at bedtime as needed. Up to 4 liters, Disp: , Rfl:    pantoprazole  (PROTONIX ) 40 MG tablet, Take 1 tablet (40 mg total) by mouth daily., Disp: 30 tablet, Rfl: 2   propranolol  (INDERAL ) 10 MG tablet, Take 1 tablet (10 mg total) by mouth 3 (three) times daily as needed (palpitations)., Disp: 90 tablet, Rfl: 6  Current Facility-Administered Medications:    betamethasone  acetate-betamethasone  sodium phosphate  (CELESTONE ) injection 3 mg, 3 mg, Intra-articular, Once,   Imaging Review  Cervical Imaging: Cervical MR wo contrast: No results found for this or any previous visit.  Cervical MR wo contrast: No results found for this or any previous visit.  Cervical MR w/wo contrast: No results found for this or any previous visit.  Cervical MR w contrast: No results found for this or any previous visit.  Cervical CT wo contrast: No results found for this or any previous visit.  Cervical CT w/wo contrast: No results found for this or any previous visit.  Cervical CT w/wo contrast: No results found for this or any previous visit.  Cervical CT w contrast: No results found for this or any previous visit.  Cervical CT outside: No results found for this or any previous visit.  Cervical DG 1 view: No results found for this or any previous visit.  Cervical DG 2-3 views: No results found for this or any previous visit.  Cervical DG F/E views: No results found for this or any previous visit.  Cervical DG 2-3 clearing views: No results found for this or any previous visit.  Cervical DG Bending/F/E views: No results found for this or any previous visit.  Cervical DG complete: No results found for this or any previous visit.  Cervical DG  Myelogram views: No results found for this or any previous visit.  Cervical DG Myelogram views: No results found for this or any previous visit.  Cervical Discogram views: No results found for this or any previous visit.   Shoulder Imaging: Shoulder-R MR w contrast: No results found for this or any previous visit.  Shoulder-L MR w contrast: No results found for this or any previous visit.  Shoulder-R MR w/wo contrast: No results found for this or any previous visit.  Shoulder-L MR w/wo contrast: No results found for this or any previous visit.  Shoulder-R MR wo contrast: No results found for this or any previous visit.  Shoulder-L MR wo contrast: No results found for this or any previous visit.  Shoulder-R CT w contrast: No results found for this or any previous visit.  Shoulder-L CT w contrast: No results found for this or any previous visit.  Shoulder-R CT w/wo contrast: No results found for this or any previous visit.  Shoulder-L CT w/wo contrast: No  results found for this or any previous visit.  Shoulder-R CT wo contrast: No results found for this or any previous visit.  Shoulder-L CT wo contrast: No results found for this or any previous visit.  Shoulder-R DG Arthrogram: No results found for this or any previous visit.  Shoulder-L DG Arthrogram: No results found for this or any previous visit.  Shoulder-R DG 1 view: No results found for this or any previous visit.  Shoulder-L DG 1 view: No results found for this or any previous visit.  Shoulder-R DG: No results found for this or any previous visit.  Shoulder-L DG: No results found for this or any previous visit.   Thoracic Imaging: Thoracic MR wo contrast: No results found for this or any previous visit.  Thoracic MR wo contrast: No results found for this or any previous visit.  Thoracic MR w/wo contrast: No results found for this or any previous visit.  Thoracic MR w contrast: No results found for this or any  previous visit.  Thoracic CT wo contrast: No results found for this or any previous visit.  Thoracic CT w/wo contrast: No results found for this or any previous visit.  Thoracic CT w/wo contrast: No results found for this or any previous visit.  Thoracic CT w contrast: No results found for this or any previous visit.  Thoracic DG 2-3 views: No results found for this or any previous visit.  Thoracic DG 4 views: No results found for this or any previous visit.  Thoracic DG: No results found for this or any previous visit.  Thoracic DG w/swimmers view: No results found for this or any previous visit.  Thoracic DG Myelogram views: No results found for this or any previous visit.  Thoracic DG Myelogram views: No results found for this or any previous visit.   Lumbosacral Imaging: Lumbar MR wo contrast: No results found for this or any previous visit.  Lumbar MR wo contrast: No results found for this or any previous visit.  Lumbar MR w/wo contrast: No results found for this or any previous visit.  Lumbar MR w/wo contrast: No results found for this or any previous visit.  Lumbar MR w contrast: No results found for this or any previous visit.  Lumbar CT wo contrast: Results for orders placed during the hospital encounter of 03/30/23  CT Lumbar Spine Wo Contrast  Narrative CLINICAL DATA:  Low back pain, cauda equina syndrome suspected Sudden sharp lower back pain after lifting item a week ago  EXAM: CT LUMBAR SPINE WITHOUT CONTRAST  TECHNIQUE: Multidetector CT imaging of the lumbar spine was performed without intravenous contrast administration. Multiplanar CT image reconstructions were also generated.  RADIATION DOSE REDUCTION: This exam was performed according to the departmental dose-optimization program which includes automated exposure control, adjustment of the mA and/or kV according to patient size and/or use of iterative reconstruction technique.  COMPARISON:   None Available.  FINDINGS: Segmentation: 5 lumbar type vertebrae.  Alignment: Normal.  Vertebrae: No acute fracture or focal pathologic process.  Paraspinal and other soft tissues: Negative.  Disc levels: Maintained. No disc herniation. No central canal stenosis or neural foraminal narrowing.  IMPRESSION: Unremarkable study.   Electronically Signed By: Franky Crease M.D. On: 03/30/2023 15:51  Lumbar CT w/wo contrast: No results found for this or any previous visit.  Lumbar CT w/wo contrast: No results found for this or any previous visit.  Lumbar CT w contrast: No results found for this or any previous visit.  Lumbar  DG 1V: No results found for this or any previous visit.  Lumbar DG 1V (Clearing): No results found for this or any previous visit.  Lumbar DG 2-3V (Clearing): No results found for this or any previous visit.  Lumbar DG 2-3 views: Results for orders placed during the hospital encounter of 03/26/20  DG Lumbar Spine 2-3 Views  Narrative CLINICAL DATA:  Low back pain  EXAM: LUMBAR SPINE - 2-3 VIEW  COMPARISON:  None.  FINDINGS: There is no evidence of lumbar spine fracture. Alignment is normal. Intervertebral disc spaces are maintained. Mild lower lumbar facet arthropathy. Bilateral SI joints appear within normal limits.  IMPRESSION: No acute findings. Mild lower lumbar facet arthropathy.   Electronically Signed By: Mabel Converse D.O. On: 03/26/2020 16:28  Lumbar DG (Complete) 4+V: No results found for this or any previous visit.        Lumbar DG F/E views: No results found for this or any previous visit.        Lumbar DG Bending views: No results found for this or any previous visit.        Lumbar DG Myelogram views: No results found for this or any previous visit.  Lumbar DG Myelogram: No results found for this or any previous visit.  Lumbar DG Myelogram: No results found for this or any previous visit.  Lumbar DG Myelogram: No  results found for this or any previous visit.  Lumbar DG Myelogram Lumbosacral: No results found for this or any previous visit.  Lumbar DG Diskogram views: No results found for this or any previous visit.  Lumbar DG Diskogram views: No results found for this or any previous visit.  Lumbar DG Epidurogram OP: No results found for this or any previous visit.  Lumbar DG Epidurogram IP: No results found for this or any previous visit.   Sacroiliac Joint Imaging: Sacroiliac Joint DG: No results found for this or any previous visit.  Sacroiliac Joint MR w/wo contrast: No results found for this or any previous visit.  Sacroiliac Joint MR wo contrast: No results found for this or any previous visit.   Spine Imaging: Whole Spine DG Myelogram views: No results found for this or any previous visit.  Whole Spine MR Mets screen: No results found for this or any previous visit.  Whole Spine MR Mets screen: No results found for this or any previous visit.  Whole Spine MR w/wo: No results found for this or any previous visit.  MRA Spinal Canal w/ cm: No results found for this or any previous visit.  MRA Spinal Canal wo/ cm: No results found for this or any previous visit.  MRA Spinal Canal w/wo cm: No results found for this or any previous visit.  Spine Outside MR Films: No results found for this or any previous visit.  Spine Outside CT Films: No results found for this or any previous visit.  CT-Guided Biopsy: No results found for this or any previous visit.  CT-Guided Needle Placement: No results found for this or any previous visit.  DG Spine outside: No results found for this or any previous visit.  IR Spine outside: No results found for this or any previous visit.  NM Spine outside: No results found for this or any previous visit.   Hip Imaging: Hip-R MR w contrast: No results found for this or any previous visit.  Hip-L MR w contrast: No results found for this or any  previous visit.  Hip-R MR w/wo contrast: No results  found for this or any previous visit.  Hip-L MR w/wo contrast: No results found for this or any previous visit.  Hip-R MR wo contrast: No results found for this or any previous visit.  Hip-L MR wo contrast: No results found for this or any previous visit.  Hip-R CT w contrast: No results found for this or any previous visit.  Hip-L CT w contrast: No results found for this or any previous visit.  Hip-R CT w/wo contrast: No results found for this or any previous visit.  Hip-L CT w/wo contrast: No results found for this or any previous visit.  Hip-R CT wo contrast: No results found for this or any previous visit.  Hip-L CT wo contrast: No results found for this or any previous visit.  Hip-R DG 2-3 views: No results found for this or any previous visit.  Hip-L DG 2-3 views: No results found for this or any previous visit.  Hip-R DG Arthrogram: No results found for this or any previous visit.  Hip-L DG Arthrogram: No results found for this or any previous visit.  Hip-B DG Bilateral: No results found for this or any previous visit.  Hip-B DG Bilateral (5V): No results found for this or any previous visit.   Knee Imaging: Knee-R MR w contrast: No results found for this or any previous visit.  Knee-L MR w/o contrast: No results found for this or any previous visit.  Knee-R MR w/wo contrast: No results found for this or any previous visit.  Knee-L MR w/wo contrast: No results found for this or any previous visit.  Knee-R MR wo contrast: No results found for this or any previous visit.  Knee-L MR wo contrast: No results found for this or any previous visit.  Knee-R CT w contrast: No results found for this or any previous visit.  Knee-L CT w contrast: No results found for this or any previous visit.  Knee-R CT w/wo contrast: No results found for this or any previous visit.  Knee-L CT w/wo contrast: No results found for  this or any previous visit.  Knee-R CT wo contrast: No results found for this or any previous visit.  Knee-L CT wo contrast: No results found for this or any previous visit.  Knee-R DG 1-2 views: No results found for this or any previous visit.  Knee-L DG 1-2 views: No results found for this or any previous visit.  Knee-R DG 3 views: No results found for this or any previous visit.  Knee-L DG 3 views: No results found for this or any previous visit.  Knee-R DG 4 views: Results for orders placed during the hospital encounter of 11/10/15  DG Knee Complete 4 Views Right  Narrative CLINICAL DATA:  Right knee pain for 2 weeks.  EXAM: RIGHT KNEE - COMPLETE 4+ VIEW  COMPARISON:  None.  FINDINGS: The patella is high riding. No evidence for a patellar fracture. Mild spurring and degenerative changes at the patellofemoral compartment. No evidence for acute fracture, dislocation or large joint effusion.  IMPRESSION: No acute bone abnormality.  Patella [place].   Electronically Signed By: Juliene Balder M.D. On: 11/10/2015 09:28  Knee-L DG 4 views: No results found for this or any previous visit.  Knee-R DG Arthrogram: No results found for this or any previous visit.  Knee-L DG Arthrogram: No results found for this or any previous visit.   Ankle Imaging: Ankle-R DG Complete: No results found for this or any previous visit.  Ankle-L DG Complete:  No results found for this or any previous visit.   Foot Imaging: Foot-R DG Complete: Results for orders placed in visit on 04/16/24  DG Foot Complete Right  Narrative Please see detailed radiograph report in office note.  Foot-L DG Complete: No results found for this or any previous visit.   Elbow Imaging: Elbow-R DG Complete: No results found for this or any previous visit.  Elbow-L DG Complete: Results for orders placed during the hospital encounter of 05/05/15  DG Elbow Complete Left  Narrative CLINICAL DATA:  Left  elbow pain for 2 months, no known injury, initial encounter  EXAM: LEFT ELBOW - COMPLETE 3+ VIEW  COMPARISON:  None.  FINDINGS: There is no evidence of fracture, dislocation, or joint effusion. There is no evidence of arthropathy or other focal bone abnormality. Soft tissues are unremarkable.  IMPRESSION: No acute abnormality noted.   Electronically Signed By: Oneil Devonshire M.D. On: 05/05/2015 12:02   Wrist Imaging: Wrist-R DG Complete: No results found for this or any previous visit.  Wrist-L DG Complete: No results found for this or any previous visit.   Hand Imaging: Hand-R DG Complete: No results found for this or any previous visit.  Hand-L DG Complete: No results found for this or any previous visit.   Complexity Note: Imaging results reviewed.                         ROS  Cardiovascular: {Hx; Cardiovascular History:210120525} Pulmonary or Respiratory: {Hx; Pumonary and/or Respiratory History:210120523} Neurological: {Hx; Neurological:210120504} Psychological-Psychiatric: {Hx; Psychological-Psychiatric History:210120512} Gastrointestinal: {Hx; Gastrointestinal:210120527} Genitourinary: {Hx; Genitourinary:210120506} Hematological: {Hx; Hematological:210120510} Endocrine: {Hx; Endocrine history:210120509} Rheumatologic: {Hx; Rheumatological:210120530} Musculoskeletal: {Hx; Musculoskeletal:210120528} Work History: {Hx; Work history:210120514}  Allergies  Ms. Coale has no known allergies.  Laboratory Chemistry Profile   Renal Lab Results  Component Value Date   BUN 15 11/10/2023   CREATININE 0.73 11/10/2023   BCR 11 03/09/2022   GFRAA >60 09/01/2020   GFRNONAA >60 11/10/2023   PROTEINUR NEGATIVE 10/06/2023     Electrolytes Lab Results  Component Value Date   NA 138 11/10/2023   K 4.2 11/10/2023   CL 104 11/10/2023   CALCIUM 10.1 11/10/2023   MG 2.2 07/18/2022   PHOS 3.9 07/18/2022     Hepatic Lab Results  Component Value Date   AST 26  08/14/2022   ALT 26 08/14/2022   ALBUMIN 4.4 08/14/2022   ALKPHOS 68 08/14/2022   LIPASE 22 08/30/2016     ID Lab Results  Component Value Date   HIV Non Reactive 07/18/2022   SARSCOV2NAA NEGATIVE 04/27/2022   MRSAPCR NEGATIVE 09/22/2020   HCVAB NON REACTIVE 08/13/2020   PREGTESTUR NEGATIVE 10/06/2023     Bone No results found for: VD25OH, [id number], CI6874NY7, CI7874NY7, 25OHVITD1, 25OHVITD2, 25OHVITD3, TESTOFREE, TESTOSTERONE   Endocrine Lab Results  Component Value Date   GLUCOSE 107 (H) 11/10/2023   GLUCOSEU NEGATIVE 10/06/2023   TSH 167.262 (H) 07/16/2022   FREET4 0.29 (L) 05/12/2022   CRTSLPL 5.0 05/12/2022     Neuropathy Lab Results  Component Value Date   VITAMINB12 230 07/16/2022   FOLATE 15.1 07/16/2022   HIV Non Reactive 07/18/2022     CNS No results found for: COLORCSF, APPEARCSF, RBCCOUNTCSF, WBCCSF, POLYSCSF, LYMPHSCSF, EOSCSF, PROTEINCSF, GLUCCSF, JCVIRUS, CSFOLI, IGGCSF, LABACHR, ACETBL   Inflammation (CRP: Acute  ESR: Chronic) Lab Results  Component Value Date   CRP 1.1 (H) 07/29/2021   ESRSEDRATE 58 (H) 07/29/2021   LATICACIDVEN 1.9 07/18/2022  Rheumatology Lab Results  Component Value Date   ANA Negative 07/29/2021     Coagulation Lab Results  Component Value Date   INR 1.1 08/14/2022   LABPROT 13.6 08/14/2022   PLT 358 11/10/2023   DDIMER 0.27 04/01/2022     Cardiovascular Lab Results  Component Value Date   BNP 12.3 04/27/2022   TROPONINI <0.03 04/03/2017   HGB 12.9 11/10/2023   HCT 39.1 11/10/2023     Screening Lab Results  Component Value Date   SARSCOV2NAA NEGATIVE 04/27/2022   MRSAPCR NEGATIVE 09/22/2020   HCVAB NON REACTIVE 08/13/2020   HIV Non Reactive 07/18/2022   PREGTESTUR NEGATIVE 10/06/2023     Cancer No results found for: CEA, CA125, LABCA2   Allergens No results found for: ALMOND, APPLE, ASPARAGUS, AVOCADO, BANANA, BARLEY, BASIL,  BAYLEAF, GREENBEAN, LIMABEAN, WHITEBEAN, BEEFIGE, REDBEET, BLUEBERRY, BROCCOLI, CABBAGE, MELON, CARROT, CASEIN, CASHEWNUT, CAULIFLOWER, CELERY     Note: Lab results reviewed.  PFSH  Drug: Ms. Treloar  reports no history of drug use. Alcohol:  reports no history of alcohol use. Tobacco:  reports that she quit smoking about 25 years ago. Her smoking use included cigarettes. She started smoking about 30 years ago. She has a 2.5 pack-year smoking history. She has never used smokeless tobacco. Medical:  has a past medical history of Acute encephalopathy (08/13/2020), Acute renal failure (ARF) (HCC) (08/13/2020), Anemia, Asthma, Cervical cancer (HCC), Hypothyroidism, Iron  deficiency anemia, Obesity (BMI 30-39.9), Pneumonia due to COVID-19 virus (08/13/2020), Subglottic stenosis, Tracheal stenosis, and Tracheostomy tube present (HCC). Family: family history includes Heart disease in her father; Hyperlipidemia in her mother; Hypertension in her mother; Liver cancer in her father.  Past Surgical History:  Procedure Laterality Date   CARDIAC CATHETERIZATION     CERVICAL BIOPSY  W/ LOOP ELECTRODE EXCISION     CESAREAN SECTION     CHOLECYSTECTOMY     LARYNGOSCOPY  04/16/2021   LARYNGOSCOPY  02/25/2022   LARYNGOSCOPY  08/13/2021   LEG SURGERY     TRACHEOSTOMY TUBE PLACEMENT N/A 09/04/2020   Procedure: TRACHEOSTOMY;  Surgeon: Herminio Miu, MD;  Location: [facility] ORS;  Service: ENT;  Laterality: N/A;   TUBAL LIGATION     Active Ambulatory Problems    Diagnosis Date Noted   Pneumonia due to COVID-19 virus 08/13/2020   Acute renal failure (ARF) (HCC) 08/13/2020   Elevated liver enzymes 08/13/2020   CAP (community acquired pneumonia) 08/13/2020   Acute encephalopathy 08/13/2020   Hypokalemia 08/13/2020   Acute respiratory failure (HCC)    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(HCC) 09/18/2020   Intubation of airway performed  without difficulty    Pain    Asthma 03/23/2021   Tracheal stenosis 03/23/2021   Acute on chronic blood loss anemia, symptomatic 07/28/2021   Iron  deficiency anemia due to chronic blood loss 07/16/2022   Obesity (BMI 30-39.9) 07/16/2022   Hypothyroidism    Iron  deficiency 07/19/2022   Chronic pain syndrome 06/04/2024   Pharmacologic therapy 06/04/2024   Disorder of skeletal system 06/04/2024   Problems influencing health status 06/04/2024   Resolved Ambulatory Problems    Diagnosis Date Noted   No Resolved Ambulatory Problems   Past Medical History:  Diagnosis Date   Anemia    Cervical cancer (HCC)    Iron  deficiency anemia    Subglottic stenosis    Tracheostomy tube present (HCC)    Constitutional Exam  General appearance: Well nourished, well developed, and well hydrated. In no apparent acute  distress There were no vitals filed for this visit. BMI Assessment: Estimated body mass index is 42.06 kg/m as calculated from the following:   Height as of 10/16/23: 5' 9 (1.753 m).   Weight as of 04/18/24: 284 lb 12.8 oz (129.2 kg).  BMI interpretation table: BMI level Category Range association with higher incidence of chronic pain  <18 kg/m2 Underweight   18.5-24.9 kg/m2 Ideal body weight   25-29.9 kg/m2 Overweight Increased incidence by 20%  30-34.9 kg/m2 Obese (Class I) Increased incidence by 68%  35-39.9 kg/m2 Severe obesity (Class II) Increased incidence by 136%  >40 kg/m2 Extreme obesity (Class III) Increased incidence by 254%   Patient's current BMI Ideal Body weight  There is no height or weight on file to calculate BMI. Patient weight not recorded   BMI Readings from Last 4 Encounters:  04/18/24 42.06 kg/m  10/16/23 38.74 kg/m  10/06/23 38.99 kg/m  09/13/23 39.22 kg/m   Wt Readings from Last 4 Encounters:  04/18/24 284 lb 12.8 oz (129.2 kg)  10/16/23 262 lb 5.6 oz (119 kg)  10/06/23 264 lb (119.7 kg)  09/13/23 265 lb 9.6 oz (120.5 kg)    Psych/Mental  status: Alert, oriented x 3 (person, place, & time)       Eyes: PERLA Respiratory: No evidence of acute respiratory distress  Assessment  Primary Diagnosis & Pertinent Problem List: The primary encounter diagnosis was Chronic pain syndrome. Diagnoses of Pharmacologic therapy, Disorder of skeletal system, and Problems influencing health status were also pertinent to this visit.  Visit Diagnosis (New problems to examiner): 1. Chronic pain syndrome   2. Pharmacologic therapy   3. Disorder of skeletal system   4. Problems influencing health status    Plan of Care (Initial workup plan)  Note: Ms. Otte was reminded that as per protocol, today's visit has been an evaluation only. We have not taken over the patient's controlled substance management.  Problem-specific plan: Assessment and Plan            Lab Orders  No laboratory test(s) ordered today   Imaging Orders  No imaging studies ordered today   Referral Orders  No referral(s) requested today   Procedure Orders    No procedure(s) ordered today   Pharmacotherapy (current): Medications ordered:  No orders of the defined types were placed in this encounter.  Medications administered during this visit: Andrea M. Herbig had no medications administered during this visit.   Analgesic Pharmacotherapy:  Opioid Analgesics: For patients currently taking or requesting to take opioid analgesics, in accordance with [place]  Medical Board Guidelines, we will assess their risks and indications for the use of these substances. After completing our evaluation, we may offer recommendations, but we no longer take patients for medication management. The prescribing physician will ultimately decide, based on his/her training and level of comfort whether to adopt any of the recommendations, including whether or not to prescribe such medicines.  Membrane stabilizer: To be determined at a later time  Muscle relaxant: To be determined at a  later time  NSAID: To be determined at a later time  Other analgesic(s): To be determined at a later time   Interventional management options: Ms. Bartnick was informed that there is no guarantee that she would be a candidate for interventional therapies. The decision will be based on the results of diagnostic studies, as well as Ms. Jolly's risk profile.  Procedure(s) under consideration:  Pending results of ordered studies     Interventional Therapies  Risk Factors  Considerations  Medical Comorbidities:     Planned  Pending:      Under consideration:   Pending   Completed: (Analgesic benefit)1  None at this time   Therapeutic  Palliative (PRN) options:   None established   Completed by other providers:   None reported  1(Analgesic benefit): Expressed in percentage (%). (Local anesthetic[LA] +/- sedation  L.A.Local Anesthetic  Steroid benefit  Ongoing benefit)   Provider-requested follow-up: No follow-ups on file.  Future Appointments  Date Time Provider Department Center  06/05/2024 11:00 AM Tanya Glisson, MD [facility] None  06/11/2024  4:20 PM Perla Evalene PARAS, MD [facility] None  06/20/2024  2:45 PM [facility] None  06/20/2024  3:00 PM Dasie Tinnie MATSU, NP [facility] None  06/20/2024  3:30 PM [facility] INFUSION CHAIR 3 [facility] None  07/18/2024  3:15 PM Tamea Dedra CROME, MD [facility] None   I discussed the assessment and treatment plan with the patient. The patient was provided an opportunity to ask questions and all were answered. The patient agreed with the plan and demonstrated an understanding of the instructions.  Patient advised to call back or seek an in-person evaluation if the symptoms or condition worsens.  Duration of encounter: *** minutes.  Total time on encounter, as per AMA guidelines included both the face-to-face and non-face-to-face time personally spent by the physician and/or other qualified health care professional(s) on the day of the  encounter (includes time in activities that require the physician or other qualified health care professional and does not include time in activities normally performed by clinical staff). Physician's time may include the following activities when performed: Preparing to see the patient (e.g., pre-charting review of records, searching for previously ordered imaging, lab work, and nerve conduction tests) Review of prior analgesic pharmacotherapies. Reviewing PMP Interpreting ordered tests (e.g., lab work, imaging, nerve conduction tests) Performing post-procedure evaluations, including interpretation of diagnostic procedures Obtaining and/or reviewing separately obtained history Performing a medically appropriate examination and/or evaluation Counseling and educating the patient/family/caregiver Ordering medications, tests, or procedures Referring and communicating with other health care professionals (when not separately reported) Documenting clinical information in the electronic or other health record Independently interpreting results (not separately reported) and communicating results to the patient/ family/caregiver Care coordination (not separately reported)  Note by: Glisson DELENA Tanya, MD (TTS and AI technology used. I apologize for any typographical errors that were not detected and corrected.) Date: 06/05/2024; Time: 7:46 AM

## 2024-06-05 ENCOUNTER — Ambulatory Visit
Admission: RE | Admit: 2024-06-05 | Discharge: 2024-06-05 | Disposition: A | Discharge status: Home / Self Care | Source: Ambulatory Visit | Attending: Pain Medicine | Admitting: Pain Medicine

## 2024-06-05 ENCOUNTER — Ambulatory Visit (HOSPITAL_BASED_OUTPATIENT_CLINIC_OR_DEPARTMENT_OTHER): Admitting: Pain Medicine

## 2024-06-05 VITALS — BP 150/86 | Temp 98.0°F | Resp 16 | Ht 69.0 in | Wt 280.0 lb

## 2024-06-05 DIAGNOSIS — G8929 Other chronic pain: Secondary | ICD-10-CM

## 2024-06-05 DIAGNOSIS — Z930 Tracheostomy status: Secondary | ICD-10-CM

## 2024-06-05 DIAGNOSIS — M899 Disorder of bone, unspecified: Secondary | ICD-10-CM | POA: Diagnosis not present

## 2024-06-05 DIAGNOSIS — M79671 Pain in right foot: Secondary | ICD-10-CM

## 2024-06-05 DIAGNOSIS — Z79899 Other long term (current) drug therapy: Secondary | ICD-10-CM | POA: Insufficient documentation

## 2024-06-05 DIAGNOSIS — Z6841 Body Mass Index (BMI) 40.0 and over, adult: Secondary | ICD-10-CM | POA: Diagnosis not present

## 2024-06-05 DIAGNOSIS — Z789 Other specified health status: Secondary | ICD-10-CM | POA: Insufficient documentation

## 2024-06-05 DIAGNOSIS — E66813 Obesity, class 3: Secondary | ICD-10-CM | POA: Insufficient documentation

## 2024-06-05 DIAGNOSIS — G894 Chronic pain syndrome: Secondary | ICD-10-CM | POA: Diagnosis not present

## 2024-06-05 MED ORDER — PREDNISONE 20 MG PO TABS: ORAL_TABLET | ORAL | 0 refills | Status: AC

## 2024-06-05 NOTE — Progress Notes (Signed)
 Safety precautions to be maintained throughout the outpatient stay will include: orient to surroundings, keep bed in low position, maintain call bell within reach at all times, provide assistance with transfer out of bed and ambulation.

## 2024-06-10 LAB — COMPLIANCE DRUG ANALYSIS, UR

## 2024-06-10 NOTE — Progress Notes (Deleted)
 Cardiology Office Note  Date:  06/10/2024   ID:  Kathy, Vega 1976/06/19, MRN [id number]  PCP:  [facility]   No chief complaint on file.  HPI:  [name] is a 48 year old woman with past medical history of Admitted to the hospital 08/13/20 with COVID-19 pneumonia & ARDS requiring intubation on 9/17 and mechanical ventilation.   Proning protocols were followed while in ICU.   required maximal ventilation support with sedation and remained critically ill.  She was weaned to FiO2 60% on 10/1.  Tracheostomy was placed on 09/04/20.  Patient began tolerating trach collar O2 in short durations 10/12-13 and began OOB to chair.   tracheal aspirate culture growing Pseudomonas. Sepsis resolved before discharge. history of morbid obesity, Ef 45 to 50% now ejection fraction 50 to 55%  Who presents for follow-up of her shortness of breath, mildly decreased ejection fraction September 2022 in the setting of anemia, heavy menses,  leg edema, chest pain, irregular beats  Last seen in clinic 04/2023   Palpitations, started on bisoprolol  5 mg daily She reports today bisoprolol  was not covered by Medicaid She continues to have palpitations from PVCs Takes propranolol  as needed  Scheduled for trach removal September 2024 Trying to lose weight in preparation  Echo May 2023 EF 50 to 55%  History of heavy menses, started on new medication Previously treated with iron  infusions, l Hemoglobin 9.8   emergency room Apr 01, 2022 for palpitations  EKG personally reviewed by myself on todays visit      Other past medical history reviewed Oxygen machine at night  Problems with anemia, hemoglobin down in the 6 range requiring transfusion during September 2022   PMH:   has a past medical history of Acute encephalopathy (08/13/2020), Acute renal failure (ARF) (HCC) (08/13/2020), Anemia, Asthma, Cervical cancer (HCC), Hypothyroidism, Iron  deficiency anemia, Obesity (BMI  30-39.9), Pneumonia due to COVID-19 virus (08/13/2020), Subglottic stenosis, Tracheal stenosis, and Tracheostomy tube present (HCC).  PSH:    Past Surgical History:  Procedure Laterality Date   CARDIAC CATHETERIZATION     CERVICAL BIOPSY  W/ LOOP ELECTRODE EXCISION     CESAREAN SECTION     CHOLECYSTECTOMY     LARYNGOSCOPY  04/16/2021   LARYNGOSCOPY  02/25/2022   LARYNGOSCOPY  08/13/2021   LEG SURGERY     TRACHEOSTOMY TUBE PLACEMENT N/A 09/04/2020   Procedure: TRACHEOSTOMY;  Surgeon: Herminio Miu, MD;  Location: [facility] OR[facility];  Service: ENT;  Laterality: N/A;   TUBAL LIGATION      Current Outpatient Medications  Medication Sig Dispense Refill   acetaminophen  (TYLENOL ) 500 MG tablet Take 500 mg by mouth every 6 (six) hours as needed for headache, fever, moderate pain or mild pain.     albuterol  (PROVENTIL ) (2.5 MG/3ML) 0.083% nebulizer solution Take 3 mLs (2.5 mg total) by nebulization every 6 (six) hours as needed for wheezing or shortness of breath. 360 mL 11   bisoprolol  (ZEBETA ) 5 MG tablet Take 1 tablet (5 mg total) by mouth daily. 30 tablet 6   budesonide  (PULMICORT ) 0.5 MG/2ML nebulizer solution Take 2 mLs (0.5 mg total) by nebulization 2 (two) times daily. 120 mL 11   formoterol  (PERFOROMIST ) 20 MCG/2ML nebulizer solution Take 2 mLs (20 mcg total) by nebulization 2 (two) times daily. Use before the budesonide , twice a day. 120 mL 11   furosemide  (LASIX ) 20 MG tablet Take 1 tablet (20 mg total) by mouth daily as needed for edema (shortness of breath). 90  tablet 3   hydrOXYzine (ATARAX) 10 MG tablet Take 10 mg by mouth 3 (three) times daily.     levothyroxine  (SYNTHROID ) 125 MCG tablet Take 250 mcg by mouth daily.     OXYGEN Inhale into the lungs at bedtime as needed. Up to 4 liters     predniSONE  (DELTASONE ) 20 MG tablet Take 3 tablets (60 mg total) by mouth daily with breakfast for 3 days, THEN 2 tablets (40 mg total) daily with breakfast for 3 days, THEN 1 tablet (20 mg total)  daily with breakfast for 3 days. 18 tablet 0   Current Facility-Administered Medications  Medication Dose Route Frequency Provider Last Rate Last Admin   betamethasone  acetate-betamethasone  sodium phosphate  (CELESTONE ) injection 3 mg  3 mg Intra-articular Once         Allergies:   Patient has no known allergies.   Social History:  The patient  reports that she quit smoking about 25 years ago. Her smoking use included cigarettes. She started smoking about 30 years ago. She has a 2.5 pack-year smoking history. She has never used smokeless tobacco. She reports that she does not drink alcohol and does not use drugs.   Family History:   family history includes Heart disease in her father; Hyperlipidemia in her mother; Hypertension in her mother; Liver cancer in her father.    Review of Systems: Review of Systems  Constitutional: Negative.   HENT: Negative.    Respiratory: Negative.    Cardiovascular: Negative.   Gastrointestinal: Negative.   Musculoskeletal: Negative.   Neurological: Negative.   Psychiatric/Behavioral: Negative.    All other systems reviewed and are negative.   PHYSICAL EXAM: VS:  There were no vitals taken for this visit. , BMI There is no height or weight on file to calculate BMI. Constitutional:  oriented to person, place, and time. No distress.  HENT:  Head: Grossly normal Eyes:  no discharge. No scleral icterus.  Neck: No JVD, no carotid bruits  Cardiovascular: Regular rate and rhythm, no murmurs appreciated Pulmonary/Chest: Clear to auscultation bilaterally, no wheezes or rails Abdominal: Soft.  no distension.  no tenderness.  Musculoskeletal: Normal range of motion Neurological:  normal muscle tone. Coordination normal. No atrophy Skin: Skin warm and dry Psychiatric: normal affect, pleasant  Recent Labs: 11/10/2023: Hemoglobin 12.9; Platelet Count 358 06/05/2024: BUN 7; Creatinine, Ser 0.92; Magnesium 2.3; Potassium 4.0; Sodium 140    Lipid Panel Lab  Results  Component Value Date   TRIG 1,906 (H) 08/17/2020    Wt Readings from Last 3 Encounters:  06/05/24 280 lb (127 kg)  04/18/24 284 lb 12.8 oz (129.2 kg)  10/16/23 262 lb 5.6 oz (119 kg)     ASSESSMENT AND PLAN:  Problem List Items Addressed This Visit     Acute renal failure (ARF) (HCC)   Acute respiratory failure (HCC)   Other Visit Diagnoses       Dilated cardiomyopathy (HCC)    -  Primary     SOB (shortness of breath)         Anemia, unspecified type         Morbid obesity (HCC)         Leg edema         Palpitations          Shortness of breath  Ejection fraction low normal on  echocardiogram May 2023 50% No further workup needed at this time  Anemia Hemoglobin low 9.8 Was hospitalized September 2022 for anemia required transfusion heavy  menses Receiving iron  infusions  Cardiomyopathy Ejection fraction 50% on echocardiogram  Denies ankle swelling, does not take Lasix  as needed as she feels euvolemic  Irregular heartbeat Zio monitor with PVCs, having symptoms Prescription provided for bisoprolol  5 mg daily, she will fill this through [email or web address] [facility] $9 Continupropranolol  as needed  Preop cardiovascular evaluation Reports that she is preparing for trach removal September 2024 Acceptable risk, no further cardiac workup needed    Total encounter time more than 40 minutes  Greater than 50% was spent in counseling and coordination of care with the patient    Signed, Velinda Lunger, M.D., Ph.D. [facility] [place], [place] [phone]

## 2024-06-11 ENCOUNTER — Ambulatory Visit: Admitting: Cardiovascular Disease

## 2024-06-11 DIAGNOSIS — R002 Palpitations: Secondary | ICD-10-CM

## 2024-06-11 DIAGNOSIS — R600 Localized edema: Secondary | ICD-10-CM

## 2024-06-11 DIAGNOSIS — D649 Anemia, unspecified: Secondary | ICD-10-CM

## 2024-06-11 DIAGNOSIS — N170 Acute kidney failure with tubular necrosis: Secondary | ICD-10-CM

## 2024-06-11 DIAGNOSIS — I420 Dilated cardiomyopathy: Secondary | ICD-10-CM

## 2024-06-11 DIAGNOSIS — R0602 Shortness of breath: Secondary | ICD-10-CM

## 2024-06-11 DIAGNOSIS — J9601 Acute respiratory failure with hypoxia: Secondary | ICD-10-CM

## 2024-06-11 LAB — COMP. METABOLIC PANEL (12)
AST: 25 IU/L (ref 0–40)
Albumin: 4.7 g/dL (ref 3.9–4.9)
Alkaline Phosphatase: 118 IU/L (ref 44–121)
BUN/Creatinine Ratio: 8 — ABNORMAL LOW (ref 9–23)
BUN: 7 mg/dL (ref 6–24)
Bilirubin Total: 0.3 mg/dL (ref 0.0–1.2)
Calcium: 9.9 mg/dL (ref 8.7–10.2)
Chloride: 100 mmol/L (ref 96–106)
Creatinine, Ser: 0.92 mg/dL (ref 0.57–1.00)
Globulin, Total: 2.8 g/dL (ref 1.5–4.5)
Glucose: 96 mg/dL (ref 70–99)
Potassium: 4 mmol/L (ref 3.5–5.2)
Sodium: 140 mmol/L (ref 134–144)
Total Protein: 7.5 g/dL (ref 6.0–8.5)
eGFR: 77 mL/min/1.73 (ref >59)

## 2024-06-11 LAB — 25-HYDROXY VITAMIN D LCMS D2+D3
25-Hydroxy, Vitamin D-2: <1 ng/mL
25-Hydroxy, Vitamin D-3: 12 ng/mL
25-Hydroxy, Vitamin D: 12 ng/mL — ABNORMAL LOW

## 2024-06-11 LAB — VITAMIN B12: Vitamin B-12: 398 pg/mL (ref 232–1245)

## 2024-06-11 LAB — C-REACTIVE PROTEIN: CRP: 6 mg/L (ref 0–10)

## 2024-06-11 LAB — MAGNESIUM: Magnesium: 2.3 mg/dL (ref 1.6–2.3)

## 2024-06-11 LAB — SEDIMENTATION RATE: Sed Rate: 25 mm/h (ref 0–32)

## 2024-06-19 ENCOUNTER — Encounter: Payer: Self-pay | Admitting: Pain Medicine

## 2024-06-19 ENCOUNTER — Ambulatory Visit: Attending: Pain Medicine | Admitting: Pain Medicine

## 2024-06-19 VITALS — BP 146/91 | HR 72 | Temp 97.6°F | Resp 16 | Ht 69.0 in | Wt 280.0 lb

## 2024-06-19 DIAGNOSIS — M7989 Other specified soft tissue disorders: Secondary | ICD-10-CM | POA: Insufficient documentation

## 2024-06-19 DIAGNOSIS — Z6841 Body Mass Index (BMI) 40.0 and over, adult: Secondary | ICD-10-CM | POA: Diagnosis not present

## 2024-06-19 DIAGNOSIS — G5751 Tarsal tunnel syndrome, right lower limb: Secondary | ICD-10-CM | POA: Insufficient documentation

## 2024-06-19 DIAGNOSIS — J398 Other specified diseases of upper respiratory tract: Secondary | ICD-10-CM | POA: Diagnosis not present

## 2024-06-19 DIAGNOSIS — M79671 Pain in right foot: Secondary | ICD-10-CM | POA: Insufficient documentation

## 2024-06-19 DIAGNOSIS — G8929 Other chronic pain: Secondary | ICD-10-CM | POA: Insufficient documentation

## 2024-06-19 DIAGNOSIS — E559 Vitamin D deficiency, unspecified: Secondary | ICD-10-CM | POA: Insufficient documentation

## 2024-06-19 DIAGNOSIS — E66813 Obesity, class 3: Secondary | ICD-10-CM | POA: Diagnosis present

## 2024-06-19 MED ORDER — ERGOCALCIFEROL 1.25 MG (50000 UT) PO CAPS: 50000.0000 [IU] | ORAL_CAPSULE | ORAL | 0 refills | Status: AC

## 2024-06-19 MED ORDER — VITAMIN D3 125 MCG (5000 UT) PO CAPS: 5000.0000 [IU] | ORAL_CAPSULE | Freq: Every day | ORAL | 0 refills | Status: AC

## 2024-06-19 NOTE — Progress Notes (Signed)
 Safety precautions to be maintained throughout the outpatient stay will include: orient to surroundings, keep bed in low position, maintain call bell within reach at all times, provide assistance with transfer out of bed and ambulation.

## 2024-06-19 NOTE — Progress Notes (Signed)
 PROVIDER NOTE: Interpretation of information contained herein should be left to medically-trained personnel. Specific patient instructions are provided elsewhere under Patient Instructions section of medical record. This document was created in part using AI and STT-dictation technology, any transcriptional errors that may result from this process are unintentional.  Patient: Kathy Vega  Service: E/M   [facility]P: [facility]  DOB: [birth year]  DOS: 06/19/2024  Provider: Eric DELENA Como, MD  MRN: [id number]  Delivery: Face-to-face  Specialty: Interventional Pain Management  Type: Established Patient  Setting: Ambulatory outpatient facility  Specialty designation: 09  Referring Prov.: [facility]  Location: Outpatient office facility       Primary Reason(s) for Visit: Encounter for evaluation before starting new chronic pain management plan of care (Level of risk: moderate) CC: Foot Pain (Right )  HPI  Kathy Vega is a 48 y.o. year old, female patient, who comes today for a follow-up evaluation to review the test results and decide on a treatment plan. 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 Her primarily concern today is the Foot Pain (Right )  Pain Assessment: Location: Right Foot Radiating: Radaites from right foot uptowrds right knee Onset: More than a month ago Duration: Chronic pain Quality: Aching, Burning, Constant, Tender, Stabbing, Numbness, Discomfort, Sore, Tiring Severity: 8 /10 (subjective, self-reported pain score)  Effect on ADL: prolonged walking, standing worsens pain, putting on shoes or socks, anything that touches foot Timing: Constant Modifying factors: Denies BP: (!) 146/91  HR: 72  Ms. Kurtz comes in today for a follow-up visit after her initial evaluation on 06/05/2024. Today we went over the results of her tests. These were explained in Layman's terms. During today's appointment we went over my diagnostic impression, as well as the proposed treatment plan.  Review of initial evaluation (06/05/2024): Kathy Vega is a 48 year old female who presents with right foot pain.   She has been experiencing pain in her right foot for approximately four months, primarily located on the lateral side. The pain is described as sharp, achy, burning, and itchy, and is constant throughout the day without variation in intensity. The pain began after an IV was placed in her foot during surgery due to difficulty accessing veins in her arms, starting immediately while the IV was in place.   She has attempted various treatments for the foot pain, including a steroid injection directly into the foot, which did not provide relief. She also tried pregabalin (Lyrica) for about a month, taking it twice daily, but it was ineffective. Tylenol  did not alleviate the pain, and an arthritis pain cream worsened it. She experiences numbness in the same area of the foot where the pain is located.   She has a history of tracheal stenosis, having undergone eleven surgeries related to this condition, with the most recent surgery in May of this year.  She is scheduled for  another surgery on August 29th. She developed an infection following her second surgery in May.   No history of kidney or heart problems, diabetes, or allergies.  Review of diagnostic test ordered on 06/05/2024:  Diagnostic lab work: Vitamin D  levels show a deficiency of 12 mg/mL (normal 30-100).  Comprehensive metabolic panel was within normal limits except for a mild decreased BUN/creatinine ratio of 8 (normal 9-23).  C-reactive protein, magnesium, sed rate, and vitamin B12 levels were all within normal limits.  UDS was within normal limits. Diagnostic imaging: Diagnostic x-rays of the right foot show no acute fracture or dislocation.  Diffuse soft tissue swelling.  No evidence of arthropathy or other focal bony abnormality.  Discussed the use of AI scribe software for clinical note transcription with the patient, who gave verbal consent to proceed.  History of Present Illness   Kathy Vega is a 48 year old female who presents with persistent right foot pain following an IV insertion.  She has been experiencing right foot pain for approximately four months, which began after an IV was inserted in her foot due to difficulty finding a vein elsewhere. The pain is described as sharp, aching, burning, and itching, and is localized to the bottom of her foot, including her toes. She also experiences numbness in the same area.  She was previously prescribed Lyrica to be taken twice daily, but found it ineffective. She has also been taking Tylenol  for pain management.  There is no record of a nerve conduction test being performed, and she has not undergone this diagnostic procedure before.  Recent lab work showed a vitamin D  level of 12 mg/ml.      Patient presented with interventional treatment options. Ms. Bermingham was informed that I will not be providing medication management. Pharmacotherapy evaluation including recommendations may be offered, if specifically requested.   Controlled Substance  Pharmacotherapy Assessment REMS (Risk Evaluation and Mitigation Strategy)  Opioid Analgesic: None MME/day: 0 mg/day   Pill Count: None expected due to no prior prescriptions written by our practice. Bonner Norris, RN  06/19/2024  2:49 PM  Sign when Signing Visit Safety precautions to be maintained throughout the outpatient stay will include: orient to surroundings, keep bed in low position, maintain call bell within reach at all times, provide assistance with transfer out of bed and ambulation.     Pharmacokinetics: Liberation and absorption (onset of action): WNL Distribution (time to peak effect): WNL Metabolism and excretion (duration of action): WNL         Pharmacodynamics: Desired effects: Analgesia: Ms. Nunes reports >50% benefit. Functional ability: Patient reports that medication allows her to accomplish basic ADLs Clinically meaningful improvement in function (CMIF): Sustained CMIF goals met Perceived effectiveness: Described as relatively effective, allowing for increase in activities of daily living (ADL) Undesirable effects: Side-effects or Adverse reactions: None reported Monitoring: [place] PMP: PDMP reviewed during this encounter. Online review of the past 70-month period previously conducted. Not applicable at this point since we have not taken over the patient's medication management yet. List of other Serum/Urine Drug Screening Test(s):  Lab Results  Component Value Date   ETH <10 08/14/2020   List of all UDS test(s) done:  Lab Results  Component Value Date   SUMMARY FINAL 06/05/2024   Last UDS on record: Summary  Date Value Ref Range Status  06/05/2024 FINAL  Final    Comment:    ==================================================================== Compliance Drug Analysis, Ur ==================================================================== Test  Result       Flag       Units  Drug Present and Declared for Prescription  Verification   Acetaminophen                   PRESENT      EXPECTED  Drug Absent but Declared for Prescription Verification   Hydroxyzine                    Not Detected UNEXPECTED ==================================================================== Test                      Result    Flag   Units      Ref Range   Creatinine              122              mg/dL      >=79 ==================================================================== Declared Medications:  The flagging and interpretation on this report are based on the  following declared medications.  Unexpected results may arise from  inaccuracies in the declared medications.   **Note: The testing scope of this panel includes these medications:   Hydroxyzine (Atarax)   **Note: The testing scope of this panel does not include small to  moderate amounts of these reported medications:   Acetaminophen  (Tylenol )   **Note: The testing scope of this panel does not include the  following reported medications:   Albuterol  (Proventil  HFA)  Bisoprolol  (Zebeta )  Budesonide  (Pulmicort )  Formoterol  (Perforomist )  Furosemide  (Lasix )  Helium  Levothyroxine  (Synthroid )  Oxygen  Prednisone  (Deltasone ) ==================================================================== For clinical consultation, please call [phone]. ====================================================================    UDS interpretation: No unexpected findings.          Medication Assessment Form: Not applicable. No opioids. Treatment compliance: Not applicable Risk Assessment Profile: Aberrant behavior: See initial evaluations. None observed or detected today Comorbid factors increasing risk of overdose: See initial evaluation. No additional risks detected today Opioid risk tool (ORT):     06/05/2024   11:47 AM  Opioid Risk   Alcohol 0  Illegal Drugs 0  Rx Drugs 0  Age between 16-45 years  0  History of Preadolescent Sexual Abuse 0  Psychological  Disease 0  Depression 0  Opioid Risk Tool Scoring 0  Opioid Risk Interpretation Low Risk    ORT Scoring interpretation table:  Score <3 = Low Risk for SUD  Score between 4-7 = Moderate Risk for SUD  Score >8 = High Risk for Opioid Abuse   Risk of substance use disorder (SUD): Low  Risk Mitigation Strategies:  Patient opioid safety counseling: No controlled substances prescribed. Patient-Prescriber Agreement (PPA): No agreement signed.  Controlled substance notification to other providers: None required. No opioid therapy.  Pharmacologic Plan: Non-opioid analgesic therapy offered. Interventional alternatives discussed.             Laboratory Chemistry Profile   Renal Lab Results  Component Value Date   BUN 7 06/05/2024   CREATININE 0.92 06/05/2024   BCR 8 (L) 06/05/2024   GFRAA >60 09/01/2020   GFRNONAA >60 11/10/2023   PROTEINUR NEGATIVE 10/06/2023     Electrolytes Lab Results  Component Value Date   NA 140 06/05/2024   K 4.0 06/05/2024   CL 100 06/05/2024   CALCIUM 9.9 06/05/2024   MG 2.3 06/05/2024   PHOS 3.9 07/18/2022     Hepatic Lab Results  Component Value Date   AST 25 06/05/2024   ALT 26  08/14/2022   ALBUMIN 4.7 06/05/2024   ALKPHOS 118 06/05/2024   LIPASE 22 08/30/2016     ID Lab Results  Component Value Date   HIV Non Reactive 07/18/2022   SARSCOV2NAA NEGATIVE 04/27/2022   MRSAPCR NEGATIVE 09/22/2020   HCVAB NON REACTIVE 08/13/2020   PREGTESTUR NEGATIVE 10/06/2023     Bone Lab Results  Component Value Date   25OHVITD1 12 (L) 06/05/2024   25OHVITD2 <1.0 06/05/2024   25OHVITD3 12 06/05/2024     Endocrine Lab Results  Component Value Date   GLUCOSE 96 06/05/2024   GLUCOSEU NEGATIVE 10/06/2023   TSH 167.262 (H) 07/16/2022   FREET4 0.29 (L) 05/12/2022   CRTSLPL 5.0 05/12/2022     Neuropathy Lab Results  Component Value Date   VITAMINB12 398 06/05/2024   FOLATE 15.1 07/16/2022   HIV Non Reactive 07/18/2022     CNS No results  found for: COLORCSF, APPEARCSF, RBCCOUNTCSF, WBCCSF, POLYSCSF, LYMPHSCSF, EOSCSF, PROTEINCSF, GLUCCSF, JCVIRUS, CSFOLI, IGGCSF, LABACHR, ACETBL   Inflammation (CRP: Acute  ESR: Chronic) Lab Results  Component Value Date   CRP 6 06/05/2024   ESRSEDRATE 25 06/05/2024   LATICACIDVEN 1.9 07/18/2022     Rheumatology Lab Results  Component Value Date   ANA Negative 07/29/2021     Coagulation Lab Results  Component Value Date   INR 1.1 08/14/2022   LABPROT 13.6 08/14/2022   PLT 358 11/10/2023   DDIMER 0.27 04/01/2022     Cardiovascular Lab Results  Component Value Date   BNP 12.3 04/27/2022   TROPONINI <0.03 04/03/2017   HGB 12.9 11/10/2023   HCT 39.1 11/10/2023     Screening Lab Results  Component Value Date   SARSCOV2NAA NEGATIVE 04/27/2022   MRSAPCR NEGATIVE 09/22/2020   HCVAB NON REACTIVE 08/13/2020   HIV Non Reactive 07/18/2022   PREGTESTUR NEGATIVE 10/06/2023     Cancer No results found for: CEA, CA125, LABCA2   Allergens No results found for: ALMOND, APPLE, ASPARAGUS, AVOCADO, BANANA, BARLEY, BASIL, BAYLEAF, GREENBEAN, LIMABEAN, WHITEBEAN, BEEFIGE, REDBEET, BLUEBERRY, BROCCOLI, CABBAGE, MELON, CARROT, CASEIN, CASHEWNUT, CAULIFLOWER, CELERY     Note: Lab results reviewed.  Recent Diagnostic Imaging Review  Lumbosacral Imaging: Lumbar CT wo contrast: Results for orders placed during the hospital encounter of 03/30/23 CT Lumbar Spine Wo Contrast  Narrative CLINICAL DATA:  Low back pain, cauda equina syndrome suspected Sudden sharp lower back pain after lifting item a week ago  EXAM: CT LUMBAR SPINE WITHOUT CONTRAST  TECHNIQUE: Multidetector CT imaging of the lumbar spine was performed without intravenous contrast administration. Multiplanar CT image reconstructions were also generated.  RADIATION DOSE REDUCTION: This exam was performed according to the departmental  dose-optimization program which includes automated exposure control, adjustment of the mA and/or kV according to patient size and/or use of iterative reconstruction technique.  COMPARISON:  None Available.  FINDINGS: Segmentation: 5 lumbar type vertebrae.  Alignment: Normal.  Vertebrae: No acute fracture or focal pathologic process.  Paraspinal and other soft tissues: Negative.  Disc levels: Maintained. No disc herniation. No central canal stenosis or neural foraminal narrowing.  IMPRESSION: Unremarkable study.   Electronically Signed By: Franky Crease M.D. On: 03/30/2023 15:51  Knee Imaging: Knee-R DG 4 views: Results for orders placed during the hospital encounter of 11/10/15 DG Knee Complete 4 Views Right  Narrative CLINICAL DATA:  Right knee pain for 2 weeks.  EXAM: RIGHT KNEE - COMPLETE 4+ VIEW  COMPARISON:  None.  FINDINGS: The patella is high riding. No evidence for a patellar fracture.  Mild spurring and degenerative changes at the patellofemoral compartment. No evidence for acute fracture, dislocation or large joint effusion.  IMPRESSION: No acute bone abnormality.  Patella Alta.   Electronically Signed By: Juliene Balder M.D. On: 11/10/2015 09:28  Foot Imaging: Foot-R DG Complete: Results for orders placed during the hospital encounter of 06/05/24 DG Foot Complete Right  Narrative CLINICAL DATA:  Pain  EXAM: RIGHT FOOT COMPLETE - 3+ VIEW  COMPARISON:  Right foot x-ray 04/16/2024  FINDINGS: There is no evidence of fracture or dislocation. There is no evidence of arthropathy or other focal bone abnormality. There is diffuse soft tissue swelling.  IMPRESSION: 1. No acute fracture or dislocation. 2. Diffuse soft tissue swelling.   Electronically Signed By: Greig Pique M.D. On: 06/07/2024 22:46  Elbow Imaging: Elbow-L DG Complete: Results for orders placed during the hospital encounter of 05/05/15 DG Elbow Complete  Left  Narrative CLINICAL DATA:  Left elbow pain for 2 months, no known injury, initial encounter  EXAM: LEFT ELBOW - COMPLETE 3+ VIEW  COMPARISON:  None.  FINDINGS: There is no evidence of fracture, dislocation, or joint effusion. There is no evidence of arthropathy or other focal bone abnormality. Soft tissues are unremarkable.  IMPRESSION: No acute abnormality noted.   Electronically Signed By: Oneil Devonshire M.D. On: 05/05/2015 12:02  Complexity Note: Imaging results reviewed.                         Meds   Current Outpatient Medications:    acetaminophen  (TYLENOL ) 500 MG tablet, Take 500 mg by mouth every 6 (six) hours as needed for headache, fever, moderate pain or mild pain., Disp: , Rfl:    albuterol  (PROVENTIL ) (2.5 MG/3ML) 0.083% nebulizer solution, Take 3 mLs (2.5 mg total) by nebulization every 6 (six) hours as needed for wheezing or shortness of breath., Disp: 360 mL, Rfl: 11   bisoprolol  (ZEBETA ) 5 MG tablet, Take 1 tablet (5 mg total) by mouth daily., Disp: 30 tablet, Rfl: 6   budesonide  (PULMICORT ) 0.5 MG/2ML nebulizer solution, Take 2 mLs (0.5 mg total) by nebulization 2 (two) times daily., Disp: 120 mL, Rfl: 11   Cholecalciferol (VITAMIN D3) 125 MCG (5000 UT) CAPS, Take 1 capsule (5,000 Units total) by mouth daily with breakfast. Take along with calcium and magnesium., Disp: 90 capsule, Rfl: 0   [START ON 06/20/2024] ergocalciferol  (VITAMIN D2) 1.25 MG (50000 UT) capsule, Take 1 capsule (50,000 Units total) by mouth 2 (two) times a week. X 6 weeks., Disp: 12 capsule, Rfl: 0   formoterol  (PERFOROMIST ) 20 MCG/2ML nebulizer solution, Take 2 mLs (20 mcg total) by nebulization 2 (two) times daily. Use before the budesonide , twice a day., Disp: 120 mL, Rfl: 11   furosemide  (LASIX ) 20 MG tablet, Take 1 tablet (20 mg total) by mouth daily as needed for edema (shortness of breath)., Disp: 90 tablet, Rfl: 3   hydrOXYzine (ATARAX) 10 MG tablet, Take 10 mg by mouth 3 (three)  times daily., Disp: , Rfl:    levothyroxine  (SYNTHROID ) 125 MCG tablet, Take 250 mcg by mouth daily., Disp: , Rfl:    OXYGEN, Inhale into the lungs at bedtime as needed. Up to 4 liters, Disp: , Rfl:    pregabalin (LYRICA) 50 MG capsule, Take 50 mg by mouth., Disp: , Rfl:   Current Facility-Administered Medications:    betamethasone  acetate-betamethasone  sodium phosphate  (CELESTONE ) injection 3 mg, 3 mg, Intra-articular, Once,   ROS  Constitutional: Denies any fever  or chills Gastrointestinal: No reported hemesis, hematochezia, vomiting, or acute GI distress Musculoskeletal: Denies any acute onset joint swelling, redness, loss of ROM, or weakness Neurological: No reported episodes of acute onset apraxia, aphasia, dysarthria, agnosia, amnesia, paralysis, loss of coordination, or loss of consciousness  Allergies  Ms. Suen has no known allergies.  PFSH  Drug: Ms. Pociask  reports no history of drug use. Alcohol:  reports no history of alcohol use. Tobacco:  reports that she quit smoking about 25 years ago. Her smoking use included cigarettes. She started smoking about 30 years ago. She has a 2.5 pack-year smoking history. She has never used smokeless tobacco. Medical:  has a past medical history of Acute encephalopathy (08/13/2020), Acute renal failure (ARF) (HCC) (08/13/2020), Anemia, Asthma, Cervical cancer (HCC), Hypothyroidism, Iron  deficiency anemia, Obesity (BMI 30-39.9), Pneumonia due to COVID-19 virus (08/13/2020), Subglottic stenosis, Tracheal stenosis, and Tracheostomy tube present (HCC). Surgical: Ms. Ditmer  has a past surgical history that includes Leg Surgery; Cholecystectomy; Tracheostomy tube placement (N/A, 09/04/2020); Cervical biopsy w/ loop electrode excision; Cesarean section; Cardiac catheterization; Laryngoscopy (04/16/2021); Laryngoscopy (02/25/2022); Laryngoscopy (08/13/2021); and Tubal ligation. Family: family history includes Heart disease in her father; Hyperlipidemia in her  mother; Hypertension in her mother; Liver cancer in her father.  Constitutional Exam  General appearance: Well nourished, well developed, and well hydrated. In no apparent acute distress Vitals:   06/19/24 1448  BP: (!) 146/91  Pulse: 72  Resp: 16  Temp: 97.6 F (36.4 C)  TempSrc: Temporal  SpO2: 100%  Weight: 280 lb (127 kg)  Height: 5' 9 (1.753 m)   BMI Assessment: Estimated body mass index is 41.35 kg/m as calculated from the following:   Height as of this encounter: 5' 9 (1.753 m).   Weight as of this encounter: 280 lb (127 kg).  BMI interpretation table: BMI level Category Range association with higher incidence of chronic pain  <18 kg/m2 Underweight   18.5-24.9 kg/m2 Ideal body weight   25-29.9 kg/m2 Overweight Increased incidence by 20%  30-34.9 kg/m2 Obese (Class I) Increased incidence by 68%  35-39.9 kg/m2 Severe obesity (Class II) Increased incidence by 136%  >40 kg/m2 Extreme obesity (Class III) Increased incidence by 254%   Patient's current BMI Ideal Body weight  Body mass index is 41.35 kg/m. Ideal body weight: 66.2 kg (145 lb 15.1 oz) Adjusted ideal body weight: 90.5 kg (199 lb 9.1 oz)   BMI Readings from Last 4 Encounters:  06/19/24 41.35 kg/m  06/05/24 41.35 kg/m  04/18/24 42.06 kg/m  10/16/23 38.74 kg/m   Wt Readings from Last 4 Encounters:  06/19/24 280 lb (127 kg)  06/05/24 280 lb (127 kg)  04/18/24 284 lb 12.8 oz (129.2 kg)  10/16/23 262 lb 5.6 oz (119 kg)  Psych/Mental status: Alert, oriented x 3 (person, place, & time)       Eyes: PERLA Respiratory: No evidence of acute respiratory distress   Assessment & Plan  Primary Diagnosis & Pertinent Problem List: The primary encounter diagnosis was Chronic foot pain (Right). Diagnoses of Swelling of foot (Right), Tarsal tunnel syndrome (Right), Tracheal stenosis, Obesity, Class III, BMI 40-49.9 (morbid obesity), and Vitamin D  deficiency were also pertinent to this visit. Visit Diagnosis: 1.  Chronic foot pain (Right)   2. Swelling of foot (Right)   3. Tarsal tunnel syndrome (Right)   4. Tracheal stenosis   5. Obesity, Class III, BMI 40-49.9 (morbid obesity)   6. Vitamin D  deficiency    Problems updated and reviewed during this  visit: Problem  Swelling of foot (Right)  Tarsal tunnel syndrome (Right)   The patient is experiencing burning, aching, and shooting pains around the arch of the foot and the plantar aspect of her right foot, suspicious for a tarsal tunnel syndrome.   Vitamin D  Deficiency    Plan of Care  Assessment and Plan    Foot pain and numbness   She has experienced chronic right foot pain and numbness for over four months, described as sharp, aching, burning, and itching, localized to the bottom of the foot and toes. Symptoms began after an IV was placed in the foot. The differential diagnosis includes tarsal tunnel syndrome, involving entrapment of a nerve under a ligament in the foot. Refer to a neurologist for a nerve conduction test to confirm the diagnosis. If tarsal tunnel syndrome is confirmed, consider referral for nerve release surgery.  Vitamin D  deficiency   She has severe vitamin D  deficiency with a level of 12 mg/ml, significantly below the normal range of 30-100 mg/ml. Vitamin D  is crucial for bone health, blood sugar control, and pain perception, and deficiency may exacerbate pain perception. Prescribe vitamin D2 at 50,000 IU twice a week for 6 weeks. Advise taking vitamin D3 at 5,000 IU daily for life after initial treatment. Instruct her primary care physician to monitor vitamin D  levels regularly. Send the prescription to [facility] pharmacy in [place], [employer].        Pharmacotherapy (Medications Ordered): Meds ordered this encounter  Medications   ergocalciferol  (VITAMIN D2) 1.25 MG (50000 UT) capsule    Sig: Take 1 capsule (50,000 Units total) by mouth 2 (two) times a week. X 6 weeks.    Dispense:  12 capsule    Refill:  0     Fill one day early if pharmacy is closed on scheduled refill date. May substitute for generic, or similar, if available.   Cholecalciferol (VITAMIN D3) 125 MCG (5000 UT) CAPS    Sig: Take 1 capsule (5,000 Units total) by mouth daily with breakfast. Take along with calcium and magnesium.    Dispense:  90 capsule    Refill:  0    Fill 1 day early if pharmacy is closed on scheduled refill date. Generic permitted. Do not send renewal requests.   Procedure Orders    No procedure(s) ordered today   Orders Placed This Encounter  Procedures   NCV with EMG(electromyography)    Indication(s): Other Vit deficiency (E56.8), chronic right foot pain and numbness.    Standing Status:   Future    Expiration Date:   09/19/2024    Scheduling Instructions:     Please refer this patient to [facility] for Nerve Conduction testing of the right lower extremity. (EMG & PNCV)    Where should this test be performed?:   other    Location on body:   Lower extremity    Laterality:   Bilateral             Always perform bilateral testing for comparison purposes.    Clinical Indication:   Chronic right foot pain and numbness after IV access.    Release to patient:   Immediate   Lab Orders  No laboratory test(s) ordered today   Imaging Orders  No imaging studies ordered today   Referral Orders  No referral(s) requested today    Pharmacological management:  Opioid Analgesics: I will not be prescribing any opioids at this time Membrane stabilizer: I will not be  prescribing any at this time Muscle relaxant: I will not be prescribing any at this time NSAID: I will not be prescribing any at this time Other analgesic(s): I will not be prescribing any at this time      Interventional Therapies  Risk Factors  Considerations  Medical Comorbidities:  MO (BMI>40)  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   Under consideration:   Pending   Completed: (Analgesic benefit)1  None at this time   Therapeutic  Palliative (PRN) options:   None established   Completed by other providers:   None reported  1(Analgesic benefit): Expressed in percentage (%). (Local anesthetic[LA] +/- sedation  L.A.Local Anesthetic  Steroid benefit  Ongoing benefit)      Provider-requested follow-up: Return for Eval-day (M,W), (F2F) after nerve conduction test. Recent Visits Date Type Provider Dept  06/05/24 Office Visit Tanya Glisson, MD [facility]  Showing recent visits within past 90 days and meeting all other requirements Today's Visits Date Type Provider Dept  06/19/24 Office Visit Tanya Glisson, MD [facility]  Showing today's visits and meeting all other requirements Future Appointments No visits were found meeting these conditions. Showing future appointments within next 90 days and meeting all other requirements   Primary Care Physician: Center, Carlin Blamer [facility]  Duration of encounter: 49 minutes.  Total time on encounter, as per AMA guidelines included both the face-to-face and non-face-to-face time personally spent by the physician and/or other qualified health care professional(s) on the day of the encounter (includes time in activities that require the physician or other qualified health care professional and does not include time in activities normally performed by clinical staff). Physician's time may include the following activities when performed: Preparing to see the patient (e.g., pre-charting review of records, searching for previously ordered imaging, lab work, and nerve conduction tests) Review of prior analgesic pharmacotherapies. Reviewing PMP Interpreting ordered tests (e.g., lab work, imaging, nerve conduction tests) Performing post-procedure evaluations, including  interpretation of diagnostic procedures Obtaining and/or reviewing separately obtained history Performing a medically appropriate examination and/or evaluation Counseling and educating the patient/family/caregiver Ordering medications, tests, or procedures Referring and communicating with other health care professionals (when not separately reported) Documenting clinical information in the electronic or other health record Independently interpreting results (not separately reported) and communicating results to the patient/ family/caregiver Care coordination (not separately reported)  Note by: Glisson DELENA Tanya, MD (TTS technology used. I apologize for any typographical errors that were not detected and corrected.) Date: 06/19/2024; Time: 4:03 PM

## 2024-06-20 ENCOUNTER — Inpatient Hospital Stay

## 2024-06-20 ENCOUNTER — Inpatient Hospital Stay: Admitting: Nurse Practitioner

## 2024-06-25 ENCOUNTER — Other Ambulatory Visit: Payer: Self-pay | Admitting: Obstetrics and Gynecology

## 2024-06-25 DIAGNOSIS — D219 Benign neoplasm of connective and other soft tissue, unspecified: Secondary | ICD-10-CM

## 2024-06-25 DIAGNOSIS — N920 Excessive and frequent menstruation with regular cycle: Secondary | ICD-10-CM

## 2024-07-08 ENCOUNTER — Inpatient Hospital Stay

## 2024-07-08 ENCOUNTER — Inpatient Hospital Stay: Admitting: Nurse Practitioner

## 2024-07-11 ENCOUNTER — Ambulatory Visit: Admitting: Cardiology

## 2024-07-18 ENCOUNTER — Ambulatory Visit: Admitting: Pulmonary Disease

## 2024-07-22 ENCOUNTER — Encounter: Payer: Self-pay | Admitting: Nurse Practitioner

## 2024-07-22 ENCOUNTER — Inpatient Hospital Stay (HOSPITAL_BASED_OUTPATIENT_CLINIC_OR_DEPARTMENT_OTHER): Admitting: Nurse Practitioner

## 2024-07-22 ENCOUNTER — Inpatient Hospital Stay

## 2024-07-22 ENCOUNTER — Inpatient Hospital Stay: Attending: Internal Medicine

## 2024-07-22 VITALS — BP 142/89 | HR 71 | Wt 283.0 lb

## 2024-07-22 DIAGNOSIS — Z87891 Personal history of nicotine dependence: Secondary | ICD-10-CM | POA: Diagnosis not present

## 2024-07-22 DIAGNOSIS — R7303 Prediabetes: Secondary | ICD-10-CM | POA: Insufficient documentation

## 2024-07-22 DIAGNOSIS — Z800 Family history of malignant neoplasm of digestive organs: Secondary | ICD-10-CM | POA: Insufficient documentation

## 2024-07-22 DIAGNOSIS — E611 Iron deficiency: Secondary | ICD-10-CM

## 2024-07-22 DIAGNOSIS — D509 Iron deficiency anemia, unspecified: Secondary | ICD-10-CM | POA: Insufficient documentation

## 2024-07-22 DIAGNOSIS — J9690 Respiratory failure, unspecified, unspecified whether with hypoxia or hypercapnia: Secondary | ICD-10-CM | POA: Insufficient documentation

## 2024-07-22 DIAGNOSIS — N920 Excessive and frequent menstruation with regular cycle: Secondary | ICD-10-CM | POA: Diagnosis not present

## 2024-07-22 LAB — CBC WITH DIFFERENTIAL (CANCER CENTER ONLY)
Abs Immature Granulocytes: 0.02 K/uL (ref 0.00–0.07)
Basophils Absolute: 0.1 K/uL (ref 0.0–0.1)
Basophils Relative: 1 %
Eosinophils Absolute: 0.1 K/uL (ref 0.0–0.5)
Eosinophils Relative: 1 %
HCT: 38.6 % (ref 36.0–46.0)
Hemoglobin: 12.4 g/dL (ref 12.0–15.0)
Immature Granulocytes: 0 %
Lymphocytes Relative: 28 %
Lymphs Abs: 1.9 K/uL (ref 0.7–4.0)
MCH: 26.3 pg (ref 26.0–34.0)
MCHC: 32.1 g/dL (ref 30.0–36.0)
MCV: 81.8 fL (ref 80.0–100.0)
Monocytes Absolute: 0.5 K/uL (ref 0.1–1.0)
Monocytes Relative: 8 %
Neutro Abs: 4.3 K/uL (ref 1.7–7.7)
Neutrophils Relative %: 62 %
Platelet Count: 276 K/uL (ref 150–400)
RBC: 4.72 MIL/uL (ref 3.87–5.11)
RDW: 14.7 % (ref 11.5–15.5)
WBC Count: 6.9 K/uL (ref 4.0–10.5)
nRBC: 0 % (ref 0.0–0.2)

## 2024-07-22 LAB — IRON AND TIBC
Iron: 74 ug/dL (ref 28–170)
Saturation Ratios: 18 % (ref 10.4–31.8)
TIBC: 409 ug/dL (ref 250–450)
UIBC: 335 ug/dL

## 2024-07-22 LAB — BASIC METABOLIC PANEL - CANCER CENTER ONLY
Anion gap: 9 (ref 5–15)
BUN: 10 mg/dL (ref 6–20)
CO2: 27 mmol/L (ref 22–32)
Calcium: 9.9 mg/dL (ref 8.9–10.3)
Chloride: 99 mmol/L (ref 98–111)
Creatinine: 0.88 mg/dL (ref 0.44–1.00)
GFR, Estimated: >60 mL/min (ref >60)
Glucose, Bld: 89 mg/dL (ref 70–99)
Potassium: 3.8 mmol/L (ref 3.5–5.1)
Sodium: 135 mmol/L (ref 135–145)

## 2024-07-22 LAB — FERRITIN: Ferritin: 70 ng/mL (ref 11–307)

## 2024-07-22 NOTE — Progress Notes (Signed)
 [facility] CONSULT NOTE  Patient Care Team: Center, [facility] as PCP - General (General Practice) Tamea Dedra CROME, MD as Consulting Physician (Pulmonary Disease) Rennie Cindy SAUNDERS, MD as Consulting Physician (Oncology)  CHIEF COMPLAINTS/PURPOSE OF CONSULTATION: ANEMIA  HEMATOLOGY HISTORY  # Chronic iron  deficiency anemia - [Hb 7-8; nadir 4.8]; iron  sat;- 2%; GFR- WNL CT/US - ;  EGD/colonoscopy- NONE  # Hx severe covid disease in 2021 requiring trach and prolonged hospitalization, now still with tracheostomy on nighttime and as needed O2 up to 4 L  HISTORY OF PRESENTING ILLNESS: Ambulating independently. Accompanied by family.  Kathy Vega 48 y.o. female pleasant patient with history of severe covid, requiring trach and prolonged hospitalization, in 2021, with chronic tracheostomy, on nighttime and as needed O2, up to 4L, complicated with subglottal stenosis with plans for surgery with [facility] later this year, who returns to clinic for follow up. No longer having heavy menses. Feels better. Denies any neurologic complaints. Denies recent fevers or illnesses. Denies any easy bleeding or bruising. No melena or hematochezia. No pica or restless leg. Reports good appetite and denies weight loss. Denies chest pain. Denies any nausea, vomiting, constipation, or diarrhea. Denies urinary complaints. Patient offers no further specific complaints today.  Review of Systems  Constitutional:  Positive for malaise/fatigue. Negative for chills, diaphoresis, fever and weight loss.  HENT:  Negative for nosebleeds and sore throat.   Eyes:  Negative for double vision.  Respiratory:  Negative for cough, hemoptysis, sputum production, shortness of breath and wheezing.   Cardiovascular:  Negative for chest pain, palpitations, orthopnea and leg swelling.  Gastrointestinal:  Negative for abdominal pain, blood in stool, constipation, diarrhea, heartburn, melena, nausea and  vomiting.  Genitourinary:  Negative for dysuria, frequency and urgency.  Musculoskeletal:  Positive for back pain and joint pain.  Skin: Negative.  Negative for itching and rash.  Neurological:  Negative for dizziness, tingling, focal weakness, weakness and headaches.  Endo/Heme/Allergies:  Does not bruise/bleed easily.  Psychiatric/Behavioral:  Negative for depression. The patient is not nervous/anxious and does not have insomnia.    MEDICAL HISTORY:  Past Medical History:  Diagnosis Date   Acute encephalopathy 08/13/2020   Acute renal failure (ARF) (HCC) 08/13/2020   Anemia    Asthma    Cervical cancer (HCC)    Hypothyroidism    Iron  deficiency anemia    Obesity (BMI 30-39.9)    Pneumonia due to COVID-19 virus 08/13/2020   requiring trach and prolonged hospitalization   Subglottic stenosis    Tracheal stenosis    Tracheostomy tube present [facility])    SURGICAL HISTORY: Past Surgical History:  Procedure Laterality Date   CARDIAC CATHETERIZATION     CERVICAL BIOPSY  W/ LOOP ELECTRODE EXCISION     CESAREAN SECTION     CHOLECYSTECTOMY     LARYNGOSCOPY  04/16/2021   LARYNGOSCOPY  02/25/2022   LARYNGOSCOPY  08/13/2021   LEG SURGERY     TRACHEOSTOMY TUBE PLACEMENT N/A 09/04/2020   Procedure: TRACHEOSTOMY;  Surgeon: Herminio Miu, MD;  Location: [facility] ORS;  Service: ENT;  Laterality: N/A;   TUBAL LIGATION     SOCIAL HISTORY: Social History   Socioeconomic History   Marital status: Married    Spouse name: Ellaree   Number of children: 3   Years of education: Not on file   Highest education level: Not on file  Occupational History   Not on file  Tobacco Use   Smoking status: Former  Current packs/day: 0.00    Average packs/day: 0.5 packs/day for 5.0 years (2.5 ttl pk-yrs)    Types: Cigarettes    Start date: 93    Quit date: 2000    Years since quitting: 25.6   Smokeless tobacco: Never  Vaping Use   Vaping status: Never Used  Substance and Sexual Activity    Alcohol use: No   Drug use: Never   Sexual activity: Yes    Birth control/protection: Surgical    Comment: BTL  Other Topics Concern   Not on file  Social History Narrative   Not on file   Social Drivers of Health   Financial Resource Strain: Low Risk  (03/12/2024)   Received from [facility]   Overall Financial Resource Strain (CARDIA)    Difficulty of Paying Living Expenses: Not hard at all  Food Insecurity: No Food Insecurity (03/12/2024)   Received from [facility]   Hunger Vital Sign    Within the past 12 months, you worried that your food would run out before you got the money to buy more.: Never true    Within the past 12 months, the food you bought just didn't last and you didn't have money to get more.: Never true  Transportation Needs: No Transportation Needs (03/12/2024)   Received from [facility]   PRAPARE - Transportation    Lack of Transportation (Medical): No    Lack of Transportation (Non-Medical): No  Physical Activity: Not on file  Stress: Not on file  Social Connections: Not on file  Intimate Partner Violence: Not At Risk (03/06/2024)   Received from [facility]   Humiliation, Afraid, Rape, and Kick questionnaire    Within the last year, have you been afraid of your partner or ex-partner?: No    Within the last year, have you been humiliated or emotionally abused in other ways by your partner or ex-partner?: No    Within the last year, have you been kicked, hit, slapped, or otherwise physically hurt by your partner or ex-partner?: No    Within the last year, have you been raped or forced to have any kind of sexual activity by your partner or ex-partner?: No   FAMILY HISTORY: Family History  Problem Relation Age of Onset   Hyperlipidemia Mother    Hypertension Mother    Heart disease Father    Liver cancer Father    Breast cancer Neg Hx    Ovarian cancer Neg Hx    ALLERGIES:  has no known allergies.  MEDICATIONS:  Current Outpatient  Medications  Medication Sig Dispense Refill   acetaminophen  (TYLENOL ) 500 MG tablet Take 500 mg by mouth every 6 (six) hours as needed for headache, fever, moderate pain or mild pain.     albuterol  (PROVENTIL ) (2.5 MG/3ML) 0.083% nebulizer solution Take 3 mLs (2.5 mg total) by nebulization every 6 (six) hours as needed for wheezing or shortness of breath. 360 mL 11   bisoprolol  (ZEBETA ) 5 MG tablet Take 1 tablet (5 mg total) by mouth daily. 30 tablet 6   budesonide  (PULMICORT ) 0.5 MG/2ML nebulizer solution Take 2 mLs (0.5 mg total) by nebulization 2 (two) times daily. 120 mL 11   Cholecalciferol (VITAMIN D3) 125 MCG (5000 UT) CAPS Take 1 capsule (5,000 Units total) by mouth daily with breakfast. Take along with calcium and magnesium. 90 capsule 0   ergocalciferol  (VITAMIN D2) 1.25 MG (50000 UT) capsule Take 1 capsule (50,000 Units total) by mouth 2 (two) times  a week. X 6 weeks. 12 capsule 0   formoterol  (PERFOROMIST ) 20 MCG/2ML nebulizer solution Take 2 mLs (20 mcg total) by nebulization 2 (two) times daily. Use before the budesonide , twice a day. 120 mL 11   furosemide  (LASIX ) 20 MG tablet Take 1 tablet (20 mg total) by mouth daily as needed for edema (shortness of breath). 90 tablet 3   hydrOXYzine (ATARAX) 10 MG tablet Take 10 mg by mouth 3 (three) times daily.     levothyroxine  (SYNTHROID ) 125 MCG tablet Take 250 mcg by mouth daily.     OXYGEN Inhale into the lungs at bedtime as needed. Up to 4 liters     pregabalin (LYRICA) 50 MG capsule Take 50 mg by mouth.     Current Facility-Administered Medications  Medication Dose Route Frequency Provider Last Rate Last Admin   betamethasone  acetate-betamethasone  sodium phosphate  (CELESTONE ) injection 3 mg  3 mg Intra-articular Once         PHYSICAL EXAMINATION: Vitals:   07/22/24 1433  BP: (!) 142/89  Pulse: 71  SpO2: 99%   Filed Weights   07/22/24 1431  Weight: 283 lb (128.4 kg)   Physical Exam Vitals reviewed.  Constitutional:       Appearance: She is not ill-appearing.  Cardiovascular:     Rate and Rhythm: Normal rate and regular rhythm.  Pulmonary:     Comments: Trach; room air. Abdominal:     General: There is no distension.  Musculoskeletal:        General: No deformity.  Skin:    Coloration: Skin is not pale.  Neurological:     Mental Status: She is alert and oriented to person, place, and time.  Psychiatric:        Mood and Affect: Mood normal.        Behavior: Behavior normal.     LABORATORY DATA:  I have reviewed the data as listed Lab Results  Component Value Date   WBC 6.9 07/22/2024   HGB 12.4 07/22/2024   HCT 38.6 07/22/2024   MCV 81.8 07/22/2024   PLT 276 07/22/2024   Recent Labs    10/06/23 1139 11/10/23 1230 06/05/24 1219 07/22/24 1411  NA 136 138 140 135  K 4.0 4.2 4.0 3.8  CL 100 104 100 99  CO2 27 25  --  27  GLUCOSE 107* 107* 96 89  BUN 20 15 7 10   CREATININE 0.80 0.73 0.92 0.88  CALCIUM 9.7 10.1 9.9 9.9  GFRNONAA >60 >60  --  >60  PROT  --   --  7.5  --   ALBUMIN  --   --  4.7  --   AST  --   --  25  --   ALKPHOS  --   --  118  --   BILITOT  --   --  0.3  --       Component Value Date/Time   IRON  74 07/22/2024 1411   TIBC 409 07/22/2024 1411   FERRITIN 70 07/22/2024 1411   IRONPCTSAT 18 07/22/2024 1411   No results found.  ASSESSMENT & PLAN:   # Iron  deficiency- Chronic severe anemia- Hb- 7-8; iron  saturation 2% [AUG 2023]-very symptomatic.  Likely secondary to menorrhagia; currently improved..    # Today Hb 12.4. Ferritin 70, iron  sat 18%. HOLD venofer  today. Continue gnetle iron - tolerating well. 1 pill daily.   # Etiology- Likely secondary to menorrhagia. S/p Dr. Janit gynecology. Improved with myfembree  but needs f/u appt. She still  needs colonoscopy for screening. Recommend she discuss with PCP.    # Respiratory failure- COVID 2021 currently s/p trach. Followed by otolaryngology, Dr Junie.   # PN- G-1-2 jose and feet]- ? Etiology- Prediabetes- defer  to PCP- Stable.    DISPOSITION: - HOLD venofer   - 6  months- labs (cbc/bmp/iron  studies/ferritin) Dr Rennie, +/- venofer - la   No problem-specific Assessment & Plan notes found for this encounter.  All questions were answered. The patient knows to call the clinic with any problems, questions or concerns.  Tinnie KANDICE Dawn, NP 07/22/2024

## 2024-08-01 ENCOUNTER — Ambulatory Visit: Admitting: Cardiology

## 2024-08-01 NOTE — Progress Notes (Deleted)
 Cardiology Office Note    Date:  08/01/2024   ID:  Vega, Kathy December 15, 1975, MRN [id number]  PCP:  Center, Car[facility] Blamer [facility]  Cardiologist:  None  Electrophysiologist:  None   Chief Complaint: ***  History of Present Illness:   Kathy Vega is a 48 y.o. female with history of ARTs, dilated cardiomyopathy, chronic shortness of breath, peripheral edema, obesity, symptomatic anemia due to heavy menses, and palpitations who presents for***.    Patient was hospitalized 07/2020 with pneumonia and heart secondary to COVID-19 requiring intubation.  Echo at that time showed EF 45 to 50% with global hypokinesis, severe concentric LVH, G1 DD with small pericardial effusion.  Follow-up echo 07/2021 with challenging images and LVEF estimated at 45 to 50% with global hypokinesis and mildly dilated LV cavity size with moderate LVH.  Patient established with Dr. Gollan 02/2022 and endorsed shortness of breath and irregular heartbeat.  She was started on Lasix  20 mg daily as needed for swelling and accompanying potassium supplementation.  Repeat echo revealed improved EF of 50 to 55% with no RWMA.  Event monitor revealed predominantly sinus rhythm with rare PVCs/PACs.  Patient was seen in follow-up 03/2023 and reported ongoing symptomatic palpitations.  She was started on bisoprolol  5 mg daily and propranolol  as needed for breakthrough palpitations.    ***  Labs independently reviewed: 06/2024 sodium 135, potassium 3.8, BUN 10, creatinine 0.88, Hgb 12.4, HCT 38.6, platelets 276,  Objective   Past Medical History:  Diagnosis Date   Acute encephalopathy 08/13/2020   Acute renal failure (ARF) (HCC) 08/13/2020   Anemia    Asthma    Cervical cancer (HCC)    Hypothyroidism    Iron  deficiency anemia    Obesity (BMI 30-39.9)    Pneumonia due to COVID-19 virus 08/13/2020   requiring trach and prolonged hospitalization   Subglottic stenosis    Tracheal stenosis    Tracheostomy tube present  [facility])     Current Medications: No outpatient medications have been marked as taking for the 08/01/24 encounter (Appointment) with Gerard Frederick, NP.   Current Facility-Administered Medications for the 08/01/24 encounter (Appointment) with Gerard Frederick, NP  Medication   betamethasone  acetate-betamethasone  sodium phosphate  (CELESTONE ) injection 3 mg    Allergies:   Patient has no known allergies.   Social History   Socioeconomic History   Marital status: Married    Spouse name: Ellaree   Number of children: 3   Years of education: Not on file   Highest education level: Not on file  Occupational History   Not on file  Tobacco Use   Smoking status: Former    Current packs/day: 0.00    Average packs/day: 0.5 packs/day for 5.0 years (2.5 ttl pk-yrs)    Types: Cigarettes    Start date: 94    Quit date: 2000    Years since quitting: 25.6   Smokeless tobacco: Never  Vaping Use   Vaping status: Never Used  Substance and Sexual Activity   Alcohol use: No   Drug use: Never   Sexual activity: Yes    Birth control/protection: Surgical    Comment: BTL  Other Topics Concern   Not on file  Social History Narrative   Not on file   Social Drivers of Health   Financial Resource Strain: Low Risk  (03/12/2024)   Received from [facility]   Overall Financial Resource Strain (CARDIA)    Difficulty of Paying Living Expenses: Not hard at all  Food Insecurity: No Food Insecurity (03/12/2024)   Received from [facility]   Hunger Vital Sign    Within the past 12 months, you worried that your food would run out before you got the money to buy more.: Never true    Within the past 12 months, the food you bought just didn't last and you didn't have money to get more.: Never true  Transportation Needs: No Transportation Needs (03/12/2024)   Received from [facility] - Transportation    Lack of Transportation (Medical): No    Lack of Transportation (Non-Medical): No   Physical Activity: Not on file  Stress: Not on file  Social Connections: Not on file     Family History:  The patient's family history includes Heart disease in her father; Hyperlipidemia in her mother; Hypertension in her mother; Liver cancer in her father. There is no history of Breast cancer or Ovarian cancer.  ROS:   12-point review of systems is negative unless otherwise noted in the HPI.  EKGs/Other Studies Reviewed:    Studies reviewed were summarized above. The additional studies were reviewed today:  03/2022 Long term monitor Isolated SVEs were rare (<1.0%), and no SVE Couplets or SVE Triplets were present. Isolated VEs were rare (<1.0%), VE Couplets were rare (<1.0%), and no VE Triplets were present.   03/2022 Echo complete 1. Left ventricular ejection fraction, by estimation, is 50 to 55%. The  left ventricle has low normal function. The left ventricle has no regional  wall motion abnormalities. Left ventricular diastolic parameters were  normal.   2. Right ventricular systolic function is normal. The right ventricular  size is normal.   3. The mitral valve is normal in structure. No evidence of mitral valve  regurgitation.   4. The aortic valve is tricuspid. Aortic valve regurgitation is not  visualized.   5. The inferior vena cava is normal in size with greater than 50%  respiratory variability, suggesting right atrial pressure of 3 mmHg.   EKG:  EKG personally reviewed by me today    PHYSICAL EXAM:    VS:  There were no vitals taken for this visit.  BMI: There is no height or weight on file to calculate BMI.  GEN: Well nourished, well developed in no acute distress NECK: No JVD; No carotid bruits CARDIAC: ***RRR, no murmurs, rubs, gallops RESPIRATORY:  Clear to auscultation without rales, wheezing or rhonchi  ABDOMEN: Soft, non-tender, non-distended EXTREMITIES:  *** No edema; No deformity  Wt Readings from Last 3 Encounters:  07/22/24 283 lb (128.4 kg)   06/19/24 280 lb (127 kg)  06/05/24 280 lb (127 kg)        ASSESSMENT & PLAN:   HFimpEF   Palpitations    {Are you ordering a CV Procedure (e.g. stress test, cath, DCCV, TEE, etc)?   Press F2        :[id number]}   Disposition: F/u with Dr. Gollan or an APP in ***.   Medication Adjustments/Labs and Tests Ordered: Current medicines are reviewed at length with the patient today.  Concerns regarding medicines are outlined above. Medication changes, Labs and Tests ordered today are summarized above and listed in the Patient Instructions accessible in Encounters.   Bonney Lesley Maffucci, PA-C 08/01/2024 12:04 PM     [facility] [place] [address] [place], [place] [address] [phone]

## 2024-08-05 ENCOUNTER — Other Ambulatory Visit: Payer: Self-pay | Admitting: Physician Assistant

## 2024-08-05 DIAGNOSIS — Z1231 Encounter for screening mammogram for malignant neoplasm of breast: Secondary | ICD-10-CM

## 2024-08-09 ENCOUNTER — Emergency Department
Admission: EM | Admit: 2024-08-09 | Discharge: 2024-08-10 | Disposition: A | Discharge status: Home / Self Care | Attending: Emergency Medicine | Admitting: Emergency Medicine

## 2024-08-09 ENCOUNTER — Other Ambulatory Visit: Payer: Self-pay

## 2024-08-09 DIAGNOSIS — J9502 Infection of tracheostomy stoma: Secondary | ICD-10-CM | POA: Insufficient documentation

## 2024-08-09 NOTE — ED Triage Notes (Signed)
 Patient brought in by husband for difficulty breathing and speaking with trach speaking valve x1 week. Patient concerned for clog. AxOx4, ambulatory with steady gait. ENT Dr Junie @ [place].

## 2024-08-09 NOTE — ED Provider Notes (Signed)
 [facility] Provider Note    Event Date/Time   First MD Initiated Contact with Patient 08/09/24 2350     (approximate)   History   Medical Management of Chronic Issues   HPI Kathy Vega is a 48 y.o. female with a history of chronic respiratory failure with trach placement since at least 2021, managed by ENT at [facility]  She presents with concerns that her trach may be clogged or have some other issue.  She says that she thinks it was changed out within the last 1 to 2 months (she says that it is changed out every 3 months).  However over the last week she has had increased difficulty speaking using the speaking valve and feels like it may be clogged or blocked.  She said that she has been having to use increased dressing changes due to some discharge that also has a bad smell.  She denies fever, severe difficulty breathing, and any other symptoms.  She has not called her ENT this week since she has been having issues.     Physical Exam   Triage Vital Signs: ED Triage Vitals  Encounter Vitals Group     BP 08/09/24 2006 (!) 139/109     Girls Systolic BP Percentile --      Girls Diastolic BP Percentile --      Boys Systolic BP Percentile --      Boys Diastolic BP Percentile --      Pulse Rate 08/09/24 2006 74     Resp 08/09/24 2006 20     Temp 08/09/24 2006 97.6 F (36.4 C)     Temp Source 08/09/24 2006 Oral     SpO2 08/09/24 2006 94 %     Weight 08/09/24 2007 127 kg (280 lb)     Height --      Head Circumference --      Peak Flow --      Pain Score 08/09/24 2007 0     Pain Loc --      Pain Education --      Exclude from Growth Chart --     Most recent vital signs: Vitals:   08/10/24 0049 08/10/24 0100  BP: (!) 142/101 125/84  Pulse: 80 73  Resp: 16   Temp: 97.6 F (36.4 C)   SpO2: 97% 99%    General: Awake, no  Obvious distress. CV:  Good peripheral perfusion.  Regular rate and rhythm, normal heart sounds. Resp:  Normal effort. no  accessory muscle usage nor intercostal retractions.  Lungs are clear to auscultation.  When using her speaking valve, her voice is mostly a whisper rather than actually vocalized.  However she is in no distress. Abd:   Morbid obesity.   ED Results / Procedures / Treatments   Labs (all labs ordered are listed, but only abnormal results are displayed) Labs Reviewed - No data to display    PROCEDURES:  Critical Care performed: No  Procedures    IMPRESSION / MDM / ASSESSMENT AND PLAN / ED COURSE  I reviewed the triage vital signs and the nursing notes.                              Differential diagnosis includes, but is not limited to, trach malfunction or clogged, infection around the trach site, pneumonia.  Patient's presentation is most consistent with acute presentation with potential threat to life or bodily function.  Interventions/Medications given:  Medications  doxycycline  (VIBRA -TABS) tablet 100 mg (has no administration in time range)    (Note:  hospital course my include additional interventions and/or labs/studies not listed above.)   Patient's vitals are stable and reassuring other than mild hypertension and she is in no respiratory distress, though she states that her airway equipment is not working as well as it has in the past.  I reviewed the medical record in care everywhere and the last ENT note I see at [facility] was 2 months ago at an office visit.  However the patient says that she has had a trach Change since then.  The office note mentions that she is due for a change within the next month so that would correlate but it is unclear why there is no documentation of a change.  I will contact RT and asked them to assess the trach and suction it and see if they feel additional evaluation is needed.  Patient agrees with the plan.     Clinical Course as of 08/10/24 0133  Sat Aug 10, 2024  0002 Consulted with respiratory therapy who will come and evaluate and  treat the patient. [CF]  0105 Dee from respiratory therapy updated me.  She evaluated the patient and suctioned as well as use some positive pressure ventilation.  She has no concerns about the patency of the trach and did acknowledge some drainage that may represent a mild superficial infection.  She agrees the patient does not require additional imaging or inpatient treatment.  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 [CF]  0130 Patient is comfortable with the plan for discharge.  She is in no distress I will treat empirically with doxycycline . [CF]  0132 Of note, there was no discharge present at the time I reassessed her that I could swab for culture, but I reviewed the clinic note from Dr. Junie and see that the patient has had MRSA in the past sensitive to doxycycline , Bactrim , and clindamycin.  I chose doxycycline  for her empiric treatment tonight. [CF]    Clinical Course User Index [CF] Gordan Huxley, MD     FINAL CLINICAL IMPRESSION(S) / ED DIAGNOSES   Final diagnoses:  Tracheostomy infection (HCC)     Rx / DC Orders   ED Discharge Orders          Ordered    doxycycline  (VIBRA -TABS) 100 MG tablet  2 times daily        08/10/24 0132             Note:  This document was prepared using Dragon voice recognition software and may include unintentional dictation errors.   Gordan Huxley, MD 08/10/24 [phone]

## 2024-08-10 DIAGNOSIS — J9502 Infection of tracheostomy stoma: Secondary | ICD-10-CM | POA: Diagnosis not present

## 2024-08-10 MED ORDER — DOXYCYCLINE HYCLATE 100 MG PO TABS
100.0000 mg | ORAL_TABLET | Freq: Once | ORAL | Status: AC
  Administered 2024-08-10: 100 mg via ORAL
  Filled 2024-08-10: qty 1

## 2024-08-10 MED ORDER — DOXYCYCLINE HYCLATE 100 MG PO TABS: 100.0000 mg | ORAL_TABLET | Freq: Two times a day (BID) | ORAL | 0 refills | Status: DC

## 2024-08-10 NOTE — Progress Notes (Signed)
 Patient seen due to difficulty breathing and speaking via trach. Patient with #4 flex shiley trach. BBS diminished and clear. States feels as if she has something in her throat and chest that is blocking her ability to breath. Room air saturation noted at 96%. Changed patient's inner cannula, tie, and gauze. Advised best to have her ENT to change her trach due to her stenosis as we would only do such in an emergency as she question if such needed to be changed as well. Airway suctioned for possible mucus plug. Very small amount of clear secretions present with procedure. Patient with good strong cough. Yellow/green drainage noted around dressing of trach which may be reason behind some of the discomfort patient is experiencing. Patient does not have aerosol humidification for trach at home. Discussed finding with Dr Gordan.

## 2024-08-10 NOTE — Discharge Instructions (Addendum)
 Please continue to care for your tracheostomy as previously instructed.  Take the full course of antibiotics as prescribed.  Call the office of Dr. Junie on Monday morning and explain the symptoms you have been having and ask for the next available follow-up appointment with him or one of his colleagues.  Return to the emergency department if you develop new or worsening symptoms that concern you.

## 2024-08-12 ENCOUNTER — Ambulatory Visit: Attending: Cardiology | Admitting: Cardiology

## 2024-08-12 NOTE — Progress Notes (Deleted)
 Cardiology Office Note    Date:  08/12/2024   ID:  Kathy Vega, Kathy Vega 03/04/1976, MRN [id number]  PCP:  [facility]  Cardiologist:  None  Electrophysiologist:  None   Chief Complaint: ***  History of Present Illness:   Kathy Vega is a 48 y.o. female with history of ARTs, dilated cardiomyopathy, chronic shortness of breath, peripheral edema, obesity, symptomatic anemia due to heavy menses, and palpitations who presents for***.    Patient was hospitalized 07/2020 with pneumonia and heart secondary to COVID-19 requiring intubation.  Echo at that time showed EF 45 to 50% with global hypokinesis, severe concentric LVH, G1 DD with small pericardial effusion.  Follow-up echo 07/2021 with challenging images and LVEF estimated at 45 to 50% with global hypokinesis and mildly dilated LV cavity size with moderate LVH.  Patient established with Dr. Gollan 02/2022 and endorsed shortness of breath and irregular heartbeat.  She was started on Lasix  20 mg daily as needed for swelling and accompanying potassium supplementation.  Repeat echo revealed improved EF of 50 to 55% with no RWMA.  Event monitor revealed predominantly sinus rhythm with rare PVCs/PACs.  Patient was seen in follow-up 03/2023 and reported ongoing symptomatic palpitations.  She was started on bisoprolol  5 mg daily and propranolol  as needed for breakthrough palpitations.    ***  Labs independently reviewed: 06/2024 sodium 135, potassium 3.8, BUN 10, creatinine 0.88, Hgb 12.4, HCT 38.6, platelets 276,  Objective   Past Medical History:  Diagnosis Date   Acute encephalopathy 08/13/2020   Acute renal failure (ARF) (HCC) 08/13/2020   Anemia    Asthma    Cervical cancer (HCC)    Hypothyroidism    Iron  deficiency anemia    Obesity (BMI 30-39.9)    Pneumonia due to COVID-19 virus 08/13/2020   requiring trach and prolonged hospitalization   Subglottic stenosis    Tracheal stenosis    Tracheostomy tube present  [facility])     Current Medications: No outpatient medications have been marked as taking for the 08/12/24 encounter (Appointment) with Gerard Frederick, NP.   Current Facility-Administered Medications for the 08/12/24 encounter (Appointment) with Gerard Frederick, NP  Medication   betamethasone  acetate-betamethasone  sodium phosphate  (CELESTONE ) injection 3 mg    Allergies:   Patient has no known allergies.   Social History   Socioeconomic History   Marital status: Married    Spouse name: Ellaree   Number of children: 3   Years of education: Not on file   Highest education level: Not on file  Occupational History   Not on file  Tobacco Use   Smoking status: Former    Current packs/day: 0.00    Average packs/day: 0.5 packs/day for 5.0 years (2.5 ttl pk-yrs)    Types: Cigarettes    Start date: 20    Quit date: 2000    Years since quitting: 25.7   Smokeless tobacco: Never  Vaping Use   Vaping status: Never Used  Substance and Sexual Activity   Alcohol use: No   Drug use: Never   Sexual activity: Yes    Birth control/protection: Surgical    Comment: BTL  Other Topics Concern   Not on file  Social History Narrative   Not on file   Social Drivers of Health   Financial Resource Strain: Low Risk  (03/12/2024)   Received from [facility]   Overall Financial Resource Strain (CARDIA)    Difficulty of Paying Living Expenses: Not hard at all  Food Insecurity: No Food Insecurity (03/12/2024)   [facility]   Hunger Vital Sign    Within the past 12 months, you worried that your food would run out before you got the money to buy more.: Never true    Within the past 12 months, the food you bought just didn't last and you didn't have money to get more.: Never true  Transportation Needs: No Transportation Needs (03/12/2024)   Received from [facility] - Transportation    Lack of Transportation (Medical): No    Lack of Transportation (Non-Medical): No   Physical Activity: Not on file  Stress: Not on file  Social Connections: Not on file     Family History:  The patient's family history includes Heart disease in her father; Hyperlipidemia in her mother; Hypertension in her mother; Liver cancer in her father. There is no history of Breast cancer or Ovarian cancer.  ROS:   12-point review of systems is negative unless otherwise noted in the HPI.  EKGs/Other Studies Reviewed:    Studies reviewed were summarized above. The additional studies were reviewed today:  03/2022 Long term monitor Isolated SVEs were rare (<1.0%), and no SVE Couplets or SVE Triplets were present. Isolated VEs were rare (<1.0%), VE Couplets were rare (<1.0%), and no VE Triplets were present.   03/2022 Echo complete 1. Left ventricular ejection fraction, by estimation, is 50 to 55%. The  left ventricle has low normal function. The left ventricle has no regional  wall motion abnormalities. Left ventricular diastolic parameters were  normal.   2. Right ventricular systolic function is normal. The right ventricular  size is normal.   3. The mitral valve is normal in structure. No evidence of mitral valve  regurgitation.   4. The aortic valve is tricuspid. Aortic valve regurgitation is not  visualized.   5. The inferior vena cava is normal in size with greater than 50%  respiratory variability, suggesting right atrial pressure of 3 mmHg.   EKG:  EKG personally reviewed by me today    PHYSICAL EXAM:    VS:  There were no vitals taken for this visit.  BMI: There is no height or weight on file to calculate BMI.  GEN: Well nourished, well developed in no acute distress NECK: No JVD; No carotid bruits CARDIAC: ***RRR, no murmurs, rubs, gallops RESPIRATORY:  Clear to auscultation without rales, wheezing or rhonchi  ABDOMEN: Soft, non-tender, non-distended EXTREMITIES:  *** No edema; No deformity  Wt Readings from Last 3 Encounters:  08/09/24 280 lb (127 kg)   07/22/24 283 lb (128.4 kg)  06/19/24 280 lb (127 kg)        ASSESSMENT & PLAN:   HFimpEF   Palpitations    {Are you ordering a CV Procedure (e.g. stress test, cath, DCCV, TEE, etc)?   Press F2        :[id number]}   Disposition: F/u with Dr. Gollan or an APP in ***.   Medication Adjustments/Labs and Tests Ordered: Current medicines are reviewed at length with the patient today.  Concerns regarding medicines are outlined above. Medication changes, Labs and Tests ordered today are summarized above and listed in the Patient Instructions accessible in Encounters.   Bonney Lesley Maffucci, PA-C 08/12/2024 8:39 AM     [facility] [place] [address] [place], [place] [address] [phone]

## 2024-08-20 ENCOUNTER — Encounter: Payer: Self-pay | Admitting: Cardiology

## 2024-08-20 ENCOUNTER — Ambulatory Visit: Attending: Cardiology | Admitting: Cardiology

## 2024-08-20 VITALS — BP 134/88 | HR 78 | Ht 69.0 in | Wt 287.0 lb

## 2024-08-20 DIAGNOSIS — I5032 Chronic diastolic (congestive) heart failure: Secondary | ICD-10-CM | POA: Diagnosis not present

## 2024-08-20 DIAGNOSIS — I420 Dilated cardiomyopathy: Secondary | ICD-10-CM

## 2024-08-20 DIAGNOSIS — R002 Palpitations: Secondary | ICD-10-CM | POA: Diagnosis not present

## 2024-08-20 DIAGNOSIS — R0602 Shortness of breath: Secondary | ICD-10-CM | POA: Diagnosis not present

## 2024-08-20 DIAGNOSIS — Z01810 Encounter for preprocedural cardiovascular examination: Secondary | ICD-10-CM

## 2024-08-20 NOTE — Patient Instructions (Signed)
 Medication Instructions:  Your physician recommends that you continue on your current medications as directed. Please refer to the Current Medication list given to you today.   *If you need a refill on your cardiac medications before your next appointment, please call your pharmacy*  Lab Work: No labs ordered today  If you have labs (blood work) drawn today and your tests are completely normal, you will receive your results only by: MyChart Message (if you have MyChart) OR A paper copy in the mail If you have any lab test that is abnormal or we need to change your treatment, we will call you to review the results.  Testing/Procedures: Your physician has requested that you have an echocardiogram. Echocardiography is a painless test that uses sound waves to create images of your heart. It provides your doctor with information about the size and shape of your heart and how well your heart's chambers and valves are working.   You may receive an ultrasound enhancing agent through an IV if needed to better visualize your heart during the echo. This procedure takes approximately one hour.  There are no restrictions for this procedure.  This will take place at [address] [facility] [address], [place] [address]  Please note: We ask at that you not bring children with you during ultrasound (echo/ vascular) testing. Due to room size and safety concerns, children are not allowed in the ultrasound rooms during exams. Our front office staff cannot provide observation of children in our lobby area while testing is being conducted. An adult accompanying a patient to their appointment will only be allowed in the ultrasound room at the discretion of the ultrasound technician under special circumstances. We apologize for any inconvenience.   Follow-Up: At [facility], you and your health needs are our priority.  As part of our continuing mission to provide you with exceptional heart  care, our providers are all part of one team.  This team includes your primary Cardiologist (physician) and Advanced Practice Providers or APPs (Physician Assistants and Nurse Practitioners) who all work together to provide you with the care you need, when you need it.  Your next appointment:   3 month(s)  Provider:   You may see one of the following Advanced Practice Providers on your designated Care Team:   Lonni Meager, NP Lesley Maffucci, PA-C Bernardino Bring, PA-C Cadence Wiseman, PA-C Tylene Lunch, NP Barnie Hila, NP

## 2024-08-20 NOTE — Progress Notes (Signed)
 Cardiology Office Note    Date:  08/20/2024   ID:  Wilbert, Schouten [birth year], MRN [id number]  PCP:  [facility]  Cardiologist:  None  Electrophysiologist:  None   Chief Complaint: Follow-up  History of Present Illness:   Kathy Vega is a 48 y.o. female with history of ARTs, dilated cardiomyopathy, chronic shortness of breath, peripheral edema, obesity, symptomatic anemia due to heavy menses, and palpitations who presents for follow-up of her dilated cardiomyopathy and chronic shortness of breath.    Patient was hospitalized 07/2020 with pneumonia and heart secondary to COVID-19 requiring intubation.  Echo at that time showed EF 45 to 50% with global hypokinesis, severe concentric LVH, G1 DD with small pericardial effusion.  Follow-up echo 07/2021 with challenging images and LVEF estimated at 45 to 50% with global hypokinesis and mildly dilated LV cavity size with moderate LVH.  Patient established with Dr. Gollan 02/2022 and endorsed shortness of breath and irregular heartbeat.  She was started on Lasix  20 mg daily as needed for swelling and accompanying potassium supplementation.  Repeat echo revealed improved EF of 50 to 55% with no RWMA.  Event monitor revealed predominantly sinus rhythm with rare PVCs/PACs.  Patient was seen in follow-up 03/2023 and reported ongoing symptomatic palpitations.  She was started on bisoprolol  5 mg daily and propranolol  as needed for breakthrough palpitations.    She returns today accompanied by her husband.  Unfortunately she has had issues with her trach and no longer has a voice.  She continues to have chronic shortness of breath related to her trach.  She also stated that she has experienced occasional prickly sharp pains across her chest that happen with rest or exertion.  Occasionally sometimes she has palpitations and occasional swelling to her bilateral lower extremities.  She states that she has been compliant with her current  medication regimen.  Due to waking up short of breath send she has been evaluated in the emergency department.  She has been diagnosed with tracheal stenosis and has an upcoming procedure scheduled on 08/30/2024.  Labs independently reviewed: 06/2024 sodium 135, potassium 3.8, BUN 10, creatinine 0.88, Hgb 12.4, HCT 38.6, platelets 276,  Objective   Past Medical History:  Diagnosis Date   Acute encephalopathy 08/13/2020   Acute renal failure (ARF) 08/13/2020   Anemia    Asthma    Cervical cancer (HCC)    Hypothyroidism    Iron  deficiency anemia    Obesity (BMI 30-39.9)    Pneumonia due to COVID-19 virus 08/13/2020   requiring trach and prolonged hospitalization   Subglottic stenosis    Tracheal stenosis    Tracheostomy tube present (HCC)     Current Medications: Current Meds  Medication Sig   acetaminophen  (TYLENOL ) 500 MG tablet Take 500 mg by mouth every 6 (six) hours as needed for headache, fever, moderate pain or mild pain.   albuterol  (PROVENTIL ) (2.5 MG/3ML) 0.083% nebulizer solution Take 3 mLs (2.5 mg total) by nebulization every 6 (six) hours as needed for wheezing or shortness of breath.   bisoprolol  (ZEBETA ) 5 MG tablet Take 1 tablet (5 mg total) by mouth daily.   budesonide  (PULMICORT ) 0.5 MG/2ML nebulizer solution Take 2 mLs (0.5 mg total) by nebulization 2 (two) times daily.   Cholecalciferol (VITAMIN D3) 125 MCG (5000 UT) CAPS Take 1 capsule (5,000 Units total) by mouth daily with breakfast. Take along with calcium and magnesium.   doxycycline  (VIBRA -TABS) 100 MG tablet Take 1 tablet (100  mg total) by mouth 2 (two) times daily for 10 days.   formoterol  (PERFOROMIST ) 20 MCG/2ML nebulizer solution Take 2 mLs (20 mcg total) by nebulization 2 (two) times daily. Use before the budesonide , twice a day.   furosemide  (LASIX ) 20 MG tablet Take 1 tablet (20 mg total) by mouth daily as needed for edema (shortness of breath).   hydrOXYzine (ATARAX) 10 MG tablet Take 10 mg by mouth 3  (three) times daily.   levothyroxine  (SYNTHROID ) 125 MCG tablet Take 250 mcg by mouth daily.   OXYGEN Inhale into the lungs at bedtime as needed. Up to 4 liters   pregabalin (LYRICA) 50 MG capsule Take 50 mg by mouth.   Current Facility-Administered Medications for the 08/20/24 encounter (Office Visit) with Gerard Frederick, NP  Medication   betamethasone  acetate-betamethasone  sodium phosphate  (CELESTONE ) injection 3 mg    Allergies:   Patient has no known allergies.   Social History   Socioeconomic History   Marital status: Married    Spouse name: Ellaree   Number of children: 3   Years of education: Not on file   Highest education level: Not on file  Occupational History   Not on file  Tobacco Use   Smoking status: Former    Current packs/day: 0.00    Average packs/day: 0.5 packs/day for 5.0 years (2.5 ttl pk-yrs)    Types: Cigarettes    Start date: 89    Quit date: 2000    Years since quitting: 25.7   Smokeless tobacco: Never  Vaping Use   Vaping status: Never Used  Substance and Sexual Activity   Alcohol use: No   Drug use: Never   Sexual activity: Yes    Birth control/protection: Surgical    Comment: BTL  Other Topics Concern   Not on file  Social History Narrative   Not on file   Social Drivers of Health   Financial Resource Strain: Low Risk  (03/12/2024)   Received from [facility]   Overall Financial Resource Strain (CARDIA)    Difficulty of Paying Living Expenses: Not hard at all  Food Insecurity: No Food Insecurity (03/12/2024)   Received from [facility]   Hunger Vital Sign    Within the past 12 months, you worried that your food would run out before you got the money to buy more.: Never true    Within the past 12 months, the food you bought just didn't last and you didn't have money to get more.: Never true  Transportation Needs: No Transportation Needs (03/12/2024)   Received from [facility]   PRAPARE - Transportation    Lack of  Transportation (Medical): No    Lack of Transportation (Non-Medical): No  Physical Activity: Not on file  Stress: Not on file  Social Connections: Not on file     Family History:  The patient's family history includes Heart disease in her father; Hyperlipidemia in her mother; Hypertension in her mother; Liver cancer in her father. There is no history of Breast cancer or Ovarian cancer.  ROS:   12-point review of systems is negative unless otherwise noted in the HPI.  EKGs/Other Studies Reviewed:    Studies reviewed were summarized above. The additional studies were reviewed today:  03/2022 Long term monitor Isolated SVEs were rare (<1.0%), and no SVE Couplets or SVE Triplets were present. Isolated VEs were rare (<1.0%), VE Couplets were rare (<1.0%), and no VE Triplets were present.   03/2022 Echo complete 1. Left ventricular  ejection fraction, by estimation, is 50 to 55%. The  left ventricle has low normal function. The left ventricle has no regional  wall motion abnormalities. Left ventricular diastolic parameters were  normal.   2. Right ventricular systolic function is normal. The right ventricular  size is normal.   3. The mitral valve is normal in structure. No evidence of mitral valve  regurgitation.   4. The aortic valve is tricuspid. Aortic valve regurgitation is not  visualized.   5. The inferior vena cava is normal in size with greater than 50%  respiratory variability, suggesting right atrial pressure of 3 mmHg.   EKG:  EKG personally reviewed by me today EKG Interpretation Date/Time:  Tuesday August 20 2024 11:08:14 EDT Ventricular Rate:  78 PR Interval:  198 QRS Duration:  84 QT Interval:  336 QTC Calculation: 383 R Axis:   -32  Text Interpretation: Normal sinus rhythm Left axis deviation Low voltage QRS Nonspecific T wave abnormality When compared with ECG of 26-May-2023 14:20, No significant change since last tracing Confirmed by Gerard Frederick ([id number]) on  08/20/2024 11:15:16 AM  PHYSICAL EXAM:    VS:  BP 134/88 (BP Location: Left Arm, Patient Position: Sitting, Cuff Size: Large)   Pulse 78   Ht 5' 9 (1.753 m)   Wt 287 lb (130.2 kg)   SpO2 95%   BMI 42.38 kg/m   BMI: Body mass index is 42.38 kg/m.  GEN: Well nourished, well developed in no acute distress NECK: No JVD; No carotid bruits, tracheostomy intact secured with commercial tube holder CARDIAC: RRR, no murmurs, rubs, gallops RESPIRATORY:  Clear to auscultation without rales, wheezing or rhonchi  ABDOMEN: Soft, non-tender, non-distended EXTREMITIES:   Trace pretibial edema to BLE; No deformity  Wt Readings from Last 3 Encounters:  08/20/24 287 lb (130.2 kg)  08/09/24 280 lb (127 kg)  07/22/24 283 lb (128.4 kg)        ASSESSMENT & PLAN:   HFimpEF/dilated cardiomyopathy with last LVEF 50-25% on echocardiogram completed in 03/2022.  There were no regional wall motion abnormalities and no valvular abnormalities noted.  With continued chronic shortness of breath she has been scheduled for an updated echocardiogram.  No EKG changes noted today.  She is continued on furosemide  20 mg daily as needed for edema and shortness of breath, bisoprolol  5 mg daily.  She appears to be euvolemic on exam today with NYHA class II symptoms that are chronic.    Palpitations with previous ZIO monitor revealing PVCs.  She has been continued on bisoprolol  5 mg daily which have since subsided the palpitations.  She states the only time she has palpitations when she has bouts of shortness of breath.   Preprocedural cardiovascular examination.  Patient has tracheal stenosis and has upcoming procedure completed.  She recently had an echocardiogram done in 2023 which showed an LVEF 50-55%.  Previously scheduled echocardiogram is just for surveillance studies as she always has chronic shortness of breath but there were no valvular abnormalities.  She is an acceptable risk without further cardiac workup needed  for upcoming procedure.  EKG today reveals sinus rhythm with rate of 78 with nonspecific T waves and a left axis deviation with no significant change from prior studies.       Disposition: F/u with Dr. Gollan or an APP in patient return to clinic see MD/APP in 3 months or sooner if needed for further evaluation..   Medication Adjustments/Labs and Tests Ordered: Current medicines are reviewed at length with  the patient today.  Concerns regarding medicines are outlined above. Medication changes, Labs and Tests ordered today are summarized above and listed in the Patient Instructions accessible in Encounters.   SignedTylene Lunch, NP 08/20/2024 2:47 PM     [facility] [place] [address] [place], [place] [address] [phone]

## 2024-08-22 ENCOUNTER — Ambulatory Visit (INDEPENDENT_AMBULATORY_CARE_PROVIDER_SITE_OTHER): Admitting: Pulmonary Disease

## 2024-08-22 ENCOUNTER — Encounter: Payer: Self-pay | Admitting: Pulmonary Disease

## 2024-08-22 VITALS — BP 140/100 | HR 82 | Temp 97.7°F | Ht 69.0 in | Wt 287.4 lb

## 2024-08-22 DIAGNOSIS — E669 Obesity, unspecified: Secondary | ICD-10-CM

## 2024-08-22 DIAGNOSIS — J8410 Pulmonary fibrosis, unspecified: Secondary | ICD-10-CM

## 2024-08-22 DIAGNOSIS — Z87891 Personal history of nicotine dependence: Secondary | ICD-10-CM | POA: Diagnosis not present

## 2024-08-22 DIAGNOSIS — Z930 Tracheostomy status: Secondary | ICD-10-CM | POA: Diagnosis not present

## 2024-08-22 DIAGNOSIS — J45909 Unspecified asthma, uncomplicated: Secondary | ICD-10-CM

## 2024-08-22 DIAGNOSIS — J398 Other specified diseases of upper respiratory tract: Secondary | ICD-10-CM | POA: Diagnosis not present

## 2024-08-22 DIAGNOSIS — J386 Stenosis of larynx: Secondary | ICD-10-CM

## 2024-08-22 DIAGNOSIS — E039 Hypothyroidism, unspecified: Secondary | ICD-10-CM

## 2024-08-22 DIAGNOSIS — J4540 Moderate persistent asthma, uncomplicated: Secondary | ICD-10-CM

## 2024-08-22 MED ORDER — BUDESONIDE 0.5 MG/2ML IN SUSP: 0.5000 mg | Freq: Two times a day (BID) | RESPIRATORY_TRACT | 11 refills | Status: AC

## 2024-08-22 MED ORDER — FORMOTEROL FUMARATE 20 MCG/2ML IN NEBU: 20.0000 ug | INHALATION_SOLUTION | Freq: Two times a day (BID) | RESPIRATORY_TRACT | 11 refills | Status: AC

## 2024-08-22 NOTE — Patient Instructions (Signed)
 VISIT SUMMARY:  Today, we discussed your difficulty with the valve due to tracheal stenosis and granulation tissue. We also reviewed your asthma management and confirmed that your thyroid  condition is stable.  YOUR PLAN:  -TRACHEAL STENOSIS WITH GRANULATION TISSUE: Tracheal stenosis is a narrowing of the windpipe, and granulation tissue is new tissue that forms during healing but can cause issues. We will coordinate with Dr. Junie for a revision surgery on October 3rd to address the granulation tissue, and we discussed the potential use of cryotherapy during the procedure.  -ASTHMA: Asthma is a condition where your airways narrow and swell, making it difficult to breathe. Your asthma is currently managed with a nebulizer, and we confirmed that you have sufficient medication refills. We will ensure that your nebulizer medication refills are sent to the pharmacy.  INSTRUCTIONS:  Please follow up with Dr. Boby for your revision surgery on October 3rd. Continue using your nebulizer as prescribed and ensure you have your medication refills.

## 2024-08-22 NOTE — Progress Notes (Signed)
 Subjective:    Patient ID: Kathy Vega, female    DOB: [birth year], 48 y.o.   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  Patient was last seen here on 19 Apr 2023 by me.  This is a scheduled visit.  As noted she has tracheal stenosis at has been followed by Dr. Junie at [facility].  She currently has a # 4 Shiley.  She has ongoing need for procedures to treat granulation tissue in the trachea.  Recently not tolerating Passy-Muir valve and is on a hiatus from it.  HPI Discussed the use of AI scribe software for clinical note transcription with the patient, who gave verbal consent to proceed.  History of Present Illness   Kathy Vega is a 48 year old female with asthma and tracheal stenosis who presents with difficulty tolerating Passy-Muir valve.  She reports difficulty tolerating the Passy-Muir valve and has granulation tissue associated with her tracheal stenosis. She has a history of severe respiratory issues, including a significant past ARDS event requiring ICU admission and prone positioning to aid her breathing.  This was in the setting of COVID-19 infection.  Since then she has had difficulties with subglottic stenosis and is being followed by Dr. Junie.  She has had multiple procedures to try to decannulate but these have been unsuccessful due to persistent issues with granulation tissue with significant inflammation and reactivity.  Her asthma is  currently managed with a nebulizer, and she has sufficient medication refills.  She reports that her thyroid  condition (hypothyroidism) is being followed and she has not experienced any recent issues.   She has nocturnal hypoxemia due to obesity and hypothyroidism, she is compliant with the therapy and notes benefit.      Review of Systems A 10 point review of systems was performed and it is as noted above otherwise negative.   Patient Active Problem List   Diagnosis Date Noted   Vitamin D  deficiency 06/19/2024   Swelling of foot (Right) 06/19/2024   Tarsal tunnel syndrome (Right) 06/19/2024   Chronic foot pain (Right) 06/05/2024   Tracheostomy status (HCC) 06/05/2024   Difficult intravenous access 06/05/2024   Obesity, Class III, BMI 40-49.9 (morbid obesity) 06/05/2024   Chronic pain syndrome 06/04/2024   Pharmacologic therapy 06/04/2024   Disorder of skeletal system 06/04/2024   Problems influencing health status 06/04/2024   Iron  deficiency 07/19/2022   Iron  deficiency anemia due to chronic blood loss 07/16/2022   Hypothyroidism    Acute on chronic blood loss anemia, symptomatic 07/28/2021   Asthma 03/23/2021   Tracheal stenosis 03/23/2021   Intubation of airway performed without difficulty    Pain    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(HCC) 09/18/2020   Acute respiratory failure (HCC)    Pneumonia due to COVID-19 virus 08/13/2020   Acute renal failure (ARF) 08/13/2020   Elevated liver enzymes 08/13/2020   CAP (community acquired pneumonia) 08/13/2020   Acute encephalopathy 08/13/2020   Hypokalemia 08/13/2020    Social History   Tobacco Use  Smoking status: Former    Current packs/day: 0.00    Average packs/day: 0.5 packs/day for 5.0 years (2.5 ttl pk-yrs)    Types: Cigarettes    Start date: 40    Quit date: 2000    Years since quitting: 25.7   Smokeless tobacco: Never  Substance Use Topics   Alcohol use: No     No Known Allergies  Current Meds  Medication Sig   acetaminophen  (TYLENOL ) 500 MG tablet Take 500 mg by mouth every 6 (six) hours as needed for headache, fever, moderate pain or mild pain.   albuterol  (PROVENTIL ) (2.5 MG/3ML) 0.083% nebulizer solution Take 3 mLs (2.5 mg total) by nebulization every 6 (six) hours as needed for wheezing or shortness of breath.   bisoprolol  (ZEBETA ) 5 MG tablet Take 1 tablet (5 mg total) by mouth daily.   Cholecalciferol (VITAMIN D3) 125 MCG (5000 UT) CAPS Take 1 capsule (5,000 Units total) by mouth daily with breakfast. Take along with calcium and magnesium.   furosemide  (LASIX ) 20 MG tablet Take 1 tablet (20 mg total) by mouth daily as needed for edema (shortness of breath).   hydrOXYzine (ATARAX) 10 MG tablet Take 10 mg by mouth 3 (three) times daily.   levothyroxine  (SYNTHROID ) 125 MCG tablet Take 250 mcg by mouth daily.   OXYGEN Inhale into the lungs at bedtime as needed. Up to 4 liters   pregabalin (LYRICA) 50 MG capsule Take 50 mg by mouth.   [DISCONTINUED] budesonide  (PULMICORT ) 0.5 MG/2ML nebulizer solution Take 2 mLs (0.5 mg total) by nebulization 2 (two) times daily.   [DISCONTINUED] formoterol  (PERFOROMIST ) 20 MCG/2ML nebulizer solution Take 2 mLs (20 mcg total) by nebulization 2 (two) times daily. Use before the budesonide , twice a day.   Current Facility-Administered Medications for the 08/22/24 encounter (Office Visit) with Tamea Dedra CROME, MD  Medication   betamethasone  acetate-betamethasone  sodium phosphate  (CELESTONE ) injection 3 mg     There is no immunization history on file for this patient.      Objective:     BP (!) 140/100   Pulse 82   Temp 97.7 F (36.5 C) (Temporal)   Ht 5' 9 (1.753 m)   Wt 287 lb 6.4 oz (130.4 kg)   LMP  (LMP Unknown)   SpO2 94% Comment: Room air  BMI 42.44 kg/m   SpO2: 94 % (Room air)  GENERAL: Obese woman, no acute distress, tracheostomy in place.  She has a Passy-Muir valve and able to  speak without difficulty.  Not wearing oxygen. HEAD: Normocephalic, atraumatic.  EYES: Pupils equal, round, reactive to light.  No scleral icterus.  MOUTH: Oral mucosa moist.  No thrush. NECK: Supple. No lymphadenopathy.  Tracheostomy in place as above with Passy-Muir valve. No JVD.  Trachea midline. PULMONARY: Good air entry bilaterally.  To auscultation bilaterally.   CARDIOVASCULAR: S1 and S2. Regular rate and rhythm.  No rubs, murmurs or gallops heard. ABDOMEN: Obese otherwise unremarkable. MUSCULOSKELETAL: No joint deformity, no clubbing, no edema.  NEUROLOGIC: No overt focal deficit. SKIN: Intact,warm,dry. PSYCH: Mood and behavior normal.    Assessment & Plan:     ICD-10-CM   1. Moderate persistent asthma without complication  J45.40 budesonide  (PULMICORT ) 0.5 MG/2ML nebulizer solution   Use albuterol  at least twice a day via neb Follow-up with Pulmicort  0.5 mg twice daily via neb Reorder nebulizer machine    2. 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 Subglottic stenosis  J38.6  4. 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 Tracheostomy dependence (HCC)  Z93.0       Meds ordered this encounter  Medications   budesonide  (PULMICORT ) 0.5 MG/2ML nebulizer solution    Sig: Take 2 mLs (0.5 mg total) by nebulization 2 (two) times daily.    Dispense:  120 mL    Refill:  11   formoterol  (PERFOROMIST ) 20 MCG/2ML nebulizer solution    Sig: Take 2 mLs (20 mcg total) by nebulization 2 (two) times daily. Use before the budesonide , twice a day.    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 She is not tolerating the valve well. - Coordinate with Dr. Junie for revision surgery on October 3rd to address granulation tissue. - Discuss potential use of cryotherapy during the procedure.  Asthma Asthma  management ongoing. She reports using a nebulizer and has sufficient medication refills. - Ensure nebulizer medication refills are sent to the pharmacy.      Advised if symptoms do not improve or worsen, to please contact office for sooner follow up or seek emergency care.    I spent 2 minutes of dedicated to the care of this patient on the date of this encounter to include pre-visit review of records, face-to-face time with the patient discussing conditions above, post visit ordering of testing, clinical documentation with the electronic health record, making appropriate referrals as documented, and communicating necessary findings to members of the patients care team.     C. Leita Sanders, MD Advanced Bronchoscopy PCCM [facility]    *This note was generated using voice recognition software/Dragon and/or AI transcription program.  Despite best efforts to proofread, errors can occur which can change the meaning. Any transcriptional errors that result from this process are unintentional and may not be fully corrected at the time of dictation.

## 2024-08-27 ENCOUNTER — Ambulatory Visit
Admission: RE | Admit: 2024-08-27 | Discharge: 2024-08-27 | Disposition: A | Discharge status: Home / Self Care | Source: Ambulatory Visit | Attending: Physician Assistant | Admitting: Physician Assistant

## 2024-08-27 DIAGNOSIS — Z1231 Encounter for screening mammogram for malignant neoplasm of breast: Secondary | ICD-10-CM | POA: Diagnosis present

## 2024-09-08 NOTE — Progress Notes (Signed)
 Post-op MDL  Visit  History of Present Illness:  [name] is a 48 y.o.  female patient with a history of  thyroid  nodule, anemia, Covid-19 infection in September 2021, indwelling tracheotomy for airway assessment.   The patient was hospitalized for Covid-19 infection in September 2021 requiring mechanical ventilation, intubation, and trach placed at this time (September 2021). Did attempt decannulation, but developed immediate SOB and required reinsertion. Kathy Vega has been downsized since initial placement, now with #6 Shiley. Denies concerns with skin around trach. She passes air around the trach to the glottis, however evaluation of her subglottic airway has been challenging. She is able to able to tolerate PMV for the most part, but intermittently feels short winded and takes it off. The patient uses oxygen mainly at night, but sometimes feels she could benefit from this during the day. The patient recently underwent CT at [facility]-I am unable to view the images, however the impression is:  Tracheostomy tube is well positioned. 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    March 2022: At the conclusion of the patient's initial visit, she was diagnosed with breath stacking with PM valve in place, suggesting limited airflow around #6 tracheotomy tube; #6 shiley tracheotomy in place; no laryngoscopic evidence of significant subglottic stenosis; and evidence of airway stenosis based upon breath stacking and outside CT. I recommend outpatient diagnostic laryngoscopy, bronchoscopy with excision and possible dilation of trachea.The patient will discuss oxygen support and overall lung functioning with pulmonology prior to  surgery. I will request OSH imaging from [facility], [facility] for review.    Apr 16, 2021: The patient underwent OR MDL with bronchoscopy with excision and dilation of SGS.  Intraoperatively the patient demonstrated a  mild posterior immediately-infraglottic stenosis, a Grade 2 stenosis (9 x 6 mm) in the suprastomal area with mild but insignificant tracheal collapse distally.  The patient was dilated to 14 mm, and the granulation and polypoid tissue was debrided. [...] September 2022 (phone pre-op): The patient is currently scheduled for operative intervention with MDL with laser radial excision of subglottic stenosis, bronchoscopy with radial balloon dilation on 08/13/21. She states that her symptoms with capping trials have been stable. She continues to endorse breath stacking when she removes the trach cap.     August 13, 2021: The patient underwent  MDL with balloon dilation of subglottic stenosis and rigid bronchoscopy. Intraoperatively, SGS starting 1.7cm below vocal process with mild posterior tracheal collapse was noted down to carina   October 2022: The patient returns for post-op. She denies dental injury, tongue numbness, taste disturbance, or other postoperative complication. The patient reports that she is able to cap her trach for around an hour before she begins feeling pain. We discussed that despite the appearance of an adequate airway caliber, successful capping requires ease of air passage around the internal component of the trach.We discussed that she can likely benefit from placement of Montgomery cannula as an potential interval step to decannulation. I counseled the patient that the procedure will require an overnight stay for care instructions of the montgomery cannula. I advised the patient to continue building up tolerance with her speaking valve in the interim.   November 2022 (phone pre-op): The patient is currently scheduled for operative intervention  with MDL  with placement of a montgomery cannula with overnight stay for care and teaching instructions.   February 25, 2022: The patient underwent MDL with placement of a Montgomery cannula and tracheotomy site revision. Intraoperative findings revealed suprastomal narrowing measuring 6 mm x 10 mm; Subglottis is 8 mm across. Mild tracheal collapse distally; [name] performed up to 14 mm; tracheostomy tract dilated with a 6.0 CFD Shiley. 6 Montgomery cannula placed.   April 2023: In the interim, the patient called the clinic and reported she is having a lot of thick mucous from new device and is not able to cough it out of tracheostomy as mucous is extremely thick. The patient returns to clinic for her first postoperative visit. She has been using a Q-tip to clean her trach. She oreviously had a suction machine but it broke. She feels some resistance while sniffing in when the trach cap is in place. We discussed that the patient may be a reasonable candidate for decannulation given she has been living the past few days without the trach cap in place. We have sent another order for a trach suction machine.   June 2023: The patient reports that she has had significant difficulty with plugging of her Montgomery cannula, having presented to the emergency room on 2 occasions in the last month.  Her cannula was removed cleaned and replaced by another laryngologist at [facility].  She is able to For several minutes/2 hours, however experiences breath stacking with a large egress of air when she removes the plug. [...] March 2024: In the interim since her last visit the patient reports that she has had a little more tightness of the neck anteriorly, and some intermittent discomfort.  Her respiratory and speaking symptoms are relatively the same.  She informs me that she would like to obtain additional information regarding the proposed tracheal resection surgery, and is considering postponing for  another year yet. 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  This preclude the standard approach that I had intended to take including tracheotomy below the site of repair due to the innominate interposition.  I informed the patient of this and told her that while I cannot, without preparation to perform the tracheal resection and pull up without thoracic surgery's help, and possible intrathoracic dissection, I will plan for endoscopy, and addressing the upper portion of her stenosis/subglottis/supra stomal collapse, potentially with an LTR borrowing cartilage from the larynx and interposing at the level of the subglottis/cricoid..  In the best case scenario this may afford her enough leeway to tolerate capping and decannulation without a larger tracheal resection surgery.  This would be at two-part surgery with planned follow-up endoscopy and stent removal   February 16, 2024: the patient underwent open laryngotracheal plasty with left-sided thyroid  cartilage graft and stenting.  The patient was hospitalized and discharged on POD #4 in stable condition   April 2025: In the postoperative period, the patient continues to have anterior neck pain that requires her to.  Remained on narcotic pain medicine approximately 13 days after her surgery.  She reports that she is able to tolerate her Passy-Muir valve, but at times does get symptoms of air trapping requiring that she removes it.  She denies significant subjective shortness of breath or dysphagia.   March 11, 2024:: The patient underwent  MDL with granulation excision, bronchoscopy, with left neck incision for removal  of the indwelling stent stitch.  Intraoperatively a fluid collection in the neck was drained   April 2025: In the interim the patient presented to the emergency room with red and swollen left neck from which was expressed purulence.  The patient was  treated with IV antibiotics in the emergency room and sent home with oral antibiotic.  Neck cultures demonstrated 4+ MRSA/3+ Pseudomonas/4+ strep aeruginosa's.  MRSA susceptibility: Clindamycin/doxycycline /Bactrim /Pseudomonas susceptibility: Ceftazidime/ciprofloxacin/levofloxacillin.  Blood cultures: No growth.   Documentation supports a change of medication to ciprofloxacin/doxycycline  (March 18, 2024)   April 2025: Mr. Hoyt confirms the change of antibiotics that she has received them and reports that her neck has felt better thereafter.  She reports no significant shortness of breath nor dysphagia, though the voice is somewhat and breathy at this juncture.  Interval history includes decrease swelling and pain of the left neck.  The patient continues to have her complaint pain/discomfort of the right plantar surface of the right foot following her previous surgery.  She has had podiatry consultation while in house, and has follow-up scheduled in June 2025.  Plan:Return operating room for dilation of reconstructed subglottic region/removal of granulation/trach change, with plan to attempt moving forward through decannulation protocol   July 2025: The patient reports some mild pain intermittently around the trach site.  She, for the most part reports that for the most part, she has been experiencing dysphonia in the mornings over the course the last 2 weeks.  Her voice does improve allowing her to have a usable audible voice.  The patient is approximately 3 months from her last trach change and planned for surgery with optimization of her subglottic airway in the next month.  August 30, 2024:: The patient underwent  MDL with MicroDirect laryngoscopy with excision of immediate subglottic granulation tissue, and diagnostic bronchoscopy.  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  She denies any significant dysphagia or shortness of breath with her #4 Shiley tracheotomy tube in place.  Interval Surgery/Medical History:  Interval  Past Medical History and ROS is otherwise unchanged except as per HPI   Medications:  Current Outpatient Medications:  .  acetaminophen  (TYLENOL ) 500 MG tablet, Take 2 tablets (1,000 mg total) by mouth every six (6) hours as needed for pain., Disp: , Rfl:  .  albuterol  2.5 mg /3 mL (0.083 %) nebulizer solution, Inhale 3 mL (2.5 mg total) by nebulization two (2) times a day., Disp: , Rfl:  .  budesonide  (PULMICORT ) 0.5 mg/2 mL nebulizer solution, Inhale 2 mL (0.5 mg total) by nebulization two (2) times a day., Disp: , Rfl:  .  ciprofloxacin-dexAMETHasone  (CIPRODEX) 0.3-0.1 % otic suspension, Use 4 drops into trach with finger occlusion/cough twice daily., Disp: 7.5 mL, Rfl: 2 .  formoterol  fumarate (PERFOROMIST ) 20 mcg/2 mL nebulizer solution, Inhale 2 mL (20 mcg total) by nebulization two (2) times a day., Disp: , Rfl:  .  levothyroxine  (SYNTHROID ) 125 MCG tablet, Take 1 tablet (125 mcg total) by mouth in the morning., Disp: , Rfl:  .  MYFEMBREE  40-1-0.5 mg Tab, Take 1 tablet by mouth in the morning., Disp: , Rfl:  .  oxyCODONE (ROXICODONE) 5 MG immediate release tablet, Take 1 tablet (5 mg total) by mouth every six (6) hours as needed for pain for up to 10 doses., Disp: 10 tablet, Rfl:  0 .  pregabalin (LYRICA) 50 MG capsule, Take 1 capsule (50 mg total) by mouth Three (3) times a day., Disp: , Rfl:     ROS as above and per HPI.   Physical Exam:   Voice: Clear with PMV valve in place Nose:  External nose midline, anterior rhinoscopy is normal with limited visualization just to the anterior interior turbinate.  Oral Cavity/Oropharynx/Lips:  Normal mucous membranes, normal floor of mouth/tongue/oropharynx, no masses or lesions are noted.  Good dentition No  evidence of lingual cortex erosion of mandible  Pharyngeal Walls:  No masses noted. Neck/Lymph:  No lymphadenopathy, no thyroid  masses.     Procedure Note  Procedure Note  Endoscopy Type:  Flexible Fiberoptic Laryngoscopy   Indications/TimeOut:  To better evaluate the patient's symptoms, fiberoptic videostroboscopy is indicated.   A time out identifying the patient, the procedure, the location of the procedure and any concerns was performed prior to beginning the procedure.  Procedure Details:   The patient was placed in the sitting position.  After topical anesthesia and decongestion with oxymetazolaine and lidocaine , the flexible laryngoscope was passed.   -  Nasal cavity  - There was no pus or polyps noted in the nasal cavity. Septum intact -  Nasopharynx - there was no evidence of mass or lesion.  -  Hypopharynx:    Normal  -  Supraglottis:      Patent/normal  -  Vocal Folds:   Bilateral true vocal mobility  -  Subglottis :   Patent/tracheotomy visible through vocal folds  Lesions: None  Condition: Stable.  Patient tolerated procedure well.  Complications: None   Assessment:  48 y.o.  female patient with a history of  thyroid  nodule, anemia, Covid-19 infection in September 2021, indwelling tracheotomy for airway assessment.   The patient was hospitalized for Covid-19 infection in September 2021 requiring mechanical ventilation, intubation, and trach placed at this time (September 2021). Did attempt decannulation, but developed immediate SOB and required reinsertion. Kathy Vega has been downsized since initial placement, now with #6 Shiley. Denies concerns with skin around trach. She passes air around the trach to the glottis, however evaluation of her subglottic airway has been challenging. She is able to able to tolerate PMV for the most part, but intermittently feels short winded and takes it off. The patient uses oxygen mainly at night, but sometimes feels she could  benefit from this during the day. The patient recently underwent CT at [facility]-I am unable to view the images, however the impression is:  Tracheostomy tube is well positioned. 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    March 2022: At the conclusion of the patient's initial visit, she was diagnosed with breath stacking with PM valve in place, suggesting limited airflow around #6 tracheotomy tube; #6 shiley tracheotomy in place; no laryngoscopic evidence of significant subglottic stenosis; and evidence of airway stenosis based upon breath stacking and outside CT. I recommend outpatient diagnostic laryngoscopy, bronchoscopy with excision and possible dilation of trachea.The patient will discuss oxygen support and overall lung functioning with pulmonology prior to surgery. I will request OSH imaging from [facility] for review.    Apr 16, 2021: The patient underwent OR MDL with bronchoscopy with excision and dilation of SGS.  Intraoperatively the patient demonstrated a  mild posterior immediately-infraglottic stenosis, a Grade 2 stenosis (9 x 6 mm) in the suprastomal area with mild but insignificant tracheal collapse distally.  The patient was dilated to 14 mm, and the granulation and polypoid tissue was debrided. [...] September 2022 (phone pre-op): The patient is currently scheduled for operative intervention with MDL with laser radial excision of subglottic stenosis, bronchoscopy with radial balloon dilation on 08/13/21. She states that her symptoms with capping trials have been stable. She continues to endorse breath stacking when she removes the trach cap.     August 13, 2021: The patient underwent  MDL  with balloon dilation of subglottic stenosis and rigid bronchoscopy. Intraoperatively, SGS starting 1.7cm below vocal process with mild posterior tracheal collapse was noted down to carina   October 2022: The patient returns for post-op. She denies dental injury, tongue numbness, taste disturbance, or other postoperative complication. The patient reports that she is able to cap her trach for around an hour before she begins feeling pain. We discussed that despite the appearance of an adequate airway caliber, successful capping requires ease of air passage around the internal component of the trach.We discussed that she can likely benefit from placement of Montgomery cannula as an potential interval step to decannulation. I counseled the patient that the procedure will require an overnight stay for care instructions of the montgomery cannula. I advised the patient to continue building up tolerance with her speaking valve in the interim.   November 2022 (phone pre-op): The patient is currently scheduled for operative intervention with MDL with placement of a montgomery cannula with overnight stay for care and teaching instructions.   February 25, 2022: The patient underwent MDL with placement of a Montgomery cannula and tracheotomy site revision. Intraoperative findings revealed suprastomal narrowing measuring 6 mm x 10 mm; Subglottis is 8 mm across. Mild tracheal collapse distally; [name] performed up to 14 mm; tracheostomy tract dilated with a 6.0 CFD Shiley. 6 Montgomery cannula placed.   April 2023: In the interim, the patient called the clinic and reported she is having a lot of thick mucous from new device and is not able to cough it out of tracheostomy as mucous is extremely thick. The patient returns to clinic for her first postoperative visit. She has been using a Q-tip to clean her trach. She oreviously had a suction machine but it broke. She feels some resistance while sniffing in when  the trach cap is in place. We discussed that the patient may be a reasonable candidate for decannulation given she has been living the past few days without the trach cap in place. We have sent another order for a trach suction machine.   June 2023: The patient reports that she has had significant difficulty with plugging of her Montgomery cannula, having presented to the emergency room on 2 occasions in the last month.  Her cannula was removed cleaned and replaced by another laryngologist at [facility].  She is able to For several minutes/2 hours, however experiences breath stacking with a large egress of air when she removes the plug. [...] March 2024: In the interim since her last visit the patient reports that she has had a little more tightness of the neck anteriorly, and some intermittent discomfort.  Her respiratory and speaking symptoms are relatively the same.  She informs me that she would like to obtain additional information regarding the proposed tracheal resection surgery, and is considering postponing for another year  yet. 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  This preclude the standard approach that I had intended to take including tracheotomy below the site of repair due to the innominate interposition.  I informed the patient of this and told her that while I cannot, without preparation to perform the tracheal resection and pull up without thoracic surgery's help, and possible intrathoracic dissection, I will plan for endoscopy, and addressing the upper portion of her stenosis/subglottis/supra stomal collapse, potentially with an LTR borrowing cartilage from the larynx and interposing at the level of the subglottis/cricoid..  In the best case scenario this may afford her enough leeway to tolerate capping and decannulation without a larger tracheal resection surgery.  This would  be at two-part surgery with planned follow-up endoscopy and stent removal   February 16, 2024: the patient underwent open laryngotracheal plasty with left-sided thyroid  cartilage graft and stenting.  The patient was hospitalized and discharged on POD #4 in stable condition   April 2025: In the postoperative period, the patient continues to have anterior neck pain that requires her to.  Remained on narcotic pain medicine approximately 13 days after her surgery.  She reports that she is able to tolerate her Passy-Muir valve, but at times does get symptoms of air trapping requiring that she removes it.  She denies significant subjective shortness of breath or dysphagia.   March 11, 2024:: The patient underwent  MDL with granulation excision, bronchoscopy, with left neck incision for removal of the indwelling stent stitch.  Intraoperatively a fluid collection in the neck was drained   April 2025: In the interim the patient presented to the emergency room with red and swollen left neck from which was expressed purulence.  The patient was treated with IV antibiotics in the emergency room and sent home with oral antibiotic.  Neck cultures demonstrated 4+ MRSA/3+ Pseudomonas/4+ strep aeruginosa's.  MRSA susceptibility: Clindamycin/doxycycline /Bactrim /Pseudomonas susceptibility: Ceftazidime/ciprofloxacin/levofloxacillin.  Blood cultures: No growth.   Documentation supports a change of medication to ciprofloxacin/doxycycline  (March 18, 2024)   April 2025: Mr. Hoyt confirms the change of antibiotics that she has received them and reports that her neck has felt better thereafter.  She reports no significant shortness of breath nor dysphagia, though the voice is somewhat and breathy at this juncture.  Interval history includes decrease swelling and pain of the left neck.  The patient continues to have her complaint pain/discomfort of the right plantar surface of the right foot following her previous surgery.  She has  had podiatry consultation while in house, and has follow-up scheduled in June 2025.  Plan:Return operating room for dilation of reconstructed subglottic region/removal of granulation/trach change, with plan to attempt moving forward through decannulation protocol   July 2025: The patient reports some mild pain intermittently around the trach site.  She, for the most part reports that for the most part, she has been experiencing dysphonia in the mornings over the course the last 2 weeks.  Her voice does improve allowing her to have a usable audible voice.  The patient is approximately 3 months from her last trach change and planned for surgery with optimization of her subglottic airway in the next month.  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  She denies any significant dysphagia or shortness of breath with her #4 Shiley tracheotomy tube in place.  1. Persistent breath stacking with PM valve in place, suggesting limited airflow around #4 tracheotomy tube 2. #4 Shiley trach replaced in place  changed August 09, 2022 3. Evidence of airway stenosis based upon breath stacking, and outside CT read 4.  Consider scheduling of open airway surgery. 5.  Longstanding anemia  I have asked the patient to begin formal capping trials with her tracheotomy tube.  Her tube is relatively high in her subglottis, and there is evidence of some narrowing at the tracheotomy site.  That said the ability to successfully Would predict successful decannulation for this patient.  If that is not successful, I would consider further endoscopic management with  dilation of her airway in order to potentially achieve an adequate airway caliber.  Plan: 1.  Follow-up in 6 to 8 weeks for interval evaluation and consideration of decannulation versus return to the operating room for airway dilation.   This note was created with [employer] and may have errors that were not dictated and not seen in editing.   Kathy FELIX  Vega

## 2024-09-11 ENCOUNTER — Encounter: Payer: Self-pay | Admitting: Pulmonary Disease

## 2024-10-09 ENCOUNTER — Ambulatory Visit

## 2024-11-08 ENCOUNTER — Encounter: Payer: Self-pay | Admitting: Internal Medicine

## 2024-11-19 ENCOUNTER — Ambulatory Visit: Admitting: Cardiology

## 2024-12-03 ENCOUNTER — Ambulatory Visit

## 2024-12-03 ENCOUNTER — Ambulatory Visit: Payer: Self-pay | Admitting: Cardiology

## 2024-12-03 DIAGNOSIS — I420 Dilated cardiomyopathy: Secondary | ICD-10-CM | POA: Diagnosis not present

## 2024-12-03 LAB — ECHOCARDIOGRAM COMPLETE
AR max vel: 3.95 cm2
AV Area VTI: 3.7 cm2
AV Area mean vel: 3.85 cm2
AV Mean grad: 3 mmHg
AV Peak grad: 5.3 mmHg
Ao pk vel: 1.15 m/s
Area-P 1/2: 3.99 cm2
S' Lateral: 3.88 cm

## 2024-12-04 ENCOUNTER — Ambulatory Visit: Attending: Cardiology | Admitting: Cardiology

## 2024-12-04 NOTE — Progress Notes (Deleted)
 " Cardiology Office Note   Date:  12/04/2024  ID:  Khalia, Gong 14-Aug-1976, MRN [id number] PCP: [facility] Providers Cardiologist:  None { Click to update primary MD,subspecialty MD or APP then REFRESH:1}    History of Present Illness Kathy Vega is a 49 y.o. female with a past medical history of ARDS, dilated cardiomyopathy, chronic shortness of breath, peripheral edema, obesity, symptomatic anemia due to heavy menses, palpitations who is here today for follow-up.   Patient was hospitalized 07/2020 with pneumonia and heart secondary to COVID-19 requiring intubation.  Echo at that time showed EF 45 to 50% with global hypokinesis, severe concentric LVH, G1 DD with small pericardial effusion.  Follow-up echo 07/2021 with challenging images and LVEF estimated at 45 to 50% with global hypokinesis and mildly dilated LV cavity size with moderate LVH.  Patient established with Dr. Gollan 02/2022 and endorsed shortness of breath and irregular heartbeat.  She was started on Lasix  20 mg daily as needed for swelling and accompanying potassium supplementation.  Repeat echo revealed improved EF of 50 to 55% with no RWMA.  Event monitor revealed predominantly sinus rhythm with rare PVCs/PACs.  Patient was seen in follow-up 03/2023 and reported ongoing symptomatic palpitations.  She was started on bisoprolol  5 mg daily and propranolol  as needed for breakthrough palpitations.  She was last seen in clinic 08/20/2024 accompanied by her husband.  Unfortunately she had issues with her trach and no longer had her voice.  She continued to have chronic shortness of breath related to her trach.  She occasionally had palpitations and occasional swelling to her bilateral lower extremities.  She states she been compliant with her current medication regimen.  She was diagnosed with tracheal stenosis and had an upcoming procedure that was scheduled in October to resize her trach.    She returns to clinic today  ROS: 10 point review of system has been reviewed and considered negative exception was been listed in the HPI  Studies Reviewed      Cardiac Studies & Procedures   ______________________________________________________________________________________________     ECHOCARDIOGRAM  ECHOCARDIOGRAM COMPLETE 12/03/2024  Narrative ECHOCARDIOGRAM REPORT    Patient Name:   Kathy Vega Date of Exam: 12/03/2024 Medical Rec #:  [id number]    Height:       69.0 in Accession #:    [id number]   Weight:       287.4 lb Date of Birth:  07-Oct-1976   BSA:          2.410 m Patient Age:    49 years     BP:           140/100 mmHg Patient Gender: F            HR:           84 bpm. Exam Location:  [place]  Procedure: 2D Echo, Cardiac Doppler and Color Doppler (Both Spectral and Color Flow Doppler were utilized during procedure).  Indications:    I42.9 Cardiomyopathy (unspecified); R06.02 SOB  History:        Patient has prior history of Echocardiogram examinations. Cardiomyopathy; Signs/Symptoms:Shortness of Breath. Patient refused mulitple IV attempts, unable to use contrast.  Sonographer:    Marshall Benders Referring Phys: [id number] Betina Puckett   Sonographer Comments: Technically difficult study due to poor echo windows. Image acquisition challenging due to patient body habitus. IMPRESSIONS   1. Very technically difficult study. Patient refused echo contrast. 2. Left  ventricular ejection fraction, by estimation, is 50 to 55%. The left ventricle has low normal function. Left ventricular endocardial border not optimally defined to evaluate regional wall motion. The left ventricular internal cavity size was mildly dilated. There is mild left ventricular hypertrophy. Left ventricular diastolic parameters are consistent with Grade I diastolic dysfunction (impaired relaxation). 3. Right ventricular systolic function was not well visualized. The right ventricular size  is not well visualized. 4. A small pericardial effusion is present. 5. The mitral valve is normal in structure. No evidence of mitral valve regurgitation. No evidence of mitral stenosis. 6. The aortic valve has an indeterminant number of cusps. Aortic valve regurgitation is not visualized. No aortic stenosis is present. 7. The inferior vena cava is normal in size with greater than 50% respiratory variability, suggesting right atrial pressure of 3 mmHg.  Comparison(s): A prior study was performed on 03/30/2022. No significant change from prior study.  FINDINGS Left Ventricle: Left ventricular ejection fraction, by estimation, is 50 to 55%. The left ventricle has low normal function. Left ventricular endocardial border not optimally defined to evaluate regional wall motion. The left ventricular internal cavity size was mildly dilated. There is mild left ventricular hypertrophy. Left ventricular diastolic parameters are consistent with Grade I diastolic dysfunction (impaired relaxation).  Right Ventricle: The right ventricular size is not well visualized. Right vetricular wall thickness was not well visualized. Right ventricular systolic function was not well visualized.  Left Atrium: Left atrial size was normal in size.  Right Atrium: Right atrial size was normal in size.  Pericardium: A small pericardial effusion is present.  Mitral Valve: The mitral valve is normal in structure. No evidence of mitral valve regurgitation. No evidence of mitral valve stenosis.  Tricuspid Valve: The tricuspid valve is not well visualized. Tricuspid valve regurgitation is not demonstrated. No evidence of tricuspid stenosis.  Aortic Valve: The aortic valve has an indeterminant number of cusps. Aortic valve regurgitation is not visualized. No aortic stenosis is present. Aortic valve mean gradient measures 3.0 mmHg. Aortic valve peak gradient measures 5.3 mmHg. Aortic valve area, by VTI measures 3.70 cm.  Pulmonic  Valve: The pulmonic valve was not well visualized. Pulmonic valve regurgitation is trivial. No evidence of pulmonic stenosis.  Aorta: The aortic root and ascending aorta are structurally normal, with no evidence of dilitation.  Venous: The inferior vena cava is normal in size with greater than 50% respiratory variability, suggesting right atrial pressure of 3 mmHg.  IAS/Shunts: The interatrial septum was not well visualized.   LEFT VENTRICLE PLAX 2D LVIDd:         5.36 cm   Diastology LVIDs:         3.88 cm   LV e' medial:    6.09 cm/s LV PW:         1.21 cm   LV E/e' medial:  9.6 LV IVS:        1.31 cm   LV e' lateral:   5.87 cm/s LVOT diam:     2.50 cm   LV E/e' lateral: 10.0 LV SV:         81 LV SV Index:   34 LVOT Area:     4.91 cm   RIGHT VENTRICLE            IVC RV S prime:     9.14 cm/s  IVC diam: 1.94 cm TAPSE (M-mode): 1.8 cm  LEFT ATRIUM             Index  RIGHT ATRIUM           Index LA diam:        3.80 cm 1.58 cm/m   RA Area:     14.60 cm LA Vol (A2C):   32.9 ml 13.65 ml/m  RA Volume:   30.00 ml  12.45 ml/m LA Vol (A4C):   34.7 ml 14.40 ml/m LA Biplane Vol: 34.1 ml 14.15 ml/m AORTIC VALVE AV Area (Vmax):    3.95 cm AV Area (Vmean):   3.85 cm AV Area (VTI):     3.70 cm AV Vmax:           115.00 cm/s AV Vmean:          79.900 cm/s AV VTI:            0.220 m AV Peak Grad:      5.3 mmHg AV Mean Grad:      3.0 mmHg LVOT Vmax:         92.47 cm/s LVOT Vmean:        62.733 cm/s LVOT VTI:          0.166 m LVOT/AV VTI ratio: 0.75  AORTA Ao Root diam: 2.80 cm Ao Asc diam:  2.90 cm  MITRAL VALVE MV Area (PHT): 3.99 cm    SHUNTS MV Decel Time: 190 msec    Systemic VTI:  0.17 m MV E velocity: 58.70 cm/s  Systemic Diam: 2.50 cm MV A velocity: 77.60 cm/s MV E/A ratio:  0.76  [place] Electronically signed by Caron Poser Signature Date/Time: 12/03/2024/2:26:08 PM    Final    MONITORS  LONG TERM MONITOR (3-14 DAYS)  04/05/2022  Narrative Event monitor Patch Wear Time:  3 days and 3 hours (2023-04-19T09:51:28-0400 to 2023-04-22T13:14:24-0400)  Normal sinus rhythm Patient had a min HR of 56 bpm, max HR of 151 bpm, and avg HR of 94 bpm.  Isolated SVEs were rare (<1.0%), and no SVE Couplets or SVE Triplets were present. Isolated VEs were rare (<1.0%), VE Couplets were rare (<1.0%), and no VE Triplets were present.  Patient triggered events associated with normal sinus rhythm, rare PVC  Signed, Velinda Lunger, MD, Ph.D [facility]       ______________________________________________________________________________________________      Risk Assessment/Calculations   No BP recorded.  {Refresh Note OR Click here to enter BP  :1}***       Physical Exam VS:  There were no vitals taken for this visit.       Wt Readings from Last 3 Encounters:  08/22/24 287 lb 6.4 oz (130.4 kg)  08/20/24 287 lb (130.2 kg)  08/09/24 280 lb (127 kg)    GEN: Well nourished, well developed in no acute distress NECK: No JVD; No carotid bruits CARDIAC: ***RRR, no murmurs, rubs, gallops RESPIRATORY:  Clear to auscultation without rales, wheezing or rhonchi  ABDOMEN: Soft, non-tender, non-distended EXTREMITIES:  No edema; No deformity   ASSESSMENT AND PLAN HFimpEF/dilated cardiomyopathy Palpitations Chronic symptomatic anemia Morbid obesity    {Are you ordering a CV Procedure (e.g. stress test, cath, DCCV, TEE, etc)?   Press F2        :[id number]}  Dispo: ***  Signed, Glen Kesinger, NP   "

## 2024-12-30 ENCOUNTER — Ambulatory Visit: Admitting: Cardiology

## 2025-01-14 ENCOUNTER — Ambulatory Visit: Admitting: Cardiology

## 2025-01-22 ENCOUNTER — Ambulatory Visit: Admitting: Internal Medicine

## 2025-01-22 ENCOUNTER — Other Ambulatory Visit

## 2025-01-22 ENCOUNTER — Ambulatory Visit
